# Patient Record
Sex: Male | Born: 1950
Health system: Southern US, Community
[De-identification: ages and names within clinical notes are randomized; demographics above are authoritative.]

## PROBLEM LIST (undated history)

## (undated) DIAGNOSIS — K219 Gastro-esophageal reflux disease without esophagitis: Secondary | ICD-10-CM

## (undated) DIAGNOSIS — Z923 Personal history of irradiation: Secondary | ICD-10-CM

## (undated) DIAGNOSIS — J189 Pneumonia, unspecified organism: Secondary | ICD-10-CM

## (undated) DIAGNOSIS — F419 Anxiety disorder, unspecified: Secondary | ICD-10-CM

## (undated) DIAGNOSIS — M1990 Unspecified osteoarthritis, unspecified site: Secondary | ICD-10-CM

## (undated) DIAGNOSIS — D531 Other megaloblastic anemias, not elsewhere classified: Secondary | ICD-10-CM

## (undated) DIAGNOSIS — C801 Malignant (primary) neoplasm, unspecified: Secondary | ICD-10-CM

## (undated) DIAGNOSIS — R0602 Shortness of breath: Secondary | ICD-10-CM

## (undated) DIAGNOSIS — J439 Emphysema, unspecified: Secondary | ICD-10-CM

## (undated) DIAGNOSIS — Z9289 Personal history of other medical treatment: Secondary | ICD-10-CM

## (undated) DIAGNOSIS — R569 Unspecified convulsions: Secondary | ICD-10-CM

## (undated) DIAGNOSIS — R070 Pain in throat: Secondary | ICD-10-CM

## (undated) HISTORY — DX: Other megaloblastic anemias, not elsewhere classified: D53.1

## (undated) HISTORY — DX: Emphysema, unspecified: J43.9

## (undated) HISTORY — PX: ABDOMINAL HERNIA REPAIR: SHX539

## (undated) HISTORY — DX: Personal history of irradiation: Z92.3

## (undated) HISTORY — DX: Unspecified convulsions: R56.9

## (undated) HISTORY — PX: COLONOSCOPY: SHX174

## (undated) HISTORY — DX: Pain in throat: R07.0

## (undated) HISTORY — DX: Unspecified osteoarthritis, unspecified site: M19.90

## (undated) HISTORY — DX: Gastro-esophageal reflux disease without esophagitis: K21.9

---

## 1997-09-13 ENCOUNTER — Emergency Department (HOSPITAL_COMMUNITY): Admission: EM | Admit: 1997-09-13 | Discharge: 1997-09-13 | Payer: Self-pay | Admitting: *Deleted

## 1997-11-28 ENCOUNTER — Emergency Department (HOSPITAL_COMMUNITY): Admission: EM | Admit: 1997-11-28 | Discharge: 1997-11-28 | Payer: Self-pay | Admitting: Emergency Medicine

## 1999-11-17 ENCOUNTER — Encounter: Payer: Self-pay | Admitting: Emergency Medicine

## 1999-11-17 ENCOUNTER — Emergency Department (HOSPITAL_COMMUNITY): Admission: EM | Admit: 1999-11-17 | Discharge: 1999-11-17 | Payer: Self-pay | Admitting: Emergency Medicine

## 2000-06-06 ENCOUNTER — Encounter: Payer: Self-pay | Admitting: Emergency Medicine

## 2000-06-06 ENCOUNTER — Emergency Department (HOSPITAL_COMMUNITY): Admission: EM | Admit: 2000-06-06 | Discharge: 2000-06-06 | Payer: Self-pay | Admitting: Emergency Medicine

## 2001-02-14 ENCOUNTER — Emergency Department (HOSPITAL_COMMUNITY): Admission: EM | Admit: 2001-02-14 | Discharge: 2001-02-14 | Payer: Self-pay | Admitting: Emergency Medicine

## 2001-10-29 ENCOUNTER — Emergency Department (HOSPITAL_COMMUNITY): Admission: EM | Admit: 2001-10-29 | Discharge: 2001-10-29 | Payer: Self-pay | Admitting: Emergency Medicine

## 2002-06-27 ENCOUNTER — Emergency Department (HOSPITAL_COMMUNITY): Admission: EM | Admit: 2002-06-27 | Discharge: 2002-06-27 | Payer: Self-pay | Admitting: Emergency Medicine

## 2002-10-10 ENCOUNTER — Emergency Department (HOSPITAL_COMMUNITY): Admission: EM | Admit: 2002-10-10 | Discharge: 2002-10-11 | Payer: Self-pay | Admitting: Emergency Medicine

## 2003-08-13 ENCOUNTER — Encounter: Admission: RE | Admit: 2003-08-13 | Discharge: 2003-08-13 | Payer: Self-pay | Admitting: Family Medicine

## 2004-01-31 ENCOUNTER — Emergency Department (HOSPITAL_COMMUNITY): Admission: EM | Admit: 2004-01-31 | Discharge: 2004-01-31 | Payer: Self-pay | Admitting: Emergency Medicine

## 2004-07-26 ENCOUNTER — Ambulatory Visit: Payer: Self-pay | Admitting: Gastroenterology

## 2004-08-06 ENCOUNTER — Ambulatory Visit: Payer: Self-pay | Admitting: Gastroenterology

## 2004-10-22 ENCOUNTER — Emergency Department (HOSPITAL_COMMUNITY): Admission: EM | Admit: 2004-10-22 | Discharge: 2004-10-22 | Payer: Self-pay | Admitting: Emergency Medicine

## 2005-05-09 ENCOUNTER — Emergency Department (HOSPITAL_COMMUNITY): Admission: EM | Admit: 2005-05-09 | Discharge: 2005-05-09 | Payer: Self-pay | Admitting: Emergency Medicine

## 2005-05-12 ENCOUNTER — Emergency Department (HOSPITAL_COMMUNITY): Admission: EM | Admit: 2005-05-12 | Discharge: 2005-05-13 | Payer: Self-pay | Admitting: Emergency Medicine

## 2005-05-13 ENCOUNTER — Ambulatory Visit: Payer: Self-pay | Admitting: Psychiatry

## 2005-05-13 ENCOUNTER — Inpatient Hospital Stay (HOSPITAL_COMMUNITY): Admission: EM | Admit: 2005-05-13 | Discharge: 2005-05-17 | Payer: Self-pay | Admitting: Psychiatry

## 2005-05-16 ENCOUNTER — Emergency Department (HOSPITAL_COMMUNITY): Admission: EM | Admit: 2005-05-16 | Discharge: 2005-05-16 | Payer: Self-pay | Admitting: Emergency Medicine

## 2006-12-28 ENCOUNTER — Ambulatory Visit: Payer: Self-pay | Admitting: Hematology and Oncology

## 2007-01-24 LAB — CBC & DIFF AND RETIC
Basophils Absolute: 0 10*3/uL (ref 0.0–0.1)
Eosinophils Absolute: 0 10*3/uL (ref 0.0–0.5)
HGB: 9.3 g/dL — ABNORMAL LOW (ref 13.0–17.1)
LYMPH%: 33.5 % (ref 14.0–48.0)
MCV: 113.3 fL — ABNORMAL HIGH (ref 81.6–98.0)
MONO#: 0.6 10*3/uL (ref 0.1–0.9)
MONO%: 16.1 % — ABNORMAL HIGH (ref 0.0–13.0)
NEUT#: 1.9 10*3/uL (ref 1.5–6.5)
Platelets: 369 10*3/uL (ref 145–400)
RBC: 2.28 10*6/uL — ABNORMAL LOW (ref 4.20–5.71)
RDW: 16 % — ABNORMAL HIGH (ref 11.2–14.6)
RETIC #: 14.8 10*3/uL — ABNORMAL LOW (ref 31.8–103.9)
Retic %: 0.7 % (ref 0.7–2.3)
WBC: 3.8 10*3/uL — ABNORMAL LOW (ref 4.0–10.0)

## 2007-01-24 LAB — URINALYSIS, MICROSCOPIC - CHCC
Bilirubin (Urine): NEGATIVE
Ketones: NEGATIVE mg/dL
Protein: NEGATIVE mg/dL
Specific Gravity, Urine: 1.015 (ref 1.003–1.035)
pH: 6.5 (ref 4.6–8.0)

## 2007-01-26 LAB — COMPREHENSIVE METABOLIC PANEL
AST: 13 U/L (ref 0–37)
Albumin: 3.6 g/dL (ref 3.5–5.2)
Alkaline Phosphatase: 167 U/L — ABNORMAL HIGH (ref 39–117)
BUN: 16 mg/dL (ref 6–23)
Calcium: 8.7 mg/dL (ref 8.4–10.5)
Chloride: 110 mEq/L (ref 96–112)
Potassium: 4.9 mEq/L (ref 3.5–5.3)
Sodium: 145 mEq/L (ref 135–145)
Total Protein: 7.5 g/dL (ref 6.0–8.3)

## 2007-01-26 LAB — PROTEIN ELECTROPHORESIS, SERUM
Beta 2: 9.3 % — ABNORMAL HIGH (ref 3.2–6.5)
Beta Globulin: 4.9 % (ref 4.7–7.2)
Gamma Globulin: 18.5 % (ref 11.1–18.8)

## 2007-01-26 LAB — VITAMIN B12: Vitamin B-12: 320 pg/mL (ref 211–911)

## 2007-01-26 LAB — DIRECT ANTIGLOBULIN TEST (NOT AT ARMC)
DAT (Complement): NEGATIVE
DAT IgG: NEGATIVE

## 2007-01-26 LAB — FERRITIN: Ferritin: 431 ng/mL — ABNORMAL HIGH (ref 22–322)

## 2007-01-26 LAB — ERYTHROPOIETIN: Erythropoietin: 50.9 m[IU]/mL — ABNORMAL HIGH (ref 2.6–34.0)

## 2007-01-26 LAB — HEMOGLOBINOPATHY EVALUATION: Hgb F Quant: 0.4 % (ref 0.0–2.0)

## 2007-01-26 LAB — FOLATE: Folate: >20 ng/mL

## 2007-02-01 ENCOUNTER — Other Ambulatory Visit: Admission: RE | Admit: 2007-02-01 | Discharge: 2007-02-01 | Payer: Self-pay | Admitting: Hematology and Oncology

## 2007-02-01 ENCOUNTER — Encounter: Payer: Self-pay | Admitting: Hematology and Oncology

## 2007-02-13 ENCOUNTER — Ambulatory Visit: Payer: Self-pay | Admitting: Hematology and Oncology

## 2007-04-11 ENCOUNTER — Ambulatory Visit: Payer: Self-pay | Admitting: Hematology and Oncology

## 2007-05-08 LAB — CBC WITH DIFFERENTIAL/PLATELET
Basophils Absolute: 0 10*3/uL (ref 0.0–0.1)
Eosinophils Absolute: 0 10*3/uL (ref 0.0–0.5)
HGB: 10.4 g/dL — ABNORMAL LOW (ref 13.0–17.1)
LYMPH%: 25.1 % (ref 14.0–48.0)
MONO#: 0.6 10*3/uL (ref 0.1–0.9)
NEUT#: 2.7 10*3/uL (ref 1.5–6.5)
Platelets: 150 10*3/uL (ref 145–400)
RBC: 2.54 10*6/uL — ABNORMAL LOW (ref 4.20–5.71)
WBC: 4.6 10*3/uL (ref 4.0–10.0)

## 2007-05-08 LAB — COMPREHENSIVE METABOLIC PANEL
Albumin: 4.3 g/dL (ref 3.5–5.2)
BUN: 17 mg/dL (ref 6–23)
CO2: 22 mEq/L (ref 19–32)
Glucose, Bld: 180 mg/dL — ABNORMAL HIGH (ref 70–99)
Potassium: 4.1 mEq/L (ref 3.5–5.3)
Sodium: 137 mEq/L (ref 135–145)
Total Bilirubin: 0.6 mg/dL (ref 0.3–1.2)
Total Protein: 8.1 g/dL (ref 6.0–8.3)

## 2007-05-08 LAB — VITAMIN B12: Vitamin B-12: 420 pg/mL (ref 211–911)

## 2007-05-31 ENCOUNTER — Ambulatory Visit: Payer: Self-pay | Admitting: Hematology and Oncology

## 2007-06-05 LAB — CBC WITH DIFFERENTIAL/PLATELET
BASO%: 0 % (ref 0.0–2.0)
EOS%: 1.6 % (ref 0.0–7.0)
MCH: 40.5 pg — ABNORMAL HIGH (ref 28.0–33.4)
MCV: 115.9 fL — ABNORMAL HIGH (ref 81.6–98.0)
MONO%: 12 % (ref 0.0–13.0)
RBC: 2.7 10*6/uL — ABNORMAL LOW (ref 4.20–5.71)
RDW: 16.6 % — ABNORMAL HIGH (ref 11.2–14.6)
lymph#: 1 10*3/uL (ref 0.9–3.3)

## 2007-07-03 LAB — CBC WITH DIFFERENTIAL/PLATELET
Basophils Absolute: 0 10*3/uL (ref 0.0–0.1)
Eosinophils Absolute: 0 10*3/uL (ref 0.0–0.5)
HGB: 11.8 g/dL — ABNORMAL LOW (ref 13.0–17.1)
LYMPH%: 40.5 % (ref 14.0–48.0)
MCV: 115.1 fL — ABNORMAL HIGH (ref 81.6–98.0)
MONO#: 1 10*3/uL — ABNORMAL HIGH (ref 0.1–0.9)
MONO%: 29.8 % — ABNORMAL HIGH (ref 0.0–13.0)
NEUT#: 0.9 10*3/uL — ABNORMAL LOW (ref 1.5–6.5)
Platelets: 163 10*3/uL (ref 145–400)
RBC: 2.94 10*6/uL — ABNORMAL LOW (ref 4.20–5.71)
RDW: 15.7 % — ABNORMAL HIGH (ref 11.2–14.6)
WBC: 3.3 10*3/uL — ABNORMAL LOW (ref 4.0–10.0)

## 2007-07-10 LAB — CBC WITH DIFFERENTIAL/PLATELET
BASO%: 1.5 % (ref 0.0–2.0)
Basophils Absolute: 0.1 10e3/uL (ref 0.0–0.1)
EOS%: 1.7 % (ref 0.0–7.0)
Eosinophils Absolute: 0.1 10e3/uL (ref 0.0–0.5)
HCT: 36.7 % — ABNORMAL LOW (ref 38.7–49.9)
HGB: 12.9 g/dL — ABNORMAL LOW (ref 13.0–17.1)
LYMPH%: 42.8 % (ref 14.0–48.0)
MCH: 40.4 pg — ABNORMAL HIGH (ref 28.0–33.4)
MCHC: 35.3 g/dL (ref 32.0–35.9)
MCV: 114.7 fL — ABNORMAL HIGH (ref 81.6–98.0)
MONO#: 0.5 10e3/uL (ref 0.1–0.9)
MONO%: 11.8 % (ref 0.0–13.0)
NEUT#: 1.7 10e3/uL (ref 1.5–6.5)
NEUT%: 42.2 % (ref 40.0–75.0)
Platelets: 265 10e3/uL (ref 145–400)
RBC: 3.2 10e6/uL — ABNORMAL LOW (ref 4.20–5.71)
RDW: 15.8 % — ABNORMAL HIGH (ref 11.2–14.6)
WBC: 4 10e3/uL (ref 4.0–10.0)
lymph#: 1.7 10e3/uL (ref 0.9–3.3)

## 2007-07-27 ENCOUNTER — Ambulatory Visit: Payer: Self-pay | Admitting: Hematology and Oncology

## 2007-08-28 LAB — CBC WITH DIFFERENTIAL/PLATELET
BASO%: 0.6 % (ref 0.0–2.0)
EOS%: 2.9 % (ref 0.0–7.0)
HGB: 11.9 g/dL — ABNORMAL LOW (ref 13.0–17.1)
MCH: 40.6 pg — ABNORMAL HIGH (ref 28.0–33.4)
MCHC: 35.4 g/dL (ref 32.0–35.9)
MCV: 114.6 fL — ABNORMAL HIGH (ref 81.6–98.0)
MONO%: 16.5 % — ABNORMAL HIGH (ref 0.0–13.0)
RBC: 2.93 10*6/uL — ABNORMAL LOW (ref 4.20–5.71)
RDW: 16.4 % — ABNORMAL HIGH (ref 11.2–14.6)
lymph#: 1.6 10*3/uL (ref 0.9–3.3)

## 2007-09-21 ENCOUNTER — Ambulatory Visit: Payer: Self-pay | Admitting: Hematology and Oncology

## 2007-10-23 LAB — CBC WITH DIFFERENTIAL/PLATELET
BASO%: 0.2 % (ref 0.0–2.0)
Basophils Absolute: 0 10*3/uL (ref 0.0–0.1)
HCT: 29.3 % — ABNORMAL LOW (ref 38.7–49.9)
HGB: 10.2 g/dL — ABNORMAL LOW (ref 13.0–17.1)
LYMPH%: 27.8 % (ref 14.0–48.0)
MCH: 39.7 pg — ABNORMAL HIGH (ref 28.0–33.4)
MCHC: 35 g/dL (ref 32.0–35.9)
MONO#: 0.7 10*3/uL (ref 0.1–0.9)
NEUT%: 52.1 % (ref 40.0–75.0)
Platelets: 149 10*3/uL (ref 145–400)
WBC: 3.8 10*3/uL — ABNORMAL LOW (ref 4.0–10.0)

## 2007-10-23 LAB — BASIC METABOLIC PANEL
BUN: 19 mg/dL (ref 6–23)
CO2: 22 mEq/L (ref 19–32)
Calcium: 8.8 mg/dL (ref 8.4–10.5)
Creatinine, Ser: 0.85 mg/dL (ref 0.40–1.50)
Glucose, Bld: 98 mg/dL (ref 70–99)

## 2007-11-15 ENCOUNTER — Ambulatory Visit: Payer: Self-pay | Admitting: Hematology and Oncology

## 2007-11-20 LAB — CBC WITH DIFFERENTIAL/PLATELET
Eosinophils Absolute: 0.1 10*3/uL (ref 0.0–0.5)
HCT: 29.6 % — ABNORMAL LOW (ref 38.7–49.9)
LYMPH%: 18.1 % (ref 14.0–48.0)
MONO#: 0.6 10*3/uL (ref 0.1–0.9)
NEUT#: 3 10*3/uL (ref 1.5–6.5)
NEUT%: 66.4 % (ref 40.0–75.0)
Platelets: 158 10*3/uL (ref 145–400)
WBC: 4.5 10*3/uL (ref 4.0–10.0)

## 2008-01-09 ENCOUNTER — Ambulatory Visit: Payer: Self-pay | Admitting: Hematology and Oncology

## 2008-01-15 LAB — CBC WITH DIFFERENTIAL/PLATELET
BASO%: 1.8 % (ref 0.0–2.0)
Eosinophils Absolute: 0 10*3/uL (ref 0.0–0.5)
LYMPH%: 18 % (ref 14.0–48.0)
MCHC: 35.2 g/dL (ref 32.0–35.9)
MONO#: 0.7 10*3/uL (ref 0.1–0.9)
NEUT#: 2.6 10*3/uL (ref 1.5–6.5)
Platelets: 188 10*3/uL (ref 145–400)
RBC: 2.81 10*6/uL — ABNORMAL LOW (ref 4.20–5.71)
RDW: 17 % — ABNORMAL HIGH (ref 11.2–14.6)
WBC: 4.1 10*3/uL (ref 4.0–10.0)

## 2008-01-15 LAB — BASIC METABOLIC PANEL
CO2: 24 mEq/L (ref 19–32)
Calcium: 9 mg/dL (ref 8.4–10.5)
Glucose, Bld: 102 mg/dL — ABNORMAL HIGH (ref 70–99)
Potassium: 4.7 mEq/L (ref 3.5–5.3)
Sodium: 136 mEq/L (ref 135–145)

## 2008-02-12 LAB — CBC WITH DIFFERENTIAL/PLATELET
BASO%: 0.7 % (ref 0.0–2.0)
EOS%: 1.1 % (ref 0.0–7.0)
LYMPH%: 27.5 % (ref 14.0–48.0)
MCH: 42.5 pg — ABNORMAL HIGH (ref 28.0–33.4)
MCHC: 35.1 g/dL (ref 32.0–35.9)
MCV: 121.2 fL — ABNORMAL HIGH (ref 81.6–98.0)
MONO%: 16.6 % — ABNORMAL HIGH (ref 0.0–13.0)
NEUT#: 2.2 10*3/uL (ref 1.5–6.5)
Platelets: 194 10*3/uL (ref 145–400)
RBC: 2.76 10*6/uL — ABNORMAL LOW (ref 4.20–5.71)
RDW: 16.1 % — ABNORMAL HIGH (ref 11.2–14.6)

## 2008-03-07 ENCOUNTER — Ambulatory Visit: Payer: Self-pay | Admitting: Hematology and Oncology

## 2008-03-11 LAB — CBC WITH DIFFERENTIAL/PLATELET
Basophils Absolute: 0 10*3/uL (ref 0.0–0.1)
EOS%: 1.5 % (ref 0.0–7.0)
Eosinophils Absolute: 0.1 10*3/uL (ref 0.0–0.5)
HGB: 11.8 g/dL — ABNORMAL LOW (ref 13.0–17.1)
LYMPH%: 33.9 % (ref 14.0–48.0)
MCH: 42 pg — ABNORMAL HIGH (ref 28.0–33.4)
MCV: 119 fL — ABNORMAL HIGH (ref 81.6–98.0)
MONO%: 15.3 % — ABNORMAL HIGH (ref 0.0–13.0)
NEUT%: 49 % (ref 40.0–75.0)
Platelets: 175 10*3/uL (ref 145–400)
RDW: 16.1 % — ABNORMAL HIGH (ref 11.2–14.6)

## 2008-04-08 LAB — CBC WITH DIFFERENTIAL/PLATELET
Basophils Absolute: 0 10*3/uL (ref 0.0–0.1)
Eosinophils Absolute: 0.1 10*3/uL (ref 0.0–0.5)
HGB: 11.6 g/dL — ABNORMAL LOW (ref 13.0–17.1)
LYMPH%: 29.5 % (ref 14.0–48.0)
MCV: 118.4 fL — ABNORMAL HIGH (ref 81.6–98.0)
MONO%: 20.7 % — ABNORMAL HIGH (ref 0.0–13.0)
NEUT#: 1.5 10*3/uL (ref 1.5–6.5)
Platelets: 142 10*3/uL — ABNORMAL LOW (ref 145–400)

## 2008-04-08 LAB — BASIC METABOLIC PANEL
BUN: 26 mg/dL — ABNORMAL HIGH (ref 6–23)
CO2: 23 mEq/L (ref 19–32)
Calcium: 9.1 mg/dL (ref 8.4–10.5)
Creatinine, Ser: 0.88 mg/dL (ref 0.40–1.50)
Glucose, Bld: 101 mg/dL — ABNORMAL HIGH (ref 70–99)

## 2008-04-30 ENCOUNTER — Ambulatory Visit: Payer: Self-pay | Admitting: Hematology and Oncology

## 2008-05-06 LAB — CBC WITH DIFFERENTIAL/PLATELET
Basophils Absolute: 0 10*3/uL (ref 0.0–0.1)
EOS%: 0.9 % (ref 0.0–7.0)
Eosinophils Absolute: 0 10*3/uL (ref 0.0–0.5)
HCT: 32.5 % — ABNORMAL LOW (ref 38.7–49.9)
HGB: 11.4 g/dL — ABNORMAL LOW (ref 13.0–17.1)
MCH: 41.3 pg — ABNORMAL HIGH (ref 28.0–33.4)
MCV: 118.4 fL — ABNORMAL HIGH (ref 81.6–98.0)
NEUT#: 2.6 10*3/uL (ref 1.5–6.5)
NEUT%: 57.2 % (ref 40.0–75.0)
lymph#: 1.3 10*3/uL (ref 0.9–3.3)

## 2008-06-26 ENCOUNTER — Encounter: Admission: RE | Admit: 2008-06-26 | Discharge: 2008-06-26 | Payer: Self-pay | Admitting: Family Medicine

## 2008-06-27 ENCOUNTER — Ambulatory Visit: Payer: Self-pay | Admitting: Hematology and Oncology

## 2008-07-01 LAB — CBC WITH DIFFERENTIAL/PLATELET
Basophils Absolute: 0 10*3/uL (ref 0.0–0.1)
EOS%: 0.8 % (ref 0.0–7.0)
HCT: 27.3 % — ABNORMAL LOW (ref 38.4–49.9)
HGB: 9.4 g/dL — ABNORMAL LOW (ref 13.0–17.1)
LYMPH%: 31.4 % (ref 14.0–49.0)
MCH: 39.3 pg — ABNORMAL HIGH (ref 27.2–33.4)
MONO#: 0.5 10*3/uL (ref 0.1–0.9)
NEUT%: 52.7 % (ref 39.0–75.0)
Platelets: 265 10*3/uL (ref 140–400)
lymph#: 1.2 10*3/uL (ref 0.9–3.3)

## 2008-08-26 ENCOUNTER — Ambulatory Visit: Payer: Self-pay | Admitting: Hematology and Oncology

## 2008-08-28 LAB — CBC WITH DIFFERENTIAL/PLATELET
BASO%: 0.4 % (ref 0.0–2.0)
EOS%: 1.3 % (ref 0.0–7.0)
MCH: 42 pg — ABNORMAL HIGH (ref 27.2–33.4)
MCHC: 35.2 g/dL (ref 32.0–36.0)
MONO%: 15.1 % — ABNORMAL HIGH (ref 0.0–14.0)
NEUT%: 55.1 % (ref 39.0–75.0)
RDW: 16.6 % — ABNORMAL HIGH (ref 11.0–14.6)
lymph#: 1.3 10*3/uL (ref 0.9–3.3)

## 2008-08-28 LAB — BASIC METABOLIC PANEL
Chloride: 104 mEq/L (ref 96–112)
Creatinine, Ser: 0.69 mg/dL (ref 0.40–1.50)
Potassium: 4.4 mEq/L (ref 3.5–5.3)

## 2008-08-28 LAB — VITAMIN B12: Vitamin B-12: 444 pg/mL (ref 211–911)

## 2008-09-24 LAB — CBC WITH DIFFERENTIAL/PLATELET
BASO%: 0.7 % (ref 0.0–2.0)
EOS%: 2.2 % (ref 0.0–7.0)
HCT: 27.7 % — ABNORMAL LOW (ref 38.4–49.9)
LYMPH%: 21.2 % (ref 14.0–49.0)
MCH: 41.4 pg — ABNORMAL HIGH (ref 27.2–33.4)
MCHC: 34.2 g/dL (ref 32.0–36.0)
MCV: 121.1 fL — ABNORMAL HIGH (ref 79.3–98.0)
NEUT%: 63.7 % (ref 39.0–75.0)
Platelets: 202 10*3/uL (ref 140–400)

## 2008-10-20 ENCOUNTER — Ambulatory Visit: Payer: Self-pay | Admitting: Hematology and Oncology

## 2008-10-22 LAB — CBC WITH DIFFERENTIAL/PLATELET
Basophils Absolute: 0 10*3/uL (ref 0.0–0.1)
Eosinophils Absolute: 0.1 10*3/uL (ref 0.0–0.5)
LYMPH%: 26.7 % (ref 14.0–49.0)
MCV: 116 fL — ABNORMAL HIGH (ref 79.3–98.0)
MONO%: 14.2 % — ABNORMAL HIGH (ref 0.0–14.0)
NEUT#: 2.8 10*3/uL (ref 1.5–6.5)
Platelets: 169 10*3/uL (ref 140–400)
RBC: 2.38 10*6/uL — ABNORMAL LOW (ref 4.20–5.82)

## 2008-11-17 ENCOUNTER — Ambulatory Visit: Payer: Self-pay | Admitting: Hematology and Oncology

## 2008-11-19 LAB — CBC WITH DIFFERENTIAL/PLATELET
Basophils Absolute: 0 10*3/uL (ref 0.0–0.1)
Eosinophils Absolute: 0 10*3/uL (ref 0.0–0.5)
HGB: 8.8 g/dL — ABNORMAL LOW (ref 13.0–17.1)
MCV: 115.1 fL — ABNORMAL HIGH (ref 79.3–98.0)
MONO%: 14.1 % — ABNORMAL HIGH (ref 0.0–14.0)
NEUT#: 2 10*3/uL (ref 1.5–6.5)
Platelets: 131 10*3/uL — ABNORMAL LOW (ref 140–400)
RDW: 15.8 % — ABNORMAL HIGH (ref 11.0–14.6)

## 2008-12-15 ENCOUNTER — Ambulatory Visit: Payer: Self-pay | Admitting: Hematology and Oncology

## 2008-12-17 LAB — CBC WITH DIFFERENTIAL/PLATELET
Basophils Absolute: 0 10*3/uL (ref 0.0–0.1)
Eosinophils Absolute: 0 10*3/uL (ref 0.0–0.5)
HCT: 27.2 % — ABNORMAL LOW (ref 38.4–49.9)
HGB: 9.5 g/dL — ABNORMAL LOW (ref 13.0–17.1)
LYMPH%: 20.3 % (ref 14.0–49.0)
MCV: 121.3 fL — ABNORMAL HIGH (ref 79.3–98.0)
MONO%: 16.4 % — ABNORMAL HIGH (ref 0.0–14.0)
NEUT#: 2.4 10*3/uL (ref 1.5–6.5)
Platelets: 220 10*3/uL (ref 140–400)

## 2008-12-17 LAB — VITAMIN B12: Vitamin B-12: 989 pg/mL — ABNORMAL HIGH (ref 211–911)

## 2008-12-17 LAB — BASIC METABOLIC PANEL
BUN: 18 mg/dL (ref 6–23)
Calcium: 8.8 mg/dL (ref 8.4–10.5)
Glucose, Bld: 134 mg/dL — ABNORMAL HIGH (ref 70–99)
Potassium: 3.8 mEq/L (ref 3.5–5.3)

## 2009-01-14 ENCOUNTER — Ambulatory Visit: Payer: Self-pay | Admitting: Hematology and Oncology

## 2009-01-14 LAB — CBC WITH DIFFERENTIAL/PLATELET
Basophils Absolute: 0 10*3/uL (ref 0.0–0.1)
Eosinophils Absolute: 0.1 10*3/uL (ref 0.0–0.5)
HGB: 9.8 g/dL — ABNORMAL LOW (ref 13.0–17.1)
NEUT#: 2.4 10*3/uL (ref 1.5–6.5)
RDW: 16.1 % — ABNORMAL HIGH (ref 11.0–14.6)
lymph#: 1.8 10*3/uL (ref 0.9–3.3)

## 2009-02-10 LAB — CBC WITH DIFFERENTIAL/PLATELET
Eosinophils Absolute: 0.1 10*3/uL (ref 0.0–0.5)
MCV: 120.5 fL — ABNORMAL HIGH (ref 79.3–98.0)
MONO#: 0.7 10*3/uL (ref 0.1–0.9)
MONO%: 22.7 % — ABNORMAL HIGH (ref 0.0–14.0)
NEUT#: 0.8 10*3/uL — ABNORMAL LOW (ref 1.5–6.5)
RBC: 2.24 10*6/uL — ABNORMAL LOW (ref 4.20–5.82)
RDW: 15.8 % — ABNORMAL HIGH (ref 11.0–14.6)
WBC: 2.9 10*3/uL — ABNORMAL LOW (ref 4.0–10.3)
lymph#: 1.4 10*3/uL (ref 0.9–3.3)
nRBC: 0 % (ref 0–0)

## 2009-02-13 ENCOUNTER — Ambulatory Visit: Payer: Self-pay | Admitting: Oncology

## 2009-02-17 LAB — CBC WITH DIFFERENTIAL/PLATELET
BASO%: 0.6 % (ref 0.0–2.0)
Basophils Absolute: 0 10*3/uL (ref 0.0–0.1)
EOS%: 0.9 % (ref 0.0–7.0)
Eosinophils Absolute: 0 10*3/uL (ref 0.0–0.5)
HCT: 27.7 % — ABNORMAL LOW (ref 38.4–49.9)
HGB: 9.6 g/dL — ABNORMAL LOW (ref 13.0–17.1)
LYMPH%: 38.5 % (ref 14.0–49.0)
MCH: 41.2 pg — ABNORMAL HIGH (ref 27.2–33.4)
MCHC: 34.7 g/dL (ref 32.0–36.0)
MCV: 118.9 fL — ABNORMAL HIGH (ref 79.3–98.0)
MONO#: 0.5 10*3/uL (ref 0.1–0.9)
MONO%: 15.5 % — ABNORMAL HIGH (ref 0.0–14.0)
NEUT#: 1.5 10*3/uL (ref 1.5–6.5)
NEUT%: 44.5 % (ref 39.0–75.0)
Platelets: 151 10*3/uL (ref 140–400)
RBC: 2.33 10*6/uL — ABNORMAL LOW (ref 4.20–5.82)
RDW: 15.4 % — ABNORMAL HIGH (ref 11.0–14.6)
WBC: 3.4 10*3/uL — ABNORMAL LOW (ref 4.0–10.3)
lymph#: 1.3 10*3/uL (ref 0.9–3.3)
nRBC: 0 % (ref 0–0)

## 2009-03-10 LAB — CBC WITH DIFFERENTIAL/PLATELET
BASO%: 0.9 % (ref 0.0–2.0)
EOS%: 1.5 % (ref 0.0–7.0)
MCH: 41.7 pg — ABNORMAL HIGH (ref 27.2–33.4)
MCHC: 34.5 g/dL (ref 32.0–36.0)
MCV: 121.1 fL — ABNORMAL HIGH (ref 79.3–98.0)
MONO%: 25 % — ABNORMAL HIGH (ref 0.0–14.0)
RBC: 2.42 10*6/uL — ABNORMAL LOW (ref 4.20–5.82)
RDW: 15.7 % — ABNORMAL HIGH (ref 11.0–14.6)
lymph#: 1.5 10*3/uL (ref 0.9–3.3)

## 2009-04-03 ENCOUNTER — Ambulatory Visit: Payer: Self-pay | Admitting: Oncology

## 2009-04-09 LAB — CBC WITH DIFFERENTIAL/PLATELET
BASO%: 0.3 % (ref 0.0–2.0)
Eosinophils Absolute: 0.1 10*3/uL (ref 0.0–0.5)
HCT: 29.1 % — ABNORMAL LOW (ref 38.4–49.9)
LYMPH%: 40.9 % (ref 14.0–49.0)
MCHC: 34.4 g/dL (ref 32.0–36.0)
MCV: 120.2 fL — ABNORMAL HIGH (ref 79.3–98.0)
MONO#: 0.9 10*3/uL (ref 0.1–0.9)
MONO%: 24.6 % — ABNORMAL HIGH (ref 0.0–14.0)
NEUT%: 32.8 % — ABNORMAL LOW (ref 39.0–75.0)
Platelets: 185 10*3/uL (ref 140–400)
RBC: 2.42 10*6/uL — ABNORMAL LOW (ref 4.20–5.82)
WBC: 3.6 10*3/uL — ABNORMAL LOW (ref 4.0–10.3)

## 2009-04-30 ENCOUNTER — Ambulatory Visit: Payer: Self-pay | Admitting: Oncology

## 2009-05-01 ENCOUNTER — Emergency Department (HOSPITAL_COMMUNITY): Admission: EM | Admit: 2009-05-01 | Discharge: 2009-05-01 | Payer: Self-pay | Admitting: Emergency Medicine

## 2009-05-05 LAB — CBC WITH DIFFERENTIAL/PLATELET
BASO%: 0.3 % (ref 0.0–2.0)
EOS%: 1.8 % (ref 0.0–7.0)
HCT: 28.3 % — ABNORMAL LOW (ref 38.4–49.9)
LYMPH%: 43.2 % (ref 14.0–49.0)
MCH: 41.3 pg — ABNORMAL HIGH (ref 27.2–33.4)
MCHC: 34.3 g/dL (ref 32.0–36.0)
NEUT%: 38.8 % — ABNORMAL LOW (ref 39.0–75.0)
Platelets: 123 10*3/uL — ABNORMAL LOW (ref 140–400)
RBC: 2.35 10*6/uL — ABNORMAL LOW (ref 4.20–5.82)
WBC: 3.3 10*3/uL — ABNORMAL LOW (ref 4.0–10.3)
nRBC: 0 % (ref 0–0)

## 2009-06-02 ENCOUNTER — Ambulatory Visit: Payer: Self-pay | Admitting: Oncology

## 2009-06-02 LAB — CBC WITH DIFFERENTIAL/PLATELET
Basophils Absolute: 0 10*3/uL (ref 0.0–0.1)
Eosinophils Absolute: 0.1 10*3/uL (ref 0.0–0.5)
HCT: 26.7 % — ABNORMAL LOW (ref 38.4–49.9)
HGB: 9 g/dL — ABNORMAL LOW (ref 13.0–17.1)
LYMPH%: 32.2 % (ref 14.0–49.0)
MCV: 121.9 fL — ABNORMAL HIGH (ref 79.3–98.0)
MONO#: 0.8 10*3/uL (ref 0.1–0.9)
MONO%: 17.9 % — ABNORMAL HIGH (ref 0.0–14.0)
NEUT#: 2 10*3/uL (ref 1.5–6.5)
NEUT%: 47.8 % (ref 39.0–75.0)
WBC: 4.3 10*3/uL (ref 4.0–10.3)
lymph#: 1.4 10*3/uL (ref 0.9–3.3)

## 2009-06-17 ENCOUNTER — Encounter (INDEPENDENT_AMBULATORY_CARE_PROVIDER_SITE_OTHER): Payer: Self-pay | Admitting: *Deleted

## 2009-06-30 ENCOUNTER — Encounter: Payer: Self-pay | Admitting: Gastroenterology

## 2009-06-30 LAB — CBC WITH DIFFERENTIAL/PLATELET
BASO%: 0.5 % (ref 0.0–2.0)
Basophils Absolute: 0 10*3/uL (ref 0.0–0.1)
Eosinophils Absolute: 0 10*3/uL (ref 0.0–0.5)
HGB: 8.5 g/dL — ABNORMAL LOW (ref 13.0–17.1)
LYMPH%: 31.4 % (ref 14.0–49.0)
MCV: 126.5 fL — ABNORMAL HIGH (ref 79.3–98.0)
MONO#: 0.6 10*3/uL (ref 0.1–0.9)
RDW: 17.4 % — ABNORMAL HIGH (ref 11.0–14.6)
WBC: 3.9 10*3/uL — ABNORMAL LOW (ref 4.0–10.3)
lymph#: 1.2 10*3/uL (ref 0.9–3.3)

## 2009-06-30 LAB — VITAMIN B12: Vitamin B-12: 687 pg/mL (ref 211–911)

## 2009-06-30 LAB — BASIC METABOLIC PANEL
Calcium: 8.8 mg/dL (ref 8.4–10.5)
Creatinine, Ser: 0.82 mg/dL (ref 0.40–1.50)
Glucose, Bld: 93 mg/dL (ref 70–99)

## 2009-07-07 ENCOUNTER — Encounter (INDEPENDENT_AMBULATORY_CARE_PROVIDER_SITE_OTHER): Payer: Self-pay | Admitting: *Deleted

## 2009-07-17 ENCOUNTER — Ambulatory Visit: Payer: Self-pay | Admitting: Oncology

## 2009-07-21 ENCOUNTER — Encounter (HOSPITAL_COMMUNITY): Admission: RE | Admit: 2009-07-21 | Discharge: 2009-10-19 | Payer: Self-pay | Admitting: Hematology and Oncology

## 2009-07-21 LAB — CBC WITH DIFFERENTIAL/PLATELET
BASO%: 0.6 % (ref 0.0–2.0)
Basophils Absolute: 0 10*3/uL (ref 0.0–0.1)
Eosinophils Absolute: 0 10*3/uL (ref 0.0–0.5)
HGB: 7.7 g/dL — ABNORMAL LOW (ref 13.0–17.1)
MCH: 41.6 pg — ABNORMAL HIGH (ref 27.2–33.4)
MCHC: 33.9 g/dL (ref 32.0–36.0)
MCV: 122.7 fL — ABNORMAL HIGH (ref 79.3–98.0)
MONO#: 0.7 10*3/uL (ref 0.1–0.9)
MONO%: 20.6 % — ABNORMAL HIGH (ref 0.0–14.0)
NEUT#: 1.4 10*3/uL — ABNORMAL LOW (ref 1.5–6.5)
NEUT%: 40.2 % (ref 39.0–75.0)
RBC: 1.85 10*6/uL — ABNORMAL LOW (ref 4.20–5.82)
lymph#: 1.3 10*3/uL (ref 0.9–3.3)
nRBC: 0 % (ref 0–0)

## 2009-07-21 LAB — IRON AND TIBC
%SAT: 40 % (ref 20–55)
TIBC: 221 ug/dL (ref 215–435)
UIBC: 133 ug/dL

## 2009-07-22 LAB — TYPE & CROSSMATCH - CHCC

## 2009-08-07 ENCOUNTER — Encounter (INDEPENDENT_AMBULATORY_CARE_PROVIDER_SITE_OTHER): Payer: Self-pay | Admitting: *Deleted

## 2009-08-11 ENCOUNTER — Ambulatory Visit: Payer: Self-pay | Admitting: Gastroenterology

## 2009-08-11 LAB — CBC WITH DIFFERENTIAL/PLATELET
BASO%: 0.3 % (ref 0.0–2.0)
Basophils Absolute: 0 10*3/uL (ref 0.0–0.1)
EOS%: 0.1 % (ref 0.0–7.0)
Eosinophils Absolute: 0 10*3/uL (ref 0.0–0.5)
HCT: 31.9 % — ABNORMAL LOW (ref 38.4–49.9)
HGB: 11.1 g/dL — ABNORMAL LOW (ref 13.0–17.1)
MCH: 40.7 pg — ABNORMAL HIGH (ref 27.2–33.4)
MCHC: 34.7 g/dL (ref 32.0–36.0)
MCV: 117.2 fL — ABNORMAL HIGH (ref 79.3–98.0)
MONO%: 10.8 % (ref 0.0–14.0)
Platelets: 186 10*3/uL (ref 140–400)
RBC: 2.72 10*6/uL — ABNORMAL LOW (ref 4.20–5.82)
RDW: 27.2 % — ABNORMAL HIGH (ref 11.0–14.6)
WBC: 8.1 10*3/uL (ref 4.0–10.3)

## 2009-08-21 ENCOUNTER — Ambulatory Visit: Payer: Self-pay | Admitting: Oncology

## 2009-08-25 ENCOUNTER — Ambulatory Visit: Payer: Self-pay | Admitting: Gastroenterology

## 2009-08-26 ENCOUNTER — Encounter: Payer: Self-pay | Admitting: Gastroenterology

## 2009-09-01 LAB — CBC WITH DIFFERENTIAL/PLATELET
Basophils Absolute: 0 10*3/uL (ref 0.0–0.1)
Eosinophils Absolute: 0.1 10*3/uL (ref 0.0–0.5)
HCT: 30 % — ABNORMAL LOW (ref 38.4–49.9)
HGB: 10.3 g/dL — ABNORMAL LOW (ref 13.0–17.1)
LYMPH%: 17.9 % (ref 14.0–49.0)
MONO#: 1.2 10*3/uL — ABNORMAL HIGH (ref 0.1–0.9)
NEUT#: 4.4 10*3/uL (ref 1.5–6.5)
RBC: 2.65 10*6/uL — ABNORMAL LOW (ref 4.20–5.82)

## 2009-09-21 ENCOUNTER — Ambulatory Visit: Payer: Self-pay | Admitting: Hematology and Oncology

## 2009-09-22 LAB — CBC WITH DIFFERENTIAL/PLATELET
EOS%: 0.5 % (ref 0.0–7.0)
Eosinophils Absolute: 0 10*3/uL (ref 0.0–0.5)
MCH: 42 pg — ABNORMAL HIGH (ref 27.2–33.4)
MCHC: 35.4 g/dL (ref 32.0–36.0)
MONO#: 1 10*3/uL — ABNORMAL HIGH (ref 0.1–0.9)
NEUT#: 3.4 10*3/uL (ref 1.5–6.5)
RBC: 2.33 10*6/uL — ABNORMAL LOW (ref 4.20–5.82)
RDW: 24.4 % — ABNORMAL HIGH (ref 11.0–14.6)

## 2009-10-13 ENCOUNTER — Encounter: Payer: Self-pay | Admitting: Gastroenterology

## 2009-10-13 LAB — CBC WITH DIFFERENTIAL/PLATELET
BASO%: 0.2 % (ref 0.0–2.0)
HCT: 26 % — ABNORMAL LOW (ref 38.4–49.9)
HGB: 9.2 g/dL — ABNORMAL LOW (ref 13.0–17.1)
MCH: 43.3 pg — ABNORMAL HIGH (ref 27.2–33.4)
MCHC: 35.2 g/dL (ref 32.0–36.0)
NEUT%: 60.1 % (ref 39.0–75.0)
Platelets: 220 10*3/uL (ref 140–400)
RDW: 20.8 % — ABNORMAL HIGH (ref 11.0–14.6)
WBC: 4.4 10*3/uL (ref 4.0–10.3)

## 2009-10-13 LAB — COMPREHENSIVE METABOLIC PANEL
AST: 12 U/L (ref 0–37)
Albumin: 2.9 g/dL — ABNORMAL LOW (ref 3.5–5.2)
Alkaline Phosphatase: 214 U/L — ABNORMAL HIGH (ref 39–117)
BUN: 16 mg/dL (ref 6–23)
Calcium: 8.3 mg/dL — ABNORMAL LOW (ref 8.4–10.5)
Glucose, Bld: 98 mg/dL (ref 70–99)
Potassium: 3.7 mEq/L (ref 3.5–5.3)
Sodium: 136 mEq/L (ref 135–145)
Total Protein: 7.9 g/dL (ref 6.0–8.3)

## 2009-11-03 ENCOUNTER — Ambulatory Visit: Payer: Self-pay | Admitting: Hematology and Oncology

## 2009-11-03 LAB — CBC WITH DIFFERENTIAL/PLATELET
Basophils Absolute: 0 10*3/uL (ref 0.0–0.1)
EOS%: 0.8 % (ref 0.0–7.0)
MCV: 121.9 fL — ABNORMAL HIGH (ref 79.3–98.0)
MONO#: 1.2 10*3/uL — ABNORMAL HIGH (ref 0.1–0.9)
NEUT#: 2.2 10*3/uL (ref 1.5–6.5)
NEUT%: 45 % (ref 39.0–75.0)
RBC: 2.15 10*6/uL — ABNORMAL LOW (ref 4.20–5.82)
RDW: 16.4 % — ABNORMAL HIGH (ref 11.0–14.6)
WBC: 5 10*3/uL (ref 4.0–10.3)
lymph#: 1.4 10*3/uL (ref 0.9–3.3)

## 2009-11-24 LAB — CBC WITH DIFFERENTIAL/PLATELET
Basophils Absolute: 0 10*3/uL (ref 0.0–0.1)
Eosinophils Absolute: 0 10*3/uL (ref 0.0–0.5)
NEUT#: 1.7 10*3/uL (ref 1.5–6.5)
NEUT%: 45.6 % (ref 39.0–75.0)
RBC: 2.06 10*6/uL — ABNORMAL LOW (ref 4.20–5.82)
RDW: 15.6 % — ABNORMAL HIGH (ref 11.0–14.6)
WBC: 3.8 10*3/uL — ABNORMAL LOW (ref 4.0–10.3)
lymph#: 1.1 10*3/uL (ref 0.9–3.3)

## 2009-12-11 ENCOUNTER — Ambulatory Visit: Payer: Self-pay | Admitting: Hematology and Oncology

## 2009-12-15 LAB — CBC WITH DIFFERENTIAL/PLATELET
BASO%: 0.3 % (ref 0.0–2.0)
Eosinophils Absolute: 0 10*3/uL (ref 0.0–0.5)
HCT: 27.3 % — ABNORMAL LOW (ref 38.4–49.9)
LYMPH%: 21.9 % (ref 14.0–49.0)
MCH: 45.1 pg — ABNORMAL HIGH (ref 27.2–33.4)
MCV: 125.9 fL — ABNORMAL HIGH (ref 79.3–98.0)
MONO#: 0.5 10*3/uL (ref 0.1–0.9)
NEUT#: 3.6 10*3/uL (ref 1.5–6.5)
Platelets: 284 10*3/uL (ref 140–400)
RDW: 16.2 % — ABNORMAL HIGH (ref 11.0–14.6)

## 2010-01-05 LAB — CBC WITH DIFFERENTIAL/PLATELET
BASO%: 0.6 % (ref 0.0–2.0)
EOS%: 0.6 % (ref 0.0–7.0)
Eosinophils Absolute: 0 10*3/uL (ref 0.0–0.5)
HCT: 27.8 % — ABNORMAL LOW (ref 38.4–49.9)
HGB: 9.5 g/dL — ABNORMAL LOW (ref 13.0–17.1)
MCV: 123.8 fL — ABNORMAL HIGH (ref 79.3–98.0)
MONO#: 0.7 10*3/uL (ref 0.1–0.9)
MONO%: 11.9 % (ref 0.0–14.0)
NEUT#: 3.9 10*3/uL (ref 1.5–6.5)
WBC: 5.7 10*3/uL (ref 4.0–10.3)
lymph#: 1.1 10*3/uL (ref 0.9–3.3)

## 2010-01-12 ENCOUNTER — Ambulatory Visit: Payer: Self-pay | Admitting: Hematology and Oncology

## 2010-01-12 LAB — CBC WITH DIFFERENTIAL/PLATELET
BASO%: 0.5 % (ref 0.0–2.0)
Basophils Absolute: 0 10*3/uL (ref 0.0–0.1)
EOS%: 0.8 % (ref 0.0–7.0)
HCT: 26.3 % — ABNORMAL LOW (ref 38.4–49.9)
HGB: 8.9 g/dL — ABNORMAL LOW (ref 13.0–17.1)
MCH: 39.7 pg — ABNORMAL HIGH (ref 27.2–33.4)
MCV: 117.4 fL — ABNORMAL HIGH (ref 79.3–98.0)
MONO%: 16.8 % — ABNORMAL HIGH (ref 0.0–14.0)
NEUT%: 57.7 % (ref 39.0–75.0)

## 2010-01-12 LAB — BASIC METABOLIC PANEL
Calcium: 8.4 mg/dL (ref 8.4–10.5)
Glucose, Bld: 116 mg/dL — ABNORMAL HIGH (ref 70–99)
Sodium: 129 mEq/L — ABNORMAL LOW (ref 135–145)

## 2010-01-21 ENCOUNTER — Encounter: Payer: Self-pay | Admitting: Gastroenterology

## 2010-01-21 LAB — CBC WITH DIFFERENTIAL/PLATELET
Basophils Absolute: 0 10*3/uL (ref 0.0–0.1)
Eosinophils Absolute: 0 10*3/uL (ref 0.0–0.5)
HCT: 23.6 % — ABNORMAL LOW (ref 38.4–49.9)
HGB: 7.9 g/dL — ABNORMAL LOW (ref 13.0–17.1)
LYMPH%: 26.6 % (ref 14.0–49.0)
MONO%: 23.4 % — ABNORMAL HIGH (ref 0.0–14.0)
RBC: 1.96 10*6/uL — ABNORMAL LOW (ref 4.20–5.82)
WBC: 4.1 10*3/uL (ref 4.0–10.3)

## 2010-01-21 LAB — IRON AND TIBC: TIBC: 184 ug/dL — ABNORMAL LOW (ref 215–435)

## 2010-01-21 LAB — VITAMIN B12: Vitamin B-12: 1344 pg/mL — ABNORMAL HIGH (ref 211–911)

## 2010-01-26 LAB — CBC WITH DIFFERENTIAL/PLATELET
Basophils Absolute: 0 10*3/uL (ref 0.0–0.1)
EOS%: 0.7 % (ref 0.0–7.0)
Eosinophils Absolute: 0 10*3/uL (ref 0.0–0.5)
HCT: 25.3 % — ABNORMAL LOW (ref 38.4–49.9)
MONO#: 0.6 10*3/uL (ref 0.1–0.9)
MONO%: 13.8 % (ref 0.0–14.0)
NEUT#: 2.3 10*3/uL (ref 1.5–6.5)
Platelets: 287 10*3/uL (ref 140–400)
RDW: 15.1 % — ABNORMAL HIGH (ref 11.0–14.6)

## 2010-02-12 ENCOUNTER — Ambulatory Visit: Payer: Self-pay | Admitting: Hematology and Oncology

## 2010-02-16 LAB — CBC WITH DIFFERENTIAL/PLATELET
BASO%: 0.5 % (ref 0.0–2.0)
EOS%: 1.4 % (ref 0.0–7.0)
HGB: 8.9 g/dL — ABNORMAL LOW (ref 13.0–17.1)
MCH: 39.2 pg — ABNORMAL HIGH (ref 27.2–33.4)
MCHC: 33.5 g/dL (ref 32.0–36.0)
NEUT#: 3.1 10*3/uL (ref 1.5–6.5)
NEUT%: 55.3 % (ref 39.0–75.0)
Platelets: 218 10*3/uL (ref 140–400)
RBC: 2.27 10*6/uL — ABNORMAL LOW (ref 4.20–5.82)
RDW: 16.1 % — ABNORMAL HIGH (ref 11.0–14.6)
lymph#: 1.6 10*3/uL (ref 0.9–3.3)
nRBC: 0 % (ref 0–0)

## 2010-02-16 LAB — VITAMIN B12: Vitamin B-12: 659 pg/mL (ref 211–911)

## 2010-03-10 ENCOUNTER — Encounter: Payer: Self-pay | Admitting: Gastroenterology

## 2010-03-10 LAB — CBC WITH DIFFERENTIAL/PLATELET
Basophils Absolute: 0 10*3/uL (ref 0.0–0.1)
EOS%: 1.7 % (ref 0.0–7.0)
Eosinophils Absolute: 0.1 10*3/uL (ref 0.0–0.5)
HGB: 8.4 g/dL — ABNORMAL LOW (ref 13.0–17.1)
LYMPH%: 34.2 % (ref 14.0–49.0)
MONO#: 0.6 10*3/uL (ref 0.1–0.9)
MONO%: 11.7 % (ref 0.0–14.0)
NEUT#: 2.5 10*3/uL (ref 1.5–6.5)
WBC: 4.8 10*3/uL (ref 4.0–10.3)
lymph#: 1.6 10*3/uL (ref 0.9–3.3)

## 2010-03-10 LAB — BASIC METABOLIC PANEL
BUN: 11 mg/dL (ref 6–23)
CO2: 20 mEq/L (ref 19–32)
Calcium: 8.8 mg/dL (ref 8.4–10.5)
Chloride: 107 mEq/L (ref 96–112)

## 2010-03-29 ENCOUNTER — Ambulatory Visit: Payer: Self-pay | Admitting: Hematology and Oncology

## 2010-03-30 LAB — CBC WITH DIFFERENTIAL/PLATELET
BASO%: 0.2 % (ref 0.0–2.0)
Basophils Absolute: 0 10*3/uL (ref 0.0–0.1)
Eosinophils Absolute: 0 10*3/uL (ref 0.0–0.5)
MCV: 117.1 fL — ABNORMAL HIGH (ref 79.3–98.0)
MONO#: 0.7 10*3/uL (ref 0.1–0.9)
NEUT#: 4.3 10*3/uL (ref 1.5–6.5)
RBC: 2.46 10*6/uL — ABNORMAL LOW (ref 4.20–5.82)
RDW: 17.7 % — ABNORMAL HIGH (ref 11.0–14.6)
WBC: 6.1 10*3/uL (ref 4.0–10.3)

## 2010-04-20 LAB — CBC WITH DIFFERENTIAL/PLATELET
EOS%: 1.4 % (ref 0.0–7.0)
HCT: 26.6 % — ABNORMAL LOW (ref 38.4–49.9)
HGB: 9.2 g/dL — ABNORMAL LOW (ref 13.0–17.1)
MCV: 118.9 fL — ABNORMAL HIGH (ref 79.3–98.0)
MONO%: 12.9 % (ref 0.0–14.0)
NEUT#: 1.8 10*3/uL (ref 1.5–6.5)
NEUT%: 45.1 % (ref 39.0–75.0)
Platelets: 233 10*3/uL (ref 140–400)
RBC: 2.24 10*6/uL — ABNORMAL LOW (ref 4.20–5.82)
WBC: 3.9 10*3/uL — ABNORMAL LOW (ref 4.0–10.3)

## 2010-05-05 ENCOUNTER — Ambulatory Visit: Payer: Self-pay | Admitting: Hematology and Oncology

## 2010-05-11 ENCOUNTER — Encounter: Payer: Self-pay | Admitting: Gastroenterology

## 2010-05-11 ENCOUNTER — Ambulatory Visit: Payer: Self-pay | Admitting: Hematology and Oncology

## 2010-05-11 LAB — CBC WITH DIFFERENTIAL/PLATELET
BASO%: 0.6 % (ref 0.0–2.0)
EOS%: 0.8 % (ref 0.0–7.0)
HCT: 25.9 % — ABNORMAL LOW (ref 38.4–49.9)
MCH: 41.4 pg — ABNORMAL HIGH (ref 27.2–33.4)
NEUT#: 2.7 10*3/uL (ref 1.5–6.5)
Platelets: 199 10*3/uL (ref 140–400)
RBC: 2.23 10*6/uL — ABNORMAL LOW (ref 4.20–5.82)
RDW: 17 % — ABNORMAL HIGH (ref 11.0–14.6)
WBC: 4.4 10*3/uL (ref 4.0–10.3)

## 2010-05-11 LAB — VITAMIN B12: Vitamin B-12: 708 pg/mL (ref 211–911)

## 2010-05-11 LAB — BASIC METABOLIC PANEL
BUN: 15 mg/dL (ref 6–23)
CO2: 20 mEq/L (ref 19–32)
Calcium: 9.1 mg/dL (ref 8.4–10.5)
Chloride: 105 mEq/L (ref 96–112)
Creatinine, Ser: 0.84 mg/dL (ref 0.40–1.50)
Glucose, Bld: 104 mg/dL — ABNORMAL HIGH (ref 70–99)
Potassium: 3.8 mEq/L (ref 3.5–5.3)
Sodium: 136 mEq/L (ref 135–145)

## 2010-06-01 LAB — CBC WITH DIFFERENTIAL/PLATELET
BASO%: 0.5 % (ref 0.0–2.0)
MCHC: 33.9 g/dL (ref 32.0–36.0)
MCV: 116.3 fL — ABNORMAL HIGH (ref 79.3–98.0)
MONO#: 0.5 10*3/uL (ref 0.1–0.9)
NEUT#: 2 10*3/uL (ref 1.5–6.5)
NEUT%: 52.1 % (ref 39.0–75.0)
Platelets: 243 10*3/uL (ref 140–400)
RBC: 2.51 10*6/uL — ABNORMAL LOW (ref 4.20–5.82)
RDW: 18 % — ABNORMAL HIGH (ref 11.0–14.6)
WBC: 3.8 10*3/uL — ABNORMAL LOW (ref 4.0–10.3)
lymph#: 1.2 10*3/uL (ref 0.9–3.3)

## 2010-06-08 NOTE — Letter (Signed)
Summary: Colonoscopy Letter  [facility]  [address] [place], [place] [address]   Phone: [phone]  Fax: [phone]      June 17, 2009 MRN: [id number]   [facility] [address] [place], [place]  [address]   Dear Mr. Higginson,   According to your medical record, it is time for you to schedule a Colonoscopy. The American Cancer Society recommends this procedure as a method to detect early colon cancer. Patients with a family history of colon cancer, or a personal history of colon polyps or inflammatory bowel disease are at increased risk.  This letter has beeen generated based on the recommendations made at the time of your procedure. If you feel that in your particular situation this may no longer apply, please contact our office.  Please call our office at [phone] to schedule this appointment or to update your records at your earliest convenience.  Thank you for cooperating with [place] to provide you with the very best care possible.   Sincerely,  Judie Petit T. Russella Dar, M.D.  [facility] Gastroenterology Division [phone]

## 2010-06-08 NOTE — Letter (Signed)
Summary: [facility] Instructions  [facility]  [address] [place], [place] [address]   Phone: [phone]  Fax: [phone]       Adrian [name]    1950-09-14    MRN: [id number]        Procedure Day /Date: Tuesday 08/25/09     Arrival Time: 10:30am     Procedure Time: 11:30am     Location of Procedure:                    _X _  [facility] (4th Floor)                        PREPARATION FOR COLONOSCOPY WITH MOVIPREP   Starting 5 days prior to your procedure  Thursday 04/14 do not eat nuts, seeds, popcorn, corn, beans, peas,  salads, or any raw vegetables.  Do not take any fiber supplements (e.g. Metamucil, Citrucel, and Benefiber).  THE DAY BEFORE YOUR PROCEDURE         DATE: 04/18   DAY: Monday  1.  Drink clear liquids the entire day-NO SOLID FOOD  2.  Do not drink anything colored red or purple.  Avoid juices with pulp.  No orange juice.  3.  Drink at least 64 oz. (8 glasses) of fluid/clear liquids during the day to prevent dehydration and help the prep work efficiently.  CLEAR LIQUIDS INCLUDE: Water Jello Ice Popsicles Tea (sugar ok, no milk/cream) Powdered fruit flavored drinks Coffee (sugar ok, no milk/cream) Gatorade Juice: apple, white grape, white cranberry  Lemonade Clear bullion, consomm, broth Carbonated beverages (any kind) Strained chicken noodle soup Hard Candy                             4.  In the morning, mix first dose of MoviPrep solution:    Empty 1 Pouch A and 1 Pouch B into the disposable container    Add lukewarm drinking water to the top line of the container. Mix to dissolve    Refrigerate (mixed solution should be used within 24 hrs)  5.  Begin drinking the prep at 5:00 p.m. The MoviPrep container is divided by 4 marks.   Every 15 minutes drink the solution down to the next mark (approximately 8 oz) until the full liter is complete.   6.  Follow completed prep with 16 oz of clear liquid of your choice  (Nothing red or purple).  Continue to drink clear liquids until bedtime.  7.  Before going to bed, mix second dose of MoviPrep solution:    Empty 1 Pouch A and 1 Pouch B into the disposable container    Add lukewarm drinking water to the top line of the container. Mix to dissolve    Refrigerate  THE DAY OF YOUR PROCEDURE      DATE:  04/19  DAY: Tuesday  Beginning at  6:30 a.m. (5 hours before procedure):         1. Every 15 minutes, drink the solution down to the next mark (approx 8 oz) until the full liter is complete.  2. Follow completed prep with 16 oz. of clear liquid of your choice.    3. You may drink clear liquids until 9:30am  (2 HOURS BEFORE PROCEDURE).   MEDICATION INSTRUCTIONS  Unless otherwise instructed, you should take regular prescription medications with a small sip of water   as early as  possible the morning of your procedure.           OTHER INSTRUCTIONS  You will need a responsible adult at least 62 years of age to accompany you and drive you home.   This person must remain in the waiting room during your procedure.  Wear loose fitting clothing that is easily removed.  Leave jewelry and other valuables at home.  However, you may wish to bring a book to read or  an iPod/MP3 player to listen to music as you wait for your procedure to start.  Remove all body piercing jewelry and leave at home.  Total time from sign-in until discharge is approximately 2-3 hours.  You should go home directly after your procedure and rest.  You can resume normal activities the  day after your procedure.  The day of your procedure you should not:   Drive   Make legal decisions   Operate machinery   Drink alcohol   Return to work  You will receive specific instructions about eating, activities and medications before you leave.    The above instructions have been reviewed and explained to me by   Ezra Sites RN  August 11, 2009 10:24 AM    I fully  understand and can verbalize these instructions _____________________________ Date _________

## 2010-06-08 NOTE — Letter (Signed)
[facility]   Imported By: Lennie Odor 11/05/2009 14:39:50  _____________________________________________________________________  External Attachment:    Type:   Image     Comment:   External Document

## 2010-06-08 NOTE — Letter (Signed)
Summary: Previsit letter  [facility]  [address] [place], [place] [address]   Phone: [phone]  Fax: [phone]       07/07/2009 MRN: [id number]  [facility] [address] [place], [place]  [address]  Dear Mr. Nyland,  Welcome to the Gastroenterology Division at [facility]    You are scheduled to see a nurse for your pre-procedure visit on 08/11/2009 at 9:30AM on the 3rd floor at [facility], [address]  We ask that you try to arrive at our office 15 minutes prior to your appointment time to allow for check-in.  Your nurse visit will consist of discussing your medical and surgical history, your immediate family medical history, and your medications.    Please bring a complete list of all your medications or, if you prefer, bring the medication bottles and we will list them.  We will need to be aware of both prescribed and over the counter drugs.  We will need to know exact dosage information as well.  If you are on blood thinners (Coumadin, Plavix, Aggrenox, Ticlid, etc.) please call our office today/prior to your appointment, as we need to consult with your physician about holding your medication.   Please be prepared to read and sign documents such as consent forms, a financial agreement, and acknowledgement forms.  If necessary, and with your consent, a friend or relative is welcome to sit-in on the nurse visit with you.  Please bring your insurance card so that we may make a copy of it.  If your insurance requires a referral to see a specialist, please bring your referral form from your primary care physician.  No co-pay is required for this nurse visit.     If you cannot keep your appointment, please call [phone] to cancel or reschedule prior to your appointment date.  This allows [place] the opportunity to schedule an appointment for another patient in need of care.    Thank you for choosing [facility] for your medical needs.   We appreciate the opportunity to care for you.  Please visit [place] at our website  to learn more about our practice.                     Sincerely.                                                                                                                   The Gastroenterology Division

## 2010-06-08 NOTE — Procedures (Signed)
Summary: Colonoscopy  Patient: Adrian [name] Note: All result statuses are Final unless otherwise noted.  Tests: (1) Colonoscopy (COL)   COL Colonoscopy           DONE     [facility]     [address]     [place], [place]  [address]           COLONOSCOPY PROCEDURE REPORT           PATIENT:  [name], [name]  MR#:  [id number]     BIRTHDATE:  1951-03-17, 58 yrs. old  GENDER:  male           ENDOSCOPIST:  Judie Petit T. Russella Dar, MD, [facility]           PROCEDURE DATE:  08/25/2009     PROCEDURE:  Colonoscopy with snare polypectomy     ASA CLASS:  Class II     INDICATIONS:  1) surveillance and high-risk screening,  follow-up     of polyp, adenomatous polyp, 01/1997.           MEDICATIONS:   Fentanyl 50 mg IV, Versed 8 mg IV           DESCRIPTION OF PROCEDURE:   After the risks benefits and     alternatives of the procedure were thoroughly explained, informed     consent was obtained.  Digital rectal exam was performed and     revealed no abnormalities.   The LB [id number]-[id number] endoscope     was introduced through the anus and advanced to the cecum, which     was identified by both the appendix and ileocecal valve, without     limitations.  The quality of the prep was excellent, using     MoviPrep.  The instrument was then slowly withdrawn as the colon     was fully examined.     <<PROCEDUREIMAGES>>           FINDINGS:  A sessile polyp was found at the hepatic flexure. It     was 6 mm in size. Polyp was snared without cautery. Retrieval was     successful. A sessile polyp was found in the proximal transverse     colon. It was 5 mm in size. Polyp was snared without cautery.     Retrieval was successful. snare polyp  A normal appearing cecum,     ileocecal valve, and appendiceal orifice were identified. The     ascending, splenic flexure, descending, sigmoid colon, and rectum     appeared unremarkable. Retroflexed views in the rectum revealed     internal hemorrhoids, moderate.  The time to cecum =  4.5  minutes.     The scope was then withdrawn (time =  8.25  min) from the patient     and the procedure completed.           COMPLICATIONS:  None           ENDOSCOPIC IMPRESSION:     1) 6 mm sessile polyp at the hepatic flexure     2) 5 mm sessile polyp in the proximal transverse colon     3) Internal hemorrhoids           RECOMMENDATIONS:     1) Await pathology results     2) Repeat Colonoscopy in 5 years.           Venita Lick. Russella Dar, MD, [facility]  CC: Blair Heys, MD    Arlan Organ, MD           n.     Rosalie DoctorVenita Lick. Stark at 08/25/2009 12:00 PM           [place], [place], [id number]  Note: An exclamation mark (!) indicates a result that was not dispersed into the flowsheet. Document Creation Date: 08/25/2009 12:04 PM _______________________________________________________________________  (1) Order result status: Final Collection or observation date-time: 08/25/2009 11:56 Requested date-time:  Receipt date-time:  Reported date-time:  Referring Physician:   Ordering Physician: Claudette Head [phone]) Specimen Source:  Source: Launa Grill Order Number: [id number] Lab site:   Appended Document: Colonoscopy     Procedures Next Due Date:    Colonoscopy: 08/2014

## 2010-06-08 NOTE — Letter (Signed)
[facility]   Imported By: Sherian Rein 07/30/2009 08:07:12  _____________________________________________________________________  External Attachment:    Type:   Image     Comment:   External Document

## 2010-06-08 NOTE — Letter (Signed)
Summary: [facility]  [facility]   Imported By: Sherian Rein 02/04/2010 13:21:36  _____________________________________________________________________  External Attachment:    Type:   Image     Comment:   External Document

## 2010-06-08 NOTE — Letter (Signed)
Summary: Patient Notice- Polyp Results  [facility]  [address] [place], [place] [address]   Phone: [phone]  Fax: [phone]        August 26, 2009 MRN: [id number]    [facility] [address] [place], [place]  [address]    Dear Adrian [name],  I am pleased to inform you that the colon polyp(s) removed during your recent colonoscopy was (were) found to be benign (no cancer detected) upon pathologic examination.  I recommend you have a repeat colonoscopy examination in 5 years to look for recurrent polyps, as having colon polyps increases your risk for having recurrent polyps or even colon cancer in the future.  Should you develop new or worsening symptoms of abdominal pain, bowel habit changes or bleeding from the rectum or bowels, please schedule an evaluation with either your primary care physician or with me.  Continue treatment plan as outlined the day of your exam.  Please call us if you are having persistent problems or have questions about your condition that have not been fully answered at this time.  Sincerely,  Meryl Dare MD [facility]  This letter has been electronically signed by your physician.  Appended Document: Patient Notice- Polyp Results letter mailed 4.25.11

## 2010-06-08 NOTE — Letter (Signed)
Summary: [facility]  [facility]   Imported By: Sherian Rein 03/19/2010 10:15:02  _____________________________________________________________________  External Attachment:    Type:   Image     Comment:   External Document

## 2010-06-08 NOTE — Miscellaneous (Signed)
Summary: [facility]  Clinical Lists Changes  Medications: Added new medication of MOVIPREP 100 GM  SOLR (PEG-KCL-NACL-NASULF-NA ASC-C) As per prep instructions. - Signed Rx of MOVIPREP 100 GM  SOLR (PEG-KCL-NACL-NASULF-NA ASC-C) As per prep instructions.;  #1 x 0;  Signed;  Entered by: Ezra Sites RN;  Authorized by: Meryl Dare MD [facility];  Method used: [facility]  [address] [phone]*, [address] [place], [place], [place]  [id number], Ph: [phone] or [phone], Fax: [phone] Observations: Added new observation of NKA: T (08/11/2009 9:53)    Prescriptions: MOVIPREP 100 GM  SOLR (PEG-KCL-NACL-NASULF-NA ASC-C) As per prep instructions.  #1 x 0   Entered by:   Ezra Sites RN   Authorized by:   Meryl Dare MD [facility]   Signed by:   Ezra Sites RN on 08/11/2009   Method used:   [facility]        [facility]  [address] [phone]* (retail)       [address]       [place], [place]  [id number]       Ph: [phone] or [phone]       Fax: [phone]   RxID:   [id number]

## 2010-06-10 NOTE — Letter (Signed)
Summary: [facility]  [facility]   Imported By: Sherian Rein 06/01/2010 10:57:56  _____________________________________________________________________  External Attachment:    Type:   Image     Comment:   External Document

## 2010-06-22 ENCOUNTER — Encounter (HOSPITAL_BASED_OUTPATIENT_CLINIC_OR_DEPARTMENT_OTHER): Payer: BC Managed Care – PPO | Admitting: Hematology and Oncology

## 2010-06-22 ENCOUNTER — Other Ambulatory Visit: Payer: Self-pay | Admitting: Hematology and Oncology

## 2010-06-22 DIAGNOSIS — D649 Anemia, unspecified: Secondary | ICD-10-CM

## 2010-06-22 DIAGNOSIS — D469 Myelodysplastic syndrome, unspecified: Secondary | ICD-10-CM

## 2010-06-22 LAB — CBC WITH DIFFERENTIAL/PLATELET
Eosinophils Absolute: 0.1 10*3/uL (ref 0.0–0.5)
MCH: 39.1 pg — ABNORMAL HIGH (ref 27.2–33.4)
MONO#: 0.4 10*3/uL (ref 0.1–0.9)
MONO%: 12.3 % (ref 0.0–14.0)
NEUT#: 1.5 10*3/uL (ref 1.5–6.5)
RDW: 18.3 % — ABNORMAL HIGH (ref 11.0–14.6)

## 2010-07-13 ENCOUNTER — Other Ambulatory Visit: Payer: Self-pay | Admitting: Hematology and Oncology

## 2010-07-13 ENCOUNTER — Encounter (HOSPITAL_BASED_OUTPATIENT_CLINIC_OR_DEPARTMENT_OTHER): Payer: BC Managed Care – PPO | Admitting: Hematology and Oncology

## 2010-07-13 DIAGNOSIS — D649 Anemia, unspecified: Secondary | ICD-10-CM

## 2010-07-13 DIAGNOSIS — D469 Myelodysplastic syndrome, unspecified: Secondary | ICD-10-CM

## 2010-07-13 LAB — CBC WITH DIFFERENTIAL/PLATELET
Basophils Absolute: 0 10*3/uL (ref 0.0–0.1)
EOS%: 1.5 % (ref 0.0–7.0)
Eosinophils Absolute: 0.1 10*3/uL (ref 0.0–0.5)
HCT: 31.8 % — ABNORMAL LOW (ref 38.4–49.9)
HGB: 10.8 g/dL — ABNORMAL LOW (ref 13.0–17.1)
MCH: 39.9 pg — ABNORMAL HIGH (ref 27.2–33.4)
MCHC: 34.1 g/dL (ref 32.0–36.0)
NEUT%: 45.4 % (ref 39.0–75.0)
RDW: 18.6 % — ABNORMAL HIGH (ref 11.0–14.6)
lymph#: 1.5 10*3/uL (ref 0.9–3.3)

## 2010-08-01 LAB — CROSSMATCH

## 2010-08-01 LAB — ABO/RH: ABO/RH(D): O POS

## 2010-08-03 ENCOUNTER — Other Ambulatory Visit: Payer: Self-pay | Admitting: Hematology and Oncology

## 2010-08-03 ENCOUNTER — Encounter (HOSPITAL_BASED_OUTPATIENT_CLINIC_OR_DEPARTMENT_OTHER): Payer: BC Managed Care – PPO | Admitting: Hematology and Oncology

## 2010-08-03 DIAGNOSIS — D649 Anemia, unspecified: Secondary | ICD-10-CM

## 2010-08-03 LAB — CBC WITH DIFFERENTIAL/PLATELET
BASO%: 0.6 % (ref 0.0–2.0)
Basophils Absolute: 0 10*3/uL (ref 0.0–0.1)
HCT: 33 % — ABNORMAL LOW (ref 38.4–49.9)
HGB: 11.4 g/dL — ABNORMAL LOW (ref 13.0–17.1)
MCH: 39 pg — ABNORMAL HIGH (ref 27.2–33.4)
MCHC: 34.5 g/dL (ref 32.0–36.0)
MCV: 113 fL — ABNORMAL HIGH (ref 79.3–98.0)
MONO%: 11.8 % (ref 0.0–14.0)
NEUT%: 46.5 % (ref 39.0–75.0)
Platelets: 248 10*3/uL (ref 140–400)
RBC: 2.92 10*6/uL — ABNORMAL LOW (ref 4.20–5.82)
lymph#: 1.4 10*3/uL (ref 0.9–3.3)

## 2010-08-09 LAB — URINE MICROSCOPIC-ADD ON

## 2010-08-09 LAB — COMPREHENSIVE METABOLIC PANEL
Alkaline Phosphatase: 266 U/L — ABNORMAL HIGH (ref 39–117)
BUN: 15 mg/dL (ref 6–23)
Creatinine, Ser: 0.65 mg/dL (ref 0.4–1.5)
Glucose, Bld: 85 mg/dL (ref 70–99)
Potassium: 3.9 mEq/L (ref 3.5–5.1)
Total Bilirubin: 0.8 mg/dL (ref 0.3–1.2)
Total Protein: 8.5 g/dL — ABNORMAL HIGH (ref 6.0–8.3)

## 2010-08-09 LAB — CBC
HCT: 30.7 % — ABNORMAL LOW (ref 39.0–52.0)
Hemoglobin: 10.6 g/dL — ABNORMAL LOW (ref 13.0–17.0)
MCV: 122.4 fL — ABNORMAL HIGH (ref 78.0–100.0)
RDW: 16.1 % — ABNORMAL HIGH (ref 11.5–15.5)

## 2010-08-09 LAB — URINALYSIS, ROUTINE W REFLEX MICROSCOPIC
Bilirubin Urine: NEGATIVE
Glucose, UA: NEGATIVE mg/dL
Specific Gravity, Urine: 1.022 (ref 1.005–1.030)
pH: 6 (ref 5.0–8.0)

## 2010-08-09 LAB — DIFFERENTIAL
Basophils Relative: 0 % (ref 0–1)
Blasts: 0 %
Lymphocytes Relative: 45 % (ref 12–46)
Lymphs Abs: 1.5 10*3/uL (ref 0.7–4.0)
Monocytes Relative: 4 % (ref 3–12)
Neutro Abs: 1.8 10*3/uL (ref 1.7–7.7)
Neutrophils Relative %: 47 % (ref 43–77)
Promyelocytes Absolute: 0 %

## 2010-08-09 LAB — BRAIN NATRIURETIC PEPTIDE: Pro B Natriuretic peptide (BNP): 38.7 pg/mL (ref 0.0–100.0)

## 2010-08-09 LAB — HEMOCCULT GUIAC POC 1CARD (OFFICE): Fecal Occult Bld: NEGATIVE

## 2010-08-24 ENCOUNTER — Other Ambulatory Visit: Payer: Self-pay | Admitting: Hematology and Oncology

## 2010-08-24 ENCOUNTER — Encounter (HOSPITAL_BASED_OUTPATIENT_CLINIC_OR_DEPARTMENT_OTHER): Payer: BC Managed Care – PPO | Admitting: Hematology and Oncology

## 2010-08-24 DIAGNOSIS — D649 Anemia, unspecified: Secondary | ICD-10-CM

## 2010-08-24 DIAGNOSIS — D469 Myelodysplastic syndrome, unspecified: Secondary | ICD-10-CM

## 2010-08-24 LAB — CBC WITH DIFFERENTIAL/PLATELET
Eosinophils Absolute: 0.1 10*3/uL (ref 0.0–0.5)
MCV: 115.5 fL — ABNORMAL HIGH (ref 79.3–98.0)
MONO#: 0.7 10*3/uL (ref 0.1–0.9)
MONO%: 16.6 % — ABNORMAL HIGH (ref 0.0–14.0)
NEUT#: 2.1 10*3/uL (ref 1.5–6.5)
RBC: 2.33 10*6/uL — ABNORMAL LOW (ref 4.20–5.82)
RDW: 16.9 % — ABNORMAL HIGH (ref 11.0–14.6)
WBC: 3.9 10*3/uL — ABNORMAL LOW (ref 4.0–10.3)

## 2010-09-14 ENCOUNTER — Other Ambulatory Visit: Payer: Self-pay | Admitting: Hematology and Oncology

## 2010-09-14 ENCOUNTER — Encounter (HOSPITAL_BASED_OUTPATIENT_CLINIC_OR_DEPARTMENT_OTHER): Payer: BC Managed Care – PPO | Admitting: Hematology and Oncology

## 2010-09-14 DIAGNOSIS — E538 Deficiency of other specified B group vitamins: Secondary | ICD-10-CM

## 2010-09-14 DIAGNOSIS — D469 Myelodysplastic syndrome, unspecified: Secondary | ICD-10-CM

## 2010-09-14 LAB — CBC WITH DIFFERENTIAL/PLATELET
Eosinophils Absolute: 0.1 10*3/uL (ref 0.0–0.5)
LYMPH%: 31.8 % (ref 14.0–49.0)
MONO#: 0.6 10*3/uL (ref 0.1–0.9)
NEUT#: 2.1 10*3/uL (ref 1.5–6.5)
Platelets: 232 10*3/uL (ref 140–400)
RBC: 2.51 10*6/uL — ABNORMAL LOW (ref 4.20–5.82)
WBC: 4 10*3/uL (ref 4.0–10.3)

## 2010-09-14 LAB — BASIC METABOLIC PANEL
CO2: 18 mEq/L — ABNORMAL LOW (ref 19–32)
Calcium: 8.8 mg/dL (ref 8.4–10.5)
Glucose, Bld: 90 mg/dL (ref 70–99)
Potassium: 4.3 mEq/L (ref 3.5–5.3)
Sodium: 137 mEq/L (ref 135–145)

## 2010-09-24 NOTE — Discharge Summary (Signed)
Adrian [name], Adrian [name]              ACCOUNT NO.:  [id number]   MEDICAL RECORD NO.:  [id number]          PATIENT TYPE:  IPS   LOCATION:  0506                          [facility]ACILITY:  [facility]   PHYSICIAN:  Jeanice Lim, M.D. DATE OF BIRTH:  10/07/1950   DATE OF ADMISSION:  05/13/2005  DATE OF DISCHARGE:  05/17/2005                                 DISCHARGE SUMMARY   IDENTIFYING DATA:  This is a 60 year old Caucasian male, married,  voluntarily admitted.  Presenting to the emergency room requesting detox  from alcohol, drinking for 16 years.  Comes home from work and drinks a pint  or more.  Reports he needs to get off of alcohol.  Wife is insisting that he  quit.  He had tried to quit on his own but had a seizure on Monday as per  patient.  Primary care physician had told him that his bone marrow is not  working due to alcohol use.   PAST PSYCHIATRIC HISTORY:  First [facility] admission.  Denied  any previous detox.   PRIMARY CARE PHYSICIAN:  Dr. __________ is the physician.   MEDICAL HISTORY:  History of seizures not otherwise specified, possible  alcohol withdrawal related.   MEDICATIONS:  On Dilantin 100 mg t.i.d., Valium 5 mg p.r.n.   ALLERGIES:  No known drug allergies.   PHYSICAL EXAMINATION:  Physical and neurologic exam essentially within  normal limits.   LABORATORY DATA:  Routine admission labs within normal limits.   MENTAL STATUS EXAM:  Drowsy, cooperative without psychomotor abnormalities.  Speech normal.  Mood euthymic.  Thought processes goal directed.  No  suicidal or homicidal ideation.  Cognitively intact.  Judgment and insight  partial.   ADMISSION DIAGNOSES:  AXIS I:  Alcohol dependence, in partial withdrawal  syndrome.  Rule out substance-induced mood disorder.  AXIS II:  Deferred.  AXIS III:  History of seizure disorder not otherwise specified, macrocytic  anemia, rule out thrombocytopenia secondary to alcohol abuse and  hypokalemia.  AXIS  IV:  Moderate (stressors for marital stress, conflict related to  alcohol use and other psychosocial issues).  AXIS V:  38/64.   HOSPITAL COURSE:  The patient was admitted and ordered routine p.r.n.  medications and underwent further monitoring.  Was encouraged to participate  in individual, group and milieu therapy.  Primary care physician was  contacted regarding coordinating care and for treatment recommendations.  The patient was placed on Librium detox protocol and nutrition supplements  and a family session with wife was requested.  The patient reported feeling  much better after a couple of days, was able to take in some p.o. fluids,  had a significant tremor, diaphoretic, anxious, weak but was less severe on  day #2 of detox.  The patient tolerated detox protocol and patient was  detoxed without complications, doing much better on the day of discharge,  reporting no withdrawal symptoms.  Mood was stable.  Affect full.  No  suicidal ideation, no psychotic symptoms, no acute withdrawal symptoms.  The  patient reported motivation to remain sober and to be compliant with his  aftercare plan and relapse prevention plan.  The patient was given  medication education and was to see Dr. __________ at [facility] on  Friday, May 25, 2005 at 2 p.m.  He was advised not to resume Valium due  to being fully detoxed and that this hits the same receptors that alcohol  does and may increase the risk of seizures if taking this off and on and  definitely will increase the risk of relapsing on alcohol.   DISCHARGE MEDICATIONS:  1.  Folic acid.  2.  Dilantin 100 mg t.i.d. as previously prescribed.  3.  __________ supplement 325 mg daily.  4.  Remeron 15 mg q.h.s.   FOLLOW UP:  The patient was to follow up at the [facility] for chemical  dependency intensive outpatient treatment in the evenings on Thursdays,  starting May 19, 2005 at 11 a.m. for assessment.   DISCHARGE DIAGNOSES:   AXIS I:  Alcohol dependence, in partial withdrawal  syndrome.  Rule out substance-induced mood disorder.  AXIS II:  Deferred.  AXIS III:  History of seizure disorder not otherwise specified, macrocytic  anemia, rule out thrombocytopenia secondary to alcohol abuse and  hypokalemia.  AXIS IV:  Moderate (stressors for marital stress, conflict related to  alcohol use and other psychosocial issues).  AXIS V:  GAF on discharge 55-60.      Jeanice Lim, M.D.  Electronically Signed     JEM/MEDQ  D:  06/11/2005  T:  06/12/2005  Job:  [id number]

## 2010-09-24 NOTE — Consult Note (Signed)
[place]  Patient:    Adrian [name], Adrian [name] Visit Number: [id number] MRN: [id number]          Service Type: EMS Location: [facility] Attending Physician:  Lorre Nick Dictated by:   Jeannett Senior. Pollyann Kennedy, M.D. Proc. Date: 10/29/01 Admit Date:  10/29/2001                            Consultation Report  REASON FOR CONSULTATION:  Complex pinna laceration.  HISTORY OF PRESENT ILLNESS:  This is a 60 year old who had a seizure a couple of hours ago and was found on the floor by his family members with a torn-up right ear.  Medical history is significant for a seizure disorder.  It has been about 25 years since his last seizure.  SOCIAL HISTORY:  He admits to fairly regular drinking and chronic cigarette-smoking.  PRIMARY CARE PHYSICIAN:  Caylor B. Sherene Sires, M.D. [facility]  PHYSICAL EXAMINATION:  GENERAL:  A healthy-appearing gentleman in no distress.  HEENT:  Nose, mouth, oral cavity, pharynx, neck, and left ear examination all completely normal.  Right external ear with complex laceration through the helix and antihelix through-and-through with some slightly torn-up cartilage.  DESCRIPTION OF PROCEDURE:  The ear was locally anesthetized with Xylocaine with epinephrine.  The wound was cleansed using Betadine solution.  No cartilage was trimmed as there was good soft tissue coverage and no evidence of any skin necrosis.  The skin edges were reapproximated with a running 5-0 Prolene suture.  There was good apposition.  There is no loss of cartilage support.  Bacitracin ointment was applied.  He tolerated this well.  He was instructed to keep the ear dry for two days and to follow up with me in one week. Dictated by:   Jeannett Senior Pollyann Kennedy, M.D. Attending Physician:  Lorre Nick DD:  10/29/01 TD:  10/29/01 Job: [id number]

## 2010-10-05 ENCOUNTER — Other Ambulatory Visit: Payer: Self-pay | Admitting: Hematology and Oncology

## 2010-10-05 ENCOUNTER — Encounter (HOSPITAL_BASED_OUTPATIENT_CLINIC_OR_DEPARTMENT_OTHER): Payer: BC Managed Care – PPO | Admitting: Hematology and Oncology

## 2010-10-05 DIAGNOSIS — D469 Myelodysplastic syndrome, unspecified: Secondary | ICD-10-CM

## 2010-10-05 DIAGNOSIS — D649 Anemia, unspecified: Secondary | ICD-10-CM

## 2010-10-05 LAB — CBC WITH DIFFERENTIAL/PLATELET
BASO%: 0.7 % (ref 0.0–2.0)
EOS%: 0.5 % (ref 0.0–7.0)
HCT: 31.1 % — ABNORMAL LOW (ref 38.4–49.9)
LYMPH%: 25.5 % (ref 14.0–49.0)
MCH: 40.4 pg — ABNORMAL HIGH (ref 27.2–33.4)
MCHC: 34.7 g/dL (ref 32.0–36.0)
MCV: 116.5 fL — ABNORMAL HIGH (ref 79.3–98.0)
MONO%: 16.2 % — ABNORMAL HIGH (ref 0.0–14.0)
NEUT%: 57.1 % (ref 39.0–75.0)
Platelets: 242 10*3/uL (ref 140–400)

## 2010-10-26 ENCOUNTER — Other Ambulatory Visit: Payer: Self-pay | Admitting: Hematology and Oncology

## 2010-10-26 ENCOUNTER — Encounter (HOSPITAL_BASED_OUTPATIENT_CLINIC_OR_DEPARTMENT_OTHER): Payer: BC Managed Care – PPO | Admitting: Hematology and Oncology

## 2010-10-26 DIAGNOSIS — E538 Deficiency of other specified B group vitamins: Secondary | ICD-10-CM

## 2010-10-26 DIAGNOSIS — D469 Myelodysplastic syndrome, unspecified: Secondary | ICD-10-CM

## 2010-10-26 DIAGNOSIS — D649 Anemia, unspecified: Secondary | ICD-10-CM

## 2010-10-26 LAB — CBC WITH DIFFERENTIAL/PLATELET
BASO%: 0.5 % (ref 0.0–2.0)
EOS%: 1.9 % (ref 0.0–7.0)
MCH: 41.2 pg — ABNORMAL HIGH (ref 27.2–33.4)
MCHC: 34.5 g/dL (ref 32.0–36.0)
MONO%: 13.3 % (ref 0.0–14.0)
NEUT%: 47.5 % (ref 39.0–75.0)
RDW: 18.3 % — ABNORMAL HIGH (ref 11.0–14.6)
lymph#: 1.6 10*3/uL (ref 0.9–3.3)

## 2010-11-16 ENCOUNTER — Other Ambulatory Visit: Payer: Self-pay | Admitting: Hematology and Oncology

## 2010-11-16 ENCOUNTER — Encounter (HOSPITAL_BASED_OUTPATIENT_CLINIC_OR_DEPARTMENT_OTHER): Payer: BC Managed Care – PPO | Admitting: Hematology and Oncology

## 2010-11-16 DIAGNOSIS — D638 Anemia in other chronic diseases classified elsewhere: Secondary | ICD-10-CM

## 2010-11-16 DIAGNOSIS — D469 Myelodysplastic syndrome, unspecified: Secondary | ICD-10-CM

## 2010-11-16 DIAGNOSIS — D649 Anemia, unspecified: Secondary | ICD-10-CM

## 2010-11-16 LAB — CBC WITH DIFFERENTIAL/PLATELET
Basophils Absolute: 0 10*3/uL (ref 0.0–0.1)
EOS%: 2.4 % (ref 0.0–7.0)
HGB: 10.5 g/dL — ABNORMAL LOW (ref 13.0–17.1)
MCH: 41 pg — ABNORMAL HIGH (ref 27.2–33.4)
NEUT#: 1.6 10*3/uL (ref 1.5–6.5)
RBC: 2.56 10*6/uL — ABNORMAL LOW (ref 4.20–5.82)
RDW: 17.7 % — ABNORMAL HIGH (ref 11.0–14.6)
lymph#: 1.3 10*3/uL (ref 0.9–3.3)

## 2010-12-07 ENCOUNTER — Encounter (HOSPITAL_BASED_OUTPATIENT_CLINIC_OR_DEPARTMENT_OTHER): Payer: BC Managed Care – PPO | Admitting: Hematology and Oncology

## 2010-12-07 ENCOUNTER — Other Ambulatory Visit: Payer: Self-pay | Admitting: Hematology and Oncology

## 2010-12-07 DIAGNOSIS — D649 Anemia, unspecified: Secondary | ICD-10-CM

## 2010-12-07 DIAGNOSIS — D469 Myelodysplastic syndrome, unspecified: Secondary | ICD-10-CM

## 2010-12-07 LAB — CBC WITH DIFFERENTIAL/PLATELET
Basophils Absolute: 0 10*3/uL (ref 0.0–0.1)
Eosinophils Absolute: 0 10*3/uL (ref 0.0–0.5)
HGB: 10.9 g/dL — ABNORMAL LOW (ref 13.0–17.1)
MCV: 111.3 fL — ABNORMAL HIGH (ref 79.3–98.0)
MONO#: 0.5 10*3/uL (ref 0.1–0.9)
MONO%: 11.8 % (ref 0.0–14.0)
NEUT#: 2.3 10*3/uL (ref 1.5–6.5)
RBC: 2.83 10*6/uL — ABNORMAL LOW (ref 4.20–5.82)
RDW: 15.1 % — ABNORMAL HIGH (ref 11.0–14.6)
WBC: 4.3 10*3/uL (ref 4.0–10.3)
lymph#: 1.5 10*3/uL (ref 0.9–3.3)

## 2010-12-28 ENCOUNTER — Encounter (HOSPITAL_BASED_OUTPATIENT_CLINIC_OR_DEPARTMENT_OTHER): Payer: BC Managed Care – PPO | Admitting: Hematology and Oncology

## 2010-12-28 ENCOUNTER — Other Ambulatory Visit: Payer: Self-pay | Admitting: Hematology and Oncology

## 2010-12-28 DIAGNOSIS — D649 Anemia, unspecified: Secondary | ICD-10-CM

## 2010-12-28 LAB — CBC WITH DIFFERENTIAL/PLATELET
Basophils Absolute: 0 10*3/uL (ref 0.0–0.1)
Eosinophils Absolute: 0 10*3/uL (ref 0.0–0.5)
HCT: 32.3 % — ABNORMAL LOW (ref 38.4–49.9)
HGB: 11.2 g/dL — ABNORMAL LOW (ref 13.0–17.1)
LYMPH%: 35.7 % (ref 14.0–49.0)
MCV: 117.8 fL — ABNORMAL HIGH (ref 79.3–98.0)
MONO#: 0.4 10*3/uL (ref 0.1–0.9)
MONO%: 12.8 % (ref 0.0–14.0)
NEUT#: 1.7 10*3/uL (ref 1.5–6.5)
NEUT%: 49.6 % (ref 39.0–75.0)
Platelets: 245 10*3/uL (ref 140–400)
WBC: 3.4 10*3/uL — ABNORMAL LOW (ref 4.0–10.3)

## 2011-01-18 ENCOUNTER — Encounter (HOSPITAL_BASED_OUTPATIENT_CLINIC_OR_DEPARTMENT_OTHER): Payer: BC Managed Care – PPO | Admitting: Hematology and Oncology

## 2011-01-18 ENCOUNTER — Other Ambulatory Visit: Payer: Self-pay | Admitting: Hematology and Oncology

## 2011-01-18 DIAGNOSIS — E538 Deficiency of other specified B group vitamins: Secondary | ICD-10-CM

## 2011-01-18 DIAGNOSIS — D638 Anemia in other chronic diseases classified elsewhere: Secondary | ICD-10-CM

## 2011-01-18 DIAGNOSIS — D469 Myelodysplastic syndrome, unspecified: Secondary | ICD-10-CM

## 2011-01-18 DIAGNOSIS — D649 Anemia, unspecified: Secondary | ICD-10-CM

## 2011-01-18 LAB — CBC WITH DIFFERENTIAL/PLATELET
Eosinophils Absolute: 0 10*3/uL (ref 0.0–0.5)
MONO#: 0.4 10*3/uL (ref 0.1–0.9)
MONO%: 9.3 % (ref 0.0–14.0)
NEUT#: 2.7 10*3/uL (ref 1.5–6.5)
RBC: 2.65 10*6/uL — ABNORMAL LOW (ref 4.20–5.82)
RDW: 16.4 % — ABNORMAL HIGH (ref 11.0–14.6)
WBC: 4.3 10*3/uL (ref 4.0–10.3)
lymph#: 1.1 10*3/uL (ref 0.9–3.3)

## 2011-01-18 LAB — VITAMIN B12: Vitamin B-12: 537 pg/mL (ref 211–911)

## 2011-01-18 LAB — BASIC METABOLIC PANEL
Chloride: 106 mEq/L (ref 96–112)
Glucose, Bld: 90 mg/dL (ref 70–99)
Potassium: 4.4 mEq/L (ref 3.5–5.3)
Sodium: 136 mEq/L (ref 135–145)

## 2011-02-09 ENCOUNTER — Encounter (HOSPITAL_BASED_OUTPATIENT_CLINIC_OR_DEPARTMENT_OTHER): Payer: BC Managed Care – PPO | Admitting: Hematology and Oncology

## 2011-02-09 ENCOUNTER — Other Ambulatory Visit: Payer: Self-pay | Admitting: Hematology and Oncology

## 2011-02-09 DIAGNOSIS — D469 Myelodysplastic syndrome, unspecified: Secondary | ICD-10-CM

## 2011-02-09 DIAGNOSIS — D649 Anemia, unspecified: Secondary | ICD-10-CM

## 2011-02-09 LAB — CBC WITH DIFFERENTIAL/PLATELET
Eosinophils Absolute: 0 10*3/uL (ref 0.0–0.5)
MONO#: 0.6 10*3/uL (ref 0.1–0.9)
NEUT#: 2.5 10*3/uL (ref 1.5–6.5)
Platelets: 217 10*3/uL (ref 140–400)
RBC: 2.49 10*6/uL — ABNORMAL LOW (ref 4.20–5.82)
RDW: 17.4 % — ABNORMAL HIGH (ref 11.0–14.6)
WBC: 4.5 10*3/uL (ref 4.0–10.3)

## 2011-02-17 LAB — CBC
HCT: 25.5 — ABNORMAL LOW
Hemoglobin: 9 — ABNORMAL LOW
RBC: 2.25 — ABNORMAL LOW
WBC: 4.3

## 2011-02-17 LAB — DIFFERENTIAL
Basophils Relative: 0
Lymphocytes Relative: 40
Monocytes Relative: 14 — ABNORMAL HIGH
Neutro Abs: 2

## 2011-02-17 LAB — BONE MARROW EXAM: Bone Marrow Exam: 274

## 2011-03-02 ENCOUNTER — Other Ambulatory Visit: Payer: Self-pay | Admitting: Hematology and Oncology

## 2011-03-02 ENCOUNTER — Encounter (HOSPITAL_BASED_OUTPATIENT_CLINIC_OR_DEPARTMENT_OTHER): Payer: BC Managed Care – PPO | Admitting: Hematology and Oncology

## 2011-03-02 DIAGNOSIS — D469 Myelodysplastic syndrome, unspecified: Secondary | ICD-10-CM

## 2011-03-02 DIAGNOSIS — D649 Anemia, unspecified: Secondary | ICD-10-CM

## 2011-03-02 LAB — CBC WITH DIFFERENTIAL/PLATELET
BASO%: 0.8 % (ref 0.0–2.0)
EOS%: 1.4 % (ref 0.0–7.0)
HCT: 29.9 % — ABNORMAL LOW (ref 38.4–49.9)
MCH: 40.8 pg — ABNORMAL HIGH (ref 27.2–33.4)
MCHC: 34.8 g/dL (ref 32.0–36.0)
NEUT%: 55.3 % (ref 39.0–75.0)
RBC: 2.55 10*6/uL — ABNORMAL LOW (ref 4.20–5.82)
lymph#: 1.2 10*3/uL (ref 0.9–3.3)

## 2011-03-13 ENCOUNTER — Other Ambulatory Visit: Payer: Self-pay | Admitting: Hematology and Oncology

## 2011-03-13 DIAGNOSIS — D649 Anemia, unspecified: Secondary | ICD-10-CM | POA: Insufficient documentation

## 2011-03-13 DIAGNOSIS — D46Z Other myelodysplastic syndromes: Secondary | ICD-10-CM | POA: Insufficient documentation

## 2011-03-13 DIAGNOSIS — D4620 Refractory anemia with excess of blasts, unspecified: Secondary | ICD-10-CM | POA: Insufficient documentation

## 2011-03-21 ENCOUNTER — Other Ambulatory Visit: Payer: Self-pay | Admitting: Hematology and Oncology

## 2011-03-23 ENCOUNTER — Ambulatory Visit (HOSPITAL_BASED_OUTPATIENT_CLINIC_OR_DEPARTMENT_OTHER): Payer: BC Managed Care – PPO

## 2011-03-23 ENCOUNTER — Other Ambulatory Visit: Payer: Self-pay | Admitting: Hematology and Oncology

## 2011-03-23 ENCOUNTER — Other Ambulatory Visit (HOSPITAL_BASED_OUTPATIENT_CLINIC_OR_DEPARTMENT_OTHER): Payer: BC Managed Care – PPO | Admitting: Lab

## 2011-03-23 DIAGNOSIS — D649 Anemia, unspecified: Secondary | ICD-10-CM

## 2011-03-23 DIAGNOSIS — D4620 Refractory anemia with excess of blasts, unspecified: Secondary | ICD-10-CM

## 2011-03-23 DIAGNOSIS — D469 Myelodysplastic syndrome, unspecified: Secondary | ICD-10-CM

## 2011-03-23 LAB — CBC WITH DIFFERENTIAL/PLATELET
BASO%: 0.8 % (ref 0.0–2.0)
Basophils Absolute: 0 10*3/uL (ref 0.0–0.1)
EOS%: 1.6 % (ref 0.0–7.0)
HGB: 10.7 g/dL — ABNORMAL LOW (ref 13.0–17.1)
MCH: 40.9 pg — ABNORMAL HIGH (ref 27.2–33.4)
MCHC: 34.6 g/dL (ref 32.0–36.0)
RDW: 18.4 % — ABNORMAL HIGH (ref 11.0–14.6)
WBC: 3.6 10*3/uL — ABNORMAL LOW (ref 4.0–10.3)
lymph#: 1.2 10*3/uL (ref 0.9–3.3)

## 2011-03-23 MED ORDER — DARBEPOETIN ALFA-POLYSORBATE 500 MCG/ML IJ SOLN
500.0000 ug | Freq: Once | INTRAMUSCULAR | Status: AC
  Administered 2011-03-23: 500 ug via SUBCUTANEOUS
  Filled 2011-03-23: qty 1

## 2011-04-13 ENCOUNTER — Other Ambulatory Visit: Payer: Self-pay | Admitting: Hematology and Oncology

## 2011-04-13 ENCOUNTER — Other Ambulatory Visit (HOSPITAL_BASED_OUTPATIENT_CLINIC_OR_DEPARTMENT_OTHER): Payer: BC Managed Care – PPO | Admitting: Lab

## 2011-04-13 ENCOUNTER — Ambulatory Visit (HOSPITAL_BASED_OUTPATIENT_CLINIC_OR_DEPARTMENT_OTHER): Payer: BC Managed Care – PPO

## 2011-04-13 DIAGNOSIS — D469 Myelodysplastic syndrome, unspecified: Secondary | ICD-10-CM

## 2011-04-13 DIAGNOSIS — D649 Anemia, unspecified: Secondary | ICD-10-CM

## 2011-04-13 DIAGNOSIS — D4620 Refractory anemia with excess of blasts, unspecified: Secondary | ICD-10-CM

## 2011-04-13 LAB — BASIC METABOLIC PANEL
BUN: 19 mg/dL (ref 6–23)
Calcium: 8.9 mg/dL (ref 8.4–10.5)
Creatinine, Ser: 1 mg/dL (ref 0.50–1.35)
Potassium: 4.5 mEq/L (ref 3.5–5.3)

## 2011-04-13 LAB — CBC WITH DIFFERENTIAL/PLATELET
Basophils Absolute: 0 10*3/uL (ref 0.0–0.1)
Eosinophils Absolute: 0 10*3/uL (ref 0.0–0.5)
HGB: 10.5 g/dL — ABNORMAL LOW (ref 13.0–17.1)
NEUT#: 4.3 10*3/uL (ref 1.5–6.5)
RDW: 17 % — ABNORMAL HIGH (ref 11.0–14.6)
lymph#: 1 10*3/uL (ref 0.9–3.3)

## 2011-04-13 LAB — VITAMIN B12: Vitamin B-12: 851 pg/mL (ref 211–911)

## 2011-04-13 MED ORDER — DARBEPOETIN ALFA-POLYSORBATE 500 MCG/ML IJ SOLN
500.0000 ug | Freq: Once | INTRAMUSCULAR | Status: AC
  Administered 2011-04-13: 500 ug via SUBCUTANEOUS
  Filled 2011-04-13: qty 1

## 2011-04-20 ENCOUNTER — Telehealth: Payer: Self-pay | Admitting: Hematology and Oncology

## 2011-04-20 NOTE — Telephone Encounter (Signed)
Lvm advising appt 05/27/11 at 2pm r/s'd from 12/5 appt cx'd due to Epic.

## 2011-05-04 ENCOUNTER — Ambulatory Visit: Payer: BC Managed Care – PPO

## 2011-05-04 ENCOUNTER — Other Ambulatory Visit (HOSPITAL_BASED_OUTPATIENT_CLINIC_OR_DEPARTMENT_OTHER): Payer: BC Managed Care – PPO | Admitting: Lab

## 2011-05-04 DIAGNOSIS — D4620 Refractory anemia with excess of blasts, unspecified: Secondary | ICD-10-CM

## 2011-05-04 DIAGNOSIS — D649 Anemia, unspecified: Secondary | ICD-10-CM

## 2011-05-04 DIAGNOSIS — D46Z Other myelodysplastic syndromes: Secondary | ICD-10-CM

## 2011-05-04 LAB — CBC WITH DIFFERENTIAL/PLATELET
BASO%: 0.4 % (ref 0.0–2.0)
EOS%: 2 % (ref 0.0–7.0)
HCT: 32.1 % — ABNORMAL LOW (ref 38.4–49.9)
MCHC: 34.9 g/dL (ref 32.0–36.0)
MONO#: 0.5 10*3/uL (ref 0.1–0.9)
RBC: 2.83 10*6/uL — ABNORMAL LOW (ref 4.20–5.82)
RDW: 16.2 % — ABNORMAL HIGH (ref 11.0–14.6)
WBC: 4.6 10*3/uL (ref 4.0–10.3)
lymph#: 1.8 10*3/uL (ref 0.9–3.3)
nRBC: 0 % (ref 0–0)

## 2011-05-04 MED ORDER — DARBEPOETIN ALFA-POLYSORBATE 500 MCG/ML IJ SOLN: 500.0000 ug | Freq: Once | INTRAMUSCULAR | Status: DC

## 2011-05-23 ENCOUNTER — Encounter: Payer: Self-pay | Admitting: *Deleted

## 2011-05-26 ENCOUNTER — Telehealth: Payer: Self-pay | Admitting: *Deleted

## 2011-05-26 NOTE — Telephone Encounter (Signed)
Spoke with pt and gave pt new time for appts on 05/27/11  -   11 am  Lab and   1130 am  F/u with Marlowe Kays, PA.   Pt voiced understanding.

## 2011-05-27 ENCOUNTER — Other Ambulatory Visit: Payer: BC Managed Care – PPO

## 2011-05-27 ENCOUNTER — Ambulatory Visit: Payer: BC Managed Care – PPO | Admitting: Hematology and Oncology

## 2011-05-27 ENCOUNTER — Ambulatory Visit (HOSPITAL_BASED_OUTPATIENT_CLINIC_OR_DEPARTMENT_OTHER): Payer: BC Managed Care – PPO | Admitting: Physician Assistant

## 2011-05-27 ENCOUNTER — Other Ambulatory Visit: Payer: BC Managed Care – PPO | Admitting: Lab

## 2011-05-27 VITALS — BP 118/75 | HR 95 | Temp 98.6°F | Ht 71.0 in | Wt 128.3 lb

## 2011-05-27 DIAGNOSIS — D4620 Refractory anemia with excess of blasts, unspecified: Secondary | ICD-10-CM

## 2011-05-27 DIAGNOSIS — F1010 Alcohol abuse, uncomplicated: Secondary | ICD-10-CM

## 2011-05-27 DIAGNOSIS — D649 Anemia, unspecified: Secondary | ICD-10-CM

## 2011-05-27 DIAGNOSIS — D469 Myelodysplastic syndrome, unspecified: Secondary | ICD-10-CM

## 2011-05-27 DIAGNOSIS — E538 Deficiency of other specified B group vitamins: Secondary | ICD-10-CM

## 2011-05-27 LAB — CBC WITH DIFFERENTIAL/PLATELET
Basophils Absolute: 0 10*3/uL (ref 0.0–0.1)
EOS%: 1.8 % (ref 0.0–7.0)
HCT: 30.6 % — ABNORMAL LOW (ref 38.4–49.9)
HGB: 10.4 g/dL — ABNORMAL LOW (ref 13.0–17.1)
LYMPH%: 34.1 % (ref 14.0–49.0)
MCH: 39.7 pg — ABNORMAL HIGH (ref 27.2–33.4)
MCV: 116.3 fL — ABNORMAL HIGH (ref 79.3–98.0)
MONO%: 11.3 % (ref 0.0–14.0)
NEUT%: 52.8 % (ref 39.0–75.0)
Platelets: 250 10*3/uL (ref 140–400)

## 2011-05-27 MED ORDER — DARBEPOETIN ALFA-POLYSORBATE 500 MCG/ML IJ SOLN
500.0000 ug | Freq: Once | INTRAMUSCULAR | Status: AC
  Administered 2011-05-27: 500 ug via SUBCUTANEOUS
  Filled 2011-05-27: qty 1

## 2011-05-27 NOTE — Progress Notes (Signed)
CC: Bryan Lemma. Manus Gunning, M.D.  Venita Lick. Russella Dar, MD, [facility]    IDENTIFYING STATEMENT:  The patient is a 61 year old man with Anemia and mild  MDS as well as B-12 deficiency who presents for followup.  INTERIM HISTORY:  Mr. Ruhe is doing well. He denies any fever, chills, night sweats or worsening of his shortness of breath.His emphysema is well controlled.  No pain. No nausea, vomiting, diarrhea or constipation. He remains fairly active. No recent diseases or hospitalizations. He has been encouraged to discontinue smoking, and at this time he is down to 1/2 pack a day, trying to cut down even more. He has good energy levels.  He has no evidence of bleeding. His last Aranesp was given on December 26,2012.  MEDICATIONS:  B12 1000 mcg daily by mouth, Aranesp 500 mcg every 3 weeks, folic acid, Dilantin, Voltaren.  ALLERGIES:  None.  PHYSICAL EXAMINATION:  General:  Well-appearing, thin man in no distress.    HEENT:  Head is atraumatic, normocephalic.  Sclerae anicteric.  Mouth moist, no thrush.  Abdomen:  Soft, nontender.  Extremities:  No edema.    IVital signs  118/75   P 95   R 20  T 98.6 weight 128.3  LABS:   CBC   Lab 05/27/11 1122  WBC 3.8*  HGB 10.4*  HCT 30.6*  PLT 250  MCV 116.3*  MCH 39.7*  MCHC 34.1  RDW 17.0*  LYMPHSABS 1.3  MONOABS 0.4  EOSABS 0.1  BASOSABS 0.0  BANDABS --    CMP   No results found for this basename: NA:5,K:5,CL:5,CO2:5,GLUCOSE:5,BUN:5,CREATININE:5,GFRCGP,:5,CALCIUM:5,MG:5,AST:5,ALT:5,ALKPHOS:5,BILITOT:5 in the last 168 hours      Component Value Date/Time   BILITOT 0.6 10/13/2009 1514        ASSESSMENT and PLAN:  Mr. Amrhein is a 61 year old man with mild MDS and B12 deficiency.  His hemoglobin remains stable.  He is to continue Aranesp at his current dose of 500 mcg every 3 weeks. He will receive his dose today. His white cell counts continue to be monitored.  He has abstained from alcohol.   He follows up in 4 months'  time.     Marcos Eke, MD 05/27/2011  I have seen and examined the patient and agree with above.   ODOGWU,LAURETTA I.

## 2011-06-17 ENCOUNTER — Ambulatory Visit (HOSPITAL_BASED_OUTPATIENT_CLINIC_OR_DEPARTMENT_OTHER): Payer: BC Managed Care – PPO

## 2011-06-17 ENCOUNTER — Other Ambulatory Visit (HOSPITAL_BASED_OUTPATIENT_CLINIC_OR_DEPARTMENT_OTHER): Payer: BC Managed Care – PPO | Admitting: Lab

## 2011-06-17 DIAGNOSIS — D4620 Refractory anemia with excess of blasts, unspecified: Secondary | ICD-10-CM

## 2011-06-17 DIAGNOSIS — D649 Anemia, unspecified: Secondary | ICD-10-CM

## 2011-06-17 LAB — CBC WITH DIFFERENTIAL/PLATELET
Basophils Absolute: 0 10*3/uL (ref 0.0–0.1)
EOS%: 2.2 % (ref 0.0–7.0)
HGB: 11.1 g/dL — ABNORMAL LOW (ref 13.0–17.1)
MCH: 38.5 pg — ABNORMAL HIGH (ref 27.2–33.4)
MCV: 112.5 fL — ABNORMAL HIGH (ref 79.3–98.0)
MONO%: 15.8 % — ABNORMAL HIGH (ref 0.0–14.0)
RDW: 17 % — ABNORMAL HIGH (ref 11.0–14.6)

## 2011-06-17 NOTE — Progress Notes (Signed)
Pt entered  Clinic today for Aransep injection. Labs where noted to be Hemoglobin 11.1 and hematocrit 32.4. Injection held per parameters order. Pt instructed to  Keep scheduled  Appointments and call if issues occur.

## 2011-07-07 ENCOUNTER — Other Ambulatory Visit: Payer: Self-pay | Admitting: Nurse Practitioner

## 2011-07-07 DIAGNOSIS — D649 Anemia, unspecified: Secondary | ICD-10-CM

## 2011-07-07 DIAGNOSIS — D4620 Refractory anemia with excess of blasts, unspecified: Secondary | ICD-10-CM

## 2011-07-08 ENCOUNTER — Ambulatory Visit (HOSPITAL_BASED_OUTPATIENT_CLINIC_OR_DEPARTMENT_OTHER): Payer: BC Managed Care – PPO

## 2011-07-08 ENCOUNTER — Other Ambulatory Visit: Payer: BC Managed Care – PPO | Admitting: Lab

## 2011-07-08 DIAGNOSIS — D4620 Refractory anemia with excess of blasts, unspecified: Secondary | ICD-10-CM

## 2011-07-08 DIAGNOSIS — D649 Anemia, unspecified: Secondary | ICD-10-CM

## 2011-07-08 LAB — CBC WITH DIFFERENTIAL/PLATELET
Eosinophils Absolute: 0 10*3/uL (ref 0.0–0.5)
HCT: 30.2 % — ABNORMAL LOW (ref 38.4–49.9)
HGB: 10.3 g/dL — ABNORMAL LOW (ref 13.0–17.1)
LYMPH%: 33.4 % (ref 14.0–49.0)
MCV: 115.9 fL — ABNORMAL HIGH (ref 79.3–98.0)
MONO%: 11.2 % (ref 0.0–14.0)
RBC: 2.6 10*6/uL — ABNORMAL LOW (ref 4.20–5.82)
WBC: 3.7 10*3/uL — ABNORMAL LOW (ref 4.0–10.3)

## 2011-07-08 MED ORDER — DARBEPOETIN ALFA-POLYSORBATE 500 MCG/ML IJ SOLN
500.0000 ug | Freq: Once | INTRAMUSCULAR | Status: AC
  Administered 2011-07-08: 500 ug via SUBCUTANEOUS
  Filled 2011-07-08: qty 1

## 2011-07-28 ENCOUNTER — Other Ambulatory Visit: Payer: Self-pay | Admitting: *Deleted

## 2011-07-28 DIAGNOSIS — D649 Anemia, unspecified: Secondary | ICD-10-CM

## 2011-07-29 ENCOUNTER — Other Ambulatory Visit: Payer: BC Managed Care – PPO | Admitting: Lab

## 2011-07-29 ENCOUNTER — Ambulatory Visit: Payer: BC Managed Care – PPO

## 2011-08-03 ENCOUNTER — Telehealth: Payer: Self-pay | Admitting: Nurse Practitioner

## 2011-08-03 NOTE — Telephone Encounter (Signed)
Instructed patient per Dr. Dalene Carrow- may wait until next scheduled appointment.  Does not need to make up missed appointment.

## 2011-08-03 NOTE — Telephone Encounter (Signed)
Patient called.  He missed his injection appointment on 3/22.  His next scheduled lab and injection is for 4/12.  Pt inquiring if he should make up missed appointment or wait until next appointment?

## 2011-08-10 ENCOUNTER — Other Ambulatory Visit: Payer: Self-pay | Admitting: *Deleted

## 2011-08-19 ENCOUNTER — Other Ambulatory Visit (HOSPITAL_BASED_OUTPATIENT_CLINIC_OR_DEPARTMENT_OTHER): Payer: BC Managed Care – PPO | Admitting: Lab

## 2011-08-19 ENCOUNTER — Ambulatory Visit (HOSPITAL_BASED_OUTPATIENT_CLINIC_OR_DEPARTMENT_OTHER): Payer: BC Managed Care – PPO

## 2011-08-19 VITALS — BP 117/67 | HR 97 | Temp 97.9°F

## 2011-08-19 DIAGNOSIS — D4620 Refractory anemia with excess of blasts, unspecified: Secondary | ICD-10-CM

## 2011-08-19 DIAGNOSIS — D649 Anemia, unspecified: Secondary | ICD-10-CM

## 2011-08-19 LAB — CBC WITH DIFFERENTIAL/PLATELET
BASO%: 0.8 % (ref 0.0–2.0)
EOS%: 1.1 % (ref 0.0–7.0)
LYMPH%: 27.6 % (ref 14.0–49.0)
MCH: 41.1 pg — ABNORMAL HIGH (ref 27.2–33.4)
MCHC: 34.5 g/dL (ref 32.0–36.0)
MCV: 119 fL — ABNORMAL HIGH (ref 79.3–98.0)
MONO#: 0.6 10*3/uL (ref 0.1–0.9)
MONO%: 12.6 % (ref 0.0–14.0)
Platelets: 263 10*3/uL (ref 140–400)
RBC: 2.22 10*6/uL — ABNORMAL LOW (ref 4.20–5.82)
WBC: 5.1 10*3/uL (ref 4.0–10.3)
nRBC: 0 % (ref 0–0)

## 2011-08-19 MED ORDER — DARBEPOETIN ALFA-POLYSORBATE 500 MCG/ML IJ SOLN
500.0000 ug | Freq: Once | INTRAMUSCULAR | Status: AC
  Administered 2011-08-19: 500 ug via SUBCUTANEOUS
  Filled 2011-08-19: qty 1

## 2011-09-09 ENCOUNTER — Ambulatory Visit: Payer: BC Managed Care – PPO

## 2011-09-09 ENCOUNTER — Other Ambulatory Visit (HOSPITAL_BASED_OUTPATIENT_CLINIC_OR_DEPARTMENT_OTHER): Payer: BC Managed Care – PPO | Admitting: Lab

## 2011-09-09 VITALS — BP 128/77 | HR 93 | Temp 98.2°F

## 2011-09-09 DIAGNOSIS — D649 Anemia, unspecified: Secondary | ICD-10-CM

## 2011-09-09 DIAGNOSIS — D4620 Refractory anemia with excess of blasts, unspecified: Secondary | ICD-10-CM

## 2011-09-09 LAB — CBC WITH DIFFERENTIAL/PLATELET
BASO%: 1 % (ref 0.0–2.0)
EOS%: 0.8 % (ref 0.0–7.0)
MCH: 41.7 pg — ABNORMAL HIGH (ref 27.2–33.4)
MCHC: 34.7 g/dL (ref 32.0–36.0)
RBC: 2.43 10*6/uL — ABNORMAL LOW (ref 4.20–5.82)
RDW: 17 % — ABNORMAL HIGH (ref 11.0–14.6)
lymph#: 1 10*3/uL (ref 0.9–3.3)

## 2011-09-09 MED ORDER — DARBEPOETIN ALFA-POLYSORBATE 500 MCG/ML IJ SOLN
500.0000 ug | Freq: Once | INTRAMUSCULAR | Status: DC
  Filled 2011-09-09: qty 1

## 2011-09-09 NOTE — Progress Notes (Signed)
hgb 10.0 no aranesp indicated, pt given copy of labs  Aware of future appts.  dmb

## 2011-09-09 NOTE — Progress Notes (Signed)
Addended by: Amaryllis Dyke on: 09/09/2011 02:54 PM   Modules accepted: Orders

## 2011-09-12 NOTE — Progress Notes (Signed)
Spoke with dr Arline Asp (dr Denton Ar out of office) gasve copu of mr Fedorko's labs and let him know that his aranesp was not given, but that we were still trying to get hold of him.  Dr Arline Asp stated that the labs were not that bad and if we were unable to get him "not to loose sleep over it".  Monday 09-12-11 at 0935    dmr

## 2011-09-12 NOTE — Progress Notes (Signed)
All current phone numbers listed are incorrect.  Pt not known at these numbers.  Scheduling had wife's alt number tried, with no answer.   dmr

## 2011-09-20 ENCOUNTER — Telehealth: Payer: Self-pay | Admitting: *Deleted

## 2011-09-20 NOTE — Telephone Encounter (Signed)
Spoke with pt today.  Gave pt new date and time for lab and f/u with NP on 09/21/11.   Pt voiced understanding.

## 2011-09-21 ENCOUNTER — Telehealth: Payer: Self-pay | Admitting: Hematology and Oncology

## 2011-09-21 ENCOUNTER — Other Ambulatory Visit (HOSPITAL_BASED_OUTPATIENT_CLINIC_OR_DEPARTMENT_OTHER): Payer: BC Managed Care – PPO

## 2011-09-21 ENCOUNTER — Encounter: Payer: Self-pay | Admitting: Nurse Practitioner

## 2011-09-21 ENCOUNTER — Ambulatory Visit (HOSPITAL_BASED_OUTPATIENT_CLINIC_OR_DEPARTMENT_OTHER): Payer: BC Managed Care – PPO | Admitting: Nurse Practitioner

## 2011-09-21 VITALS — BP 138/81 | HR 95 | Temp 98.6°F | Ht 71.0 in | Wt 124.4 lb

## 2011-09-21 DIAGNOSIS — E538 Deficiency of other specified B group vitamins: Secondary | ICD-10-CM

## 2011-09-21 DIAGNOSIS — D469 Myelodysplastic syndrome, unspecified: Secondary | ICD-10-CM

## 2011-09-21 DIAGNOSIS — D649 Anemia, unspecified: Secondary | ICD-10-CM

## 2011-09-21 LAB — CBC WITH DIFFERENTIAL/PLATELET
Basophils Absolute: 0 10*3/uL (ref 0.0–0.1)
Eosinophils Absolute: 0 10*3/uL (ref 0.0–0.5)
LYMPH%: 31.3 % (ref 14.0–49.0)
MCH: 42.1 pg — ABNORMAL HIGH (ref 27.2–33.4)
MCV: 119.1 fL — ABNORMAL HIGH (ref 79.3–98.0)
MONO%: 13.5 % (ref 0.0–14.0)
NEUT#: 2.5 10*3/uL (ref 1.5–6.5)
Platelets: 234 10*3/uL (ref 140–400)
RBC: 2.27 10*6/uL — ABNORMAL LOW (ref 4.20–5.82)
nRBC: 0 % (ref 0–0)

## 2011-09-21 NOTE — Progress Notes (Signed)
OUTPATIENT CLINIC NOTE  09/21/2011   CHIEF COMPLAINT: Patient presents for follow up of anemia and MDS. Pt seen every 4 months - he is doing quite well on Aranesp q 3weeks.    DIAGNOSIS: Anemia and MDS   CURRENT THERAPY: Aranesp  500 mcg every 3 weeks and B12 1000 by mouth.   HPI: Adrian [name] is seen today in routine follow up of his anemia and MDS. He also has a B12 deficiency. He states he is doing ok overall, but continues to smoke 1/4 to 1/2 pack per day though he states he is in process of quitting. Unfortunately his son was killed recently in a motor vehicle accident in [place] at the age of 79. He states he recently lost about 5 lbs due to lack of appetite as a result of grieving - he buried his son on August 08, 2011.  His review of systems was noncontributory. He has no complaints and wants to remain on his current regimen. WBC today 4.6, Hgb 9.6, HCT 27.1, platelets 234 K   REVIEW OF SYSTEMS:  Performance Status:Normal - ECOG 0   Constitutional: Normal - No Fever, chills, night sweats, or weight loss  Cardiovascular:Normal - No chest pain, DOE, or palpitations  Respiratory: Normal - No shortness of breath, cough, hemoptysis, or pleuritic chest pain  Gastrointestinal:Normal - No abdominal pain, nausea, vomiting, diarrhea, rectal pain or bleeding  Genitourinary: Normal - Denies Hematuria or dysuria  Musculoskeletal: Normal - No bone pain  Skin: Normal - No skin rash or lesions noted  Neurologic: Normal - No numbness, weakness, neuropathic pain or change in cognitive function  Current Outpatient Prescriptions on File Prior to Visit  Medication Sig Dispense Refill  . diazepam (VALIUM) 5 MG tablet Take 5 mg by mouth daily.      . Fe Fum-Vit C-Vit B12-FA (HEMATOGEN FORTE PO) Take 1 tablet by mouth daily.      . folic acid (FOLVITE) 1 MG tablet Take 1 mg by mouth daily.      . phenytoin (DILANTIN) 200 MG ER capsule Take 200 mg by mouth 2 (two) times daily.      . vitamin  B-12 (CYANOCOBALAMIN) 1000 MCG tablet Take 1,000 mcg by mouth daily.        PHYSICAL EXAMINATION: Filed Vitals:   09/21/11 1449  BP: 138/81  Pulse: 95  Temp: 98.6 F (37 C)   General Appearance: Abnormal - somewhat thin black male in no acute distress  Skin: Normal - No skin rash, petechiae, ecchymosis noted  Lungs/Thorax: Normal - Clear to auscultation and percussion  Heart: Normal - Regular rate and rhythm, normal S1, S2, no appreciable murmurs, rubs, gallops  Abdomen:Normal - Soft, nontender, bowel sounds present, no appreciable hepatosplenomegaly, no palpable masses  Musculoskeletal: Normal - No pain on palpation over bony prominence, no edema, no evidence of gout, no joint or bony deformity  Neurologic: Normal - Grossly intact   LABORATORY DATA:  Results for orders placed in visit on 09/21/11 (from the past 48 hour(s))  CBC WITH DIFFERENTIAL     Status: Abnormal   Collection Time   09/21/11  2:00 PM      Component Value Range Comment   WBC 4.6  4.0 - 10.3 (10e3/uL)    NEUT# 2.5  1.5 - 6.5 (10e3/uL)    HGB 9.6 (*) 13.0 - 17.1 (g/dL)    HCT 16.1 (*) 09.6 - 49.9 (%)    Platelets 234  140 - 400 (10e3/uL)  MCV 119.1 (*) 79.3 - 98.0 (fL)    MCH 42.1 (*) 27.2 - 33.4 (pg)    MCHC 35.4  32.0 - 36.0 (g/dL)    RBC 4.09 (*) 8.11 - 5.82 (10e6/uL)    RDW 17.3 (*) 11.0 - 14.6 (%)    lymph# 1.4  0.9 - 3.3 (10e3/uL)    MONO# 0.6  0.1 - 0.9 (10e3/uL)    Eosinophils Absolute 0.0  0.0 - 0.5 (10e3/uL)    Basophils Absolute 0.0  0.0 - 0.1 (10e3/uL)    NEUT% 53.8  39.0 - 75.0 (%)    LYMPH% 31.3  14.0 - 49.0 (%)    MONO% 13.5  0.0 - 14.0 (%)    EOS% 0.9  0.0 - 7.0 (%)    BASO% 0.5  0.0 - 2.0 (%)    nRBC 0  0 - 0 (%)      PENDING LABS:   RADIOGRAPHIC STUDIES:  No results found.    ASSESSMENT: Anemia, MDS and Vit B 12 deficiency   PLAN: Continue oral B12, Aranesp 500 mcg q 3 weeks and follow up in 4 months   All questions were answered. The patient knows to call the  clinic with any problems, questions or concerns. We can certainly see the patient much sooner if necessary.  The patient and plan discussed with Arlan Organ, MD and he is in agreement with the aforementioned.  I spent 20 minutes counseling the patient face to face. The total time spent in the appointment was 20 minutes.  Adrian Quaker, NP-C, MSN

## 2011-09-21 NOTE — Telephone Encounter (Signed)
Gave pt appt calendar on September 2013 lab and MD

## 2011-09-22 ENCOUNTER — Ambulatory Visit: Payer: BC Managed Care – PPO | Admitting: Hematology and Oncology

## 2011-09-22 ENCOUNTER — Other Ambulatory Visit: Payer: BC Managed Care – PPO | Admitting: Lab

## 2012-01-20 ENCOUNTER — Other Ambulatory Visit (HOSPITAL_BASED_OUTPATIENT_CLINIC_OR_DEPARTMENT_OTHER): Payer: BC Managed Care – PPO | Admitting: Lab

## 2012-01-20 DIAGNOSIS — D649 Anemia, unspecified: Secondary | ICD-10-CM

## 2012-01-20 LAB — COMPREHENSIVE METABOLIC PANEL (CC13)
CO2: 19 mEq/L — ABNORMAL LOW (ref 22–29)
Calcium: 8.7 mg/dL (ref 8.4–10.4)
Glucose: 102 mg/dl — ABNORMAL HIGH (ref 70–99)
Sodium: 136 mEq/L (ref 136–145)
Total Bilirubin: 0.3 mg/dL (ref 0.20–1.20)
Total Protein: 8.2 g/dL (ref 6.4–8.3)

## 2012-01-20 LAB — CBC WITH DIFFERENTIAL/PLATELET
BASO%: 0.7 % (ref 0.0–2.0)
EOS%: 0.8 % (ref 0.0–7.0)
HCT: 25.9 % — ABNORMAL LOW (ref 38.4–49.9)
LYMPH%: 25.9 % (ref 14.0–49.0)
MCH: 41.9 pg — ABNORMAL HIGH (ref 27.2–33.4)
MCHC: 34.7 g/dL (ref 32.0–36.0)
NEUT%: 60.6 % (ref 39.0–75.0)
Platelets: 248 10*3/uL (ref 140–400)

## 2012-01-25 ENCOUNTER — Telehealth: Payer: Self-pay | Admitting: Hematology and Oncology

## 2012-01-25 ENCOUNTER — Encounter: Payer: Self-pay | Admitting: Family

## 2012-01-25 ENCOUNTER — Ambulatory Visit (HOSPITAL_BASED_OUTPATIENT_CLINIC_OR_DEPARTMENT_OTHER): Payer: BC Managed Care – PPO | Admitting: Family

## 2012-01-25 ENCOUNTER — Ambulatory Visit (HOSPITAL_BASED_OUTPATIENT_CLINIC_OR_DEPARTMENT_OTHER): Payer: BC Managed Care – PPO

## 2012-01-25 VITALS — BP 130/72 | HR 104 | Temp 99.1°F | Resp 20 | Ht 71.0 in | Wt 127.2 lb

## 2012-01-25 DIAGNOSIS — D469 Myelodysplastic syndrome, unspecified: Secondary | ICD-10-CM

## 2012-01-25 DIAGNOSIS — D4620 Refractory anemia with excess of blasts, unspecified: Secondary | ICD-10-CM

## 2012-01-25 DIAGNOSIS — D649 Anemia, unspecified: Secondary | ICD-10-CM

## 2012-01-25 DIAGNOSIS — E538 Deficiency of other specified B group vitamins: Secondary | ICD-10-CM

## 2012-01-25 MED ORDER — DARBEPOETIN ALFA-POLYSORBATE 300 MCG/0.6ML IJ SOLN
300.0000 ug | Freq: Once | INTRAMUSCULAR | Status: AC
  Administered 2012-01-25: 300 ug via SUBCUTANEOUS
  Filled 2012-01-25: qty 0.6

## 2012-01-25 NOTE — Patient Instructions (Signed)
Think about quitting smoking Work at not losing any more weight

## 2012-01-25 NOTE — Progress Notes (Signed)
Patient ID: Adrian [name], male   DOB: [birth year], 61 y.o.   MRN: [id number] CSN: [id number]  Adrian [name]. Adrian [name], M.D.  Adrian [name]. Russella Dar, MD, [name]   Identifying Statement: Adrian [name] is a 61 y.o. African-American male with a history of MDS, anemia and vitamin B-12 deficiency who presents for follow-up.    Interval History: Adrian [name] was last seen in our office on 09/11/2011.  Since his last office visit, the patient experienced SOB and was diagnosed with emphysema.  The patient was started on Symbicort BID by Dr. Manus [name].  The patient states that overall he is feeling well and is without complaint today.  [name] denies any symptomatology including SOB, fatigue, fever, chills, PICA, N/V/D, constipation and any unusual bleeding or bruising.  The patient states that he has run out of [employer] and is wondering if he needs to continue this medication.  Medications: Current Outpatient Prescriptions  Medication Sig Dispense Refill  . budesonide-formoterol (SYMBICORT) 160-4.5 MCG/ACT inhaler Inhale 2 puffs into the lungs 2 (two) times daily.      . diazepam (VALIUM) 5 MG tablet Take 5 mg by mouth daily.      . Fe Fum-Vit C-Vit B12-FA (HEMATOGEN FORTE PO) Take 1 tablet by mouth daily.      . folic acid (FOLVITE) 1 MG tablet Take 1 mg by mouth daily.      . phenytoin (DILANTIN) 200 MG ER capsule Take 200 mg by mouth 2 (two) times daily.      . vitamin B-12 (CYANOCOBALAMIN) 1000 MCG tablet Take 1,000 mcg by mouth daily.       No current facility-administered medications for this visit.   Facility-Administered Medications Ordered in Other Visits  Medication Dose Route Frequency Provider Last Rate Last Dose  . darbepoetin (ARANESP) injection 300 mcg  300 mcg Subcutaneous Once Keitha Butte, NP   300 mcg at 01/25/12 1455    Allergies: No Known Allergies  Past Medical History: Past Medical History  Diagnosis Date  . Seizures   . Megaloblastic anemia   . Emphysema of lung       Family History: Family History  Problem Relation Age of Onset  . Hypertension Mother     Social History: History  Substance Use Topics  . Smoking status: Current Every Day Smoker -- 0.2 packs/day for 15 years    Types: Cigarettes  . Smokeless tobacco: Never Used   Comment: patient states he is slowly quitting on his own - does not want assistance  . Alcohol Use: Yes     once every three months    Review of Systems: 10 point review of systems was completed and is negative except as noted above.   Physical Exam: Blood pressure 130/72, pulse 104, temperature 99.1 F (37.3 C), resp. rate 20, height 5\' 11"  (1.803 m), weight 127 lb 3.2 oz (57.698 kg).  General appearance: Alert, cooperative, thin-frame, no apparent distress Head: Normocephalic, without obvious abnormality, atraumatic Eyes: Conjunctivae clear, scleral icterus, arcus senilis, PERRLA, EOMI Nose: Nares, septum and mucosa are normal, no drainage or sinus tenderness Neck: No adenopathy, supple, symmetrical, trachea midline, thyroid not enlarged, no tenderness Resp: Diminished bibasilar breath sounds (RLL > LLL), clear to auscultation bilaterally Cardio: Regular rate and rhythm, S1, S2 normal, no murmur, click, rub or gallop GI: Soft, non-tender, non-distended, hypoactive bowel sounds, no organomegaly Extremities: Extremities normal, atraumatic, no cyanosis or edema Lymph nodes: Cervical, supraclavicular, and axillary nodes normal Neurologic: Grossly normal   Laboratory Data:  Sodium 136, potassium 4.2, chloride 107, CO2 19, BUN 15.0, creatinine 1.1, calcium 8.7, glucose 102, alkaline phosphatase 190, albumin 3.3, AST 7, ALT less than 6, total protein 8.2, total bilirubin 0.30, iron 47, UIBC 145, TIBC 192, ferritin 171, RBC folate 859, WBCs 5.1, RBCs 2.15, hemoglobin 9.0, hematocrit 25.9, MCV 120.7, platelets 248, ANC 3.1   Impression/Plan: Adrian [name] is a 61 year-old African-American male with MDS, anemia and  vitamin B-12 deficiency.  The patient did receive an Aranesp injection today and will continue having laboratories/Aranesp injection every 3 weeks.  He will continue taking oral B12.  Dr. Dalene Carrow stated that the patient may suspend taking Hematogen for now and we will re-evaluate during his next office visit.  The patient is scheduled to return in 4 months' time with laboratories to be obtained prior to the office visit.     Larina Bras, NP-C 01/25/2012, 5:14 PM

## 2012-01-25 NOTE — Telephone Encounter (Signed)
Gave pt appt for lab and injection Q 3 weeks until January 2013 and then see ML with labs and injections

## 2012-02-15 ENCOUNTER — Ambulatory Visit (HOSPITAL_BASED_OUTPATIENT_CLINIC_OR_DEPARTMENT_OTHER): Payer: BC Managed Care – PPO

## 2012-02-15 ENCOUNTER — Other Ambulatory Visit (HOSPITAL_BASED_OUTPATIENT_CLINIC_OR_DEPARTMENT_OTHER): Payer: BC Managed Care – PPO | Admitting: Lab

## 2012-02-15 VITALS — BP 140/75 | HR 90 | Temp 97.1°F

## 2012-02-15 DIAGNOSIS — D649 Anemia, unspecified: Secondary | ICD-10-CM

## 2012-02-15 DIAGNOSIS — D4620 Refractory anemia with excess of blasts, unspecified: Secondary | ICD-10-CM

## 2012-02-15 LAB — CBC WITH DIFFERENTIAL/PLATELET
BASO%: 0.8 % (ref 0.0–2.0)
Eosinophils Absolute: 0.1 10*3/uL (ref 0.0–0.5)
HCT: 27.7 % — ABNORMAL LOW (ref 38.4–49.9)
HGB: 9.6 g/dL — ABNORMAL LOW (ref 13.0–17.1)
MCHC: 34.5 g/dL (ref 32.0–36.0)
MONO#: 0.5 10*3/uL (ref 0.1–0.9)
NEUT#: 3 10*3/uL (ref 1.5–6.5)
NEUT%: 58.8 % (ref 39.0–75.0)
WBC: 5.1 10*3/uL (ref 4.0–10.3)
lymph#: 1.5 10*3/uL (ref 0.9–3.3)

## 2012-02-15 MED ORDER — DARBEPOETIN ALFA-POLYSORBATE 300 MCG/0.6ML IJ SOLN
300.0000 ug | Freq: Once | INTRAMUSCULAR | Status: AC
  Administered 2012-02-15: 300 ug via SUBCUTANEOUS
  Filled 2012-02-15: qty 0.6

## 2012-03-07 ENCOUNTER — Other Ambulatory Visit (HOSPITAL_BASED_OUTPATIENT_CLINIC_OR_DEPARTMENT_OTHER): Payer: BC Managed Care – PPO

## 2012-03-07 ENCOUNTER — Ambulatory Visit (HOSPITAL_BASED_OUTPATIENT_CLINIC_OR_DEPARTMENT_OTHER): Payer: BC Managed Care – PPO

## 2012-03-07 VITALS — BP 129/80 | HR 86 | Temp 98.4°F

## 2012-03-07 DIAGNOSIS — D649 Anemia, unspecified: Secondary | ICD-10-CM

## 2012-03-07 DIAGNOSIS — D4620 Refractory anemia with excess of blasts, unspecified: Secondary | ICD-10-CM

## 2012-03-07 LAB — CBC WITH DIFFERENTIAL/PLATELET
Basophils Absolute: 0 10*3/uL (ref 0.0–0.1)
EOS%: 1.3 % (ref 0.0–7.0)
HCT: 29 % — ABNORMAL LOW (ref 38.4–49.9)
HGB: 10.3 g/dL — ABNORMAL LOW (ref 13.0–17.1)
MCH: 42.5 pg — ABNORMAL HIGH (ref 27.2–33.4)
MONO#: 0.4 10*3/uL (ref 0.1–0.9)
NEUT%: 51.8 % (ref 39.0–75.0)
Platelets: 243 10*3/uL (ref 140–400)
lymph#: 1.2 10*3/uL (ref 0.9–3.3)

## 2012-03-07 MED ORDER — DARBEPOETIN ALFA-POLYSORBATE 300 MCG/0.6ML IJ SOLN
300.0000 ug | Freq: Once | INTRAMUSCULAR | Status: AC
  Administered 2012-03-07: 300 ug via SUBCUTANEOUS
  Filled 2012-03-07: qty 0.6

## 2012-03-28 ENCOUNTER — Ambulatory Visit (HOSPITAL_BASED_OUTPATIENT_CLINIC_OR_DEPARTMENT_OTHER): Payer: BC Managed Care – PPO

## 2012-03-28 ENCOUNTER — Other Ambulatory Visit (HOSPITAL_BASED_OUTPATIENT_CLINIC_OR_DEPARTMENT_OTHER): Payer: BC Managed Care – PPO

## 2012-03-28 VITALS — BP 124/79 | HR 92 | Temp 98.4°F

## 2012-03-28 DIAGNOSIS — D4620 Refractory anemia with excess of blasts, unspecified: Secondary | ICD-10-CM

## 2012-03-28 DIAGNOSIS — D649 Anemia, unspecified: Secondary | ICD-10-CM

## 2012-03-28 DIAGNOSIS — D46Z Other myelodysplastic syndromes: Secondary | ICD-10-CM

## 2012-03-28 LAB — CBC WITH DIFFERENTIAL/PLATELET
Basophils Absolute: 0 10*3/uL (ref 0.0–0.1)
Eosinophils Absolute: 0.1 10*3/uL (ref 0.0–0.5)
HGB: 10.2 g/dL — ABNORMAL LOW (ref 13.0–17.1)
LYMPH%: 27.5 % (ref 14.0–49.0)
MCV: 117.4 fL — ABNORMAL HIGH (ref 79.3–98.0)
MONO#: 0.6 10*3/uL (ref 0.1–0.9)
MONO%: 12.4 % (ref 0.0–14.0)
NEUT#: 2.7 10*3/uL (ref 1.5–6.5)
Platelets: 249 10*3/uL (ref 140–400)
RDW: 17.3 % — ABNORMAL HIGH (ref 11.0–14.6)
WBC: 4.7 10*3/uL (ref 4.0–10.3)

## 2012-03-28 MED ORDER — DARBEPOETIN ALFA-POLYSORBATE 300 MCG/0.6ML IJ SOLN
300.0000 ug | Freq: Once | INTRAMUSCULAR | Status: AC
  Administered 2012-03-28: 300 ug via SUBCUTANEOUS
  Filled 2012-03-28: qty 0.6

## 2012-04-18 ENCOUNTER — Other Ambulatory Visit (HOSPITAL_BASED_OUTPATIENT_CLINIC_OR_DEPARTMENT_OTHER): Payer: BC Managed Care – PPO | Admitting: Lab

## 2012-04-18 ENCOUNTER — Ambulatory Visit (HOSPITAL_BASED_OUTPATIENT_CLINIC_OR_DEPARTMENT_OTHER): Payer: BC Managed Care – PPO

## 2012-04-18 VITALS — BP 130/80 | HR 96 | Temp 97.9°F

## 2012-04-18 DIAGNOSIS — D649 Anemia, unspecified: Secondary | ICD-10-CM

## 2012-04-18 DIAGNOSIS — D4620 Refractory anemia with excess of blasts, unspecified: Secondary | ICD-10-CM

## 2012-04-18 DIAGNOSIS — D46Z Other myelodysplastic syndromes: Secondary | ICD-10-CM

## 2012-04-18 LAB — CBC WITH DIFFERENTIAL/PLATELET
Eosinophils Absolute: 0 10*3/uL (ref 0.0–0.5)
HCT: 30.6 % — ABNORMAL LOW (ref 38.4–49.9)
LYMPH%: 32.2 % (ref 14.0–49.0)
MCV: 119.7 fL — ABNORMAL HIGH (ref 79.3–98.0)
MONO#: 0.6 10*3/uL (ref 0.1–0.9)
MONO%: 16.1 % — ABNORMAL HIGH (ref 0.0–14.0)
NEUT#: 2 10*3/uL (ref 1.5–6.5)
NEUT%: 50 % (ref 39.0–75.0)
Platelets: 204 10*3/uL (ref 140–400)
RBC: 2.56 10*6/uL — ABNORMAL LOW (ref 4.20–5.82)

## 2012-04-18 MED ORDER — DARBEPOETIN ALFA-POLYSORBATE 300 MCG/0.6ML IJ SOLN
300.0000 ug | Freq: Once | INTRAMUSCULAR | Status: AC
  Administered 2012-04-18: 300 ug via SUBCUTANEOUS
  Filled 2012-04-18: qty 0.6

## 2012-05-01 ENCOUNTER — Telehealth: Payer: Self-pay | Admitting: Oncology

## 2012-05-01 ENCOUNTER — Telehealth: Payer: Self-pay | Admitting: Hematology and Oncology

## 2012-05-01 ENCOUNTER — Other Ambulatory Visit: Payer: Self-pay

## 2012-05-01 NOTE — Telephone Encounter (Signed)
Former pt of LO being re-established w/[facility]. Rearranged appts due to no availability w/[facility] wk of 1/23. Pt will keep lb/inj 1/2 and appt for lb 1/21 and inj 1/22 after f/u was combined and moved to 1/23 (inj's q3w per 01/25/12 pof).  Pt will have lb/inj only on 1/23. F/u moved to 1/29 and pt scheduled w/HH. Pt aware of all of the above and new schedule was mailed today.

## 2012-05-01 NOTE — Telephone Encounter (Signed)
pof from 12/24 from [facility] regarding reassignemnt,this was printed and given to [employer].

## 2012-05-09 ENCOUNTER — Ambulatory Visit: Payer: BC Managed Care – PPO

## 2012-05-09 HISTORY — PX: OTHER SURGICAL HISTORY: SHX169

## 2012-05-09 HISTORY — PX: PEG PLACEMENT: SHX5437

## 2012-05-10 ENCOUNTER — Other Ambulatory Visit: Payer: Self-pay | Admitting: Oncology

## 2012-05-10 ENCOUNTER — Ambulatory Visit (HOSPITAL_BASED_OUTPATIENT_CLINIC_OR_DEPARTMENT_OTHER): Payer: BC Managed Care – PPO

## 2012-05-10 ENCOUNTER — Other Ambulatory Visit (HOSPITAL_BASED_OUTPATIENT_CLINIC_OR_DEPARTMENT_OTHER): Payer: BC Managed Care – PPO | Admitting: Lab

## 2012-05-10 VITALS — BP 120/75 | HR 89 | Temp 98.3°F

## 2012-05-10 DIAGNOSIS — D4620 Refractory anemia with excess of blasts, unspecified: Secondary | ICD-10-CM

## 2012-05-10 DIAGNOSIS — D649 Anemia, unspecified: Secondary | ICD-10-CM

## 2012-05-10 LAB — CBC WITH DIFFERENTIAL/PLATELET
BASO%: 1 % (ref 0.0–2.0)
EOS%: 1.4 % (ref 0.0–7.0)
HCT: 29.9 % — ABNORMAL LOW (ref 38.4–49.9)
LYMPH%: 29.5 % (ref 14.0–49.0)
MCH: 41.6 pg — ABNORMAL HIGH (ref 27.2–33.4)
MCHC: 34.9 g/dL (ref 32.0–36.0)
MCV: 119.2 fL — ABNORMAL HIGH (ref 79.3–98.0)
MONO#: 0.6 10*3/uL (ref 0.1–0.9)
MONO%: 12.6 % (ref 0.0–14.0)
NEUT%: 55.5 % (ref 39.0–75.0)
Platelets: 185 10*3/uL (ref 140–400)
RBC: 2.51 10*6/uL — ABNORMAL LOW (ref 4.20–5.82)
WBC: 4.8 10*3/uL (ref 4.0–10.3)

## 2012-05-10 MED ORDER — DARBEPOETIN ALFA-POLYSORBATE 300 MCG/0.6ML IJ SOLN
300.0000 ug | Freq: Once | INTRAMUSCULAR | Status: AC
  Administered 2012-05-10: 300 ug via SUBCUTANEOUS
  Filled 2012-05-10: qty 0.6

## 2012-05-29 ENCOUNTER — Other Ambulatory Visit: Payer: BC Managed Care – PPO | Admitting: Lab

## 2012-05-30 ENCOUNTER — Ambulatory Visit: Payer: BC Managed Care – PPO

## 2012-05-30 ENCOUNTER — Other Ambulatory Visit: Payer: BC Managed Care – PPO | Admitting: Lab

## 2012-05-30 ENCOUNTER — Ambulatory Visit: Payer: BC Managed Care – PPO | Admitting: Family

## 2012-05-31 ENCOUNTER — Other Ambulatory Visit (HOSPITAL_BASED_OUTPATIENT_CLINIC_OR_DEPARTMENT_OTHER): Payer: BC Managed Care – PPO | Admitting: Lab

## 2012-05-31 ENCOUNTER — Ambulatory Visit (HOSPITAL_BASED_OUTPATIENT_CLINIC_OR_DEPARTMENT_OTHER): Payer: BC Managed Care – PPO

## 2012-05-31 VITALS — BP 131/74 | HR 89 | Temp 97.4°F

## 2012-05-31 DIAGNOSIS — D649 Anemia, unspecified: Secondary | ICD-10-CM

## 2012-05-31 DIAGNOSIS — D4620 Refractory anemia with excess of blasts, unspecified: Secondary | ICD-10-CM

## 2012-05-31 DIAGNOSIS — D46Z Other myelodysplastic syndromes: Secondary | ICD-10-CM

## 2012-05-31 LAB — CBC WITH DIFFERENTIAL/PLATELET
BASO%: 0.5 % (ref 0.0–2.0)
EOS%: 1.4 % (ref 0.0–7.0)
LYMPH%: 36.3 % (ref 14.0–49.0)
MCH: 39.1 pg — ABNORMAL HIGH (ref 27.2–33.4)
MCHC: 34.1 g/dL (ref 32.0–36.0)
MCV: 114.6 fL — ABNORMAL HIGH (ref 79.3–98.0)
MONO#: 0.4 10*3/uL (ref 0.1–0.9)
MONO%: 11.2 % (ref 0.0–14.0)
Platelets: 236 10*3/uL (ref 140–400)
RBC: 2.61 10*6/uL — ABNORMAL LOW (ref 4.20–5.82)
WBC: 3.7 10*3/uL — ABNORMAL LOW (ref 4.0–10.3)
nRBC: 0 % (ref 0–0)

## 2012-05-31 LAB — COMPREHENSIVE METABOLIC PANEL (CC13)
ALT: 7 U/L (ref 0–55)
AST: 10 U/L (ref 5–34)
Alkaline Phosphatase: 149 U/L (ref 40–150)
Creatinine: 0.8 mg/dL (ref 0.7–1.3)
Total Bilirubin: <0.2 mg/dL (ref 0.20–1.20)

## 2012-05-31 MED ORDER — DARBEPOETIN ALFA-POLYSORBATE 300 MCG/0.6ML IJ SOLN
300.0000 ug | Freq: Once | INTRAMUSCULAR | Status: AC
  Administered 2012-05-31: 300 ug via SUBCUTANEOUS
  Filled 2012-05-31: qty 0.6

## 2012-06-01 LAB — VITAMIN B12: Vitamin B-12: 1790 pg/mL — ABNORMAL HIGH (ref 211–911)

## 2012-06-01 LAB — IRON AND TIBC
%SAT: 42 % (ref 20–55)
TIBC: 183 ug/dL — ABNORMAL LOW (ref 215–435)
UIBC: 107 ug/dL — ABNORMAL LOW (ref 125–400)

## 2012-06-05 NOTE — Patient Instructions (Addendum)
1.  Diagnosis:  Anemia of chronic disease vs. Early stage of Myelodysplastic syndrome vs. Vitamin B12 deficiency 2.  Treatment:  I recommend to continue with Aranesp injection and Vit B12 injection.  3.  Follow up:  In about 4 months.  In the future, if despite these injections and blood counts drop, then we may consider repeating bone marrow biopsy.

## 2012-06-06 ENCOUNTER — Telehealth: Payer: Self-pay | Admitting: Oncology

## 2012-06-06 ENCOUNTER — Ambulatory Visit (HOSPITAL_BASED_OUTPATIENT_CLINIC_OR_DEPARTMENT_OTHER): Payer: BC Managed Care – PPO | Admitting: Oncology

## 2012-06-06 VITALS — BP 148/81 | HR 94 | Temp 97.0°F | Resp 18 | Ht 71.0 in | Wt 126.3 lb

## 2012-06-06 DIAGNOSIS — J029 Acute pharyngitis, unspecified: Secondary | ICD-10-CM

## 2012-06-06 DIAGNOSIS — D638 Anemia in other chronic diseases classified elsewhere: Secondary | ICD-10-CM

## 2012-06-06 MED ORDER — MUGARD MT LIQD: 10.0000 mL | Freq: Four times a day (QID) | OROMUCOSAL | Status: DC | PRN

## 2012-06-06 NOTE — Telephone Encounter (Signed)
gv and printed appt schedule for pt for Feb thru Jan 2015....Marland Kitchenschedule pt appt with Dr. Pollyann Kennedy for Feb 7th @ 1:30pm.

## 2012-06-06 NOTE — Progress Notes (Signed)
[facility]  [phone] [phone]   OFFICE PROGRESS NOTE   Cc:  [name], MD  DIAGNOSIS:  Early MDS vs. Anemia of chronic disease and Vit B12 deficiency.   CURRENT THERAPY:  Aranesp injection and oral Vit B12 replacement.   INTERVAL HISTORY: Adrian [name] 62 y.o. male returns for regular follow up.  He used to see Dr. Dalene Carrow who had since left the practice.  I assumed patient's care as of today.  He reported having sore throat since October 2013.  He was given empiric treatment with Nystatin and then salt/baking soda mouth rinses with residual symptoms.  He used to smoke cigarettes.  Pain is worst with eating.  He sometimes feel foods get stuck in the back of his throat.  He denied palpable neck nodes.    Patient denies fever, anorexia, weight loss, fatigue, headache, visual changes, confusion, drenching night sweats,nausea vomiting, jaundice, chest pain, palpitation, shortness of breath, dyspnea on exertion, productive cough, gum bleeding, epistaxis, hematemesis, hemoptysis, abdominal pain, abdominal swelling, early satiety, melena, hematochezia, hematuria, skin rash, spontaneous bleeding, joint swelling, joint pain, heat or cold intolerance, bowel bladder incontinence, back pain, focal motor weakness, paresthesia.     Past Medical History  Diagnosis Date  . Seizures   . Megaloblastic anemia   . Emphysema of lung     Past Surgical History  Procedure Date  . Abdominal hernia repair     Current Outpatient Prescriptions  Medication Sig Dispense Refill  . budesonide-formoterol (SYMBICORT) 160-4.5 MCG/ACT inhaler Inhale 2 puffs into the lungs 2 (two) times daily.      . diazepam (VALIUM) 5 MG tablet Take 5 mg by mouth daily.      . Fe Fum-Vit C-Vit B12-FA (HEMATOGEN FORTE PO) Take 1 tablet by mouth daily.      . folic acid (FOLVITE) 1 MG tablet Take 1 mg by mouth daily.      . phenytoin (DILANTIN) 200 MG ER capsule Take 200 mg by  mouth 2 (two) times daily.      . vitamin B-12 (CYANOCOBALAMIN) 1000 MCG tablet Take 1,000 mcg by mouth daily.      . Oral Wound Care Products (MUGARD) LIQD Use as directed 10 mLs in the mouth or throat 4 (four) times daily as needed (Sore throat).  240 mL  0    ALLERGIES:   has no known allergies.  REVIEW OF SYSTEMS:  The rest of the 14-point review of system was negative.   Filed Vitals:   06/06/12 0947  BP: 148/81  Pulse: 94  Temp: 97 F (36.1 C)  Resp: 18   Wt Readings from Last 3 Encounters:  06/06/12 126 lb 4.8 oz (57.289 kg)  01/25/12 127 lb 3.2 oz (57.698 kg)  09/21/11 124 lb 6.4 oz (56.427 kg)   ECOG Performance status: 0  PHYSICAL EXAMINATION:   General:  Thin-appearing man, in no acute distress.  Eyes:  no scleral icterus.  ENT:  There were no oropharyngeal lesions on my unaided exam.  Neck was without thyromegaly.  Lymphatics:  Negative cervical, supraclavicular or axillary adenopathy.  Respiratory: lungs were clear bilaterally without wheezing or crackles.  Cardiovascular:  Regular rate and rhythm, S1/S2, without murmur, rub or gallop.  There was no pedal edema.  GI:  abdomen was soft, flat, nontender, nondistended, without organomegaly.  Muscoloskeletal:  no spinal tenderness of palpation of vertebral spine.  Skin exam was without echymosis, petichae.  Neuro exam was nonfocal.  Patient was  able to get on and off exam table without assistance.  Gait was normal.  Patient was alerted and oriented.  Attention was good.   Language was appropriate.  Mood was normal without depression.  Speech was not pressured.  Thought content was not tangential.         LABORATORY/RADIOLOGY DATA:  Lab Results  Component Value Date   WBC 3.7* 05/31/2012   HGB 10.2* 05/31/2012   HCT 29.9* 05/31/2012   PLT 236 05/31/2012   GLUCOSE 77 05/31/2012   ALKPHOS 149 05/31/2012   ALT 7 05/31/2012   AST 10 05/31/2012   NA 138 05/31/2012   K 4.1 05/31/2012   CL 107 05/31/2012   CREATININE 0.8  05/31/2012   BUN 20.0 05/31/2012   CO2 22 05/31/2012     ASSESSMENT AND PLAN:     1.  Diagnosis:  Anemia of chronic disease vs. Early stage of Myelodysplastic syndrome vs. Vitamin B12 deficiency -  I recommend to continue with Aranesp injection.  I changed this to every 4 weeks.  I advised him to continue with oral VitB12 supplementations.  In the future,  if despite Aranesp injections and blood counts drop, then we may consider repeating bone marrow biopsy.  2.  Persistent sore throat since 02/2012 in patient with history of smoking.  I referred him to ENT to ensure no head/neck pathology.  He requested mouth rinse sample for symptom relief.  I provided a sample of MuGuard.  3.  Follow up:  In about 4 months.

## 2012-06-27 ENCOUNTER — Other Ambulatory Visit: Payer: BC Managed Care – PPO | Admitting: Lab

## 2012-06-27 ENCOUNTER — Ambulatory Visit (HOSPITAL_BASED_OUTPATIENT_CLINIC_OR_DEPARTMENT_OTHER): Payer: BC Managed Care – PPO

## 2012-06-27 ENCOUNTER — Ambulatory Visit: Payer: BC Managed Care – PPO

## 2012-06-27 VITALS — BP 130/81 | HR 92 | Temp 97.9°F

## 2012-06-27 DIAGNOSIS — D4620 Refractory anemia with excess of blasts, unspecified: Secondary | ICD-10-CM

## 2012-06-27 DIAGNOSIS — D649 Anemia, unspecified: Secondary | ICD-10-CM

## 2012-06-27 LAB — CBC WITH DIFFERENTIAL/PLATELET
BASO%: 1 % (ref 0.0–2.0)
Eosinophils Absolute: 0 10*3/uL (ref 0.0–0.5)
MCHC: 34.5 g/dL (ref 32.0–36.0)
MONO#: 0.5 10*3/uL (ref 0.1–0.9)
NEUT#: 2.4 10*3/uL (ref 1.5–6.5)
RBC: 2.54 10*6/uL — ABNORMAL LOW (ref 4.20–5.82)
RDW: 16.2 % — ABNORMAL HIGH (ref 11.0–14.6)
WBC: 4.1 10*3/uL (ref 4.0–10.3)
lymph#: 1.1 10*3/uL (ref 0.9–3.3)

## 2012-06-27 MED ORDER — DARBEPOETIN ALFA-POLYSORBATE 300 MCG/0.6ML IJ SOLN
300.0000 ug | Freq: Once | INTRAMUSCULAR | Status: AC
  Administered 2012-06-27: 300 ug via SUBCUTANEOUS
  Filled 2012-06-27: qty 0.6

## 2012-07-18 ENCOUNTER — Other Ambulatory Visit: Payer: BC Managed Care – PPO | Admitting: Lab

## 2012-07-18 ENCOUNTER — Other Ambulatory Visit (HOSPITAL_BASED_OUTPATIENT_CLINIC_OR_DEPARTMENT_OTHER): Payer: BC Managed Care – PPO | Admitting: Lab

## 2012-07-18 ENCOUNTER — Ambulatory Visit: Payer: BC Managed Care – PPO

## 2012-07-18 ENCOUNTER — Ambulatory Visit (HOSPITAL_BASED_OUTPATIENT_CLINIC_OR_DEPARTMENT_OTHER): Payer: BC Managed Care – PPO

## 2012-07-18 VITALS — BP 129/74 | HR 92 | Temp 98.2°F

## 2012-07-18 DIAGNOSIS — D4620 Refractory anemia with excess of blasts, unspecified: Secondary | ICD-10-CM

## 2012-07-18 DIAGNOSIS — D649 Anemia, unspecified: Secondary | ICD-10-CM

## 2012-07-18 LAB — CBC WITH DIFFERENTIAL/PLATELET
EOS%: 1.5 % (ref 0.0–7.0)
HCT: 28.7 % — ABNORMAL LOW (ref 38.4–49.9)
HGB: 10.1 g/dL — ABNORMAL LOW (ref 13.0–17.1)
LYMPH%: 24.3 % (ref 14.0–49.0)
MCHC: 35.2 g/dL (ref 32.0–36.0)
MONO%: 15.1 % — ABNORMAL HIGH (ref 0.0–14.0)
NEUT#: 2.2 10*3/uL (ref 1.5–6.5)
Platelets: 193 10*3/uL (ref 140–400)
RDW: 17.2 % — ABNORMAL HIGH (ref 11.0–14.6)
WBC: 3.8 10*3/uL — ABNORMAL LOW (ref 4.0–10.3)

## 2012-07-18 MED ORDER — DARBEPOETIN ALFA-POLYSORBATE 300 MCG/0.6ML IJ SOLN
300.0000 ug | Freq: Once | INTRAMUSCULAR | Status: AC
  Administered 2012-07-18: 300 ug via SUBCUTANEOUS
  Filled 2012-07-18: qty 0.6

## 2012-07-18 NOTE — Patient Instructions (Addendum)
Darbepoetin Alfa injection What is this medicine? DARBEPOETIN ALFA (dar be POE e tin AL fa) helps your body make more red blood cells. It is used to treat anemia caused by chronic kidney failure and chemotherapy. This medicine may be used for other purposes; ask your health care Cederic Mozley or pharmacist if you have questions. What should I tell my health care Romario Tith before I take this medicine? They need to know if you have any of these conditions: -blood clotting disorders or history of blood clots -cancer patient not on chemotherapy -cystic fibrosis -heart disease, such as angina, heart failure, or a history of a heart attack -hemoglobin level of 12 g/dL or greater -high blood pressure -low levels of folate, iron, or vitamin B12 -seizures -an unusual or allergic reaction to darbepoetin, erythropoietin, albumin, hamster proteins, latex, other medicines, foods, dyes, or preservatives -pregnant or trying to get pregnant -breast-feeding How should I use this medicine? This medicine is for injection into a vein or under the skin. It is usually given by a health care professional in a hospital or clinic setting. If you get this medicine at home, you will be taught how to prepare and give this medicine. Do not shake the solution before you withdraw a dose. Use exactly as directed. Take your medicine at regular intervals. Do not take your medicine more often than directed. It is important that you put your used needles and syringes in a special sharps container. Do not put them in a trash can. If you do not have a sharps container, call your pharmacist or healthcare Guilherme Schwenke to get one. Talk to your pediatrician regarding the use of this medicine in children. While this medicine may be used in children as young as 1 year for selected conditions, precautions do apply. Overdosage: If you think you have taken too much of this medicine contact a poison control center or emergency room at once. NOTE:  This medicine is only for you. Do not share this medicine with others. What if I miss a dose? If you miss a dose, take it as soon as you can. If it is almost time for your next dose, take only that dose. Do not take double or extra doses. What may interact with this medicine? Do not take this medicine with any of the following medications: -epoetin alfa This list may not describe all possible interactions. Give your health care Demitrus Francisco a list of all the medicines, herbs, non-prescription drugs, or dietary supplements you use. Also tell them if you smoke, drink alcohol, or use illegal drugs. Some items may interact with your medicine. What should I watch for while using this medicine? Visit your prescriber or health care professional for regular checks on your progress and for the needed blood tests and blood pressure measurements. It is especially important for the doctor to make sure your hemoglobin level is in the desired range, to limit the risk of potential side effects and to give you the best benefit. Keep all appointments for any recommended tests. Check your blood pressure as directed. Ask your doctor what your blood pressure should be and when you should contact him or her. As your body makes more red blood cells, you may need to take iron, folic acid, or vitamin B supplements. Ask your doctor or health care Keontre Defino which products are right for you. If you have kidney disease continue dietary restrictions, even though this medication can make you feel better. Talk with your doctor or health care professional about the   foods you eat and the vitamins that you take. What side effects may I notice from receiving this medicine? Side effects that you should report to your doctor or health care professional as soon as possible: -allergic reactions like skin rash, itching or hives, swelling of the face, lips, or tongue -breathing problems -changes in vision -chest pain -confusion, trouble speaking  or understanding -feeling faint or lightheaded, falls -high blood pressure -muscle aches or pains -pain, swelling, warmth in the leg -rapid weight gain -severe headaches -sudden numbness or weakness of the face, arm or leg -trouble walking, dizziness, loss of balance or coordination -seizures (convulsions) -swelling of the ankles, feet, hands -unusually weak or tired Side effects that usually do not require medical attention (report to your doctor or health care professional if they continue or are bothersome): -diarrhea -fever, chills (flu-like symptoms) -headaches -nausea, vomiting -redness, stinging, or swelling at site where injected This list may not describe all possible side effects. Call your doctor for medical advice about side effects. You may report side effects to FDA at 1-800-FDA-1088. Where should I keep my medicine? Keep out of the reach of children. Store in a refrigerator between 2 and 8 degrees C (36 and 46 degrees F). Do not freeze. Do not shake. Throw away any unused portion if using a single-dose vial. Throw away any unused medicine after the expiration date. NOTE: This sheet is a summary. It may not cover all possible information. If you have questions about this medicine, talk to your doctor, pharmacist, or health care Iyah Laguna.  2013, [employer]. (04/08/2008 10:23:57 AM)  

## 2012-08-08 ENCOUNTER — Other Ambulatory Visit (HOSPITAL_BASED_OUTPATIENT_CLINIC_OR_DEPARTMENT_OTHER): Payer: BC Managed Care – PPO | Admitting: Lab

## 2012-08-08 ENCOUNTER — Ambulatory Visit (HOSPITAL_BASED_OUTPATIENT_CLINIC_OR_DEPARTMENT_OTHER): Payer: BC Managed Care – PPO

## 2012-08-08 ENCOUNTER — Other Ambulatory Visit: Payer: Self-pay | Admitting: Oncology

## 2012-08-08 ENCOUNTER — Other Ambulatory Visit: Payer: BC Managed Care – PPO | Admitting: Lab

## 2012-08-08 ENCOUNTER — Ambulatory Visit: Payer: BC Managed Care – PPO

## 2012-08-08 VITALS — BP 132/78 | HR 102 | Temp 98.7°F

## 2012-08-08 DIAGNOSIS — D4620 Refractory anemia with excess of blasts, unspecified: Secondary | ICD-10-CM

## 2012-08-08 DIAGNOSIS — D649 Anemia, unspecified: Secondary | ICD-10-CM

## 2012-08-08 LAB — CBC WITH DIFFERENTIAL/PLATELET
BASO%: 0.9 % (ref 0.0–2.0)
Eosinophils Absolute: 0.1 10*3/uL (ref 0.0–0.5)
MCHC: 35.3 g/dL (ref 32.0–36.0)
MCV: 115.9 fL — ABNORMAL HIGH (ref 79.3–98.0)
MONO#: 0.6 10*3/uL (ref 0.1–0.9)
MONO%: 10.6 % (ref 0.0–14.0)
NEUT#: 3.9 10*3/uL (ref 1.5–6.5)
RBC: 2.49 10*6/uL — ABNORMAL LOW (ref 4.20–5.82)
RDW: 17.5 % — ABNORMAL HIGH (ref 11.0–14.6)
WBC: 6.1 10*3/uL (ref 4.0–10.3)

## 2012-08-08 MED ORDER — DARBEPOETIN ALFA-POLYSORBATE 300 MCG/0.6ML IJ SOLN: 300.0000 ug | Freq: Once | INTRAMUSCULAR | Status: DC

## 2012-08-08 MED ORDER — DARBEPOETIN ALFA-POLYSORBATE 300 MCG/0.6ML IJ SOLN
300.0000 ug | Freq: Once | INTRAMUSCULAR | Status: AC
  Administered 2012-08-08: 300 ug via SUBCUTANEOUS
  Filled 2012-08-08: qty 0.6

## 2012-08-08 NOTE — Patient Instructions (Addendum)
Darbepoetin Alfa injection What is this medicine? DARBEPOETIN ALFA (dar be POE e tin AL fa) helps your body make more red blood cells. It is used to treat anemia caused by chronic kidney failure and chemotherapy. This medicine may be used for other purposes; ask your health care provider or pharmacist if you have questions. What should I tell my health care provider before I take this medicine? They need to know if you have any of these conditions: -blood clotting disorders or history of blood clots -cancer patient not on chemotherapy -cystic fibrosis -heart disease, such as angina, heart failure, or a history of a heart attack -hemoglobin level of 12 g/dL or greater -high blood pressure -low levels of folate, iron, or vitamin B12 -seizures -an unusual or allergic reaction to darbepoetin, erythropoietin, albumin, hamster proteins, latex, other medicines, foods, dyes, or preservatives -pregnant or trying to get pregnant -breast-feeding How should I use this medicine? This medicine is for injection into a vein or under the skin. It is usually given by a health care professional in a hospital or clinic setting. If you get this medicine at home, you will be taught how to prepare and give this medicine. Do not shake the solution before you withdraw a dose. Use exactly as directed. Take your medicine at regular intervals. Do not take your medicine more often than directed. It is important that you put your used needles and syringes in a special sharps container. Do not put them in a trash can. If you do not have a sharps container, call your pharmacist or healthcare provider to get one. Talk to your pediatrician regarding the use of this medicine in children. While this medicine may be used in children as young as 1 year for selected conditions, precautions do apply. Overdosage: If you think you have taken too much of this medicine contact a poison control center or emergency room at once. NOTE:  This medicine is only for you. Do not share this medicine with others. What if I miss a dose? If you miss a dose, take it as soon as you can. If it is almost time for your next dose, take only that dose. Do not take double or extra doses. What may interact with this medicine? Do not take this medicine with any of the following medications: -epoetin alfa This list may not describe all possible interactions. Give your health care provider a list of all the medicines, herbs, non-prescription drugs, or dietary supplements you use. Also tell them if you smoke, drink alcohol, or use illegal drugs. Some items may interact with your medicine. What should I watch for while using this medicine? Visit your prescriber or health care professional for regular checks on your progress and for the needed blood tests and blood pressure measurements. It is especially important for the doctor to make sure your hemoglobin level is in the desired range, to limit the risk of potential side effects and to give you the best benefit. Keep all appointments for any recommended tests. Check your blood pressure as directed. Ask your doctor what your blood pressure should be and when you should contact him or her. As your body makes more red blood cells, you may need to take iron, folic acid, or vitamin B supplements. Ask your doctor or health care provider which products are right for you. If you have kidney disease continue dietary restrictions, even though this medication can make you feel better. Talk with your doctor or health care professional about the   foods you eat and the vitamins that you take. What side effects may I notice from receiving this medicine? Side effects that you should report to your doctor or health care professional as soon as possible: -allergic reactions like skin rash, itching or hives, swelling of the face, lips, or tongue -breathing problems -changes in vision -chest pain -confusion, trouble speaking  or understanding -feeling faint or lightheaded, falls -high blood pressure -muscle aches or pains -pain, swelling, warmth in the leg -rapid weight gain -severe headaches -sudden numbness or weakness of the face, arm or leg -trouble walking, dizziness, loss of balance or coordination -seizures (convulsions) -swelling of the ankles, feet, hands -unusually weak or tired Side effects that usually do not require medical attention (report to your doctor or health care professional if they continue or are bothersome): -diarrhea -fever, chills (flu-like symptoms) -headaches -nausea, vomiting -redness, stinging, or swelling at site where injected This list may not describe all possible side effects. Call your doctor for medical advice about side effects. You may report side effects to FDA at 1-800-FDA-1088. Where should I keep my medicine? Keep out of the reach of children. Store in a refrigerator between 2 and 8 degrees C (36 and 46 degrees F). Do not freeze. Do not shake. Throw away any unused portion if using a single-dose vial. Throw away any unused medicine after the expiration date. NOTE: This sheet is a summary. It may not cover all possible information. If you have questions about this medicine, talk to your doctor, pharmacist, or health care provider.  2013, [employer]. (04/08/2008 10:23:57 AM)  

## 2012-08-27 ENCOUNTER — Encounter: Payer: Self-pay | Admitting: Oncology

## 2012-08-28 ENCOUNTER — Other Ambulatory Visit: Payer: Self-pay | Admitting: Oncology

## 2012-08-28 DIAGNOSIS — D4620 Refractory anemia with excess of blasts, unspecified: Secondary | ICD-10-CM

## 2012-08-29 ENCOUNTER — Other Ambulatory Visit (HOSPITAL_BASED_OUTPATIENT_CLINIC_OR_DEPARTMENT_OTHER): Payer: BC Managed Care – PPO | Admitting: Lab

## 2012-08-29 ENCOUNTER — Ambulatory Visit (HOSPITAL_BASED_OUTPATIENT_CLINIC_OR_DEPARTMENT_OTHER): Payer: BC Managed Care – PPO

## 2012-08-29 ENCOUNTER — Ambulatory Visit: Payer: BC Managed Care – PPO

## 2012-08-29 ENCOUNTER — Other Ambulatory Visit: Payer: BC Managed Care – PPO | Admitting: Lab

## 2012-08-29 VITALS — BP 129/73 | HR 99 | Temp 98.1°F

## 2012-08-29 DIAGNOSIS — D4620 Refractory anemia with excess of blasts, unspecified: Secondary | ICD-10-CM

## 2012-08-29 DIAGNOSIS — D46Z Other myelodysplastic syndromes: Secondary | ICD-10-CM

## 2012-08-29 LAB — CBC WITH DIFFERENTIAL/PLATELET
BASO%: 1.2 % (ref 0.0–2.0)
Eosinophils Absolute: 0 10*3/uL (ref 0.0–0.5)
LYMPH%: 18.7 % (ref 14.0–49.0)
MCHC: 34.7 g/dL (ref 32.0–36.0)
MCV: 117.7 fL — ABNORMAL HIGH (ref 79.3–98.0)
MONO#: 0.6 10*3/uL (ref 0.1–0.9)
MONO%: 12.8 % (ref 0.0–14.0)
NEUT#: 3.2 10*3/uL (ref 1.5–6.5)
Platelets: 305 10*3/uL (ref 140–400)
RBC: 2.42 10*6/uL — ABNORMAL LOW (ref 4.20–5.82)
RDW: 18.2 % — ABNORMAL HIGH (ref 11.0–14.6)
WBC: 4.9 10*3/uL (ref 4.0–10.3)

## 2012-08-29 MED ORDER — DARBEPOETIN ALFA-POLYSORBATE 300 MCG/0.6ML IJ SOLN
300.0000 ug | Freq: Once | INTRAMUSCULAR | Status: AC
Start: 2012-08-29 — End: 2012-08-29
  Administered 2012-08-29: 300 ug via SUBCUTANEOUS
  Filled 2012-08-29: qty 0.6

## 2012-09-18 ENCOUNTER — Other Ambulatory Visit: Payer: Self-pay | Admitting: Oncology

## 2012-09-18 DIAGNOSIS — D4620 Refractory anemia with excess of blasts, unspecified: Secondary | ICD-10-CM

## 2012-09-19 ENCOUNTER — Ambulatory Visit: Payer: BC Managed Care – PPO

## 2012-09-19 ENCOUNTER — Encounter: Payer: Self-pay | Admitting: Oncology

## 2012-09-19 ENCOUNTER — Other Ambulatory Visit: Payer: BC Managed Care – PPO | Admitting: Lab

## 2012-09-19 ENCOUNTER — Ambulatory Visit (HOSPITAL_BASED_OUTPATIENT_CLINIC_OR_DEPARTMENT_OTHER): Payer: BC Managed Care – PPO | Admitting: Oncology

## 2012-09-19 ENCOUNTER — Ambulatory Visit (HOSPITAL_BASED_OUTPATIENT_CLINIC_OR_DEPARTMENT_OTHER): Payer: BC Managed Care – PPO

## 2012-09-19 ENCOUNTER — Other Ambulatory Visit (HOSPITAL_BASED_OUTPATIENT_CLINIC_OR_DEPARTMENT_OTHER): Payer: BC Managed Care – PPO | Admitting: Lab

## 2012-09-19 VITALS — BP 145/88 | HR 103 | Temp 98.0°F | Resp 18 | Ht 71.0 in | Wt 122.5 lb

## 2012-09-19 DIAGNOSIS — F17200 Nicotine dependence, unspecified, uncomplicated: Secondary | ICD-10-CM

## 2012-09-19 DIAGNOSIS — D4620 Refractory anemia with excess of blasts, unspecified: Secondary | ICD-10-CM

## 2012-09-19 DIAGNOSIS — D649 Anemia, unspecified: Secondary | ICD-10-CM

## 2012-09-19 LAB — CBC WITH DIFFERENTIAL/PLATELET
Eosinophils Absolute: 0 10*3/uL (ref 0.0–0.5)
MONO#: 0.5 10*3/uL (ref 0.1–0.9)
NEUT#: 3.3 10*3/uL (ref 1.5–6.5)
RBC: 2.33 10*6/uL — ABNORMAL LOW (ref 4.20–5.82)
RDW: 17.5 % — ABNORMAL HIGH (ref 11.0–14.6)
WBC: 4.7 10*3/uL (ref 4.0–10.3)
lymph#: 0.9 10*3/uL (ref 0.9–3.3)

## 2012-09-19 LAB — COMPREHENSIVE METABOLIC PANEL (CC13)
Albumin: 2.8 g/dL — ABNORMAL LOW (ref 3.5–5.0)
Alkaline Phosphatase: 171 U/L — ABNORMAL HIGH (ref 40–150)
CO2: 18 mEq/L — ABNORMAL LOW (ref 22–29)
Glucose: 97 mg/dl (ref 70–99)
Potassium: 4.2 mEq/L (ref 3.5–5.1)
Sodium: 136 mEq/L (ref 136–145)
Total Protein: 8.2 g/dL (ref 6.4–8.3)

## 2012-09-19 MED ORDER — DARBEPOETIN ALFA-POLYSORBATE 300 MCG/0.6ML IJ SOLN
300.0000 ug | Freq: Once | INTRAMUSCULAR | Status: AC
  Administered 2012-09-19: 300 ug via SUBCUTANEOUS
  Filled 2012-09-19: qty 0.6

## 2012-09-19 NOTE — Progress Notes (Signed)
[facility]  [phone] [phone]) [phone]   OFFICE PROGRESS NOTE   Cc:  [name], MD  DIAGNOSIS:  Early MDS vs. Anemia of chronic disease and Vit B12 deficiency.   CURRENT THERAPY:  Aranesp injection and oral Vit B12 replacement.   INTERVAL HISTORY: Adrian [name] 62 y.o. male returns for regular follow up. He was seen by ENT for sore throat. States they performed a laryngoscopy and that there was no evidence of cancer. States he was instructed to decrease his coffee intake intake and to stop smoking. He states his pain is much better at this time.  States he still smokes 5 cigarettes per day. He wants to quit and has access to nicotine patches which he will begin in the near future. He denied palpable neck nodes.  He reports mild fatigue. He remains independent in ADLs. Denies any side effects from his Aranesp injections.  Patient denies fever, anorexia, weight loss, fatigue, headache, visual changes, confusion, drenching night sweats,nausea vomiting, jaundice, chest pain, palpitation, shortness of breath, dyspnea on exertion, productive cough, gum bleeding, epistaxis, hematemesis, hemoptysis, abdominal pain, abdominal swelling, early satiety, melena, hematochezia, hematuria, skin rash, spontaneous bleeding, joint swelling, joint pain, heat or cold intolerance, bowel bladder incontinence, back pain, focal motor weakness, paresthesia.     Past Medical History  Diagnosis Date  . Seizures   . Megaloblastic anemia   . Emphysema of lung     Past Surgical History  Procedure Laterality Date  . Abdominal hernia repair      Current Outpatient Prescriptions  Medication Sig Dispense Refill  . budesonide-formoterol (SYMBICORT) 160-4.5 MCG/ACT inhaler Inhale 2 puffs into the lungs 2 (two) times daily.      . diazepam (VALIUM) 5 MG tablet Take 5 mg by mouth daily.      . Fe Fum-Vit C-Vit B12-FA (HEMATOGEN FORTE PO) Take 1 tablet by mouth daily.      .  folic acid (FOLVITE) 1 MG tablet Take 1 mg by mouth daily.      . Oral Wound Care Products (MUGARD) LIQD Use as directed 10 mLs in the mouth or throat 4 (four) times daily as needed (Sore throat).  240 mL  0  . phenytoin (DILANTIN) 200 MG ER capsule Take 200 mg by mouth 2 (two) times daily.      . vitamin B-12 (CYANOCOBALAMIN) 1000 MCG tablet Take 1,000 mcg by mouth daily.       No current facility-administered medications for this visit.    ALLERGIES:  has No Known Allergies.  REVIEW OF SYSTEMS:  The rest of the 14-point review of system was negative.   Filed Vitals:   09/19/12 1335  BP: 145/88  Pulse: 103  Temp: 98 F (36.7 C)  Resp: 18   Wt Readings from Last 3 Encounters:  09/19/12 122 lb 8 oz (55.566 kg)  06/06/12 126 lb 4.8 oz (57.289 kg)  01/25/12 127 lb 3.2 oz (57.698 kg)   ECOG Performance status: 0  PHYSICAL EXAMINATION:   General:  Thin-appearing man, in no acute distress.  Eyes:  no scleral icterus.  ENT:  There were no oropharyngeal lesions on my unaided exam.  Neck was without thyromegaly.  Lymphatics:  Negative cervical, supraclavicular or axillary adenopathy.  Respiratory: lungs were clear bilaterally without wheezing or crackles.  Cardiovascular:  Regular rate and rhythm, S1/S2, without murmur, rub or gallop.  There was no pedal edema.  GI:  abdomen was soft, flat, nontender, nondistended, without organomegaly.  Muscoloskeletal:  no spinal tenderness of palpation of vertebral spine.  Skin exam was without echymosis, petichae.  Neuro exam was nonfocal.  Patient was able to get on and off exam table without assistance.  Gait was normal.  Patient was alerted and oriented.  Attention was good.   Language was appropriate.  Mood was normal without depression.  Speech was not pressured.  Thought content was not tangential.     LABORATORY/RADIOLOGY DATA:  Lab Results  Component Value Date   WBC 4.7 09/19/2012   HGB 9.5* 09/19/2012   HCT 27.5* 09/19/2012   PLT 303  09/19/2012   GLUCOSE 97 09/19/2012   ALKPHOS 171* 09/19/2012   ALT <6 Repeated and Verified 09/19/2012   AST 9 09/19/2012   NA 136 09/19/2012   K 4.2 09/19/2012   CL 107 09/19/2012   CREATININE 0.8 09/19/2012   BUN 13.2 09/19/2012   CO2 18* 09/19/2012     ASSESSMENT AND PLAN:     1.  Diagnosis:  Anemia of chronic disease vs. Early stage of Myelodysplastic syndrome vs. Vitamin B12 deficiency -  I recommend to continue with Aranesp injection. I advised him to continue with oral VitB12 supplementations.  In the future,  if despite Aranesp injections and blood counts drop, then we may consider repeating bone marrow biopsy.  2.  Persistent sore throat since 02/2012 in patient with history of smoking.  Workup by ENT was negative. His pain has now resolved.  3.  Follow up:  In about 4 months.

## 2012-10-10 ENCOUNTER — Other Ambulatory Visit: Payer: BC Managed Care – PPO | Admitting: Lab

## 2012-10-10 ENCOUNTER — Ambulatory Visit (HOSPITAL_BASED_OUTPATIENT_CLINIC_OR_DEPARTMENT_OTHER): Payer: BC Managed Care – PPO

## 2012-10-10 ENCOUNTER — Ambulatory Visit: Payer: BC Managed Care – PPO

## 2012-10-10 ENCOUNTER — Other Ambulatory Visit (HOSPITAL_BASED_OUTPATIENT_CLINIC_OR_DEPARTMENT_OTHER): Payer: BC Managed Care – PPO | Admitting: Lab

## 2012-10-10 ENCOUNTER — Other Ambulatory Visit: Payer: Self-pay | Admitting: Oncology

## 2012-10-10 VITALS — BP 139/74 | HR 101 | Temp 98.8°F

## 2012-10-10 DIAGNOSIS — D4620 Refractory anemia with excess of blasts, unspecified: Secondary | ICD-10-CM

## 2012-10-10 DIAGNOSIS — D469 Myelodysplastic syndrome, unspecified: Secondary | ICD-10-CM

## 2012-10-10 LAB — CBC WITH DIFFERENTIAL/PLATELET
BASO%: 0.7 % (ref 0.0–2.0)
EOS%: 0.7 % (ref 0.0–7.0)
Eosinophils Absolute: 0 10*3/uL (ref 0.0–0.5)
MCV: 117.7 fL — ABNORMAL HIGH (ref 79.3–98.0)
MONO%: 12.6 % (ref 0.0–14.0)
NEUT#: 2.8 10*3/uL (ref 1.5–6.5)
RBC: 2.25 10*6/uL — ABNORMAL LOW (ref 4.20–5.82)
RDW: 17.7 % — ABNORMAL HIGH (ref 11.0–14.6)
WBC: 4.6 10*3/uL (ref 4.0–10.3)

## 2012-10-10 MED ORDER — DARBEPOETIN ALFA-POLYSORBATE 300 MCG/0.6ML IJ SOLN
300.0000 ug | Freq: Once | INTRAMUSCULAR | Status: AC
  Administered 2012-10-10: 300 ug via SUBCUTANEOUS
  Filled 2012-10-10: qty 0.6

## 2012-10-31 ENCOUNTER — Ambulatory Visit (HOSPITAL_BASED_OUTPATIENT_CLINIC_OR_DEPARTMENT_OTHER): Payer: BC Managed Care – PPO

## 2012-10-31 ENCOUNTER — Other Ambulatory Visit: Payer: BC Managed Care – PPO | Admitting: Lab

## 2012-10-31 ENCOUNTER — Ambulatory Visit: Payer: BC Managed Care – PPO

## 2012-10-31 ENCOUNTER — Other Ambulatory Visit (HOSPITAL_BASED_OUTPATIENT_CLINIC_OR_DEPARTMENT_OTHER): Payer: BC Managed Care – PPO | Admitting: Lab

## 2012-10-31 VITALS — BP 128/76 | HR 109 | Temp 98.7°F

## 2012-10-31 DIAGNOSIS — D469 Myelodysplastic syndrome, unspecified: Secondary | ICD-10-CM

## 2012-10-31 DIAGNOSIS — D4620 Refractory anemia with excess of blasts, unspecified: Secondary | ICD-10-CM

## 2012-10-31 LAB — CBC WITH DIFFERENTIAL/PLATELET
Basophils Absolute: 0 10*3/uL (ref 0.0–0.1)
Eosinophils Absolute: 0.1 10*3/uL (ref 0.0–0.5)
HGB: 9.4 g/dL — ABNORMAL LOW (ref 13.0–17.1)
LYMPH%: 23.7 % (ref 14.0–49.0)
MCV: 117.5 fL — ABNORMAL HIGH (ref 79.3–98.0)
MONO%: 10.6 % (ref 0.0–14.0)
NEUT#: 3.2 10*3/uL (ref 1.5–6.5)
Platelets: 269 10*3/uL (ref 140–400)

## 2012-10-31 MED ORDER — DARBEPOETIN ALFA-POLYSORBATE 300 MCG/0.6ML IJ SOLN
300.0000 ug | Freq: Once | INTRAMUSCULAR | Status: AC
  Administered 2012-10-31: 300 ug via SUBCUTANEOUS
  Filled 2012-10-31: qty 0.6

## 2012-10-31 NOTE — Patient Instructions (Addendum)
Darbepoetin Alfa injection What is this medicine? DARBEPOETIN ALFA (dar be POE e tin AL fa) helps your body make more red blood cells. It is used to treat anemia caused by chronic kidney failure and chemotherapy. This medicine may be used for other purposes; ask your health care provider or pharmacist if you have questions. What should I tell my health care provider before I take this medicine? They need to know if you have any of these conditions: -blood clotting disorders or history of blood clots -cancer patient not on chemotherapy -cystic fibrosis -heart disease, such as angina, heart failure, or a history of a heart attack -hemoglobin level of 12 g/dL or greater -high blood pressure -low levels of folate, iron, or vitamin B12 -seizures -an unusual or allergic reaction to darbepoetin, erythropoietin, albumin, hamster proteins, latex, other medicines, foods, dyes, or preservatives -pregnant or trying to get pregnant -breast-feeding How should I use this medicine? This medicine is for injection into a vein or under the skin. It is usually given by a health care professional in a hospital or clinic setting. If you get this medicine at home, you will be taught how to prepare and give this medicine. Do not shake the solution before you withdraw a dose. Use exactly as directed. Take your medicine at regular intervals. Do not take your medicine more often than directed. It is important that you put your used needles and syringes in a special sharps container. Do not put them in a trash can. If you do not have a sharps container, call your pharmacist or healthcare provider to get one. Talk to your pediatrician regarding the use of this medicine in children. While this medicine may be used in children as young as 1 year for selected conditions, precautions do apply. Overdosage: If you think you have taken too much of this medicine contact a poison control center or emergency room at once. NOTE:  This medicine is only for you. Do not share this medicine with others. What if I miss a dose? If you miss a dose, take it as soon as you can. If it is almost time for your next dose, take only that dose. Do not take double or extra doses. What may interact with this medicine? Do not take this medicine with any of the following medications: -epoetin alfa This list may not describe all possible interactions. Give your health care provider a list of all the medicines, herbs, non-prescription drugs, or dietary supplements you use. Also tell them if you smoke, drink alcohol, or use illegal drugs. Some items may interact with your medicine. What should I watch for while using this medicine? Visit your prescriber or health care professional for regular checks on your progress and for the needed blood tests and blood pressure measurements. It is especially important for the doctor to make sure your hemoglobin level is in the desired range, to limit the risk of potential side effects and to give you the best benefit. Keep all appointments for any recommended tests. Check your blood pressure as directed. Ask your doctor what your blood pressure should be and when you should contact him or her. As your body makes more red blood cells, you may need to take iron, folic acid, or vitamin B supplements. Ask your doctor or health care provider which products are right for you. If you have kidney disease continue dietary restrictions, even though this medication can make you feel better. Talk with your doctor or health care professional about the   foods you eat and the vitamins that you take. What side effects may I notice from receiving this medicine? Side effects that you should report to your doctor or health care professional as soon as possible: -allergic reactions like skin rash, itching or hives, swelling of the face, lips, or tongue -breathing problems -changes in vision -chest pain -confusion, trouble speaking  or understanding -feeling faint or lightheaded, falls -high blood pressure -muscle aches or pains -pain, swelling, warmth in the leg -rapid weight gain -severe headaches -sudden numbness or weakness of the face, arm or leg -trouble walking, dizziness, loss of balance or coordination -seizures (convulsions) -swelling of the ankles, feet, hands -unusually weak or tired Side effects that usually do not require medical attention (report to your doctor or health care professional if they continue or are bothersome): -diarrhea -fever, chills (flu-like symptoms) -headaches -nausea, vomiting -redness, stinging, or swelling at site where injected This list may not describe all possible side effects. Call your doctor for medical advice about side effects. You may report side effects to FDA at 1-800-FDA-1088. Where should I keep my medicine? Keep out of the reach of children. Store in a refrigerator between 2 and 8 degrees C (36 and 46 degrees F). Do not freeze. Do not shake. Throw away any unused portion if using a single-dose vial. Throw away any unused medicine after the expiration date. NOTE: This sheet is a summary. It may not cover all possible information. If you have questions about this medicine, talk to your doctor, pharmacist, or health care provider.  2013, [employer]. (04/08/2008 10:23:57 AM)  

## 2012-11-21 ENCOUNTER — Ambulatory Visit (HOSPITAL_BASED_OUTPATIENT_CLINIC_OR_DEPARTMENT_OTHER): Payer: BC Managed Care – PPO

## 2012-11-21 ENCOUNTER — Other Ambulatory Visit: Payer: BC Managed Care – PPO | Admitting: Lab

## 2012-11-21 ENCOUNTER — Ambulatory Visit: Payer: BC Managed Care – PPO

## 2012-11-21 ENCOUNTER — Other Ambulatory Visit (HOSPITAL_BASED_OUTPATIENT_CLINIC_OR_DEPARTMENT_OTHER): Payer: BC Managed Care – PPO | Admitting: Lab

## 2012-11-21 VITALS — BP 126/65 | HR 106 | Temp 98.5°F

## 2012-11-21 DIAGNOSIS — D469 Myelodysplastic syndrome, unspecified: Secondary | ICD-10-CM

## 2012-11-21 DIAGNOSIS — D4620 Refractory anemia with excess of blasts, unspecified: Secondary | ICD-10-CM

## 2012-11-21 LAB — CBC WITH DIFFERENTIAL/PLATELET
Eosinophils Absolute: 0 10*3/uL (ref 0.0–0.5)
LYMPH%: 26.2 % (ref 14.0–49.0)
MCHC: 34.7 g/dL (ref 32.0–36.0)
MCV: 119.3 fL — ABNORMAL HIGH (ref 79.3–98.0)
MONO%: 11.7 % (ref 0.0–14.0)
NEUT#: 2.7 10*3/uL (ref 1.5–6.5)
Platelets: 281 10*3/uL (ref 140–400)
RBC: 2.37 10*6/uL — ABNORMAL LOW (ref 4.20–5.82)

## 2012-11-21 MED ORDER — DARBEPOETIN ALFA-POLYSORBATE 300 MCG/0.6ML IJ SOLN
300.0000 ug | Freq: Once | INTRAMUSCULAR | Status: AC
  Administered 2012-11-21: 300 ug via SUBCUTANEOUS
  Filled 2012-11-21: qty 0.6

## 2012-12-12 ENCOUNTER — Ambulatory Visit (HOSPITAL_BASED_OUTPATIENT_CLINIC_OR_DEPARTMENT_OTHER): Payer: BC Managed Care – PPO

## 2012-12-12 ENCOUNTER — Other Ambulatory Visit: Payer: BC Managed Care – PPO | Admitting: Lab

## 2012-12-12 ENCOUNTER — Other Ambulatory Visit (HOSPITAL_BASED_OUTPATIENT_CLINIC_OR_DEPARTMENT_OTHER): Payer: BC Managed Care – PPO | Admitting: Lab

## 2012-12-12 ENCOUNTER — Ambulatory Visit: Payer: BC Managed Care – PPO

## 2012-12-12 VITALS — BP 132/74 | HR 101 | Temp 98.6°F

## 2012-12-12 DIAGNOSIS — D4620 Refractory anemia with excess of blasts, unspecified: Secondary | ICD-10-CM

## 2012-12-12 DIAGNOSIS — D469 Myelodysplastic syndrome, unspecified: Secondary | ICD-10-CM

## 2012-12-12 LAB — CBC WITH DIFFERENTIAL/PLATELET
BASO%: 1 % (ref 0.0–2.0)
EOS%: 0.5 % (ref 0.0–7.0)
LYMPH%: 19.4 % (ref 14.0–49.0)
MCH: 41.7 pg — ABNORMAL HIGH (ref 27.2–33.4)
MCHC: 35.7 g/dL (ref 32.0–36.0)
MONO#: 0.5 10*3/uL (ref 0.1–0.9)
MONO%: 10.7 % (ref 0.0–14.0)
Platelets: 263 10*3/uL (ref 140–400)
RBC: 2.1 10*6/uL — ABNORMAL LOW (ref 4.20–5.82)
WBC: 4.6 10*3/uL (ref 4.0–10.3)

## 2012-12-12 MED ORDER — DARBEPOETIN ALFA-POLYSORBATE 300 MCG/0.6ML IJ SOLN
300.0000 ug | Freq: Once | INTRAMUSCULAR | Status: AC
  Administered 2012-12-12: 300 ug via SUBCUTANEOUS
  Filled 2012-12-12: qty 0.6

## 2013-01-02 ENCOUNTER — Ambulatory Visit: Payer: BC Managed Care – PPO

## 2013-01-02 ENCOUNTER — Other Ambulatory Visit (HOSPITAL_BASED_OUTPATIENT_CLINIC_OR_DEPARTMENT_OTHER): Payer: BC Managed Care – PPO | Admitting: Lab

## 2013-01-02 ENCOUNTER — Other Ambulatory Visit: Payer: BC Managed Care – PPO | Admitting: Lab

## 2013-01-02 ENCOUNTER — Ambulatory Visit (HOSPITAL_BASED_OUTPATIENT_CLINIC_OR_DEPARTMENT_OTHER): Payer: BC Managed Care – PPO

## 2013-01-02 VITALS — BP 130/78 | HR 97 | Temp 98.7°F

## 2013-01-02 DIAGNOSIS — D4620 Refractory anemia with excess of blasts, unspecified: Secondary | ICD-10-CM

## 2013-01-02 DIAGNOSIS — D469 Myelodysplastic syndrome, unspecified: Secondary | ICD-10-CM

## 2013-01-02 LAB — CBC WITH DIFFERENTIAL/PLATELET
BASO%: 0.7 % (ref 0.0–2.0)
EOS%: 1.2 % (ref 0.0–7.0)
HCT: 26.2 % — ABNORMAL LOW (ref 38.4–49.9)
MCHC: 33.6 g/dL (ref 32.0–36.0)
MONO#: 0.6 10*3/uL (ref 0.1–0.9)
RBC: 2.27 10*6/uL — ABNORMAL LOW (ref 4.20–5.82)
RDW: 16.9 % — ABNORMAL HIGH (ref 11.0–14.6)
WBC: 4.3 10*3/uL (ref 4.0–10.3)
lymph#: 1.2 10*3/uL (ref 0.9–3.3)
nRBC: 0 % (ref 0–0)

## 2013-01-02 MED ORDER — DARBEPOETIN ALFA-POLYSORBATE 300 MCG/0.6ML IJ SOLN
300.0000 ug | Freq: Once | INTRAMUSCULAR | Status: AC
  Administered 2013-01-02: 300 ug via SUBCUTANEOUS
  Filled 2013-01-02: qty 0.6

## 2013-01-07 DIAGNOSIS — J189 Pneumonia, unspecified organism: Secondary | ICD-10-CM

## 2013-01-07 HISTORY — DX: Pneumonia, unspecified organism: J18.9

## 2013-01-23 ENCOUNTER — Other Ambulatory Visit: Payer: BC Managed Care – PPO | Admitting: Lab

## 2013-01-23 ENCOUNTER — Inpatient Hospital Stay (HOSPITAL_COMMUNITY)
Admission: EM | Admit: 2013-01-23 | Discharge: 2013-02-12 | DRG: 878 | Disposition: A | Discharge status: Other Acute Inpatient Hospital | Payer: BC Managed Care – PPO | Attending: Pulmonary Disease | Admitting: Pulmonary Disease

## 2013-01-23 ENCOUNTER — Ambulatory Visit: Payer: BC Managed Care – PPO

## 2013-01-23 ENCOUNTER — Encounter (HOSPITAL_COMMUNITY): Payer: Self-pay | Admitting: Emergency Medicine

## 2013-01-23 ENCOUNTER — Ambulatory Visit: Payer: BC Managed Care – PPO | Admitting: Hematology and Oncology

## 2013-01-23 ENCOUNTER — Other Ambulatory Visit: Payer: Self-pay | Admitting: *Deleted

## 2013-01-23 ENCOUNTER — Inpatient Hospital Stay (HOSPITAL_COMMUNITY): Payer: BC Managed Care – PPO

## 2013-01-23 ENCOUNTER — Emergency Department (HOSPITAL_COMMUNITY): Payer: BC Managed Care – PPO

## 2013-01-23 DIAGNOSIS — I428 Other cardiomyopathies: Secondary | ICD-10-CM | POA: Diagnosis present

## 2013-01-23 DIAGNOSIS — Z79899 Other long term (current) drug therapy: Secondary | ICD-10-CM

## 2013-01-23 DIAGNOSIS — E871 Hypo-osmolality and hyponatremia: Secondary | ICD-10-CM | POA: Diagnosis present

## 2013-01-23 DIAGNOSIS — E876 Hypokalemia: Secondary | ICD-10-CM | POA: Diagnosis not present

## 2013-01-23 DIAGNOSIS — J449 Chronic obstructive pulmonary disease, unspecified: Secondary | ICD-10-CM

## 2013-01-23 DIAGNOSIS — D72829 Elevated white blood cell count, unspecified: Secondary | ICD-10-CM

## 2013-01-23 DIAGNOSIS — R7309 Other abnormal glucose: Secondary | ICD-10-CM | POA: Diagnosis not present

## 2013-01-23 DIAGNOSIS — J9600 Acute respiratory failure, unspecified whether with hypoxia or hypercapnia: Secondary | ICD-10-CM | POA: Diagnosis present

## 2013-01-23 DIAGNOSIS — Z930 Tracheostomy status: Secondary | ICD-10-CM

## 2013-01-23 DIAGNOSIS — R0682 Tachypnea, not elsewhere classified: Secondary | ICD-10-CM | POA: Diagnosis present

## 2013-01-23 DIAGNOSIS — J189 Pneumonia, unspecified organism: Secondary | ICD-10-CM

## 2013-01-23 DIAGNOSIS — J852 Abscess of lung without pneumonia: Secondary | ICD-10-CM | POA: Diagnosis present

## 2013-01-23 DIAGNOSIS — E860 Dehydration: Secondary | ICD-10-CM

## 2013-01-23 DIAGNOSIS — D46Z Other myelodysplastic syndromes: Secondary | ICD-10-CM

## 2013-01-23 DIAGNOSIS — R5381 Other malaise: Secondary | ICD-10-CM | POA: Diagnosis not present

## 2013-01-23 DIAGNOSIS — I214 Non-ST elevation (NSTEMI) myocardial infarction: Secondary | ICD-10-CM

## 2013-01-23 DIAGNOSIS — J4489 Other specified chronic obstructive pulmonary disease: Secondary | ICD-10-CM

## 2013-01-23 DIAGNOSIS — J9601 Acute respiratory failure with hypoxia: Secondary | ICD-10-CM | POA: Diagnosis present

## 2013-01-23 DIAGNOSIS — E43 Unspecified severe protein-calorie malnutrition: Secondary | ICD-10-CM

## 2013-01-23 DIAGNOSIS — D649 Anemia, unspecified: Secondary | ICD-10-CM

## 2013-01-23 DIAGNOSIS — R6520 Severe sepsis without septic shock: Secondary | ICD-10-CM

## 2013-01-23 DIAGNOSIS — E46 Unspecified protein-calorie malnutrition: Secondary | ICD-10-CM | POA: Diagnosis present

## 2013-01-23 DIAGNOSIS — I369 Nonrheumatic tricuspid valve disorder, unspecified: Secondary | ICD-10-CM

## 2013-01-23 DIAGNOSIS — G40909 Epilepsy, unspecified, not intractable, without status epilepticus: Secondary | ICD-10-CM

## 2013-01-23 DIAGNOSIS — J850 Gangrene and necrosis of lung: Secondary | ICD-10-CM

## 2013-01-23 DIAGNOSIS — R7989 Other specified abnormal findings of blood chemistry: Secondary | ICD-10-CM

## 2013-01-23 DIAGNOSIS — D4620 Refractory anemia with excess of blasts, unspecified: Secondary | ICD-10-CM

## 2013-01-23 DIAGNOSIS — D539 Nutritional anemia, unspecified: Secondary | ICD-10-CM | POA: Diagnosis present

## 2013-01-23 DIAGNOSIS — R0902 Hypoxemia: Secondary | ICD-10-CM | POA: Diagnosis present

## 2013-01-23 DIAGNOSIS — F1010 Alcohol abuse, uncomplicated: Secondary | ICD-10-CM | POA: Diagnosis present

## 2013-01-23 DIAGNOSIS — I10 Essential (primary) hypertension: Secondary | ICD-10-CM | POA: Diagnosis present

## 2013-01-23 DIAGNOSIS — R627 Adult failure to thrive: Secondary | ICD-10-CM | POA: Diagnosis not present

## 2013-01-23 DIAGNOSIS — R197 Diarrhea, unspecified: Secondary | ICD-10-CM | POA: Diagnosis not present

## 2013-01-23 DIAGNOSIS — N289 Disorder of kidney and ureter, unspecified: Secondary | ICD-10-CM

## 2013-01-23 DIAGNOSIS — J441 Chronic obstructive pulmonary disease with (acute) exacerbation: Secondary | ICD-10-CM | POA: Diagnosis present

## 2013-01-23 DIAGNOSIS — D696 Thrombocytopenia, unspecified: Secondary | ICD-10-CM | POA: Diagnosis not present

## 2013-01-23 DIAGNOSIS — I498 Other specified cardiac arrhythmias: Secondary | ICD-10-CM | POA: Diagnosis not present

## 2013-01-23 DIAGNOSIS — D469 Myelodysplastic syndrome, unspecified: Secondary | ICD-10-CM | POA: Diagnosis present

## 2013-01-23 DIAGNOSIS — N179 Acute kidney failure, unspecified: Secondary | ICD-10-CM

## 2013-01-23 DIAGNOSIS — IMO0002 Reserved for concepts with insufficient information to code with codable children: Secondary | ICD-10-CM

## 2013-01-23 DIAGNOSIS — I519 Heart disease, unspecified: Secondary | ICD-10-CM | POA: Diagnosis present

## 2013-01-23 DIAGNOSIS — Z515 Encounter for palliative care: Secondary | ICD-10-CM

## 2013-01-23 DIAGNOSIS — Z720 Tobacco use: Secondary | ICD-10-CM | POA: Diagnosis present

## 2013-01-23 DIAGNOSIS — F17200 Nicotine dependence, unspecified, uncomplicated: Secondary | ICD-10-CM | POA: Diagnosis present

## 2013-01-23 DIAGNOSIS — E875 Hyperkalemia: Secondary | ICD-10-CM | POA: Diagnosis not present

## 2013-01-23 DIAGNOSIS — Z23 Encounter for immunization: Secondary | ICD-10-CM

## 2013-01-23 DIAGNOSIS — R778 Other specified abnormalities of plasma proteins: Secondary | ICD-10-CM | POA: Diagnosis present

## 2013-01-23 DIAGNOSIS — R748 Abnormal levels of other serum enzymes: Secondary | ICD-10-CM | POA: Diagnosis present

## 2013-01-23 DIAGNOSIS — A419 Sepsis, unspecified organism: Principal | ICD-10-CM

## 2013-01-23 LAB — CBC
HCT: 24 % — ABNORMAL LOW (ref 39.0–52.0)
Hemoglobin: 8.8 g/dL — ABNORMAL LOW (ref 13.0–17.0)
MCH: 40.7 pg — ABNORMAL HIGH (ref 26.0–34.0)
MCHC: 36.7 g/dL — ABNORMAL HIGH (ref 30.0–36.0)
RBC: 2.16 MIL/uL — ABNORMAL LOW (ref 4.22–5.81)

## 2013-01-23 LAB — POCT I-STAT 3, ART BLOOD GAS (G3+)
Acid-base deficit: 6 mmol/L — ABNORMAL HIGH (ref 0.0–2.0)
Bicarbonate: 16.5 mEq/L — ABNORMAL LOW (ref 20.0–24.0)
O2 Saturation: 100 %
Patient temperature: 99
TCO2: 17 mmol/L (ref 0–100)
TCO2: 20 mmol/L (ref 0–100)
pCO2 arterial: 24.3 mmHg — ABNORMAL LOW (ref 35.0–45.0)
pH, Arterial: 7.438 (ref 7.350–7.450)

## 2013-01-23 LAB — URINE MICROSCOPIC-ADD ON

## 2013-01-23 LAB — COMPREHENSIVE METABOLIC PANEL
ALT: 6 U/L (ref 0–53)
Alkaline Phosphatase: 117 U/L (ref 39–117)
CO2: 17 mEq/L — ABNORMAL LOW (ref 19–32)
GFR calc Af Amer: 61 mL/min — ABNORMAL LOW (ref >90)
GFR calc non Af Amer: 53 mL/min — ABNORMAL LOW (ref >90)
Glucose, Bld: 145 mg/dL — ABNORMAL HIGH (ref 70–99)
Potassium: 5 mEq/L (ref 3.5–5.1)
Sodium: 130 mEq/L — ABNORMAL LOW (ref 135–145)

## 2013-01-23 LAB — CBC WITH DIFFERENTIAL/PLATELET
Basophils Absolute: 0 10*3/uL (ref 0.0–0.1)
Basophils Relative: 0 % (ref 0–1)
Basophils Relative: 0 % (ref 0–1)
Eosinophils Absolute: 0 10*3/uL (ref 0.0–0.7)
Eosinophils Absolute: 0 10*3/uL (ref 0.0–0.7)
HCT: 31.1 % — ABNORMAL LOW (ref 39.0–52.0)
Hemoglobin: 10.9 g/dL — ABNORMAL LOW (ref 13.0–17.0)
Hemoglobin: 8.2 g/dL — ABNORMAL LOW (ref 13.0–17.0)
Lymphocytes Relative: 1 % — ABNORMAL LOW (ref 12–46)
Lymphocytes Relative: 2 % — ABNORMAL LOW (ref 12–46)
MCH: 40 pg — ABNORMAL HIGH (ref 26.0–34.0)
MCHC: 35 g/dL (ref 30.0–36.0)
MCHC: 36.8 g/dL — ABNORMAL HIGH (ref 30.0–36.0)
MCV: 111.9 fL — ABNORMAL HIGH (ref 78.0–100.0)
Monocytes Absolute: 2.8 10*3/uL — ABNORMAL HIGH (ref 0.1–1.0)
Monocytes Relative: 8 % (ref 3–12)
Neutro Abs: 35.2 10*3/uL — ABNORMAL HIGH (ref 1.7–7.7)
Neutrophils Relative %: 91 % — ABNORMAL HIGH (ref 43–77)
Platelets: 250 10*3/uL (ref 150–400)
RDW: 15.8 % — ABNORMAL HIGH (ref 11.5–15.5)
Smear Review: ADEQUATE
WBC Morphology: INCREASED

## 2013-01-23 LAB — URINALYSIS, ROUTINE W REFLEX MICROSCOPIC
Glucose, UA: NEGATIVE mg/dL
Glucose, UA: NEGATIVE mg/dL
Hgb urine dipstick: NEGATIVE
Hgb urine dipstick: NEGATIVE
Ketones, ur: 15 mg/dL — AB
Ketones, ur: NEGATIVE mg/dL
Leukocytes, UA: NEGATIVE
Protein, ur: 100 mg/dL — AB
Protein, ur: 100 mg/dL — AB
pH: 5 (ref 5.0–8.0)
pH: 5 (ref 5.0–8.0)

## 2013-01-23 LAB — BLOOD GAS, ARTERIAL
Acid-base deficit: 6.8 mmol/L — ABNORMAL HIGH (ref 0.0–2.0)
Bicarbonate: 16.6 mEq/L — ABNORMAL LOW (ref 20.0–24.0)
Drawn by: 27733
FIO2: 0.5 %
O2 Saturation: 86.1 %
pCO2 arterial: 25.1 mmHg — ABNORMAL LOW (ref 35.0–45.0)
pO2, Arterial: 56.3 mmHg — ABNORMAL LOW (ref 80.0–100.0)

## 2013-01-23 LAB — TROPONIN I: Troponin I: 0.44 ng/mL (ref <0.30)

## 2013-01-23 LAB — BASIC METABOLIC PANEL
BUN: 45 mg/dL — ABNORMAL HIGH (ref 6–23)
CO2: 17 mEq/L — ABNORMAL LOW (ref 19–32)
Chloride: 94 mEq/L — ABNORMAL LOW (ref 96–112)
GFR calc non Af Amer: 65 mL/min — ABNORMAL LOW (ref >90)
Glucose, Bld: 134 mg/dL — ABNORMAL HIGH (ref 70–99)
Potassium: 4.3 mEq/L (ref 3.5–5.1)
Sodium: 128 mEq/L — ABNORMAL LOW (ref 135–145)

## 2013-01-23 LAB — MRSA PCR SCREENING: MRSA by PCR: NEGATIVE

## 2013-01-23 LAB — TYPE AND SCREEN: ABO/RH(D): O POS

## 2013-01-23 LAB — ABO/RH: ABO/RH(D): O POS

## 2013-01-23 LAB — CARBOXYHEMOGLOBIN: Methemoglobin: 1.6 % — ABNORMAL HIGH (ref 0.0–1.5)

## 2013-01-23 LAB — STREP PNEUMONIAE URINARY ANTIGEN: Strep Pneumo Urinary Antigen: NEGATIVE

## 2013-01-23 LAB — GLUCOSE, CAPILLARY: Glucose-Capillary: 132 mg/dL — ABNORMAL HIGH (ref 70–99)

## 2013-01-23 MED ORDER — FE FUM-VIT C-VIT B12-FA 460-60-0.01-1 MG PO CAPS: 1.0000 | ORAL_CAPSULE | Freq: Every day | ORAL | Status: DC

## 2013-01-23 MED ORDER — LORAZEPAM 2 MG/ML IJ SOLN: 1.0000 mg | Freq: Once | INTRAMUSCULAR | Status: DC

## 2013-01-23 MED ORDER — SODIUM CHLORIDE 0.9 % IV BOLUS (SEPSIS)
500.0000 mL | Freq: Once | INTRAVENOUS | Status: AC
  Administered 2013-01-23: 500 mL via INTRAVENOUS

## 2013-01-23 MED ORDER — LEVALBUTEROL HCL 0.63 MG/3ML IN NEBU
0.6300 mg | INHALATION_SOLUTION | Freq: Four times a day (QID) | RESPIRATORY_TRACT | Status: DC | PRN
  Filled 2013-01-23: qty 3

## 2013-01-23 MED ORDER — ONDANSETRON HCL 4 MG/2ML IJ SOLN: 4.0000 mg | Freq: Three times a day (TID) | INTRAMUSCULAR | Status: DC | PRN

## 2013-01-23 MED ORDER — ACETAMINOPHEN 325 MG PO TABS
650.0000 mg | ORAL_TABLET | Freq: Once | ORAL | Status: AC
  Administered 2013-01-23: 650 mg via ORAL
  Filled 2013-01-23: qty 2

## 2013-01-23 MED ORDER — DEXTROSE 5 % IV SOLN
1.0000 g | INTRAVENOUS | Status: DC
  Administered 2013-01-23: 1 g via INTRAVENOUS
  Filled 2013-01-23 (×3): qty 10

## 2013-01-23 MED ORDER — MIDAZOLAM HCL 2 MG/2ML IJ SOLN
INTRAMUSCULAR | Status: AC
  Filled 2013-01-23: qty 4

## 2013-01-23 MED ORDER — DEXTROSE 5 % IV SOLN
500.0000 mg | Freq: Once | INTRAVENOUS | Status: AC
  Administered 2013-01-23: 500 mg via INTRAVENOUS
  Filled 2013-01-23: qty 500

## 2013-01-23 MED ORDER — FLUTICASONE PROPIONATE 50 MCG/ACT NA SUSP
1.0000 | Freq: Every day | NASAL | Status: DC
  Administered 2013-01-24: 1 via NASAL
  Filled 2013-01-23: qty 16

## 2013-01-23 MED ORDER — ACETAMINOPHEN 650 MG RE SUPP: 650.0000 mg | Freq: Four times a day (QID) | RECTAL | Status: DC | PRN

## 2013-01-23 MED ORDER — ENSURE COMPLETE PO LIQD: 237.0000 mL | Freq: Three times a day (TID) | ORAL | Status: DC

## 2013-01-23 MED ORDER — DARBEPOETIN ALFA-POLYSORBATE 100 MCG/0.5ML IJ SOLN
100.0000 ug | INTRAMUSCULAR | Status: DC
  Administered 2013-01-23 – 2013-02-06 (×3): 100 ug via SUBCUTANEOUS
  Filled 2013-01-23 (×5): qty 0.5

## 2013-01-23 MED ORDER — ONDANSETRON HCL 4 MG PO TABS: 4.0000 mg | ORAL_TABLET | Freq: Four times a day (QID) | ORAL | Status: DC | PRN

## 2013-01-23 MED ORDER — HEPARIN BOLUS VIA INFUSION
3000.0000 [IU] | Freq: Once | INTRAVENOUS | Status: AC
  Administered 2013-01-23: 3000 [IU] via INTRAVENOUS
  Filled 2013-01-23: qty 3000

## 2013-01-23 MED ORDER — VITAL AF 1.2 CAL PO LIQD
1000.0000 mL | ORAL | Status: DC
  Administered 2013-01-23: 1000 mL
  Filled 2013-01-23 (×3): qty 1000

## 2013-01-23 MED ORDER — SODIUM CHLORIDE 0.9 % IV SOLN
200.0000 mg | Freq: Two times a day (BID) | INTRAVENOUS | Status: DC
  Administered 2013-01-23 – 2013-01-25 (×5): 200 mg via INTRAVENOUS
  Filled 2013-01-23 (×10): qty 4

## 2013-01-23 MED ORDER — FENTANYL CITRATE 0.05 MG/ML IJ SOLN
INTRAMUSCULAR | Status: AC
  Filled 2013-01-23: qty 2

## 2013-01-23 MED ORDER — FENTANYL CITRATE 0.05 MG/ML IJ SOLN
25.0000 ug/h | INTRAMUSCULAR | Status: DC
  Administered 2013-01-23 – 2013-01-24 (×2): 100 ug/h via INTRAVENOUS
  Administered 2013-01-24: 150 ug/h via INTRAVENOUS
  Administered 2013-01-25: 100 ug/h via INTRAVENOUS
  Administered 2013-01-27: 120 ug/h via INTRAVENOUS
  Filled 2013-01-23 (×5): qty 50

## 2013-01-23 MED ORDER — ACETAMINOPHEN 325 MG PO TABS
650.0000 mg | ORAL_TABLET | Freq: Four times a day (QID) | ORAL | Status: DC | PRN
  Administered 2013-01-24: 650 mg via ORAL
  Filled 2013-01-23: qty 2

## 2013-01-23 MED ORDER — FENTANYL BOLUS VIA INFUSION
25.0000 ug | Freq: Four times a day (QID) | INTRAVENOUS | Status: DC | PRN
  Administered 2013-01-24: 50 ug via INTRAVENOUS
  Filled 2013-01-23: qty 100

## 2013-01-23 MED ORDER — DIAZEPAM 5 MG PO TABS
5.0000 mg | ORAL_TABLET | Freq: Every day | ORAL | Status: DC
  Administered 2013-01-23: 5 mg via ORAL
  Filled 2013-01-23: qty 1

## 2013-01-23 MED ORDER — CYANOCOBALAMIN 500 MCG PO TABS
500.0000 ug | ORAL_TABLET | Freq: Every day | ORAL | Status: DC
  Administered 2013-01-23: 500 ug via ORAL
  Filled 2013-01-23: qty 1

## 2013-01-23 MED ORDER — MIDAZOLAM HCL 2 MG/2ML IJ SOLN
INTRAMUSCULAR | Status: AC
  Filled 2013-01-23: qty 2

## 2013-01-23 MED ORDER — ALBUTEROL SULFATE (5 MG/ML) 0.5% IN NEBU
2.5000 mg | INHALATION_SOLUTION | Freq: Once | RESPIRATORY_TRACT | Status: AC
  Administered 2013-01-23: 2.5 mg via RESPIRATORY_TRACT

## 2013-01-23 MED ORDER — SODIUM CHLORIDE 0.9 % IJ SOLN
3.0000 mL | Freq: Two times a day (BID) | INTRAMUSCULAR | Status: DC
  Administered 2013-01-24 – 2013-01-29 (×9): 3 mL via INTRAVENOUS

## 2013-01-23 MED ORDER — FE FUMARATE-B12-VIT C-FA-IFC PO CAPS
1.0000 | ORAL_CAPSULE | Freq: Every day | ORAL | Status: DC
  Administered 2013-01-23 – 2013-01-31 (×9): 1 via ORAL
  Filled 2013-01-23 (×10): qty 1

## 2013-01-23 MED ORDER — FENTANYL CITRATE 0.05 MG/ML IJ SOLN
100.0000 ug | Freq: Once | INTRAMUSCULAR | Status: AC
  Administered 2013-01-23: 100 ug via INTRAVENOUS

## 2013-01-23 MED ORDER — SODIUM CHLORIDE 0.9 % IV SOLN
1.0000 mg/h | INTRAVENOUS | Status: DC
  Administered 2013-01-23: 4 mg/h via INTRAVENOUS
  Administered 2013-01-23: 2 mg/h via INTRAVENOUS
  Administered 2013-01-24 (×2): 4 mg/h via INTRAVENOUS
  Administered 2013-01-25: 2 mg/h via INTRAVENOUS
  Filled 2013-01-23 (×5): qty 10

## 2013-01-23 MED ORDER — NOREPINEPHRINE BITARTRATE 1 MG/ML IJ SOLN
5.0000 ug/min | INTRAVENOUS | Status: DC
  Administered 2013-01-23: 10 ug/min via INTRAVENOUS
  Administered 2013-01-23: 5 ug/min via INTRAVENOUS
  Filled 2013-01-23 (×2): qty 4

## 2013-01-23 MED ORDER — MIDAZOLAM HCL 2 MG/2ML IJ SOLN: 2.0000 mg | Freq: Once | INTRAMUSCULAR | Status: DC

## 2013-01-23 MED ORDER — PHENYTOIN SODIUM EXTENDED 100 MG PO CAPS
200.0000 mg | ORAL_CAPSULE | Freq: Two times a day (BID) | ORAL | Status: DC
  Administered 2013-01-23: 200 mg via ORAL
  Filled 2013-01-23 (×2): qty 2

## 2013-01-23 MED ORDER — ASPIRIN 325 MG PO TABS
325.0000 mg | ORAL_TABLET | Freq: Every day | ORAL | Status: DC
  Administered 2013-01-23: 325 mg via ORAL
  Filled 2013-01-23: qty 1

## 2013-01-23 MED ORDER — LEVALBUTEROL HCL 0.63 MG/3ML IN NEBU
0.6300 mg | INHALATION_SOLUTION | Freq: Four times a day (QID) | RESPIRATORY_TRACT | Status: DC
  Administered 2013-01-23 – 2013-02-12 (×80): 0.63 mg via RESPIRATORY_TRACT
  Filled 2013-01-23 (×131): qty 3

## 2013-01-23 MED ORDER — DIAZEPAM 5 MG/ML IJ SOLN: 2.5000 mg | Freq: Three times a day (TID) | INTRAMUSCULAR | Status: DC | PRN

## 2013-01-23 MED ORDER — IPRATROPIUM BROMIDE 0.02 % IN SOLN
0.5000 mg | RESPIRATORY_TRACT | Status: DC
  Administered 2013-01-23 (×2): 0.5 mg via RESPIRATORY_TRACT
  Filled 2013-01-23 (×2): qty 2.5

## 2013-01-23 MED ORDER — MIDAZOLAM BOLUS VIA INFUSION
1.0000 mg | INTRAVENOUS | Status: DC | PRN
  Administered 2013-01-24: 2 mg via INTRAVENOUS
  Filled 2013-01-23: qty 2

## 2013-01-23 MED ORDER — FOLIC ACID 1 MG PO TABS
1.0000 mg | ORAL_TABLET | Freq: Every day | ORAL | Status: DC
  Administered 2013-01-23 – 2013-02-12 (×21): 1 mg
  Filled 2013-01-23 (×3): qty 1
  Filled 2013-01-23: qty 2
  Filled 2013-01-23 (×2): qty 1
  Filled 2013-01-23 (×2): qty 2
  Filled 2013-01-23 (×10): qty 1
  Filled 2013-01-23 (×2): qty 2
  Filled 2013-01-23 (×3): qty 1

## 2013-01-23 MED ORDER — SODIUM CHLORIDE 0.9 % IV BOLUS (SEPSIS): 1000.0000 mL | Freq: Once | INTRAVENOUS | Status: DC

## 2013-01-23 MED ORDER — IPRATROPIUM BROMIDE 0.02 % IN SOLN
RESPIRATORY_TRACT | Status: AC
  Filled 2013-01-23: qty 2.5

## 2013-01-23 MED ORDER — FENTANYL CITRATE 0.05 MG/ML IJ SOLN
INTRAMUSCULAR | Status: AC
  Filled 2013-01-23: qty 4

## 2013-01-23 MED ORDER — MIDAZOLAM HCL 2 MG/2ML IJ SOLN
2.0000 mg | Freq: Once | INTRAMUSCULAR | Status: AC
  Administered 2013-01-23: 2 mg via INTRAVENOUS

## 2013-01-23 MED ORDER — FOLIC ACID 0.5 MG HALF TAB
400.0000 ug | ORAL_TABLET | Freq: Every day | ORAL | Status: DC
Start: 2013-01-23 — End: 2013-01-23
  Administered 2013-01-23: 0.5 mg via ORAL
  Filled 2013-01-23: qty 1

## 2013-01-23 MED ORDER — ALBUTEROL SULFATE (5 MG/ML) 0.5% IN NEBU
INHALATION_SOLUTION | RESPIRATORY_TRACT | Status: AC
  Filled 2013-01-23: qty 0.5

## 2013-01-23 MED ORDER — PANTOPRAZOLE SODIUM 40 MG IV SOLR
40.0000 mg | INTRAVENOUS | Status: DC
  Administered 2013-01-23 – 2013-01-25 (×3): 40 mg via INTRAVENOUS
  Filled 2013-01-23 (×5): qty 40

## 2013-01-23 MED ORDER — SODIUM CHLORIDE 0.9 % IV BOLUS (SEPSIS): 1000.0000 mL | INTRAVENOUS | Status: DC | PRN

## 2013-01-23 MED ORDER — CHLORHEXIDINE GLUCONATE 0.12 % MT SOLN
OROMUCOSAL | Status: AC
  Administered 2013-01-23: 15 mL
  Filled 2013-01-23: qty 15

## 2013-01-23 MED ORDER — IPRATROPIUM BROMIDE 0.02 % IN SOLN
0.5000 mg | Freq: Four times a day (QID) | RESPIRATORY_TRACT | Status: DC
  Administered 2013-01-23 – 2013-02-12 (×79): 0.5 mg via RESPIRATORY_TRACT
  Filled 2013-01-23 (×80): qty 2.5

## 2013-01-23 MED ORDER — ASPIRIN 81 MG PO CHEW
324.0000 mg | CHEWABLE_TABLET | Freq: Once | ORAL | Status: AC
  Administered 2013-01-23: 324 mg via ORAL
  Filled 2013-01-23: qty 4

## 2013-01-23 MED ORDER — SODIUM CHLORIDE 0.9 % IV SOLN
INTRAVENOUS | Status: AC
  Administered 2013-01-23: 06:00:00 via INTRAVENOUS

## 2013-01-23 MED ORDER — IPRATROPIUM BROMIDE 0.02 % IN SOLN
0.5000 mg | Freq: Once | RESPIRATORY_TRACT | Status: AC
  Administered 2013-01-23: 0.5 mg via RESPIRATORY_TRACT

## 2013-01-23 MED ORDER — ALBUTEROL SULFATE (5 MG/ML) 0.5% IN NEBU
2.5000 mg | INHALATION_SOLUTION | Freq: Once | RESPIRATORY_TRACT | Status: AC
  Administered 2013-01-23: 2.5 mg via RESPIRATORY_TRACT
  Filled 2013-01-23 (×2): qty 0.5

## 2013-01-23 MED ORDER — DEXTROSE 5 % IV SOLN
500.0000 mg | INTRAVENOUS | Status: DC
  Administered 2013-01-24 – 2013-01-26 (×3): 500 mg via INTRAVENOUS
  Filled 2013-01-23 (×3): qty 500

## 2013-01-23 MED ORDER — METHYLPREDNISOLONE SODIUM SUCC 125 MG IJ SOLR
125.0000 mg | Freq: Once | INTRAMUSCULAR | Status: AC
  Administered 2013-01-23: 125 mg via INTRAVENOUS
  Filled 2013-01-23: qty 2

## 2013-01-23 MED ORDER — DEXTROSE 5 % IV SOLN
1.0000 g | Freq: Once | INTRAVENOUS | Status: AC
  Administered 2013-01-23: 1 g via INTRAVENOUS
  Filled 2013-01-23: qty 10

## 2013-01-23 MED ORDER — SODIUM CHLORIDE 0.9 % IV BOLUS (SEPSIS)
1000.0000 mL | Freq: Once | INTRAVENOUS | Status: AC
  Administered 2013-01-23: 1000 mL via INTRAVENOUS

## 2013-01-23 MED ORDER — BUDESONIDE 0.25 MG/2ML IN SUSP
0.2500 mg | Freq: Two times a day (BID) | RESPIRATORY_TRACT | Status: DC
  Administered 2013-01-23 – 2013-01-27 (×9): 0.25 mg via RESPIRATORY_TRACT
  Filled 2013-01-23 (×11): qty 2

## 2013-01-23 MED ORDER — FENTANYL CITRATE 0.05 MG/ML IJ SOLN: 100.0000 ug | Freq: Once | INTRAMUSCULAR | Status: DC

## 2013-01-23 MED ORDER — IPRATROPIUM BROMIDE 0.02 % IN SOLN
0.5000 mg | Freq: Once | RESPIRATORY_TRACT | Status: AC
  Administered 2013-01-23: 0.5 mg via RESPIRATORY_TRACT
  Filled 2013-01-23 (×2): qty 2.5

## 2013-01-23 MED ORDER — PANCRELIPASE (LIP-PROT-AMYL) 12000-38000 UNITS PO CPEP
2.0000 | ORAL_CAPSULE | Freq: Once | ORAL | Status: AC
  Administered 2013-01-23: 2 via ORAL
  Filled 2013-01-23: qty 2

## 2013-01-23 MED ORDER — ENOXAPARIN SODIUM 40 MG/0.4ML ~~LOC~~ SOLN
40.0000 mg | SUBCUTANEOUS | Status: DC
  Administered 2013-01-23 – 2013-01-29 (×7): 40 mg via SUBCUTANEOUS
  Filled 2013-01-23 (×8): qty 0.4

## 2013-01-23 MED ORDER — HEPARIN (PORCINE) IN NACL 100-0.45 UNIT/ML-% IJ SOLN
650.0000 [IU]/h | INTRAMUSCULAR | Status: DC
  Administered 2013-01-23: 650 [IU]/h via INTRAVENOUS
  Filled 2013-01-23: qty 250

## 2013-01-23 MED ORDER — SODIUM BICARBONATE 650 MG PO TABS
650.0000 mg | ORAL_TABLET | Freq: Once | ORAL | Status: AC
  Administered 2013-01-23: 650 mg via ORAL
  Filled 2013-01-23: qty 1

## 2013-01-23 MED ORDER — ONDANSETRON HCL 4 MG/2ML IJ SOLN: 4.0000 mg | Freq: Four times a day (QID) | INTRAMUSCULAR | Status: DC | PRN

## 2013-01-23 NOTE — Consult Note (Signed)
PULMONARY  / CRITICAL CARE MEDICINE  Name: Adrian [name] MRN: [id number] DOB: 04/18/1951    ADMISSION DATE:  01/23/2013 CONSULTATION DATE:  [id number]  REFERRING MD :  Serita Kyle, MD PRIMARY SERVICE: [facility]--> PCCM  CHIEF COMPLAINT:  SOB, cough  BRIEF PATIENT DESCRIPTION:  62yo male with hx ETOH, COPD, anemia, myelodysplastic syndrome admitted 9/17 by [facility] with large PNA.  Decompensated requiring intubation, tx ICU.   SIGNIFICANT EVENTS / STUDIES:  EET/Vent 9/17>>> OGT 9/17  >>>  LINES / TUBES: 9/17 L IJ CVL>>> 9/17 R rad aline>>>>  CULTURES: MRSA PCR 9/17>>> Sputum 9/17>>> Blood culture 9/17>>> Urine strep 9/17>>> NEG   ANTIBIOTICS: Azithromycin 9/17 >>> Ceftriaxone 9/17 >>>  HISTORY OF PRESENT ILLNESS:   62 y/o male with PMH COPD, seizures on dilantin, alcohol abuse, Myelodysplastic Syndrome, and anemia presents to [facility] on 01/23/13 with a c/o 1 weeks hx progressive nonproductive cough and SOB. In ER with RR 55 and O2 sat 86 on RA.  Tried taking Symbicort at home, without relief. He denied any fever,chills,sweats, chest pain, heaviness, tightness, pressure.  A RUL pneumonia was seen on 9/17chest xray. Labs revealed elevated Troponin and D-Dimer. Pt had min response to Albuterol and Ipratropium tx x 2. Admitted to [facility] and rx with abx, bipap.  Cont to decompensate, requiring intubation.  Hx obtained from medical records and discussions with [facility].   PAST MEDICAL HISTORY :  Past Medical History  Diagnosis Date  . Seizures   . Megaloblastic anemia   . Emphysema of lung    Past Surgical History  Procedure Laterality Date  . Abdominal hernia repair     Prior to Admission medications   Medication Sig Start Date End Date Taking? Authorizing Provider  budesonide-formoterol (SYMBICORT) 160-4.5 MCG/ACT inhaler Inhale 2 puffs into the lungs 2 (two) times daily.   Yes Historical Provider, MD  cyanocobalamin 500 MCG tablet Take 500 mcg by mouth daily.   Yes Historical  Provider, MD  diazepam (VALIUM) 5 MG tablet Take 5 mg by mouth daily.   Yes Historical Provider, MD  Fe Fum-Vit C-Vit B12-FA (HEMATOGEN FORTE PO) Take 1 tablet by mouth daily.   Yes Historical Provider, MD  folic acid (FOLVITE) 400 MCG tablet Take 400 mcg by mouth daily.   Yes Historical Provider, MD  mometasone (NASONEX) 50 MCG/ACT nasal spray Place 2 sprays into the nose daily.   Yes Historical Provider, MD  phenytoin (DILANTIN) 100 MG ER capsule Take 200 mg by mouth 2 (two) times daily.   Yes Historical Provider, MD  predniSONE (DELTASONE) 20 MG tablet Take 20-60 mg by mouth daily. Take 60 mg for 4 days, take 40 mg for 4 days take 20 mg for 4 days   Yes Historical Provider, MD   No Known Allergies  FAMILY HISTORY:  Family History  Problem Relation Age of Onset  . Hypertension Mother    SOCIAL HISTORY:  reports that he has been smoking Cigarettes.  He has a 3.75 pack-year smoking history. He has never used smokeless tobacco. He reports that  drinks alcohol. He reports that he does not use illicit drugs.  REVIEW OF SYSTEMS:  Unable to obtain due to pt's present status  VITAL SIGNS: Temp:  [97.7 F (36.5 C)-102 F (38.9 C)] 98.5 F (36.9 C) (09/17 1100) Pulse Rate:  [66-136] 126 (09/17 1400) Resp:  [26-55] 37 (09/17 1400) BP: (99-148)/(57-85) 113/72 mmHg (09/17 1400) SpO2:  [85 %-100 %] 96 % (09/17 1400) FiO2 (%):  [40 %-90 %] 50 % (  09/17 1242) Weight:  [53 kg (116 lb 13.5 oz)] 53 kg (116 lb 13.5 oz) (09/17 0430) HEMODYNAMICS:   VENTILATOR SETTINGS: Vent Mode:  [-]  FiO2 (%):  [40 %-90 %] 50 % INTAKE / OUTPUT: Intake/Output     09/16 0701 - 09/17 0700 09/17 0701 - 09/18 0700   I.V. (mL/kg) 250 (4.7)    Total Intake(mL/kg) 250 (4.7)    Urine (mL/kg/hr)  100 (0.2)   Total Output   100   Net +250 -100          PHYSICAL EXAMINATION: General:  Cachectic, chronically ill appearing male, on BIPAP with accessory muscle use Neuro:  A & O x 3, MAE  HEENT:  Atraumatic, w/o  icterus Cardiovascular:   Lungs:  Insp/exp rhonci RUL, wheezes heard throughout Abdomen:  +BS Musculoskeletal:  Warm and dry, no edema   LABS:  CBC Recent Labs     01/23/13  0058  01/23/13  0900  WBC  38.7*  38.8*  HGB  10.9*  8.8*  HCT  31.1*  24.0*  PLT  302  238   Coag's No results found for this basename: APTT, INR,  in the last 72 hours BMET Recent Labs     01/23/13  0058  01/23/13  0900  NA  130*  128*  K  5.0  4.3  CL  92*  94*  CO2  17*  17*  BUN  51*  45*  CREATININE  1.38*  1.15  GLUCOSE  145*  134*   Electrolytes Recent Labs     01/23/13  0058  01/23/13  0900  CALCIUM  9.6  8.6   Sepsis Markers No results found for this basename: LACTICACIDVEN, PROCALCITON, O2SATVEN,  in the last 72 hours ABG Recent Labs     01/23/13  0310  01/23/13  1248  PHART  7.438  7.437  PCO2ART  24.3*  25.1*  PO2ART  57.0*  56.3*   Liver Enzymes Recent Labs     01/23/13  0058  AST  17  ALT  6  ALKPHOS  117  BILITOT  0.5  ALBUMIN  2.9*   Cardiac Enzymes Recent Labs     01/23/13  0059  01/23/13  0800  TROPONINI  0.44*  0.54*  PROBNP  2575.0*   --    Glucose No results found for this basename: GLUCAP,  in the last 72 hours  Imaging Dg Chest 2 View  01/23/2013   CLINICAL DATA:  Shortness of breath  EXAM: CHEST  2 VIEW  COMPARISON:  11/07/2011  FINDINGS: Dense right upper lobe consolidation. No definite cavitation or effusion. More prominent interstitial markings diffusely. No cardiomegaly. Limited evaluation of the right upper mediastinum due to neighboring consolidation, limiting detection of adenopathy.  Remote bilateral humeral neck fractures. Remote thoracic compression fractures.  IMPRESSION: 1. Dense right upper lobe pneumonia. Recommend followup to radiographic clearing. 2. Emphysema with possible mild superimposed edema.   Electronically Signed   By: Tiburcio Pea   On: 01/23/2013 01:16     ASSESSMENT / PLAN:  PULMONARY Acute respiratory failure  - failed bipap  CAP  COPD P:   - Intubate  - Mech vent PRVC 18/500/10/100% - f/u ABG - f/u CXR in am  - abx as above  - BD - Hold on systemic steroids for now   CARDIOVASCULAR Elevated troponin  Mildly elevated BNP sepsis P:  - f/u cardiac panel  - f/u EKG  - caution with volume  -  f/u BNP  - f/u lactate  - asa  - hold heparin gtt   RENAL Hyponatremia  Renal insufficiency - mild P:   - f/u chem  - gentle volume   GASTROINTESTINAL ETOH abuse  Protein calorie malnutrition  P:   - Tube feeds  - PPI   HEMATOLOGIC Anemia - chronic - aranesp as outpt  Leukocytosis  Myelodysplastic syndrome  P:  - aranesp per pharmacy  - Vit B12   INFECTIOUS CAP  P:   - Abx as above  - pan culture   ENDOCRINE Hyperglycemia - mild  P:   - Monitor glucose on chem, add SSI if elevated   NEUROLOGIC Hx seizure - ?r/t ETOH withdrawal v other seizure disorder  P:   - Sedation while on vent  - F/u phenytoin level  - Cont dilantin  WHITEHEART,KATHRYN, NP 01/23/2013  4:10 PM Pager: [phone] or [phone]  Called daughter, informed her of patient's deterioration and needing of life support.  Will intubate, full vent support, place TLC and a-line.  Abx as ordered.  I have personally obtained a history, examined the patient, evaluated laboratory and imaging results, formulated the assessment and plan and placed orders.  CRITICAL CARE: The patient is critically ill with multiple organ systems failure and requires high complexity decision making for assessment and support, frequent evaluation and titration of therapies, application of advanced monitoring technologies and extensive interpretation of multiple databases. Critical Care Time devoted to patient care services described in this note is 90 minutes.   *Care during the described time interval was provided by me and/or other providers on the critical care team. I have reviewed this patient's available data,  including medical history, events of note, physical examination and test results as part of my evaluation.  Alyson Reedy, M.D. [facility] Pulmonary/Critical Care Medicine. Pager: [phone]. After hours pager: [phone].

## 2013-01-23 NOTE — ED Notes (Signed)
Pt. reports progressing SOB with dry cough for 1 week , pt. stated history of emphysema and chronic /heavy smoker.

## 2013-01-23 NOTE — Procedures (Signed)
Arterial Catheter Insertion Procedure Note Adrian [name] [id number] 1950-06-25  Procedure: Insertion of Arterial Catheter  Indications: Blood pressure monitoring  Procedure Details Consent: Unable to obtain consent because of altered level of consciousness. Time Out: Verified patient identification, verified procedure, site/side was marked, verified correct patient position, special equipment/implants available, medications/allergies/relevent history reviewed, required imaging and test results available.  Performed  Maximum sterile technique was used including antiseptics, cap, gloves, gown, hand hygiene, mask and sheet. Skin prep: Chlorhexidine; local anesthetic administered 20 gauge catheter was inserted into right radial artery using the Seldinger technique.  Evaluation Blood flow good; BP tracing good. Complications: No apparent complications.   Koren Bound 01/23/2013

## 2013-01-23 NOTE — Progress Notes (Signed)
CBG 132. 

## 2013-01-23 NOTE — H&P (Addendum)
[facility] History and Physical  Adrian [name] ZOX:[id number] DOB: November 29, 1950 DOA: 01/23/2013  Referring physician: ER physician. PCP: [name], MD  Chief Complaint: Shortness of breath.  HPI: Adrian [name] is a 62 y.o. male presents to the year because of progressive worsening of shortness of breath over the last one week. Patient has been having productive cough chills and fever. In the ER patient was found to have leukocytosis with fever and chest x-ray showing pneumonic process. In addition patient also has mildly elevated troponin. Patient is also hypoxic. Patient has been admitted for further management. ER physician as condition discussed with critical care. Patient denies any chest pain nausea vomiting abdominal pain diarrhea.  Review of Systems: As presented in the history of presenting illness, rest negative.  Past Medical History  Diagnosis Date  . Seizures   . Megaloblastic anemia   . Emphysema of lung    Past Surgical History  Procedure Laterality Date  . Abdominal hernia repair     Social History:  reports that he has been smoking Cigarettes.  He has a 3.75 pack-year smoking history. He has never used smokeless tobacco. He reports that  drinks alcohol. He reports that he does not use illicit drugs. Home. where does patient live-- Can do ADLs. Can patient participate in ADLs?  No Known Allergies  Family History  Problem Relation Age of Onset  . Hypertension Mother       Prior to Admission medications   Medication Sig Start Date End Date Taking? Authorizing Provider  budesonide-formoterol (SYMBICORT) 160-4.5 MCG/ACT inhaler Inhale 2 puffs into the lungs 2 (two) times daily.   Yes Historical Provider, MD  cyanocobalamin 500 MCG tablet Take 500 mcg by mouth daily.   Yes Historical Provider, MD  diazepam (VALIUM) 5 MG tablet Take 5 mg by mouth daily.   Yes Historical Provider, MD  Fe Fum-Vit C-Vit B12-FA (HEMATOGEN FORTE PO) Take 1 tablet by mouth  daily.   Yes Historical Provider, MD  folic acid (FOLVITE) 400 MCG tablet Take 400 mcg by mouth daily.   Yes Historical Provider, MD  mometasone (NASONEX) 50 MCG/ACT nasal spray Place 2 sprays into the nose daily.   Yes Historical Provider, MD  phenytoin (DILANTIN) 100 MG ER capsule Take 200 mg by mouth 2 (two) times daily.   Yes Historical Provider, MD  predniSONE (DELTASONE) 20 MG tablet Take 20-60 mg by mouth daily. Take 60 mg for 4 days, take 40 mg for 4 days take 20 mg for 4 days   Yes Historical Provider, MD   Physical Exam: Filed Vitals:   01/23/13 0315 01/23/13 0322 01/23/13 0330 01/23/13 0430  BP:  120/70 105/65   Pulse:  125 122   Temp: 102 F (38.9 C)   98.6 F (37 C)  TempSrc: Oral   Oral  Resp:  40 43   Height:    6' (1.829 m)  Weight:    53 kg (116 lb 13.5 oz)  SpO2:  91% 93%      General:  Well-developed and moderately nourished.  Eyes: Anicteric no pallor.  ENT: No discharge from ears eyes nose mouth.  Neck: No mass felt.  Cardiovascular: S1-S2 heard.  Respiratory: No rhonchi or crepitations.  Abdomen: Soft nontender bowel sounds present.  Skin: No rash.  Musculoskeletal: No edema.  Psychiatric: Appears normal.  Neurologic: Alert oriented to time place and person. Moves all extremities.  Labs on Admission:  Basic Metabolic Panel:  Recent Labs Lab 01/23/13 0058  NA 130*  K 5.0  CL 92*  CO2 17*  GLUCOSE 145*  BUN 51*  CREATININE 1.38*  CALCIUM 9.6   Liver Function Tests:  Recent Labs Lab 01/23/13 0058  AST 17  ALT 6  ALKPHOS 117  BILITOT 0.5  PROT 8.7*  ALBUMIN 2.9*   No results found for this basename: LIPASE, AMYLASE,  in the last 168 hours No results found for this basename: AMMONIA,  in the last 168 hours CBC:  Recent Labs Lab 01/23/13 0058  WBC 38.7*  NEUTROABS 35.2*  HGB 10.9*  HCT 31.1*  MCV 111.9*  PLT 302   Cardiac Enzymes:  Recent Labs Lab 01/23/13 0059  TROPONINI 0.44*    BNP (last 3  results)  Recent Labs  01/23/13 0059  PROBNP 2575.0*   CBG: No results found for this basename: GLUCAP,  in the last 168 hours  Radiological Exams on Admission: Dg Chest 2 View  01/23/2013   CLINICAL DATA:  Shortness of breath  EXAM: CHEST  2 VIEW  COMPARISON:  11/07/2011  FINDINGS: Dense right upper lobe consolidation. No definite cavitation or effusion. More prominent interstitial markings diffusely. No cardiomegaly. Limited evaluation of the right upper mediastinum due to neighboring consolidation, limiting detection of adenopathy.  Remote bilateral humeral neck fractures. Remote thoracic compression fractures.  IMPRESSION: 1. Dense right upper lobe pneumonia. Recommend followup to radiographic clearing. 2. Emphysema with possible mild superimposed edema.   Electronically Signed   By: Tiburcio Pea   On: 01/23/2013 01:16    EKG: Independently reviewed. Sinus tachycardia.  Assessment/Plan Principal Problem:   Sepsis Active Problems:   MDS (myelodysplastic syndrome), low grade   Non-ST elevation MI (NSTEMI)   Renal insufficiency   COPD (chronic obstructive pulmonary disease)   1. Sepsis secondary to pneumonia - continue with empiric antibiotics. Follow blood cultures urine Legionella and strep antigen. Continue with IV fluids. 2. Non-ST elevation MI - probably secondary to demand ischemia. Have placed patient on heparin and aspirin. Cycle cardiac markers check 2-D echo. 3. Acute renal failure - probably secondary to sepsis. Check FENa and urinalysis. Closely follow intake output. 4. Mild hyponatremia - check urine sodium. 5. Myelodysplastic syndrome - closely follow CBC with differential. 6. History of COPD - patient has been placed on Pulmicort and nebulizers. 7. History of Seizures - Continue Dilantin. Check Dilantin levels.  Ordering a repeat EKG. To be followed.  Code Status: Full code.  Family Communication: Patient's wife at the bedside.  Disposition Plan: Admit to  inpatient.    Trygg Mantz N. [facility] Pager [phone].   If 7PM-7AM, please contact night-coverage [email or web address] Password [email or web address] 01/23/2013, 5:37 AM

## 2013-01-23 NOTE — Progress Notes (Signed)
[facility] Progress Note [name] TEAM 1 - Stepdown ICU Team   Adrian [name] GEX:[id number] DOB: [birth year] DOA: 01/23/2013 PCP: [name], MD  Brief narrative: 62 year old male patient who presented to the emergency department because of increasing shortness of breath over one week. This was associated with productive cough, chills and fever. In the ER the patient's son had significant leukocytosis greater than 38,000 as well as fever. Chest x-ray demonstrated dense right upper lobe pneumonia. Patient also had mildly elevated troponin and d-dimer. Patient was hypoxic and was requiring oxygen he was also tachypneic and tachycardic.  Assessment/Plan:    Sepsis -due to pneumonia    Acute respiratory failure with hypoxia due to Right upper lobe pneumonia -community acquired  -requiring VM oxygen- desats quickly with associated tachycardia with attempts at [place] oxygen -cont empiric anbx's-consider changing to Levaquin to cover better atypical -high risk for intubation so follow closely  UPDATE: 12:48 pm- progressive respiratory failure as evidenced by tachypnea despite BiPAP x 30 minutes and tachycardia- PCCM evaluated pt - tx to ICU for intubation - ABG PO2 50's    Acute renal failure -due to infection and dehydration -follow lytes    COPD (chronic obstructive pulmonary disease) -not actively wheezing- nailbeds clubbed -was on Prednisone taper pre admit- unclear when started- consider cortisol level if develops hypotension not responsive to IVF's    Dehydration with hyponatremia -cont IVF    MDS (myelodysplastic syndrome), low grade -baseline Hgb 8.8- was hemoconcentrated at admit but back to baseline after hydration    Elevated troponin/Elevated d-dimer -EKG without ischemic changes -suspect both lab abnormalities due to sepsis and low perfusion    Seizure disorder -on Dilantin pre admit -dilantin level mildly sub therapeutic at admit    Tobacco  abuse -counseled against use   DVT prophylaxis: Lovenox Code Status: Full Family Communication: Patient Disposition Plan/Expected LOS: Remain in step down  Consultants: PCCM  Procedures: 2-D echocardiogram - pending  Antibiotics: Azithromycin 9/16 >>> Rocephin 9/16 >>>  HPI/Subjective: Patient alert and states feels much better than he felt prior to admission. Continues with cough and some shortness of breath. No chest pain.  Objective: Blood pressure 105/57, pulse 104, temperature 98 F (36.7 C), temperature source Oral, resp. rate 28, height 6' (1.829 m), weight 53 kg (116 lb 13.5 oz), SpO2 96.00%.  Intake/Output Summary (Last 24 hours) at 01/23/13 1112 Last data filed at 01/23/13 0347  Gross per 24 hour  Intake    250 ml  Output      0 ml  Net    250 ml    Exam: Followup exam completed noting patient admitted off 3:07 AM today  Scheduled Meds:  Scheduled Meds: . aspirin  325 mg Oral Daily  . [START ON 01/24/2013] azithromycin  500 mg Intravenous Q24H  . budesonide (PULMICORT) nebulizer solution  0.25 mg Nebulization BID  . cefTRIAXone (ROCEPHIN)  IV  1 g Intravenous Q24H  . cyanocobalamin  500 mcg Oral Daily  . diazepam  5 mg Oral Daily  . fluticasone  1 spray Each Nare Daily  . folic acid  400 mcg Oral Daily  . ipratropium  0.5 mg Nebulization Q4H  . levalbuterol  0.63 mg Nebulization Q6H  . phenytoin  200 mg Oral BID  . sodium chloride  3 mL Intravenous Q12H   Data Reviewed: Basic Metabolic Panel:  Recent Labs Lab 01/23/13 0058 01/23/13 0900  NA 130* 128*  K 5.0 4.3  CL 92* 94*  CO2 17* 17*  GLUCOSE 145* 134*  BUN 51* 45*  CREATININE 1.38* 1.15  CALCIUM 9.6 8.6   Liver Function Tests:  Recent Labs Lab 01/23/13 0058  AST 17  ALT 6  ALKPHOS 117  BILITOT 0.5  PROT 8.7*  ALBUMIN 2.9*   CBC:  Recent Labs Lab 01/23/13 0058 01/23/13 0900  WBC 38.7* 38.8*  NEUTROABS 35.2*  --   HGB 10.9* 8.8*  HCT 31.1* 24.0*  MCV 111.9* 111.1*   PLT 302 238   Cardiac Enzymes:  Recent Labs Lab 01/23/13 0059 01/23/13 0800  TROPONINI 0.44* 0.54*   BNP (last 3 results)  Recent Labs  01/23/13 0059  PROBNP 2575.0*    Recent Results (from the past 240 hour(s))  MRSA PCR SCREENING     Status: None   Collection Time    01/23/13  4:37 AM      Result Value Range Status   MRSA by PCR NEGATIVE  NEGATIVE Final   Comment:            The GeneXpert MRSA Assay (FDA     approved for NASAL specimens     only), is one component of a     comprehensive MRSA colonization     surveillance program. It is not     intended to diagnose MRSA     infection nor to guide or     monitor treatment for     MRSA infections.     Studies:  Recent x-ray studies have been reviewed in detail by the Attending Physician   Junious Silk, ANP [facility] Office  [phone] Pager [phone]  **If unable to reach the above provider after paging please contact the Flow Manager @ [phone]  On-Call/Text Page:      [email or web address]      password [email or web address]  If 7PM-7AM, please contact night-coverage [email or web address] Password [email or web address] 01/23/2013, 11:12 AM   LOS: 0 days   I have personally examined this patient and reviewed the entire database. I have reviewed the above note, made any necessary editorial changes, and agree with its content.  Lonia Blood, MD [facility]

## 2013-01-23 NOTE — Progress Notes (Signed)
eLink Physician-Brief Progress Note Patient Name: JUDSON TSAN DOB: Nov 12, 1950 MRN: [id number]  Date of Service  01/23/2013   HPI/Events of Note   No SUP ordered   eICU Interventions  Ordered IV protonix   Intervention Category Intermediate Interventions: Best-practice therapies (e.g. DVT, beta blocker, etc.)  Shan Levans 01/23/2013, 8:13 PM

## 2013-01-23 NOTE — Progress Notes (Signed)
eLink Physician-Brief Progress Note Patient Name: Adrian [name] DOB: [birth year] MRN: [id number]  Date of Service  01/23/2013   HPI/Events of Note   Pt in septic shock  eICU Interventions  See septic shock order set   Intervention Category Major Interventions: Sepsis - evaluation and management;Shock - evaluation and management  Shan Levans 01/23/2013, 5:04 PM

## 2013-01-23 NOTE — Progress Notes (Signed)
MEDICATION RELATED CONSULT NOTE - INITIAL   Pharmacy Consult for Aranesp Indication: anemia  - on prior to admission (300 mcq every 3 weeks)  No Known Allergies  Patient Measurements: Height: 6' (182.9 cm) Weight: 116 lb 13.5 oz (53 kg) IBW/kg (Calculated) : 77.6   Vital Signs: Temp: 99 F (37.2 C) (09/17 1457) Temp src: Oral (09/17 1457) BP: 127/71 mmHg (09/17 1619) Pulse Rate: 126 (09/17 1619) Intake/Output from previous day: 09/16 0701 - 09/17 0700 In: 250 [I.V.:250] Out: -  Intake/Output from this shift: Total I/O In: -  Out: 100 [Urine:100]  Labs:  Recent Labs  01/23/13 0058 01/23/13 0900 01/23/13 1258  WBC 38.7* 38.8*  --   HGB 10.9* 8.8*  --   HCT 31.1* 24.0*  --   PLT 302 238  --   CREATININE 1.38* 1.15  --   LABCREA  --   --  145.66  ALBUMIN 2.9*  --   --   PROT 8.7*  --   --   AST 17  --   --   ALT 6  --   --   ALKPHOS 117  --   --   BILITOT 0.5  --   --    Estimated Creatinine Clearance: 48.6 ml/min (by C-G formula based on Cr of 1.15).   Microbiology: Recent Results (from the past 720 hour(s))  MRSA PCR SCREENING     Status: None   Collection Time    01/23/13  4:37 AM      Result Value Range Status   MRSA by PCR NEGATIVE  NEGATIVE Final   Comment:            The GeneXpert MRSA Assay (FDA     approved for NASAL specimens     only), is one component of a     comprehensive MRSA colonization     surveillance program. It is not     intended to diagnose MRSA     infection nor to guide or     monitor treatment for     MRSA infections.    Medical History: Past Medical History  Diagnosis Date  . Seizures   . Megaloblastic anemia   . Emphysema of lung     Medications:  Prescriptions prior to admission  Medication Sig Dispense Refill  . budesonide-formoterol (SYMBICORT) 160-4.5 MCG/ACT inhaler Inhale 2 puffs into the lungs 2 (two) times daily.      . cyanocobalamin 500 MCG tablet Take 500 mcg by mouth daily.      . diazepam (VALIUM)  5 MG tablet Take 5 mg by mouth daily.      . Fe Fum-Vit C-Vit B12-FA (HEMATOGEN FORTE PO) Take 1 tablet by mouth daily.      . folic acid (FOLVITE) 400 MCG tablet Take 400 mcg by mouth daily.      . mometasone (NASONEX) 50 MCG/ACT nasal spray Place 2 sprays into the nose daily.      . phenytoin (DILANTIN) 100 MG ER capsule Take 200 mg by mouth 2 (two) times daily.      . predniSONE (DELTASONE) 20 MG tablet Take 20-60 mg by mouth daily. Take 60 mg for 4 days, take 40 mg for 4 days take 20 mg for 4 days        Assessment: 62 year old man with myelodysplastic syndrome to continue Aranesp for anemia.    Plan:  Aranesp 100 mcg SQ weekly x 4.   Will order anemia panel for the  morning to assess iron stores.  Mickeal Skinner 01/23/2013,4:50 PM

## 2013-01-23 NOTE — ED Provider Notes (Signed)
CSN: [id number]     Arrival date & time 01/23/13  0004 History   First MD Initiated Contact with Patient 01/23/13 0023     Chief Complaint  Patient presents with  . Shortness of Breath  . Cough   (Consider location/radiation/quality/duration/timing/severity/associated sxs/prior Treatment) Patient is a 62 y.o. male presenting with shortness of breath and cough. The history is provided by the patient.  Shortness of Breath Associated symptoms: cough   Cough Associated symptoms: shortness of breath   He has been having dyspnea for the last week which has been getting progressively worse. This is associated with a nonproductive cough. He has not had any fever, chills, sweats. He denies chest pain, heaviness, tightness, pressure. He states he says is uncomfortable just because he is working so hard to breathe. He did see his PCP who gave him a prescription for Symbicort inhaler which has not helped. There is no associated nausea, vomiting, diarrhea. He does have a history of COPD.  Past Medical History  Diagnosis Date  . Seizures   . Megaloblastic anemia   . Emphysema of lung    Past Surgical History  Procedure Laterality Date  . Abdominal hernia repair     Family History  Problem Relation Age of Onset  . Hypertension Mother    History  Substance Use Topics  . Smoking status: Current Every Day Smoker -- 0.25 packs/day for 15 years    Types: Cigarettes  . Smokeless tobacco: Never Used     Comment: patient states he is slowly quitting on his own - does not want assistance  . Alcohol Use: Yes     Comment: once every three months    Review of Systems  Respiratory: Positive for cough and shortness of breath.   All other systems reviewed and are negative.    Allergies  Review of patient's allergies indicates no known allergies.  Home Medications   Current Outpatient Rx  Name  Route  Sig  Dispense  Refill  . budesonide-formoterol (SYMBICORT) 160-4.5 MCG/ACT inhaler  Inhalation   Inhale 2 puffs into the lungs 2 (two) times daily.         . diazepam (VALIUM) 5 MG tablet   Oral   Take 5 mg by mouth daily.         . Fe Fum-Vit C-Vit B12-FA (HEMATOGEN FORTE PO)   Oral   Take 1 tablet by mouth daily.         . folic acid (FOLVITE) 1 MG tablet   Oral   Take 1 mg by mouth daily.         . Oral Wound Care Products (MUGARD) LIQD   Mouth/Throat   Use as directed 10 mLs in the mouth or throat 4 (four) times daily as needed (Sore throat).   240 mL   0     Sample bottle given to pt from [facility] pharmacy   . phenytoin (DILANTIN) 200 MG ER capsule   Oral   Take 200 mg by mouth 2 (two) times daily.         . vitamin B-12 (CYANOCOBALAMIN) 1000 MCG tablet   Oral   Take 1,000 mcg by mouth daily.          BP 99/70  Pulse 66  Temp(Src) 97.7 F (36.5 C) (Oral)  Resp 55  SpO2 86% Physical Exam  Nursing note and vitals reviewed.  62 year old male, who is markedly tachypneic but not using accessory muscles of respiration, and is in  no acute distress. Vital signs are significant for tachypnea with respiratory rate of 55. Oxygen saturation is 86%, which is hypoxic. Head is normocephalic and atraumatic. PERRLA, EOMI. Oropharynx is clear. Neck is nontender and supple without adenopathy or JVD. Back is nontender and there is no CVA tenderness. Lungs are have prolonged exhalation phase with faint, coarse wheezes. No rales are appreciated. Air movement is symmetric. Chest is nontender. Heart has regular rate and rhythm without murmur. Abdomen is soft, flat, nontender without masses or hepatosplenomegaly and peristalsis is normoactive. Extremities are cachectic and have no cyanosis or edema, full range of motion is present. Skin is warm and dry without rash. Neurologic: Mental status is normal, cranial nerves are intact, there are no motor or sensory deficits.  ED Course  Procedures (including critical care time) Labs Review Results for orders  placed during the hospital encounter of 01/23/13  MRSA PCR SCREENING      Result Value Range   MRSA by PCR NEGATIVE  NEGATIVE  CBC WITH DIFFERENTIAL      Result Value Range   WBC 38.7 (*) 4.0 - 10.5 K/uL   RBC 2.78 (*) 4.22 - 5.81 MIL/uL   Hemoglobin 10.9 (*) 13.0 - 17.0 g/dL   HCT 96.0 (*) 45.4 - 09.8 %   MCV 111.9 (*) 78.0 - 100.0 fL   MCH 39.2 (*) 26.0 - 34.0 pg   MCHC 35.0  30.0 - 36.0 g/dL   RDW 11.9 (*) 14.7 - 82.9 %   Platelets 302  150 - 400 K/uL   Neutrophils Relative % 91 (*) 43 - 77 %   Lymphocytes Relative 1 (*) 12 - 46 %   Monocytes Relative 8  3 - 12 %   Eosinophils Relative 0  0 - 5 %   Basophils Relative 0  0 - 1 %   Neutro Abs 35.2 (*) 1.7 - 7.7 K/uL   Lymphs Abs 0.4 (*) 0.7 - 4.0 K/uL   Monocytes Absolute 3.1 (*) 0.1 - 1.0 K/uL   Eosinophils Absolute 0.0  0.0 - 0.7 K/uL   Basophils Absolute 0.0  0.0 - 0.1 K/uL   RBC Morphology MILD LEFT SHIFT (1-5% METAS, OCC MYELO, OCC BANDS)     WBC Morphology TOXIC GRANULATION    COMPREHENSIVE METABOLIC PANEL      Result Value Range   Sodium 130 (*) 135 - 145 mEq/L   Potassium 5.0  3.5 - 5.1 mEq/L   Chloride 92 (*) 96 - 112 mEq/L   CO2 17 (*) 19 - 32 mEq/L   Glucose, Bld 145 (*) 70 - 99 mg/dL   BUN 51 (*) 6 - 23 mg/dL   Creatinine, Ser 5.62 (*) 0.50 - 1.35 mg/dL   Calcium 9.6  8.4 - 13.0 mg/dL   Total Protein 8.7 (*) 6.0 - 8.3 g/dL   Albumin 2.9 (*) 3.5 - 5.2 g/dL   AST 17  0 - 37 U/L   ALT 6  0 - 53 U/L   Alkaline Phosphatase 117  39 - 117 U/L   Total Bilirubin 0.5  0.3 - 1.2 mg/dL   GFR calc non Af Amer 53 (*) >90 mL/min   GFR calc Af Amer 61 (*) >90 mL/min  TROPONIN I      Result Value Range   Troponin I 0.44 (*) <0.30 ng/mL  PRO B NATRIURETIC PEPTIDE      Result Value Range   Pro B Natriuretic peptide (BNP) 2575.0 (*) 0 - 125 pg/mL  D-DIMER, QUANTITATIVE  Result Value Range   D-Dimer, Quant 3.32 (*) 0.00 - 0.48 ug/mL-FEU  CG4 I-STAT (LACTIC ACID)      Result Value Range   Lactic Acid, Venous 3.55  (*) 0.5 - 2.2 mmol/L  POCT I-STAT 3, BLOOD GAS (G3+)      Result Value Range   pH, Arterial 7.438  7.350 - 7.450   pCO2 arterial 24.3 (*) 35.0 - 45.0 mmHg   pO2, Arterial 57.0 (*) 80.0 - 100.0 mmHg   Bicarbonate 16.5 (*) 20.0 - 24.0 mEq/L   TCO2 17  0 - 100 mmol/L   O2 Saturation 91.0     Acid-base deficit 6.0 (*) 0.0 - 2.0 mmol/L   Patient temperature 98.6 F     Collection site RADIAL, ALLEN'S TEST ACCEPTABLE     Drawn by Operator     Sample type ARTERIAL     Imaging Review Dg Chest 2 View  01/23/2013   CLINICAL DATA:  Shortness of breath  EXAM: CHEST  2 VIEW  COMPARISON:  11/07/2011  FINDINGS: Dense right upper lobe consolidation. No definite cavitation or effusion. More prominent interstitial markings diffusely. No cardiomegaly. Limited evaluation of the right upper mediastinum due to neighboring consolidation, limiting detection of adenopathy.  Remote bilateral humeral neck fractures. Remote thoracic compression fractures.  IMPRESSION: 1. Dense right upper lobe pneumonia. Recommend followup to radiographic clearing. 2. Emphysema with possible mild superimposed edema.   Electronically Signed   By: Tiburcio Pea   On: 01/23/2013 01:16   Images viewed by me.   Date: 01/23/2013  Rate: 125  Rhythm: Multifocal atrial tachycardia  QRS Axis: normal  Intervals: QT prolonged  ST/T Wave abnormalities: nonspecific ST/T changes  Conduction Disutrbances:none  Narrative Interpretation: Multifocal atrial tachycardia, left atrial hypertrophy, right atrial hypertrophy, left ventricular hypertrophy, nonspecific ST and T changes, prolonged QT interval. No prior ECG available for comparison.  Old EKG Reviewed: none available  CRITICAL CARE Performed by: VOZDG,UYQIH Total critical care time: 75 minutes Critical care time was exclusive of separately billable procedures and treating other patients. Critical care was necessary to treat or prevent imminent or life-threatening deterioration. Critical  care was time spent personally by me on the following activities: development of treatment plan with patient and/or surrogate as well as nursing, discussions with consultants, evaluation of patient's response to treatment, examination of patient, obtaining history from patient or surrogate, ordering and performing treatments and interventions, ordering and review of laboratory studies, ordering and review of radiographic studies, pulse oximetry and re-evaluation of patient's condition.  MDM   1. Community acquired pneumonia   2. COPD (chronic obstructive pulmonary disease)   3. Elevated lactic acid level   4. Elevated troponin   5. Anemia   6. Renal insufficiency   7. MDS (myelodysplastic syndrome), low grade   8. Non-ST elevation MI (NSTEMI)   9. Sepsis    COPD exacerbation. He will be given albuterol with ipratropium. Chest x-ray will be obtained.  Chest x-ray shows large right upper lobe infiltrate. He is started on antibiotics for community-acquired pneumonia.  After initial albuterol with ipratropium, and he was still tachypneic but rate had dropped considerably and he seemed much more comfortable. However, he started becoming more dyspneic once again and was given a second albuterol with ipratropium treatment with little improvement. I am concerned because of the size of his infiltrate and known pre-existing COPD that he may deteriorate to the point where he would require intubation. ABG was obtained which showed no evidence of CO2 retention.  Case is discussed with Dr. Toniann Fail of [facility] who agrees to admit the patient. He'll be admitted to step down unit. I have also discussed the case briefly with the critical care medicine to make sure they were aware of the patient and his possibility for deterioration.  Dione Booze, MD 01/23/13 [phone]

## 2013-01-23 NOTE — Procedures (Signed)
Intubation Procedure Note BLONG BUSK [id number] July 17, 1950  Procedure: Intubation Indications: Respiratory insufficiency  Procedure Details Consent: Unable to obtain consent because of emergent medical necessity. Time Out: Verified patient identification, verified procedure, site/side was marked, verified correct patient position, special equipment/implants available, medications/allergies/relevent history reviewed, required imaging and test results available.  Performed  Maximum sterile technique was used including antiseptics, cap, gloves, gown, hand hygiene, mask and sheet.  Glidescope     Evaluation Hemodynamic Status: BP stable throughout; O2 sats: stable throughout Patient's Current Condition: stable Complications: No apparent complications Patient did tolerate procedure well. Chest X-ray ordered to verify placement.  CXR: pending.  Alyson Reedy, M.D. [facility] Pulmonary/Critical Care Medicine. Pager: [phone]. After hours pager: [phone].

## 2013-01-23 NOTE — Progress Notes (Signed)
Advanced ETT 1cm per MD order. No complications. RT will continue to monitor.

## 2013-01-23 NOTE — ED Notes (Signed)
Family at bedside. 

## 2013-01-23 NOTE — Progress Notes (Signed)
ANTICOAGULATION CONSULT NOTE - Initial Consult  Pharmacy Consult for heparin Indication: chest pain/ACS  No Known Allergies  Patient Measurements: Height: 6' (182.9 cm) Weight: 116 lb 13.5 oz (53 kg) IBW/kg (Calculated) : 77.6  Vital Signs: Temp: 98.6 F (37 C) (09/17 0430) Temp src: Oral (09/17 0430) BP: 115/61 mmHg (09/17 0500) Pulse Rate: 122 (09/17 0330)  Labs:  Recent Labs  01/23/13 0058 01/23/13 0059  HGB 10.9*  --   HCT 31.1*  --   PLT 302  --   CREATININE 1.38*  --   TROPONINI  --  0.44*    Estimated Creatinine Clearance: 40.5 ml/min (by C-G formula based on Cr of 1.38).   Medical History: Past Medical History  Diagnosis Date  . Seizures   . Megaloblastic anemia   . Emphysema of lung     Medications:  Prescriptions prior to admission  Medication Sig Dispense Refill  . budesonide-formoterol (SYMBICORT) 160-4.5 MCG/ACT inhaler Inhale 2 puffs into the lungs 2 (two) times daily.      . cyanocobalamin 500 MCG tablet Take 500 mcg by mouth daily.      . diazepam (VALIUM) 5 MG tablet Take 5 mg by mouth daily.      . Fe Fum-Vit C-Vit B12-FA (HEMATOGEN FORTE PO) Take 1 tablet by mouth daily.      . folic acid (FOLVITE) 400 MCG tablet Take 400 mcg by mouth daily.      . mometasone (NASONEX) 50 MCG/ACT nasal spray Place 2 sprays into the nose daily.      . phenytoin (DILANTIN) 100 MG ER capsule Take 200 mg by mouth 2 (two) times daily.      . predniSONE (DELTASONE) 20 MG tablet Take 20-60 mg by mouth daily. Take 60 mg for 4 days, take 40 mg for 4 days take 20 mg for 4 days       Scheduled:  . [START ON 01/24/2013] azithromycin  500 mg Intravenous Q24H  . budesonide (PULMICORT) nebulizer solution  0.25 mg Nebulization BID  . cefTRIAXone (ROCEPHIN)  IV  1 g Intravenous Q24H  . cyanocobalamin  500 mcg Oral Daily  . diazepam  5 mg Oral Daily  . fluticasone  1 spray Each Nare Daily  . folic acid  400 mcg Oral Daily  . levalbuterol  0.63 mg Nebulization Q6H  .  phenytoin  200 mg Oral BID  . sodium chloride  3 mL Intravenous Q12H   Infusions:  . sodium chloride      Assessment: 62yo male c/o SOB and dry cough x1wk, noted to have elevated troponin, to begin heparin.  Goal of Therapy:  Heparin level 0.3-0.7 units/ml Monitor platelets by anticoagulation protocol: Yes   Plan:  Will give heparin 3000 units IV bolus x1 followed by gtt at 650 units/hr and monitor heparin levels and CBC.  Vernard Gambles, PharmD, BCPS  01/23/2013,5:44 AM

## 2013-01-23 NOTE — Progress Notes (Addendum)
INITIAL NUTRITION ASSESSMENT  DOCUMENTATION CODES Per approved criteria  -Severe malnutrition in the context of chronic illness   INTERVENTION: 1.  Supplements; Ensure Complete po BID, each supplement provides 350 kcal and 13 grams of protein. 2.  Modify diet; recommend diet liberalization to Regular  NUTRITION DIAGNOSIS: Malnutrition related to poor PO intake, ShOB as evidenced by physical exam.   Monitor:  1.  Food/Beverage; pt meeting >/=90% estimated needs with tolerance. 2.  Wt/wt change; monitor trends  Reason for Assessment: MST  62 y.o. male  Admitting Dx: shortness of breath  ASSESSMENT: Pt admitted with shortness of breath.  Pt with PMHx of COPD, MDS, and renal insufficiency. Pt found to have sepsis r/t to pneumonia and NSTEMI.   Nutrition Focused Physical Exam:  Subcutaneous Fat:  Orbital Region: WNL Upper Arm Region: severe wasting Thoracic and Lumbar Region: WNL  Muscle:  Temple Region: severe wasting Clavicle Bone Region: severe wasting Clavicle and Acromion Bone Region: severe wasting Scapular Bone Region: severe wasting Dorsal Hand: moderate-severe wasting Patellar Region: severe wasting Anterior Thigh Region: severe wasting Posterior Calf Region: severe wasting  Edema: none present  RD met with pt who is getting breathing treatment and is extremely tachypneic at time of visit.  Pt is able to state he likes Ensure.  Reviewed meal at bedside with pt who states he might be able to eat a few bites.  Due to WOB at this time, pt not able to eat.  Will order Ensure Complete and continue to follow.  Pt is unable to states his usual wt at time of visit. RD notes chronic poor wt hx with recent loss over the past year.   Pt meets criteria for severe MALNUTRITION in the context of chronic as evidenced by physical exam.    Height: Ht Readings from Last 1 Encounters:  01/23/13 6' (1.829 m)    Weight: Wt Readings from Last 1 Encounters:  01/23/13 116 lb  13.5 oz (53 kg)    Ideal Body Weight: 80.9 kg  % Ideal Body Weight: 65%  Wt Readings from Last 10 Encounters:  01/23/13 116 lb 13.5 oz (53 kg)  09/19/12 122 lb 8 oz (55.566 kg)  06/06/12 126 lb 4.8 oz (57.289 kg)  01/25/12 127 lb 3.2 oz (57.698 kg)  09/21/11 124 lb 6.4 oz (56.427 kg)  05/27/11 128 lb 4.8 oz (58.196 kg)    Usual Body Weight: pt unable to state  BMI:  Body mass index is 15.84 kg/(m^2).  Estimated Nutritional Needs: Kcal: 1700-1900 Protein: 75-95g Fluid: >1.5 L/day  Skin: no issues noted  Diet Order: Cardiac  EDUCATION NEEDS: -Education needs addressed   Intake/Output Summary (Last 24 hours) at 01/23/13 1216 Last data filed at 01/23/13 0347  Gross per 24 hour  Intake    250 ml  Output      0 ml  Net    250 ml    Last BM: PTA   Labs:   Recent Labs Lab 01/23/13 0058 01/23/13 0900  NA 130* 128*  K 5.0 4.3  CL 92* 94*  CO2 17* 17*  BUN 51* 45*  CREATININE 1.38* 1.15  CALCIUM 9.6 8.6  GLUCOSE 145* 134*    CBG (last 3)  No results found for this basename: GLUCAP,  in the last 72 hours  Scheduled Meds: . aspirin  325 mg Oral Daily  . [START ON 01/24/2013] azithromycin  500 mg Intravenous Q24H  . budesonide (PULMICORT) nebulizer solution  0.25 mg Nebulization BID  .  cefTRIAXone (ROCEPHIN)  IV  1 g Intravenous Q24H  . cyanocobalamin  500 mcg Oral Daily  . diazepam  5 mg Oral Daily  . enoxaparin (LOVENOX) injection  40 mg Subcutaneous Q24H  . fluticasone  1 spray Each Nare Daily  . folic acid  400 mcg Oral Daily  . ipratropium  0.5 mg Nebulization Q4H  . levalbuterol  0.63 mg Nebulization Q6H  . phenytoin  200 mg Oral BID  . sodium chloride  3 mL Intravenous Q12H    Continuous Infusions: . sodium chloride 100 mL/hr at 01/23/13 0546    Past Medical History  Diagnosis Date  . Seizures   . Megaloblastic anemia   . Emphysema of lung     Past Surgical History  Procedure Laterality Date  . Abdominal hernia repair      Loyce Dys, MS RD LDN Clinical Inpatient Dietitian Pager: [phone] Weekend/After hours pager: [phone]

## 2013-01-23 NOTE — ED Notes (Signed)
Adrian Fleeting, MD is aware of the pt's elevated troponin.

## 2013-01-23 NOTE — Progress Notes (Signed)
CRITICAL VALUE ALERT  Critical value received: 0.54 troponin  Date of notification: 01/23/13 Time of notification:  0953  Critical value read back:yes  Nurse who received alert:  Gae Gallop RN  MD notified (1st page):  [phone]  Time of first page:  [phone]  MD notified (2nd page):  Time of second page:  Responding MD:  [facility]  Time MD responded:  1000

## 2013-01-23 NOTE — ED Notes (Signed)
EDP made aware of Temp. Orders received. Blanket removed from patient. Remains on monitor. Resting comfortably.

## 2013-01-23 NOTE — Progress Notes (Signed)
SLP Cancellation Note  Patient Details Name: ZAMIER EGGEBRECHT MRN: [id number] DOB: 23-Sep-1950   Cancelled treatment:       Reason Eval/Treat Not Completed: Medical issues which prohibited therapy.  Per RN, intubation pending.  Will sign-off.   Blenda Mounts Laurice 01/23/2013, 1:59 PM

## 2013-01-23 NOTE — ED Notes (Addendum)
[place], MD at bedside 

## 2013-01-23 NOTE — ED Notes (Signed)
Unable to get a good signal with SP02, but when able to get good wave form, monitor showed that the pt was satting 86% on room air. This RN put the pt on 2L. Unable to get a good signal, but monitor showing that the pt is satting 99% on 2L.

## 2013-01-23 NOTE — Progress Notes (Signed)
  Echocardiogram 2D Echocardiogram has been performed.  Adrian [name] 01/23/2013, 11:10 AM

## 2013-01-23 NOTE — Procedures (Signed)
Central Venous Catheter Insertion Procedure Note RUI WORDELL [id number] Jul 04, 1950  Procedure: Insertion of Central Venous Catheter Indications: Assessment of intravascular volume  Procedure Details Consent: Unable to obtain consent because of altered level of consciousness. Time Out: Verified patient identification, verified procedure, site/side was marked, verified correct patient position, special equipment/implants available, medications/allergies/relevent history reviewed, required imaging and test results available.  Performed  Maximum sterile technique was used including antiseptics, cap, gloves, gown, hand hygiene, mask and sheet. Skin prep: Chlorhexidine; local anesthetic administered A antimicrobial bonded/coated triple lumen catheter was placed in the left internal jugular vein using the Seldinger technique.  Evaluation Blood flow good Complications: No apparent complications Patient did tolerate procedure well. Chest X-ray ordered to verify placement.  CXR: pending.  YACOUB,WESAM 01/23/2013, 3:45 PM

## 2013-01-24 ENCOUNTER — Inpatient Hospital Stay (HOSPITAL_COMMUNITY): Payer: BC Managed Care – PPO

## 2013-01-24 DIAGNOSIS — J189 Pneumonia, unspecified organism: Secondary | ICD-10-CM

## 2013-01-24 DIAGNOSIS — E43 Unspecified severe protein-calorie malnutrition: Secondary | ICD-10-CM

## 2013-01-24 DIAGNOSIS — I214 Non-ST elevation (NSTEMI) myocardial infarction: Secondary | ICD-10-CM

## 2013-01-24 DIAGNOSIS — J9600 Acute respiratory failure, unspecified whether with hypoxia or hypercapnia: Secondary | ICD-10-CM

## 2013-01-24 DIAGNOSIS — D4620 Refractory anemia with excess of blasts, unspecified: Secondary | ICD-10-CM

## 2013-01-24 DIAGNOSIS — N179 Acute kidney failure, unspecified: Secondary | ICD-10-CM

## 2013-01-24 LAB — LEGIONELLA ANTIGEN, URINE: Legionella Antigen, Urine: NEGATIVE

## 2013-01-24 LAB — CBC
HCT: 22.5 % — ABNORMAL LOW (ref 39.0–52.0)
MCHC: 36.9 g/dL — ABNORMAL HIGH (ref 30.0–36.0)
Platelets: 300 10*3/uL (ref 150–400)
RDW: 16.1 % — ABNORMAL HIGH (ref 11.5–15.5)
WBC: 38.6 10*3/uL — ABNORMAL HIGH (ref 4.0–10.5)

## 2013-01-24 LAB — GLUCOSE, CAPILLARY
Glucose-Capillary: 167 mg/dL — ABNORMAL HIGH (ref 70–99)
Glucose-Capillary: 173 mg/dL — ABNORMAL HIGH (ref 70–99)

## 2013-01-24 LAB — RETICULOCYTES
RBC.: 2.02 MIL/uL — ABNORMAL LOW (ref 4.22–5.81)
Retic Count, Absolute: 12.1 10*3/uL — ABNORMAL LOW (ref 19.0–186.0)
Retic Ct Pct: 0.6 % (ref 0.4–3.1)

## 2013-01-24 LAB — IRON AND TIBC
Saturation Ratios: 23 % (ref 20–55)
UIBC: 86 ug/dL — ABNORMAL LOW (ref 125–400)

## 2013-01-24 LAB — POCT I-STAT 3, ART BLOOD GAS (G3+)
Acid-base deficit: 10 mmol/L — ABNORMAL HIGH (ref 0.0–2.0)
Bicarbonate: 15.8 mEq/L — ABNORMAL LOW (ref 20.0–24.0)
Patient temperature: 101.4

## 2013-01-24 LAB — BASIC METABOLIC PANEL
BUN: 40 mg/dL — ABNORMAL HIGH (ref 6–23)
Calcium: 7.8 mg/dL — ABNORMAL LOW (ref 8.4–10.5)
Creatinine, Ser: 0.96 mg/dL (ref 0.50–1.35)
GFR calc Af Amer: >90 mL/min (ref >90)
GFR calc non Af Amer: 86 mL/min — ABNORMAL LOW (ref >90)

## 2013-01-24 LAB — FOLATE: Folate: 7.9 ng/mL

## 2013-01-24 MED ORDER — INSULIN ASPART 100 UNIT/ML ~~LOC~~ SOLN
0.0000 [IU] | SUBCUTANEOUS | Status: DC
  Administered 2013-01-24: 3 [IU] via SUBCUTANEOUS
  Administered 2013-01-24: 2 [IU] via SUBCUTANEOUS
  Administered 2013-01-24: 3 [IU] via SUBCUTANEOUS
  Administered 2013-01-25: 2 [IU] via SUBCUTANEOUS
  Administered 2013-01-25: 5 [IU] via SUBCUTANEOUS
  Administered 2013-01-25 – 2013-01-26 (×4): 3 [IU] via SUBCUTANEOUS
  Administered 2013-01-26 (×2): 2 [IU] via SUBCUTANEOUS
  Administered 2013-01-27: 3 [IU] via SUBCUTANEOUS
  Administered 2013-01-27 (×3): 2 [IU] via SUBCUTANEOUS
  Administered 2013-01-27: 3 [IU] via SUBCUTANEOUS
  Administered 2013-01-28 (×5): 2 [IU] via SUBCUTANEOUS
  Administered 2013-01-29: 3 [IU] via SUBCUTANEOUS
  Administered 2013-01-29 – 2013-02-11 (×38): 2 [IU] via SUBCUTANEOUS

## 2013-01-24 MED ORDER — DEXTROSE 5 % IV SOLN
1.0000 g | Freq: Three times a day (TID) | INTRAVENOUS | Status: DC
  Administered 2013-01-24 – 2013-01-27 (×9): 1 g via INTRAVENOUS
  Filled 2013-01-24 (×11): qty 1

## 2013-01-24 MED ORDER — HYDROCORTISONE SOD SUCCINATE 100 MG IJ SOLR
50.0000 mg | Freq: Four times a day (QID) | INTRAMUSCULAR | Status: DC
  Administered 2013-01-24 – 2013-01-25 (×5): 50 mg via INTRAVENOUS
  Filled 2013-01-24 (×8): qty 1

## 2013-01-24 MED ORDER — VASOPRESSIN 20 UNIT/ML IJ SOLN
0.0300 [IU]/min | INTRAVENOUS | Status: DC
  Filled 2013-01-24: qty 2.5

## 2013-01-24 MED ORDER — CHLORHEXIDINE GLUCONATE 0.12 % MT SOLN
15.0000 mL | Freq: Two times a day (BID) | OROMUCOSAL | Status: DC
  Administered 2013-01-24 – 2013-02-12 (×39): 15 mL via OROMUCOSAL
  Filled 2013-01-24 (×41): qty 15

## 2013-01-24 MED ORDER — VANCOMYCIN HCL IN DEXTROSE 750-5 MG/150ML-% IV SOLN
750.0000 mg | Freq: Two times a day (BID) | INTRAVENOUS | Status: DC
  Administered 2013-01-24 – 2013-01-26 (×5): 750 mg via INTRAVENOUS
  Filled 2013-01-24 (×6): qty 150

## 2013-01-24 MED ORDER — NOREPINEPHRINE BITARTRATE 1 MG/ML IJ SOLN
5.0000 ug/min | INTRAVENOUS | Status: DC
  Administered 2013-01-24: 20 ug/min via INTRAVENOUS
  Administered 2013-01-24: 25 ug/min via INTRAVENOUS
  Administered 2013-01-25: 10 ug/min via INTRAVENOUS
  Filled 2013-01-24 (×3): qty 16

## 2013-01-24 MED ORDER — VITAL AF 1.2 CAL PO LIQD
1000.0000 mL | ORAL | Status: DC
  Administered 2013-01-24 – 2013-01-30 (×7): 1000 mL
  Filled 2013-01-24 (×11): qty 1000

## 2013-01-24 MED ORDER — BIOTENE DRY MOUTH MT LIQD
15.0000 mL | Freq: Four times a day (QID) | OROMUCOSAL | Status: DC
  Administered 2013-01-24 – 2013-02-12 (×78): 15 mL via OROMUCOSAL

## 2013-01-24 MED ORDER — SODIUM CHLORIDE 0.9 % IV SOLN
INTRAVENOUS | Status: DC
  Administered 2013-01-24: 21:00:00 via INTRAVENOUS

## 2013-01-24 NOTE — Progress Notes (Signed)
**Note De-Identified  Obfuscation** RT note: Patient tolerated transport to CT.

## 2013-01-24 NOTE — Progress Notes (Signed)
PULMONARY  / CRITICAL CARE MEDICINE  Name: Adrian [name] MRN: [id number] DOB: [birth year]    ADMISSION DATE: 01/23/2013  CONSULTATION DATE: 01/24/2013   REFERRING MD : Serita Kyle, MD  PRIMARY SERVICE:  PCCM   CHIEF COMPLAINT: SOB, cough   BRIEF PATIENT DESCRIPTION: 62 y/o male with PMH ETOH, COPD, anemia, myelodysplastic syndrome admitted 9/17 with large RUL PNA. Decompensated on BiPAP. PCCM was consulted 9/17 for intubation and continued care in ICU.   SIGNIFICANT EVENTS / STUDIES:  9/17 admitted by [facility] with dx of PNA. BiPAP initiated 9/17 Progressive resp failure. PCCM consulted. intubated   9/17 ECHO:  Mild LVH, EF 45-50%, grade 1 diastolic dysfunction, no effusion 9/18 CT w/o Contrast  LINES / TUBES:  ETT 9/17 >>  L IJ CVL9/17 >>  R rad a-line 9/17 >>    CULTURES:  MRSA PCR 9/17>>> NEG Urine strep 9/17>>> NEG  Resp 9/17 >>  Urine 9/17 >>  Blood 9/17 >>    ANTIBIOTICS:  Ceftriaxone 9/17 >>> 9/18 Azithromycin 9/17 >>  Vanc 9/18 >>  Cefepime 9/18  >>  SUBJECTIVE:  Sedated, intubated. RASS -3. Not F/C  VITAL SIGNS: Temp:  [97.4 F (36.3 C)-101.4 F (38.6 C)] 98 F (36.7 C) (09/18 0830) Pulse Rate:  [91-136] 93 (09/18 0825) Resp:  [18-46] 18 (09/18 0825) BP: (93-127)/(47-80) 117/57 mmHg (09/18 0825) SpO2:  [89 %-100 %] 100 % (09/18 0825) Arterial Line BP: (71-173)/(44-116) 96/48 mmHg (09/18 0700) FiO2 (%):  [40 %-100 %] 60 % (09/18 0826) Weight:  [56.9 kg (125 lb 7.1 oz)] 56.9 kg (125 lb 7.1 oz) (09/18 0439)  PHYSICAL EXAMINATION: General:  Sedated, intubated Neuro:  RASS -3, MAEs HEENT:  NCAT, WNL Neck:  Supple, CVL site clean Cardiovascular: RRR, no m/r/g Lungs: course rhonchi bilaterally, diminished breath sounds RUL Abdomen:  Soft, Nontender, + BS  Ext:  No edema, warm    Recent Labs Lab 01/23/13 0058 01/23/13 0900 01/24/13 0500  NA 130* 128* 128*  K 5.0 4.3 4.0  CL 92* 94* 100  CO2 17* 17* 16*  BUN 51* 45* 40*  CREATININE  1.38* 1.15 0.96  GLUCOSE 145* 134* 123*    Recent Labs Lab 01/23/13 0900 01/23/13 1734 01/24/13 0500  HGB 8.8* 8.2* 8.3*  HCT 24.0* 22.3* 22.5*  WBC 38.8* 39.8* 38.6*  PLT 238 250 300   CXR: Dense RUL consolidation with possible pleural cap, increase IS and AS dz in RLL  ASSESSMENT / PLAN:  PULMONARY  A: Acute respiratory failure due to PNA COPD  P:  Vent settings reviewed Vent bundle in place Daily SBT as indicated  CARDIOVASCULAR  A: Shock, septic -  poss cardiogenic component Elevated troponin  Mildly elevated BNP  P:  - Repeat EKG - ischemic changes on initial readings  - f/u BNP  -repeat Troponin  - ASA per ischemic changes on EKG - Follow off heparin gtt - Continue Levophed for MAP goal of 65 mmHg - start Vasopressin/HC 9/18  RENAL  A: Acute renal insufficiency, nonoliguric - improving Hyponatremia   P:  Monitor BMET intermittently Correct electrolytes as indicated Minimize free water  GASTROINTESTINAL  A: Protein calorie malnutrition  P:  Start TFs 9/18 Cont SUP   HEMATOLOGIC  A: Myelodysplastic syndrome  Anemia , chronic - aranesp as outpt   P:  DVT px: cont LMWH Cont aranesp per pharmacy  Cont Vit B12, Folic acid  INFECTIOUS  A: CAP  Severe sepsis P:  Micro and abx as above CT chest  to r/o para-pneumonic effusion vs necrotizing PNA  ENDOCRINE  A: Hyperglycemia without prior hx of DM Chronic steroid use P:  Begin stress dose steroids Begin SSI  NEUROLOGIC  A: Seizure disorder  EtOH abuse Hx P:  Cont sedation protocol Cont phenytoin F/u phenytoin level    30 mins CCM time  Billy Fischer, MD ; [facility] service Mobile [phone].  After 5:30 PM or weekends, call [phone]  Pulmonary and Critical Care Medicine [facility] Pager: [phone]  01/24/2013, 8:36 AM

## 2013-01-24 NOTE — Progress Notes (Signed)
CBG 153 

## 2013-01-24 NOTE — Progress Notes (Signed)
ANTIBIOTIC CONSULT NOTE - INITIAL  Pharmacy Consult for Vancomycin/Cefepime Indication: pneumonia  No Known Allergies  Patient Measurements: Height: 5\' 7"  (170.2 cm) Weight: 125 lb 7.1 oz (56.9 kg) IBW/kg (Calculated) : 66.1  Vital Signs: Temp: 98 F (36.7 C) (09/18 0830) Temp src: Oral (09/18 0830) BP: 117/57 mmHg (09/18 0825) Pulse Rate: 93 (09/18 0825) Intake/Output from previous day: 09/17 0701 - 09/18 0700 In: 4206.6 [I.V.:3003.9; NG/GT:202.7; IV Piggyback:1000] Out: 1215 [Urine:1215] Intake/Output from this shift:    Labs:  Recent Labs  01/23/13 0058 01/23/13 0900 01/23/13 1258 01/23/13 1734 01/24/13 0500  WBC 38.7* 38.8*  --  39.8* 38.6*  HGB 10.9* 8.8*  --  8.2* 8.3*  PLT 302 238  --  250 300  LABCREA  --   --  145.66  --   --   CREATININE 1.38* 1.15  --   --  0.96   Estimated Creatinine Clearance: 62.6 ml/min (by C-G formula based on Cr of 0.96). No results found for this basename: VANCOTROUGH, Leodis Binet, VANCORANDOM, GENTTROUGH, GENTPEAK, GENTRANDOM, TOBRATROUGH, TOBRAPEAK, TOBRARND, AMIKACINPEAK, AMIKACINTROU, AMIKACIN,  in the last 72 hours   Microbiology: Recent Results (from the past 720 hour(s))  MRSA PCR SCREENING     Status: None   Collection Time    01/23/13  4:37 AM      Result Value Range Status   MRSA by PCR NEGATIVE  NEGATIVE Final   Comment:            The GeneXpert MRSA Assay (FDA     approved for NASAL specimens     only), is one component of a     comprehensive MRSA colonization     surveillance program. It is not     intended to diagnose MRSA     infection nor to guide or     monitor treatment for     MRSA infections.  MRSA PCR SCREENING     Status: None   Collection Time    01/23/13  3:25 PM      Result Value Range Status   MRSA by PCR NEGATIVE  NEGATIVE Final   Comment:            The GeneXpert MRSA Assay (FDA     approved for NASAL specimens     only), is one component of a     comprehensive MRSA colonization   surveillance program. It is not     intended to diagnose MRSA     infection nor to guide or     monitor treatment for     MRSA infections.    Medical History: Past Medical History  Diagnosis Date  . Seizures   . Megaloblastic anemia   . Emphysema of lung    Assessment: 62 y/o M with worsening PNA despite treatment with Ceftriaxone and Azithromycin to broaden coverage with Vancomycin and Cefepime. WBC 38.6<39.8, Scr 0.96 with CrCl ~ 60, Tmax 101.4  Vanco 9/18>> Cefepime 9/18>> Azith 9/17 >> CTX 9/17 >>9/18  9/17 Resp>> 9/17 Urine>> 9/17 blood cx>> 9/17 MRSA PCR - negative  Goal of Therapy:  Vancomycin trough level 15-20 mcg/ml  Plan:  -Vancomycin 750 mg IV q12h -Cefepime 1g IV q8h -Azithromycin per MD -Trend WBC, temp, renal function -Trend cultures, radiologic data -De-escalate based on cultures if possible  Thank you for allowing me to take part in this patient's care,  Abran Duke, PharmD Clinical Pharmacist Phone: [phone] Pager: [phone] 01/24/2013 10:27 AM

## 2013-01-24 NOTE — Progress Notes (Signed)
NUTRITION FOLLOW UP / CONSULT  DOCUMENTATION CODES  Per approved criteria   -Severe malnutrition in the context of chronic illness    Intervention:    Continue TF via OGT with Vital AF 1.2 at 40 ml/h, increase by 10 ml every 12 hours to goal rate of 60 ml/h to provide 1728 kcals, 108 gm protein, 1168 ml free water daily.  Recommend monitor magnesium, potassium, and phosphorus daily for at least 3 days, MD to replete as needed, as pt is at risk for refeeding syndrome given severe PCM.  Nutrition Dx:   Malnutrition related to poor PO intake, SOB as evidenced by physical exam. Ongoing.  Goal:   Intake to meet >90% of estimated nutrition needs.  Monitor:   TF tolerance/adequacy, weight trend, labs, vent status.  Assessment:   Patient admitted with shortness of breath. Pt with PMHx of COPD, MDS, and renal insufficiency. Pt found to have sepsis r/t to pneumonia and NSTEMI. Transferred to ICU 9/17 afternoon with respiratory decompensation requiring intubation. Patient is severely malnourished. TF was initiated yesterday evening, currently at goal rate of 40 ml/h, tolerating well per RN. Discussed patient in ICU rounds today.  Patient is currently intubated on ventilator support.  MV: 10.6 L/min Temp:Temp (24hrs), Avg:98.9 F (37.2 C), Min:97.4 F (36.3 C), Max:101.4 F (38.6 C)  Propofol: none   Height: Ht Readings from Last 1 Encounters:  01/23/13 5\' 7"  (1.702 m)    Weight Status:  Trending up with fluids Wt Readings from Last 1 Encounters:  01/24/13 125 lb 7.1 oz (56.9 kg)  01/23/13  116 lb 13.5 oz (53 kg)   Re-estimated needs:  Kcal: 1800 Protein: 85-105 gm Fluid: 1.8-2 L  Skin: no problems  Diet Order: NPO  TF Order: Vital AF 1.2 at 40 ml/h providing 1152 kcals, 72 gm protein, 779 ml free water daily.   Intake/Output Summary (Last 24 hours) at 01/24/13 1013 Last data filed at 01/24/13 0600  Gross per 24 hour  Intake 4206.56 ml  Output   1215 ml  Net 2991.56  ml    Last BM: none documented   Labs:   Recent Labs Lab 01/23/13 0058 01/23/13 0900 01/24/13 0500  NA 130* 128* 128*  K 5.0 4.3 4.0  CL 92* 94* 100  CO2 17* 17* 16*  BUN 51* 45* 40*  CREATININE 1.38* 1.15 0.96  CALCIUM 9.6 8.6 7.8*  GLUCOSE 145* 134* 123*    CBG (last 3)   Recent Labs  01/23/13 2353 01/24/13 0405 01/24/13 0806  GLUCAP 153* 130* 123*    Scheduled Meds: . antiseptic oral rinse  15 mL Mouth Rinse QID  . azithromycin  500 mg Intravenous Q24H  . budesonide (PULMICORT) nebulizer solution  0.25 mg Nebulization BID  . chlorhexidine  15 mL Mouth Rinse BID  . darbepoetin (ARANESP) injection - NON-DIALYSIS  100 mcg Subcutaneous Q Wed-1800  . enoxaparin (LOVENOX) injection  40 mg Subcutaneous Q24H  . ferrous fumarate-b12-vitamic C-folic acid  1 capsule Oral Daily  . folic acid  1 mg Per Tube Daily  . hydrocortisone sod succinate (SOLU-CORTEF) inj  50 mg Intravenous Q6H  . insulin aspart  0-15 Units Subcutaneous Q4H  . ipratropium  0.5 mg Nebulization Q6H  . levalbuterol  0.63 mg Nebulization Q6H  . pantoprazole (PROTONIX) IV  40 mg Intravenous Q24H  . phenytoin (DILANTIN) IV  200 mg Intravenous BID  . sodium chloride  3 mL Intravenous Q12H    Continuous Infusions: . sodium chloride    .  feeding supplement (VITAL AF 1.2 CAL) 1,000 mL (01/24/13 0400)  . fentaNYL infusion INTRAVENOUS Stopped (01/24/13 0830)  . midazolam (VERSED) infusion 2 mg/hr (01/24/13 0830)  . norepinephrine (LEVOPHED) Adult infusion 30 mcg/min (01/24/13 0830)  . vasopressin (PITRESSIN) infusion - *FOR SHOCK*      Joaquin Courts, RD, LDN, CNSC Pager [phone] After Hours Pager [phone]

## 2013-01-25 ENCOUNTER — Inpatient Hospital Stay (HOSPITAL_COMMUNITY): Payer: BC Managed Care – PPO

## 2013-01-25 DIAGNOSIS — J852 Abscess of lung without pneumonia: Secondary | ICD-10-CM

## 2013-01-25 DIAGNOSIS — J850 Gangrene and necrosis of lung: Secondary | ICD-10-CM

## 2013-01-25 LAB — URINE CULTURE: Culture: NO GROWTH

## 2013-01-25 LAB — GLUCOSE, CAPILLARY
Glucose-Capillary: 124 mg/dL — ABNORMAL HIGH (ref 70–99)
Glucose-Capillary: 152 mg/dL — ABNORMAL HIGH (ref 70–99)
Glucose-Capillary: 156 mg/dL — ABNORMAL HIGH (ref 70–99)
Glucose-Capillary: 161 mg/dL — ABNORMAL HIGH (ref 70–99)

## 2013-01-25 LAB — BASIC METABOLIC PANEL
Chloride: 108 mEq/L (ref 96–112)
GFR calc Af Amer: >90 mL/min (ref >90)
Potassium: 4 mEq/L (ref 3.5–5.1)

## 2013-01-25 LAB — CBC
Platelets: 255 10*3/uL (ref 150–400)
RBC: 1.95 MIL/uL — ABNORMAL LOW (ref 4.22–5.81)
WBC: 34.1 10*3/uL — ABNORMAL HIGH (ref 4.0–10.5)

## 2013-01-25 LAB — TROPONIN I: Troponin I: <0.3 ng/mL (ref <0.30)

## 2013-01-25 LAB — PRO B NATRIURETIC PEPTIDE: Pro B Natriuretic peptide (BNP): 4987 pg/mL — ABNORMAL HIGH (ref 0–125)

## 2013-01-25 MED ORDER — METOPROLOL TARTRATE 1 MG/ML IV SOLN
2.5000 mg | INTRAVENOUS | Status: DC | PRN
  Administered 2013-01-25: 5 mg via INTRAVENOUS
  Administered 2013-01-25: 2.5 mg via INTRAVENOUS
  Administered 2013-01-26 (×4): 5 mg via INTRAVENOUS
  Administered 2013-01-28 (×2): 2.5 mg via INTRAVENOUS
  Filled 2013-01-25 (×7): qty 5

## 2013-01-25 MED ORDER — HYDROCORTISONE SOD SUCCINATE 100 MG IJ SOLR
50.0000 mg | Freq: Three times a day (TID) | INTRAMUSCULAR | Status: DC
  Administered 2013-01-25 – 2013-01-26 (×2): 50 mg via INTRAVENOUS
  Filled 2013-01-25 (×5): qty 1

## 2013-01-25 MED ORDER — HYDRALAZINE HCL 20 MG/ML IJ SOLN: 10.0000 mg | INTRAMUSCULAR | Status: DC | PRN

## 2013-01-25 NOTE — Progress Notes (Signed)
PULMONARY  / CRITICAL CARE MEDICINE  Name: Adrian [name] MRN: [id number] DOB: 1951-02-09    ADMISSION DATE: 01/23/2013  CONSULTATION DATE: 01/25/2013   REFERRING MD : Serita Kyle, MD  PRIMARY SERVICE:  PCCM   CHIEF COMPLAINT: No complaints  BRIEF PATIENT DESCRIPTION: 62 y/o male with PMH ETOH, COPD, anemia, myelodysplastic syndrome admitted 9/17 with large RUL PNA. Decompensated on BiPAP. PCCM was consulted 9/17 for intubation and continued care in ICU.    SIGNIFICANT EVENTS / STUDIES:  9/17 admitted by [facility] with dx of PNA. BiPAP initiated 9/17 Progressive resp failure. PCCM consulted. intubated   9/17 ECHO:  Mild LVH, EF 45-50%, grade 1 diastolic dysfunction, Septal dyssynergy. Akinesis base inferior wall. Severe hypokinesis inferolateral wall 9/18 CT w/o Contrast: Dense consolidation consistent  necrotizing PNA in RUL, interstitial PNA throughout R and L lower lobes, increase IS and AS dz in RLL 9/19 RASS 0. + F/C. Still requiring PEEP 8 and FiO2 50%  LINES / TUBES:  ETT 9/17 >>  L IJ CVL9/17 >>  R rad a-line 9/17 >>   CULTURES:  MRSA PCR 9/17>>> NEG Strep Ag 9/17>>> NEG  Legionella Ag 9/17 >> NEG Urine 9/17 >>  NEG Resp 9/17 >> few gram + cocci in pairs >>  Blood 9/17 >>    ANTIBIOTICS:  Ceftriaxone 9/17 >>> 9/18 Azithromycin 9/17 >> 9/19 Vanc 9/18 >>  Cefepime 9/18  >>  SUBJECTIVE:   Intubated. RASS 0. A & O, resting comfortably. Copious purulent ET secretions  VITAL SIGNS: Temp:  [97.4 F (36.3 C)-98.3 F (36.8 C)] 98.1 F (36.7 C) (09/19 0812) Pulse Rate:  [71-115] 107 (09/19 0845) Resp:  [16-34] 22 (09/19 0845) BP: (65-155)/(45-92) 130/76 mmHg (09/19 0845) SpO2:  [90 %-100 %] 95 % (09/19 0845) Arterial Line BP: (55-154)/(34-66) 133/56 mmHg (09/19 0845) FiO2 (%):  [60 %] 60 % (09/19 0800)  PHYSICAL EXAMINATION: General:  Intubated, Alert responds to commands, NAD Neuro:  RASS 0. MAEs HEENT:  NCAT, WNL Neck:  Supple, CVL site  clean Cardiovascular: RRR, no m/r/g Lungs: course rhonchi bilaterally, diminished breath sounds RUL Abdomen:  Soft, Nontender, + BS  Ext:  No edema, warm    Recent Labs Lab 01/23/13 0900 01/24/13 0500 01/25/13 0500  NA 128* 128* 137  K 4.3 4.0 4.0  CL 94* 100 108  CO2 17* 16* 19  BUN 45* 40* 36*  CREATININE 1.15 0.96 0.76  GLUCOSE 134* 123* 179*    Recent Labs Lab 01/23/13 1734 01/24/13 0500 01/25/13 0500  HGB 8.2* 8.3* 7.7*  HCT 22.3* 22.5* 21.8*  WBC 39.8* 38.6* 34.1*  PLT 250 300 255   CXR: NSC  EKG: no ischemic changes noted  Pro BNP: 4987  ASSESSMENT / PLAN:  PULMONARY  A: Acute respiratory failure due to PNA COPD  P:  Vent settings reviewed Vent bundle in place Daily SBT as indicated  CARDIOVASCULAR  A: Shock, septic -  poss cardiogenic component Minimally elevated troponin I Mildly elevated BNP Mild cardiomyopathy with WMAs - suspect ischemic  P:   Cont ASA Follow off heparin gtt Wean NE to off for MAP > 65 mmHg  RENAL  A: Acute renal insufficiency, nonoliguric - improving Hyponatremia, resolved  P:  Monitor BMET intermittently Correct electrolytes as indicated Minimize free water  GASTROINTESTINAL  A: Protein calorie malnutrition  P:  Continue TFs  Cont SUP   HEMATOLOGIC  A: Myelodysplastic syndrome  Anemia , chronic - on aranesp as outpt   P:  DVT px:  cont LMWH Cont aranesp per pharmacy  Cont Vit B12, Folic acid  INFECTIOUS  A: Necrotizing PNA, NOS Severe sepsis P:  Micro and abx as above   ENDOCRINE  A: Hyperglycemia without prior hx of DM Chronic steroid use P:  Taper  stress dose steroids as permitted by BP Continue SSI  NEUROLOGIC  A: Seizure disorder  EtOH abuse Hx P:  Cont sedation protocol and daily WUA Cont phenytoin F/u phenytoin level     CCM X 35 mins   Billy Fischer, MD ; [facility] service Mobile [phone].  After 5:30 PM or weekends, call [phone]  Pulmonary and Critical Care  Medicine [facility] Pager: [phone]  01/25/2013, 10:07 AM

## 2013-01-26 LAB — CULTURE, RESPIRATORY W GRAM STAIN

## 2013-01-26 LAB — GLUCOSE, CAPILLARY
Glucose-Capillary: 117 mg/dL — ABNORMAL HIGH (ref 70–99)
Glucose-Capillary: 100 mg/dL — ABNORMAL HIGH (ref 70–99)

## 2013-01-26 LAB — POCT I-STAT 3, ART BLOOD GAS (G3+)
Acid-base deficit: 8 mmol/L — ABNORMAL HIGH (ref 0.0–2.0)
Bicarbonate: 18.4 mEq/L — ABNORMAL LOW (ref 20.0–24.0)
Bicarbonate: 19 mEq/L — ABNORMAL LOW (ref 20.0–24.0)
Bicarbonate: 19.3 mEq/L — ABNORMAL LOW (ref 20.0–24.0)
Patient temperature: 98.9
Patient temperature: 99
TCO2: 20 mmol/L (ref 0–100)
pCO2 arterial: 40.2 mmHg (ref 35.0–45.0)
pH, Arterial: 7.284 — ABNORMAL LOW (ref 7.350–7.450)
pH, Arterial: 7.359 (ref 7.350–7.450)
pO2, Arterial: 70 mmHg — ABNORMAL LOW (ref 80.0–100.0)

## 2013-01-26 LAB — VANCOMYCIN, TROUGH: Vancomycin Tr: 13 ug/mL (ref 10.0–20.0)

## 2013-01-26 MED ORDER — METOPROLOL TARTRATE 25 MG/10 ML ORAL SUSPENSION
25.0000 mg | Freq: Two times a day (BID) | ORAL | Status: DC
  Administered 2013-01-26 – 2013-02-12 (×35): 25 mg
  Filled 2013-01-26 (×38): qty 10

## 2013-01-26 MED ORDER — ACETAMINOPHEN 325 MG PO TABS: 650.0000 mg | ORAL_TABLET | Freq: Four times a day (QID) | ORAL | Status: DC | PRN

## 2013-01-26 MED ORDER — PANTOPRAZOLE SODIUM 40 MG PO PACK
40.0000 mg | PACK | Freq: Every day | ORAL | Status: DC
  Administered 2013-01-26 – 2013-02-12 (×18): 40 mg
  Filled 2013-01-26 (×18): qty 20

## 2013-01-26 MED ORDER — SODIUM CHLORIDE 0.9 % IV SOLN
200.0000 mg | Freq: Two times a day (BID) | INTRAVENOUS | Status: DC
  Administered 2013-01-26: 200 mg via INTRAVENOUS
  Filled 2013-01-26 (×3): qty 4

## 2013-01-26 MED ORDER — HYDROCORTISONE SOD SUCCINATE 100 MG IJ SOLR
50.0000 mg | Freq: Two times a day (BID) | INTRAMUSCULAR | Status: DC
  Administered 2013-01-26 – 2013-01-27 (×2): 50 mg via INTRAVENOUS
  Filled 2013-01-26 (×4): qty 1

## 2013-01-26 MED ORDER — SODIUM CHLORIDE 0.9 % IV SOLN
INTRAVENOUS | Status: DC
  Administered 2013-01-26 – 2013-01-31 (×4): via INTRAVENOUS
  Administered 2013-02-06 – 2013-02-07 (×2): 10 mL/h via INTRAVENOUS

## 2013-01-26 MED ORDER — ACETAMINOPHEN 160 MG/5ML PO SOLN: 650.0000 mg | Freq: Four times a day (QID) | ORAL | Status: DC | PRN

## 2013-01-26 MED ORDER — PHENYTOIN 125 MG/5ML PO SUSP
200.0000 mg | Freq: Two times a day (BID) | ORAL | Status: DC
  Administered 2013-01-26: 200 mg
  Filled 2013-01-26 (×2): qty 8

## 2013-01-26 MED ORDER — VANCOMYCIN HCL IN DEXTROSE 1-5 GM/200ML-% IV SOLN
1000.0000 mg | Freq: Two times a day (BID) | INTRAVENOUS | Status: DC
  Administered 2013-01-27: 1000 mg via INTRAVENOUS
  Filled 2013-01-26 (×2): qty 200

## 2013-01-26 NOTE — Progress Notes (Signed)
PULMONARY  / CRITICAL CARE MEDICINE  Name: Adrian [name] MRN: [id number] DOB: 19-Oct-1950    ADMISSION DATE: 01/23/2013  CONSULTATION DATE: 01/25/2013   REFERRING MD : Serita Kyle, MD  PRIMARY SERVICE:  PCCM   CHIEF COMPLAINT: No complaints  BRIEF PATIENT DESCRIPTION: 62 y/o male with PMH ETOH, COPD, anemia, myelodysplastic syndrome admitted 9/17 with large RUL PNA. Decompensated on BiPAP. PCCM was consulted 9/17 for intubation and continued care in ICU.    SIGNIFICANT EVENTS / STUDIES:  9/17 admitted by [facility] with dx of PNA. BiPAP initiated 9/17 Progressive resp failure. PCCM consulted. intubated   9/17 ECHO:  Mild LVH, EF 45-50%, grade 1 diastolic dysfunction, Septal dyssynergy. Akinesis base inferior wall. Severe hypokinesis inferolateral wall 9/18 CT w/o Contrast: Dense consolidation consistent  necrotizing PNA in RUL, interstitial PNA throughout R and L lower lobes, increase IS and AS dz in RLL 9/19 RASS 0. + F/C. Still requiring PEEP 8 and FiO2 50% 9/20 Change to PCV mode on vent to enhance comfort and synchrony  LINES / TUBES:  R rad a-line 9/17 >> 9/20 ETT 9/17 >>  L IJ CVL9/17 >>   CULTURES:  MRSA PCR 9/17>>> NEG Strep Ag 9/17>>> NEG  Legionella Ag 9/17 >> NEG Urine 9/17 >>  NEG Resp 9/17 >> few gram + cocci in pairs >>  Blood 9/17 >>    ANTIBIOTICS:  Ceftriaxone 9/17 >>> 9/18 Azithromycin 9/17 >> 9/20 Vanc 9/18 >>  Cefepime 9/18  >>  SUBJECTIVE:   Intubated. RASS 0. A & O, resting comfortably. Copious purulent ET secretions persist  VITAL SIGNS: Temp:  [97.3 F (36.3 C)-99 F (37.2 C)] 98.3 F (36.8 C) (09/20 1100) Pulse Rate:  [97-126] 109 (09/20 1100) Resp:  [22-34] 25 (09/20 1100) BP: (125-173)/(58-90) 133/74 mmHg (09/20 1100) SpO2:  [82 %-99 %] 96 % (09/20 1100) Arterial Line BP: (112-167)/(52-109) 128/54 mmHg (09/20 0900) FiO2 (%):  [60 %-100 %] 70 % (09/20 1000)  PHYSICAL EXAMINATION: General:  Intubated, Alert responds to commands,  NAD Neuro:  RASS 0. MAEs HEENT:  NCAT, WNL Neck:  Supple, CVL site clean Cardiovascular: RRR, no m/r/g Lungs: R>L rhonchi, no wheezes Abdomen:  Soft, Nontender, + BS  Ext:  No edema, warm    Recent Labs Lab 01/23/13 0900 01/24/13 0500 01/25/13 0500  NA 128* 128* 137  K 4.3 4.0 4.0  CL 94* 100 108  CO2 17* 16* 19  BUN 45* 40* 36*  CREATININE 1.15 0.96 0.76  GLUCOSE 134* 123* 179*    Recent Labs Lab 01/23/13 1734 01/24/13 0500 01/25/13 0500  HGB 8.2* 8.3* 7.7*  HCT 22.3* 22.5* 21.8*  WBC 39.8* 38.6* 34.1*  PLT 250 300 255   CXR: NNF  EKG: no new  Pro BNP: no new  ASSESSMENT / PLAN:  PULMONARY  A: Acute respiratory failure due to PNA COPD  P:  Vent adjusted Vent bundle in place Daily SBT as indicated  CARDIOVASCULAR  A: Shock, septic, resolved Minimally elevated troponin I Elevated BNP Mild cardiomyopathy with WMAs - suspect ischemic Hypertension Sinus tachycardia P:   Cont ASA Begin scheduled metoprolol Cont PRN metoprolol to maintain HR < 115 Cont PRN hydralazine to maintain SBP < 170 mmHg Will need to consider further Cards eval after extubation  RENAL  A: Acute renal insufficiency, resolved Hyponatremia, resolved  P:  Monitor BMET intermittently Correct electrolytes as indicated  GASTROINTESTINAL  A: Protein calorie malnutrition  P:  Continue TFs  Cont SUP   HEMATOLOGIC  A:  Myelodysplastic syndrome  Anemia , chronic - on aranesp as outpt   P:  DVT px: cont LMWH Cont aranesp per pharmacy  Cont Vit B12, Folic acid  INFECTIOUS  A: Necrotizing PNA, NOS Severe sepsis, resolved P:  Micro and abx as above   ENDOCRINE  A: Hyperglycemia without prior hx of DM Chronic steroid use P:  Cont taper stress dose steroids Continue SSI  NEUROLOGIC  A: Seizure disorder  EtOH abuse Hx P:  Cont sedation protocol and daily WUA Cont phenytoin    CCM X 35 mins   Billy Fischer, MD ; [facility] service Mobile [phone].   After 5:30 PM or weekends, call [phone]  Pulmonary and Critical Care Medicine [facility] Pager: [phone]  01/26/2013, 11:40 AM

## 2013-01-26 NOTE — Progress Notes (Signed)
Notified elink of HR in the 120's. Metoprol 5mg  given.

## 2013-01-26 NOTE — Progress Notes (Signed)
ANTIBIOTIC CONSULT NOTE - follow Up  Pharmacy Consult for Vancomycin Indication: pneumonia  No Known Allergies  Patient Measurements: Height: 5\' 7"  (170.2 cm) Weight: 125 lb 7.1 oz (56.9 kg) IBW/kg (Calculated) : 66.1  Vital Signs: Temp: 100.5 F (38.1 C) (09/20 2003) Temp src: Oral (09/20 2003) BP: 131/70 mmHg (09/20 2200) Pulse Rate: 114 (09/20 2200) Intake/Output from previous day: 09/19 0701 - 09/20 0700 In: 2972.9 [I.V.:1252.9; NG/GT:1470; IV Piggyback:250] Out: 2910 [Urine:2910] Intake/Output from this shift: Total I/O In: 351.7 [I.V.:61.7; NG/GT:240; IV Piggyback:50] Out: 250 [Urine:250]  Labs:  Recent Labs  01/24/13 0500 01/25/13 0500  WBC 38.6* 34.1*  HGB 8.3* 7.7*  PLT 300 255  CREATININE 0.96 0.76   Estimated Creatinine Clearance: 75.1 ml/min (by C-G formula based on Cr of 0.76).  Recent Labs  01/26/13 2155  VANCOTROUGH 13.0     Microbiology: Recent Results (from the past 720 hour(s))  MRSA PCR SCREENING     Status: None   Collection Time    01/23/13  4:37 AM      Result Value Range Status   MRSA by PCR NEGATIVE  NEGATIVE Final   Comment:            The GeneXpert MRSA Assay (FDA     approved for NASAL specimens     only), is one component of a     comprehensive MRSA colonization     surveillance program. It is not     intended to diagnose MRSA     infection nor to guide or     monitor treatment for     MRSA infections.  CULTURE, BLOOD (ROUTINE X 2)     Status: None   Collection Time    01/23/13  8:00 AM      Result Value Range Status   Specimen Description BLOOD LEFT ARM   Final   Special Requests BOTTLES DRAWN AEROBIC ONLY 5CC   Final   Culture  Setup Time     Final   Value: 01/23/2013 13:06     Performed at [facility]   Culture     Final   Value:        BLOOD CULTURE RECEIVED NO GROWTH TO DATE CULTURE WILL BE HELD FOR 5 DAYS BEFORE ISSUING A FINAL NEGATIVE REPORT     Performed at [facility]   Report Status  PENDING   Incomplete  CULTURE, BLOOD (ROUTINE X 2)     Status: None   Collection Time    01/23/13  8:05 AM      Result Value Range Status   Specimen Description BLOOD LEFT HAND   Final   Special Requests BOTTLES DRAWN AEROBIC ONLY 4CC   Final   Culture  Setup Time     Final   Value: 01/23/2013 13:06     Performed at [facility]   Culture     Final   Value:        BLOOD CULTURE RECEIVED NO GROWTH TO DATE CULTURE WILL BE HELD FOR 5 DAYS BEFORE ISSUING A FINAL NEGATIVE REPORT     Performed at [facility]   Report Status PENDING   Incomplete  MRSA PCR SCREENING     Status: None   Collection Time    01/23/13  3:25 PM      Result Value Range Status   MRSA by PCR NEGATIVE  NEGATIVE Final   Comment:  The GeneXpert MRSA Assay (FDA     approved for NASAL specimens     only), is one component of a     comprehensive MRSA colonization     surveillance program. It is not     intended to diagnose MRSA     infection nor to guide or     monitor treatment for     MRSA infections.  URINE CULTURE     Status: None   Collection Time    01/23/13  5:59 PM      Result Value Range Status   Specimen Description URINE, CATHETERIZED   Final   Special Requests NONE   Final   Culture  Setup Time     Final   Value: 01/23/2013 18:55     Performed at [facility] Count     Final   Value: NO GROWTH     Performed at [facility]   Culture     Final   Value: NO GROWTH     Performed at [facility]   Report Status 01/25/2013 FINAL   Final  CULTURE, RESPIRATORY (NON-EXPECTORATED)     Status: None   Collection Time    01/24/13  1:51 AM      Result Value Range Status   Specimen Description TRACHEAL ASPIRATE   Final   Special Requests NONE   Final   Gram Stain     Final   Value: MODERATE WBC PRESENT, PREDOMINANTLY PMN     RARE SQUAMOUS EPITHELIAL CELLS PRESENT     FEW GRAM POSITIVE COCCI     IN PAIRS     Performed at [facility]    Culture     Final   Value: Non-Pathogenic Oropharyngeal-type Flora Isolated.     Performed at [facility]   Report Status 01/26/2013 FINAL   Final    Medical History: Past Medical History  Diagnosis Date  . Seizures   . Megaloblastic anemia   . Emphysema of lung    Assessment: 62 y/o M with worsening PNA despite treatment with Ceftriaxone and Azithromycin to broaden coverage with Vancomycin and Cefepime. Vanc trough is 13.0   Goal of Therapy:  Vancomycin trough level 15-20 mcg/ml  Plan:  -Increase vancomycin to 1000 mg IV q12h -Trend WBC, temp, renal function -Trend cultures, radiologic data -De-escalate based on cultures if possible  Thank you for allowing me to take part in this patient's care,  Talbert Cage, PharmD Clinical Pharmacist Phone: [phone] 01/26/2013 11:13 PM

## 2013-01-27 ENCOUNTER — Inpatient Hospital Stay (HOSPITAL_COMMUNITY): Payer: BC Managed Care – PPO

## 2013-01-27 LAB — CBC
HCT: 21 % — ABNORMAL LOW (ref 39.0–52.0)
Hemoglobin: 7.6 g/dL — ABNORMAL LOW (ref 13.0–17.0)
MCH: 39.8 pg — ABNORMAL HIGH (ref 26.0–34.0)
MCV: 109.9 fL — ABNORMAL HIGH (ref 78.0–100.0)
RDW: 16.8 % — ABNORMAL HIGH (ref 11.5–15.5)
WBC: 34.5 10*3/uL — ABNORMAL HIGH (ref 4.0–10.5)

## 2013-01-27 LAB — BASIC METABOLIC PANEL
Chloride: 102 mEq/L (ref 96–112)
GFR calc Af Amer: >90 mL/min (ref >90)
Potassium: 3.1 mEq/L — ABNORMAL LOW (ref 3.5–5.1)

## 2013-01-27 LAB — GLUCOSE, CAPILLARY
Glucose-Capillary: 180 mg/dL — ABNORMAL HIGH (ref 70–99)
Glucose-Capillary: 155 mg/dL — ABNORMAL HIGH (ref 70–99)
Glucose-Capillary: 127 mg/dL — ABNORMAL HIGH (ref 70–99)
Glucose-Capillary: 110 mg/dL — ABNORMAL HIGH (ref 70–99)

## 2013-01-27 MED ORDER — POTASSIUM CHLORIDE 20 MEQ/15ML (10%) PO LIQD
ORAL | Status: AC
  Filled 2013-01-27: qty 15

## 2013-01-27 MED ORDER — LEVALBUTEROL HCL 0.63 MG/3ML IN NEBU
0.6300 mg | INHALATION_SOLUTION | RESPIRATORY_TRACT | Status: DC | PRN
  Administered 2013-02-04: 0.63 mg via RESPIRATORY_TRACT

## 2013-01-27 MED ORDER — BUDESONIDE 0.25 MG/2ML IN SUSP
0.2500 mg | Freq: Four times a day (QID) | RESPIRATORY_TRACT | Status: DC
  Administered 2013-01-27 – 2013-02-12 (×63): 0.25 mg via RESPIRATORY_TRACT
  Filled 2013-01-27 (×69): qty 2

## 2013-01-27 MED ORDER — PHENYTOIN 125 MG/5ML PO SUSP
200.0000 mg | Freq: Two times a day (BID) | ORAL | Status: DC
  Administered 2013-01-27 (×2): 200 mg
  Filled 2013-01-27 (×4): qty 8

## 2013-01-27 MED ORDER — FENTANYL CITRATE 0.05 MG/ML IJ SOLN
50.0000 ug | INTRAMUSCULAR | Status: DC | PRN
  Administered 2013-01-28 (×2): 100 ug via INTRAVENOUS
  Filled 2013-01-27 (×2): qty 2

## 2013-01-27 MED ORDER — MIDAZOLAM HCL 2 MG/2ML IJ SOLN
2.0000 mg | INTRAMUSCULAR | Status: DC | PRN
  Administered 2013-01-27 (×2): 2 mg via INTRAVENOUS
  Administered 2013-01-28 (×3): 4 mg via INTRAVENOUS
  Administered 2013-01-30: 2 mg via INTRAVENOUS
  Filled 2013-01-27: qty 4
  Filled 2013-01-27: qty 2
  Filled 2013-01-27 (×2): qty 4
  Filled 2013-01-27 (×2): qty 2

## 2013-01-27 MED ORDER — POTASSIUM CHLORIDE 20 MEQ/15ML (10%) PO LIQD
40.0000 meq | ORAL | Status: AC
  Administered 2013-01-27 (×2): 40 meq
  Filled 2013-01-27 (×2): qty 30

## 2013-01-27 MED ORDER — DEXMEDETOMIDINE HCL IN NACL 200 MCG/50ML IV SOLN
0.2000 ug/kg/h | INTRAVENOUS | Status: DC
  Administered 2013-01-28: 1 ug/kg/h via INTRAVENOUS
  Administered 2013-01-28: 0.4 ug/kg/h via INTRAVENOUS
  Filled 2013-01-27 (×2): qty 50

## 2013-01-27 MED ORDER — AMPICILLIN-SULBACTAM SODIUM 1.5 (1-0.5) G IJ SOLR
1.5000 g | Freq: Four times a day (QID) | INTRAMUSCULAR | Status: DC
  Administered 2013-01-27 – 2013-02-02 (×24): 1.5 g via INTRAVENOUS
  Filled 2013-01-27 (×27): qty 1.5

## 2013-01-27 NOTE — Progress Notes (Signed)
PULMONARY  / CRITICAL CARE MEDICINE  Name: Adrian [name] MRN: [id number] DOB: Sep 01, 1950    ADMISSION DATE: 01/23/2013  CONSULTATION DATE: 01/25/2013   REFERRING MD : Serita Kyle, MD  PRIMARY SERVICE:  PCCM   CHIEF COMPLAINT: No complaints  BRIEF PATIENT DESCRIPTION: 62 y/o male with PMH ETOH, COPD, anemia, myelodysplastic syndrome admitted 9/17 with large RUL PNA. Decompensated on BiPAP. PCCM was consulted 9/17 for intubation and continued care in ICU.    SIGNIFICANT EVENTS / STUDIES:  9/17 admitted by [facility] with dx of PNA. BiPAP initiated 9/17 Progressive resp failure. PCCM consulted. intubated   9/17 ECHO:  Mild LVH, EF 45-50%, grade 1 diastolic dysfunction, Septal dyssynergy. Akinesis base inferior wall. Severe hypokinesis inferolateral wall 9/18 CT w/o Contrast: Dense consolidation consistent  necrotizing PNA in RUL, interstitial PNA throughout R and L lower lobes, increase IS and AS dz in RLL 9/19 RASS 0. + F/C. Still requiring PEEP 8 and FiO2 50% 9/20 Change to PCV mode on vent to enhance comfort and synchrony  LINES / TUBES:  R rad a-line 9/17 >> 9/20 ETT 9/17 >>  L IJ CVL9/17 >>   CULTURES:  MRSA PCR 9/17>>> NEG Strep Ag 9/17>>> NEG  Legionella Ag 9/17 >> NEG Urine 9/17 >>  NEG Resp 9/17 >> NOF Blood 9/17 >>    ANTIBIOTICS:  Ceftriaxone 9/17 >>> 9/18 Azithromycin 9/17 >> 9/20 Vanc 9/18 >> 9/21 Cefepime 9/18  >> 9/21 Unsayn 9/21 >>   SUBJECTIVE:   Intubated. RASS 0. + F/C. Purulent ET secretions persist  VITAL SIGNS: Temp:  [98 F (36.7 C)-100.5 F (38.1 C)] 98.8 F (37.1 C) (09/21 0735) Pulse Rate:  [94-124] 115 (09/21 1119) Resp:  [24-34] 24 (09/21 1119) BP: (107-171)/(56-88) 120/66 mmHg (09/21 0915) SpO2:  [91 %-97 %] 91 % (09/21 1417) FiO2 (%):  [70 %-80 %] 70 % (09/21 1417)  PHYSICAL EXAMINATION: General:  Intubated, Alert responds to commands, NAD Neuro:  RASS 0. MAEs HEENT:  NCAT, WNL Neck:  Supple, CVL site clean Cardiovascular:  RRR, no m/r/g Lungs: R>L rhonchi, no wheezes Abdomen:  Soft, Nontender, + BS  Ext:  No edema, warm    Recent Labs Lab 01/24/13 0500 01/25/13 0500 01/27/13 0400  NA 128* 137 134*  K 4.0 4.0 3.1*  CL 100 108 102  CO2 16* 19 21  BUN 40* 36* 29*  CREATININE 0.96 0.76 0.64  GLUCOSE 123* 179* 157*    Recent Labs Lab 01/24/13 0500 01/25/13 0500 01/27/13 0400  HGB 8.3* 7.7* 7.6*  HCT 22.5* 21.8* 21.0*  WBC 38.6* 34.1* 34.5*  PLT 300 255 219   CXR: NSC  EKG: no new  Pro BNP: no new  ASSESSMENT / PLAN:  PULMONARY  A: Acute respiratory failure due to PNA COPD  P:  Vent adjusted Vent bundle in place Daily SBT as indicated  CARDIOVASCULAR  A: Shock, septic, resolved Minimally elevated troponin I Elevated BNP Mild cardiomyopathy with WMAs - suspect ischemic Hypertension Sinus tachycardia P:   Cont ASA Cont scheduled metoprolol Cont PRN metoprolol to maintain HR < 115 Cont PRN hydralazine to maintain SBP < 170 mmHg Will need to consider further Cards eval after extubation  RENAL  A: Acute renal insufficiency, resolved Hyponatremia, resolved Mild hyperkalemia P:  Monitor BMET intermittently Correct electrolytes as indicated  GASTROINTESTINAL  A: Protein calorie malnutrition  P:  Continue TFs  Cont SUP   HEMATOLOGIC  A: Myelodysplastic syndrome  Anemia , chronic - on aranesp as outpt  P:  DVT px: cont LMWH Cont aranesp per pharmacy  Cont Vit B12, Folic acid  INFECTIOUS  A: Necrotizing PNA, NOS Severe sepsis, resolved P:  Micro and abx as above Will likely need prolonged course of abx (treat as lung abscess)  ENDOCRINE  A: Hyperglycemia without prior hx of DM Chronic steroid use P:  Cont taper stress dose steroids Continue SSI  NEUROLOGIC  A: Seizure disorder  EtOH abuse Hx Deconditioning P:  Change to intermittent sedation Cont phenytoin - change to enteral formulation Mobilize OOB as tolerated PT consult ordered  9/21    CCM X 35 mins   Billy Fischer, MD ; [facility] service Mobile [phone].  After 5:30 PM or weekends, call [phone]  Pulmonary and Critical Care Medicine [facility] Pager: [phone]  01/27/2013, 3:00 PM

## 2013-01-27 NOTE — Progress Notes (Signed)
ANTIBIOTIC CONSULT NOTE - INITIAL  Pharmacy Consult for Unasyn Indication: pneumonia  No Known Allergies  Patient Measurements: Height: 5\' 7"  (170.2 cm) Weight: 125 lb 7.1 oz (56.9 kg) IBW/kg (Calculated) : 66.1 Adjusted Body Weight:    Vital Signs: Temp: 98.8 F (37.1 C) (09/21 0735) Temp src: Oral (09/21 0735) BP: 120/66 mmHg (09/21 0915) Pulse Rate: 94 (09/21 0915) Intake/Output from previous day: 09/20 0701 - 09/21 0700 In: 2497.2 [I.V.:673.2; NG/GT:1220; IV Piggyback:604] Out: 2050 [Urine:2050] Intake/Output from this shift:    Labs:  Recent Labs  01/25/13 0500 01/27/13 0400  WBC 34.1* 34.5*  HGB 7.7* 7.6*  PLT 255 219  CREATININE 0.76 0.64   Estimated Creatinine Clearance: 75.1 ml/min (by C-G formula based on Cr of 0.64).  Recent Labs  01/26/13 2155  VANCOTROUGH 13.0     Microbiology: Recent Results (from the past 720 hour(s))  MRSA PCR SCREENING     Status: None   Collection Time    01/23/13  4:37 AM      Result Value Range Status   MRSA by PCR NEGATIVE  NEGATIVE Final   Comment:            The GeneXpert MRSA Assay (FDA     approved for NASAL specimens     only), is one component of a     comprehensive MRSA colonization     surveillance program. It is not     intended to diagnose MRSA     infection nor to guide or     monitor treatment for     MRSA infections.  CULTURE, BLOOD (ROUTINE X 2)     Status: None   Collection Time    01/23/13  8:00 AM      Result Value Range Status   Specimen Description BLOOD LEFT ARM   Final   Special Requests BOTTLES DRAWN AEROBIC ONLY 5CC   Final   Culture  Setup Time     Final   Value: 01/23/2013 13:06     Performed at [facility]   Culture     Final   Value:        BLOOD CULTURE RECEIVED NO GROWTH TO DATE CULTURE WILL BE HELD FOR 5 DAYS BEFORE ISSUING A FINAL NEGATIVE REPORT     Performed at [facility]   Report Status PENDING   Incomplete  CULTURE, BLOOD (ROUTINE X 2)     Status: None    Collection Time    01/23/13  8:05 AM      Result Value Range Status   Specimen Description BLOOD LEFT HAND   Final   Special Requests BOTTLES DRAWN AEROBIC ONLY 4CC   Final   Culture  Setup Time     Final   Value: 01/23/2013 13:06     Performed at [facility]   Culture     Final   Value:        BLOOD CULTURE RECEIVED NO GROWTH TO DATE CULTURE WILL BE HELD FOR 5 DAYS BEFORE ISSUING A FINAL NEGATIVE REPORT     Performed at [facility]   Report Status PENDING   Incomplete  MRSA PCR SCREENING     Status: None   Collection Time    01/23/13  3:25 PM      Result Value Range Status   MRSA by PCR NEGATIVE  NEGATIVE Final   Comment:            The GeneXpert MRSA Assay (FDA  approved for NASAL specimens     only), is one component of a     comprehensive MRSA colonization     surveillance program. It is not     intended to diagnose MRSA     infection nor to guide or     monitor treatment for     MRSA infections.  URINE CULTURE     Status: None   Collection Time    01/23/13  5:59 PM      Result Value Range Status   Specimen Description URINE, CATHETERIZED   Final   Special Requests NONE   Final   Culture  Setup Time     Final   Value: 01/23/2013 18:55     Performed at [facility] Count     Final   Value: NO GROWTH     Performed at [facility]   Culture     Final   Value: NO GROWTH     Performed at [facility]   Report Status 01/25/2013 FINAL   Final  CULTURE, RESPIRATORY (NON-EXPECTORATED)     Status: None   Collection Time    01/24/13  1:51 AM      Result Value Range Status   Specimen Description TRACHEAL ASPIRATE   Final   Special Requests NONE   Final   Gram Stain     Final   Value: MODERATE WBC PRESENT, PREDOMINANTLY PMN     RARE SQUAMOUS EPITHELIAL CELLS PRESENT     FEW GRAM POSITIVE COCCI     IN PAIRS     Performed at [facility]   Culture     Final   Value: Non-Pathogenic Oropharyngeal-type Flora  Isolated.     Performed at [facility]   Report Status 01/26/2013 FINAL   Final    Medical History: Past Medical History  Diagnosis Date  . Seizures   . Megaloblastic anemia   . Emphysema of lung     Assessment: Admit Complaint: 62 yo M admitted 01/23/2013 with worsening SOB over a week, productive cough, chills, fever noted to have large RUL PNA.   PMH: ETOH, COPD, anemia, myelodysplastic syndrome  Infectious Disease: PNA, PCT 6.10, Tmax 100.5, WBC 34.5 (steroids now off).  Unasyn 9/21>> Vanco 9/18>>9/21 --9/20: VT 13. Dose increased Cefepime 9/18>>9/21 Azith 9/17 >>9/20 CTX 9/17 >>9/18  9/17 Resp>> Oral flora 9/17 Urine>> NEG 9/17 blood cx - pending 9/17 MRSA PCR - negative  Plan:  Change abx to Unasyn 1.5g IV q6hrs.  Merilynn Finland, [employer] 01/27/2013,9:16 AM

## 2013-01-27 NOTE — Progress Notes (Signed)
eLink Physician-Brief Progress Note Patient Name: RONAV FURNEY DOB: 1950/06/06 MRN: [id number]  Date of Service  01/27/2013   HPI/Events of Note  Hypokalemia   eICU Interventions  Potassium replaced   Intervention Category Intermediate Interventions: Electrolyte abnormality - evaluation and management  DETERDING,ELIZABETH 01/27/2013, 5:34 AM

## 2013-01-28 ENCOUNTER — Inpatient Hospital Stay (HOSPITAL_COMMUNITY): Payer: BC Managed Care – PPO

## 2013-01-28 DIAGNOSIS — D649 Anemia, unspecified: Secondary | ICD-10-CM

## 2013-01-28 LAB — COMPREHENSIVE METABOLIC PANEL
ALT: 7 U/L (ref 0–53)
AST: 16 U/L (ref 0–37)
Albumin: 1.6 g/dL — ABNORMAL LOW (ref 3.5–5.2)
Alkaline Phosphatase: 101 U/L (ref 39–117)
BUN: 35 mg/dL — ABNORMAL HIGH (ref 6–23)
CO2: 24 mEq/L (ref 19–32)
Calcium: 8.6 mg/dL (ref 8.4–10.5)
Chloride: 108 mEq/L (ref 96–112)
Creatinine, Ser: 0.66 mg/dL (ref 0.50–1.35)
GFR calc Af Amer: >90 mL/min (ref >90)
GFR calc non Af Amer: >90 mL/min (ref >90)
Glucose, Bld: 134 mg/dL — ABNORMAL HIGH (ref 70–99)
Potassium: 4 mEq/L (ref 3.5–5.1)
Sodium: 140 mEq/L (ref 135–145)
Total Bilirubin: 0.3 mg/dL (ref 0.3–1.2)
Total Protein: 6 g/dL (ref 6.0–8.3)

## 2013-01-28 LAB — CBC
HCT: 19.5 % — ABNORMAL LOW (ref 39.0–52.0)
Hemoglobin: 7.1 g/dL — ABNORMAL LOW (ref 13.0–17.0)
MCH: 39.7 pg — ABNORMAL HIGH (ref 26.0–34.0)
MCHC: 36.4 g/dL — ABNORMAL HIGH (ref 30.0–36.0)
Platelets: 187 10*3/uL (ref 150–400)
RBC: 1.79 MIL/uL — ABNORMAL LOW (ref 4.22–5.81)

## 2013-01-28 LAB — POCT I-STAT 3, ART BLOOD GAS (G3+)
Acid-base deficit: 2 mmol/L (ref 0.0–2.0)
O2 Saturation: 93 %
pCO2 arterial: 51.8 mmHg — ABNORMAL HIGH (ref 35.0–45.0)

## 2013-01-28 LAB — GLUCOSE, CAPILLARY
Glucose-Capillary: 142 mg/dL — ABNORMAL HIGH (ref 70–99)
Glucose-Capillary: 119 mg/dL — ABNORMAL HIGH (ref 70–99)

## 2013-01-28 MED ORDER — PROPOFOL 10 MG/ML IV EMUL
5.0000 ug/kg/min | INTRAVENOUS | Status: DC
  Administered 2013-01-28: 5 ug/kg/min via INTRAVENOUS
  Administered 2013-01-29: 15 ug/kg/min via INTRAVENOUS
  Filled 2013-01-28 (×2): qty 100

## 2013-01-28 MED ORDER — FENTANYL BOLUS VIA INFUSION
25.0000 ug | Freq: Four times a day (QID) | INTRAVENOUS | Status: DC | PRN
  Administered 2013-01-28 (×2): 25 ug via INTRAVENOUS
  Administered 2013-01-30: 100 ug via INTRAVENOUS
  Administered 2013-01-31 (×2): 50 ug via INTRAVENOUS
  Administered 2013-01-31: 100 ug via INTRAVENOUS
  Administered 2013-02-03 (×2): 50 ug via INTRAVENOUS
  Administered 2013-02-03: 100 ug via INTRAVENOUS
  Filled 2013-01-28 (×2): qty 100

## 2013-01-28 MED ORDER — SODIUM CHLORIDE 0.9 % IV SOLN
200.0000 mg | Freq: Two times a day (BID) | INTRAVENOUS | Status: DC
  Administered 2013-01-28 – 2013-02-12 (×31): 200 mg via INTRAVENOUS
  Filled 2013-01-28 (×60): qty 4

## 2013-01-28 MED ORDER — SODIUM CHLORIDE 0.9 % IV SOLN
25.0000 ug/h | INTRAVENOUS | Status: DC
  Administered 2013-01-28: 50 ug/h via INTRAVENOUS
  Administered 2013-01-28: 100 ug/h via INTRAVENOUS
  Administered 2013-01-29: 150 ug/h via INTRAVENOUS
  Administered 2013-01-30: 50 ug/h via INTRAVENOUS
  Administered 2013-01-30: 75 ug/h via INTRAVENOUS
  Administered 2013-01-31 (×2): 250 ug/h via INTRAVENOUS
  Administered 2013-02-01: 150 ug/h via INTRAVENOUS
  Administered 2013-02-03: 100 ug/h via INTRAVENOUS
  Administered 2013-02-04: 200 ug/h via INTRAVENOUS
  Filled 2013-01-28 (×13): qty 50

## 2013-01-28 NOTE — Progress Notes (Signed)
Vent changes made per order Dr. Vassie Loll d/t pt tachypnic, pt appears air hungry, and pt w/ auto peep.  Also, MD ordering Propofol to be given.

## 2013-01-28 NOTE — Progress Notes (Signed)
eLink Physician-Brief Progress Note Patient Name: Adrian [name] DOB: 1950-06-05 MRN: [id number]  Date of Service  01/28/2013   HPI/Events of Note   Asynchrony with high PEEP alarms  eICU Interventions  Propofol gtt for deeper sedation PRVC   Intervention Category Major Interventions: Respiratory failure - evaluation and management  Adrian [name],Adrian V. 01/28/2013, 3:48 PM

## 2013-01-28 NOTE — Progress Notes (Signed)
PT Cancellation Note  Patient Details Name: FRITZ CAUTHON MRN: [id number] DOB: Aug 30, 1950   Cancelled Treatment:    Reason Eval/Treat Not Completed: Medical issues which prohibited therapy (pt on vent with FiO2 70% and peep 8 not yet appropriate to mobilize on vent without specific orders from MD indicating safety beyond department parameters of FiO2 needing to be 40% or below)   Toney Sang Beth 01/28/2013, 7:09 AM Delaney Meigs, PT [phone]

## 2013-01-28 NOTE — Progress Notes (Signed)
Dr. Marchelle Gearing notified of 1233 ABG results.  No new RT orders rec'd.

## 2013-01-28 NOTE — Progress Notes (Signed)
PULMONARY  / CRITICAL CARE MEDICINE  Name: Adrian [name] MRN: [id number] DOB: Jul 16, 1950  PCP [name], MD    ADMISSION DATE: 01/23/2013  CONSULTATION DATE: 01/25/2013  LOS 5 days   REFERRING MD : Serita Kyle, MD  PRIMARY SERVICE:  PCCM   CHIEF COMPLAINT: No complaints  BRIEF PATIENT DESCRIPTION: 62 y/o male with PMH ETOH, COPD, anemia, myelodysplastic syndrome admitted 9/17 with large RUL PNA. Decompensated on BiPAP. PCCM was consulted 9/17 for intubation and continued care in ICU.   LINES / TUBES:  R rad a-line 9/17 >> 9/20 ETT 9/17 >>  L IJ CVL9/17 >>   CULTURES:  MRSA PCR 9/17>>> NEG Strep Ag 9/17>>> NEG  Legionella Ag 9/17 >> NEG Urine 9/17 >>  NEG Resp 9/17 >> NOF Blood 9/17 >>       ANTIBIOTICS:  Ceftriaxone 9/17 >>> 9/18 Azithromycin 9/17 >> 9/20 Vanc 9/18 >> 9/21 Cefepime 9/18  >> 9/21 Unsayn 9/21 >>   SIGNIFICANT EVENTS / STUDIES:  9/17 admitted by [facility] with dx of PNA. BiPAP initiated 9/17 Progressive resp failure. PCCM consulted. intubated   9/17 ECHO:  Mild LVH, EF 45-50%, grade 1 diastolic dysfunction, Septal dyssynergy. Akinesis base inferior wall. Severe hypokinesis inferolateral wall 9/18 CT w/o Contrast: Dense consolidation consistent  necrotizing PNA in RUL, interstitial PNA throughout R and L lower lobes, increase IS and AS dz in RLL 9/19 RASS 0. + F/C. Still requiring PEEP 8 and FiO2 50% 9/20 Change to PCV mode on vent to enhance comfort and synchrony  01/27/13:  Intubated. RASS 0. + F/C. Purulent ET secretions persist    SUBJECTIVE/OVERNIGHT/INTERVAL HX 01/28/13: On 70% fio2, peep 8, RR 18, PCV  and has significnat air trapping and vent dysonchrony despite sedation. RN reporting foul smelling secretions  VITAL SIGNS: Temp:  [98.6 F (37 C)-99.9 F (37.7 C)] 99.9 F (37.7 C) (09/22 0752) Pulse Rate:  [104-124] 124 (09/22 0740) Resp:  [21-42] 42 (09/22 0740) BP: (95-160)/(54-82) 126/61 mmHg (09/22 0740) SpO2:  [87 %-98  %] 93 % (09/22 0740) FiO2 (%):  [70 %] 70 % (09/22 0740) Weight:  [63.3 kg (139 lb 8.8 oz)] 63.3 kg (139 lb 8.8 oz) (09/22 0430)  PHYSICAL EXAMINATION: General:  Intubated, Alert responds to commands, NAD Neuro:  RASS 0. MAEs HEENT:  NCAT, WNL Neck:  Supple, CVL site clean Cardiovascular: RRR, no m/r/g Lungs: R>L rhonchi, no wheezes Abdomen:  Soft, Nontender, + BS  Ext:  No edema, warm   PULMONARY  Recent Labs Lab 01/23/13 1647 01/23/13 2200 01/24/13 0459 01/26/13 0111 01/26/13 0632 01/26/13 0942  PHART 7.334*  --  7.266* 7.247* 7.284* 7.359  PCO2ART 35.5  --  35.5 42.3 40.2 34.4*  PO2ART 262.0*  --  66.0* 105.0* 58.0* 70.0*  HCO3 18.8*  --  15.8* 18.4* 19.0* 19.3*  TCO2 20  --  17 20 20 20   O2SAT 100.0 61.6 87.0 97.0 86.0 93.0    CBC  Recent Labs Lab 01/25/13 0500 01/27/13 0400 01/28/13 0405  HGB 7.7* 7.6* 7.1*  HCT 21.8* 21.0* 19.5*  WBC 34.1* 34.5* 38.7*  PLT 255 219 187    COAGULATION  Recent Labs Lab 01/23/13 1730  INR 1.67*    CARDIAC   Recent Labs Lab 01/23/13 0059 01/23/13 0800 01/23/13 1428 01/23/13 1734 01/25/13 0500  TROPONINI 0.44* 0.54* 0.41* 0.54* <0.30    Recent Labs Lab 01/23/13 0059 01/25/13 0500  PROBNP 2575.0* 4987.0*     CHEMISTRY  Recent Labs Lab 01/23/13 0900 01/24/13  0500 01/25/13 0500 01/27/13 0400 01/28/13 0405  NA 128* 128* 137 134* 140  K 4.3 4.0 4.0 3.1* 4.0  CL 94* 100 108 102 108  CO2 17* 16* 19 21 24   GLUCOSE 134* 123* 179* 157* 134*  BUN 45* 40* 36* 29* 35*  CREATININE 1.15 0.96 0.76 0.64 0.66  CALCIUM 8.6 7.8* 8.1* 8.3* 8.6   Estimated Creatinine Clearance: 83.5 ml/min (by C-G formula based on Cr of 0.66).   LIVER  Recent Labs Lab 01/23/13 0058 01/23/13 1730 01/28/13 0405  AST 17  --  16  ALT 6  --  7  ALKPHOS 117  --  101  BILITOT 0.5  --  0.3  PROT 8.7*  --  6.0  ALBUMIN 2.9*  --  1.6*  INR  --  1.67*  --      INFECTIOUS  Recent Labs Lab 01/23/13 0113 01/23/13 1730  01/24/13 0500 01/25/13 0500  LATICACIDVEN 3.55* 1.0  --   --   PROCALCITON  --  7.28 6.10 4.31     ENDOCRINE CBG (last 3)   Recent Labs  01/27/13 2333 01/28/13 0337 01/28/13 0748  GLUCAP 116* 147* 142*         IMAGING x48h  Dg Chest Port 1 View  01/28/2013   CLINICAL DATA:  Respiratory failure. Shortness of Breath.  EXAM: PORTABLE CHEST - 1 VIEW  COMPARISON:  Plain film of the chest 01/25/2013 and 01/27/2013. CT chest 01/24/2013.  FINDINGS: Support tubes and lines are unchanged. Extensive bilateral airspace disease, worst in the right upper lobe persists. There is increased airspace disease in the mid and lower lung zones bilaterally since yesterday's exam. No pneumothorax is identified.  IMPRESSION: Extensive bilateral airspace disease most confluent in the right upper lobe has worsened in the mid and lower lung zones bilaterally since yesterday's study.   Electronically Signed   By: Drusilla Kanner M.D.   On: 01/28/2013 03:52   Dg Chest Port 1 View  01/27/2013   CLINICAL DATA:  Pneumonia  EXAM: PORTABLE CHEST - 1 VIEW  COMPARISON:  01/25/2013  FINDINGS: Endotracheal tube, nasogastric tube, and left subclavian central line are stable in position. Dense right upper lobe airspace consolidation. Coarse interstitial and alveolar opacities throughout the remainder of the lung fields, right worse than left. Heart size upper limits normal. Regional bones unremarkable. No definite effusion.  IMPRESSION: Stable appearance since previous portable exam   Electronically Signed   By: Oley Balm M.D.   On: 01/27/2013 05:19       PULMONARY  A: Acute respiratory failure due to RUL necrotizing PNA COPD  01/28/13: Significant air trapping  And autopeep  P:  Vent adjusted to drecease i time and decrease RR ; improved parameters Vent bundle in place Daily SBT as indicated Heading towards trach  CARDIOVASCULAR  A: Shock, septic, resolved Minimally elevated troponin I Elevated  BNP Mild cardiomyopathy with WMAs - suspect ischemic Hypertension Sinus tachycardia  01/28/13: Not on pressors P:   Cont ASA Cont scheduled metoprolol Cont PRN metoprolol to maintain HR < 115 Cont PRN hydralazine to maintain SBP < 170 mmHg Will need to consider further Cards eval after extubation  RENAL  A: Acute renal insufficiency, resolved   P:  Monitor BMET intermittently Correct electrolytes as indicated  GASTROINTESTINAL  A: Protein calorie malnutrition  P:  Continue TFs  Cont SUP   HEMATOLOGIC  A: Myelodysplastic syndrome  Anemia , chronic and critical illness- on aranesp as outpt  01/28/13: hgb 7.1gm% and profile c/w anemia of chrnic and critical illness. No visible bleed  P:  DVT px: cont LMWH Cont aranesp per pharmacy  Cont Vit B12, Folic acid PRBC forhgb < 7gm%  INFECTIOUS  A: Septic shock - resolved 01/24/13 Necrotizing PNA, NOS  01/28/13: On Rx with unasyn  P:  Micro and abx as above Will likely need prolonged course of abx (treat as lung abscess)  ENDOCRINE  A: Hyperglycemia without prior hx of DM Chronic steroid use P:  Cont taper stress dose steroids Continue SSI  NEUROLOGIC  A: Seizure disorder  EtOH abuse Hx Deconditioning  01/28/13: Follows commands on WUA.  Failed precedex due to agitation. On fentanyl gtt  P:  Fent gtt +  intermittent sedation Cont phenytoin - changeback to IV forumation due to convenience and binding interaction on po route    GLOBAL 01/28/13: Daughter and wife are family. Not at bedside. [employer] social work and IT consultant with them   The patient is critically ill with multiple organ systems failure and requires high complexity decision making for assessment and support, frequent evaluation and titration of therapies, application of advanced monitoring technologies and extensive interpretation of multiple databases.   Critical Care Time devoted to patient care services described in this note is   45  Minutes.  Dr. Kalman Shan, M.D., [facility].C.P Pulmonary and Critical Care Medicine Staff Physician  System [place] [facility] Pager: [phone], If no answer or between  15:00h - 7:00h: call [phone]  01/28/2013 10:01 AM

## 2013-01-28 NOTE — Progress Notes (Signed)
Pt very tachypnic, I suctioned pt for mod-lg amt sectretions.  RN aware.

## 2013-01-28 NOTE — Progress Notes (Signed)
eLink Physician-Brief Progress Note Patient Name: Adrian [name] DOB: [birth year] MRN: [id number]  Date of Service  01/28/2013   HPI/Events of Note   hgb noted, about unchanged, no change in clincial status  eICU Interventions  Cbc nooon      Nelda Bucks. 01/28/2013, 4:56 AM

## 2013-01-28 NOTE — Progress Notes (Signed)
Vent changes made per Dr. Marchelle Gearing at bedside d/t pt tachypnea and autopeep.

## 2013-01-28 NOTE — Progress Notes (Signed)
Chaplain responded to consult request for general spiritual support for patient with pnemonia. Chaplain visited with patient, who was intubated but awake and alert, and patient's wife, Steward Drone. Chaplain was present during wife's consult with nurse and doctor. Steward Drone expressed that "it's very hard" to see her husband "struggling to breathe." Chaplain provided compassionate presence, active listening, affirming of couple's support system of daughter and church, and prayer.  Maurene Capes, Chaplain Resident   01/28/13 1300  Clinical Encounter Type  Visited With Patient and family together;Health care provider (RN and Doctor)  Visit Type Initial;Spiritual support;Critical Care  Referral From Physician  Spiritual Encounters  Spiritual Needs Prayer;Emotional  Stress Factors  Patient Stress Factors Health changes;Loss of control  Family Stress Factors Health changes

## 2013-01-29 ENCOUNTER — Inpatient Hospital Stay (HOSPITAL_COMMUNITY): Payer: BC Managed Care – PPO

## 2013-01-29 LAB — BASIC METABOLIC PANEL
CO2: 25 mEq/L (ref 19–32)
Calcium: 8.1 mg/dL — ABNORMAL LOW (ref 8.4–10.5)
Creatinine, Ser: 0.87 mg/dL (ref 0.50–1.35)
GFR calc Af Amer: >90 mL/min (ref >90)
GFR calc non Af Amer: 89 mL/min — ABNORMAL LOW (ref >90)
Glucose, Bld: 123 mg/dL — ABNORMAL HIGH (ref 70–99)
Potassium: 4.6 mEq/L (ref 3.5–5.1)

## 2013-01-29 LAB — CULTURE, BLOOD (ROUTINE X 2): Culture: NO GROWTH

## 2013-01-29 LAB — CBC WITH DIFFERENTIAL/PLATELET
Basophils Absolute: 0.3 10*3/uL — ABNORMAL HIGH (ref 0.0–0.1)
Eosinophils Absolute: 0 10*3/uL (ref 0.0–0.7)
Lymphocytes Relative: 4 % — ABNORMAL LOW (ref 12–46)
MCH: 38.6 pg — ABNORMAL HIGH (ref 26.0–34.0)
MCHC: 34.3 g/dL (ref 30.0–36.0)
Monocytes Absolute: 2.1 10*3/uL — ABNORMAL HIGH (ref 0.1–1.0)
Monocytes Relative: 6 % (ref 3–12)
Neutro Abs: 30.7 10*3/uL — ABNORMAL HIGH (ref 1.7–7.7)
Neutrophils Relative %: 89 % — ABNORMAL HIGH (ref 43–77)
Platelets: 190 10*3/uL (ref 150–400)
RBC: 1.58 MIL/uL — ABNORMAL LOW (ref 4.22–5.81)
RDW: 17.7 % — ABNORMAL HIGH (ref 11.5–15.5)
WBC Morphology: INCREASED
WBC: 34.5 10*3/uL — ABNORMAL HIGH (ref 4.0–10.5)

## 2013-01-29 LAB — POCT I-STAT 3, ART BLOOD GAS (G3+)
Acid-base deficit: 2 mmol/L (ref 0.0–2.0)
pCO2 arterial: 51.3 mmHg — ABNORMAL HIGH (ref 35.0–45.0)
pH, Arterial: 7.303 — ABNORMAL LOW (ref 7.350–7.450)
pO2, Arterial: 103 mmHg — ABNORMAL HIGH (ref 80.0–100.0)

## 2013-01-29 LAB — GLUCOSE, CAPILLARY
Glucose-Capillary: 110 mg/dL — ABNORMAL HIGH (ref 70–99)
Glucose-Capillary: 117 mg/dL — ABNORMAL HIGH (ref 70–99)

## 2013-01-29 LAB — PHOSPHORUS: Phosphorus: 3.5 mg/dL (ref 2.3–4.6)

## 2013-01-29 LAB — MAGNESIUM: Magnesium: 2 mg/dL (ref 1.5–2.5)

## 2013-01-29 MED ORDER — FREE WATER
300.0000 mL | Freq: Four times a day (QID) | Status: DC
  Administered 2013-01-29 – 2013-01-30 (×4): 300 mL

## 2013-01-29 NOTE — Progress Notes (Signed)
Palliative Medicine Team consult for goals of care received; patient seen at bedside x2- both times no family at bedside; patient awake, aware, attempts to communicate with staff; per staff RN Candance wife has not yet returned to hospital -per Staff RN Dr Marchelle Gearing has not spoken to wife about PMT consult and she was planning to inform wife upon her return to unit- will await primary team informing family of consult request before PMT engages Writer will follow up in am    Valente David, RN 01/29/2013, 6:04 PM Palliative Medicine Team RN Liaison [phone]

## 2013-01-29 NOTE — Progress Notes (Signed)
CRITICAL VALUE ALERT  Critical value received:  Hemoglobin 6.1  Date of notification:  01-29-13  Time of notification:  0430  Critical value read back:yes  Nurse who received alert:  Corliss Skains RN  MD notified (1st page):  Dr. Herma Carson  Time of first page:  0430  MD notified (2nd page):  Time of second page:  Responding MD:  Dr. Herma Carson  Time MD responded:  [phone]

## 2013-01-29 NOTE — Progress Notes (Signed)
eMD notified of Hgb 6.1

## 2013-01-29 NOTE — Progress Notes (Signed)
PT Cancellation Note  Patient Details Name: Adrian [name] MRN: [id number] DOB: 12/25/1950   Cancelled Treatment:    Reason Eval/Treat Not Completed: Medical issues which prohibited therapy (pt remains with FiO2 70% and peep 10 with Hgb 6.1)   Toney Sang [facility] 01/29/2013, 7:17 AM Delaney Meigs, PT [phone]

## 2013-01-29 NOTE — Progress Notes (Addendum)
PULMONARY  / CRITICAL CARE MEDICINE  Name: Adrian [name] MRN: [id number] DOB: [birth year]  PCP [name], MD    ADMISSION DATE: 01/23/2013  CONSULTATION DATE: 01/25/2013  LOS 6 days   REFERRING MD : Serita Kyle, MD  PRIMARY SERVICE:  PCCM   CHIEF COMPLAINT: No complaints  BRIEF PATIENT DESCRIPTION: 62 y/o male with PMH ETOH, COPD, anemia, myelodysplastic syndrome ([facility]) admitted 9/17 with large RUL PNA. Decompensated on BiPAP. PCCM was consulted 9/17 for intubation and continued care in ICU.   LINES / TUBES:  R rad a-line 9/17 >> 9/20 ETT 9/17 >>  L IJ CVL9/17 >>   CULTURES:  MRSA PCR 9/17>>> NEG Strep Ag 9/17>>> NEG  Legionella Ag 9/17 >> NEG Urine 9/17 >>  NEG Resp 9/17 >> NOF Blood 9/17 >>       ANTIBIOTICS:  Ceftriaxone 9/17 >>> 9/18 Azithromycin 9/17 >> 9/20 Vanc 9/18 >> 9/21 Cefepime 9/18  >> 9/21 Unsayn 9/21 >>   SIGNIFICANT EVENTS / STUDIES:  9/17 admitted by [facility] with dx of PNA. BiPAP initiated 9/17 Progressive resp failure. PCCM consulted. intubated   9/17 ECHO:  Mild LVH, EF 45-50%, grade 1 diastolic dysfunction, Septal dyssynergy. Akinesis base inferior wall. Severe hypokinesis inferolateral wall 9/18 CT w/o Contrast: Dense consolidation consistent  necrotizing PNA in RUL, interstitial PNA throughout R and L lower lobes, increase IS and AS dz in RLL 9/19 RASS 0. + F/C. Still requiring PEEP 8 and FiO2 50% 9/20 Change to PCV mode on vent to enhance comfort and synchrony  01/27/13:  Intubated. RASS 0. + F/C. Purulent ET secretions persist  01/28/13: On 70% fio2, peep 8, RR 18, PCV  and has significnat air trapping and vent dysonchrony despite sedation. RN reporting foul smelling secretions   SUBJECTIVE/OVERNIGHT/INTERVAL HX  01/29/13:  No change. 70% fio2/ 10 peep. Foul smelling et tube secretions +. Agitated and needed diprivan last night. Now doing ok on fent gtt  VITAL SIGNS: Temp:  [97.7 F (36.5  C)-100.6 F (38.1 C)] 98 F (36.7 C) (09/23 1232) Pulse Rate:  [86-125] 111 (09/23 1237) Resp:  [7-36] 36 (09/23 1237) BP: (83-138)/(49-79) 124/65 mmHg (09/23 1237) SpO2:  [91 %-100 %] 99 % (09/23 1237) FiO2 (%):  [40 %-70 %] 70 % (09/23 1243)  PHYSICAL EXAMINATION: General:  Intubated, Alert responds to commands, NAD Neuro:  RASS 0 to -2. CAM-ICU positive for delirium HEENT:  NCAT, WNL Neck:  Supple, CVL site clean Cardiovascular: RRR, no m/r/g Lungs: R>L rhonchi, no wheezes Abdomen:  Soft, Nontender, + BS  Ext:  No edema, warm   PULMONARY  Recent Labs Lab 01/26/13 0111 01/26/13 0632 01/26/13 0942 01/28/13 1233 01/29/13 0325  PHART 7.247* 7.284* 7.359 7.292* 7.303*  PCO2ART 42.3 40.2 34.4* 51.8* 51.3*  PO2ART 105.0* 58.0* 70.0* 73.0* 103.0*  HCO3 18.4* 19.0* 19.3* 25.1* 25.5*  TCO2 20 20 20 27 27   O2SAT 97.0 86.0 93.0 93.0 97.0    CBC  Recent Labs Lab 01/27/13 0400 01/28/13 0405 01/29/13 0400  HGB 7.6* 7.1* 6.1*  HCT 21.0* 19.5* 17.8*  WBC 34.5* 38.7* 34.5*  PLT 219 187 190    COAGULATION  Recent Labs Lab 01/23/13 1730  INR 1.67*    CARDIAC    Recent Labs Lab 01/23/13 0059 01/23/13 0800 01/23/13 1428 01/23/13 1734 01/25/13 0500  TROPONINI 0.44* 0.54* 0.41* 0.54* <0.30    Recent Labs Lab 01/23/13 0059 01/25/13 0500  PROBNP 2575.0* 4987.0*     CHEMISTRY  Recent Labs Lab 01/24/13 0500 01/25/13 0500 01/27/13 0400 01/28/13 0405 01/29/13 0400  NA 128* 137 134* 140 150*  K 4.0 4.0 3.1* 4.0 4.6  CL 100 108 102 108 117*  CO2 16* 19 21 24 25   GLUCOSE 123* 179* 157* 134* 123*  BUN 40* 36* 29* 35* 47*  CREATININE 0.96 0.76 0.64 0.66 0.87  CALCIUM 7.8* 8.1* 8.3* 8.6 8.1*  MG  --   --   --   --  2.0  PHOS  --   --   --   --  3.5   Estimated Creatinine Clearance: 76.8 ml/min (by C-G formula based on Cr of 0.87).   LIVER  Recent Labs Lab 01/23/13 0058 01/23/13 1730 01/28/13 0405  AST 17  --  16  ALT 6  --  7  ALKPHOS 117   --  101  BILITOT 0.5  --  0.3  PROT 8.7*  --  6.0  ALBUMIN 2.9*  --  1.6*  INR  --  1.67*  --      INFECTIOUS  Recent Labs Lab 01/23/13 0113 01/23/13 1730 01/24/13 0500 01/25/13 0500  LATICACIDVEN 3.55* 1.0  --   --   PROCALCITON  --  7.28 6.10 4.31     ENDOCRINE CBG (last 3)   Recent Labs  01/29/13 0330 01/29/13 0821 01/29/13 1214  GLUCAP 110* 131* 127*         IMAGING x48h  Dg Chest Port 1 View  01/29/2013   CLINICAL DATA:  Intubated patient.  EXAM: PORTABLE CHEST - 1 VIEW  COMPARISON:  Plain film of the chest 01/27/2013 and 01/28/2013.  FINDINGS: Left IJ catheter has advanced slightly with the tip now projecting over the superior vena cava. Dense airspace opacity in the right mid and upper lung zones is unchanged. Patchy bilateral airspace disease also persists although aeration in the right lung base appear somewhat improved. Heart size is normal. No pneumothorax is identified.  IMPRESSION: Some improvement in aeration of right lung base. The patient's examination is otherwise unchanged.   Electronically Signed   By: Drusilla Kanner M.D.   On: 01/29/2013 04:36   Dg Chest Port 1 View  01/28/2013   CLINICAL DATA:  Respiratory failure. Shortness of Breath.  EXAM: PORTABLE CHEST - 1 VIEW  COMPARISON:  Plain film of the chest 01/25/2013 and 01/27/2013. CT chest 01/24/2013.  FINDINGS: Support tubes and lines are unchanged. Extensive bilateral airspace disease, worst in the right upper lobe persists. There is increased airspace disease in the mid and lower lung zones bilaterally since yesterday's exam. No pneumothorax is identified.  IMPRESSION: Extensive bilateral airspace disease most confluent in the right upper lobe has worsened in the mid and lower lung zones bilaterally since yesterday's study.   Electronically Signed   By: Drusilla Kanner M.D.   On: 01/28/2013 03:52       PULMONARY  A: Acute respiratory failure due to RUL necrotizing PNA COPD   01/29/13:  70% fuio2 and 10 peep. Autopeep some better. PRVC waveform looks as good as it can get  P:  OPRVC Vent bundle in place Daily SBT as indicated Heading towards trach  CARDIOVASCULAR  A: Shock, septic, resolved Minimally elevated troponin I Elevated BNP Mild cardiomyopathy with WMAs - suspect ischemic Hypertension Sinus tachycardia  01/29/13: Not on pressors P:   Cont ASA Cont scheduled metoprolol Cont PRN metoprolol to maintain HR < 115 Cont PRN hydralazine to maintain SBP < 170 mmHg Will need to consider  further Cards eval after extubation  RENAL  A: Acute renal insufficiency, resolved  01/29/13: Na 150. Free water deficit 2.7L   P: Free water 300cc q6h via tube  Monitor BMET intermittently Correct electrolytes as indicated  GASTROINTESTINAL  A: Protein calorie malnutrition  P:  Continue TFs  Cont SUP   HEMATOLOGIC  A: Myelodysplastic syndrome  Anemia , chronic and critical illness- on aranesp as outpt    01/29/13: hgb 6.1 and s/p 1U PRBC  P:  DVT px: cont LMWH Cont aranesp per pharmacy  Cont Vit B12, Folic acid PRBC forhgb < 7gm%  INFECTIOUS  A: Septic shock - resolved 01/24/13 Necrotizing PNA, NOS  01/28/13: On Rx with unasyn  P:  Micro and abx as above Will likely need prolonged course of abx (treat as lung abscess)  ENDOCRINE  A: Hyperglycemia without prior hx of DM Chronic steroid use P:  Cont taper stress dose steroids Continue SSI  NEUROLOGIC  A: Seizure disorder  EtOH abuse Hx Deconditioning  01/28/13: Follows commands on WUA.  Failed precedex due to agitation. On fentanyl gtt with dipriovan prn  P:  Fent gtt +  intermittent sedation Cont phenytoin - changeback to IV forumation due to convenience and binding interaction on po route    GLOBAL 01/28/13: Daughter updated by PCCM MD . Spiritual care gave support 01/29/13: Wife nto bedside. Does not look he wil survive this illness. Need to do goals of care; will consult  palliative care   The patient is critically ill with multiple organ systems failure and requires high complexity decision making for assessment and support, frequent evaluation and titration of therapies, application of advanced monitoring technologies and extensive interpretation of multiple databases.   Critical Care Time devoted to patient care services described in this note is  35  Minutes.  Dr. Kalman Shan, M.D., [facility].C.P Pulmonary and Critical Care Medicine Staff Physician [place] [facility] [place] [facility] Pager: [phone], If no answer or between  15:00h - 7:00h: call [phone]  01/29/2013 1:01 PM

## 2013-01-30 ENCOUNTER — Telehealth: Payer: Self-pay | Admitting: Dietician

## 2013-01-30 LAB — TYPE AND SCREEN: ABO/RH(D): O POS

## 2013-01-30 LAB — APTT
aPTT: 61 seconds — ABNORMAL HIGH (ref 24–37)
aPTT: 69 seconds — ABNORMAL HIGH (ref 24–37)

## 2013-01-30 LAB — CBC
Hemoglobin: 7.3 g/dL — ABNORMAL LOW (ref 13.0–17.0)
MCH: 36.9 pg — ABNORMAL HIGH (ref 26.0–34.0)
MCHC: 34.3 g/dL (ref 30.0–36.0)
MCV: 107.6 fL — ABNORMAL HIGH (ref 78.0–100.0)
RDW: 25.9 % — ABNORMAL HIGH (ref 11.5–15.5)
WBC: 37.7 10*3/uL — ABNORMAL HIGH (ref 4.0–10.5)

## 2013-01-30 LAB — RETICULOCYTES
RBC.: 1.98 MIL/uL — ABNORMAL LOW (ref 4.22–5.81)
Retic Ct Pct: 0.5 % (ref 0.4–3.1)

## 2013-01-30 LAB — CBC WITH DIFFERENTIAL/PLATELET
Basophils Relative: 1 % (ref 0–1)
Eosinophils Relative: 0 % (ref 0–5)
HCT: 34.6 % — ABNORMAL LOW (ref 39.0–52.0)
Hemoglobin: 11.7 g/dL — ABNORMAL LOW (ref 13.0–17.0)
Lymphocytes Relative: 2 % — ABNORMAL LOW (ref 12–46)
Lymphs Abs: 0.5 10*3/uL — ABNORMAL LOW (ref 0.7–4.0)
MCH: 36.4 pg — ABNORMAL HIGH (ref 26.0–34.0)
MCV: 107.8 fL — ABNORMAL HIGH (ref 78.0–100.0)
Monocytes Absolute: 1.2 10*3/uL — ABNORMAL HIGH (ref 0.1–1.0)
Platelets: 128 10*3/uL — ABNORMAL LOW (ref 150–400)
RBC: 3.21 MIL/uL — ABNORMAL LOW (ref 4.22–5.81)

## 2013-01-30 LAB — BASIC METABOLIC PANEL
BUN: 51 mg/dL — ABNORMAL HIGH (ref 6–23)
CO2: 26 mEq/L (ref 19–32)
Chloride: 118 mEq/L — ABNORMAL HIGH (ref 96–112)
Creatinine, Ser: 0.85 mg/dL (ref 0.50–1.35)
GFR calc non Af Amer: >90 mL/min (ref >90)
Potassium: 4.6 mEq/L (ref 3.5–5.1)
Sodium: 153 mEq/L — ABNORMAL HIGH (ref 135–145)

## 2013-01-30 LAB — GLUCOSE, CAPILLARY
Glucose-Capillary: 138 mg/dL — ABNORMAL HIGH (ref 70–99)
Glucose-Capillary: 123 mg/dL — ABNORMAL HIGH (ref 70–99)

## 2013-01-30 LAB — PHOSPHORUS: Phosphorus: 3.9 mg/dL (ref 2.3–4.6)

## 2013-01-30 MED ORDER — FREE WATER
300.0000 mL | Status: DC
  Administered 2013-01-30 – 2013-02-06 (×40): 300 mL

## 2013-01-30 MED ORDER — VITAL AF 1.2 CAL PO LIQD
1000.0000 mL | ORAL | Status: DC
Start: 2013-01-30 — End: 2013-02-12
  Administered 2013-01-30 – 2013-02-12 (×9): 1000 mL
  Filled 2013-01-30 (×27): qty 1000

## 2013-01-30 MED ORDER — SODIUM CHLORIDE 0.9 % IJ SOLN
3.0000 mL | Freq: Two times a day (BID) | INTRAMUSCULAR | Status: DC
  Administered 2013-01-30: 3 mL via INTRAVENOUS

## 2013-01-30 MED ORDER — SODIUM CHLORIDE 0.9 % IJ SOLN: 3.0000 mL | INTRAMUSCULAR | Status: DC | PRN

## 2013-01-30 MED ORDER — SODIUM CHLORIDE 0.9 % IV SOLN: 250.0000 mL | INTRAVENOUS | Status: DC | PRN

## 2013-01-30 MED ORDER — ARGATROBAN 50 MG/50ML IV SOLN
0.5000 ug/kg/min | INTRAVENOUS | Status: DC
  Administered 2013-01-30 – 2013-02-01 (×4): 0.5 ug/kg/min via INTRAVENOUS
  Filled 2013-01-30 (×4): qty 50

## 2013-01-30 NOTE — Progress Notes (Signed)
ANTIBIOTIC CONSULT NOTE - FOLLOW UP  Pharmacy Consult:  Unasyn + Aranesp Indication:  PNA + anemia  No Known Allergies  Patient Measurements: Height: 5\' 7"  (170.2 cm) Weight: 139 lb 8.8 oz (63.3 kg) IBW/kg (Calculated) : 66.1  Vital Signs: Temp: 99.2 F (37.3 C) (09/24 0417) Temp src: Oral (09/24 0417) BP: 162/76 mmHg (09/24 0741) Pulse Rate: 114 (09/24 0741) Intake/Output from previous day: 09/23 0701 - 09/24 0700 In: 3579.4 [I.V.:745.4; NG/GT:2430; IV Piggyback:404] Out: 1980 [Urine:1980] Intake/Output from this shift: Total I/O In: 92.5 [I.V.:32.5; NG/GT:60] Out: -   Labs:  Recent Labs  01/28/13 0405 01/29/13 0400 01/30/13 01/30/13 0430  WBC 38.7* 34.5* 37.7* 23.0*  HGB 7.1* 6.1* 7.3* 11.7*  PLT 187 190 180 128*  CREATININE 0.66 0.87  --  0.85   Estimated Creatinine Clearance: 78.6 ml/min (by C-G formula based on Cr of 0.85). No results found for this basename: VANCOTROUGH, Leodis Binet, VANCORANDOM, GENTTROUGH, GENTPEAK, GENTRANDOM, TOBRATROUGH, TOBRAPEAK, TOBRARND, AMIKACINPEAK, AMIKACINTROU, AMIKACIN,  in the last 72 hours   Microbiology: Recent Results (from the past 720 hour(s))  MRSA PCR SCREENING     Status: None   Collection Time    01/23/13  4:37 AM      Result Value Range Status   MRSA by PCR NEGATIVE  NEGATIVE Final   Comment:            The GeneXpert MRSA Assay (FDA     approved for NASAL specimens     only), is one component of a     comprehensive MRSA colonization     surveillance program. It is not     intended to diagnose MRSA     infection nor to guide or     monitor treatment for     MRSA infections.  CULTURE, BLOOD (ROUTINE X 2)     Status: None   Collection Time    01/23/13  8:00 AM      Result Value Range Status   Specimen Description BLOOD LEFT ARM   Final   Special Requests BOTTLES DRAWN AEROBIC ONLY 5CC   Final   Culture  Setup Time     Final   Value: 01/23/2013 13:06     Performed at [facility]   Culture     Final    Value: NO GROWTH 5 DAYS     Performed at [facility]   Report Status 01/29/2013 FINAL   Final  CULTURE, BLOOD (ROUTINE X 2)     Status: None   Collection Time    01/23/13  8:05 AM      Result Value Range Status   Specimen Description BLOOD LEFT HAND   Final   Special Requests BOTTLES DRAWN AEROBIC ONLY 4CC   Final   Culture  Setup Time     Final   Value: 01/23/2013 13:06     Performed at [facility]   Culture     Final   Value: NO GROWTH 5 DAYS     Performed at [facility]   Report Status 01/29/2013 FINAL   Final  MRSA PCR SCREENING     Status: None   Collection Time    01/23/13  3:25 PM      Result Value Range Status   MRSA by PCR NEGATIVE  NEGATIVE Final   Comment:            The GeneXpert MRSA Assay (FDA     approved for NASAL specimens  only), is one component of a     comprehensive MRSA colonization     surveillance program. It is not     intended to diagnose MRSA     infection nor to guide or     monitor treatment for     MRSA infections.  URINE CULTURE     Status: None   Collection Time    01/23/13  5:59 PM      Result Value Range Status   Specimen Description URINE, CATHETERIZED   Final   Special Requests NONE   Final   Culture  Setup Time     Final   Value: 01/23/2013 18:55     Performed at [facility] Count     Final   Value: NO GROWTH     Performed at [facility]   Culture     Final   Value: NO GROWTH     Performed at [facility]   Report Status 01/25/2013 FINAL   Final  CULTURE, RESPIRATORY (NON-EXPECTORATED)     Status: None   Collection Time    01/24/13  1:51 AM      Result Value Range Status   Specimen Description TRACHEAL ASPIRATE   Final   Special Requests NONE   Final   Gram Stain     Final   Value: MODERATE WBC PRESENT, PREDOMINANTLY PMN     RARE SQUAMOUS EPITHELIAL CELLS PRESENT     FEW GRAM POSITIVE COCCI     IN PAIRS     Performed at [facility]   Culture      Final   Value: Non-Pathogenic Oropharyngeal-type Flora Isolated.     Performed at [facility]   Report Status 01/26/2013 FINAL   Final      Assessment: 72 YOM admitted 01/23/2013 with worsening SOB for over a week, productive cough, chills, fever, and noted to have large RUL PNA.  Patient continues on Unasyn.  His renal function remains stable.  Unasyn 9/21>> Vanco 9/18>>9/21 --9/20: VT 13. Dose increased Cefepime 9/18>>9/21 Azith 9/17 >>9/20 CTX 9/17 >>9/18  9/17 Resp>> normal flora 9/17 Urine>> NEG 9/17 blood cx - ngtd 9/17 MRSA PCR-NEG   Pharmacy also monitoring Aranesp for megaloblastic anemia.  His hemoglobin is trending up and he has received one dose of Aranesp.  Patient's iron level was low and is currently on PO iron supplementation.  Retic count low and trending down.   Goal of Therapy:  Clearance of infection Hgb > 11 mg/dL   Plan:  - Continue Unasyn 1.5gm IV Q6H - Continue Aranesp SQ q-Wed up to 4 doses - Monitor renal fxn, clinical course - F/U hgb and d/c Aranesp on hgb sustainable (hgb >/= 11 g/dL) - ?F/U hold Lovenox    Mercy Malena D. Laney Potash, PharmD, BCPS Pager:  [phone] 01/30/2013, 11:02 AM

## 2013-01-30 NOTE — Progress Notes (Signed)
Attempted to wean fio2 to 50%, pt desat to 88%.  Increased fio2 to 60% again, sats climbing=91%

## 2013-01-30 NOTE — Progress Notes (Addendum)
PULMONARY  / CRITICAL CARE MEDICINE  Name: Adrian [name] MRN: [id number] DOB: [birth year]  PCP [name], MD    ADMISSION DATE: 01/23/2013  CONSULTATION DATE: 01/25/2013  LOS 7 days   REFERRING MD : Serita Kyle, MD  PRIMARY SERVICE:  PCCM   CHIEF COMPLAINT: No complaints  BRIEF PATIENT DESCRIPTION: 62 y/o male with PMH ETOH, COPD, anemia, myelodysplastic syndrome (gets iron infusion at cancer center) admitted 9/17 with large RUL PNA. Decompensated on BiPAP. PCCM was consulted 9/17 for intubation and continued care in ICU.   LINES / TUBES:  R rad a-line 9/17 >> 9/20 ETT 9/17 >>  L IJ CVL9/17 >>   CULTURES:  MRSA PCR 9/17>>> NEG Strep Ag 9/17>>> NEG  Legionella Ag 9/17 >> NEG Urine 9/17 >>  NEG Resp 9/17 >> NOF Blood 9/17 >>       ANTIBIOTICS:  Ceftriaxone 9/17 >>> 9/18 Azithromycin 9/17 >> 9/20 Vanc 9/18 >> 9/21 Cefepime 9/18  >> 9/21 Unsayn 9/21 >>   SIGNIFICANT EVENTS / STUDIES:  9/17 admitted by [facility] with dx of PNA. BiPAP initiated 9/17 Progressive resp failure. PCCM consulted. intubated   9/17 ECHO:  Mild LVH, EF 45-50%, grade 1 diastolic dysfunction, Septal dyssynergy. Akinesis base inferior wall. Severe hypokinesis inferolateral wall 9/18 CT w/o Contrast: Dense consolidation consistent  necrotizing PNA in RUL, interstitial PNA throughout R and L lower lobes, increase IS and AS dz in RLL 9/19 RASS 0. + F/C. Still requiring PEEP 8 and FiO2 50% 9/20 Change to PCV mode on vent to enhance comfort and synchrony  01/27/13:  Intubated. RASS 0. + F/C. Purulent ET secretions persist  01/28/13: On 70% fio2, peep 8, RR 18, PCV  and has significnat air trapping and vent dysonchrony despite sedation. RN reporting foul smelling secretions   01/29/13:  No change. 70% fio2/ 10 peep. Foul smelling et tube secretions +. Agitated and needed diprivan last night. Now doing ok on fent gtt   SUBJECTIVE/OVERNIGHT/INTERVAL HX   01/30/13: Wife at bedside thnks he is a  bit better. On 60% fio2/10 peep. WUA results in agitation and autioppe. On sedation he is calm and oriented  VITAL SIGNS: Temp:  [98 F (36.7 C)-99.2 F (37.3 C)] 98.6 F (37 C) (09/24 0838) Pulse Rate:  [101-126] 114 (09/24 0900) Resp:  [0-36] 33 (09/24 0900) BP: (110-162)/(60-80) 145/64 mmHg (09/24 0900) SpO2:  [94 %-100 %] 96 % (09/24 0900) FiO2 (%):  [60 %-70 %] 60 % (09/24 0741)  PHYSICAL EXAMINATION: General:  Intubated, Alert responds to commands, NAD. Sitting in chair Neuro:  RASS 0 to -2. CAM-ICU neg for delirium on sedation gtt HEENT:  NCAT, WNL Neck:  Supple, CVL site clean Cardiovascular: RRR, no m/r/g Lungs: R>L rhonchi, no wheezes Abdomen:  Soft, Nontender, + BS  Ext:  No edema, warm   PULMONARY  Recent Labs Lab 01/26/13 0111 01/26/13 0632 01/26/13 0942 01/28/13 1233 01/29/13 0325  PHART 7.247* 7.284* 7.359 7.292* 7.303*  PCO2ART 42.3 40.2 34.4* 51.8* 51.3*  PO2ART 105.0* 58.0* 70.0* 73.0* 103.0*  HCO3 18.4* 19.0* 19.3* 25.1* 25.5*  TCO2 20 20 20 27 27   O2SAT 97.0 86.0 93.0 93.0 97.0    CBC  Recent Labs Lab 01/29/13 0400 01/30/13 01/30/13 0430  HGB 6.1* 7.3* 11.7*  HCT 17.8* 21.3* 34.6*  WBC 34.5* 37.7* 23.0*  PLT 190 180 128*    COAGULATION  Recent Labs Lab 01/23/13 1730  INR 1.67*    CARDIAC    Recent Labs Lab 01/23/13 1428  01/23/13 1734 01/25/13 0500  TROPONINI 0.41* 0.54* <0.30    Recent Labs Lab 01/25/13 0500  PROBNP 4987.0*     CHEMISTRY  Recent Labs Lab 01/25/13 0500 01/27/13 0400 01/28/13 0405 01/29/13 0400 01/30/13 0430  NA 137 134* 140 150* 153*  K 4.0 3.1* 4.0 4.6 4.6  CL 108 102 108 117* 118*  CO2 19 21 24 25 26   GLUCOSE 179* 157* 134* 123* 135*  BUN 36* 29* 35* 47* 51*  CREATININE 0.76 0.64 0.66 0.87 0.85  CALCIUM 8.1* 8.3* 8.6 8.1* 8.3*  MG  --   --   --  2.0 2.1  PHOS  --   --   --  3.5 3.9   Estimated Creatinine Clearance: 78.6 ml/min (by C-G formula based on Cr of  0.85).   LIVER  Recent Labs Lab 01/23/13 1730 01/28/13 0405  AST  --  16  ALT  --  7  ALKPHOS  --  101  BILITOT  --  0.3  PROT  --  6.0  ALBUMIN  --  1.6*  INR 1.67*  --      INFECTIOUS  Recent Labs Lab 01/23/13 1730 01/24/13 0500 01/25/13 0500  LATICACIDVEN 1.0  --   --   PROCALCITON 7.28 6.10 4.31     ENDOCRINE CBG (last 3)   Recent Labs  01/29/13 2345 01/30/13 0433 01/30/13 0832  GLUCAP 145* 130* 138*         IMAGING x48h  Dg Chest Port 1 View  01/29/2013   CLINICAL DATA:  Intubated patient.  EXAM: PORTABLE CHEST - 1 VIEW  COMPARISON:  Plain film of the chest 01/27/2013 and 01/28/2013.  FINDINGS: Left IJ catheter has advanced slightly with the tip now projecting over the superior vena cava. Dense airspace opacity in the right mid and upper lung zones is unchanged. Patchy bilateral airspace disease also persists although aeration in the right lung base appear somewhat improved. Heart size is normal. No pneumothorax is identified.  IMPRESSION: Some improvement in aeration of right lung base. The patient's examination is otherwise unchanged.   Electronically Signed   By: Drusilla Kanner M.D.   On: 01/29/2013 04:36       PULMONARY  A: Acute respiratory failure due to RUL necrotizing PNA COPD   9/243/14: 60% fuio2 and 10 peep. Autopeep some better. PRVC waveform looks as good as it can get but this is on sedatin gtt  P:  PRVC Vent bundle in place Daily SBT as indicated Heading towards trach  CARDIOVASCULAR  A: Shock, septic, resolved Minimally elevated troponin I Elevated BNP Mild cardiomyopathy with WMAs - suspect ischemic Hypertension Sinus tachycardia  01/30/13: Not on pressors P:   Cont ASA Cont scheduled metoprolol Cont PRN metoprolol to maintain HR < 115 Cont PRN hydralazine to maintain SBP < 170 mmHg Will need to consider further Cards eval after extubation  RENAL  A: Acute renal insufficiency, resolved  01/30/13: Na 153.  Free water deficit 3L despite free water yesterday  P: Free water 300cc q4 via tube  Monitor BMET intermittently Correct electrolytes as indicated  GASTROINTESTINAL  A: Protein calorie malnutrition  P:  Continue TFs  Cont SUP   HEMATOLOGIC  A: Myelodysplastic syndrome  Anemia , chronic and critical illness- on aranesp as outpt Thrombocytopenia 01/30/13 - 50% drop on day 6     01/30/13: hgb 11.7 and s/p 1U PRBC  P:  DVT px: Change  LMWH to SCD. Start SCD. Check Duplex. Check HIT. START  DTI Cont aranesp per pharmacy  Cont Vit B12, Folic acid PRBC forhgb < 7gm%  INFECTIOUS  A: Septic shock - resolved 01/24/13 Necrotizing PNA, NOS  01/30/13: On Rx with unasyn  P:  Micro and abx as above Will likely need prolonged course of abx (treat as lung abscess)  ENDOCRINE  A: Hyperglycemia without prior hx of DM Chronic steroid use P:  Cont taper stress dose steroids Continue SSI  NEUROLOGIC  A: Seizure disorder  EtOH abuse Hx Deconditioning  01/30/13: Follows commands on WUA. On fentanyl gtt with versed prn  P:  Fent gtt +  intermittent sedation Cont phenytoin - changeback to IV forumation due to convenience and binding interaction on po route    GLOBAL 01/28/13: Daughter updated by PCCM MD . Spiritual care gave support 01/29/13: Wife nto bedside. Does not look he wil survive this illness. Need to do goals of care; will consult palliative care 01/30/13: Wife updated/ She wil meet with palliative care. She had expresssed emotional distress to chaplain. To me I amnot sure she grasps how ill he is.    The patient is critically ill with multiple organ systems failure and requires high complexity decision making for assessment and support, frequent evaluation and titration of therapies, application of advanced monitoring technologies and extensive interpretation of multiple databases.   Critical Care Time devoted to patient care services described in this note is  35   Minutes.  Dr. Kalman Shan, M.D., [facility].C.P Pulmonary and Critical Care Medicine Staff Physician [facility] [place] [facility] Pager: [phone], If no answer or between  15:00h - 7:00h: call [phone]  01/30/2013 11:00 AM

## 2013-01-30 NOTE — Progress Notes (Signed)
ANTICOAGULATION CONSULT NOTE - Initial Consult  Pharmacy Consult:  Argatroban Indication:  Suspected HIT  No Known Allergies  Patient Measurements: Height: 5\' 7"  (170.2 cm) Weight: 139 lb 8.8 oz (63.3 kg) IBW/kg (Calculated) : 66.1  Vital Signs: Temp: 98.9 F (37.2 C) (09/24 1155) Temp src: Oral (09/24 1155) BP: 145/64 mmHg (09/24 0900) Pulse Rate: 114 (09/24 0900)  Labs:  Recent Labs  01/28/13 0405 01/29/13 0400 01/30/13 01/30/13 0430  HGB 7.1* 6.1* 7.3* 11.7*  HCT 19.5* 17.8* 21.3* 34.6*  PLT 187 190 180 128*  CREATININE 0.66 0.87  --  0.85    Estimated Creatinine Clearance: 78.6 ml/min (by C-G formula based on Cr of 0.85).   Medical History: Past Medical History  Diagnosis Date  . Seizures   . Megaloblastic anemia   . Emphysema of lung       Assessment: 62 YOM to start argatroban for suspected HIT.  4T score is 5, putting him in the moderate risk category and MD opted to start DTI until HIT panel results return.  No bleeding reported.   Goal of Therapy:  aPTT 50-90 seconds Monitor platelets by anticoagulation protocol: Yes Hgb > 11 g/dL Clearance of infection    Plan:  - D/C Lovenox - Argatroban 0.5 mcg/kg/min - Check aPTT 2 hrs post med initiation - Continue Unasyn 1.5gm IV Q6H - Continue Aranesp SQ q-Wed up to 4 doses - Monitor renal fxn, clinical course - F/U hgb and d/c Aranesp on hgb sustainable (hgb >/= 11 g/dL)   Autie Vasudevan D. Laney Potash, PharmD, BCPS Pager:  [phone] 01/30/2013, 12:01 PM

## 2013-01-30 NOTE — Progress Notes (Signed)
ANTICOAGULATION CONSULT NOTE - Follow Up Consult  Pharmacy Consult for Argatroban Indication: Suspected HIT  Allergies  Allergen Reactions  . Heparin Other (See Comments)    HIT     Patient Measurements: Height: 5\' 7"  (170.2 cm) Weight: 139 lb 8.8 oz (63.3 kg) IBW/kg (Calculated) : 66.1  Vital Signs: Temp: 98.9 F (37.2 C) (09/24 2000) Temp src: Oral (09/24 2000) BP: 142/64 mmHg (09/24 2000) Pulse Rate: 99 (09/24 2000)  Labs:  Recent Labs  01/28/13 0405 01/29/13 0400 01/30/13 01/30/13 0430 01/30/13 1609 01/30/13 1900  HGB 7.1* 6.1* 7.3* 11.7*  --   --   HCT 19.5* 17.8* 21.3* 34.6*  --   --   PLT 187 190 180 128*  --   --   APTT  --   --   --   --  61* 69*  CREATININE 0.66 0.87  --  0.85  --   --     Estimated Creatinine Clearance: 78.6 ml/min (by C-G formula based on Cr of 0.85).   Medications:  Argatroban 0.5 mcg/kg/min  Assessment: 62yom started on Argatroban for suspected HIT. Initial and repeat aPTT are both therapeutic (0.61, 0.69 respectively) - will continue current rate and follow-up AM aPTT. - No significant bleeding reported  Goal of Therapy:  aPTT 50-90 seconds Monitor platelets by anticoagulation protocol: Yes   Plan:  1. Continue Argatroban 0.83mcg/kg/min 2. Follow-up AM aPTT and CBC  Cleon Dew [phone] 01/30/2013,9:01 PM

## 2013-01-30 NOTE — Progress Notes (Signed)
Pt was weaned from sedation this morning, pt became increasingly SOB, tachypnic, had BM.  RN resedated pt, pt appears more comfortable.

## 2013-01-30 NOTE — Progress Notes (Signed)
PT Cancellation Note  Patient Details Name: Adrian [name] MRN: [id number] DOB: Feb 15, 1951   Cancelled Treatment:    Reason Eval/Treat Not Completed: Medical issues which prohibited therapy (pt remains with FiO2 60% and peep 8) Pt not medically ready, will sign off, request new consult when appropriate for therapies.   Fabio Asa 01/30/2013, 11:28 AM

## 2013-01-30 NOTE — Progress Notes (Signed)
NUTRITION FOLLOW UP / CONSULT  DOCUMENTATION CODES  Per approved criteria   -Severe malnutrition in the context of chronic illness    Intervention:    Increase Vital AF 1.2 to 70 ml/h to provide 2016 kcals, 126 gm protein, 1362 ml free water daily.  Nutrition Dx:   Malnutrition related to poor PO intake, SOB as evidenced by physical exam. Ongoing.  Goal:   Intake to meet >90% of estimated nutrition needs, met.   Monitor:   TF tolerance/adequacy, weight trend, labs, vent status.  Assessment:   Patient admitted with shortness of breath. Pt with PMHx of COPD, MDS, and renal insufficiency. Pt found to have sepsis r/t to pneumonia and NSTEMI. Transferred to ICU 9/17 afternoon with respiratory decompensation requiring intubation. Patient is severely malnourished.  TF was initiated 9/17. Electrolytes are now WNL.   Patient is currently intubated on ventilator support.  MV: 10.6 L/min Temp:Temp (24hrs), Avg:99 F (37.2 C), Min:98 F (36.7 C), Max:100.4 F (38 C)  Propofol: none  Pt sitting up in chair. Palliative care consult pending.  Patient has OG tube in place. Vital AF 1.2 is infusing @ 60 ml/hr.   Free water flushes: 300 ml every 4 hrs providing 1800 of free water.  Total free water: 2968 ml per day, due to free water deficit  Residuals: 0   Height: Ht Readings from Last 1 Encounters:  01/23/13 5\' 7"  (1.702 m)    Weight Status:  Trending up with fluids Wt Readings from Last 1 Encounters:  01/28/13 139 lb 8.8 oz (63.3 kg)  01/23/13  116 lb 13.5 oz (53 kg)   Re-estimated needs:  Kcal: 2000 Protein: 85-105 gm Fluid: 1.8-2 L  Skin: no problems  Diet Order:    TF Order: Vital AF 1.2 at 40 ml/h providing 1152 kcals, 72 gm protein, 779 ml free water daily.   Intake/Output Summary (Last 24 hours) at 01/30/13 0904 Last data filed at 01/30/13 0800  Gross per 24 hour  Intake 3461.7 ml  Output   1790 ml  Net 1671.7 ml    Last BM: 9/20  Labs:   Recent  Labs Lab 01/28/13 0405 01/29/13 0400 01/30/13 0430  NA 140 150* 153*  K 4.0 4.6 4.6  CL 108 117* 118*  CO2 24 25 26   BUN 35* 47* 51*  CREATININE 0.66 0.87 0.85  CALCIUM 8.6 8.1* 8.3*  MG  --  2.0 2.1  PHOS  --  3.5 3.9  GLUCOSE 134* 123* 135*    CBG (last 3)   Recent Labs  01/29/13 2345 01/30/13 0433 01/30/13 0832  GLUCAP 145* 130* 138*    Scheduled Meds: . ampicillin-sulbactam (UNASYN) IV  1.5 g Intravenous Q6H  . antiseptic oral rinse  15 mL Mouth Rinse QID  . budesonide (PULMICORT) nebulizer solution  0.25 mg Nebulization Q6H  . chlorhexidine  15 mL Mouth Rinse BID  . darbepoetin (ARANESP) injection - NON-DIALYSIS  100 mcg Subcutaneous Q Wed-1800  . enoxaparin (LOVENOX) injection  40 mg Subcutaneous Q24H  . ferrous fumarate-b12-vitamic C-folic acid  1 capsule Oral Daily  . folic acid  1 mg Per Tube Daily  . free water  300 mL Per Tube Q6H  . insulin aspart  0-15 Units Subcutaneous Q4H  . ipratropium  0.5 mg Nebulization Q6H  . levalbuterol  0.63 mg Nebulization Q6H  . metoprolol tartrate  25 mg Per Tube BID  . pantoprazole sodium  40 mg Per Tube Q1200  . phenytoin (DILANTIN) IV  200 mg Intravenous Q12H  . sodium chloride  3 mL Intravenous Q12H    Continuous Infusions: . sodium chloride 20 mL/hr at 01/30/13 0745  . feeding supplement (VITAL AF 1.2 CAL) 1,000 mL (01/30/13 0500)  . fentaNYL infusion INTRAVENOUS 50 mcg/hr (01/30/13 0815)  . propofol Stopped (01/29/13 1200)    Kendell Bane RD, LDN, CNSC [phone] Pager [phone] After Hours Pager

## 2013-01-30 NOTE — Progress Notes (Signed)
PMT follow-up - spoke with Ms Turrell outside of patient's room to follow-up on request for goals of care- Ms Cure wants her daughter to be able to be present for discussion- several meetign times offered through-out the day Thursday and Friday however, do to work schedules of wife and daughter and first available time for PMT provider meeting scheduled for Friday 02/01/13 @ 6:00 pm - wife will call PMT phone should she and her daughter be able to meet any earlier   Valente David, RN 01/30/2013, 12:31 PM Palliative Medicine Team RN Liaison [phone]

## 2013-01-30 NOTE — Plan of Care (Signed)
Problem: Phase I Progression Outcomes Goal: Voiding-avoid urinary catheter unless indicated Outcome: Not Met (add Reason) Pt has foley     

## 2013-01-30 NOTE — Progress Notes (Signed)
Chaplain received referral from physician during unit rounds. Physician has requested consult with patient's wife and palliative care and will page chaplain to be present during this meeting. Chaplain visited with wife and patient. Patient was smiling and greeted this chaplain with familiarity. Patient's wife, Steward Drone, feels that her husband is "much better." Patient's minister has visited. Chaplain offered active listening, compassionate presence, and spiritual and emotional support. Chaplain will remain in contact with physician for palliative care discussion, and triage with palliative care chaplain, and continued spiritual support for patient and Steward Drone as needed.  Maurene Capes, Chaplain

## 2013-01-31 ENCOUNTER — Inpatient Hospital Stay (HOSPITAL_COMMUNITY): Payer: BC Managed Care – PPO

## 2013-01-31 DIAGNOSIS — R609 Edema, unspecified: Secondary | ICD-10-CM

## 2013-01-31 LAB — GLUCOSE, CAPILLARY
Glucose-Capillary: 123 mg/dL — ABNORMAL HIGH (ref 70–99)
Glucose-Capillary: 149 mg/dL — ABNORMAL HIGH (ref 70–99)

## 2013-01-31 LAB — CBC
HCT: 21.8 % — ABNORMAL LOW (ref 39.0–52.0)
Hemoglobin: 7.3 g/dL — ABNORMAL LOW (ref 13.0–17.0)
MCH: 36.1 pg — ABNORMAL HIGH (ref 26.0–34.0)
MCHC: 33.5 g/dL (ref 30.0–36.0)
Platelets: 152 10*3/uL (ref 150–400)
RDW: 24.1 % — ABNORMAL HIGH (ref 11.5–15.5)
WBC: 38 10*3/uL — ABNORMAL HIGH (ref 4.0–10.5)

## 2013-01-31 LAB — BASIC METABOLIC PANEL
BUN: 46 mg/dL — ABNORMAL HIGH (ref 6–23)
Calcium: 8.3 mg/dL — ABNORMAL LOW (ref 8.4–10.5)
Chloride: 112 mEq/L (ref 96–112)
GFR calc non Af Amer: >90 mL/min (ref >90)
Glucose, Bld: 139 mg/dL — ABNORMAL HIGH (ref 70–99)
Potassium: 4.2 mEq/L (ref 3.5–5.1)

## 2013-01-31 LAB — CBC WITH DIFFERENTIAL/PLATELET
Basophils Relative: 0 % (ref 0–1)
Eosinophils Absolute: 0 10*3/uL (ref 0.0–0.7)
HCT: 20.9 % — ABNORMAL LOW (ref 39.0–52.0)
Hemoglobin: 7.1 g/dL — ABNORMAL LOW (ref 13.0–17.0)
Lymphocytes Relative: 2 % — ABNORMAL LOW (ref 12–46)
MCH: 36.4 pg — ABNORMAL HIGH (ref 26.0–34.0)
MCHC: 34 g/dL (ref 30.0–36.0)
MCV: 107.2 fL — ABNORMAL HIGH (ref 78.0–100.0)
Monocytes Relative: 4 % (ref 3–12)
Neutro Abs: 33.7 10*3/uL — ABNORMAL HIGH (ref 1.7–7.7)
RBC: 1.95 MIL/uL — ABNORMAL LOW (ref 4.22–5.81)
WBC: 35.8 10*3/uL — ABNORMAL HIGH (ref 4.0–10.5)

## 2013-01-31 LAB — CLOSTRIDIUM DIFFICILE BY PCR: Toxigenic C. Difficile by PCR: NEGATIVE

## 2013-01-31 MED ORDER — FUROSEMIDE 10 MG/ML IJ SOLN
40.0000 mg | Freq: Once | INTRAMUSCULAR | Status: AC
  Administered 2013-01-31: 40 mg via INTRAVENOUS
  Filled 2013-01-31: qty 4

## 2013-01-31 MED ORDER — POTASSIUM CHLORIDE 20 MEQ/15ML (10%) PO LIQD
40.0000 meq | Freq: Once | ORAL | Status: AC
  Administered 2013-01-31: 40 meq
  Filled 2013-01-31: qty 30

## 2013-01-31 MED ORDER — MIDAZOLAM HCL 2 MG/2ML IJ SOLN
1.0000 mg | INTRAMUSCULAR | Status: DC | PRN
  Administered 2013-01-31 – 2013-02-01 (×3): 1 mg via INTRAVENOUS
  Administered 2013-02-02: 2 mg via INTRAVENOUS
  Administered 2013-02-04 – 2013-02-10 (×6): 1 mg via INTRAVENOUS
  Filled 2013-01-31 (×10): qty 2

## 2013-01-31 MED ORDER — POTASSIUM CHLORIDE 20 MEQ/15ML (10%) PO LIQD
ORAL | Status: AC
  Filled 2013-01-31: qty 30

## 2013-01-31 NOTE — Evaluation (Signed)
Physical Therapy Evaluation Patient Details Name: ESTER MABE MRN: [id number] DOB: Jul 04, 1950 Today's Date: 01/31/2013 Time: 0865-7846 PT Time Calculation (min): 26 min  PT Assessment / Plan / Recommendation History of Present Illness   62 y/o male with PMH ETOH, COPD, anemia, myelodysplastic syndrome (gets iron infusion at cancer center) admitted 9/17 with large RUL PNA. Decompensated on BiPAP. PCCM was consulted 9/17 for intubation and continued care in ICU.   Clinical Impression  Patient demonstrates deficits in functional mobility as indicated below. Pt will benefit from continued skilled PT to address deficits and maximize independence. Will continue to see as indicated. Patient may need continued services with ST SNF stay upon discharge. Will continue to reassess.    PT Assessment  Patient needs continued PT services    Follow Up Recommendations   (TBD)       Barriers to Discharge  (tbd)      Equipment Recommendations   (TBD)    Recommendations for Other Services  (TBD)   Frequency Min 3X/week    Precautions / Restrictions Restrictions Weight Bearing Restrictions: No   Pertinent Vitals/Pain VSS on Ventilator 50% continuous 15 L/min      Mobility  Bed Mobility Bed Mobility: Supine to Sit;Sitting - Scoot to Edge of Bed Supine to Sit: 1: +2 Total assist Supine to Sit: Patient Percentage: 30% Sitting - Scoot to Edge of Bed: 1: +2 Total assist Sitting - Scoot to Edge of Bed: Patient Percentage: 20% Details for Bed Mobility Assistance: patient able to initiate BLE movement to EOB, assist provided for trunk support and elvation/rotation Transfers Transfers: Sit to Stand;Stand to Sit;Stand Pivot Transfers Sit to Stand: 1: +2 Total assist;From elevated surface Sit to Stand: Patient Percentage: 10% Stand to Sit: 1: +2 Total assist;To chair/3-in-1 Stand to Sit: Patient Percentage: 10% Stand Pivot Transfers: 1: +2 Total assist;From elevated surface Stand Pivot  Transfers: Patient Percentage: 10% Details for Transfer Assistance: Patient max assist transfer to chair, able to bear some weight through LEs but not enough to assist with transfer. +3 for management of ventilator Ambulation/Gait Ambulation/Gait Assistance: Not tested (comment)        PT Diagnosis: Difficulty walking;Generalized weakness  PT Problem List: Decreased strength;Decreased range of motion;Decreased activity tolerance;Decreased balance;Decreased mobility;Decreased coordination;Decreased safety awareness;Cardiopulmonary status limiting activity PT Treatment Interventions: DME instruction;Gait training;Functional mobility training;Therapeutic activities;Therapeutic exercise;Balance training;Patient/family education     PT Goals(Current goals can be found in the care plan section) Acute Rehab PT Goals Patient Stated Goal: none stated PT Goal Formulation: Patient unable to participate in goal setting Time For Goal Achievement: 02/14/13 Potential to Achieve Goals: Fair  Visit Information  Last PT Received On: 01/31/13 Assistance Needed: +2 History of Present Illness:  62 y/o male with PMH ETOH, COPD, anemia, myelodysplastic syndrome (gets iron infusion at cancer center) admitted 9/17 with large RUL PNA. Decompensated on BiPAP. PCCM was consulted 9/17 for intubation and continued care in ICU.        Prior Functioning  Home Living Family/patient expects to be discharged to:: Skilled nursing facility    Cognition  Cognition Arousal/Alertness: Awake/alert Overall Cognitive Status: Difficult to assess Difficult to assess due to: Intubated    Extremity/Trunk Assessment Upper Extremity Assessment Upper Extremity Assessment: Defer to OT evaluation;Generalized weakness Lower Extremity Assessment Lower Extremity Assessment: Generalized weakness (grossly <3/5)   Balance Balance Balance Assessed: Yes Static Sitting Balance Static Sitting - Balance Support: Feet supported Static  Sitting - Level of Assistance: 3: Mod assist;2: Max assist Static Sitting -  Comment/# of Minutes: Sitting EOB with therapist providing support  End of Session PT - End of Session Equipment Utilized During Treatment: Gait belt;Oxygen (ventilator) Activity Tolerance: Patient tolerated treatment well (Nsg and RT at bedside during transfer session) Patient left: in chair;with call bell/phone within reach;with nursing/sitter in room (nsg and RT in room during session) Nurse Communication: Mobility status;Need for lift equipment  GP     Fabio Asa 01/31/2013, 4:53 PM Charlotte Crumb, PT DPT  [phone]

## 2013-01-31 NOTE — Progress Notes (Addendum)
PULMONARY  / CRITICAL CARE MEDICINE  Name: Adrian [name] MRN: [id number] DOB: 1951/01/23  PCP [name], MD    ADMISSION DATE: 01/23/2013  CONSULTATION DATE: 01/25/2013  LOS 8 days   REFERRING MD : Serita Kyle, MD  PRIMARY SERVICE:  PCCM   CHIEF COMPLAINT: No complaints  BRIEF PATIENT DESCRIPTION: 62 y/o male with PMH ETOH, COPD, anemia, myelodysplastic syndrome (gets iron infusion at cancer center) admitted 9/17 with large RUL PNA. Decompensated on BiPAP. PCCM was consulted 9/17 for intubation and continued care in ICU.   LINES / TUBES:  R rad a-line 9/17 >> 9/20 ETT 9/17 >>  L IJ CVL9/17 >>   CULTURES:  MRSA PCR 9/17>>> NEG Strep Ag 9/17>>> NEG  Legionella Ag 9/17 >> NEG Urine 9/17 >>  NEG Resp 9/17 >> NOF Blood 9/17 >> neg   ANTIBIOTICS:  Ceftriaxone 9/17 >>> 9/18 Azithromycin 9/17 >> 9/20 Vanc 9/18 >> 9/21 Cefepime 9/18  >> 9/21 Unsayn 9/21 >>   SIGNIFICANT EVENTS / STUDIES:  9/17 admitted by [facility] with dx of PNA. BiPAP initiated 9/17 Progressive resp failure. PCCM consulted. intubated   9/17 ECHO:  Mild LVH, EF 45-50%, grade 1 diastolic dysfunction, Septal dyssynergy. Akinesis base inferior wall. Severe hypokinesis inferolateral wall 9/18 CT w/o Contrast: Dense consolidation consistent  necrotizing PNA in RUL, interstitial PNA throughout R and L lower lobes, increase IS and AS dz in RLL 9/19 RASS 0. + F/C. Still requiring PEEP 8 and FiO2 50% 9/20 Change to PCV mode on vent to enhance comfort and synchrony 01/27/13:  Intubated. RASS 0. + F/C. Purulent ET secretions persist 01/28/13: On 70% fio2, peep 8, RR 18, PCV  and has significnat air trapping and vent dysonchrony despite sedation. RN reporting foul smelling secretions 01/29/13:  No change. 70% fio2/ 10 peep. Foul smelling et tube secretions +. Agitated and needed diprivan last night. Now doing ok on fent gtt   SUBJECTIVE/OVERNIGHT/INTERVAL HX 01/31/13: Improved to 50% fio2/peep 8. Agitated on WUA.  Palliative care for goals of care at 6pm 02/01/13  VITAL SIGNS: Temp:  [98.5 F (36.9 C)-99.1 F (37.3 C)] 99.1 F (37.3 C) (09/25 0833) Pulse Rate:  [42-115] 97 (09/25 1054) Resp:  [19-37] 37 (09/25 1020) BP: (126-193)/(61-87) 132/82 mmHg (09/25 1000) SpO2:  [91 %-98 %] 97 % (09/25 1020) FiO2 (%):  [50 %-60 %] 60 % (09/25 0724)  PHYSICAL EXAMINATION: General:  Intubated, Alert responds to commands, NAD. Sitting in chair Neuro:  RASS 0 to -2. CAM-ICU neg for delirium on sedation gtt but agitated on WUA HEENT:  NCAT, WNL Neck:  Supple, CVL site clean Cardiovascular: RRR, no m/r/g Lungs: R>L rhonchi, no wheezes Abdomen:  Soft, Nontender, + BS  Ext:  No edema, warm   PULMONARY  Recent Labs Lab 01/26/13 0111 01/26/13 0632 01/26/13 0942 01/28/13 1233 01/29/13 0325  PHART 7.247* 7.284* 7.359 7.292* 7.303*  PCO2ART 42.3 40.2 34.4* 51.8* 51.3*  PO2ART 105.0* 58.0* 70.0* 73.0* 103.0*  HCO3 18.4* 19.0* 19.3* 25.1* 25.5*  TCO2 20 20 20 27 27   O2SAT 97.0 86.0 93.0 93.0 97.0    CBC  Recent Labs Lab 01/30/13 01/30/13 0430 01/31/13 0445  HGB 7.3* 11.7* 7.1*  HCT 21.3* 34.6* 20.9*  WBC 37.7* 23.0* 35.8*  PLT 180 128* 159    COAGULATION No results found for this basename: INR,  in the last 168 hours  CARDIAC    Recent Labs Lab 01/25/13 0500  TROPONINI <0.30    Recent Labs Lab 01/25/13 0500  PROBNP 4987.0*     CHEMISTRY  Recent Labs Lab 01/27/13 0400 01/28/13 0405 01/29/13 0400 01/30/13 0430 01/31/13 0445  NA 134* 140 150* 153* 147*  K 3.1* 4.0 4.6 4.6 4.2  CL 102 108 117* 118* 112  CO2 21 24 25 26 28   GLUCOSE 157* 134* 123* 135* 139*  BUN 29* 35* 47* 51* 46*  CREATININE 0.64 0.66 0.87 0.85 0.74  CALCIUM 8.3* 8.6 8.1* 8.3* 8.3*  MG  --   --  2.0 2.1 1.9  PHOS  --   --  3.5 3.9 3.1   Estimated Creatinine Clearance: 83.5 ml/min (by C-G formula based on Cr of 0.74).   LIVER  Recent Labs Lab 01/28/13 0405  AST 16  ALT 7  ALKPHOS 101   BILITOT 0.3  PROT 6.0  ALBUMIN 1.6*     INFECTIOUS  Recent Labs Lab 01/25/13 0500  PROCALCITON 4.31     ENDOCRINE CBG (last 3)   Recent Labs  01/30/13 2356 01/31/13 0437 01/31/13 0821  GLUCAP 149* 139* 122*     IMAGING x48h  Dg Chest Port 1 View  01/31/2013   CLINICAL DATA:  Shortness of breath. Intubated patient.  EXAM: PORTABLE CHEST - 1 VIEW  COMPARISON:  Portable chest 01/27/2013, 01/28/2013 and 01/29/2013.  FINDINGS: Dense airspace disease in the right upper lung zone is again seen. Aeration in the left chest and in the lower 1/2 of the right chest has worsened since yesterday's examination. Heart size is normal. No pneumothorax is identified.  IMPRESSION: No change in dense consolidation in the right upper lung zone. Aeration in the remainder of the chest has worsened consistent with increased edema and or pneumonia.   Electronically Signed   By: Drusilla Kanner M.D.   On: 01/31/2013 05:11  agree, worsening airspace disease. ? Edema ? ALI     PULMONARY  A: Acute respiratory failure due to RUL necrotizing PNA COPD   01/31/13: Mild improved hypoxemia but CXR worse. Poor prognosis  P:  PRVC Vent bundle in place Daily SBT as indicated Diurese as tolerated Try to decrease PEEP (goal peep 5/fio2 50%) Heading towards trach  CARDIOVASCULAR  A: Shock, septic, resolved Minimally elevated troponin I Elevated BNP Mild cardiomyopathy with WMAs - suspect ischemic Hypertension Sinus tachycardia  01/30/13: Not on pressors P:   Cont ASA Cont scheduled metoprolol Cont PRN metoprolol to maintain HR < 115 Cont PRN hydralazine to maintain SBP < 170 mmHg Will need to consider further Cards eval after extubation  RENAL  A: Acute renal insufficiency, resolved Hypernatremia  Na improving  P: Free water 300cc q4 via tube  Monitor BMET intermittently Correct electrolytes as indicated  GASTROINTESTINAL  A: Protein calorie malnutrition   01/31/13 - diarrhea  + P:  Continue TFs  Cont SUP  Check C Diff  HEMATOLOGIC  A: Myelodysplastic syndrome  Anemia , chronic and critical illness- on aranesp as outpt. S/p PRBC 01/29/13 Thrombocytopenia 01/30/13 - 50% drop on day 6  . Suspected HIT. STarted DTI 01/30/13  01/31/13: On DTI. Hgb 7.1gm% and suggests 4gm% drop in 24h but we think hgb 11gm% on 01/30/13 was spurious. No obvious bleeding   P:  Recheck CBC stat f/u Duplex.  F/u HIT antibody Cont DTI  Cont aranesp per pharmacy  Cont Vit B12, Folic acid PRBC forhgb < 7gm%  INFECTIOUS  A: Septic shock - resolved 01/24/13 Necrotizing PNA, NOS  01/30/13: On Rx with unasyn  P:  Micro and abx as above Will likely  need prolonged course of abx (treat as lung abscess)  ENDOCRINE  A: Hyperglycemia without prior hx of DM Chronic steroid use P:  Cont taper stress dose steroids Continue SSI  NEUROLOGIC  A: Seizure disorder  EtOH abuse Hx Deconditioning  01/30/13: Follows commands on WUA. On fentanyl gtt with versed prn  P:  Fent gtt +  intermittent sedation Cont phenytoin - cont IV forumation due to convenience and binding interaction on po route    GLOBAL 01/28/13: Daughter updated by PCCM MD . Spiritual care gave support 01/29/13: Wife not at bedside. Does not look he wil survive this illness. Need to do goals of care; will consult palliative care 01/30/13: Wife updated/ She wil meet with palliative care. She had expresssed emotional distress to chaplain.  9/25: family updated . Palliative care meeting 02/01/13 at 6pm (dauighter has logistic issues to come early for meet)   Anders Simmonds ACNP-BC [facility] Pulmonary/Critical Care Pager # [phone] OR # [phone] if no answer    01/31/2013 11:17 AM   STAFF NOTE: I, Dr Lavinia Sharps have personally reviewed patient's available data, including medical history, events of note, physical examination and test results as part of my evaluation. I have discussed with resident/NP and other care  providers such as pharmacist, RN and RRT.  In addition,  I personally evaluated patient and elicited key findings of acute resp failure, necrotizing penumonia, failure to wean, poor prognosis, suspected HIT. Having new diarrhea; check C Diff. .  Rest per NP/medical resident whose note is outlined above and that I agree with  The patient is critically ill with multiple organ systems failure and requires high complexity decision making for assessment and support, frequent evaluation and titration of therapies, application of advanced monitoring technologies and extensive interpretation of multiple databases.   Critical Care Time devoted to patient care services described in this note is  35  Minutes.  Dr. Kalman Shan, M.D., [facility].C.P Pulmonary and Critical Care Medicine Staff Physician [facility] Pulmonary and Critical Care Pager: [phone], If no answer or between  15:00h - 7:00h: call [phone]  01/31/2013 2:27 PM

## 2013-01-31 NOTE — Progress Notes (Signed)
VASCULAR LAB PRELIMINARY  PRELIMINARY  PRELIMINARY  PRELIMINARY  Bilateral lower extremity venous Dopplers completed.    Preliminary report:  There is no DVT or SVT noted in the bilateral lower extremities.  Bess Saltzman, RVT 01/31/2013, 9:57 AM

## 2013-01-31 NOTE — Progress Notes (Signed)
ANTICOAGULATION CONSULT NOTE - Follow Up Consult  Pharmacy Consult:  Argatroban Indication:  Suspected HIT  Allergies  Allergen Reactions  . Heparin Other (See Comments)    HIT     Patient Measurements: Height: 5\' 7"  (170.2 cm) Weight: 139 lb 8.8 oz (63.3 kg) IBW/kg (Calculated) : 66.1  Vital Signs: Temp: 99.1 F (37.3 C) (09/25 0435) Temp src: Oral (09/25 0435) BP: 151/68 mmHg (09/25 0700) Pulse Rate: 96 (09/25 0700)  Labs:  Recent Labs  01/29/13 0400 01/30/13 01/30/13 0430 01/30/13 1609 01/30/13 1900 01/31/13 0445  HGB 6.1* 7.3* 11.7*  --   --  7.1*  HCT 17.8* 21.3* 34.6*  --   --  20.9*  PLT 190 180 128*  --   --  159  APTT  --   --   --  61* 69* 74*  CREATININE 0.87  --  0.85  --   --  0.74    Estimated Creatinine Clearance: 83.5 ml/min (by C-G formula based on Cr of 0.74).     Assessment: 67 YOM to continue argatroban for suspected HIT.  APTT therapeutic and his platelets is improving.  Noted significant drop in hemoglobin; no bleeding reported.   Goal of Therapy:  aPTT 50-90 seconds Monitor platelets by anticoagulation protocol: Yes Hgb > 11 g/dL Clearance of infection    Plan:  - Continue argatroban at 0.5 mcg/kg/min - Daily aPTT / CBC - F/U HIT panel result - Continue Unasyn 1.5gm IV Q6H - Continue Aranesp 100 mcg SQ q-Wed for up to 4 doses - Monitor renal fxn, clinical course - F/U hgb and d/c Aranesp if hgb sustainable (hgb >/= 11 g/dL)    Kellin Fifer D. Laney Potash, PharmD, BCPS Pager:  [phone] 01/31/2013, 7:15 AM

## 2013-01-31 NOTE — Progress Notes (Signed)
UR Completed.  Chrissi Crow Jane [phone] 01/31/2013  

## 2013-02-01 DIAGNOSIS — Z515 Encounter for palliative care: Secondary | ICD-10-CM

## 2013-02-01 LAB — CBC WITH DIFFERENTIAL/PLATELET
Basophils Absolute: 0 10*3/uL (ref 0.0–0.1)
Eosinophils Absolute: 0 10*3/uL (ref 0.0–0.7)
HCT: 20.1 % — ABNORMAL LOW (ref 39.0–52.0)
Lymphs Abs: 1.1 10*3/uL (ref 0.7–4.0)
MCH: 36.4 pg — ABNORMAL HIGH (ref 26.0–34.0)
MCHC: 33.3 g/dL (ref 30.0–36.0)
MCV: 109.2 fL — ABNORMAL HIGH (ref 78.0–100.0)
Monocytes Absolute: 1.5 10*3/uL — ABNORMAL HIGH (ref 0.1–1.0)
Neutro Abs: 34.6 10*3/uL — ABNORMAL HIGH (ref 1.7–7.7)
Platelets: 149 10*3/uL — ABNORMAL LOW (ref 150–400)
RBC: 1.84 MIL/uL — ABNORMAL LOW (ref 4.22–5.81)
RDW: 23.8 % — ABNORMAL HIGH (ref 11.5–15.5)

## 2013-02-01 LAB — BASIC METABOLIC PANEL
BUN: 45 mg/dL — ABNORMAL HIGH (ref 6–23)
CO2: 28 mEq/L (ref 19–32)
Calcium: 8 mg/dL — ABNORMAL LOW (ref 8.4–10.5)
Chloride: 114 mEq/L — ABNORMAL HIGH (ref 96–112)
Creatinine, Ser: 0.73 mg/dL (ref 0.50–1.35)
Glucose, Bld: 120 mg/dL — ABNORMAL HIGH (ref 70–99)
Sodium: 151 mEq/L — ABNORMAL HIGH (ref 135–145)

## 2013-02-01 LAB — BLOOD GAS, ARTERIAL
Bicarbonate: 28.5 mEq/L — ABNORMAL HIGH (ref 20.0–24.0)
MECHVT: 530 mL
PEEP: 8 cmH2O
Patient temperature: 98.6
TCO2: 30.1 mmol/L (ref 0–100)
pH, Arterial: 7.377 (ref 7.350–7.450)
pO2, Arterial: 78.5 mmHg — ABNORMAL LOW (ref 80.0–100.0)

## 2013-02-01 LAB — APTT: aPTT: 70 seconds — ABNORMAL HIGH (ref 24–37)

## 2013-02-01 LAB — GLUCOSE, CAPILLARY: Glucose-Capillary: 120 mg/dL — ABNORMAL HIGH (ref 70–99)

## 2013-02-01 LAB — PHOSPHORUS: Phosphorus: 4 mg/dL (ref 2.3–4.6)

## 2013-02-01 LAB — HEPARIN INDUCED THROMBOCYTOPENIA PNL
Heparin Induced Plt Ab: NEGATIVE
Patient O.D.: 0.107
UFH Low Dose 0.1 IU/mL: 0 % Release
UFH SRA Result: NEGATIVE

## 2013-02-01 MED ORDER — DEXTROSE 5 % IV SOLN
INTRAVENOUS | Status: DC
  Administered 2013-02-01 – 2013-02-03 (×2): via INTRAVENOUS
  Administered 2013-02-04: 10 mL via INTRAVENOUS

## 2013-02-01 MED ORDER — FUROSEMIDE 10 MG/ML IJ SOLN
40.0000 mg | Freq: Three times a day (TID) | INTRAMUSCULAR | Status: DC
  Administered 2013-02-01 – 2013-02-02 (×4): 40 mg via INTRAVENOUS
  Filled 2013-02-01 (×7): qty 4

## 2013-02-01 MED ORDER — LOPERAMIDE HCL 1 MG/5ML PO LIQD
4.0000 mg | Freq: Three times a day (TID) | ORAL | Status: DC
  Administered 2013-02-01 – 2013-02-05 (×13): 4 mg via ORAL
  Administered 2013-02-05: 3 mg via ORAL
  Administered 2013-02-06 – 2013-02-12 (×17): 4 mg via ORAL
  Filled 2013-02-01 (×38): qty 20

## 2013-02-01 NOTE — Progress Notes (Signed)
Elink called with 6.7 Hemoglobin result. No intervention at this time per Md.

## 2013-02-01 NOTE — Progress Notes (Signed)
PULMONARY  / CRITICAL CARE MEDICINE  Name: Adrian [name] MRN: [id number] DOB: 1950-08-27  PCP [name], MD    ADMISSION DATE: 01/23/2013  CONSULTATION DATE: 01/25/2013  LOS 9 days   REFERRING MD : Serita Kyle, MD  PRIMARY SERVICE:  PCCM   CHIEF COMPLAINT: No complaints  BRIEF PATIENT DESCRIPTION: 62 y/o male with PMH ETOH, COPD, anemia, myelodysplastic syndrome (gets iron infusion at cancer center) admitted 9/17 with large RUL PNA. Decompensated on BiPAP. PCCM was consulted 9/17 for intubation and continued care in ICU.   LINES / TUBES:  R rad a-line 9/17 >> 9/20 ETT 9/17 >>  L IJ CVL9/17 >>   CULTURES:  MRSA PCR 9/17>>> NEG Strep Ag 9/17>>> NEG  Legionella Ag 9/17 >> NEG Urine 9/17 >>  NEG Resp 9/17 >> NOF Blood 9/17 >> neg  ANTIBIOTICS:  Ceftriaxone 9/17 >>> 9/18 Azithromycin 9/17 >> 9/20 Vanc 9/18 >> 9/21 Cefepime 9/18  >> 9/21 Unsayn 9/21 >>   SIGNIFICANT EVENTS / STUDIES:  9/17 admitted by [facility] with dx of PNA. BiPAP initiated 9/17 Progressive resp failure. PCCM consulted. intubated   9/17 ECHO:  Mild LVH, EF 45-50%, grade 1 diastolic dysfunction, Septal dyssynergy. Akinesis base inferior wall. Severe hypokinesis inferolateral wall 9/18 CT w/o Contrast: Dense consolidation consistent  necrotizing PNA in RUL, interstitial PNA throughout R and L lower lobes, increase IS and AS dz in RLL 9/19 RASS 0. + F/C. Still requiring PEEP 8 and FiO2 50% 9/20 Change to PCV mode on vent to enhance comfort and synchrony 01/27/13:  Intubated. RASS 0. + F/C. Purulent ET secretions persist 01/28/13: On 70% fio2, peep 8, RR 18, PCV  and has significnat air trapping and vent dysonchrony despite sedation. RN reporting foul smelling secretions 01/29/13:  No change. 70% fio2/ 10 peep. Foul smelling et tube secretions +. Agitated and needed diprivan last night. Now doing ok on fent gtt   SUBJECTIVE/OVERNIGHT/INTERVAL HX No pressors, awake, remains peep 12  VITAL  SIGNS: Temp:  [98.6 F (37 C)-101 F (38.3 C)] 98.6 F (37 C) (09/26 0821) Pulse Rate:  [84-116] 100 (09/26 0500) Resp:  [17-38] 25 (09/26 0300) BP: (107-178)/(58-92) 116/69 mmHg (09/26 0500) SpO2:  [88 %-98 %] 96 % (09/26 0720) FiO2 (%):  [50 %-70 %] 70 % (09/26 0720)  PHYSICAL EXAMINATION: General:  Intubated, Alert responds to commands, increase aggitation Neuro:  RASS 1, follows commands HEENT:  NCAT, WNL Neck:  Supple, CVL site clean Cardiovascular: RRR, no m/r/g Lungs: diffuse rhonchi Abdomen:  Soft, Nontender, + BS  Ext:  No edema, warm   PULMONARY  Recent Labs Lab 01/26/13 0111 01/26/13 0632 01/26/13 0942 01/28/13 1233 01/29/13 0325  PHART 7.247* 7.284* 7.359 7.292* 7.303*  PCO2ART 42.3 40.2 34.4* 51.8* 51.3*  PO2ART 105.0* 58.0* 70.0* 73.0* 103.0*  HCO3 18.4* 19.0* 19.3* 25.1* 25.5*  TCO2 20 20 20 27 27   O2SAT 97.0 86.0 93.0 93.0 97.0    CBC  Recent Labs Lab 01/31/13 0445 01/31/13 1427 02/01/13 0300  HGB 7.1* 7.3* 6.7*  HCT 20.9* 21.8* 20.1*  WBC 35.8* 38.0* 37.2*  PLT 159 152 149*    COAGULATION No results found for this basename: INR,  in the last 168 hours  CARDIAC   No results found for this basename: TROPONINI,  in the last 168 hours No results found for this basename: PROBNP,  in the last 168 hours   CHEMISTRY  Recent Labs Lab 01/28/13 0405 01/29/13 0400 01/30/13 0430 01/31/13 0445 02/01/13 0300  NA  140 150* 153* 147* 151*  K 4.0 4.6 4.6 4.2 4.6  CL 108 117* 118* 112 114*  CO2 24 25 26 28 28   GLUCOSE 134* 123* 135* 139* 120*  BUN 35* 47* 51* 46* 45*  CREATININE 0.66 0.87 0.85 0.74 0.73  CALCIUM 8.6 8.1* 8.3* 8.3* 8.0*  MG  --  2.0 2.1 1.9 2.0  PHOS  --  3.5 3.9 3.1 4.0   Estimated Creatinine Clearance: 83.5 ml/min (by C-G formula based on Cr of 0.73).   LIVER  Recent Labs Lab 01/28/13 0405  AST 16  ALT 7  ALKPHOS 101  BILITOT 0.3  PROT 6.0  ALBUMIN 1.6*     INFECTIOUS No results found for this basename:  LATICACIDVEN, PROCALCITON,  in the last 168 hours   ENDOCRINE CBG (last 3)   Recent Labs  01/31/13 2342 02/01/13 0435 02/01/13 0746  GLUCAP 120* 120* 132*     IMAGING x48h  Dg Chest Port 1 View  01/31/2013   CLINICAL DATA:  Shortness of breath. Intubated patient.  EXAM: PORTABLE CHEST - 1 VIEW  COMPARISON:  Portable chest 01/27/2013, 01/28/2013 and 01/29/2013.  FINDINGS: Dense airspace disease in the right upper lung zone is again seen. Aeration in the left chest and in the lower 1/2 of the right chest has worsened since yesterday's examination. Heart size is normal. No pneumothorax is identified.  IMPRESSION: No change in dense consolidation in the right upper lung zone. Aeration in the remainder of the chest has worsened consistent with increased edema and or pneumonia.   Electronically Signed   By: Drusilla Kanner M.D.   On: 01/31/2013 05:11   Dg Abd Portable 1v  01/31/2013   CLINICAL DATA:  NG tube placement  EXAM: PORTABLE ABDOMEN - 1 VIEW  COMPARISON:  None.  FINDINGS: There is nonspecific nonobstructive bowel gas pattern. NG tube in place with tip in distal gastric region. Patchy interstitial lung prominence bilateral lower lobe.  IMPRESSION: NG tube in place with tip in distal gastric/pyloric region.   Electronically Signed   By: Natasha Mead   On: 01/31/2013 12:55  agree, worsening airspace disease. ? Edema ? ALI     PULMONARY  A: Acute respiratory failure due to RUL necrotizing PNA COPD Bilateral infiltrates, ARDS  01/31/13: Mild improved hypoxemia but CXR worse. Poor prognosis  P:  Maintain plat less 30, maintain current TV Will repeat abg to ensure giving adequate MV Diurese as tolerated, avoid pos balance as noted last 24 hrs Heading towards trach, would favor Monday pending family and patient decisions ( who appears to be able to Make his OWN decisions) Re assess cvp  CARDIOVASCULAR  A: Shock, septic, resolved Minimally elevated troponin I Mild cardiomyopathy  with WMAs - suspect ischemic Hypertension  01/30/13: Not on pressors P:   Cont ASA Cont scheduled metoprolol Cont PRN metoprolol to maintain HR < 115 Cont PRN hydralazine to maintain SBP < 170 mmHg lasix  RENAL  A: Acute renal insufficiency, resolved Hypernatremia  Na improving [place], peep 12 needed  P: Free water 300cc q4 via tube  cmet in am  Add d5w Lasix to neg 1 liter balance  GASTROINTESTINAL  A: Protein calorie malnutrition  01/31/13 - diarrhea + P:  Continue TFs  Cont SUP  vital Check C Diff - neg, add immodium  HEMATOLOGIC  A: Myelodysplastic syndrome  Anemia , chronic and critical illness- on aranesp as outpt. S/p PRBC 01/29/13 Thrombocytopenia 01/30/13 - 50% drop on day 6  .  Suspected HI -low STarted DTI 01/30/13  01/31/13: On DTI. Hgb 7.1gm% and suggests 4gm% drop in 24h but we think hgb 11gm% on 01/30/13 was spurious. No obvious bleeding  P:  Low clinical suspicion HIT, doppler neg Low threshold to NOT use DTI for now, some concern argatroban with etoh use HIT pending Cbc in am  lft in am Repeat coags now, last done 17th, now may be accurate on argatroban Low threshold not use argatroban with low clinical suspcion as well and likely need trach  INFECTIOUS  A: Septic shock - resolved 01/24/13 Necrotizing PNA, NOS  01/30/13: On Rx with unasyn  P:  Continue unasyn, consider 21 days total abx  ENDOCRINE  A: Hyperglycemia without prior hx of DM Chronic steroid use P:  Cont taper stress dose steroids as remain soff pressors and now HTN,ensure off Continue SSI  NEUROLOGIC  A: Seizure disorder  EtOH abuse Hx Deconditioning  01/30/13: Follows commands on WUA. On fentanyl gtt with versed prn  P:  Fent gtt +  intermittent sedation Cont phenytoin - cont IV forumation due to convenience and binding interaction on po route Appears he can make his OWN decisions, will  update pall care of this  Ccm time 35 min   Mcarthur Rossetti. Tyson Alias, MD, FACP Pgr:  [phone] [place] [facility]

## 2013-02-01 NOTE — Consult Note (Signed)
Palliative Medicine Team at [facility]  Date: 02/01/2013   Patient Name: Adrian [name]  DOB: Feb 17, 1951  MRN: [id number]  Age / Sex: 62 y.o., male   PCP: [name], MD Referring Physician: Alyson Reedy, MD  HPI/Reason for Consultation: [name] is a 61 yo man with myelodysplastic syndrome, long term alcohol abuse, and COPD who was admitted on 01/23/2013 with acute respiratory failure. He is on ICU day #9. PMT consulted for goals of care discussion in setting of necrotizing PNA, ARDS and overall poor prognosis for recovery. He is critically ill but alert and awake on the ventilator, he is however extremely deconditioned and incapacitated, requiring mitts to prevent un/intentional extubation. Major decisions that are in the process of being made are tracheostomy and his code status.   Participants in Discussion: The patient (HIMSELF),Wife Steward Drone, daughter Sheran Lawless and his Pastor/Reverend Mayford Knife.  Goals/Summary of Discussion:  1. Code Status:  LIMITED, everything but chest compressions and non-elective defibrillation except for witnessed V. Fib Arrest. Maintain ventilation for now. Code status will be a step by step process.  Patient himself desires tracheostomy-this would probably be much more comfortable for him and family and patient are going to need more time to process his poor prognosis-especially given how alert and communicative he is on the ventilator.  2. Scope of Treatment:  Full Scope ( Abx, blood draws,transfusions, FEN) for now. Goals will be ongoing. Not at a place where they are ready to embrace full comfort care. They do however understand and recognize the severity of his illness and that he may not survive this hospitalization.  3. Assessment/Plan:  Primary Diagnoses  1. ARDS/Necrotizing PNA 2. Myelodysplastic Syndrome 3. COPD 4. ETOH abuse 5. Seizure Disorder 6. Frequent prednisone for COPD prior to admission, not on home O2  Prognosis: poor will  probably need LTAC if he survives  PPS 10%   Active Symptoms 1. Critical Illness 2. Immobility 3. Restraints 4. Pain 5. Anxiety/agitation 6. Dyspnea/Failure to wean 7. Increased secretions  4. Palliative Prophylaxis:   Critically Ill  5. Psychosocial Spiritual Asssessment/Interventions:  Patient and Family Adjustment to Illness/Prognosis: Family have support of pastor who offered prayer at bedside. Appropriately grieving prognosis and news of severity of his illness.  Spiritual Concerns or Needs: Chaplains following.  6. Disposition: TBD  Social History:   reports that he has been smoking Cigarettes.  He has a 3.75 pack-year smoking history. He has never used smokeless tobacco. He reports that  drinks alcohol. He reports that he does not use illicit drugs.  Family History: Family History  Problem Relation Age of Onset  . Hypertension Mother     Active Medications:  Outpatient medications: Prescriptions prior to admission  Medication Sig Dispense Refill  . budesonide-formoterol (SYMBICORT) 160-4.5 MCG/ACT inhaler Inhale 2 puffs into the lungs 2 (two) times daily.      . cyanocobalamin 500 MCG tablet Take 500 mcg by mouth daily.      . diazepam (VALIUM) 5 MG tablet Take 5 mg by mouth daily.      . Fe Fum-Vit C-Vit B12-FA (HEMATOGEN FORTE PO) Take 1 tablet by mouth daily.      . folic acid (FOLVITE) 400 MCG tablet Take 400 mcg by mouth daily.      . mometasone (NASONEX) 50 MCG/ACT nasal spray Place 2 sprays into the nose daily.      . phenytoin (DILANTIN) 100 MG ER capsule Take 200 mg by mouth 2 (two) times daily.      Marland Kitchen  predniSONE (DELTASONE) 20 MG tablet Take 20-60 mg by mouth daily. Take 60 mg for 4 days, take 40 mg for 4 days take 20 mg for 4 days        Current medications: Infusions: . sodium chloride 20 mL/hr at 02/01/13 0745  . argatroban 0.5 mcg/kg/min (02/01/13 0745)  . dextrose 75 mL/hr at 02/01/13 0942  . feeding supplement (VITAL AF 1.2 CAL) 1,000 mL  (02/01/13 0954)  . fentaNYL infusion INTRAVENOUS 100 mcg/hr (02/01/13 1700)    Scheduled Medications: . ampicillin-sulbactam (UNASYN) IV  1.5 g Intravenous Q6H  . antiseptic oral rinse  15 mL Mouth Rinse QID  . budesonide (PULMICORT) nebulizer solution  0.25 mg Nebulization Q6H  . chlorhexidine  15 mL Mouth Rinse BID  . darbepoetin (ARANESP) injection - NON-DIALYSIS  100 mcg Subcutaneous Q Wed-1800  . folic acid  1 mg Per Tube Daily  . free water  300 mL Per Tube Q4H  . furosemide  40 mg Intravenous Q8H  . insulin aspart  0-15 Units Subcutaneous Q4H  . ipratropium  0.5 mg Nebulization Q6H  . levalbuterol  0.63 mg Nebulization Q6H  . loperamide  4 mg Oral Q8H  . metoprolol tartrate  25 mg Per Tube BID  . pantoprazole sodium  40 mg Per Tube Q1200  . phenytoin (DILANTIN) IV  200 mg Intravenous Q12H    PRN Medications: acetaminophen (TYLENOL) oral liquid 160 mg/5 mL, fentaNYL, hydrALAZINE, levalbuterol, metoprolol, midazolam   Vital Signs: BP 144/94  Pulse 126  Temp(Src) 98.5 F (36.9 C) (Oral)  Resp 39  Ht 5\' 7"  (1.702 m)  Wt 63.3 kg (139 lb 8.8 oz)  BMI 21.85 kg/m2  SpO2 98%   Physical Exam:  On vent, critically ill appearing. Alert, attempting to write out his wishes. Labs:  Basic or Comprehensive Metabolic Panel:    Component Value Date/Time   NA 151* 02/01/2013 0300   NA 136 09/19/2012 1325   K 4.6 02/01/2013 0300   K 4.2 09/19/2012 1325   CL 114* 02/01/2013 0300   CL 107 09/19/2012 1325   CO2 28 02/01/2013 0300   CO2 18* 09/19/2012 1325   BUN 45* 02/01/2013 0300   BUN 13.2 09/19/2012 1325   CREATININE 0.73 02/01/2013 0300   CREATININE 0.8 09/19/2012 1325   GLUCOSE 120* 02/01/2013 0300   GLUCOSE 97 09/19/2012 1325   CALCIUM 8.0* 02/01/2013 0300   CALCIUM 8.8 09/19/2012 1325   AST 16 01/28/2013 0405   AST 9 09/19/2012 1325   ALT 7 01/28/2013 0405   ALT <6 Repeated and Verified 09/19/2012 1325   ALKPHOS 101 01/28/2013 0405   ALKPHOS 171* 09/19/2012 1325   BILITOT 0.3  01/28/2013 0405   BILITOT 0.21 09/19/2012 1325   PROT 6.0 01/28/2013 0405   PROT 8.2 09/19/2012 1325   ALBUMIN 1.6* 01/28/2013 0405   ALBUMIN 2.8* 09/19/2012 1325     CBC:    Component Value Date/Time   WBC 37.2* 02/01/2013 0300   WBC 4.3 01/02/2013 1330   HGB 6.7* 02/01/2013 0300   HGB 8.8* 01/02/2013 1330   HCT 20.1* 02/01/2013 0300   HCT 26.2* 01/02/2013 1330   PLT 149* 02/01/2013 0300   PLT 262 01/02/2013 1330   MCV 109.2* 02/01/2013 0300   MCV 115.4* 01/02/2013 1330   NEUTROABS 34.6* 02/01/2013 0300   NEUTROABS 2.4 01/02/2013 1330   LYMPHSABS 1.1 02/01/2013 0300   LYMPHSABS 1.2 01/02/2013 1330   MONOABS 1.5* 02/01/2013 0300   MONOABS 0.6 01/02/2013 1330  EOSABS 0.0 02/01/2013 0300   EOSABS 0.1 01/02/2013 1330   BASOSABS 0.0 02/01/2013 0300   BASOSABS 0.0 01/02/2013 1330     BNP (last 3 results)  Recent Labs  01/23/13 0059 01/25/13 0500  PROBNP 2575.0* 4987.0*    CBG (last 3)   Recent Labs  02/01/13 0746 02/01/13 1132 02/01/13 1608  GLUCAP 132* 111* 120*    Imaging:  Dg Chest Port 1 View  01/31/2013   CLINICAL DATA:  Shortness of breath. Intubated patient.  EXAM: PORTABLE CHEST - 1 VIEW  COMPARISON:  Portable chest 01/27/2013, 01/28/2013 and 01/29/2013.  FINDINGS: Dense airspace disease in the right upper lung zone is again seen. Aeration in the left chest and in the lower 1/2 of the right chest has worsened since yesterday's examination. Heart size is normal. No pneumothorax is identified.  IMPRESSION: No change in dense consolidation in the right upper lung zone. Aeration in the remainder of the chest has worsened consistent with increased edema and or pneumonia.   Electronically Signed   By: Drusilla Kanner M.D.   On: 01/31/2013 05:11   Dg Abd Portable 1v  01/31/2013   CLINICAL DATA:  NG tube placement  EXAM: PORTABLE ABDOMEN - 1 VIEW  COMPARISON:  None.  FINDINGS: There is nonspecific nonobstructive bowel gas pattern. NG tube in place with tip in distal gastric region. Patchy  interstitial lung prominence bilateral lower lobe.  IMPRESSION: NG tube in place with tip in distal gastric/pyloric region.   Electronically Signed   By: Natasha Mead   On: 01/31/2013 12:55    Other Data:  (2D echo, EKG...)   Educational Materials Given:  DNR: Yes  MOST: Yes  Healthcare Power-of-Attorney: Yes    Time: 90 minutes Greater than 50%  of this time was spent counseling and coordinating care related to the above assessment and plan.  Signed by: Edsel Petrin, DO  02/01/2013, 7:04 PM  Please contact Palliative Medicine Team phone at [phone] for questions and concerns.

## 2013-02-01 NOTE — Progress Notes (Signed)
ANTICOAGULATION CONSULT NOTE - Follow Up Consult  Pharmacy Consult:  Argatroban Indication:  Suspected HIT  Allergies  Allergen Reactions  . Heparin Other (See Comments)    HIT     Patient Measurements: Height: 5\' 7"  (170.2 cm) Weight: 139 lb 8.8 oz (63.3 kg) IBW/kg (Calculated) : 66.1  Vital Signs: Temp: 99.6 F (37.6 C) (09/26 0438) Temp src: Oral (09/26 0438) BP: 116/69 mmHg (09/26 0500) Pulse Rate: 100 (09/26 0500)  Labs:  Recent Labs  01/30/13 0430  01/30/13 1900 01/31/13 0445 01/31/13 1427 02/01/13 0300  HGB 11.7*  --   --  7.1* 7.3* 6.7*  HCT 34.6*  --   --  20.9* 21.8* 20.1*  PLT 128*  --   --  159 152 149*  APTT  --   < > 69* 74*  --  70*  CREATININE 0.85  --   --  0.74  --  0.73  < > = values in this interval not displayed.  Estimated Creatinine Clearance: 83.5 ml/min (by C-G formula based on Cr of 0.73).     Assessment: 62 YOM to continue argatroban for suspected HIT.  APTT=7-and therapeutic, platelets =149 and HIT panel pending.  Hg= 6.7 (7.1 and 7.3 on 9/25). Noted doppler negative for DVT.  Goal of Therapy:  aPTT 50-90 seconds Monitor platelets by anticoagulation protocol: Yes Hgb > 11 g/dL Clearance of infection    Plan:  - Continue argatroban at 0.5 mcg/kg/min - Daily aPTT / CBC - F/U HIT panel result  Harland German, Pharm D 02/01/2013 7:49 AM

## 2013-02-01 NOTE — Progress Notes (Signed)
Chaplain followed up with patient and patient's wife, Adrian [name]. Learned from case manager that palliative care meeting is schedule for this evening at 6pm. Patient was intubated and resting. Adrian [name] was quiet, deep in thought. She said the doctor told her yesterday that "it seems he may never come off a breathing machine." She is "keeping faith" that this will not be the case. Her minister will be joining her for the palliative meeting this evening. Chaplain provided empathetic listening, emotional support, and compassionate presence. Patient and Adrian [name] were very appreciative of visit.  Maurene Capes, Chaplain

## 2013-02-01 NOTE — Evaluation (Signed)
Occupational Therapy Evaluation Patient Details Name: Adrian [name] MRN: [id number] DOB: 1951-02-04 Today's Date: 02/01/2013 Time: 1004-1020 OT Time Calculation (min): 16 min  OT Assessment / Plan / Recommendation History of present illness  62 y/o male with PMH ETOH, COPD, anemia, myelodysplastic syndrome (gets iron infusion at cancer center) admitted 9/17 with large RUL PNA. Decompensated on BiPAP. PCCM was consulted 9/17 for intubation and continued care in ICU.    Clinical Impression   Pt admitted with above. Pt presenting with below problem list and will benefit from acute OT services to maximize independence with ADLs and functional mobility.     OT Assessment  Patient needs continued OT Services    Follow Up Recommendations  SNF    Barriers to Discharge      Equipment Recommendations   (TBD)    Recommendations for Other Services    Frequency  Min 2X/week    Precautions / Restrictions     Pertinent Vitals/Pain Sp O2 96% on vent settings. FiO2 70%.    ADL  Grooming: Performed;Wash/dry face;+1 Total assistance Where Assessed - Grooming: Supine, head of bed up ADL Comments: Total assist with BADLs currently.  Pt responding initialy to yes/no questions with head nods but inconsistent with responding due to lethargy.    OT Diagnosis: Generalized weakness;Cognitive deficits  OT Problem List: Decreased strength;Decreased range of motion;Decreased activity tolerance;Decreased cognition;Cardiopulmonary status limiting activity;Impaired UE functional use;Impaired balance (sitting and/or standing) OT Treatment Interventions: Therapeutic exercise;Self-care/ADL training;DME and/or AE instruction;Therapeutic activities;Cognitive remediation/compensation;Patient/family education;Balance training   OT Goals(Current goals can be found in the care plan section) Acute Rehab OT Goals Patient Stated Goal: none stated OT Goal Formulation: Patient unable to participate in goal  setting Time For Goal Achievement: 02/15/13 Potential to Achieve Goals: Good  Visit Information  Last OT Received On: 02/01/13 Assistance Needed: +2 History of Present Illness:  62 y/o male with PMH ETOH, COPD, anemia, myelodysplastic syndrome (gets iron infusion at cancer center) admitted 9/17 with large RUL PNA. Decompensated on BiPAP. PCCM was consulted 9/17 for intubation and continued care in ICU.        Prior Functioning     Home Living Family/patient expects to be discharged to:: Skilled nursing facility Prior Function Comments: Pt unable to provide information.         Vision/Perception     Cognition  Cognition Arousal/Alertness: Lethargic Overall Cognitive Status: Difficult to assess Difficult to assess due to: Intubated    Extremity/Trunk Assessment Upper Extremity Assessment Upper Extremity Assessment: Generalized weakness;RUE deficits/detail;LUE deficits/detail;Difficult to assess due to impaired cognition RUE Deficits / Details: 2-/5 throughout LUE Deficits / Details: 2-/5 throughout     Mobility       Exercise General Exercises - Upper Extremity Shoulder Flexion: PROM;Both;10 reps;Supine Shoulder ABduction: PROM;Both;10 reps;Supine Shoulder ADduction: PROM;Both;10 reps;Supine Elbow Flexion: PROM;Both;10 reps;Supine Elbow Extension: PROM;Both;10 reps;Supine Wrist Flexion: PROM;Both;10 reps;Supine Wrist Extension: PROM;Both;10 reps;Supine Digit Composite Flexion: PROM;Both;10 reps;Supine Composite Extension: PROM;Both;10 reps;Supine   Balance     End of Session OT - End of Session Activity Tolerance: Patient limited by lethargy Patient left: in bed;with call bell/phone within reach  GO    02/01/2013 Cipriano Mile OTR/L Pager [phone] Office [phone]  Cipriano Mile 02/01/2013, 11:09 AM

## 2013-02-01 NOTE — Progress Notes (Signed)
Covering Clinical Child psychotherapist (CSW) informed by [facility] that pt will have a Palliative Care meeting. CSW will request weekend CSW to follow up based on GOC meeting.  Theresia Bough, MSW, LCSW [phone]

## 2013-02-01 NOTE — Progress Notes (Signed)
Wasted 1mg  of midazolam in sink. Witnessed by Marigene Ehlers, RN.

## 2013-02-02 ENCOUNTER — Inpatient Hospital Stay (HOSPITAL_COMMUNITY): Payer: BC Managed Care – PPO

## 2013-02-02 DIAGNOSIS — D72829 Elevated white blood cell count, unspecified: Secondary | ICD-10-CM

## 2013-02-02 LAB — CBC WITH DIFFERENTIAL/PLATELET
Basophils Relative: 0 % (ref 0–1)
Eosinophils Absolute: 0 10*3/uL (ref 0.0–0.7)
Eosinophils Relative: 0 % (ref 0–5)
HCT: 19.3 % — ABNORMAL LOW (ref 39.0–52.0)
Hemoglobin: 6.3 g/dL — CL (ref 13.0–17.0)
Lymphocytes Relative: 3 % — ABNORMAL LOW (ref 12–46)
Lymphs Abs: 1.1 10*3/uL (ref 0.7–4.0)
MCHC: 32.6 g/dL (ref 30.0–36.0)
Neutro Abs: 34.1 10*3/uL — ABNORMAL HIGH (ref 1.7–7.7)
Neutrophils Relative %: 93 % — ABNORMAL HIGH (ref 43–77)
RBC: 1.73 MIL/uL — ABNORMAL LOW (ref 4.22–5.81)
WBC Morphology: INCREASED

## 2013-02-02 LAB — URINALYSIS, ROUTINE W REFLEX MICROSCOPIC
Bilirubin Urine: NEGATIVE
Glucose, UA: NEGATIVE mg/dL
Ketones, ur: NEGATIVE mg/dL
Protein, ur: NEGATIVE mg/dL
pH: 5 (ref 5.0–8.0)

## 2013-02-02 LAB — HEMOGLOBIN AND HEMATOCRIT, BLOOD
HCT: 23.6 % — ABNORMAL LOW (ref 39.0–52.0)
Hemoglobin: 8 g/dL — ABNORMAL LOW (ref 13.0–17.0)

## 2013-02-02 LAB — COMPREHENSIVE METABOLIC PANEL
ALT: 9 U/L (ref 0–53)
AST: 20 U/L (ref 0–37)
Alkaline Phosphatase: 90 U/L (ref 39–117)
BUN: 42 mg/dL — ABNORMAL HIGH (ref 6–23)
CO2: 31 mEq/L (ref 19–32)
Calcium: 8 mg/dL — ABNORMAL LOW (ref 8.4–10.5)
Chloride: 108 mEq/L (ref 96–112)
GFR calc Af Amer: >90 mL/min (ref >90)
GFR calc non Af Amer: >90 mL/min (ref >90)
Glucose, Bld: 114 mg/dL — ABNORMAL HIGH (ref 70–99)
Potassium: 3.9 mEq/L (ref 3.5–5.1)
Sodium: 147 mEq/L — ABNORMAL HIGH (ref 135–145)
Total Bilirubin: 0.3 mg/dL (ref 0.3–1.2)

## 2013-02-02 LAB — GLUCOSE, CAPILLARY
Glucose-Capillary: 112 mg/dL — ABNORMAL HIGH (ref 70–99)
Glucose-Capillary: 125 mg/dL — ABNORMAL HIGH (ref 70–99)
Glucose-Capillary: 137 mg/dL — ABNORMAL HIGH (ref 70–99)

## 2013-02-02 LAB — CLOSTRIDIUM DIFFICILE BY PCR: Toxigenic C. Difficile by PCR: NEGATIVE

## 2013-02-02 LAB — APTT: aPTT: 70 seconds — ABNORMAL HIGH (ref 24–37)

## 2013-02-02 MED ORDER — FUROSEMIDE 10 MG/ML IJ SOLN
40.0000 mg | Freq: Four times a day (QID) | INTRAMUSCULAR | Status: DC
  Administered 2013-02-02 – 2013-02-03 (×4): 40 mg via INTRAVENOUS
  Filled 2013-02-02 (×5): qty 4

## 2013-02-02 MED ORDER — SODIUM CHLORIDE 0.9 % IJ SOLN
10.0000 mL | Freq: Two times a day (BID) | INTRAMUSCULAR | Status: DC
  Administered 2013-02-02 – 2013-02-07 (×6): 10 mL

## 2013-02-02 MED ORDER — POTASSIUM CHLORIDE 20 MEQ/15ML (10%) PO LIQD
40.0000 meq | Freq: Two times a day (BID) | ORAL | Status: DC
  Administered 2013-02-02 – 2013-02-12 (×21): 40 meq via ORAL
  Filled 2013-02-02 (×23): qty 30

## 2013-02-02 MED ORDER — SODIUM CHLORIDE 0.9 % IV SOLN
3.0000 g | Freq: Four times a day (QID) | INTRAVENOUS | Status: AC
  Administered 2013-02-02 – 2013-02-06 (×18): 3 g via INTRAVENOUS
  Filled 2013-02-02 (×21): qty 3

## 2013-02-02 MED ORDER — SODIUM CHLORIDE 0.9 % IJ SOLN: 10.0000 mL | INTRAMUSCULAR | Status: DC | PRN

## 2013-02-02 NOTE — Progress Notes (Signed)
ANTIBIOTIC CONSULT NOTE - FOLLOW UP  Pharmacy Consult for unasyn Indication: Necrotizing PNA  Allergies  Allergen Reactions  . Heparin Other (See Comments)    HIT     Patient Measurements: Height: 5\' 7"  (170.2 cm) Weight: 139 lb 8.8 oz (63.3 kg) IBW/kg (Calculated) : 66.1  Vital Signs: Temp: 99.5 F (37.5 C) (09/27 0807) Temp src: Oral (09/27 0807) BP: 128/75 mmHg (09/27 0600) Pulse Rate: 108 (09/27 0600) Intake/Output from previous day: 09/26 0701 - 09/27 0700 In: 3604.6 [I.V.:1356.6; NG/GT:1840; IV Piggyback:408] Out: 3870 [Urine:3870] Intake/Output from this shift:    Labs:  Recent Labs  01/31/13 0445 01/31/13 1427 02/01/13 0300 02/02/13 0500  WBC 35.8* 38.0* 37.2* 36.7*  HGB 7.1* 7.3* 6.7* 6.3*  PLT 159 152 149* 116*  CREATININE 0.74  --  0.73 0.71   Estimated Creatinine Clearance: 83.5 ml/min (by C-G formula based on Cr of 0.71). No results found for this basename: VANCOTROUGH, VANCOPEAK, VANCORANDOM, GENTTROUGH, GENTPEAK, GENTRANDOM, TOBRATROUGH, TOBRAPEAK, TOBRARND, AMIKACINPEAK, AMIKACINTROU, AMIKACIN,  in the last 72 hours     Assessment: 62 yo male with VDRF due to necrotizing PNA on unasyn. WBC= 36.7, tmax= 99.7, SCr= 0.71, CrCl ~80 and UOP 2.57ml/kg/min. Patient was on argatroban for concern of HIT and the heparin antibody and SRA is negative.   Plan:  -Change Unasyn to 3gm IV q6h to optimize coverage  -Will remove heparin allergy as patient is HIT negative  Harland German, Pharm D 02/02/2013 10:35 AM

## 2013-02-02 NOTE — Progress Notes (Signed)
eLink Physician-Brief Progress Note Patient Name: Adrian [name] DOB: 1950/10/20 MRN: [id number]  Date of Service  02/02/2013   HPI/Events of Note  Hgb continues to drift down now 6.3 from 6.9 on anticoagulation.    eICU Interventions  Plan: Transfuse 1 unit pRBC Post-txf CBC May need to consider d/c of anticoagulation if anemia worsens   Intervention Category Intermediate Interventions: Bleeding - evaluation and treatment with blood products  DETERDING,ELIZABETH 02/02/2013, 5:21 AM

## 2013-02-02 NOTE — Progress Notes (Signed)
PULMONARY  / CRITICAL CARE MEDICINE  Name: Adrian [name] MRN: [id number] DOB: 04/04/1951  PCP [name], MD    ADMISSION DATE: 01/23/2013  CONSULTATION DATE: 01/25/2013  LOS 10 days   REFERRING MD : Serita Kyle, MD  PRIMARY SERVICE:  PCCM   CHIEF COMPLAINT: No complaints  BRIEF PATIENT DESCRIPTION: 62 y/o male with PMH ETOH, COPD, anemia, myelodysplastic syndrome (gets iron infusion at cancer center) admitted 9/17 with large RUL PNA. Decompensated on BiPAP. PCCM was consulted 9/17 for intubation and continued care in ICU.   LINES / TUBES:  R rad a-line 9/17 >> 9/20 ETT 9/17 >>  L IJ CVL9/17 >>   CULTURES:  MRSA PCR 9/17>>> NEG Strep Ag 9/17>>> NEG  Legionella Ag 9/17 >> NEG Urine 9/17 >>  NEG Resp 9/17 >> NOF Blood 9/17 >> neg cdiff 9/27>>>  ANTIBIOTICS:  Ceftriaxone 9/17 >>> 9/18 Azithromycin 9/17 >> 9/20 Vanc 9/18 >> 9/21 Cefepime 9/18  >> 9/21 Unsayn 9/21 >>   SIGNIFICANT EVENTS / STUDIES:  9/17 admitted by [facility] with dx of PNA. BiPAP initiated 9/17 Progressive resp failure. PCCM consulted. intubated   9/17 ECHO:  Mild LVH, EF 45-50%, grade 1 diastolic dysfunction, Septal dyssynergy. Akinesis base inferior wall. Severe hypokinesis inferolateral wall 9/18 CT w/o Contrast: Dense consolidation consistent  necrotizing PNA in RUL, interstitial PNA throughout R and L lower lobes, increase IS and AS dz in RLL 9/19 RASS 0. + F/C. Still requiring PEEP 8 and FiO2 50% 9/20 Change to PCV mode on vent to enhance comfort and synchrony 01/27/13:  Intubated. RASS 0. + F/C. Purulent ET secretions persist 01/28/13: On 70% fio2, peep 8, RR 18, PCV  and has significnat air trapping and vent dysonchrony despite sedation. RN reporting foul smelling secretions 01/29/13:  No change. 70% fio2/ 10 peep. Foul smelling et tube secretions +. Agitated and needed diprivan last night. Now doing ok on fent gtt   SUBJECTIVE/OVERNIGHT/INTERVAL HX No pressors, awake, remains peep  12  VITAL SIGNS: Temp:  [98.4 F (36.9 C)-99.7 F (37.6 C)] 99.5 F (37.5 C) (09/27 0807) Pulse Rate:  [55-129] 108 (09/27 0600) Resp:  [10-39] 20 (09/27 0600) BP: (115-151)/(61-94) 128/75 mmHg (09/27 0600) SpO2:  [93 %-100 %] 94 % (09/27 0807) FiO2 (%):  [50 %-70 %] 50 % (09/27 0811)  PHYSICAL EXAMINATION: General:  Intubated, Alert responds to commands, seems approp  Neuro:  RASS 1, follows commands HEENT:  NCAT, WNL Neck:  Supple, CVL site clean Cardiovascular: RRR, no m/r/g Lungs: diffuse rhonchi Abdomen:  Soft, Nontender, + BS  Ext:  No edema, warm   PULMONARY  Recent Labs Lab 01/26/13 0942 01/28/13 1233 01/29/13 0325 02/01/13 0900  PHART 7.359 7.292* 7.303* 7.377  PCO2ART 34.4* 51.8* 51.3* 49.7*  PO2ART 70.0* 73.0* 103.0* 78.5*  HCO3 19.3* 25.1* 25.5* 28.5*  TCO2 20 27 27  30.1  O2SAT 93.0 93.0 97.0 96.3    CBC  Recent Labs Lab 01/31/13 1427 02/01/13 0300 02/02/13 0500  HGB 7.3* 6.7* 6.3*  HCT 21.8* 20.1* 19.3*  WBC 38.0* 37.2* 36.7*  PLT 152 149* 116*    COAGULATION No results found for this basename: INR,  in the last 168 hours  CARDIAC   No results found for this basename: TROPONINI,  in the last 168 hours No results found for this basename: PROBNP,  in the last 168 hours   CHEMISTRY  Recent Labs Lab 01/28/13 0405 01/29/13 0400 01/30/13 0430 01/31/13 0445 02/01/13 0300 02/02/13 0500  NA 140 150* 153*  147* 151* 147*  K 4.0 4.6 4.6 4.2 4.6 3.9  CL 108 117* 118* 112 114* 108  CO2 24 25 26 28 28 31   GLUCOSE 134* 123* 135* 139* 120* 114*  BUN 35* 47* 51* 46* 45* 42*  CREATININE 0.66 0.87 0.85 0.74 0.73 0.71  CALCIUM 8.6 8.1* 8.3* 8.3* 8.0* 8.0*  MG  --  2.0 2.1 1.9 2.0  --   PHOS  --  3.5 3.9 3.1 4.0  --    Estimated Creatinine Clearance: 83.5 ml/min (by C-G formula based on Cr of 0.71).   LIVER  Recent Labs Lab 01/28/13 0405 02/02/13 0500  AST 16 20  ALT 7 9  ALKPHOS 101 90  BILITOT 0.3 0.3  PROT 6.0 6.8  ALBUMIN  1.6* 1.5*     INFECTIOUS No results found for this basename: LATICACIDVEN, PROCALCITON,  in the last 168 hours   ENDOCRINE CBG (last 3)   Recent Labs  02/01/13 2355 02/02/13 0352 02/02/13 0739  GLUCAP 125* 112* 137*     IMAGING x48h  Dg Chest Port 1 View  02/02/2013   CLINICAL DATA:  Check ETT  EXAM: PORTABLE CHEST - 1 VIEW  COMPARISON:  01/31/2013  FINDINGS: Endotracheal tube terminates 4 cm above the carina.  Dense right upper lobe consolidation, unchanged.  Moderate bilateral interstitial pulmonary opacities, suggesting interstitial edema. Small layering left pleural effusion. No pneumothorax.  The heart is top-normal in size.  Enteric tube courses below the diaphragm. Stable left IJ venous catheter.  IMPRESSION: Dense right upper lobe consolidation, unchanged.  Suspected superimposed moderate interstitial edema with small left pleural effusion.   Electronically Signed   By: Charline Bills M.D.   On: 02/02/2013 07:34   Dg Abd Portable 1v  01/31/2013   CLINICAL DATA:  NG tube placement  EXAM: PORTABLE ABDOMEN - 1 VIEW  COMPARISON:  None.  FINDINGS: There is nonspecific nonobstructive bowel gas pattern. NG tube in place with tip in distal gastric region. Patchy interstitial lung prominence bilateral lower lobe.  IMPRESSION: NG tube in place with tip in distal gastric/pyloric region.   Electronically Signed   By: Natasha Mead   On: 01/31/2013 12:55  agree, worsening airspace disease. ? Edema ? ALI     PULMONARY  A: Acute respiratory failure due to RUL necrotizing PNA COPD Bilateral infiltrates, ARDS No improvement in aeration   P:  Maintain plat less 30, maintain current TV Diurese as tolerated, avoid pos balance as noted last 24 hrs Heading towards trach  CARDIOVASCULAR  A: Shock, septic, resolved Minimally elevated troponin I Mild cardiomyopathy with WMAs - suspect ischemic Hypertension  01/30/13: Not on pressors P:   Cont ASA Cont scheduled metoprolol Cont  PRN metoprolol to maintain HR < 115 Cont PRN hydralazine to maintain SBP < 170 mmHg lasix  RENAL  A: Acute renal insufficiency, resolved Hypernatremia  Na improving [place], peep 12 needed  P: Free water 300cc q4 via tube, cont  cmet in am  Cont D5 W  Lasix to neg 1 liter balance  GASTROINTESTINAL  A: Protein calorie malnutrition  01/31/13 - diarrhea + abd distended/ likely adding to weaning concern P:  Continue TFs  Cont SUP  Vital Repeat C diff   HEMATOLOGIC  A: Myelodysplastic syndrome  Anemia , chronic and critical illness- on aranesp as outpt. S/p PRBC 01/29/13 Thrombocytopenia 01/30/13 - 50% drop on day 6  . Suspected HI -low STarted DTI 01/30/13  01/31/13: On DTI. Hgb 7.1gm% and suggests 4gm% drop  in 24h but we think hgb 11gm% on 01/30/13 was spurious. No obvious bleeding Low clinical suspicion HIT, doppler neg HIT negative  Getting blood 9/27 for critical care induced anemia superimposed on myelo plastic syndrome  P:  D/c argatroban PAS for now w/ pending trach, will consider resuming [place] heparin after trach Cbc in am    INFECTIOUS  A: Septic shock - resolved 01/24/13 Necrotizing PNA, NOS  01/30/13: On Rx with unasyn  P:  Continue unasyn, length of treatment guided by CXR evolution   ENDOCRINE  A: Hyperglycemia without prior hx of DM Chronic steroid use-->stress dose steroids off.  P:  Continue SSI  NEUROLOGIC  A: Seizure disorder  EtOH abuse Hx Deconditioning  01/30/13: Follows commands on WUA. On fentanyl gtt with versed prn  P:  Fent gtt +  intermittent sedation Cont phenytoin - cont IV forumation due to convenience and binding interaction on po route Appears he can make his OWN decisions, will  update pall care of this  CC time 35 min.  Alyson Reedy, M.D. [facility] Pulmonary/Critical Care Medicine. Pager: [phone]. After hours pager: [phone].

## 2013-02-03 ENCOUNTER — Inpatient Hospital Stay (HOSPITAL_COMMUNITY): Payer: BC Managed Care – PPO

## 2013-02-03 LAB — GLUCOSE, CAPILLARY
Glucose-Capillary: 101 mg/dL — ABNORMAL HIGH (ref 70–99)
Glucose-Capillary: 104 mg/dL — ABNORMAL HIGH (ref 70–99)
Glucose-Capillary: 111 mg/dL — ABNORMAL HIGH (ref 70–99)
Glucose-Capillary: 111 mg/dL — ABNORMAL HIGH (ref 70–99)
Glucose-Capillary: 115 mg/dL — ABNORMAL HIGH (ref 70–99)
Glucose-Capillary: 133 mg/dL — ABNORMAL HIGH (ref 70–99)
Glucose-Capillary: 84 mg/dL (ref 70–99)
Glucose-Capillary: 90 mg/dL (ref 70–99)

## 2013-02-03 LAB — CBC
HCT: 21.5 % — ABNORMAL LOW (ref 39.0–52.0)
MCH: 34.7 pg — ABNORMAL HIGH (ref 26.0–34.0)
MCHC: 32.6 g/dL (ref 30.0–36.0)
MCV: 106.4 fL — ABNORMAL HIGH (ref 78.0–100.0)
Platelets: 85 10*3/uL — ABNORMAL LOW (ref 150–400)
RBC: 2.02 MIL/uL — ABNORMAL LOW (ref 4.22–5.81)

## 2013-02-03 LAB — POCT I-STAT 3, ART BLOOD GAS (G3+)
Acid-Base Excess: 9 mmol/L — ABNORMAL HIGH (ref 0.0–2.0)
Bicarbonate: 34.1 mEq/L — ABNORMAL HIGH (ref 20.0–24.0)
O2 Saturation: 95 %
Patient temperature: 98.2
pH, Arterial: 7.424 (ref 7.350–7.450)
pO2, Arterial: 73 mmHg — ABNORMAL LOW (ref 80.0–100.0)

## 2013-02-03 LAB — COMPREHENSIVE METABOLIC PANEL
AST: 18 U/L (ref 0–37)
CO2: 32 mEq/L (ref 19–32)
Calcium: 7.7 mg/dL — ABNORMAL LOW (ref 8.4–10.5)
Creatinine, Ser: 0.67 mg/dL (ref 0.50–1.35)
GFR calc Af Amer: >90 mL/min (ref >90)
GFR calc non Af Amer: >90 mL/min (ref >90)
Glucose, Bld: 245 mg/dL — ABNORMAL HIGH (ref 70–99)
Total Protein: 6.5 g/dL (ref 6.0–8.3)

## 2013-02-03 LAB — URINE CULTURE
Colony Count: NO GROWTH
Culture: NO GROWTH

## 2013-02-03 LAB — PROTIME-INR: INR: 1.37 (ref 0.00–1.49)

## 2013-02-03 MED ORDER — VECURONIUM BROMIDE 10 MG IV SOLR
INTRAVENOUS | Status: AC
  Administered 2013-02-03: 7 mg via INTRAVENOUS
  Filled 2013-02-03: qty 10

## 2013-02-03 MED ORDER — SODIUM CHLORIDE 0.9 % IV BOLUS (SEPSIS)
1000.0000 mL | Freq: Once | INTRAVENOUS | Status: AC
  Administered 2013-02-03: 1000 mL via INTRAVENOUS

## 2013-02-03 MED ORDER — VECURONIUM BROMIDE 10 MG IV SOLR
10.0000 mg | Freq: Once | INTRAVENOUS | Status: AC
  Administered 2013-02-03: 7 mg via INTRAVENOUS

## 2013-02-03 MED ORDER — ETOMIDATE 2 MG/ML IV SOLN
INTRAVENOUS | Status: AC
  Administered 2013-02-03: 20 mg via INTRAVENOUS
  Filled 2013-02-03: qty 10

## 2013-02-03 MED ORDER — ETOMIDATE 2 MG/ML IV SOLN
0.3000 mg/kg | Freq: Once | INTRAVENOUS | Status: AC
  Administered 2013-02-03: 20 mg via INTRAVENOUS

## 2013-02-03 MED ORDER — FUROSEMIDE 10 MG/ML IJ SOLN
60.0000 mg | Freq: Four times a day (QID) | INTRAMUSCULAR | Status: DC
  Administered 2013-02-03 – 2013-02-11 (×31): 60 mg via INTRAVENOUS
  Filled 2013-02-03 (×36): qty 6

## 2013-02-03 MED ORDER — MIDAZOLAM HCL 2 MG/2ML IJ SOLN
INTRAMUSCULAR | Status: AC
  Administered 2013-02-03: 2 mg via INTRAVENOUS
  Filled 2013-02-03: qty 4

## 2013-02-03 MED ORDER — MIDAZOLAM HCL 2 MG/2ML IJ SOLN
2.0000 mg | Freq: Once | INTRAMUSCULAR | Status: AC
  Administered 2013-02-03: 2 mg via INTRAVENOUS

## 2013-02-03 NOTE — Procedures (Signed)
Bedside Percutaneous Tracheostomy  Consent from wife, patient sedated and paralyzed, area cleaned, lidocaine/epi injected, skin incision done followed by blunt dissection, airway entered and visualized bronchoscopically, wire passed and visualized, airway crushed and dilated, tracheostomy placed, good color change and volume return.  Trach size six shiley cuffed sutured.  Patient tolerated the procedure well without complications.  Post trach order set placed.  Alyson Reedy, M.D. [facility] Pulmonary/Critical Care Medicine. Pager: [phone]. After hours pager: [phone].

## 2013-02-03 NOTE — Progress Notes (Signed)
RT found patient RR on vent set at 20. There are no notes or orders to back up this order. No charting shows changes from ordered RR of 12. PT vent settings returned to RR 12.

## 2013-02-03 NOTE — Procedures (Signed)
Procedure done by P Babcock ANCP-BC. At first bronch was introduce through ET tube and structures of tracheal rings, carina identified for operator of tracheostomy who was Dr Molli Knock. Light of bronch passed through trachea and skin for indentification of tracheal rings for tracheostomy puncture. After this, under bronchoscopy guidance,  ET tube was pulled back sufficiently and very carefully. The ET tube was  pulled back enough to give room for tracheostomy operator and yet at same time to to ensure a secured airway. After this was accomplished, bronchoscope was withdrawn into the ET tube. After this,  Dr Molli Knock then performed tracheostomy under video visual provided by flexible video bronchoscopy. Followng introduction of tracheostomy,  the bronchoscope was removed from ET tube and introduced through tracheostomy. Correct position of tracheostomy was ensured, with enough room between carina and distal tracheostomy and no evidence of bleeding. The bronchoscope was then withdrawn. Respiratory therapist was then instructed to remove the ET tube.  Dr Molli Knock  then proceeded to complete the tracheostomy with stay sutures   No complications   Anders Simmonds ACNP-BC [facility] Pager # [phone] OR # [phone] if no answer   Patient seen and examined, agree with above note.  I dictated the care and orders written for this patient under my direction.  Alyson Reedy, MD [phone]

## 2013-02-03 NOTE — Procedures (Signed)
Bedside Tracheostomy Insertion Procedure Note   Patient Details:   Name: Adrian [name] DOB: 1951/03/16 MRN: [id number]  Procedure: Tracheostomy  Pre Procedure Assessment: ET Tube Size: ET Tube secured at lip (cm): Bite block in place: Yes Breath Sounds: Rhonch  Post Procedure Assessment: BP 103/59  Pulse 93  Temp(Src) 98.8 F (37.1 C) (Oral)  Resp 17  Ht 5\' 7"  (1.702 m)  Wt 139 lb 8.8 oz (63.3 kg)  BMI 21.85 kg/m2  SpO2 97% O2 sats: stable throughout Complications: No apparent complications Patient did tolerate procedure well Tracheostomy Brand:Shiley Tracheostomy Style:Cuffed Tracheostomy Size: 6 Tracheostomy Secured ZHY:[id number] Tracheostomy Placement Confirmation:Trach cuff visualized and in place    Closson, Terie Purser 02/03/2013, 2:52 PM

## 2013-02-03 NOTE — Progress Notes (Signed)
PULMONARY  / CRITICAL CARE MEDICINE  Name: Adrian [name] MRN: [id number] DOB: Jun 08, 1950  PCP [name], MD    ADMISSION DATE: 01/23/2013  CONSULTATION DATE: 01/25/2013  LOS 11 days   REFERRING MD : Serita Kyle, MD  PRIMARY SERVICE:  PCCM   CHIEF COMPLAINT: No complaints  BRIEF PATIENT DESCRIPTION: 62 y/o male with PMH ETOH, COPD, anemia, myelodysplastic syndrome (gets iron infusion at cancer [facility]) admitted 9/17 with large RUL PNA. Decompensated on BiPAP. PCCM was consulted 9/17 for intubation and continued care in ICU.   LINES / TUBES:  R rad a-line 9/17 >> 9/20 ETT 9/17 >>  L IJ CVL9/17 >>   CULTURES:  MRSA PCR 9/17>>> NEG Strep Ag 9/17>>> NEG  Legionella Ag 9/17 >> NEG Urine 9/17 >>  NEG Resp 9/17 >> NOF Blood 9/17 >> neg cdiff 9/27>>>neg  ANTIBIOTICS:  Ceftriaxone 9/17 >>> 9/18 Azithromycin 9/17 >> 9/20 Vanc 9/18 >> 9/21 Cefepime 9/18  >> 9/21 Unsayn 9/21 >>   SIGNIFICANT EVENTS / STUDIES:  9/17 admitted by [facility] with dx of PNA. BiPAP initiated 9/17 Progressive resp failure. PCCM consulted. intubated   9/17 ECHO:  Mild LVH, EF 45-50%, grade 1 diastolic dysfunction, Septal dyssynergy. Akinesis base inferior wall. Severe hypokinesis inferolateral wall 9/18 CT w/o Contrast: Dense consolidation consistent  necrotizing PNA in RUL, interstitial PNA throughout R and L lower lobes, increase IS and AS dz in RLL 9/19 RASS 0. + F/C. Still requiring PEEP 8 and FiO2 50% 9/20 Change to PCV mode on vent to enhance comfort and synchrony 01/27/13:  Intubated. RASS 0. + F/C. Purulent ET secretions persist 01/28/13: On 70% fio2, peep 8, RR 18, PCV  and has significnat air trapping and vent dysonchrony despite sedation. RN reporting foul smelling secretions 01/29/13:  No change. 70% fio2/ 10 peep. Foul smelling et tube secretions +. Agitated and needed diprivan last night. Now doing ok on fent gtt   SUBJECTIVE/OVERNIGHT/INTERVAL HX No pressors, awake, remains peep  12  VITAL SIGNS: Temp:  [98.2 F (36.8 C)-100.1 F (37.8 C)] 98.2 F (36.8 C) (09/28 0424) Pulse Rate:  [12-120] 109 (09/28 0600) Resp:  [17-38] 24 (09/28 0600) BP: (101-184)/(54-100) 106/60 mmHg (09/28 0600) SpO2:  [89 %-100 %] 96 % (09/28 0744) FiO2 (%):  [40 %-60 %] 50 % (09/28 0744)  PHYSICAL EXAMINATION: General:  Intubated, Alert responds to commands, seems approp  Neuro:  RASS 1, follows commands HEENT:  NCAT, WNL Neck:  Supple, CVL site clean Cardiovascular: RRR, no m/r/g Lungs: diffuse rhonchi Abdomen:  Soft, Nontender, + BS  Ext:  No edema, warm   PULMONARY  Recent Labs Lab 01/28/13 1233 01/29/13 0325 02/01/13 0900 02/03/13 0408  PHART 7.292* 7.303* 7.377 7.424  PCO2ART 51.8* 51.3* 49.7* 52.1*  PO2ART 73.0* 103.0* 78.5* 73.0*  HCO3 25.1* 25.5* 28.5* 34.1*  TCO2 27 27 30.1 36  O2SAT 93.0 97.0 96.3 95.0    CBC  Recent Labs Lab 02/01/13 0300 02/02/13 0500 02/02/13 1442 02/03/13 0500  HGB 6.7* 6.3* 8.0* 7.0*  HCT 20.1* 19.3* 23.6* 21.5*  WBC 37.2* 36.7*  --  27.7*  PLT 149* 116*  --  85*    COAGULATION  Recent Labs Lab 02/03/13 0500  INR 1.37    CARDIAC   No results found for this basename: TROPONINI,  in the last 168 hours No results found for this basename: PROBNP,  in the last 168 hours   CHEMISTRY  Recent Labs Lab 01/28/13 0405 01/29/13 0400 01/30/13 0430 01/31/13 0445 02/01/13  0300 02/02/13 0500 02/03/13 0500  NA 140 150* 153* 147* 151* 147* 140  K 4.0 4.6 4.6 4.2 4.6 3.9 3.7  CL 108 117* 118* 112 114* 108 100  CO2 24 25 26 28 28 31  32  GLUCOSE 134* 123* 135* 139* 120* 114* 245*  BUN 35* 47* 51* 46* 45* 42* 40*  CREATININE 0.66 0.87 0.85 0.74 0.73 0.71 0.67  CALCIUM 8.6 8.1* 8.3* 8.3* 8.0* 8.0* 7.7*  MG  --  2.0 2.1 1.9 2.0  --   --   PHOS  --  3.5 3.9 3.1 4.0  --   --    Estimated Creatinine Clearance: 83.5 ml/min (by C-G formula based on Cr of 0.67).   LIVER  Recent Labs Lab 01/28/13 0405 02/02/13 0500  02/03/13 0500  AST 16 20 18   ALT 7 9 10   ALKPHOS 101 90 82  BILITOT 0.3 0.3 0.3  PROT 6.0 6.8 6.5  ALBUMIN 1.6* 1.5* 1.4*  INR  --   --  1.37     INFECTIOUS No results found for this basename: LATICACIDVEN, PROCALCITON,  in the last 168 hours   ENDOCRINE CBG (last 3)   Recent Labs  02/02/13 1934 02/02/13 2342 02/03/13 0353  GLUCAP 90 133* 115*     IMAGING x48h  Dg Chest Port 1 View  02/02/2013   CLINICAL DATA:  Check ETT  EXAM: PORTABLE CHEST - 1 VIEW  COMPARISON:  01/31/2013  FINDINGS: Endotracheal tube terminates 4 cm above the carina.  Dense right upper lobe consolidation, unchanged.  Moderate bilateral interstitial pulmonary opacities, suggesting interstitial edema. Small layering left pleural effusion. No pneumothorax.  The heart is top-normal in size.  Enteric tube courses below the diaphragm. Stable left IJ venous catheter.  IMPRESSION: Dense right upper lobe consolidation, unchanged.  Suspected superimposed moderate interstitial edema with small left pleural effusion.   Electronically Signed   By: Charline Bills M.D.   On: 02/02/2013 07:34  agree, worsening airspace disease. ? Edema ? ALI     PULMONARY  A: Acute respiratory failure due to RUL necrotizing PNA COPD Bilateral infiltrates, ARDS No improvement in aeration  P:  Maintain plat less 30, maintain current TV Diurese as tolerated, avoid pos balance as noted last 24 hrs (will escalate diuretics)  Trach planned   CARDIOVASCULAR  A: Shock, septic, resolved Minimally elevated troponin I Mild cardiomyopathy with WMAs - suspect ischemic Hypertension  01/30/13: Not on pressors P:   Cont ASA Cont scheduled metoprolol Cont PRN metoprolol to maintain HR < 115 Cont PRN hydralazine to maintain SBP < 170 mmHg lasix  RENAL  A: Acute renal insufficiency, resolved Hypernatremia  Na improving  P: Free water 300cc q4 via tube, cont  cmet in am  Cont D5 W  Lasix to neg 1 liter  balance  GASTROINTESTINAL  A: Protein calorie malnutrition  01/31/13 - diarrhea + abd distended/ likely adding to weaning concern P:  Continue TFs  Cont SUP  Vital Repeat C diff   HEMATOLOGIC  A: Myelodysplastic syndrome  Anemia , chronic and critical illness- on aranesp as outpt. S/p PRBC 01/29/13 Thrombocytopenia 01/30/13 - 50% drop on day 6  . Suspected HI -low STarted DTI 01/30/13 HIT negative  Getting blood 9/27 for critical care induced anemia superimposed on myelo plastic syndrome. May have element of hemodilution  P:  D/c argatroban PAS for now w/ pending trach, will consider resuming  heparin after trach Cbc in am    INFECTIOUS  A: Septic shock -  resolved 01/24/13 Necrotizing PNA, NOS  01/30/13: On Rx with unasyn  P:  Continue unasyn, length of treatment guided by CXR evolution   ENDOCRINE  A: Hyperglycemia without prior hx of DM Chronic steroid use-->stress dose steroids off.  P:  Continue SSI  NEUROLOGIC  A: Seizure disorder  EtOH abuse Hx Deconditioning  01/30/13: Follows commands on WUA. On fentanyl gtt with versed prn  P:  Fent gtt +  intermittent sedation Cont phenytoin - cont IV forumation due to convenience and binding interaction on po route Appears he can make his OWN decisions, will  update pall care of this  Will trach today per family's request.  Spoke with wife yesterday and ready.  Will continue weaning and plan as above.  Patient seen and examined, agree with above note.  I dictated the care and orders written for this patient under my direction.  Alyson Reedy, MD [phone]

## 2013-02-04 ENCOUNTER — Inpatient Hospital Stay (HOSPITAL_COMMUNITY): Payer: BC Managed Care – PPO

## 2013-02-04 LAB — CBC
HCT: 19.9 % — ABNORMAL LOW (ref 39.0–52.0)
Hemoglobin: 6.5 g/dL — CL (ref 13.0–17.0)
MCH: 35.1 pg — ABNORMAL HIGH (ref 26.0–34.0)
MCHC: 32.7 g/dL (ref 30.0–36.0)
MCV: 107.6 fL — ABNORMAL HIGH (ref 78.0–100.0)
RDW: 24.3 % — ABNORMAL HIGH (ref 11.5–15.5)
WBC: 25.4 10*3/uL — ABNORMAL HIGH (ref 4.0–10.5)

## 2013-02-04 LAB — BASIC METABOLIC PANEL
CO2: 32 mEq/L (ref 19–32)
Calcium: 7.9 mg/dL — ABNORMAL LOW (ref 8.4–10.5)
Creatinine, Ser: 0.75 mg/dL (ref 0.50–1.35)
GFR calc Af Amer: >90 mL/min (ref >90)
GFR calc non Af Amer: >90 mL/min (ref >90)
Sodium: 140 mEq/L (ref 135–145)

## 2013-02-04 LAB — HEMOGLOBIN AND HEMATOCRIT, BLOOD
HCT: 24 % — ABNORMAL LOW (ref 39.0–52.0)
Hemoglobin: 8.1 g/dL — ABNORMAL LOW (ref 13.0–17.0)

## 2013-02-04 LAB — GLUCOSE, CAPILLARY
Glucose-Capillary: 124 mg/dL — ABNORMAL HIGH (ref 70–99)
Glucose-Capillary: 81 mg/dL (ref 70–99)
Glucose-Capillary: 112 mg/dL — ABNORMAL HIGH (ref 70–99)

## 2013-02-04 MED ORDER — FENTANYL CITRATE 0.05 MG/ML IJ SOLN
50.0000 ug | INTRAMUSCULAR | Status: DC | PRN
  Administered 2013-02-04: 50 ug via INTRAVENOUS
  Administered 2013-02-06 – 2013-02-07 (×5): 100 ug via INTRAVENOUS
  Administered 2013-02-08: 50 ug via INTRAVENOUS
  Filled 2013-02-04 (×7): qty 2

## 2013-02-04 NOTE — Progress Notes (Signed)
PULMONARY  / CRITICAL CARE MEDICINE  Name: DOMNIQUE VANTINE MRN: [id number] DOB: [birth year]  PCP [name], MD    ADMISSION DATE: 01/23/2013  CONSULTATION DATE: 02/04/2013  LOS 12 days   REFERRING MD : Serita Kyle, MD  PRIMARY SERVICE:  PCCM   CHIEF COMPLAINT: No complaints  BRIEF PATIENT DESCRIPTION: 62 y/o male with PMH ETOH, COPD, anemia, myelodysplastic syndrome (gets iron infusion at cancer center) admitted 9/17 with large RUL PNA. Decompensated on BiPAP. PCCM was consulted 9/17 for intubation and continued care in ICU.   LINES / TUBES:  R rad a-line 9/17 >> 9/20 ETT 9/17 >>9/28 Trach (jy 9/28>>> L IJ CVL9/17 >>   CULTURES:  MRSA PCR 9/17>>> NEG Strep Ag 9/17>>> NEG  Legionella Ag 9/17 >> NEG Urine 9/17 >>  NEG Resp 9/17 >> NOF Blood 9/17 >> neg cdiff 9/27>>>neg  ANTIBIOTICS:  Ceftriaxone 9/17 >>> 9/18 Azithromycin 9/17 >> 9/20 Vanc 9/18 >> 9/21 Cefepime 9/18  >> 9/21 Unsayn 9/21 >>>  SIGNIFICANT EVENTS / STUDIES:  9/17 admitted by [facility] with dx of PNA. BiPAP initiated 9/17 Progressive resp failure. PCCM consulted. intubated   9/17 ECHO:  Mild LVH, EF 45-50%, grade 1 diastolic dysfunction, Septal dyssynergy. Akinesis base inferior wall. Severe hypokinesis inferolateral wall 9/18 CT w/o Contrast: Dense consolidation consistent  necrotizing PNA in RUL, interstitial PNA throughout R and L lower lobes, increase IS and AS dz in RLL 9/19 RASS 0. + F/C. Still requiring PEEP 8 and FiO2 50% 9/20 Change to PCV mode on vent to enhance comfort and synchrony 01/27/13:  Intubated. RASS 0. + F/C. Purulent ET secretions persist 01/28/13: On 70% fio2, peep 8, RR 18, PCV  and has significnat air trapping and vent dysonchrony despite sedation. RN reporting foul smelling secretions 01/29/13:  No change. 70% fio2/ 10 peep. Foul smelling et tube secretions +. Agitated and needed diprivan last night. Now doing ok on fent gtt 9/28 Trach placed. Fi02 50%, Peep 8.     SUBJECTIVE/OVERNIGHT/INTERVAL HX Alert and oriented, responds appropriately. Trach in place and pt tolerating well.  VITAL SIGNS: Temp:  [98.3 F (36.8 C)-100 F (37.8 C)] 98.9 F (37.2 C) (09/29 0355) Pulse Rate:  [81-124] 95 (09/29 0750) Resp:  [15-31] 19 (09/29 0750) BP: (66-157)/(45-90) 94/54 mmHg (09/29 0750) SpO2:  [92 %-100 %] 97 % (09/29 0750) FiO2 (%):  [50 %-100 %] 50 % (09/29 0415)  PHYSICAL EXAMINATION: General:  Trach in place, Alert responds to commands appropriately Neuro:  RASS 0, follows commands HEENT:  NCAT, WNL Neck:  Supple, CVL site clean Cardiovascular: RRR, no m/r/g Lungs: diffuse rhonchi, diminished sounds in RUL Abdomen:  Soft, Nontender, + BS  Ext:  SCD in place bilaterally, +1 edema at ankles, warm without rash   PULMONARY  Recent Labs Lab 01/28/13 1233 01/29/13 0325 02/01/13 0900 02/03/13 0408  PHART 7.292* 7.303* 7.377 7.424  PCO2ART 51.8* 51.3* 49.7* 52.1*  PO2ART 73.0* 103.0* 78.5* 73.0*  HCO3 25.1* 25.5* 28.5* 34.1*  TCO2 27 27 30.1 36  O2SAT 93.0 97.0 96.3 95.0    CBC  Recent Labs Lab 02/02/13 0500 02/02/13 1442 02/03/13 0500 02/04/13 0530  HGB 6.3* 8.0* 7.0* 6.5*  HCT 19.3* 23.6* 21.5* 19.9*  WBC 36.7*  --  27.7* 25.4*  PLT 116*  --  85* 83*    COAGULATION  Recent Labs Lab 02/03/13 0500  INR 1.37    CARDIAC   No results found for this basename: TROPONINI,  in the last 168 hours No results  found for this basename: PROBNP,  in the last 168 hours   CHEMISTRY  Recent Labs Lab 01/29/13 0400 01/30/13 0430 01/31/13 0445 02/01/13 0300 02/02/13 0500 02/03/13 0500 02/04/13 0500  NA 150* 153* 147* 151* 147* 140 140  K 4.6 4.6 4.2 4.6 3.9 3.7 4.2  CL 117* 118* 112 114* 108 100 98  CO2 25 26 28 28 31  32 32  GLUCOSE 123* 135* 139* 120* 114* 245* 139*  BUN 47* 51* 46* 45* 42* 40* 40*  CREATININE 0.87 0.85 0.74 0.73 0.71 0.67 0.75  CALCIUM 8.1* 8.3* 8.3* 8.0* 8.0* 7.7* 7.9*  MG 2.0 2.1 1.9 2.0  --   --    --   PHOS 3.5 3.9 3.1 4.0  --   --   --    Estimated Creatinine Clearance: 83.5 ml/min (by C-G formula based on Cr of 0.75).   LIVER  Recent Labs Lab 02/02/13 0500 02/03/13 0500  AST 20 18  ALT 9 10  ALKPHOS 90 82  BILITOT 0.3 0.3  PROT 6.8 6.5  ALBUMIN 1.5* 1.4*  INR  --  1.37   ENDOCRINE CBG (last 3)   Recent Labs  02/03/13 1936 02/03/13 2343 02/04/13 0349  GLUCAP 111* 102* 102*    IMAGING x48h  CXR 9/28 showing correct placement of NG tube and Tracheostomy tube in good position. RUL consolidation without change from previous films. New  Possible small pleural effusion in left lower lobe.    PULMONARY  A: Acute respiratory failure due to RUL necrotizing PNA COPD Bilateral infiltrates, ARDS  P:  Maintain plat less 30, maintain current TV Diurese as tolerated Goal weaning to peep 5 If unable would consider cpap 8 , ps 12-12, goal 20 min to 2 hrs Upright position  CARDIOVASCULAR  A: Shock, septic, resolved Mild cardiomyopathy with WMAs - suspect ischemic Hypertension  01/30/13: Not on pressors P:   ASA on hold, low hgb Cont scheduled metoprolol Cont PRN metoprolol to maintain HR < 115 Cont PRN hydralazine to maintain SBP < 170 mmHg Lasix to neg balance goals remain, see renal  RENAL  A: Acute renal insufficiency, resolved Na improving  P: Free water 300cc q4 via tube, cont  bmet in am Cont D5W to kvo Lasix remain, as we reduce intake  GASTROINTESTINAL  A: Protein calorie malnutrition  01/31/13 - diarrhea + (C.diff negative)  P:  Continue TFs  Cont SUP Imodium Consider peg, if no improvements weaning next 24 hrs  HEMATOLOGIC  A: Myelodysplastic syndrome  Anemia , chronic and critical illness- on aranesp as outpt. S/p PRBC 01/29/13 Thrombocytopenia 01/30/13 - 50% drop on day 6  . Suspected HI -low STarted DTI 01/30/13 HIT negative P:  Monitor CBC Received transfusion 1 unit 9/27 for critical care induced anemia superimposed on myelo  plastic syndrome. May have element of hemodilution Lasix Tx 1 unit today  INFECTIOUS  A: Septic shock - resolved 01/24/13 Necrotizing PNA, NOS  P:  Continue unasyn Establish total abx duration 14 days Has made clinical progress  ENDOCRINE  A: Hyperglycemia without prior hx of DM Chronic steroid use-->stress dose steroids off.  P:  Continue SSI  NEUROLOGIC  A: Seizure disorder  EtOH abuse Hx Deconditioning P:  Fent gtt dc, to intermittent Cont phenytoin - cont IV due to convenience and binding interaction on po route Pt / ot   Patient seen and examined, agree with above note.  I dictated the care and orders written for this patient under my direction.  Corrina Baglia, Student-PA  Ccm time 30 min  I have fully examined this patient and agree with above findings.    And edite din full '  Mcarthur Rossetti. Tyson Alias, MD, FACP Pgr: [phone] [facility]

## 2013-02-04 NOTE — Progress Notes (Signed)
Patient [id number] Adrian [name]      DOB: [birth year]      YNW:[id number]  Chart reviewed. Goals set as per note on 9/26.  Available to reengage if status changes.  Please do not hesitate to call our phone [phone].  We will shadow along with you.  Kabir Brannock L. Ladona Ridgel, MD MBA The Palliative Medicine Team at [facility] Phone: [phone] Pager: [phone]

## 2013-02-04 NOTE — Progress Notes (Signed)
CRITICAL VALUE ALERT  Critical value received: Hemoglobin 6.5  Date of notification:  02-04-13  Time of notification:  0705  Critical value read back:yes  Nurse who received alert:  Corliss Skains RN   MD notified (1st page):  Dr. Delton Coombes  Time of first page:  0720  MD notified (2nd page):  Time of second page:  Responding MD:  Dr. Delton Coombes  Time MD responded:  [phone]

## 2013-02-04 NOTE — Clinical Social Work Note (Signed)
CSW continuing to follow for possible d/c needs.  Pt may be LTACH eligible (please see RNCM for details).  CSW will continue to follow until disposition is finalized.  Vickii Penna, LCSWA [phone]  Clinical Social Work

## 2013-02-04 NOTE — Progress Notes (Signed)
IR aware of percutaneous gastric tube request. Will monitor hgb, wbc and once closer to normal and afebrile x 48hrs will proceed. Temp today 100F. Patient on unasyn for RUL PNA.  Pattricia Boss PA-C Interventional Radiology  02/04/13  10:44 AM

## 2013-02-04 NOTE — Progress Notes (Signed)
Pt pulled NG tube out. Tube feedings on hold, Pt assessed appears to be WNL. NG tube replaced, stat portable KUB ordered per protocol to confirm placement.

## 2013-02-04 NOTE — Care Management Note (Signed)
    Page 1 of 1   02/04/2013     11:40:00 AM   CARE MANAGEMENT NOTE 02/04/2013  Patient:  Adrian [name], Adrian [name]   Account Number:  [id number]  Date Initiated:  01/28/2013  Documentation initiated by:  Junius Creamer  Subjective/Objective Assessment:   adm w septic shock     Action/Plan:   lives w wife, pcp dr Molly Maduro ehinger   Anticipated DC Date:  02/07/2013   Anticipated DC Plan:  LONG TERM ACUTE CARE (LTAC)      DC Planning Services  CM consult      Choice offered to / List presented to:             Status of service:  In process, will continue to follow Medicare Important Message given?   (If response is "NO", the following Medicare IM given date fields will be blank) Date Medicare IM given:   Date Additional Medicare IM given:    Discharge Disposition:    Per UR Regulation:  Reviewed for med. necessity/level of care/duration of stay  If discussed at Long Length of Stay Meetings, dates discussed:   01/29/2013  01/31/2013    Comments:  02-04-13 10:35am Avie Arenas, RNBSN - [phone] Trached 9-28.  Ltach candidate if on trach for 7 days and failed wean x3. This needs to be documented for insurance to consider ltach. Updated respiratory and physician of need for documentation in progress notes of why placed back on vent.  Updated wife at bedside.  01-31-13 - 11:am - Avie Arenas, RNBSN [phone] Remains on vent - palliative meeting on Friday.

## 2013-02-04 NOTE — Progress Notes (Signed)
PT Cancellation Note  Patient Details Name: Adrian [name] MRN: [id number] DOB: October 27, 1950   Cancelled Treatment:    Reason Eval/Treat Not Completed: Medical issues which prohibited therapy (HgB 6.5 today, will hold PT)   Fabio Asa 02/04/2013, 8:14 AM

## 2013-02-05 LAB — TYPE AND SCREEN
ABO/RH(D): O POS
Antibody Screen: NEGATIVE
Unit division: 0

## 2013-02-05 LAB — GLUCOSE, CAPILLARY
Glucose-Capillary: 106 mg/dL — ABNORMAL HIGH (ref 70–99)
Glucose-Capillary: 110 mg/dL — ABNORMAL HIGH (ref 70–99)
Glucose-Capillary: 111 mg/dL — ABNORMAL HIGH (ref 70–99)
Glucose-Capillary: 127 mg/dL — ABNORMAL HIGH (ref 70–99)
Glucose-Capillary: 129 mg/dL — ABNORMAL HIGH (ref 70–99)
Glucose-Capillary: 134 mg/dL — ABNORMAL HIGH (ref 70–99)

## 2013-02-05 NOTE — Progress Notes (Signed)
Patient ID: Adrian [name], male   DOB: [birth year], 62 y.o.   MRN: [id number]   IR aware of G tube request Actively treated for PNA  T max 100.9 last night; 99.1 now Hgb 8.1 WBC 25 (9/29)  Will watch labs and vitals  Will move forward when wbc wnl; afeb x 48 hrs.

## 2013-02-05 NOTE — Progress Notes (Signed)
[facility] TEAM  SLP reviewed chart for readiness for SLP intervention.  Remains on vent.  Will continue to follow as appropriate. Lauralyn Shadowens L. Samson Frederic, [place] CCC/SLP Pager [phone]

## 2013-02-05 NOTE — Progress Notes (Signed)
ANTIBIOTIC CONSULT NOTE - FOLLOW UP  Pharmacy Consult for Unasyn + Aranesp Indication: Necrotizing PNA + anemia  No Known Allergies  Patient Measurements: Height: 6' (182.9 cm) Weight: 140 lb 6.9 oz (63.7 kg) IBW/kg (Calculated) : 77.6 Adjusted Body Weight:   Vital Signs: Temp: 99.1 F (37.3 C) (09/30 0857) Temp src: Oral (09/30 0857) BP: 104/61 mmHg (09/30 1039) Pulse Rate: 110 (09/30 1039) Intake/Output from previous day: 09/29 0701 - 09/30 0700 In: 3386.9 [I.V.:1038.9; Blood:300; NG/GT:1540; IV Piggyback:508] Out: 1975 [Urine:1975] Intake/Output from this shift: Total I/O In: 700 [I.V.:50; NG/GT:650] Out: -   Labs:  Recent Labs  02/03/13 0500 02/04/13 0500 02/04/13 0530 02/04/13 1630  WBC 27.7*  --  25.4*  --   HGB 7.0*  --  6.5* 8.1*  PLT 85*  --  83*  --   CREATININE 0.67 0.75  --   --    Estimated Creatinine Clearance: 84 ml/min (by C-G formula based on Cr of 0.75). No results found for this basename: VANCOTROUGH, Leodis Binet, VANCORANDOM, GENTTROUGH, GENTPEAK, GENTRANDOM, TOBRATROUGH, TOBRAPEAK, TOBRARND, AMIKACINPEAK, AMIKACINTROU, AMIKACIN,  in the last 72 hours   Microbiology: Recent Results (from the past 720 hour(s))  MRSA PCR SCREENING     Status: None   Collection Time    01/23/13  4:37 AM      Result Value Range Status   MRSA by PCR NEGATIVE  NEGATIVE Final   Comment:            The GeneXpert MRSA Assay (FDA     approved for NASAL specimens     only), is one component of a     comprehensive MRSA colonization     surveillance program. It is not     intended to diagnose MRSA     infection nor to guide or     monitor treatment for     MRSA infections.  CULTURE, BLOOD (ROUTINE X 2)     Status: None   Collection Time    01/23/13  8:00 AM      Result Value Range Status   Specimen Description BLOOD LEFT ARM   Final   Special Requests BOTTLES DRAWN AEROBIC ONLY 5CC   Final   Culture  Setup Time     Final   Value: 01/23/2013 13:06     Performed  at [facility]   Culture     Final   Value: NO GROWTH 5 DAYS     Performed at [facility]   Report Status 01/29/2013 FINAL   Final  CULTURE, BLOOD (ROUTINE X 2)     Status: None   Collection Time    01/23/13  8:05 AM      Result Value Range Status   Specimen Description BLOOD LEFT HAND   Final   Special Requests BOTTLES DRAWN AEROBIC ONLY 4CC   Final   Culture  Setup Time     Final   Value: 01/23/2013 13:06     Performed at [facility]   Culture     Final   Value: NO GROWTH 5 DAYS     Performed at [facility]   Report Status 01/29/2013 FINAL   Final  MRSA PCR SCREENING     Status: None   Collection Time    01/23/13  3:25 PM      Result Value Range Status   MRSA by PCR NEGATIVE  NEGATIVE Final   Comment:  The GeneXpert MRSA Assay (FDA     approved for NASAL specimens     only), is one component of a     comprehensive MRSA colonization     surveillance program. It is not     intended to diagnose MRSA     infection nor to guide or     monitor treatment for     MRSA infections.  URINE CULTURE     Status: None   Collection Time    01/23/13  5:59 PM      Result Value Range Status   Specimen Description URINE, CATHETERIZED   Final   Special Requests NONE   Final   Culture  Setup Time     Final   Value: 01/23/2013 18:55     Performed at [facility] Count     Final   Value: NO GROWTH     Performed at [facility]   Culture     Final   Value: NO GROWTH     Performed at [facility]   Report Status 01/25/2013 FINAL   Final  CULTURE, RESPIRATORY (NON-EXPECTORATED)     Status: None   Collection Time    01/24/13  1:51 AM      Result Value Range Status   Specimen Description TRACHEAL ASPIRATE   Final   Special Requests NONE   Final   Gram Stain     Final   Value: MODERATE WBC PRESENT, PREDOMINANTLY PMN     RARE SQUAMOUS EPITHELIAL CELLS PRESENT     FEW GRAM POSITIVE COCCI     IN PAIRS      Performed at [facility]   Culture     Final   Value: Non-Pathogenic Oropharyngeal-type Flora Isolated.     Performed at [facility]   Report Status 01/26/2013 FINAL   Final  CLOSTRIDIUM DIFFICILE BY PCR     Status: None   Collection Time    01/31/13  1:28 PM      Result Value Range Status   C difficile by pcr NEGATIVE  NEGATIVE Final  CLOSTRIDIUM DIFFICILE BY PCR     Status: None   Collection Time    02/02/13  8:49 AM      Result Value Range Status   C difficile by pcr NEGATIVE  NEGATIVE Final  URINE CULTURE     Status: None   Collection Time    02/02/13  8:49 AM      Result Value Range Status   Specimen Description URINE, CATHETERIZED   Final   Special Requests NONE   Final   Culture  Setup Time     Final   Value: 02/02/2013 12:19     Performed at [facility] Count     Final   Value: NO GROWTH     Performed at [facility]   Culture     Final   Value: NO GROWTH     Performed at [facility]   Report Status 02/03/2013 FINAL   Final  CLOSTRIDIUM DIFFICILE BY PCR     Status: None   Collection Time    02/03/13 10:43 AM      Result Value Range Status   C difficile by pcr NEGATIVE  NEGATIVE Final    Anti-infectives   Start     Dose/Rate Route Frequency Ordered Stop   02/02/13 1200  Ampicillin-Sulbactam (UNASYN) 3 g in sodium chloride 0.9 %  100 mL IVPB     3 g 100 mL/hr over 60 Minutes Intravenous Every 6 hours 02/02/13 1038 02/06/13 2359   01/27/13 1000  ampicillin-sulbactam (UNASYN) 1.5 g in sodium chloride 0.9 % 50 mL IVPB  Status:  Discontinued     1.5 g 100 mL/hr over 30 Minutes Intravenous Every 6 hours 01/27/13 0925 02/02/13 1038   01/26/13 2330  vancomycin (VANCOCIN) IVPB 1000 mg/200 mL premix  Status:  Discontinued     1,000 mg 200 mL/hr over 60 Minutes Intravenous Every 12 hours 01/26/13 2321 01/27/13 0905   01/24/13 1400  ceFEPIme (MAXIPIME) 1 g in dextrose 5 % 50 mL IVPB  Status:  Discontinued     1 g 100  mL/hr over 30 Minutes Intravenous 3 times per day 01/24/13 1022 01/27/13 0905   01/24/13 1100  vancomycin (VANCOCIN) IVPB 750 mg/150 ml premix  Status:  Discontinued     750 mg 150 mL/hr over 60 Minutes Intravenous Every 12 hours 01/24/13 1022 01/26/13 2320   01/24/13 0000  azithromycin (ZITHROMAX) 500 mg in dextrose 5 % 250 mL IVPB  Status:  Discontinued     500 mg 250 mL/hr over 60 Minutes Intravenous Every 24 hours 01/23/13 0537 01/26/13 0833   01/23/13 2200  cefTRIAXone (ROCEPHIN) 1 g in dextrose 5 % 50 mL IVPB  Status:  Discontinued     1 g 100 mL/hr over 30 Minutes Intravenous Every 24 hours 01/23/13 0537 01/24/13 1011   01/23/13 0100  cefTRIAXone (ROCEPHIN) 1 g in dextrose 5 % 50 mL IVPB     1 g 100 mL/hr over 30 Minutes Intravenous  Once 01/23/13 0058 01/23/13 0210   01/23/13 0100  azithromycin (ZITHROMAX) 500 mg in dextrose 5 % 250 mL IVPB     500 mg 250 mL/hr over 60 Minutes Intravenous  Once 01/23/13 0058 01/23/13 0347      Assessment: 62 YOM on 9/17 with progressive SOB, productive cough, and fever.   * Infectious Disease: Unasyn D#10 (total abx days 13/14) for PNA, tmax= 100.9, WBC 25.4. Necrotizing PNA per Dr. Tyson Alias.  Unasyn 9/21>> (stop date 10/1) Vanc 9/18>>9/21 (9/20: VT 13) Cefepime 9/18>>9/21 Azith 9/17 >>9/20 CTX 9/17 >>9/18  9/17 Resp>> normal flora 9/17 Urine>> NEG 9/17 blood cx - neg 9/17 MRSA PCR-NEG 9/25, 27, 28 c. Diff: neg   * Hematology / Oncology: myelodysplastic syndrome (gets iron infusion at cancer center) now on Aranesp per RX - hgb 6.5 tp 8.1 today s/p 1 units PRBC 9/29, plts 116>>83. On 300 mcg about every 3 weeks PTA - on home iron (retic pct normal, retic count low, iron level low at 25, ferritin 1099).  Plan:  - Unasyn to 3gm IV Q6H. Dose ok - On Aranesp 100 mcg SQ q-Wed x 4 doses   Fantasha Daniele S. Merilynn Finland, PharmD, [facility] Clinical Staff Pharmacist Pager [phone]  Misty Stanley Stillinger 02/05/2013,10:43 AM

## 2013-02-05 NOTE — Progress Notes (Signed)
Physical Therapy Treatment Patient Details Name: Adrian [name] MRN: [id number] DOB: February 06, 1951 Today's Date: 02/05/2013 Time: 4782-9562 PT Time Calculation (min): 21 min  PT Assessment / Plan / Recommendation  History of Present Illness  62 y/o male with PMH ETOH, COPD, anemia, myelodysplastic syndrome (gets iron infusion at cancer center) admitted 9/17 with large RUL PNA. Decompensated on BiPAP. PCCM was consulted 9/17 for intubation and continued care in ICU.    PT Comments   Pt with improved cognition. OOB mobility remains limited by being on vent and secretions requiring freq suctioning. Pt may need LTACH instead of SNF if patient can't come off vent.   Follow Up Recommendations  LTACH     Does the patient have the potential to tolerate intense rehabilitation     Barriers to Discharge        Equipment Recommendations       Recommendations for Other Services    Frequency Min 3X/week   Progress towards PT Goals Progress towards PT goals: Progressing toward goals  Plan Current plan remains appropriate    Precautions / Restrictions Precautions Precautions: Fall Precaution Comments: trach/vent Restrictions Weight Bearing Restrictions: No   Pertinent Vitals/Pain Denies pain    Mobility  Bed Mobility Bed Mobility: Supine to Sit;Sitting - Scoot to Edge of Bed Supine to Sit: 2: Max assist;HOB elevated Sitting - Scoot to [place] of Bed: 2: Max assist Details for Bed Mobility Assistance: pt initiated bilat LE to EOB, maxA for trunk elevation and to bring hips to EOB Transfers Transfers: Sit to Stand;Stand to Sit;Stand Pivot Transfers Sit to Stand: 1: +2 Total assist;From elevated surface Sit to Stand: Patient Percentage: 40% Stand to Sit: 1: +2 Total assist;To chair/3-in-1 Stand to Sit: Patient Percentage: 40% Stand Pivot Transfers: 1: +2 Total assist;From elevated surface Stand Pivot Transfers: Patient Percentage: 40% Details for Transfer Assistance: pt able to take 4  steps to chair. Pt required suctioned. Pt SpO2 .89%. Ambulation/Gait Ambulation/Gait Assistance: Not tested (comment)    Exercises     PT Diagnosis:    PT Problem List:   PT Treatment Interventions:     PT Goals (current goals can now be found in the care plan section)    Visit Information  Last PT Received On: 02/05/13 Assistance Needed: +2 History of Present Illness:  62 y/o male with PMH ETOH, COPD, anemia, myelodysplastic syndrome (gets iron infusion at cancer center) admitted 9/17 with large RUL PNA. Decompensated on BiPAP. PCCM was consulted 9/17 for intubation and continued care in ICU.     Subjective Data  Subjective: pt received supine in bed alert without mittens   Cognition  Cognition Arousal/Alertness: Awake/alert Behavior During Therapy: WFL for tasks assessed/performed Overall Cognitive Status: Impaired/Different from baseline Area of Impairment: Safety/judgement;Problem solving;Orientation Orientation Level:  (v/cs for hospital) Problem Solving: Slow processing;Requires verbal cues;Requires tactile cues    Balance  Balance Balance Assessed: Yes Static Sitting Balance Static Sitting - Balance Support: Feet supported Static Sitting - Level of Assistance: 4: Min assist Static Sitting - Comment/# of Minutes: sat x 5 min  End of Session PT - End of Session Equipment Utilized During Treatment: Gait belt;Oxygen Activity Tolerance: Patient limited by fatigue Patient left: in chair;with call bell/phone within reach;with nursing/sitter in room Nurse Communication: Mobility status   GP     Marcene Brawn 02/05/2013, 4:32 PM  Lewis Shock, PT, DPT Pager #: [phone] Office #: [phone]

## 2013-02-05 NOTE — Progress Notes (Signed)
NUTRITION FOLLOW UP / CONSULT  DOCUMENTATION CODES  Per approved criteria   -Severe malnutrition in the context of chronic illness    Intervention:    Conitnue Vital AF 1.2 @ 70 ml/h to provide 2016 kcals, 126 gm protein, 1362 ml free water daily.  Nutrition Dx:   Malnutrition related to poor PO intake, SOB as evidenced by physical exam. Ongoing.  Goal:   Intake to meet >90% of estimated nutrition needs, met.   Monitor:   TF tolerance/adequacy, weight trend, labs, vent status.  Assessment:   Patient admitted with shortness of breath. Pt with PMHx of COPD, MDS, and renal insufficiency. Pt found to have sepsis r/t to pneumonia and NSTEMI. Transferred to ICU 9/17 afternoon with respiratory decompensation requiring intubation. Patient is severely malnourished.   TF was initiated 9/17. Electrolytes are now WNL.   Patient is currently intubated on ventilator support. S/p trach. MV: 13.7 L/min Temp:Temp (24hrs), Avg:99.6 F (37.6 C), Min:99.1 F (37.3 C), Max:100.9 F (38.3 C)  Propofol: none  Pt working with PT.  Full scope of care per Palliative care consult.  Patient has NG tube in place. Vital AF 1.2 is infusing @ 60 ml/hr.  Awaiting PEG placement.  Free water flushes: 300 ml every 4 hrs providing 1800 of free water.  Residuals: 5-10 mL  Wt + 1 lbs over the past week.  Height: Ht Readings from Last 1 Encounters:  02/04/13 6' (1.829 m)    Weight Status:  Trending up with fluids Wt Readings from Last 1 Encounters:  02/05/13 140 lb 6.9 oz (63.7 kg)  01/23/13  116 lb 13.5 oz (53 kg)   Re-estimated needs:  Kcal: 2000 Protein: 85-105 gm Fluid: 1.8-2 L  Skin: no issues noted  Diet Order:  NPO  TF Order: Vital AF 1.2 at 70 ml/h providing 2016 kcals, 126 gm protein, 1362 ml free water daily.   Intake/Output Summary (Last 24 hours) at 02/05/13 1455 Last data filed at 02/05/13 1300  Gross per 24 hour  Intake 3267.92 ml  Output   1425 ml  Net 1842.92 ml     Last BM: 9/30  Labs:   Recent Labs Lab 01/30/13 0430 01/31/13 0445 02/01/13 0300 02/02/13 0500 02/03/13 0500 02/04/13 0500  NA 153* 147* 151* 147* 140 140  K 4.6 4.2 4.6 3.9 3.7 4.2  CL 118* 112 114* 108 100 98  CO2 26 28 28 31  32 32  BUN 51* 46* 45* 42* 40* 40*  CREATININE 0.85 0.74 0.73 0.71 0.67 0.75  CALCIUM 8.3* 8.3* 8.0* 8.0* 7.7* 7.9*  MG 2.1 1.9 2.0  --   --   --   PHOS 3.9 3.1 4.0  --   --   --   GLUCOSE 135* 139* 120* 114* 245* 139*    CBG (last 3)   Recent Labs  02/05/13 0434 02/05/13 0848 02/05/13 1209  GLUCAP 129* 134* 111*    Scheduled Meds: . ampicillin-sulbactam (UNASYN) IV  3 g Intravenous Q6H  . antiseptic oral rinse  15 mL Mouth Rinse QID  . budesonide (PULMICORT) nebulizer solution  0.25 mg Nebulization Q6H  . chlorhexidine  15 mL Mouth Rinse BID  . darbepoetin (ARANESP) injection - NON-DIALYSIS  100 mcg Subcutaneous Q Wed-1800  . folic acid  1 mg Per Tube Daily  . free water  300 mL Per Tube Q4H  . furosemide  60 mg Intravenous Q6H  . insulin aspart  0-15 Units Subcutaneous Q4H  . ipratropium  0.5 mg  Nebulization Q6H  . levalbuterol  0.63 mg Nebulization Q6H  . loperamide  4 mg Oral Q8H  . metoprolol tartrate  25 mg Per Tube BID  . pantoprazole sodium  40 mg Per Tube Q1200  . phenytoin (DILANTIN) IV  200 mg Intravenous Q12H  . potassium chloride  40 mEq Oral Q12H  . sodium chloride  10-40 mL Intracatheter Q12H    Continuous Infusions: . sodium chloride 20 mL/hr at 02/01/13 0745  . dextrose 10 mL (02/04/13 1859)  . feeding supplement (VITAL AF 1.2 CAL) 1,000 mL (02/05/13 1218)    Loyce Dys, MS RD LDN Clinical Inpatient Dietitian Pager: [phone] Weekend/After hours pager: [phone]

## 2013-02-05 NOTE — Progress Notes (Signed)
RT attempted PS wean. Pt placed on CPAP/PS 5/5 40%. Pt failed wean due to increased RR. Pt placed back on full support at this time.RT will continue to monitor.

## 2013-02-05 NOTE — Progress Notes (Signed)
PULMONARY  / CRITICAL CARE MEDICINE  Name: HERMENEGILDO CLAUSEN MRN: [id number] DOB: 1950/05/24  PCP [name], MD    ADMISSION DATE: 01/23/2013  CONSULTATION DATE: 02/04/2013  LOS 13 days   REFERRING MD : Serita Kyle, MD  PRIMARY SERVICE:  PCCM   CHIEF COMPLAINT: No complaints  BRIEF PATIENT DESCRIPTION: 62 y/o male with PMH ETOH, COPD, anemia, myelodysplastic syndrome (gets iron infusion at cancer center) admitted 9/17 with large RUL PNA. Decompensated on BiPAP. PCCM was consulted 9/17 for intubation and continued care in ICU.   LINES / TUBES:  R rad a-line 9/17 >> 9/20 ETT 9/17 >>9/28 Trach (jy 9/28>>> L IJ CVL9/17 >>   CULTURES:  MRSA PCR 9/17>>> NEG Strep Ag 9/17>>> NEG  Legionella Ag 9/17 >> NEG Urine 9/17 >>  NEG Resp 9/17 >> NOF Blood 9/17 >> neg cdiff 9/27>>>neg  ANTIBIOTICS:  Ceftriaxone 9/17 >>> 9/18 Azithromycin 9/17 >> 9/20 Vanc 9/18 >> 9/21 Cefepime 9/18  >> 9/21 Unasyn 9/21 >>>  SIGNIFICANT EVENTS / STUDIES:  9/17 admitted by [facility] with dx of PNA. BiPAP initiated 9/17 Progressive resp failure. PCCM consulted. intubated   9/17 ECHO:  Mild LVH, EF 45-50%, grade 1 diastolic dysfunction, Septal dyssynergy. Akinesis base inferior wall. Severe hypokinesis inferolateral wall 9/18 CT w/o Contrast: Dense consolidation consistent  necrotizing PNA in RUL, interstitial PNA throughout R and L lower lobes, increase IS and AS dz in RLL 9/19 RASS 0. + F/C. Still requiring PEEP 8 and FiO2 50% 9/20 Change to PCV mode on vent to enhance comfort and synchrony 01/27/13:  Intubated. RASS 0. + F/C. Purulent ET secretions persist 01/28/13: On 70% fio2, peep 8, RR 18, PCV  and has significnat air trapping and vent dysonchrony despite sedation. RN reporting foul smelling secretions 01/29/13:  No change. 70% fio2/ 10 peep. Foul smelling et tube secretions +. Agitated and needed diprivan last night. Now doing ok on fent gtt 9/28 Trach placed. Fi02 50%, Peep 8.     SUBJECTIVE/OVERNIGHT/INTERVAL HX Alert and oriented, responds appropriately.  Dis not tolerate PSV 5/PEEP this am  VITAL SIGNS: Temp:  [99.1 F (37.3 C)-100.9 F (38.3 C)] 99.1 F (37.3 C) (09/30 0857) Pulse Rate:  [104-123] 119 (09/30 1524) Resp:  [13-35] 35 (09/30 1524) BP: (104-165)/(59-94) 113/66 mmHg (09/30 1524) SpO2:  [90 %-100 %] 95 % (09/30 1524) FiO2 (%):  [40 %] 40 % (09/30 1524) Weight:  [63.7 kg (140 lb 6.9 oz)] 63.7 kg (140 lb 6.9 oz) (09/30 0430)  PHYSICAL EXAMINATION: General:  Trach in place, Alert responds to commands appropriately Neuro:  RASS 0, follows commands HEENT:  NCAT, WNL Neck:  Supple, CVL site clean Cardiovascular: RRR, no m/r/g Lungs: diffuse rhonchi, diminished sounds in RUL Abdomen:  Soft, Nontender, + BS  Ext:  SCD in place bilaterally, +1 edema at ankles, warm without rash   PULMONARY  Recent Labs Lab 02/01/13 0900 02/03/13 0408  PHART 7.377 7.424  PCO2ART 49.7* 52.1*  PO2ART 78.5* 73.0*  HCO3 28.5* 34.1*  TCO2 30.1 36  O2SAT 96.3 95.0    CBC  Recent Labs Lab 02/02/13 0500  02/03/13 0500 02/04/13 0530 02/04/13 1630  HGB 6.3*  < > 7.0* 6.5* 8.1*  HCT 19.3*  < > 21.5* 19.9* 24.0*  WBC 36.7*  --  27.7* 25.4*  --   PLT 116*  --  85* 83*  --   < > = values in this interval not displayed.  COAGULATION  Recent Labs Lab 02/03/13 0500  INR 1.37  CARDIAC   No results found for this basename: TROPONINI,  in the last 168 hours No results found for this basename: PROBNP,  in the last 168 hours   CHEMISTRY  Recent Labs Lab 01/30/13 0430 01/31/13 0445 02/01/13 0300 02/02/13 0500 02/03/13 0500 02/04/13 0500  NA 153* 147* 151* 147* 140 140  K 4.6 4.2 4.6 3.9 3.7 4.2  CL 118* 112 114* 108 100 98  CO2 26 28 28 31  32 32  GLUCOSE 135* 139* 120* 114* 245* 139*  BUN 51* 46* 45* 42* 40* 40*  CREATININE 0.85 0.74 0.73 0.71 0.67 0.75  CALCIUM 8.3* 8.3* 8.0* 8.0* 7.7* 7.9*  MG 2.1 1.9 2.0  --   --   --   PHOS 3.9  3.1 4.0  --   --   --    Estimated Creatinine Clearance: 84 ml/min (by C-G formula based on Cr of 0.75).   LIVER  Recent Labs Lab 02/02/13 0500 02/03/13 0500  AST 20 18  ALT 9 10  ALKPHOS 90 82  BILITOT 0.3 0.3  PROT 6.8 6.5  ALBUMIN 1.5* 1.4*  INR  --  1.37   ENDOCRINE CBG (last 3)   Recent Labs  02/05/13 0434 02/05/13 0848 02/05/13 1209  GLUCAP 129* 134* 111*    IMAGING x48h  CXR 9/28 showing correct placement of NG tube and Tracheostomy tube in good position. RUL consolidation without change from previous films. New  Possible small pleural effusion in left lower lobe.    PULMONARY  A: Acute respiratory failure due to RUL necrotizing PNA COPD Bilateral infiltrates, ARDS P:  Maintain plat less 30, maintain current TV Diurese as tolerated Begin high PS weaning on 10/1 Upright position  CARDIOVASCULAR  A: Shock, septic, resolved Mild cardiomyopathy with WMAs - suspect ischemic Hypertension P:   ASA on hold, low hgb Cont scheduled metoprolol Cont PRN metoprolol to maintain HR < 115 Cont PRN hydralazine to maintain SBP < 170 mmHg Lasix ordered, I remains >> O, may need to adjust  RENAL  A: Acute renal insufficiency, resolved P: Free water 300cc q4 via tube, cont  Follow bmp  Cont D5W to kvo Continue lasix 60mg  q6h  GASTROINTESTINAL  A: Protein calorie malnutrition  01/31/13 - diarrhea + (C.diff negative) P:  Continue TFs  Cont SUP Imodium PEG requested, IR will perform when he completes rx for PNA  HEMATOLOGIC  A: Myelodysplastic syndrome  Anemia , chronic and critical illness- on aranesp as outpt. S/p PRBC 01/29/13 Thrombocytopenia 01/30/13 - 50% drop on day 6  . Suspected HI -low STarted DTI 01/30/13 HIT negative P:  Monitor CBC Received transfusion 1 unit 9/27 and 9/29 for critical care induced anemia superimposed on myelodysplastic syndrome. May have element of hemodilution  INFECTIOUS  A: Septic shock - resolved  01/24/13 Necrotizing PNA, NOS P:  Continue unasyn Establish total abx duration 14 days  ENDOCRINE  A: Hyperglycemia without prior hx of DM Chronic steroid use-->stress dose steroids off.  P:  Continue SSI  NEUROLOGIC  A: Seizure disorder  EtOH abuse Hx Deconditioning P:  Fent gtt dc, to intermittent Cont phenytoin - cont IV due to convenience and binding interaction on po route Pt / ot  Levy Pupa, MD, PhD 02/05/2013, 4:19 PM [facility] [phone] or if no answer [phone]

## 2013-02-06 ENCOUNTER — Inpatient Hospital Stay (HOSPITAL_COMMUNITY): Payer: BC Managed Care – PPO

## 2013-02-06 LAB — CBC
Hemoglobin: 8.9 g/dL — ABNORMAL LOW (ref 13.0–17.0)
MCH: 35.2 pg — ABNORMAL HIGH (ref 26.0–34.0)
MCHC: 34.4 g/dL (ref 30.0–36.0)
MCV: 102.4 fL — ABNORMAL HIGH (ref 78.0–100.0)
Platelets: 88 10*3/uL — ABNORMAL LOW (ref 150–400)
WBC: 22.3 10*3/uL — ABNORMAL HIGH (ref 4.0–10.5)

## 2013-02-06 LAB — RETICULOCYTES
RBC.: 2.53 MIL/uL — ABNORMAL LOW (ref 4.22–5.81)
Retic Count, Absolute: 10.1 10*3/uL — ABNORMAL LOW (ref 19.0–186.0)
Retic Ct Pct: 0.4 % (ref 0.4–3.1)

## 2013-02-06 LAB — GLUCOSE, CAPILLARY
Glucose-Capillary: 116 mg/dL — ABNORMAL HIGH (ref 70–99)
Glucose-Capillary: 117 mg/dL — ABNORMAL HIGH (ref 70–99)
Glucose-Capillary: 135 mg/dL — ABNORMAL HIGH (ref 70–99)

## 2013-02-06 MED ORDER — FREE WATER
200.0000 mL | Freq: Four times a day (QID) | Status: DC
  Administered 2013-02-06 – 2013-02-11 (×19): 200 mL

## 2013-02-06 NOTE — Progress Notes (Signed)
Patient pulled PICC line out catheter appears intact IV therapy Adrian [name] notified and states that marking of 40 on catheter is correct and ok to place dressing on site. Minimal bleeding noted. MD to be notified

## 2013-02-06 NOTE — Progress Notes (Signed)
Occupational Therapy Treatment Patient Details Name: Adrian [name] MRN: [id number] DOB: 03-Feb-1951 Today's Date: 02/06/2013 Time: 8469-6295 OT Time Calculation (min): 26 min  OT Assessment / Plan / Recommendation  History of present illness  62 y/o male with PMH ETOH, COPD, anemia, myelodysplastic syndrome (gets iron infusion at cancer center) admitted 9/17 with large RUL PNA. Decompensated on BiPAP. PCCM was consulted 9/17 for intubation and continued care in ICU.    OT comments  Pt with significant improvement since OT eval.  Will continue to follow acutely.  May need to update d/c plan to [facility] depending if pt remains on vent.  Follow Up Recommendations  SNF    Barriers to Discharge       Equipment Recommendations  3 in 1 bedside comode    Recommendations for Other Services    Frequency Min 2X/week   Progress towards OT Goals Progress towards OT goals: Progressing toward goals;Goals met and updated - see care plan  Plan Discharge plan remains appropriate    Precautions / Restrictions Precautions Precautions: Fall Precaution Comments: trach/vent   Pertinent Vitals/Pain RR 20s-50s. HR in 120s.    ADL  Grooming: Performed;Wash/dry face;Minimal assistance Where Assessed - Grooming: Supine, head of bed up Lower Body Bathing: Performed;Moderate assistance Where Assessed - Lower Body Bathing: Supine, head of bed up ADL Comments: Pt consistently following one step commands. Pt with urinary incontinence upon OT arrival.  Pt able to wash LEs and front peri area with mod assist while laying in bed. Pt then rolled to L/R sides in order for OT to remove soiled pad and place clean one in bed.  Pt then sat EOB and perform bil UE AROM exercises.  3+/5 in bil hand and elbow, 2+/5 in bil shoulders.    OT Diagnosis:    OT Problem List:   OT Treatment Interventions:     OT Goals(current goals can now be found in the care plan section) Acute Rehab OT Goals Patient Stated Goal: none  stated OT Goal Formulation: Patient unable to participate in goal setting Time For Goal Achievement: 02/15/13 Potential to Achieve Goals: Good  Visit Information  Last OT Received On: 02/06/13 Assistance Needed: +2 History of Present Illness:  62 y/o male with PMH ETOH, COPD, anemia, myelodysplastic syndrome (gets iron infusion at cancer center) admitted 9/17 with large RUL PNA. Decompensated on BiPAP. PCCM was consulted 9/17 for intubation and continued care in ICU.     Subjective Data      Prior Functioning       Cognition  Cognition Arousal/Alertness: Awake/alert Behavior During Therapy: WFL for tasks assessed/performed Overall Cognitive Status: Impaired/Different from baseline Area of Impairment: Safety/judgement General Comments: Pt at times attempting to pull at lines but stops with verbal cues. Difficult to assess due to: Intubated    Mobility  Bed Mobility Bed Mobility: Supine to Sit;Sit to Supine Supine to Sit: 3: Mod assist;With rails;HOB elevated Sit to Supine: 3: Mod assist;HOB flat Details for Bed Mobility Assistance: Assist to bil LEs and trunk for support OOB.    Exercises  General Exercises - Upper Extremity Shoulder Flexion: AAROM;Both;10 reps Shoulder Extension: AAROM;Both;10 reps;Seated Elbow Flexion: AROM;Both;10 reps Elbow Extension: AROM;Both;10 reps Wrist Flexion: AROM;Both;10 reps Wrist Extension: AROM;Both;10 reps Digit Composite Flexion: AROM;Both;10 reps Composite Extension: AROM;Both;10 reps   Balance Balance Balance Assessed: Yes Static Sitting Balance Static Sitting - Balance Support: Feet supported;No upper extremity supported Static Sitting - Level of Assistance: 4: Min assist Static Sitting - Comment/# of Minutes: Pt  sat EOB ~7 minutes while performing bil UE exercises.   End of Session OT - End of Session Activity Tolerance: Patient tolerated treatment well Patient left: in bed;with restraints reapplied;with nursing/sitter in  room Nurse Communication: Mobility status  GO   02/06/2013 Cipriano Mile OTR/L Pager [phone] Office [phone]   Cipriano Mile 02/06/2013, 5:28 PM

## 2013-02-06 NOTE — Progress Notes (Signed)
Patient ID: Adrian [name], male   DOB: 10/03/1950, 62 y.o.   MRN: [id number]   Known request for percutaneous gastric tube in IR  Wbc 22.3 (25.4) trending down On Unasyn T max 100 last pm  Will continue to keep on IR radar When afeb x 48 hrs and wbc almost wnl we can move foward

## 2013-02-06 NOTE — Progress Notes (Signed)
Pt has removed several NGTs, IVs, his PICC and the ventilator from his trach several times. Staff unable to keep mitts on his hands, pt will bite and remove mitts. Pt restless and moving about it bed. Team paged to address.

## 2013-02-06 NOTE — Progress Notes (Signed)
PULMONARY  / CRITICAL CARE MEDICINE  Name: ZACCHAEUS HALM MRN: [id number] DOB: 1951/03/23  PCP [name], MD    ADMISSION DATE: 01/23/2013  CONSULTATION DATE: 02/04/2013  LOS 14 days   REFERRING MD : Serita Kyle, MD  PRIMARY SERVICE:  PCCM   CHIEF COMPLAINT: No complaints  BRIEF PATIENT DESCRIPTION: 62 y/o male with PMH ETOH, COPD, anemia, myelodysplastic syndrome (gets iron infusion at cancer center) admitted 9/17 with large RUL PNA. Decompensated on BiPAP. PCCM was consulted 9/17 for intubation and continued care in ICU.   LINES / TUBES:  R rad a-line 9/17 >> 9/20 ETT 9/17 >>9/28 Trach (jy 9/28>>> L IJ CVL9/17 >> out PICC > 10/1  CULTURES:  MRSA PCR 9/17>>> NEG Strep Ag 9/17>>> NEG  Legionella Ag 9/17 >> NEG Urine 9/17 >>  NEG Resp 9/17 >> NOF Blood 9/17 >> neg cdiff 9/27>>>neg  ANTIBIOTICS:  Ceftriaxone 9/17 >>> 9/18 Azithromycin 9/17 >> 9/20 Vanc 9/18 >> 9/21 Cefepime 9/18  >> 9/21 Unasyn 9/21 >>>   SIGNIFICANT EVENTS / STUDIES:  9/17 admitted by [facility] with dx of PNA. BiPAP initiated 9/17 Progressive resp failure. PCCM consulted. intubated   9/17 ECHO:  Mild LVH, EF 45-50%, grade 1 diastolic dysfunction, Septal dyssynergy. Akinesis base inferior wall. Severe hypokinesis inferolateral wall 9/18 CT w/o Contrast: Dense consolidation consistent  necrotizing PNA in RUL, interstitial PNA throughout R and L lower lobes, increase IS and AS dz in RLL 9/19 RASS 0. + F/C. Still requiring PEEP 8 and FiO2 50% 9/20 Change to PCV mode on vent to enhance comfort and synchrony 01/27/13:  Intubated. RASS 0. + F/C. Purulent ET secretions persist 01/28/13: On 70% fio2, peep 8, RR 18, PCV  and has significnat air trapping and vent dysonchrony despite sedation. RN reporting foul smelling secretions 01/29/13:  No change. 70% fio2/ 10 peep. Foul smelling et tube secretions +. Agitated and needed diprivan last night. Now doing ok on fent gtt 9/28 Trach placed. Fi02 50%, Peep 8.     SUBJECTIVE/OVERNIGHT/INTERVAL HX Difficulty with PSV Pulled out his PICC today, was confused  VITAL SIGNS: Temp:  [98.4 F (36.9 C)-100 F (37.8 C)] 98.4 F (36.9 C) (10/01 1244) Pulse Rate:  [109-126] 114 (10/01 1244) Resp:  [23-41] 28 (10/01 1244) BP: (113-150)/(64-82) 134/77 mmHg (10/01 1244) SpO2:  [95 %-100 %] 97 % (10/01 1244) FiO2 (%):  [30 %-40 %] 30 % (10/01 1244) Weight:  [63.7 kg (140 lb 6.9 oz)] 63.7 kg (140 lb 6.9 oz) (10/01 0410)  PHYSICAL EXAMINATION: General:  Trach in place, Alert responds to commands appropriately Neuro:  RASS 0, follows commands HEENT:  NCAT, WNL Neck:  Supple, CVL site clean Cardiovascular: RRR, no m/r/g Lungs: diffuse rhonchi, diminished sounds in RUL Abdomen:  Soft, Nontender, + BS  Ext:  SCD in place bilaterally, +1 edema at ankles, warm without rash   PULMONARY  Recent Labs Lab 02/01/13 0900 02/03/13 0408  PHART 7.377 7.424  PCO2ART 49.7* 52.1*  PO2ART 78.5* 73.0*  HCO3 28.5* 34.1*  TCO2 30.1 36  O2SAT 96.3 95.0    CBC  Recent Labs Lab 02/03/13 0500 02/04/13 0530 02/04/13 1630 02/06/13  HGB 7.0* 6.5* 8.1* 8.9*  HCT 21.5* 19.9* 24.0* 25.9*  WBC 27.7* 25.4*  --  22.3*  PLT 85* 83*  --  88*    COAGULATION  Recent Labs Lab 02/03/13 0500  INR 1.37    CARDIAC   No results found for this basename: TROPONINI,  in the last 168 hours No  results found for this basename: PROBNP,  in the last 168 hours   CHEMISTRY  Recent Labs Lab 01/31/13 0445 02/01/13 0300 02/02/13 0500 02/03/13 0500 02/04/13 0500  NA 147* 151* 147* 140 140  K 4.2 4.6 3.9 3.7 4.2  CL 112 114* 108 100 98  CO2 28 28 31  32 32  GLUCOSE 139* 120* 114* 245* 139*  BUN 46* 45* 42* 40* 40*  CREATININE 0.74 0.73 0.71 0.67 0.75  CALCIUM 8.3* 8.0* 8.0* 7.7* 7.9*  MG 1.9 2.0  --   --   --   PHOS 3.1 4.0  --   --   --    Estimated Creatinine Clearance: 84 ml/min (by C-G formula based on Cr of 0.75).   LIVER  Recent Labs Lab  02/02/13 0500 02/03/13 0500  AST 20 18  ALT 9 10  ALKPHOS 90 82  BILITOT 0.3 0.3  PROT 6.8 6.5  ALBUMIN 1.5* 1.4*  INR  --  1.37   ENDOCRINE CBG (last 3)   Recent Labs  02/06/13 0442 02/06/13 0756 02/06/13 1159  GLUCAP 129* 129* 117*    IMAGING x48h  CXR 9/28 showing correct placement of NG tube and Tracheostomy tube in good position. RUL consolidation without change from previous films. New  Possible small pleural effusion on L.    PULMONARY  A: Acute respiratory failure due to RUL necrotizing PNA COPD Bilateral infiltrates, ARDS P:  Maintain plat less 30, maintain current TV Diurese as tolerated Began high PS weaning on 10/1 Upright position  CARDIOVASCULAR  A: Shock, septic, resolved Mild cardiomyopathy with WMAs - suspect ischemic Hypertension P:   ASA on hold, low hgb Cont scheduled metoprolol Cont PRN metoprolol to maintain HR < 115 Cont PRN hydralazine to maintain SBP < 170 mmHg Lasix ordered, I remains >> O, may need to adjust  RENAL  A: Acute renal insufficiency, resolved P: Free water 300cc q4 via tube, decrease 10/1 Follow bmp  Cont D5W to kvo Continue lasix 60mg  q6h  GASTROINTESTINAL  A: Protein calorie malnutrition  01/31/13 - diarrhea + (C.diff negative) P:  Continue TFs  Cont SUP Imodium PEG requested, IR will perform when he completes rx for PNA  HEMATOLOGIC  A: Myelodysplastic syndrome  Anemia , chronic and critical illness- on aranesp as outpt. S/p PRBC 01/29/13 Thrombocytopenia 01/30/13 - 50% drop on day 6  . Suspected HI -low STarted DTI 01/30/13 HIT negative P:  Monitor CBC Received transfusion 1 unit 9/27 and 9/29 for critical care induced anemia superimposed on myelodysplastic syndrome. May have element of hemodilution  INFECTIOUS  A: Septic shock - resolved 01/24/13 Necrotizing PNA, NOS P:  Continue unasyn Establish total abx duration 14 days (started 9/21)  ENDOCRINE  A: Hyperglycemia without prior hx of  DM Chronic steroid use-->stress dose steroids off.  P:  Continue SSI  NEUROLOGIC  A: Seizure disorder  EtOH abuse Hx Deconditioning P:  Fent gtt dc, to intermittent Cont phenytoin - cont IV due to convenience and binding interaction on po route Pt / ot  Levy Pupa, MD, PhD 02/06/2013, 1:44 PM [facility] [phone] or if no answer [phone]

## 2013-02-06 NOTE — Progress Notes (Signed)
Dr. Delton Coombes on unit and made aware of patient pulling PICC line out

## 2013-02-06 NOTE — Progress Notes (Signed)
Pt has a patent #6 shiley (still sutured in place) witht the cuff inflated to MOV, secure and in place. Spare trach, obturator and ambu bag at [facility]. Pt failed SBT due to high RR (upper 40's low 50's) and high Ve. Upon entering the room RT found pt had maneuvered out of his mitts and had pulled out his PICC line and condom cath. The pt had minimal bleeding from the PICC site. RN was immediately notified. No other complications noted. RT will monitor.

## 2013-02-07 LAB — BASIC METABOLIC PANEL
BUN: 49 mg/dL — ABNORMAL HIGH (ref 6–23)
CO2: 33 mEq/L — ABNORMAL HIGH (ref 19–32)
Chloride: 101 mEq/L (ref 96–112)
Creatinine, Ser: 0.87 mg/dL (ref 0.50–1.35)
Glucose, Bld: 128 mg/dL — ABNORMAL HIGH (ref 70–99)

## 2013-02-07 LAB — GLUCOSE, CAPILLARY
Glucose-Capillary: 100 mg/dL — ABNORMAL HIGH (ref 70–99)
Glucose-Capillary: 122 mg/dL — ABNORMAL HIGH (ref 70–99)
Glucose-Capillary: 127 mg/dL — ABNORMAL HIGH (ref 70–99)
Glucose-Capillary: 113 mg/dL — ABNORMAL HIGH (ref 70–99)
Glucose-Capillary: 129 mg/dL — ABNORMAL HIGH (ref 70–99)

## 2013-02-07 LAB — CBC
HCT: 24.1 % — ABNORMAL LOW (ref 39.0–52.0)
Hemoglobin: 7.7 g/dL — ABNORMAL LOW (ref 13.0–17.0)
MCHC: 32 g/dL (ref 30.0–36.0)
Platelets: 110 10*3/uL — ABNORMAL LOW (ref 150–400)
WBC: 15.8 10*3/uL — ABNORMAL HIGH (ref 4.0–10.5)

## 2013-02-07 NOTE — Progress Notes (Signed)
Patient ID: Adrian [name], male   DOB: Apr 16, 1951, 62 y.o.   MRN: [id number]   IR still monitoring pt for perc G tube placement.  Wbc 15.8 today (22.3) T 99.1  Need afeb x 48 hrs Tentative plan for probable G tube Mon

## 2013-02-07 NOTE — Progress Notes (Signed)
NUTRITION FOLLOW UP / CONSULT  DOCUMENTATION CODES  Per approved criteria   -Severe malnutrition in the context of chronic illness    Intervention:    Conitnue Vital AF 1.2 @ 70 ml/h to provide 2016 kcals, 126 gm protein, 1362 ml free water daily.  Nutrition Dx:   Malnutrition related to poor PO intake, SOB as evidenced by physical exam. Ongoing.  Goal:   Intake to meet >90% of estimated nutrition needs, met.   Monitor:   TF tolerance/adequacy, weight trend, labs, vent status.  Assessment:   Patient admitted with shortness of breath. Pt with PMHx of COPD, MDS, and renal insufficiency. Pt found to have sepsis r/t to pneumonia and NSTEMI. Transferred to ICU 9/17 afternoon with respiratory decompensation requiring intubation. Patient is severely malnourished.   TF initiated 9/17. Electrolytes are WNL.   Patient is currently intubated on ventilator support. S/p trach. MV: 13.1 L/min Temp:Temp (24hrs), Avg:98.7 F (37.1 C), Min:98.3 F (36.8 C), Max:99.1 F (37.3 C)  Propofol: none  Full scope of care per Palliative care consult.  Patient has NG tube in place. Vital AF 1.2 is infusing @ 70 ml/hr.  Awaiting PEG placement.  Per IR note, possible Monday.   Free water flushes: 200 ml every 6 hrs providing 800 mL of free water.  Residuals: 5-10 mL  Wt stable at 140 lbs for several days.  Note admission wt was 116 lbs.   Pt denies questions or complaint r/t to TF or nutrition status.    Height: Ht Readings from Last 1 Encounters:  02/04/13 6' (1.829 m)    Weight Status:  Trending up with fluids Wt Readings from Last 1 Encounters:  02/06/13 140 lb 6.9 oz (63.7 kg)  01/23/13  116 lb 13.5 oz (53 kg)   Re-estimated needs:  Kcal: 2000 Protein: 85-105 gm Fluid: 1.8-2 L  Skin: no issues noted  Diet Order:  NPO  TF Order: Vital AF 1.2 at 70 ml/h providing 2016 kcals, 126 gm protein, 1362 ml free water daily.   Intake/Output Summary (Last 24 hours) at 02/07/13  1115 Last data filed at 02/07/13 0700  Gross per 24 hour  Intake 1273.33 ml  Output    700 ml  Net 573.33 ml    Last BM: 10/1  Labs:   Recent Labs Lab 02/01/13 0300  02/03/13 0500 02/04/13 0500 02/07/13 0525  NA 151*  < > 140 140 145  K 4.6  < > 3.7 4.2 3.9  CL 114*  < > 100 98 101  CO2 28  < > 32 32 33*  BUN 45*  < > 40* 40* 49*  CREATININE 0.73  < > 0.67 0.75 0.87  CALCIUM 8.0*  < > 7.7* 7.9* 8.9  MG 2.0  --   --   --   --   PHOS 4.0  --   --   --   --   GLUCOSE 120*  < > 245* 139* 128*  < > = values in this interval not displayed.  CBG (last 3)   Recent Labs  02/07/13 0105 02/07/13 0518 02/07/13 0734  GLUCAP 100* 122* 127*    Scheduled Meds: . antiseptic oral rinse  15 mL Mouth Rinse QID  . budesonide (PULMICORT) nebulizer solution  0.25 mg Nebulization Q6H  . chlorhexidine  15 mL Mouth Rinse BID  . darbepoetin (ARANESP) injection - NON-DIALYSIS  100 mcg Subcutaneous Q Wed-1800  . folic acid  1 mg Per Tube Daily  . free water  200 mL Per Tube Q6H  . furosemide  60 mg Intravenous Q6H  . insulin aspart  0-15 Units Subcutaneous Q4H  . ipratropium  0.5 mg Nebulization Q6H  . levalbuterol  0.63 mg Nebulization Q6H  . loperamide  4 mg Oral Q8H  . metoprolol tartrate  25 mg Per Tube BID  . pantoprazole sodium  40 mg Per Tube Q1200  . phenytoin (DILANTIN) IV  200 mg Intravenous Q12H  . potassium chloride  40 mEq Oral Q12H  . sodium chloride  10-40 mL Intracatheter Q12H    Continuous Infusions: . sodium chloride 10 mL/hr (02/07/13 0534)  . dextrose 10 mL (02/04/13 1859)  . feeding supplement (VITAL AF 1.2 CAL) 1,000 mL (02/07/13 0100)    Loyce Dys, MS RD LDN Clinical Inpatient Dietitian Pager: [phone] Weekend/After hours pager: [phone]

## 2013-02-07 NOTE — Progress Notes (Signed)
PT Cancellation Note  Patient Details Name: Adrian [name] MRN: [id number] DOB: 08-23-1950   Cancelled Treatment:    Reason Eval/Treat Not Completed: Medical issues which prohibited therapy (Pt currently weaning.  Respiratory request to hold. )   [name] 02/07/2013, 2:15 PM  Jake Shark, PT DPT [phone]

## 2013-02-07 NOTE — Progress Notes (Signed)
Pt currently weaning on CP/PSV 5/18 and is tolerating well at this time. No complications noted. RT will monitor.

## 2013-02-07 NOTE — Progress Notes (Signed)
Pt has a patent #6 shiley (still sutured in place) with the cuff inflated to MOV, secure and in place. Spare trach, obturator and ambu bag at [facility]. Pt failed SBT due to high RR (upper 50's). Pt remains on full support at this time. No complications noted. RT will monitor.

## 2013-02-07 NOTE — Progress Notes (Signed)
PULMONARY  / CRITICAL CARE MEDICINE  Name: ATREYU MAK MRN: [id number] DOB: March 11, 1951  PCP [name], MD    ADMISSION DATE: 01/23/2013  CONSULTATION DATE: 02/04/2013  LOS 15 days   REFERRING MD : Serita Kyle, MD  PRIMARY SERVICE:  PCCM   CHIEF COMPLAINT: No complaints  BRIEF PATIENT DESCRIPTION: 62 y/o male with PMH ETOH, COPD, anemia, myelodysplastic syndrome (gets iron infusion at cancer center) admitted 9/17 with large RUL PNA. Decompensated on BiPAP. PCCM was consulted 9/17 for intubation and continued care in ICU.   LINES / TUBES:  R rad a-line 9/17 >> 9/20 ETT 9/17 >>9/28 Trach (jy 9/28>>> L IJ CVL9/17 >> out PICC > 10/1  CULTURES:  MRSA PCR 9/17>>> NEG Strep Ag 9/17>>> NEG  Legionella Ag 9/17 >> NEG Urine 9/17 >>  NEG Resp 9/17 >> NOF Blood 9/17 >> neg cdiff 9/27>>>neg  ANTIBIOTICS:  Ceftriaxone 9/17 >>> 9/18 Azithromycin 9/17 >> 9/20 Vanc 9/18 >> 9/21 Cefepime 9/18  >> 9/21 Unasyn 9/21 >>>   SIGNIFICANT EVENTS / STUDIES:  9/17 admitted by [facility] with dx of PNA. BiPAP initiated 9/17 Progressive resp failure. PCCM consulted. intubated   9/17 ECHO:  Mild LVH, EF 45-50%, grade 1 diastolic dysfunction, Septal dyssynergy. Akinesis base inferior wall. Severe hypokinesis inferolateral wall 9/18 CT w/o Contrast: Dense consolidation consistent  necrotizing PNA in RUL, interstitial PNA throughout R and L lower lobes, increase IS and AS dz in RLL 9/19 RASS 0. + F/C. Still requiring PEEP 8 and FiO2 50% 9/20 Change to PCV mode on vent to enhance comfort and synchrony 01/27/13:  Intubated. RASS 0. + F/C. Purulent ET secretions persist 01/28/13: On 70% fio2, peep 8, RR 18, PCV  and has significnat air trapping and vent dysonchrony despite sedation. RN reporting foul smelling secretions 01/29/13:  No change. 70% fio2/ 10 peep. Foul smelling et tube secretions +. Agitated and needed diprivan last night. Now doing ok on fent gtt 9/28 Trach placed. Fi02 50%, Peep 8.     SUBJECTIVE/OVERNIGHT/INTERVAL HX Had to be restrained last pm Tolerating some high PSV this am  VITAL SIGNS: Temp:  [98.3 F (36.8 C)-99.1 F (37.3 C)] 98.6 F (37 C) (10/02 1140) Pulse Rate:  [99-124] 107 (10/02 1220) Resp:  [21-28] 22 (10/02 1140) BP: (118-154)/(60-106) 122/66 mmHg (10/02 1220) SpO2:  [92 %-100 %] 92 % (10/02 1220) FiO2 (%):  [30 %] 30 % (10/02 1220)  PHYSICAL EXAMINATION: General:  Trach in place, Alert responds to commands appropriately Neuro:  RASS 0, follows commands, some agitation HEENT:  NCAT, WNL Neck:  Supple, CVL site clean Cardiovascular: RRR, no m/r/g Lungs: diffuse rhonchi, diminished sounds in RUL Abdomen:  Soft, Nontender, + BS  Ext:  SCD in place bilaterally, +1 edema at ankles, warm without rash   PULMONARY  Recent Labs Lab 02/01/13 0900 02/03/13 0408  PHART 7.377 7.424  PCO2ART 49.7* 52.1*  PO2ART 78.5* 73.0*  HCO3 28.5* 34.1*  TCO2 30.1 36  O2SAT 96.3 95.0    CBC  Recent Labs Lab 02/04/13 0530  02/06/13 02/06/13 1345 02/07/13 0915  HGB 6.5*  < > 8.9* 7.6* 7.7*  HCT 19.9*  < > 25.9* 23.2* 24.1*  WBC 25.4*  --  22.3*  --  15.8*  PLT 83*  --  88*  --  110*  < > = values in this interval not displayed.  COAGULATION  Recent Labs Lab 02/03/13 0500  INR 1.37    CARDIAC   No results found for this basename:  TROPONINI,  in the last 168 hours No results found for this basename: PROBNP,  in the last 168 hours   CHEMISTRY  Recent Labs Lab 02/01/13 0300 02/02/13 0500 02/03/13 0500 02/04/13 0500 02/07/13 0525  NA 151* 147* 140 140 145  K 4.6 3.9 3.7 4.2 3.9  CL 114* 108 100 98 101  CO2 28 31 32 32 33*  GLUCOSE 120* 114* 245* 139* 128*  BUN 45* 42* 40* 40* 49*  CREATININE 0.73 0.71 0.67 0.75 0.87  CALCIUM 8.0* 8.0* 7.7* 7.9* 8.9  MG 2.0  --   --   --   --   PHOS 4.0  --   --   --   --    Estimated Creatinine Clearance: 77.3 ml/min (by C-G formula based on Cr of 0.87).   LIVER  Recent Labs Lab  02/02/13 0500 02/03/13 0500  AST 20 18  ALT 9 10  ALKPHOS 90 82  BILITOT 0.3 0.3  PROT 6.8 6.5  ALBUMIN 1.5* 1.4*  INR  --  1.37   ENDOCRINE CBG (last 3)   Recent Labs  02/07/13 0518 02/07/13 0734 02/07/13 1242  GLUCAP 122* 127* 113*    IMAGING x48h  CXR 9/28 showing correct placement of NG tube and Tracheostomy tube in good position. RUL consolidation without change from previous films. New  Possible small pleural effusion on L.    PULMONARY  A: Acute respiratory failure due to RUL necrotizing PNA COPD Bilateral infiltrates, ARDS P:  Maintain plat less 30, maintain current TV Diurese as tolerated Began high PS weaning on 10/1 Upright position  CARDIOVASCULAR  A: Shock, septic, resolved Mild cardiomyopathy with WMAs - suspect ischemic Hypertension P:   ASA on hold, low hgb Cont scheduled metoprolol Cont PRN metoprolol to maintain HR < 115 Cont PRN hydralazine to maintain SBP < 170 mmHg Lasix ordered, I remains >> O, may need to adjust  RENAL  A: Acute renal insufficiency, resolved P: Free water 300cc q4 via tube, decreased 10/1 Follow bmp  Cont D5W to kvo Continue lasix 60mg  q6h  GASTROINTESTINAL  A: Protein calorie malnutrition  01/31/13 - diarrhea + (C.diff negative) P:  Continue TFs  Cont SUP Imodium PEG requested, IR will perform when he completes rx for PNA / no longer febrile  HEMATOLOGIC  A: Myelodysplastic syndrome  Anemia , chronic and critical illness- on aranesp as outpt. S/p PRBC 01/29/13 Thrombocytopenia 01/30/13 - 50% drop on day 6  . Suspected HI -low STarted DTI 01/30/13 HIT negative P:  Monitor CBC Received transfusion 1 unit 9/27 and 9/29 for critical care induced anemia superimposed on myelodysplastic syndrome. May have element of hemodilution  INFECTIOUS  A: Septic shock - resolved 01/24/13 Necrotizing PNA, NOS P:  Continue unasyn Plan total abx duration 14 days; (day 12 on 02/07/13)  ENDOCRINE  A: Hyperglycemia  without prior hx of DM Chronic steroid use-->stress dose steroids off.  P:  Continue SSI  NEUROLOGIC  A: Seizure disorder  EtOH abuse Hx Deconditioning P:  Fent gtt dc, to intermittent Cont phenytoin - cont IV due to convenience and binding interaction on po route Pt / ot  Levy Pupa, MD, PhD 02/07/2013, 1:29 PM [facility] [phone] or if no answer [phone]

## 2013-02-07 NOTE — Progress Notes (Signed)
Pt placed back on full support due to RT found pt with a RR in the high 40's upon entering the room to give neb TX. Pt tolerating well on full support. RT will monitor.

## 2013-02-08 DIAGNOSIS — Z930 Tracheostomy status: Secondary | ICD-10-CM

## 2013-02-08 LAB — GLUCOSE, CAPILLARY
Glucose-Capillary: 110 mg/dL — ABNORMAL HIGH (ref 70–99)
Glucose-Capillary: 122 mg/dL — ABNORMAL HIGH (ref 70–99)
Glucose-Capillary: 128 mg/dL — ABNORMAL HIGH (ref 70–99)
Glucose-Capillary: 129 mg/dL — ABNORMAL HIGH (ref 70–99)

## 2013-02-08 LAB — CBC
Hemoglobin: 7 g/dL — ABNORMAL LOW (ref 13.0–17.0)
MCHC: 33.3 g/dL (ref 30.0–36.0)
RBC: 2 MIL/uL — ABNORMAL LOW (ref 4.22–5.81)
RDW: 22.4 % — ABNORMAL HIGH (ref 11.5–15.5)
WBC: 12.1 10*3/uL — ABNORMAL HIGH (ref 4.0–10.5)

## 2013-02-08 MED ORDER — SODIUM CHLORIDE 0.9 % IJ SOLN
3.0000 mL | Freq: Two times a day (BID) | INTRAMUSCULAR | Status: DC
  Administered 2013-02-09 – 2013-02-12 (×7): 3 mL via INTRAVENOUS

## 2013-02-08 NOTE — Progress Notes (Signed)
Patient ID: Adrian [name], male   DOB: 05-06-1951, 62 y.o.   MRN: [id number]   On IR radar for percutaneous gastric tube  T max 100.9; now 98.9 Wbc pending  prob tentative for Mon  IR PA will see over weekend

## 2013-02-08 NOTE — Progress Notes (Signed)
Physical Therapy Treatment Patient Details Name: Adrian [name] MRN: [id number] DOB: Oct 28, 1950 Today's Date: 02/08/2013 Time: 4098-1191 PT Time Calculation (min): 24 min  PT Assessment / Plan / Recommendation  History of Present Illness  62 y/o male with PMH ETOH, COPD, anemia, myelodysplastic syndrome (gets iron infusion at cancer center) admitted 9/17 with large RUL PNA. Decompensated on BiPAP. PCCM was consulted 9/17 for intubation and continued care in ICU.    PT Comments   Pt continues to be limited due to fatigue and increase RR on trach/vent with mobility.  Pt on CPAP settings prior to mobility however RN placed on 100% settings during mobility.  Pt very agreeable to sit EOB however limited due to BM incontinence.  Pt was returned to supine and needed total (A) to perform pericare.   Follow Up Recommendations  LTACH     Equipment Recommendations  Other (comment) (TBD at next venue of care)    Frequency Min 3X/week   Progress towards PT Goals Progress towards PT goals: Progressing toward goals (slowly)  Plan Current plan remains appropriate    Precautions / Restrictions Precautions Precautions: Fall Precaution Comments: trach/vent   Pertinent Vitals/Pain No c/o pain    Mobility  Bed Mobility Bed Mobility: Supine to Sit;Sit to Supine Rolling Right: 2: Max assist;With rail Rolling Left: 2: Max assist;With rail Supine to Sit: 1: +2 Total assist Supine to Sit: Patient Percentage: 50% Sitting - Scoot to Edge of Bed: 2: Max assist Sit to Supine: 1: +2 Total assist Sit to Supine: Patient Percentage: 40% Details for Bed Mobility Assistance: (A) to elevate trunk and LE OOB with cues for technique.  Transfers Transfers: Not assessed Ambulation/Gait Ambulation/Gait Assistance: Not tested (comment)    Exercises     PT Diagnosis:    PT Problem List:   PT Treatment Interventions:     PT Goals (current goals can now be found in the care plan section) Acute Rehab PT  Goals Patient Stated Goal: Unable to state PT Goal Formulation: Patient unable to participate in goal setting Time For Goal Achievement: 02/14/13 Potential to Achieve Goals: Fair  Visit Information  Last PT Received On: 02/08/13 Assistance Needed: +2 History of Present Illness:  62 y/o male with PMH ETOH, COPD, anemia, myelodysplastic syndrome (gets iron infusion at cancer center) admitted 9/17 with large RUL PNA. Decompensated on BiPAP. PCCM was consulted 9/17 for intubation and continued care in ICU.     Subjective Data  Subjective: Pt in supine alert and intubated Patient Stated Goal: Unable to state   Cognition  Cognition Arousal/Alertness: Awake/alert Behavior During Therapy: WFL for tasks assessed/performed Overall Cognitive Status: Difficult to assess    Balance  Balance Balance Assessed: Yes Static Sitting Balance Static Sitting - Balance Support: Bilateral upper extremity supported;Feet supported Static Sitting - Level of Assistance: 4: Min assist Static Sitting - Comment/# of Minutes: (A) to maintain balance and prevent posterior lean.  Pt ablet to progress to intermittent minguard  End of Session PT - End of Session Equipment Utilized During Treatment:  (trach/vent) Activity Tolerance: Patient limited by fatigue (increased RR ) Patient left: in bed;with call bell/phone within reach Nurse Communication: Mobility status   GP     Shahidah Nesbitt 02/08/2013, 11:31 AM  Jake Shark, PT DPT [phone]

## 2013-02-08 NOTE — Progress Notes (Signed)
PULMONARY  / CRITICAL CARE MEDICINE  Name: Adrian [name] MRN: [id number] DOB: 11-04-1950  PCP [name], MD    ADMISSION DATE: 01/23/2013  CONSULTATION DATE: 02/04/2013  LOS 16 days   REFERRING MD : Serita Kyle, MD  PRIMARY SERVICE:  PCCM   CHIEF COMPLAINT: No complaints  BRIEF PATIENT DESCRIPTION: 62 y/o male with PMH ETOH, COPD, anemia, myelodysplastic syndrome (gets iron infusion at cancer center) admitted 9/17 with large RUL PNA. Decompensated on BiPAP. PCCM was consulted 9/17 for intubation and continued care in ICU.   LINES / TUBES:  R rad a-line 9/17 >> 9/20 ETT 9/17 >>9/28 Trach (jy 9/28>>> L IJ CVL9/17 >> out PICC > 10/1  CULTURES:  MRSA PCR 9/17>>> NEG Strep Ag 9/17>>> NEG  Legionella Ag 9/17 >> NEG Urine 9/17 >>  NEG Resp 9/17 >> NOF Blood 9/17 >> neg cdiff 9/27>>>neg  ANTIBIOTICS:  Ceftriaxone 9/17 >>> 9/18 Azithromycin 9/17 >> 9/20 Vanc 9/18 >> 9/21 Cefepime 9/18  >> 9/21 Unasyn 9/21 >>>   SIGNIFICANT EVENTS / STUDIES:  9/17 admitted by [facility] with dx of PNA. BiPAP initiated 9/17 Progressive resp failure. PCCM consulted. intubated   9/17 ECHO:  Mild LVH, EF 45-50%, grade 1 diastolic dysfunction, Septal dyssynergy. Akinesis base inferior wall. Severe hypokinesis inferolateral wall 9/18 CT w/o Contrast: Dense consolidation consistent  necrotizing PNA in RUL, interstitial PNA throughout R and L lower lobes, increase IS and AS dz in RLL 9/19 RASS 0. + F/C. Still requiring PEEP 8 and FiO2 50% 9/20 Change to PCV mode on vent to enhance comfort and synchrony 01/27/13:  Intubated. RASS 0. + F/C. Purulent ET secretions persist 01/28/13: On 70% fio2, peep 8, RR 18, PCV  and has significnat air trapping and vent dysonchrony despite sedation. RN reporting foul smelling secretions 01/29/13:  No change. 70% fio2/ 10 peep. Foul smelling et tube secretions +. Agitated and needed diprivan last night. Now doing ok on fent gtt 9/28 Trach placed. Fi02 50%, Peep 8.     SUBJECTIVE/OVERNIGHT/INTERVAL HX Had to be restrained last pm Tolerating some high PSV this am  VITAL SIGNS: Temp:  [98.4 F (36.9 C)-100.9 F (38.3 C)] 99 F (37.2 C) (10/03 1149) Pulse Rate:  [102-120] 102 (10/03 1149) Resp:  [12-28] 13 (10/03 1149) BP: (105-146)/(58-71) 118/65 mmHg (10/03 1149) SpO2:  [93 %-100 %] 96 % (10/03 1149) FiO2 (%):  [30 %] 30 % (10/03 1204) Weight:  [59.5 kg (131 lb 2.8 oz)] 59.5 kg (131 lb 2.8 oz) (10/03 0500)  PHYSICAL EXAMINATION: General:  Trach in place, Alert responds to commands appropriately Neuro:  RASS 0, follows commands, some agitation HEENT:  NCAT, WNL Neck:  Supple, CVL site clean Cardiovascular: RRR, no m/r/g Lungs: diffuse rhonchi, diminished sounds in RUL Abdomen:  Soft, Nontender, + BS  Ext:  SCD in place bilaterally, +1 edema at ankles, warm without rash   PULMONARY  Recent Labs Lab 02/03/13 0408  PHART 7.424  PCO2ART 52.1*  PO2ART 73.0*  HCO3 34.1*  TCO2 36  O2SAT 95.0    CBC  Recent Labs Lab 02/06/13 02/06/13 1345 02/07/13 0915 02/08/13 0915  HGB 8.9* 7.6* 7.7* 7.0*  HCT 25.9* 23.2* 24.1* 21.0*  WBC 22.3*  --  15.8* 12.1*  PLT 88*  --  110* 132*    COAGULATION  Recent Labs Lab 02/03/13 0500  INR 1.37    CARDIAC   No results found for this basename: TROPONINI,  in the last 168 hours No results found for this basename:  PROBNP,  in the last 168 hours  CHEMISTRY  Recent Labs Lab 02/02/13 0500 02/03/13 0500 02/04/13 0500 02/07/13 0525  NA 147* 140 140 145  K 3.9 3.7 4.2 3.9  CL 108 100 98 101  CO2 31 32 32 33*  GLUCOSE 114* 245* 139* 128*  BUN 42* 40* 40* 49*  CREATININE 0.71 0.67 0.75 0.87  CALCIUM 8.0* 7.7* 7.9* 8.9   Estimated Creatinine Clearance: 72.2 ml/min (by C-G formula based on Cr of 0.87).  LIVER  Recent Labs Lab 02/02/13 0500 02/03/13 0500  AST 20 18  ALT 9 10  ALKPHOS 90 82  BILITOT 0.3 0.3  PROT 6.8 6.5  ALBUMIN 1.5* 1.4*  INR  --  1.37   ENDOCRINE CBG  (last 3)   Recent Labs  02/08/13 0437 02/08/13 0815 02/08/13 1249  GLUCAP 110* 122* 119*    IMAGING x48h  CXR 9/28 showing correct placement of NG tube and Tracheostomy tube in good position. RUL consolidation without change from previous films. New  Possible small pleural effusion on L.   PULMONARY  A: Acute respiratory failure due to RUL necrotizing PNA COPD Bilateral infiltrates, ARDS P:  - Maintain plat less 30, maintain current TV. - Diurese as tolerated - Continue high PS weaning on 10/1 - Upright position - Needs LTAC placement.  CARDIOVASCULAR  A: Shock, septic, resolved Mild cardiomyopathy with WMAs - suspect ischemic Hypertension P:   - ASA on hold, low hgb. - Cont scheduled metoprolol. - Cont PRN metoprolol to maintain HR < 115. - Cont PRN hydralazine to maintain SBP < 170 mmHg. - Lasix ordered, -1 liter yesterday, will continue current dosage.  RENAL  A: Acute renal insufficiency, resolved P: - Free water 300cc q4 via tube, will recheck BMET, if Na is dropping may have to decrease. - Follow bmp. - KVO IVF. - Continue lasix 60mg  q6h, will likely need to decrease in AM pending BMET results.  GASTROINTESTINAL  A: Protein calorie malnutrition  01/31/13 - diarrhea + (C.diff negative) P:  - Continue TFs. - Cont SUP. - Imodium. - PEG requested, IR will perform when he completes rx for PNA / no longer febrile.  HEMATOLOGIC  A: Myelodysplastic syndrome  Anemia , chronic and critical illness- on aranesp as outpt. S/p PRBC 01/29/13 Thrombocytopenia 01/30/13 - 50% drop on day 6  . Suspected HI -low STarted DTI 01/30/13 HIT negative P:  - Monitor CBC - Received transfusion 1 unit 9/27 and 9/29 for critical care induced anemia superimposed on myelodysplastic syndrome.  INFECTIOUS  A: Septic shock - resolved 01/24/13 Necrotizing PNA, NOS P:  - Continue unasyn - Plan total abx duration 14 days; (day 13 on 02/08/13)  ENDOCRINE  A: Hyperglycemia  without prior hx of DM Chronic steroid use-->stress dose steroids off.  P:  - Continue SSI.  NEUROLOGIC  A: Seizure disorder  EtOH abuse Hx Deconditioning P:  - Intermittent fentanyl for pain, decrease sedation as able. - Cont phenytoin - cont IV due to convenience and binding interaction on po route. - PT/OT.  Alyson Reedy, M.D. [facility] Pulmonary/Critical Care Medicine. Pager: [phone]. After hours pager: [phone].

## 2013-02-09 LAB — COMPREHENSIVE METABOLIC PANEL
ALT: 22 U/L (ref 0–53)
Alkaline Phosphatase: 78 U/L (ref 39–117)
CO2: 31 mEq/L (ref 19–32)
Calcium: 8.6 mg/dL (ref 8.4–10.5)
GFR calc Af Amer: 74 mL/min — ABNORMAL LOW (ref >90)
GFR calc non Af Amer: 63 mL/min — ABNORMAL LOW (ref >90)
Glucose, Bld: 123 mg/dL — ABNORMAL HIGH (ref 70–99)
Potassium: 4.1 mEq/L (ref 3.5–5.1)
Sodium: 146 mEq/L — ABNORMAL HIGH (ref 135–145)
Total Bilirubin: 0.2 mg/dL — ABNORMAL LOW (ref 0.3–1.2)

## 2013-02-09 LAB — GLUCOSE, CAPILLARY
Glucose-Capillary: 114 mg/dL — ABNORMAL HIGH (ref 70–99)
Glucose-Capillary: 122 mg/dL — ABNORMAL HIGH (ref 70–99)
Glucose-Capillary: 120 mg/dL — ABNORMAL HIGH (ref 70–99)

## 2013-02-09 LAB — CBC
Hemoglobin: 6.9 g/dL — CL (ref 13.0–17.0)
MCH: 34.8 pg — ABNORMAL HIGH (ref 26.0–34.0)
MCV: 106.1 fL — ABNORMAL HIGH (ref 78.0–100.0)
RBC: 1.98 MIL/uL — ABNORMAL LOW (ref 4.22–5.81)

## 2013-02-09 LAB — HEMOGLOBIN AND HEMATOCRIT, BLOOD: HCT: 23.3 % — ABNORMAL LOW (ref 39.0–52.0)

## 2013-02-09 NOTE — Progress Notes (Signed)
PULMONARY  / CRITICAL CARE MEDICINE  Name: Adrian [name] MRN: [id number] DOB: March 27, 1951  PCP [name], MD    ADMISSION DATE: 01/23/2013  CONSULTATION DATE: 02/04/2013  LOS 17 days   REFERRING MD : Serita Kyle, MD  PRIMARY SERVICE:  PCCM   CHIEF COMPLAINT: No complaints  BRIEF PATIENT DESCRIPTION: 62 y/o male with PMH ETOH, COPD, anemia, myelodysplastic syndrome (gets iron infusion at cancer center) admitted 9/17 with large RUL PNA. Decompensated on BiPAP. PCCM was consulted 9/17 for intubation and continued care in ICU.   LINES / TUBES:  R rad a-line 9/17 >> 9/20 ETT 9/17 >>9/28 Trach (jy 9/28>>> L IJ CVL9/17 >> out PICC > 10/1  CULTURES:  MRSA PCR 9/17>>> NEG Strep Ag 9/17>>> NEG  Legionella Ag 9/17 >> NEG Urine 9/17 >>  NEG Resp 9/17 >> NOF Blood 9/17 >> neg cdiff 9/27>>>neg  ANTIBIOTICS:  Ceftriaxone 9/17 >>> 9/18 Azithromycin 9/17 >> 9/20 Vanc 9/18 >> 9/21 Cefepime 9/18  >> 9/21 Unasyn 9/21 >>>   SIGNIFICANT EVENTS / STUDIES:  9/17 admitted by [facility] with dx of PNA. BiPAP initiated 9/17 Progressive resp failure. PCCM consulted. intubated   9/17 ECHO:  Mild LVH, EF 45-50%, grade 1 diastolic dysfunction, Septal dyssynergy. Akinesis base inferior wall. Severe hypokinesis inferolateral wall 9/18 CT w/o Contrast: Dense consolidation consistent  necrotizing PNA in RUL, interstitial PNA throughout R and L lower lobes, increase IS and AS dz in RLL 9/19 RASS 0. + F/C. Still requiring PEEP 8 and FiO2 50% 9/20 Change to PCV mode on vent to enhance comfort and synchrony 01/27/13:  Intubated. RASS 0. + F/C. Purulent ET secretions persist 01/28/13: On 70% fio2, peep 8, RR 18, PCV  and has significnat air trapping and vent dysonchrony despite sedation. RN reporting foul smelling secretions 01/29/13:  No change. 70% fio2/ 10 peep. Foul smelling et tube secretions +. Agitated and needed diprivan last night. Now doing ok on fent gtt 9/28 Trach placed. Fi02 50%, Peep 8.     SUBJECTIVE/OVERNIGHT/INTERVAL HX Had to be restrained last pm Tolerating some high PSV this am  VITAL SIGNS: Temp:  [98.2 F (36.8 C)-99.5 F (37.5 C)] 99.5 F (37.5 C) (10/04 0811) Pulse Rate:  [89-117] 110 (10/04 0811) Resp:  [13-37] 28 (10/04 0811) BP: (95-122)/(52-74) 105/59 mmHg (10/04 0811) SpO2:  [91 %-100 %] 100 % (10/04 0811) FiO2 (%):  [30 %-40 %] 40 % (10/04 0804)  Scheduled MEDS>  . antiseptic oral rinse  15 mL Mouth Rinse QID  . budesonide (PULMICORT) nebulizer solution  0.25 mg Nebulization Q6H  . chlorhexidine  15 mL Mouth Rinse BID  . darbepoetin (ARANESP) injection - NON-DIALYSIS  100 mcg Subcutaneous Q Wed-1800  . folic acid  1 mg Per Tube Daily  . free water  200 mL Per Tube Q6H  . furosemide  60 mg Intravenous Q6H  . insulin aspart  0-15 Units Subcutaneous Q4H  . ipratropium  0.5 mg Nebulization Q6H  . levalbuterol  0.63 mg Nebulization Q6H  . loperamide  4 mg Oral Q8H  . metoprolol tartrate  25 mg Per Tube BID  . pantoprazole sodium  40 mg Per Tube Q1200  . phenytoin (DILANTIN) IV  200 mg Intravenous Q12H  . potassium chloride  40 mEq Oral Q12H  . sodium chloride  3 mL Intravenous Q12H   PHYSICAL EXAMINATION: General:  Trach in place, Alert responds to commands appropriately Neuro:  RASS 0, follows commands, some agitation HEENT:  NCAT, WNL Neck:  Supple,  CVL site clean Cardiovascular: RRR, no m/r/g Lungs: diffuse rhonchi, diminished sounds in RUL Abdomen:  Soft, Nontender, + BS  Ext:  SCD in place bilaterally, +1 edema at ankles, warm without rash   Recent Labs Lab 02/03/13 0408  PHART 7.424  PCO2ART 52.1*  PO2ART 73.0*  HCO3 34.1*  TCO2 36  O2SAT 95.0    CBC  Recent Labs Lab 02/07/13 0915 02/08/13 0915 02/09/13 0822  HGB 7.7* 7.0* 6.9*  HCT 24.1* 21.0* 21.0*  WBC 15.8* 12.1* 13.1*  PLT 110* 132* 159    COAGULATION  Recent Labs Lab 02/03/13 0500  INR 1.37    CARDIAC   No results found for this basename:  TROPONINI,  in the last 168 hours No results found for this basename: PROBNP,  in the last 168 hours  CHEMISTRY  Recent Labs Lab 02/03/13 0500 02/04/13 0500 02/07/13 0525 02/09/13 0822  NA 140 140 145 146*  K 3.7 4.2 3.9 4.1  CL 100 98 101 104  CO2 32 32 33* 31  GLUCOSE 245* 139* 128* 123*  BUN 40* 40* 49* 65*  CREATININE 0.67 0.75 0.87 1.18  CALCIUM 7.7* 7.9* 8.9 8.6   Estimated Creatinine Clearance: 53.2 ml/min (by C-G formula based on Cr of 1.18).  LIVER  Recent Labs Lab 02/03/13 0500 02/09/13 0822  AST 18 29  ALT 10 22  ALKPHOS 82 78  BILITOT 0.3 0.2*  PROT 6.5 8.0  ALBUMIN 1.4* 1.8*  INR 1.37  --    ENDOCRINE CBG (last 3)   Recent Labs  02/09/13 0043 02/09/13 0419 02/09/13 0809  GLUCAP 114* 122* 123*    IMAGING:  CXR 9/28 showing correct placement of NG tube and Tracheostomy tube in good position. RUL consolidation without change from previous films. New  Possible small pleural effusion on L.  CXR 10/1> continued dense consolidation RUL   PULMONARY  A: Acute respiratory failure due to RUL necrotizing PNA COPD Bilateral infiltrates, ARDS P:  - Maintain plat less 30, maintain current TV. - Diurese as tolerated - Continue high PS weaning on 10/1 - Upright position - Needs LTAC placement.  CARDIOVASCULAR  A: Shock, septic, resolved Mild cardiomyopathy with WMAs - suspect ischemic Hypertension P:   - ASA on hold, low hgb. - Cont scheduled metoprolol. - Cont PRN metoprolol to maintain HR < 115. - Cont PRN hydralazine to maintain SBP < 170 mmHg. - Lasix ordered, -1 liter yesterday, will continue current dosage.  RENAL  A: Acute renal insufficiency, resolved P: - Free water 300cc q4 via tube, will recheck BMET, if Na is dropping may have to decrease. - Follow bmp. - KVO IVF. - Continue lasix 60mg  q6h, will likely need to decrease in AM pending BMET results.  GASTROINTESTINAL  A: Protein calorie malnutrition  01/31/13 - diarrhea +  (C.diff negative) P:  - Continue TFs. - Cont SUP. - Imodium. - PEG requested, IR will perform when he completes rx for PNA / no longer febrile.  HEMATOLOGIC  A: Myelodysplastic syndrome  Anemia , chronic and critical illness- on aranesp as outpt. S/p PRBC 01/29/13 Thrombocytopenia 01/30/13 - 50% drop on day 6  . Suspected HI -low STarted DTI 01/30/13 HIT negative P:  - Monitor CBC => Hg 10/4= 6.9 will Tx 1u today... - Prev received transfusion 1u 9/27 and 9/29 for critical care induced anemia superimposed on myelodysplastic syndrome.  INFECTIOUS  A: Septic shock - resolved 01/24/13 Necrotizing PNA, NOS P:  - Continue unasyn - Plan total abx duration  14 days; (day 13 on 02/08/13)  ENDOCRINE  A: Hyperglycemia without prior hx of DM Chronic steroid use-->stress dose steroids off.  P:  - Continue SSI.  NEUROLOGIC  A: Seizure disorder  EtOH abuse Hx Deconditioning P:  - Intermittent fentanyl for pain, decrease sedation as able. - Cont phenytoin - cont IV due to convenience and binding interaction on po route. - PT/OT.  Lonzo Cloud. Kriste Basque, M.D. [facility] Pulmonary/Critical Care Medicine. After hours pager: [phone].

## 2013-02-09 NOTE — Progress Notes (Signed)
crCRITICAL VALUE ALERT  Critical value received:  Hgb=6.9  Date of notification:  02/09/13  Time of notification:  09:00  Critical value read back:yes  Nurse who received alert:  Levander Campion  MD notified (1st page):  Dr. Lorin Picket  Time of first page:  10:11  MD notified (2nd page):  Time of second page:  Responding MD:  Dr. Lorin Picket  Time MD responded:  10:11

## 2013-02-10 ENCOUNTER — Encounter (HOSPITAL_COMMUNITY): Payer: Self-pay | Admitting: Radiology

## 2013-02-10 DIAGNOSIS — N289 Disorder of kidney and ureter, unspecified: Secondary | ICD-10-CM

## 2013-02-10 LAB — GLUCOSE, CAPILLARY
Glucose-Capillary: 111 mg/dL — ABNORMAL HIGH (ref 70–99)
Glucose-Capillary: 119 mg/dL — ABNORMAL HIGH (ref 70–99)
Glucose-Capillary: 127 mg/dL — ABNORMAL HIGH (ref 70–99)
Glucose-Capillary: 92 mg/dL (ref 70–99)

## 2013-02-10 LAB — TYPE AND SCREEN: Unit division: 0

## 2013-02-10 MED ORDER — CEFAZOLIN SODIUM-DEXTROSE 2-3 GM-% IV SOLR
2.0000 g | Freq: Once | INTRAVENOUS | Status: AC
  Administered 2013-02-11: 2 g via INTRAVENOUS
  Filled 2013-02-10: qty 50

## 2013-02-10 NOTE — Consult Note (Signed)
HPI: Adrian [name] is an 62 y.o. male with resp failure from sepsis/PNA. Chart and hospital course reviewed. Now has tracheostomy and has been recovering. Currently tolerating TF via NGT. IR is requested to place G-tube for more long term nutritional needs. PMHx, chart, meds, labs reviewed. Pt has completed abx course and has now also been afebrile >48hrs. WBC remains elevated but no new or untreated infectious issues.  Past Medical History:  Past Medical History  Diagnosis Date  . Seizures   . Megaloblastic anemia   . Emphysema of lung     Past Surgical History:  Past Surgical History  Procedure Laterality Date  . Abdominal hernia repair      Family History:  Family History  Problem Relation Age of Onset  . Hypertension Mother     Social History:  reports that he has been smoking Cigarettes.  He has a 3.75 pack-year smoking history. He has never used smokeless tobacco. He reports that  drinks alcohol. He reports that he does not use illicit drugs.  Allergies: No Known Allergies  Medications:   Medication List    ASK your doctor about these medications       budesonide-formoterol 160-4.5 MCG/ACT inhaler  Commonly known as:  SYMBICORT  Inhale 2 puffs into the lungs 2 (two) times daily.     cyanocobalamin 500 MCG tablet  Take 500 mcg by mouth daily.     diazepam 5 MG tablet  Commonly known as:  VALIUM  Take 5 mg by mouth daily.     folic acid 400 MCG tablet  Commonly known as:  FOLVITE  Take 400 mcg by mouth daily.     HEMATOGEN FORTE PO  Take 1 tablet by mouth daily.     mometasone 50 MCG/ACT nasal spray  Commonly known as:  NASONEX  Place 2 sprays into the nose daily.     phenytoin 100 MG ER capsule  Commonly known as:  DILANTIN  Take 200 mg by mouth 2 (two) times daily.     predniSONE 20 MG tablet  Commonly known as:  DELTASONE  Take 20-60 mg by mouth daily. Take 60 mg for 4 days, take 40 mg for 4 days take 20 mg for 4 days        Please HPI  for pertinent positives, otherwise complete 10 system ROS negative.  Physical Exam: BP 116/68  Pulse 106  Temp(Src) 98.5 F (36.9 C) (Oral)  Resp 23  Ht 6' (1.829 m)  Wt 131 lb 2.8 oz (59.5 kg)  BMI 17.79 kg/m2  SpO2 100% Body mass index is 17.79 kg/(m^2).   General Appearance:  Alert, cooperative, responds to questions appropriately  Head:  Normocephalic, without obvious abnormality, atraumatic  ENT: Unremarkable  Neck: Supple, symmetrical, trachea midline  Lungs:   Bilat rhonchi, R>L  Chest Wall:  No tenderness or deformity  Heart:  Regular rate and rhythm, S1, S2 normal, no murmur, rub or gallop.  Abdomen:   Soft, non-tender, non distended.  Neurologic: Normal affect, no gross deficits.   Results for orders placed during the hospital encounter of 01/23/13 (from the past 48 hour(s))  CBC     Status: Abnormal   Collection Time    02/08/13  9:15 AM      Result Value Range   WBC 12.1 (*) 4.0 - 10.5 K/uL   RBC 2.00 (*) 4.22 - 5.81 MIL/uL   Hemoglobin 7.0 (*) 13.0 - 17.0 g/dL   HCT 14.7 (*) 82.9 - 56.2 %  MCV 105.0 (*) 78.0 - 100.0 fL   MCH 35.0 (*) 26.0 - 34.0 pg   MCHC 33.3  30.0 - 36.0 g/dL   RDW 45.4 (*) 09.8 - 11.9 %   Platelets 132 (*) 150 - 400 K/uL  GLUCOSE, CAPILLARY     Status: Abnormal   Collection Time    02/08/13 12:49 PM      Result Value Range   Glucose-Capillary 119 (*) 70 - 99 mg/dL   Comment 1 Notify RN    GLUCOSE, CAPILLARY     Status: Abnormal   Collection Time    02/08/13  5:05 PM      Result Value Range   Glucose-Capillary 129 (*) 70 - 99 mg/dL   Comment 1 Notify RN    GLUCOSE, CAPILLARY     Status: Abnormal   Collection Time    02/08/13  8:14 PM      Result Value Range   Glucose-Capillary 115 (*) 70 - 99 mg/dL  GLUCOSE, CAPILLARY     Status: Abnormal   Collection Time    02/09/13 12:43 AM      Result Value Range   Glucose-Capillary 114 (*) 70 - 99 mg/dL  GLUCOSE, CAPILLARY     Status: Abnormal   Collection Time    02/09/13  4:19 AM       Result Value Range   Glucose-Capillary 122 (*) 70 - 99 mg/dL  GLUCOSE, CAPILLARY     Status: Abnormal   Collection Time    02/09/13  8:09 AM      Result Value Range   Glucose-Capillary 123 (*) 70 - 99 mg/dL   Comment 1 Documented in Chart     Comment 2 Notify RN    CBC     Status: Abnormal   Collection Time    02/09/13  8:22 AM      Result Value Range   WBC 13.1 (*) 4.0 - 10.5 K/uL   RBC 1.98 (*) 4.22 - 5.81 MIL/uL   Hemoglobin 6.9 (*) 13.0 - 17.0 g/dL   Comment: REPEATED TO VERIFY     CRITICAL RESULT CALLED TO, READ BACK BY AND VERIFIED WITH:     HOWDELSCHELL,M RN 02/09/13 0906 WOOTEN,K   HCT 21.0 (*) 39.0 - 52.0 %   MCV 106.1 (*) 78.0 - 100.0 fL   MCH 34.8 (*) 26.0 - 34.0 pg   MCHC 32.9  30.0 - 36.0 g/dL   RDW 14.7 (*) 82.9 - 56.2 %   Platelets 159  150 - 400 K/uL  COMPREHENSIVE METABOLIC PANEL     Status: Abnormal   Collection Time    02/09/13  8:22 AM      Result Value Range   Sodium 146 (*) 135 - 145 mEq/L   Potassium 4.1  3.5 - 5.1 mEq/L   Chloride 104  96 - 112 mEq/L   CO2 31  19 - 32 mEq/L   Glucose, Bld 123 (*) 70 - 99 mg/dL   BUN 65 (*) 6 - 23 mg/dL   Creatinine, Ser 1.30  0.50 - 1.35 mg/dL   Calcium 8.6  8.4 - 86.5 mg/dL   Total Protein 8.0  6.0 - 8.3 g/dL   Albumin 1.8 (*) 3.5 - 5.2 g/dL   AST 29  0 - 37 U/L   ALT 22  0 - 53 U/L   Alkaline Phosphatase 78  39 - 117 U/L   Total Bilirubin 0.2 (*) 0.3 - 1.2 mg/dL   GFR calc non Af  Amer 63 (*) >90 mL/min   GFR calc Af Amer 74 (*) >90 mL/min   Comment: (NOTE)     The eGFR has been calculated using the CKD EPI equation.     This calculation has not been validated in all clinical situations.     eGFR's persistently <90 mL/min signify possible Chronic Kidney     Disease.  PREPARE RBC (CROSSMATCH)     Status: None   Collection Time    02/09/13 10:30 AM      Result Value Range   Order Confirmation ORDER PROCESSED BY BLOOD BANK    GLUCOSE, CAPILLARY     Status: Abnormal   Collection Time    02/09/13 11:32 AM       Result Value Range   Glucose-Capillary 108 (*) 70 - 99 mg/dL   Comment 1 Documented in Chart     Comment 2 Notify RN    TYPE AND SCREEN     Status: None   Collection Time    02/09/13 12:15 PM      Result Value Range   ABO/RH(D) O POS     Antibody Screen NEG     Sample Expiration 02/12/2013     Unit Number [id number]     Blood Component Type RED CELLS,LR     Unit division 00     Status of Unit ISSUED     Transfusion Status OK TO TRANSFUSE     Crossmatch Result Compatible    GLUCOSE, CAPILLARY     Status: Abnormal   Collection Time    02/09/13  3:55 PM      Result Value Range   Glucose-Capillary 120 (*) 70 - 99 mg/dL   Comment 1 Documented in Chart     Comment 2 Notify RN    HEMOGLOBIN AND HEMATOCRIT, BLOOD     Status: Abnormal   Collection Time    02/09/13  6:30 PM      Result Value Range   Hemoglobin 7.6 (*) 13.0 - 17.0 g/dL   HCT 11.9 (*) 14.7 - 82.9 %  GLUCOSE, CAPILLARY     Status: Abnormal   Collection Time    02/09/13  8:28 PM      Result Value Range   Glucose-Capillary 116 (*) 70 - 99 mg/dL  GLUCOSE, CAPILLARY     Status: Abnormal   Collection Time    02/10/13 12:23 AM      Result Value Range   Glucose-Capillary 123 (*) 70 - 99 mg/dL  GLUCOSE, CAPILLARY     Status: Abnormal   Collection Time    02/10/13  4:32 AM      Result Value Range   Glucose-Capillary 111 (*) 70 - 99 mg/dL  GLUCOSE, CAPILLARY     Status: Abnormal   Collection Time    02/10/13  8:00 AM      Result Value Range   Glucose-Capillary 119 (*) 70 - 99 mg/dL   Comment 1 Notify RN     No results found.  Assessment/Plan PCM and FTT from recent sepsis/resp failure. Discussed G-tube placement with pt. He seems to understand and is agreeable to procedure. Also tried to call wife, but no answer, RN reports she is visiting frequently. Labs reviewed. Procedure, risks, complications, use of sedation discussed. Will continue to try and reach wife for consent.  Brayton El  PA-C 02/10/2013, 8:48 AM

## 2013-02-10 NOTE — Progress Notes (Signed)
PULMONARY  / CRITICAL CARE MEDICINE  Name: Adrian [name] MRN: [id number] DOB: [birth year]  PCP [name], MD    ADMISSION DATE: 01/23/2013  CONSULTATION DATE: 02/04/2013  LOS 18 days   REFERRING MD : Serita Kyle, MD  PRIMARY SERVICE:  PCCM   CHIEF COMPLAINT: No complaints  BRIEF PATIENT DESCRIPTION: 62 y/o male with PMH ETOH, COPD, anemia, myelodysplastic syndrome (gets iron infusion at cancer center) admitted 9/17 with large RUL PNA. Decompensated on BiPAP. PCCM was consulted 9/17 for intubation and continued care in ICU.   LINES / TUBES:  R rad a-line 9/17 >> 9/20 ETT 9/17 >>9/28 Trach (jy 9/28>>> L IJ CVL9/17 >> out PICC > 10/1  CULTURES:  MRSA PCR 9/17>>> NEG Strep Ag 9/17>>> NEG  Legionella Ag 9/17 >> NEG Urine 9/17 >>  NEG Resp 9/17 >> NOF Blood 9/17 >> neg cdiff 9/27>>>neg  ANTIBIOTICS:  Ceftriaxone 9/17 >>> 9/18 Azithromycin 9/17 >> 9/20 Vanc 9/18 >> 9/21 Cefepime 9/18  >> 9/21 Unasyn 9/21 >>>   SIGNIFICANT EVENTS / STUDIES:  9/17 admitted by [facility] with dx of PNA. BiPAP initiated 9/17 Progressive resp failure. PCCM consulted. intubated   9/17 ECHO:  Mild LVH, EF 45-50%, grade 1 diastolic dysfunction, Septal dyssynergy. Akinesis base inferior wall. Severe hypokinesis inferolateral wall 9/18 CT w/o Contrast: Dense consolidation consistent  necrotizing PNA in RUL, interstitial PNA throughout R and L lower lobes, increase IS and AS dz in RLL 9/19 RASS 0. + F/C. Still requiring PEEP 8 and FiO2 50% 9/20 Change to PCV mode on vent to enhance comfort and synchrony 01/27/13:  Intubated. RASS 0. + F/C. Purulent ET secretions persist 01/28/13: On 70% fio2, peep 8, RR 18, PCV  and has significnat air trapping and vent dysonchrony despite sedation. RN reporting foul smelling secretions 01/29/13:  No change. 70% fio2/ 10 peep. Foul smelling et tube secretions +. Agitated and needed diprivan last night. Now doing ok on fent gtt 9/28 Trach placed. Fi02 50%, Peep 8.     SUBJECTIVE/OVERNIGHT/INTERVAL HX Had to be restrained last pm Tolerating some high PSV this am  VITAL SIGNS: Temp:  [98.5 F (36.9 C)-99.5 F (37.5 C)] 98.5 F (36.9 C) (10/05 0800) Pulse Rate:  [95-113] 105 (10/05 0912) Resp:  [12-34] 24 (10/05 0912) BP: (93-137)/(57-72) 107/63 mmHg (10/05 0912) SpO2:  [95 %-100 %] 100 % (10/05 0800) FiO2 (%):  [30 %-40 %] 40 % (10/05 0919)  Scheduled MEDS>  . antiseptic oral rinse  15 mL Mouth Rinse QID  . budesonide (PULMICORT) nebulizer solution  0.25 mg Nebulization Q6H  . [START ON 02/11/2013]  ceFAZolin (ANCEF) IV  2 g Intravenous Once  . chlorhexidine  15 mL Mouth Rinse BID  . darbepoetin (ARANESP) injection - NON-DIALYSIS  100 mcg Subcutaneous Q Wed-1800  . folic acid  1 mg Per Tube Daily  . free water  200 mL Per Tube Q6H  . furosemide  60 mg Intravenous Q6H  . insulin aspart  0-15 Units Subcutaneous Q4H  . ipratropium  0.5 mg Nebulization Q6H  . levalbuterol  0.63 mg Nebulization Q6H  . loperamide  4 mg Oral Q8H  . metoprolol tartrate  25 mg Per Tube BID  . pantoprazole sodium  40 mg Per Tube Q1200  . phenytoin (DILANTIN) IV  200 mg Intravenous Q12H  . potassium chloride  40 mEq Oral Q12H  . sodium chloride  3 mL Intravenous Q12H    PHYSICAL EXAMINATION: General:  Trach in place, Alert responds to commands appropriately  Neuro:  RASS 0, follows commands, some agitation HEENT:  NCAT, WNL Neck:  Supple, CVL site clean Cardiovascular: RRR, no m/r/g Lungs: diffuse rhonchi, diminished sounds in RUL Abdomen:  Soft, Nontender, + BS  Ext:  SCD in place bilaterally, +1 edema at ankles, warm without rash  No results found for this basename: PHART, PCO2, PCO2ART, PO2, PO2ART, HCO3, TCO2, O2SAT,  in the last 168 hours  CBC  Recent Labs Lab 02/07/13 0915 02/08/13 0915 02/09/13 0822 02/09/13 1830  HGB 7.7* 7.0* 6.9* 7.6*  HCT 24.1* 21.0* 21.0* 23.3*  WBC 15.8* 12.1* 13.1*  --   PLT 110* 132* 159  --     COAGULATION No  results found for this basename: INR,  in the last 168 hours  CARDIAC   No results found for this basename: TROPONINI,  in the last 168 hours No results found for this basename: PROBNP,  in the last 168 hours  CHEMISTRY  Recent Labs Lab 02/04/13 0500 02/07/13 0525 02/09/13 0822  NA 140 145 146*  K 4.2 3.9 4.1  CL 98 101 104  CO2 32 33* 31  GLUCOSE 139* 128* 123*  BUN 40* 49* 65*  CREATININE 0.75 0.87 1.18  CALCIUM 7.9* 8.9 8.6   Estimated Creatinine Clearance: 53.2 ml/min (by C-G formula based on Cr of 1.18).  LIVER  Recent Labs Lab 02/09/13 0822  AST 29  ALT 22  ALKPHOS 78  BILITOT 0.2*  PROT 8.0  ALBUMIN 1.8*   ENDOCRINE CBG (last 3)   Recent Labs  02/10/13 0023 02/10/13 0432 02/10/13 0800  GLUCAP 123* 111* 119*    IMAGING:  CXR 9/28 showing correct placement of NG tube and Tracheostomy tube in good position. RUL consolidation without change from previous films. New  Possible small pleural effusion on L.  CXR 10/1> continued dense consolidation RUL   PULMONARY  A: Acute respiratory failure due to RUL necrotizing PNA COPD Bilateral infiltrates, ARDS P:  - Maintain plat less 30, maintain current TV. - Diurese as tolerated - Continue high PS weaning on 10/1 - Upright position - Needs LTAC placement.  CARDIOVASCULAR  A: Shock, septic, resolved Mild cardiomyopathy with WMAs - suspect ischemic Hypertension P:   - ASA on hold, low hgb. - Cont scheduled metoprolol. - Cont PRN metoprolol to maintain HR < 115. - Cont PRN hydralazine to maintain SBP < 170 mmHg. - Lasix ordered, -1 liter yesterday, will continue current dosage.  RENAL  A: Acute renal insufficiency, resolved P: - Free water 300cc q4 via tube, will recheck BMET, if Na is dropping may have to decrease. - Follow bmp. - KVO IVF. - Continue lasix 60mg  q6h, will likely need to decrease in AM pending BMET results.  GASTROINTESTINAL  A: Protein calorie malnutrition  01/31/13 -  diarrhea + (C.diff negative) P:  - Continue TFs. - Cont SUP. - Imodium. - PEG requested, IR will perform when he completes rx for PNA / no longer febrile.  HEMATOLOGIC  A: Myelodysplastic syndrome  Anemia , chronic and critical illness- on aranesp as outpt. S/p PRBC 01/29/13 Thrombocytopenia 01/30/13 - 50% drop on day 6  . Suspected HI -low STarted DTI 01/30/13 HIT negative P:  - Monitor CBC => Hg 10/4= 6.9 will Tx 1u today => 7.6 - Prev received transfusion 1u 9/27 and 9/29 for critical care induced anemia superimposed on myelodysplastic syndrome.  INFECTIOUS  A: Septic shock - resolved 01/24/13 Necrotizing PNA, NOS P:  - Continue unasyn - Plan total abx duration 14  days; (day 13 on 02/08/13)  ENDOCRINE  A: Hyperglycemia without prior hx of DM Chronic steroid use-->stress dose steroids off.  P:  - Continue SSI.  NEUROLOGIC  A: Seizure disorder  EtOH abuse Hx Deconditioning P:  - Intermittent fentanyl for pain, decrease sedation as able. - Cont phenytoin - cont IV due to convenience and binding interaction on po route. - PT/OT.  Lonzo Cloud. Kriste Basque, M.D. [facility] Pulmonary/Critical Care Medicine. After hours pager: [phone]. 02/10/13 @ 9:55AM

## 2013-02-11 ENCOUNTER — Inpatient Hospital Stay (HOSPITAL_COMMUNITY): Payer: BC Managed Care – PPO

## 2013-02-11 LAB — COMPREHENSIVE METABOLIC PANEL
ALT: 20 U/L (ref 0–53)
AST: 21 U/L (ref 0–37)
Albumin: 1.8 g/dL — ABNORMAL LOW (ref 3.5–5.2)
Alkaline Phosphatase: 76 U/L (ref 39–117)
BUN: 78 mg/dL — ABNORMAL HIGH (ref 6–23)
Calcium: 8.6 mg/dL (ref 8.4–10.5)
Chloride: 106 mEq/L (ref 96–112)
GFR calc Af Amer: 70 mL/min — ABNORMAL LOW (ref >90)
Glucose, Bld: 91 mg/dL (ref 70–99)
Potassium: 4.3 mEq/L (ref 3.5–5.1)
Sodium: 149 mEq/L — ABNORMAL HIGH (ref 135–145)
Total Bilirubin: 0.3 mg/dL (ref 0.3–1.2)
Total Protein: 8.2 g/dL (ref 6.0–8.3)

## 2013-02-11 LAB — CBC WITH DIFFERENTIAL/PLATELET
Basophils Absolute: 0.1 10*3/uL (ref 0.0–0.1)
HCT: 24.6 % — ABNORMAL LOW (ref 39.0–52.0)
Lymphocytes Relative: 10 % — ABNORMAL LOW (ref 12–46)
Monocytes Relative: 9 % (ref 3–12)
Neutro Abs: 7.4 10*3/uL (ref 1.7–7.7)
Platelets: 244 10*3/uL (ref 150–400)
RDW: 22.5 % — ABNORMAL HIGH (ref 11.5–15.5)
WBC: 9.4 10*3/uL (ref 4.0–10.5)

## 2013-02-11 LAB — GLUCOSE, CAPILLARY
Glucose-Capillary: 99 mg/dL (ref 70–99)
Glucose-Capillary: 129 mg/dL — ABNORMAL HIGH (ref 70–99)
Glucose-Capillary: 89 mg/dL (ref 70–99)
Glucose-Capillary: 83 mg/dL (ref 70–99)

## 2013-02-11 MED ORDER — HYDROMORPHONE HCL PF 1 MG/ML IJ SOLN
1.0000 mg | INTRAMUSCULAR | Status: DC | PRN
  Administered 2013-02-11: 1 mg via INTRAVENOUS
  Filled 2013-02-11: qty 1

## 2013-02-11 MED ORDER — HYDROCODONE-ACETAMINOPHEN 5-325 MG PO TABS: 1.0000 | ORAL_TABLET | ORAL | Status: DC | PRN

## 2013-02-11 MED ORDER — FUROSEMIDE 10 MG/ML IJ SOLN
60.0000 mg | Freq: Two times a day (BID) | INTRAMUSCULAR | Status: DC
  Administered 2013-02-11 – 2013-02-12 (×3): 60 mg via INTRAVENOUS
  Filled 2013-02-11 (×2): qty 6

## 2013-02-11 MED ORDER — FENTANYL CITRATE 0.05 MG/ML IJ SOLN
INTRAMUSCULAR | Status: AC | PRN
  Administered 2013-02-11: 50 ug via INTRAVENOUS

## 2013-02-11 MED ORDER — FREE WATER
300.0000 mL | Status: DC
  Administered 2013-02-11 – 2013-02-12 (×6): 300 mL

## 2013-02-11 MED ORDER — IOHEXOL 300 MG/ML  SOLN
50.0000 mL | Freq: Once | INTRAMUSCULAR | Status: AC | PRN
  Administered 2013-02-11: 20 mL

## 2013-02-11 MED ORDER — MIDAZOLAM HCL 2 MG/2ML IJ SOLN
INTRAMUSCULAR | Status: AC | PRN
  Administered 2013-02-11: 1 mg via INTRAVENOUS

## 2013-02-11 MED ORDER — MIDAZOLAM HCL 2 MG/2ML IJ SOLN
INTRAMUSCULAR | Status: AC
  Filled 2013-02-11: qty 2

## 2013-02-11 MED ORDER — ONDANSETRON HCL 4 MG/2ML IJ SOLN: 4.0000 mg | INTRAMUSCULAR | Status: DC | PRN

## 2013-02-11 MED ORDER — FENTANYL CITRATE 0.05 MG/ML IJ SOLN
INTRAMUSCULAR | Status: AC
  Filled 2013-02-11: qty 2

## 2013-02-11 MED ORDER — CEFAZOLIN SODIUM-DEXTROSE 2-3 GM-% IV SOLR
INTRAVENOUS | Status: AC
  Administered 2013-02-11: 2 g
  Filled 2013-02-11: qty 50

## 2013-02-11 NOTE — Progress Notes (Addendum)
Occupational Therapy Treatment Patient Details Name: Adrian [name] MRN: [id number] DOB: 1950-07-07 Today's Date: 02/11/2013 Time: 9528-4132 OT Time Calculation (min): 33 min  OT Assessment / Plan / Recommendation  History of present illness  62 y/o male with PMH ETOH, COPD, anemia, myelodysplastic syndrome (gets iron infusion at cancer center) admitted 9/17 with large RUL PNA. Decompensated on BiPAP. PCCM was consulted 9/17 for intubation. Trach. For Peg tube today (02/11/13)   OT comments  Pt making great progress, will continue to benefit from acute OT with follow up at SNF.  Follow Up Recommendations  SNF       Equipment Recommendations  3 in 1 bedside comode       Frequency Min 2X/week   Progress towards OT Goals Progress towards OT goals: Progressing toward goals  Plan Discharge plan remains appropriate    Precautions / Restrictions Precautions Precautions: Fall Precaution Comments: trach/vent, NGT--will be a PEG later today (02/11/13) Restrictions Weight Bearing Restrictions: No   Pertinent Vitals/Pain Vital signs stable throughout session; pt on trach collar   ADL  Grooming: Wash/dry face;Set up Where Assessed - Grooming: Supine, head of bed up Toilet Transfer: Minimal assistance Toilet Transfer Method: Sit to stand (from bed) Toilet Transfer Equipment:  (to use urinal) Toileting - Clothing Manipulation and Hygiene: +1 Total assistance Where Assessed - Toileting Clothing Manipulation and Hygiene: Standing Transfers/Ambulation Related to ADLs: Min A sit<>stand x2 from EOB      OT Goals(current goals can now be found in the care plan section)    Visit Information  Last OT Received On: 02/11/13 Assistance Needed: +1 History of Present Illness:  62 y/o male with PMH ETOH, COPD, anemia, myelodysplastic syndrome (gets iron infusion at cancer center) admitted 9/17 with large RUL PNA. Decompensated on BiPAP. PCCM was consulted 9/17 for intubation. Trach. For Peg tube  today (02/11/13)          Cognition  Cognition Arousal/Alertness: Awake/alert Behavior During Therapy: WFL for tasks assessed/performed Overall Cognitive Status: Within Functional Limits for tasks assessed Problem Solving: Slow processing Difficult to assess due to: Tracheostomy    Mobility  Bed Mobility Bed Mobility: Supine to Sit;Sitting - Scoot to [place] of Bed;Sit to Supine;Scooting to [facility] Supine to Sit: 4: Min assist (trunk) Sitting - Scoot to Edge of Bed: 4: Min guard Sit to Supine: 4: Min assist (legs) Scooting to HOB: 3: Mod assist (bed in trendelenberg and pt pushing with LEs) Details for Bed Mobility Assistance: cueing for sequence and safety with assist to elevate trunk Transfers Transfers: Sit to Stand;Stand to Sit Sit to Stand: 4: Min assist;With upper extremity assist;From bed Stand to Sit: 4: Min assist;With upper extremity assist;To bed Details for Transfer Assistance: cueing for hand placement, safety and sequence       Balance Static Sitting Balance Static Sitting - Balance Support: No upper extremity supported;Feet supported Static Sitting - Level of Assistance: 6: Modified independent (Device/Increase time) Static Sitting - Comment/# of Minutes: 5 working on anterior and posterior pelvic tilts   End of Session OT - End of Session Activity Tolerance: Patient tolerated treatment well Patient left: in bed;with call bell/phone within reach;with family/visitor present Nurse Communication:  (needs suctioning, urinated, and PAS hose not working)  GO     Evette Georges [phone] 02/11/2013, 12:53 PM

## 2013-02-11 NOTE — Progress Notes (Signed)
PULMONARY  / CRITICAL CARE MEDICINE  Name: Adrian [name] MRN: [id number] DOB: 1950-06-17  PCP [name], MD    ADMISSION DATE: 01/23/2013  CONSULTATION DATE: 02/04/2013  LOS 19 days   REFERRING MD : Serita Kyle, MD  PRIMARY SERVICE:  PCCM   CHIEF COMPLAINT: No complaints  BRIEF PATIENT DESCRIPTION: 62 y/o male with PMH ETOH, COPD, anemia, myelodysplastic syndrome (gets iron infusion at cancer center) admitted 9/17 with large RUL PNA. Decompensated on BiPAP. PCCM was consulted 9/17 for intubation and continued care in ICU.   LINES / TUBES:  R rad a-line 9/17 >> 9/20 ETT 9/17 >>9/28 Trach (jy 9/28>>> L IJ CVL9/17 >> out PICC > 10/1  CULTURES:  MRSA PCR 9/17>>> NEG Strep Ag 9/17>>> NEG  Legionella Ag 9/17 >> NEG Urine 9/17 >>  NEG Resp 9/17 >> NOF Blood 9/17 >> neg cdiff 9/27>>>neg  ANTIBIOTICS:  Ceftriaxone 9/17 >>> 9/18 Azithromycin 9/17 >> 9/20 Vanc 9/18 >> 9/21 Cefepime 9/18  >> 9/21 Unasyn 9/21 >>>   SIGNIFICANT EVENTS / STUDIES:  9/17 admitted by [facility] with dx of PNA. BiPAP initiated 9/17 Progressive resp failure. PCCM consulted. intubated   9/17 ECHO:  Mild LVH, EF 45-50%, grade 1 diastolic dysfunction, Septal dyssynergy. Akinesis base inferior wall. Severe hypokinesis inferolateral wall 9/18 CT w/o Contrast: Dense consolidation consistent  necrotizing PNA in RUL, interstitial PNA throughout R and L lower lobes, increase IS and AS dz in RLL 9/19 RASS 0. + F/C. Still requiring PEEP 8 and FiO2 50% 9/20 Change to PCV mode on vent to enhance comfort and synchrony 01/27/13:  Intubated. RASS 0. + F/C. Purulent ET secretions persist 01/28/13: On 70% fio2, peep 8, RR 18, PCV  and has significnat air trapping and vent dysonchrony despite sedation. RN reporting foul smelling secretions 01/29/13:  No change. 70% fio2/ 10 peep. Foul smelling et tube secretions +. Agitated and needed diprivan last night. Now doing ok on fent gtt 9/28 Trach placed. Fi02 50%, Peep 8.     SUBJECTIVE/OVERNIGHT/INTERVAL HX Up in chair, interactive, no distress on ATC, actually looks fantastic   VITAL SIGNS: Temp:  [97 F (36.1 C)-98.9 F (37.2 C)] 98.6 F (37 C) (10/06 0400) Pulse Rate:  [92-105] 95 (10/06 0615) Resp:  [16-33] 16 (10/06 0615) BP: (103-123)/(62-79) 113/72 mmHg (10/06 0615) SpO2:  [99 %-100 %] 100 % (10/06 0615) FiO2 (%):  [40 %] 40 % (10/06 0615)   Intake/Output Summary (Last 24 hours) at 02/11/13 1003 Last data filed at 02/11/13 0600  Gross per 24 hour  Intake   3005 ml  Output   2450 ml  Net    555 ml    PHYSICAL EXAMINATION: General:  Trach in place, Alert responds to commands appropriately, very interactive and appreciative  Neuro:  RASS 0, follows commands, appropriate  HEENT:  NCAT, WNL, trach #6 midline  Cardiovascular: RRR, no m/r/g Lungs: diffuse rhonchi, diminished sounds in RUL Abdomen:  Soft, Nontender, + BS  Ext:  SCD in place bilaterally, +1 edema at ankles, warm without rash  No results found for this basename: PHART, PCO2, PCO2ART, PO2, PO2ART, HCO3, TCO2, O2SAT,  in the last 168 hours  CBC  Recent Labs Lab 02/08/13 0915 02/09/13 0822 02/09/13 1830 02/11/13 0415  HGB 7.0* 6.9* 7.6* 7.8*  HCT 21.0* 21.0* 23.3* 24.6*  WBC 12.1* 13.1*  --  9.4  PLT 132* 159  --  244    COAGULATION No results found for this basename: INR,  in the last 168  hours  CARDIAC   No results found for this basename: TROPONINI,  in the last 168 hours No results found for this basename: PROBNP,  in the last 168 hours  CHEMISTRY  Recent Labs Lab 02/07/13 0525 02/09/13 0822 02/11/13 0415  NA 145 146* 149*  K 3.9 4.1 4.3  CL 101 104 106  CO2 33* 31 29  GLUCOSE 128* 123* 91  BUN 49* 65* 78*  CREATININE 0.87 1.18 1.23  CALCIUM 8.9 8.6 8.6   Estimated Creatinine Clearance: 51.1 ml/min (by C-G formula based on Cr of 1.23).  LIVER  Recent Labs Lab 02/09/13 0822 02/11/13 0415  AST 29 21  ALT 22 20  ALKPHOS 78 76  BILITOT  0.2* 0.3  PROT 8.0 8.2  ALBUMIN 1.8* 1.8*   ENDOCRINE CBG (last 3)   Recent Labs  02/10/13 1206 02/10/13 1634 02/10/13 2009  GLUCAP 127* 92 123*    IMAGING:  10/6 PCXR: persistent RUL dense consolidation, but aeration improved otherwise    PULMONARY  A: Acute respiratory failure due to RUL necrotizing PNA and ARDS w/ failure to wean and trach placement 9/28 COPD Aeration from ARDS standpoint improved, still has dense consolidation on RUL.  P:  Cont ATC trials as tolerated/ cycle w/ PSV and Nocturnal rest on vent as needed Cont BDs and ICS Keep even to neg fluid status as BUN/creatinine will allow  Will benefit from LTAC  CARDIOVASCULAR  A: Shock, septic, resolved Mild cardiomyopathy with WMAs - suspect ischemic Hypertension P:   - ASA on hold, low hgb. - Cont scheduled metoprolol. - Cont PRN metoprolol to maintain HR < 115. - Cont PRN hydralazine to maintain SBP < 170 mmHg.   RENAL  A: Acute renal insufficiency, resolved Hypernatremia  P: - Free water 300cc q4 via tube - KVO IVF. - decrease lasix dosing   GASTROINTESTINAL  A: Protein calorie malnutrition  01/31/13 - diarrhea + (C.diff negative) P:  - Continue TFs. - Cont SUP. - Imodium. - PEG requested, IR will perform when he completes rx for PNA / no longer febrile.  HEMATOLOGIC  A: Myelodysplastic syndrome  Anemia , chronic and critical illness- on aranesp as outpt. S/p PRBC 01/29/13 Thrombocytopenia 01/30/13 - 50% drop on day 6  . Suspected HI -low STarted DTI 01/30/13 HIT negative  - Prev received transfusion 1u 9/27 and 9/29 for critical care induced anemia superimposed on myelodysplastic syndrome. P: Trend cbc Transfuse for hgb < 7  INFECTIOUS  A: Septic shock - resolved 01/24/13 Necrotizing PNA, NOS-->completed ABX,  P:  -monitor off abx    ENDOCRINE  A: Hyperglycemia without prior hx of DM Chronic steroid use-->stress dose steroids off.  P:  - Continue SSI.  NEUROLOGIC   A: Seizure disorder  EtOH abuse Hx Deconditioning P:  - Intermittent fentanyl for pain, decrease sedation as able. - Cont phenytoin - cont IV due to convenience and binding interaction on po route. - PT/OT.  DISPO: [facility] in discussion w/ [employer]. THink he would make an excellent candidate.   Patient seen and examined, agree with above note.  I dictated the care and orders written for this patient under my direction.  Alyson Reedy, MD [phone]

## 2013-02-11 NOTE — Progress Notes (Signed)
Trach secured. No breakdown noted at trach site.

## 2013-02-11 NOTE — Clinical Social Work Note (Signed)
Per RNCM, PT is recommending LTACH and OT is recommending SNF; at this time, CSW to defer to [facility] order. CSW continues to follow pt and will initiate SNF search if consulted.    Maryclare Labrador, MSW, [facility] Clinical Social Worker [phone]

## 2013-02-11 NOTE — Progress Notes (Signed)
Trach secured. No breakdown noted at trach site. Placed on 35% ATC with no complications. 

## 2013-02-11 NOTE — Progress Notes (Signed)
Medication CONSULT NOTE - FOLLOW UP  Pharmacy Consult for Aranesp Indication: Anemia  No Known Allergies  Patient Measurements: Height: 6' (182.9 cm) Weight: 131 lb 2.8 oz (59.5 kg) IBW/kg (Calculated) : 77.6  Vital Signs: Temp: 97.4 F (36.3 C) (10/06 0812) Temp src: Oral (10/06 0812) BP: 119/69 mmHg (10/06 1030) Pulse Rate: 95 (10/06 1030) Intake/Output from previous day: 10/05 0701 - 10/06 0700 In: 3005 [I.V.:243; NG/GT:2400; IV Piggyback:362] Out: 2950 [Urine:2950]  Labs:  Recent Labs  02/09/13 0822 02/09/13 1830 02/11/13 0415  WBC 13.1*  --  9.4  HGB 6.9* 7.6* 7.8*  PLT 159  --  244  CREATININE 1.18  --  1.23   Estimated Creatinine Clearance: 51.1 ml/min (by C-G formula based on Cr of 1.23).  Assessment: 62 YOM on 9/17 with progressive SOB, productive cough, and fever.   * Hematology / Oncology: myelodysplastic syndrome (gets iron infusion at cancer center) now on Aranesp per RX - hgb 7.8 today s/p 1 units PRBC 9/29, plts 244. On 300 mcg about every 3 weeks PTA - on home iron (retic pct normal, retic count low, iron level low at 25, ferritin 1099). Summary as follows:   Ref. Range 02/09/2013 08:22 02/09/2013 18:30 02/11/2013 04:15  Hemoglobin Latest Range: 13.0-17.0 g/dL 6.9 (LL) 7.6 (L) 7.8 (L)   Plan:  -  Continue Aranesp 100 mcg SQ q-Wed x 4 doses  Nadara Mustard, PharmD., MS Clinical Pharmacist Pager:  [phone] Thank you for allowing pharmacy to be part of this patients care team. 02/11/2013,10:32 AM

## 2013-02-11 NOTE — Progress Notes (Signed)
Lowered ATC to 28% due to 100% O2 sats

## 2013-02-11 NOTE — Procedures (Signed)
38F Gastrostomy placed under fluoroscopy No complication No blood loss. See complete dictation in [facility].

## 2013-02-11 NOTE — Progress Notes (Signed)
Physical Therapy Treatment Patient Details Name: Adrian [name] MRN: [id number] DOB: 11-19-1950 Today's Date: 02/11/2013 Time: 0801-0825 PT Time Calculation (min): 24 min  PT Assessment / Plan / Recommendation  History of Present Illness  63 y/o male with PMH ETOH, COPD, anemia, myelodysplastic syndrome (gets iron infusion at cancer center) admitted 9/17 with large RUL PNA. Decompensated on BiPAP. PCCM was consulted 9/17 for intubation. Trach   PT Comments   Pt with excellent progress today able to ambulate in room on vent and will attempt further distance with RT next session. Pt with assist for lower body bathing/dressing  Due to condom cath falling off. Pt encouraged to perform HEP and increased mobility with nursing. Will continue to follow.  Follow Up Recommendations  LTACH     Does the patient have the potential to tolerate intense rehabilitation     Barriers to Discharge        Equipment Recommendations  Rolling walker with 5" wheels    Recommendations for Other Services    Frequency     Progress towards PT Goals Progress towards PT goals: Goals met and updated - see care plan  Plan Current plan remains appropriate    Precautions / Restrictions Precautions Precautions: Fall Precaution Comments: trach/vent, NGT Restrictions Weight Bearing Restrictions: No   Pertinent Vitals/Pain 100% on vent 40%, peep5 HR 103 No pain    Mobility  Bed Mobility Bed Mobility: Rolling Left;Left Sidelying to Sit Rolling Left: 4: Min assist;With rail Left Sidelying to Sit: 4: Min assist;HOB flat;With rails Sitting - Scoot to Edge of Bed: 5: Supervision Details for Bed Mobility Assistance: cueing for sequence and safety with assist to elevate trunk Transfers Sit to Stand: From bed;4: Min assist;From chair/3-in-1 Stand to Sit: 4: Min assist;To chair/3-in-1;With armrests Details for Transfer Assistance: cueing for hand placement, safety and sequence Ambulation/Gait Ambulation/Gait  Assistance: 4: Min assist (+1 to manage vent limited distance) Ambulation Distance (Feet): 15 Feet Assistive device: Rolling walker Ambulation/Gait Assistance Details: cueing for posture and position in RW Gait Pattern: Step-through pattern;Decreased stride length;Trunk flexed Gait velocity: decreased Stairs: No    Exercises     PT Diagnosis:    PT Problem List:   PT Treatment Interventions:     PT Goals (current goals can now be found in the care plan section)    Visit Information  Last PT Received On: 02/11/13 Assistance Needed: +1 History of Present Illness:  62 y/o male with PMH ETOH, COPD, anemia, myelodysplastic syndrome (gets iron infusion at cancer center) admitted 9/17 with large RUL PNA. Decompensated on BiPAP. PCCM was consulted 9/17 for intubation. Trach    Subjective Data      Cognition  Cognition Arousal/Alertness: Awake/alert Behavior During Therapy: WFL for tasks assessed/performed Overall Cognitive Status: Within Functional Limits for tasks assessed Problem Solving: Slow processing Difficult to assess due to: Tracheostomy    Balance  Static Sitting Balance Static Sitting - Balance Support: No upper extremity supported;Feet supported Static Sitting - Level of Assistance: 6: Modified independent (Device/Increase time) Static Sitting - Comment/# of Minutes: 4  End of Session PT - End of Session Equipment Utilized During Treatment: Gait belt Activity Tolerance: Patient tolerated treatment well Patient left: in chair;with call bell/phone within reach;with nursing/sitter in room Nurse Communication: Mobility status   GP     Delorse Lek 02/11/2013, 12:40 PM Delaney Meigs, PT [phone]

## 2013-02-12 LAB — GLUCOSE, CAPILLARY
Glucose-Capillary: 110 mg/dL — ABNORMAL HIGH (ref 70–99)
Glucose-Capillary: 112 mg/dL — ABNORMAL HIGH (ref 70–99)

## 2013-02-12 LAB — BASIC METABOLIC PANEL
CO2: 25 mEq/L (ref 19–32)
Calcium: 8.7 mg/dL (ref 8.4–10.5)
Chloride: 108 mEq/L (ref 96–112)
Creatinine, Ser: 1.45 mg/dL — ABNORMAL HIGH (ref 0.50–1.35)
Glucose, Bld: 106 mg/dL — ABNORMAL HIGH (ref 70–99)
Potassium: 4.9 mEq/L (ref 3.5–5.1)
Sodium: 148 mEq/L — ABNORMAL HIGH (ref 135–145)

## 2013-02-12 MED ORDER — FREE WATER: 300.0000 mL | Status: DC

## 2013-02-12 MED ORDER — INFLUENZA VAC SPLIT QUAD 0.5 ML IM SUSP
0.5000 mL | Freq: Once | INTRAMUSCULAR | Status: AC
  Administered 2013-02-12: 0.5 mL via INTRAMUSCULAR
  Filled 2013-02-12: qty 0.5

## 2013-02-12 MED ORDER — SODIUM CHLORIDE 0.9 % IJ SOLN: 3.0000 mL | Freq: Two times a day (BID) | INTRAMUSCULAR | Status: DC

## 2013-02-12 MED ORDER — FENTANYL CITRATE 0.05 MG/ML IJ SOLN: 50.0000 ug | INTRAMUSCULAR | Status: DC | PRN

## 2013-02-12 MED ORDER — VITAL AF 1.2 CAL PO LIQD: 1000.0000 mL | ORAL | Status: DC

## 2013-02-12 MED ORDER — LOPERAMIDE HCL 1 MG/5ML PO LIQD: 4.0000 mg | Freq: Three times a day (TID) | ORAL | Status: DC

## 2013-02-12 MED ORDER — LEVALBUTEROL HCL 0.63 MG/3ML IN NEBU: 0.6300 mg | INHALATION_SOLUTION | RESPIRATORY_TRACT | Status: DC | PRN

## 2013-02-12 MED ORDER — INFLUENZA VAC SPLIT QUAD 0.5 ML IM SUSP: 0.5000 mL | INTRAMUSCULAR | Status: DC

## 2013-02-12 MED ORDER — BIOTENE DRY MOUTH MT LIQD: 15.0000 mL | Freq: Four times a day (QID) | OROMUCOSAL | Status: DC

## 2013-02-12 MED ORDER — ONDANSETRON HCL 4 MG/2ML IJ SOLN: 4.0000 mg | INTRAMUSCULAR | Status: DC | PRN

## 2013-02-12 MED ORDER — METOPROLOL TARTRATE 25 MG/10 ML ORAL SUSPENSION: 25.0000 mg | Freq: Two times a day (BID) | ORAL | Status: DC

## 2013-02-12 MED ORDER — FOLIC ACID 1 MG PO TABS: 1.0000 mg | ORAL_TABLET | Freq: Every day | ORAL | Status: DC

## 2013-02-12 MED ORDER — HYDROMORPHONE HCL PF 1 MG/ML IJ SOLN: 1.0000 mg | INTRAMUSCULAR | Status: DC | PRN

## 2013-02-12 MED ORDER — DARBEPOETIN ALFA-POLYSORBATE 100 MCG/0.5ML IJ SOLN: 100.0000 ug | INTRAMUSCULAR | Status: DC

## 2013-02-12 MED ORDER — BUDESONIDE 0.25 MG/2ML IN SUSP: 0.2500 mg | Freq: Four times a day (QID) | RESPIRATORY_TRACT | Status: DC

## 2013-02-12 MED ORDER — HYDRALAZINE HCL 20 MG/ML IJ SOLN: 10.0000 mg | INTRAMUSCULAR | Status: DC | PRN

## 2013-02-12 MED ORDER — METOPROLOL TARTRATE 1 MG/ML IV SOLN: 2.5000 mg | INTRAVENOUS | Status: DC | PRN

## 2013-02-12 MED ORDER — POTASSIUM CHLORIDE 20 MEQ/15ML (10%) PO LIQD: 40.0000 meq | Freq: Two times a day (BID) | ORAL | Status: DC

## 2013-02-12 MED ORDER — DEXTROSE 5 % IV SOLN: INTRAVENOUS | Status: DC

## 2013-02-12 MED ORDER — HYDROCODONE-ACETAMINOPHEN 5-325 MG PO TABS: 1.0000 | ORAL_TABLET | ORAL | Status: DC | PRN

## 2013-02-12 MED ORDER — SODIUM CHLORIDE 0.9 % IV SOLN: 200.0000 mg | Freq: Two times a day (BID) | INTRAVENOUS | Status: DC

## 2013-02-12 MED ORDER — LEVALBUTEROL HCL 0.63 MG/3ML IN NEBU: 0.6300 mg | INHALATION_SOLUTION | Freq: Four times a day (QID) | RESPIRATORY_TRACT | Status: DC

## 2013-02-12 MED ORDER — CHLORHEXIDINE GLUCONATE 0.12 % MT SOLN: 15.0000 mL | Freq: Two times a day (BID) | OROMUCOSAL | Status: DC

## 2013-02-12 MED ORDER — FUROSEMIDE 10 MG/ML IJ SOLN: 60.0000 mg | Freq: Two times a day (BID) | INTRAMUSCULAR | Status: DC

## 2013-02-12 MED ORDER — INSULIN ASPART 100 UNIT/ML ~~LOC~~ SOLN: 0.0000 [IU] | SUBCUTANEOUS | Status: DC

## 2013-02-12 MED ORDER — PANTOPRAZOLE SODIUM 40 MG PO PACK: 40.0000 mg | PACK | Freq: Every day | ORAL | Status: DC

## 2013-02-12 MED ORDER — IPRATROPIUM BROMIDE 0.02 % IN SOLN: 0.5000 mg | Freq: Four times a day (QID) | RESPIRATORY_TRACT | Status: DC

## 2013-02-12 NOTE — Discharge Summary (Signed)
Physician Discharge Summary     Patient ID: Adrian [name] MRN: [id number] DOB/AGE: 1950-12-27 62 y.o.  Admit date: 01/23/2013 Discharge date: 02/12/2013  Discharge Diagnoses:   Trach dependent status RUL necrotizing pneumonia  Resolving Acute lung injury  Mild cardiomyopathy  HTN Acute renal insufficiency (resolved) Hypernatremia (resolved) Protein calorie malnutrition  Myelodysplastic syndrome  Anemia , chronic and critical illness Thrombocytopenia Hyperglycemia without prior hx of DM  Chronic steroid use Seizure disorder  EtOH abuse Hx Deconditioning  Detailed Hospital Course:   62 y/o male with PMH COPD, seizures on dilantin, alcohol abuse, Myelodysplastic Syndrome, and anemia presents to [facility] on 01/23/13 with a c/o 1 weeks hx progressive nonproductive cough and SOB. In ER with RR 55 and O2 sat 86 on RA.  A RUL pneumonia was seen on 9/17 chest xray. Labs revealed elevated Troponin and D-Dimer. Pt had minimal response to Albuterol and Ipratropium tx x 2. Admitted to hospitalist service  and rx with abx, bipap. Cont to decompensate, requiring intubation.   He was admitted to the intensive care. Therapeutic interventions included: mechanical ventilation, empiric antibiotics, sedation protocol, and low tidal volume ventilation. His CXR continued to show dense RUL consolidation with early necrotizing changes. He then developed progressive ARDS, and in setting of underlying COPD this made love Vt ventilation efforts difficult and required brief escalation in sedation as well as addition of neuro-muscular blockade. He required prolonged High PEEP support and High FIO2. His course was complicated by: shock which we felt was sepsis. CM, and acute renal insufficiency. As he improved clinically we began weaning efforts. We also began aggressive diuresis. He was unable to wean and therefore we placed trach on 9/28. Following this we were able to push weaning efforts. He completed his antibiotic  course. He had PEG placed on 10/6. We were able to get him off the ventilator he is now weaning FIO2 and continuing ATC trials. At time of discharge he is now on ATC. He is cleared for d/c to [facility] where he can continue his rehab efforts. Specifically he will need SLP evaluation for swallowing and PMV. Also needs PT and OT eval. He may eventually be a candidate for decannulation.   Discharge planning by active issues.   Acute respiratory failure due to RUL necrotizing PNA and ARDS w/ failure to wean and trach placement 9/28  COPD  Aeration from ARDS standpoint improved, still has dense consolidation on RUL.  P:  Cont ATC trials as tolerated Cont BDs and ICS  Keep even to neg fluid status as BUN/creatinine will allow  Will need eval down the road for decannulation  Mild cardiomyopathy with WMAs - suspect ischemic  Hypertension  P:  - ASA on hold, low hgb.  - Cont scheduled metoprolol.  - Cont PRN metoprolol to maintain HR < 115.  - Cont PRN hydralazine to maintain SBP < 170 mmHg.   Acute renal insufficiency, resolved  Hypernatremia  P:  - Free water 300cc q4 via tube  - KVO IVF.  - decrease lasix dosing   Protein calorie malnutrition  01/31/13 - diarrhea + (C.diff negative)  P:  - Continue TFs.  - Cont SUP.  - Imodium.  -advance diet as tolerated via feeding tube -could eventually has SLP eval for PO intake.  -if tolerates oral intake PEG could potentially be removed in 5 weeks.   Myelodysplastic syndrome  Anemia , chronic and critical illness- on aranesp as outpt. S/p PRBC 01/29/13  P: Trend cbc  Transfuse for  hgb < 7   Septic shock - resolved 01/24/13  Necrotizing PNA, NOS-->completed ABX,  P:  -monitor off abx  -repeat CXR intermittently   Hyperglycemia without prior hx of DM  Chronic steroid use-->stress dose steroids off.  P:  - Continue SSI.   Seizure disorder  EtOH abuse Hx-->no evidence of w/d during hospital stay Deconditioning  P:  Continue  PRN Analgesia  Cont phenytoin - cont IV due to convenience and binding interaction on po route.  Will need PT and OT referral on arrival to [facility] tests/ studies/ interventions and procedures  LINES / TUBES:  R rad a-line 9/17 >> 9/20  ETT 9/17 >>9/28  Trach (jy) 9/28>>>  L IJ CVL9/17 >> out  PICC > 10/1   CULTURES:  MRSA PCR 9/17>>> NEG  Strep Ag 9/17>>> NEG  Legionella Ag 9/17 >> NEG  Urine 9/17 >> NEG  Resp 9/17 >> NOF  Blood 9/17 >> neg  cdiff 9/27>>>neg   ANTIBIOTICS:  Ceftriaxone 9/17 >>> 9/18  Azithromycin 9/17 >> 9/20  Vanc 9/18 >> 9/21  Cefepime 9/18 >> 9/21  Unasyn 9/21 >>> completed   SIGNIFICANT EVENTS / STUDIES:  9/17 admitted by [facility] with dx of PNA. BiPAP initiated  9/17 Progressive resp failure. PCCM consulted. intubated  9/17 ECHO: Mild LVH, EF 45-50%, grade 1 diastolic dysfunction, Septal dyssynergy. Akinesis base inferior wall. Severe hypokinesis inferolateral wall  9/18 CT w/o Contrast: Dense consolidation consistent necrotizing PNA in RUL, interstitial PNA throughout R and L lower lobes, increase IS and AS dz in RLL  9/19 RASS 0. + F/C. Still requiring PEEP 8 and FiO2 50%  9/20 Change to PCV mode on vent to enhance comfort and synchrony  01/27/13: Intubated. RASS 0. + F/C. Purulent ET secretions persist  01/28/13: On 70% fio2, peep 8, RR 18, PCV and has significnat air trapping and vent dysonchrony despite sedation. RN reporting foul smelling secretions  01/29/13: No change. 70% fio2/ 10 peep. Foul smelling et tube secretions +. Agitated and needed diprivan last night. Now doing ok on fent gtt  9/28 Trach placed. Fi02 50%, Peep 8.    Discharge Exam: BP 128/74  Pulse 94  Temp(Src) 97.3 F (36.3 C) (Oral)  Resp 17  Ht 6' (1.829 m)  Wt 53.3 kg (117 lb 8.1 oz)  BMI 15.93 kg/m2  SpO2 98%  PHYSICAL EXAMINATION:  General: Trach in place, Alert responds to commands appropriately, very interactive and appreciative  Neuro: RASS  0, follows commands, appropriate  HEENT: NCAT, WNL, trach #6 midline  Cardiovascular: RRR, no m/r/g  Lungs: diffuse rhonchi, diminished sounds in RUL  Abdomen: Soft, Nontender, + BS  Ext: SCD in place bilaterally, +1 edema at ankles, warm without rash   Labs at discharge Lab Results  Component Value Date   CREATININE 1.45* 02/12/2013   BUN 85* 02/12/2013   NA 148* 02/12/2013   K 4.9 02/12/2013   CL 108 02/12/2013   CO2 25 02/12/2013   Lab Results  Component Value Date   WBC 9.4 02/11/2013   HGB 7.8* 02/11/2013   HCT 24.6* 02/11/2013   MCV 104.7* 02/11/2013   PLT 244 02/11/2013   Lab Results  Component Value Date   ALT 20 02/11/2013   AST 21 02/11/2013   ALKPHOS 76 02/11/2013   BILITOT 0.3 02/11/2013   Lab Results  Component Value Date   INR 1.37 02/03/2013   INR 1.67* 01/23/2013    Current radiology studies Ir  Gastrostomy Tube Mod Sed  02/11/2013   CLINICAL DATA:  Severe malnutrition, needs enteral feeding support.  EXAM: PERC PLACEMENT GASTROSTOMY  TECHNIQUE: The procedure, risks, benefits, and alternatives were explained to the patient. Questions regarding the procedure were encouraged and answered. The patient understands and consents to the procedure. As antibiotic prophylaxis, cefazolin 2 g IV was ordered pre-procedure and administered intravenously within one hour of incision.Progression of previously administered oral barium into the colon was confirmed fluoroscopically. A 5 French angiographic catheter was placed as orogastric tube. The upper abdomen was prepped with Betadine, draped in usual sterile fashion, and infiltrated locally with 1% lidocaine.  Intravenous Fentanyl and Versed were administered as conscious sedation during continuous cardiorespiratory monitoring by the radiology RN, with a total moderate sedation time of less than 30 minutes.  Stomach was insufflated using air through the orogastric tube. An 42 French sheath needle was advanced percutaneously into the gastric  lumen under fluoroscopy. Gas could be aspirated and a small contrast injection confirmed intraluminal spread. The sheath was exchanged over a guidewire for a 9 [place] vascular sheath, through which the snare device was advanced and used to snare a guidewire passed through the orogastric tube. This was withdrawn, and the snare attached to the 20 French pull-through gastrostomy tube, which was advanced antegrade, positioned with the internal bumper securing the anterior gastric wall to the anterior abdominal wall. Small contrast injection confirms appropriate positioning. The external bumper was applied and the catheter was flushed. No immediate complication.  FLUOROSCOPY TIME:  36 seconds  IMPRESSION: 1. Technically successful 20 French pull-through gastrostomy placement under fluoroscopy.   Electronically Signed   By: Oley Balm M.D.   On: 02/11/2013 15:15   Dg Chest Port 1 View  02/11/2013   CLINICAL DATA:  Right upper lobe pneumonia. Shortness of breath.  EXAM: PORTABLE CHEST - 1 VIEW  COMPARISON:  One-view chest 02/06/2013  FINDINGS: The heart size is normal. Mild pulmonary vascular congestion is stable. A diffuse interstitial pattern is unchanged. Dense right upper lobe pneumonia persists. Tracheostomy tube is stable in position. The NG tube courses off the inferior border of the film. The NG tube has been advanced since the prior exam.  IMPRESSION: 1. Persistent dense right upper lobe pneumonia. 2. Stable diffuse interstitial pattern, likely representing mild edema.   Electronically Signed   By: Gennette Pac M.D.   On: 02/11/2013 06:43   Dg Abd Portable 1v  02/11/2013   CLINICAL DATA:  Status post administration of barium for gastrostomy tube replacement.  EXAM: PORTABLE ABDOMEN - 1 VIEW  COMPARISON:  One-view abdomen 02/06/2013  FINDINGS: Contrast is seen throughout the ascending, transverse, and proximal descending colon. An NG tube is in place. The bowel gas pattern is unremarkable. The lung  bases are clear. The tip of the NG tube is within the body the stomach.  IMPRESSION: 1. The transverse colon and splenic flexure are well opacified. 2. The NG tube has been advanced.   Electronically Signed   By: Gennette Pac M.D.   On: 02/11/2013 06:45    Disposition:        Discharge Orders   Future Appointments Provider Department Dept Phone   03/06/2013 1:00 PM Delcie Roch [facility] [phone]   03/06/2013 1:30 PM [facility] In[facility] Nurse [place] [facility] [phone]   03/27/2013 1:00 PM Dava Najjar [facility] [phone]   03/27/2013 1:30 PM [facility] Inj Nurse   [facility] [phone]   04/17/2013 1:00 PM Sherrie Mustache [facility] [phone]   04/17/2013 1:30 PM [facility] Inj Nurse [place] [facility] [phone]   05/08/2013 1:00 PM Beverely Pace [facility] [place] [facility] [phone]   05/08/2013 1:30 PM [facility] Inj Nurse [place] [facility] [phone]   05/29/2013 1:00 PM Delcie Roch [place] [facility] [phone]   05/29/2013 1:30 PM [facility] Inj Nurse [place] [facility] [phone]   Future Orders Complete By Expires   Discharge instructions  As directed    Comments:     Diet: NPO Continue tube feeds and advance per Registered Dietitians recs -Would have SLP eval when clinically ready for PMV trials AND swallowing -Wean FIO2 as able.   Increase activity slowly  As directed        Medication List    STOP taking these medications       budesonide-formoterol 160-4.5 MCG/ACT inhaler  Commonly known as:  SYMBICORT     diazepam 5 MG tablet  Commonly known as:  VALIUM     HEMATOGEN FORTE PO     mometasone 50 MCG/ACT nasal spray  Commonly known as:  NASONEX      phenytoin 100 MG ER capsule  Commonly known as:  DILANTIN     predniSONE 20 MG tablet  Commonly known as:  DELTASONE      TAKE these medications       antiseptic oral rinse Liqd  15 mLs by Mouth Rinse route QID.     budesonide 0.25 MG/2ML nebulizer solution  Commonly known as:  PULMICORT  Take 2 mLs (0.25 mg total) by nebulization every 6 (six) hours.     chlorhexidine 0.12 % solution  Commonly known as:  PERIDEX  Use as directed 15 mLs in the mouth or throat 2 (two) times daily.     cyanocobalamin 500 MCG tablet  Take 500 mcg by mouth daily.     darbepoetin 100 MCG/0.5ML Soln injection  Commonly known as:  ARANESP  Inject 0.5 mLs (100 mcg total) into the skin every Wednesday at 6 PM.     dextrose 5 % solution  kvo     feeding supplement (VITAL AF 1.2 CAL) Liqd  Place 1,000 mLs into feeding tube continuous.     fentaNYL 0.05 MG/ML injection  Commonly known as:  SUBLIMAZE  Inject 1-2 mLs (50-100 mcg total) into the vein every 2 (two) hours as needed for severe pain.     folic acid 1 MG tablet  Commonly known as:  FOLVITE  Place 1 tablet (1 mg total) into feeding tube daily.     free water Soln  Place 300 mLs into feeding tube every 4 (four) hours.     furosemide 10 MG/ML injection  Commonly known as:  LASIX  Inject 6 mLs (60 mg total) into the vein 2 (two) times daily.     hydrALAZINE 20 MG/ML injection  Commonly known as:  APRESOLINE  Inject 0.5-2 mLs (10-40 mg total) into the vein every 4 (four) hours as needed (To maintain SBP < 170 mmHg, MAP < 100 mmHg).     HYDROcodone-acetaminophen 5-325 MG per tablet  Commonly known as:  NORCO/VICODIN  Take 1-2 tablets by mouth every 4 (four) hours as needed.     HYDROmorphone 1 MG/ML Soln injection  Commonly known as:  DILAUDID  Inject 1 mL (1 mg  total) into the vein every 2 (two) hours as needed for severe pain.     insulin aspart 100 UNIT/ML injection  Commonly known as:  novoLOG  Inject 0-15 Units into the skin  every 4 (four) hours.     ipratropium 0.02 % nebulizer solution  Commonly known as:  ATROVENT  Take 2.5 mLs (0.5 mg total) by nebulization every 6 (six) hours.     levalbuterol 0.63 MG/3ML nebulizer solution  Commonly known as:  XOPENEX  Take 3 mLs (0.63 mg total) by nebulization every 6 (six) hours.     levalbuterol 0.63 MG/3ML nebulizer solution  Commonly known as:  XOPENEX  Take 3 mLs (0.63 mg total) by nebulization every 3 (three) hours as needed for wheezing or shortness of breath.     loperamide 1 MG/5ML solution  Commonly known as:  IMODIUM  Take 20 mLs (4 mg total) by mouth every 8 (eight) hours.     metoprolol 1 MG/ML injection  Commonly known as:  LOPRESSOR  Inject 2.5-5 mLs (2.5-5 mg total) into the vein every 3 (three) hours as needed (To maintain HR < 115/min).     metoprolol tartrate 25 mg/10 mL Susp  Commonly known as:  LOPRESSOR  Place 10 mLs (25 mg total) into feeding tube 2 (two) times daily.     ondansetron 4 MG/2ML Soln injection  Commonly known as:  ZOFRAN  Inject 2 mLs (4 mg total) into the vein every 4 (four) hours as needed for nausea.     pantoprazole sodium 40 mg/20 mL Pack  Commonly known as:  PROTONIX  Place 20 mLs (40 mg total) into feeding tube daily at 12 noon.     potassium chloride 20 MEQ/15ML (10%) solution  Take 30 mLs (40 mEq total) by mouth every 12 (twelve) hours.     sodium chloride 0.9 % injection  Inject 3 mLs into the vein every 12 (twelve) hours.     sodium chloride 0.9 % SOLN 100 mL with phenytoin 50 MG/ML SOLN 200 mg  Inject 200 mg into the vein every 12 (twelve) hours.         Discharged Condition: fair  Physician Statement:   The Patient was personally examined, the discharge assessment and plan has been personally reviewed and I agree with ACNP Babcock's assessment and plan. > 30 minutes of time have been dedicated to discharge assessment, planning and discharge instructions.   Signed: BABCOCK,PETE 02/12/2013, 2:00  PM

## 2013-02-12 NOTE — Progress Notes (Signed)
Subjective: 62 year old male admitted with respiratory failure secondary to sepsis/PNA. FTT and requested for Gastric tube placement. Patient underwent g-tube placement on 10/6 with no immediate complications. He denies any pain today. He denies any fever or chills.  Objective: Physical Exam: BP 128/70  Pulse 91  Temp(Src) 97.3 F (36.3 C) (Oral)  Resp 17  Ht 6' (1.829 m)  Wt 117 lb 8.1 oz (53.3 kg)  BMI 15.93 kg/m2  SpO2 97%  Abdomen: Soft, NT,ND gastric tube dressing intact, site C/D, no signs of bleeding or drainage. (+) BS  Labs: CBC  Recent Labs  02/09/13 1830 02/11/13 0415  WBC  --  9.4  HGB 7.6* 7.8*  HCT 23.3* 24.6*  PLT  --  244   BMET  Recent Labs  02/11/13 0415 02/12/13 0415  NA 149* 148*  K 4.3 4.9  CL 106 108  CO2 29 25  GLUCOSE 91 106*  BUN 78* 85*  CREATININE 1.23 1.45*  CALCIUM 8.6 8.7   LFT  Recent Labs  02/11/13 0415  PROT 8.2  ALBUMIN 1.8*  AST 21  ALT 20  ALKPHOS 76  BILITOT 0.3   PT/INR No results found for this basename: LABPROT, INR,  in the last 72 hours   Studies/Results: Ir Gastrostomy Tube Mod Sed  02/11/2013   CLINICAL DATA:  Severe malnutrition, needs enteral feeding support.  EXAM: PERC PLACEMENT GASTROSTOMY  TECHNIQUE: The procedure, risks, benefits, and alternatives were explained to the patient. Questions regarding the procedure were encouraged and answered. The patient understands and consents to the procedure. As antibiotic prophylaxis, cefazolin 2 g IV was ordered pre-procedure and administered intravenously within one hour of incision.Progression of previously administered oral barium into the colon was confirmed fluoroscopically. A 5 French angiographic catheter was placed as orogastric tube. The upper abdomen was prepped with Betadine, draped in usual sterile fashion, and infiltrated locally with 1% lidocaine.  Intravenous Fentanyl and Versed were administered as conscious sedation during continuous cardiorespiratory  monitoring by the radiology RN, with a total moderate sedation time of less than 30 minutes.  Stomach was insufflated using air through the orogastric tube. An 39 French sheath needle was advanced percutaneously into the gastric lumen under fluoroscopy. Gas could be aspirated and a small contrast injection confirmed intraluminal spread. The sheath was exchanged over a guidewire for a 9 [place] vascular sheath, through which the snare device was advanced and used to snare a guidewire passed through the orogastric tube. This was withdrawn, and the snare attached to the 20 French pull-through gastrostomy tube, which was advanced antegrade, positioned with the internal bumper securing the anterior gastric wall to the anterior abdominal wall. Small contrast injection confirms appropriate positioning. The external bumper was applied and the catheter was flushed. No immediate complication.  FLUOROSCOPY TIME:  36 seconds  IMPRESSION: 1. Technically successful 20 French pull-through gastrostomy placement under fluoroscopy.   Electronically Signed   By: Oley Balm M.D.   On: 02/11/2013 15:15   Dg Chest Port 1 View  02/11/2013   CLINICAL DATA:  Right upper lobe pneumonia. Shortness of breath.  EXAM: PORTABLE CHEST - 1 VIEW  COMPARISON:  One-view chest 02/06/2013  FINDINGS: The heart size is normal. Mild pulmonary vascular congestion is stable. A diffuse interstitial pattern is unchanged. Dense right upper lobe pneumonia persists. Tracheostomy tube is stable in position. The NG tube courses off the inferior border of the film. The NG tube has been advanced since the prior exam.  IMPRESSION: 1. Persistent  dense right upper lobe pneumonia. 2. Stable diffuse interstitial pattern, likely representing mild edema.   Electronically Signed   By: Gennette Pac M.D.   On: 02/11/2013 06:43   Dg Abd Portable 1v  02/11/2013   CLINICAL DATA:  Status post administration of barium for gastrostomy tube replacement.  EXAM: PORTABLE  ABDOMEN - 1 VIEW  COMPARISON:  One-view abdomen 02/06/2013  FINDINGS: Contrast is seen throughout the ascending, transverse, and proximal descending colon. An NG tube is in place. The bowel gas pattern is unremarkable. The lung bases are clear. The tip of the NG tube is within the body the stomach.  IMPRESSION: 1. The transverse colon and splenic flexure are well opacified. 2. The NG tube has been advanced.   Electronically Signed   By: Gennette Pac M.D.   On: 02/11/2013 06:45    Assessment/Plan: FTT from recent sepsis/respiratory failure. S/p 76F percutaneous gastrostomy tube placement 10/6. Afebrile (+) BS May use Gastric tube now.    LOS: 20 days    Cloretta Ned 02/12/2013 9:41 AM

## 2013-02-13 ENCOUNTER — Ambulatory Visit: Payer: BC Managed Care – PPO

## 2013-02-13 ENCOUNTER — Other Ambulatory Visit: Payer: BC Managed Care – PPO | Admitting: Lab

## 2013-02-13 LAB — GLUCOSE, CAPILLARY: Glucose-Capillary: 96 mg/dL (ref 70–99)

## 2013-02-14 NOTE — Discharge Summary (Signed)
Patient seen and examined, agree with above note.  I dictated the care and orders written for this patient under my direction.  Kayler Rise G Toria Monte, MD [phone] 

## 2013-02-20 ENCOUNTER — Telehealth: Payer: Self-pay | Admitting: Hematology and Oncology

## 2013-02-20 NOTE — Telephone Encounter (Signed)
Returned pt's call to r/s 10/8 lb/inj. Per wife pt is in hosp @ [facility] and is progressing but she is unsure when he will come home. Pt has lb/inj appts but now f/u visit on schedule. Pt last seen 09/19/12 and not pof was sent or attached to office note but pt was to f/u in 4 months. Wife informed and will call us for f/u when pt is d/c'd.

## 2013-02-25 ENCOUNTER — Telehealth: Payer: Self-pay | Admitting: Hematology and Oncology

## 2013-02-25 NOTE — Telephone Encounter (Signed)
Per response from NG re pt cancelling f/u due to being in [facility] hosp she has cx'd all appts and pt will have no further rx until he sees her for f/u when he is ready. S/w wife this morning she has been made aware.

## 2013-03-06 ENCOUNTER — Other Ambulatory Visit: Payer: Self-pay | Admitting: Otolaryngology

## 2013-03-06 ENCOUNTER — Other Ambulatory Visit: Payer: BC Managed Care – PPO | Admitting: Lab

## 2013-03-06 ENCOUNTER — Ambulatory Visit: Payer: BC Managed Care – PPO

## 2013-03-11 DIAGNOSIS — C801 Malignant (primary) neoplasm, unspecified: Secondary | ICD-10-CM

## 2013-03-11 HISTORY — DX: Malignant (primary) neoplasm, unspecified: C80.1

## 2013-03-27 ENCOUNTER — Ambulatory Visit: Payer: BC Managed Care – PPO

## 2013-03-27 ENCOUNTER — Other Ambulatory Visit: Payer: BC Managed Care – PPO | Admitting: Lab

## 2013-04-02 ENCOUNTER — Telehealth: Payer: Self-pay | Admitting: Gastroenterology

## 2013-04-02 NOTE — Telephone Encounter (Signed)
I have explained to the patient that he will need to be referred back to IR by his primary care MD for G-tube removal.  Patient verbalized understanding.

## 2013-04-02 NOTE — Telephone Encounter (Signed)
Patient needs to see his primary care.  Tube was not placed by GI and will need to be removed by who placed it. Tube was placed by IR on 02/11/13 they will need an order from his primary care  To IR to remove.   I have left a message for the wife to call back

## 2013-04-03 ENCOUNTER — Encounter (HOSPITAL_COMMUNITY): Payer: Self-pay | Admitting: Emergency Medicine

## 2013-04-03 ENCOUNTER — Emergency Department (HOSPITAL_COMMUNITY)
Admission: EM | Admit: 2013-04-03 | Discharge: 2013-04-03 | Disposition: A | Discharge status: Home / Self Care | Payer: BC Managed Care – PPO | Attending: Emergency Medicine | Admitting: Emergency Medicine

## 2013-04-03 DIAGNOSIS — G40909 Epilepsy, unspecified, not intractable, without status epilepticus: Secondary | ICD-10-CM | POA: Insufficient documentation

## 2013-04-03 DIAGNOSIS — F17200 Nicotine dependence, unspecified, uncomplicated: Secondary | ICD-10-CM | POA: Insufficient documentation

## 2013-04-03 DIAGNOSIS — J438 Other emphysema: Secondary | ICD-10-CM | POA: Insufficient documentation

## 2013-04-03 DIAGNOSIS — D539 Nutritional anemia, unspecified: Secondary | ICD-10-CM | POA: Insufficient documentation

## 2013-04-03 DIAGNOSIS — Z79899 Other long term (current) drug therapy: Secondary | ICD-10-CM | POA: Insufficient documentation

## 2013-04-03 DIAGNOSIS — Z431 Encounter for attention to gastrostomy: Secondary | ICD-10-CM

## 2013-04-03 NOTE — ED Notes (Signed)
Pt given education and teaching on care of g-tube

## 2013-04-03 NOTE — ED Provider Notes (Signed)
Patient feels well. Denies abdominal pain has been eating normally.Marland Kitchen He requested to have G-tube out today however G-tube cannot come out until several days. Appointment has been arranged with interventional radiology to remove G-tube. Patient and his family member were educated as to how to care for G-tube in the interim.  Doug Sou, MD 04/03/13 2047

## 2013-04-03 NOTE — ED Notes (Signed)
Pt and family member reports pt has a gtube that they would like to get it removed but pcp states he isnt ready to get it removed. Pt no longer does feedings or meds through it. Family was not educated on flushing the tube. They have not flushed it in over a week and tube is getting backed up and clogged, they are unsure what to do about it. No acute distress noted at triage.

## 2013-04-03 NOTE — ED Provider Notes (Signed)
Medical screening examination/treatment/procedure(s) were conducted as a shared visit with non-physician practitioner(s) and myself.  I personally evaluated the patient during the encounter.  EKG Interpretation   None        Doug Sou, MD 04/03/13 2047

## 2013-04-03 NOTE — ED Provider Notes (Signed)
CSN: [id number]     Arrival date & time 04/03/13  1121 History   First MD Initiated Contact with Patient 04/03/13 1225     Chief Complaint  Patient presents with  . GI Problem   (Consider location/radiation/quality/duration/timing/severity/associated sxs/prior Treatment) HPI Comments: 62 yo male presents for evaluation of his G-tube that was place in Oct 2014 when patient was recovering from sepsis/ARDS. He states that he has recovered well and is tolerating po food and fluids without N/V and has not needed to use the tube for any feedings. He has been in rehab and then SNF and recently d/c'd home from SNF with no instruction on how to care for the tube--thought he is not needing it for meds/feedings. He is scheduled to have it removed 04/15/13 and would like to know how to care for the tube in the meantime. Denies fever, chills, abd pain, N/V/D. Endorses small amount of clear d/c around the tube site, denies redness, warmth, swelling, pain to the site.  The history is provided by the patient.    Past Medical History  Diagnosis Date  . Seizures   . Megaloblastic anemia   . Emphysema of lung    Past Surgical History  Procedure Laterality Date  . Abdominal hernia repair     Family History  Problem Relation Age of Onset  . Hypertension Mother    History  Substance Use Topics  . Smoking status: Current Every Day Smoker -- 0.25 packs/day for 15 years    Types: Cigarettes  . Smokeless tobacco: Never Used     Comment: patient states he is slowly quitting on his own - does not want assistance  . Alcohol Use: Yes     Comment: once every three months    Review of Systems  Constitutional: Negative for fever.  HENT: Negative for rhinorrhea and sore throat.   Eyes: Negative for visual disturbance.  Respiratory: Negative for cough and shortness of breath.   Cardiovascular: Negative for chest pain.  Gastrointestinal: Negative for nausea, vomiting and abdominal pain.  Genitourinary:  Negative for difficulty urinating.  Musculoskeletal: Negative for back pain.  Skin: Negative for rash.  Neurological: Negative for headaches.  Hematological: Negative for adenopathy.  Psychiatric/Behavioral: Negative for agitation.    Allergies  Review of patient's allergies indicates no known allergies.  Home Medications   Current Outpatient Rx  Name  Route  Sig  Dispense  Refill  . cyanocobalamin 500 MCG tablet   Oral   Take 500 mcg by mouth daily.         . diazepam (VALIUM) 5 MG tablet   Oral   Take 5 mg by mouth every 6 (six) hours as needed for anxiety.         . folic acid (FOLVITE) 1 MG tablet   Per Tube   Place 1 tablet (1 mg total) into feeding tube daily.         . phenytoin (DILANTIN) 100 MG ER capsule   Oral   Take 200 mg by mouth 2 (two) times daily.          BP 121/65  Pulse 92  Temp(Src) 97.6 F (36.4 C) (Oral)  Resp 18  Ht 6' (1.829 m)  Wt 115 lb 6.4 oz (52.345 kg)  BMI 15.65 kg/m2  SpO2 99% Physical Exam  Nursing note and vitals reviewed. Constitutional: He is oriented to person, place, and time. He appears well-developed and well-nourished.  HENT:  Head: Normocephalic and atraumatic.  Right Ear: External  ear normal.  Left Ear: External ear normal.  Mouth/Throat: Oropharynx is clear and moist.  Eyes: Conjunctivae are normal.  Neck: Normal range of motion. Neck supple.  Cardiovascular: Normal rate and regular rhythm.   Pulmonary/Chest: Effort normal and breath sounds normal.  Abdominal: Soft. Bowel sounds are normal.    Musculoskeletal: Normal range of motion.  Neurological: He is alert and oriented to person, place, and time.  Skin: Skin is warm and dry.  Psychiatric: He has a normal mood and affect.    ED Course  Procedures (including critical care time) Labs Review Labs Reviewed - No data to display Imaging Review No results found.  EKG Interpretation   None       MDM   1. Attention to G-tube    Patient  presents for evaluation of G-tube. Abd soft and nontender. Site appears with some crusty d/c, but no evidence of local cellulitis. Wound cleansed with NS. Will recommend gauze dressing to the protect the skin around the insertion site. Tube flushes easily. Family instructed on how to flush tube PRN. Discussed with IR and patient cannot have the tube removed for a full 8 weeks from time of insertion, which is why the patient is waiting until Dec 8th for it to be removed. Informed patient and family of this and they will confirm their appointment with both the PCP and the IR for the removal on Dec 8th. Stable for d/c. Infection warnings discussed and provided to the patient in writing at time of d/c.      Simmie Davies, NP 04/03/13 1353

## 2013-04-10 ENCOUNTER — Telehealth: Payer: Self-pay | Admitting: Hematology and Oncology

## 2013-04-10 ENCOUNTER — Encounter: Payer: Self-pay | Admitting: Hematology and Oncology

## 2013-04-10 ENCOUNTER — Telehealth: Payer: Self-pay | Admitting: *Deleted

## 2013-04-10 ENCOUNTER — Ambulatory Visit (HOSPITAL_BASED_OUTPATIENT_CLINIC_OR_DEPARTMENT_OTHER): Payer: BC Managed Care – PPO | Admitting: Hematology and Oncology

## 2013-04-10 VITALS — BP 132/72 | HR 108 | Temp 97.7°F | Resp 18 | Ht 72.0 in | Wt 115.4 lb

## 2013-04-10 DIAGNOSIS — D4620 Refractory anemia with excess of blasts, unspecified: Secondary | ICD-10-CM

## 2013-04-10 DIAGNOSIS — F17200 Nicotine dependence, unspecified, uncomplicated: Secondary | ICD-10-CM

## 2013-04-10 DIAGNOSIS — D649 Anemia, unspecified: Secondary | ICD-10-CM

## 2013-04-10 DIAGNOSIS — D531 Other megaloblastic anemias, not elsewhere classified: Secondary | ICD-10-CM

## 2013-04-10 NOTE — Progress Notes (Signed)
[facility] OFFICE PROGRESS NOTE  Adrian Lance, MD DIAGNOSIS:  Chronic anemia  SUMMARY OF HEMATOLOGIC HISTORY: This is a patient with chronic megaloblastic anemia, had multiple bone marrow aspirate and biopsy which rule out myelodysplastic syndrome. He had been getting erythropoietin stimulating agents and B12 replacement therapy from the anemia further followup. In September 2014, the patient had severe pneumonia and was hospitalized. His anemia got worse and he received blood transfusion INTERVAL HISTORY: Adrian [name] 62 y.o. male returns for  further followup. He denies any fatigue. No chest pain or shortness of breath on exertion. The patient was discharged from the skilled nursing facility back to home. He has resumed smoking, is smoking about half a packs of cigarettes per day. The patient denies any recent signs or symptoms of bleeding such as spontaneous epistaxis, hematuria or hematochezia.  I have reviewed the past medical history, past surgical history, social history and family history with the patient and they are unchanged from previous note.  ALLERGIES:  has No Known Allergies.  MEDICATIONS:  Current Outpatient Prescriptions  Medication Sig Dispense Refill  . cyanocobalamin 500 MCG tablet Take 500 mcg by mouth daily.      . diazepam (VALIUM) 5 MG tablet Take 5 mg by mouth every 6 (six) hours as needed for anxiety.      . folic acid (FOLVITE) 1 MG tablet Place 1 tablet (1 mg total) into feeding tube daily.      . phenytoin (DILANTIN) 100 MG ER capsule Take 200 mg by mouth 2 (two) times daily.       No current facility-administered medications for this visit.     REVIEW OF SYSTEMS:   Constitutional: Denies fevers, chills or night sweats Eyes: Denies blurriness of vision Ears, nose, mouth, throat, and face: Denies mucositis or sore throat Respiratory: Denies cough, dyspnea or wheezes Cardiovascular: Denies palpitation, chest discomfort or lower  extremity swelling Gastrointestinal:  Denies nausea, heartburn or change in bowel habits Skin: Denies abnormal skin rashes Lymphatics: Denies new lymphadenopathy or easy bruising Neurological:Denies numbness, tingling or new weaknesses Behavioral/Psych: Mood is stable, no new changes  All other systems were reviewed with the patient and are negative.  PHYSICAL EXAMINATION: ECOG PERFORMANCE STATUS: 1 - Symptomatic but completely ambulatory  Filed Vitals:   04/10/13 1132  BP: 132/72  Pulse: 108  Temp: 97.7 F (36.5 C)  Resp: 18   Filed Weights   04/10/13 1132  Weight: 115 lb 6.4 oz (52.345 kg)    GENERAL:alert, no distress and comfortable. He looks thin and cachectic SKIN: skin color, texture, turgor are normal, no rashes or significant lesions EYES: normal, Conjunctiva are pale and non-injected, sclera clear OROPHARYNX:no exudate, no erythema and lips, buccal mucosa, and tongue normal  NECK: supple, thyroid normal size, non-tender, without nodularity. Previous trach site has resolved LYMPH:  no palpable lymphadenopathy in the cervical, axillary or inguinal LUNGS: clear to auscultation and percussion with normal breathing effort HEART: regular rate & rhythm and no murmurs and no lower extremity edema ABDOMEN:abdomen soft, non-tender and normal bowel sounds. Feeding tube site looks okay Musculoskeletal:no cyanosis of digits and no clubbing  NEURO: alert & oriented x 3 with fluent speech, no focal motor/sensory deficits  LABORATORY DATA:  I have reviewed the data as listed No results found for this or any previous visit (from the past 48 hour(s)).  Lab Results  Component Value Date   WBC 9.4 02/11/2013   HGB 7.8* 02/11/2013   HCT 24.6* 02/11/2013  MCV 104.7* 02/11/2013   PLT 244 02/11/2013   I reviewed CBC from his primary care physician. His hemoglobin is 8.2  ASSESSMENT & PLAN:  #1 severe anemia The patient had no symptoms of anemia. He denies any recent bleeding. I will  watch him carefully with repeat blood work in 2 weeks. At present time he does not require transfusion. With his next visit, I will order an additional workup to rule out hemolysis. His most recent B12 level showed that he has adequate replacement. #2 recent pneumonia He is improving. I recommend the patient to stop smoking. #3 smoking The patient has severe pneumonia requiring ventilatory support recently. He is not interested to quit right now. Smoking cessation advice has been provided.  All questions were answered. The patient knows to call the clinic with any problems, questions or concerns. No barriers to learning was detected.  I spent 25 minutes counseling the patient face to face. The total time spent in the appointment was 40 minutes and more than 50% was on counseling.     [facility], Jatziri Goffredo, MD 04/10/2013 12:35 PM

## 2013-04-10 NOTE — Telephone Encounter (Signed)
Gave pt appt for lab and Md for December 2015 °

## 2013-04-10 NOTE — Telephone Encounter (Signed)
added appt per pof...per Cameo pt aware

## 2013-04-10 NOTE — Telephone Encounter (Signed)
Dr. Bertis Ruddy reviewed CBC from outside Physician and requests office visit asap.  Called wife and she is able to bring pt in today,  Will try to get here as soon as they can.

## 2013-04-11 ENCOUNTER — Other Ambulatory Visit: Payer: Self-pay | Admitting: Otolaryngology

## 2013-04-11 DIAGNOSIS — C329 Malignant neoplasm of larynx, unspecified: Secondary | ICD-10-CM

## 2013-04-12 ENCOUNTER — Encounter (HOSPITAL_COMMUNITY): Payer: Self-pay | Admitting: Dentistry

## 2013-04-12 ENCOUNTER — Ambulatory Visit (HOSPITAL_COMMUNITY): Payer: Self-pay | Admitting: Dentistry

## 2013-04-12 VITALS — BP 108/70 | HR 97 | Temp 97.8°F

## 2013-04-12 DIAGNOSIS — C321 Malignant neoplasm of supraglottis: Secondary | ICD-10-CM

## 2013-04-12 DIAGNOSIS — M2607 Excessive tuberosity of jaw: Secondary | ICD-10-CM

## 2013-04-12 DIAGNOSIS — K0889 Other specified disorders of teeth and supporting structures: Secondary | ICD-10-CM

## 2013-04-12 DIAGNOSIS — M264 Malocclusion, unspecified: Secondary | ICD-10-CM

## 2013-04-12 DIAGNOSIS — K045 Chronic apical periodontitis: Secondary | ICD-10-CM

## 2013-04-12 DIAGNOSIS — K0530 Chronic periodontitis, unspecified: Secondary | ICD-10-CM

## 2013-04-12 DIAGNOSIS — IMO0002 Reserved for concepts with insufficient information to code with codable children: Secondary | ICD-10-CM

## 2013-04-12 DIAGNOSIS — M278 Other specified diseases of jaws: Secondary | ICD-10-CM

## 2013-04-12 DIAGNOSIS — Z8589 Personal history of malignant neoplasm of other organs and systems: Secondary | ICD-10-CM | POA: Insufficient documentation

## 2013-04-12 DIAGNOSIS — Z0189 Encounter for other specified special examinations: Secondary | ICD-10-CM

## 2013-04-12 DIAGNOSIS — K036 Deposits [accretions] on teeth: Secondary | ICD-10-CM

## 2013-04-12 DIAGNOSIS — M270 Developmental disorders of jaws: Secondary | ICD-10-CM

## 2013-04-12 DIAGNOSIS — K08409 Partial loss of teeth, unspecified cause, unspecified class: Secondary | ICD-10-CM

## 2013-04-12 NOTE — Progress Notes (Signed)
DENTAL CONSULTATION  Date of Consultation:  04/12/2013 Patient Name:   Adrian [name] Date of Birth:   25-Dec-1950 Medical Record Number: [id number]  VITALS: BP 108/70  Pulse 97  Temp(Src) 97.8 F (36.6 C) (Oral)   CHIEF COMPLAINT: Patient was referred for a medically necessary pre-chemoradiation therapy dental protocol evaluation.  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 Patient with anticipated chemoradiation therapy. Patient is now seen as part of a medically necessary pre-chemoradiation therapy dental protocol examination.  The patient currently denies having acute toothache, swellings, or abscesses. The patient was last seen by Dr. Assunta Found for an exam and cleaning approximately one to 2 years ago. Patient does not seek regular dental care.  The patient has a lower partial denture but no upper partial denture. The patient is interested in having all remaining teeth extracted at this time.    PROBLEM LIST: Patient Active Problem List   Diagnosis Date Noted  . Squamous cell carcinoma of the epiglottis 04/12/2013    Priority: High  . Tracheostomy status 02/08/2013  . Necrotizing pneumonia 01/25/2013  . Sepsis 01/23/2013  . Elevated troponin 01/23/2013  . Acute renal failure 01/23/2013  . COPD (chronic obstructive pulmonary disease) 01/23/2013  . Acute respiratory failure with hypoxia 01/23/2013  . Right upper lobe pneumonia 01/23/2013  . Dehydration with hyponatremia 01/23/2013  . Elevated d-dimer 01/23/2013  . Leukocytosis 01/23/2013  . Seizure disorder 01/23/2013  . Tobacco abuse 01/23/2013  . Protein-calorie malnutrition, severe 01/23/2013  . Pneumonia, organism unspecified 01/23/2013  . Septic shock(785.52) 01/23/2013  . Severe sepsis(995.92) 01/23/2013  . Anemia, unspecified 03/13/2011  . MDS (myelodysplastic syndrome), low grade 03/13/2011    PMH: Past Medical History  Diagnosis Date   . Seizures   . Megaloblastic anemia   . Emphysema of lung   . Arthritis   . Esophageal reflux     PSH: Past Surgical History  Procedure Laterality Date  . Abdominal hernia repair    . Tracheostomy    . Peg placement      ALLERGIES: No Known Allergies  MEDICATIONS: Current Outpatient Prescriptions  Medication Sig Dispense Refill  . cyanocobalamin 500 MCG tablet Take 500 mcg by mouth daily.      . Darbepoetin Alfa-Polysorbate (ARANESP, ALBUMIN FREE, IJ) Inject as directed.      . diazepam (VALIUM) 5 MG tablet Take 5 mg by mouth every 6 (six) hours as needed for anxiety.      . Fe Fum-Vit C-Vit B12-FA (HEMATOGEN FORTE PO) Take 1 capsule by mouth daily.      . folic acid (FOLVITE) 1 MG tablet Place 1 tablet (1 mg total) into feeding tube daily.      Marland Kitchen nystatin (MYCOSTATIN) 100000 UNIT/ML suspension Use as directed 5 mLs in the mouth or throat 4 (four) times daily.      . phenytoin (DILANTIN) 100 MG ER capsule Take 200 mg by mouth 2 (two) times daily.       No current facility-administered medications for this visit.    LABS: Lab Results  Component Value Date   WBC 9.4 02/11/2013   HGB 7.8* 02/11/2013   HCT 24.6* 02/11/2013   MCV 104.7* 02/11/2013   PLT 244 02/11/2013      Component Value Date/Time   NA 148* 02/12/2013 0415   NA 136 09/19/2012 1325   K 4.9 02/12/2013 0415   K 4.2 09/19/2012 1325   CL 108 02/12/2013 0415  CL 107 09/19/2012 1325   CO2 25 02/12/2013 0415   CO2 18* 09/19/2012 1325   GLUCOSE 106* 02/12/2013 0415   GLUCOSE 97 09/19/2012 1325   BUN 85* 02/12/2013 0415   BUN 13.2 09/19/2012 1325   CREATININE 1.45* 02/12/2013 0415   CREATININE 0.8 09/19/2012 1325   CALCIUM 8.7 02/12/2013 0415   CALCIUM 8.8 09/19/2012 1325   GFRNONAA 49* 02/12/2013 0415   GFRAA 57* 02/12/2013 0415   Lab Results  Component Value Date   INR 1.37 02/03/2013   INR 1.67* 01/23/2013   No results found for this basename: PTT    SOCIAL HISTORY: History   Social History  . Marital Status:  Married    Spouse Name: N/A    Number of Children: 3  . Years of Education: N/A   Occupational History  . Not on file.   Social History Main Topics  . Smoking status: Current Every Day Smoker -- 0.25 packs/day for 15 years    Types: Cigarettes  . Smokeless tobacco: Never Used     Comment: patient states he is slowly quitting on his own - does not want assistance  . Alcohol Use: Yes     Comment: once every three months  . Drug Use: No  . Sexual Activity: Not on file   Other Topics Concern  . Not on file   Social History Narrative   04/12/2013   The patient is married with 3 children. (2 girls and one boy)    The patient has a history of smoking a quarter pack per day for 15 years.   The patient is trying to quit on his own.   Patient has not had any alcohol for the past 6 months by report. Patient usually drinks vodka.          FAMILY HISTORY: Family History  Problem Relation Age of Onset  . Hypertension Mother   . Arthritis/Rheumatoid Mother   . Stroke Mother      REVIEW OF SYSTEMS: Reviewed with the patient included in the dental record.  DENTAL HISTORY: CHIEF COMPLAINT: Patient was referred for a medically necessary pre-chemoradiation therapy dental protocol evaluation.  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 Patient with anticipated chemoradiation therapy. Patient is now seen as part of a medically necessary pre-chemoradiation therapy dental protocol examination.  The patient currently denies having acute toothache, swellings, or abscesses. The patient was last seen by Dr. Assunta Found for an exam and cleaning approximately one to 2 years ago. Patient does not seek regular dental care.  The patient has a lower partial denture but no upper partial denture. The patient is interested in having all remaining teeth extracted at this time.  DENTAL EXAMINATION:  GENERAL: The patient is a  well-developed, slightly built male in no acute distress. HEAD AND NECK: There is no palpable right or left neck lymphadenopathy. INTRAORAL EXAM: The patient has normal saliva. The patient has a mid palatal torus. The patient has a mandibular lingual lateral exostoses. The patient has excessive bilateral maxillary tuberosities. DENTITION: The patient is missing tooth numbers 1, 2, 48, 9, 12, 13, 14, 16, 18, 19, 20, 23, 24, 25, 26, 28, 30, 31, and 32. PERIODONTAL: The patient has chronic advanced periodontal disease with plaque and calculus accumulations, generalized gingival recession, and generalized tooth mobility. DENTAL CARIES/SUBOPTIMAL RESTORATIONS: Patient has several suboptimal dental restorations. ENDODONTIC: The patient has no acute toothache symptoms. The patient does have periapical  pathology associated with tooth numbers 15 and 17. The patient has had a previous root canal therapy associated with tooth number 7. CROWN AND BRIDGE: There are crowns on tooth numbers 5, 7, and 10.  PROSTHODONTIC: The patient has a mandibular partial denture that has less than ideal retention and stability. The patient indicates that he does not have a maxillary partial denture. OCCLUSION: The patient has a poor occlusal scheme secondary to multiple missing teeth, supra-eruption and drifting of the unopposed teeth into the edentulous areas and lack of replacement of the missing teeth with clinically acceptable dental prostheses.  RADIOGRAPHIC INTERPRETATION: An orthopantogram was taken and supplemented with 9 periapical radiographs. There are multiple missing teeth. There is supra-eruption and drifting of the unopposed teeth into the edentulous areas. There multiple areas of periapical pathology and radiolucency associated with tooth numbers 15 and 17. There is moderate to severe bone loss noted. There is radiographic calculus noted. There is previous root canal therapy associated with tooth  #7.  ASSESSMENTS: 1. Squamous cell carcinoma of the epiglottis 2. Pre-chemoradiation therapy dental protocol 3. Chronic apical periodontitis 4. Chronic periodontitis with bone loss 5. Plaque and calculus accumulations 6. Gingival recession 7. Tooth mobility 8. Suboptimal dental restorations 9. Ill fitting mandibular partial denture with no current upper partial denture 10. Bilateral maxillary excessive tuberosities 11. Mid palatal torus 12. Mandibular lingual lateral exostoses 13. Anemia with potential need for pre-operative or perioperative blood transfusion.  PLAN/RECOMMENDATIONS: 1. I discussed the risks, benefits, and complications of various treatment options with the patient in relationship to his medical and dental conditions, anticipated chemoradiation therapy, and chemoradiation  therapy side effects to include xerostomia, radiation caries, trismus, mucositis, taste changes, gum and jawbone changes, and risk for infection, and osteoradionecrosis. We discussed various treatment options to include no treatment, multiple extractions with alveoloplasty, pre-prosthetic surgery as indicated, periodontal therapy, dental restorations, root canal therapy, crown and bridge therapy, implant therapy, and replacement of missing teeth as indicated. The patient currently wishes to proceed with extraction of remaining teeth with alveoloplasty and pre-prosthetic surgery as indicated in the operating room with general anesthesia. This has been scheduled for Tuesday, 04/16/2013 at 9 AM at [facility].  The patient will then be able to proceed with chemoradiation therapy approximately 2 weeks from that date barring any complications. The patient also will followup with a primary dentist of his choice for fabrication of upper lower complete dentures approximately 3 months after the last radiation therapy has been completed.  2. Discussion of findings with medical team and coordination of future  medical and dental care as needed.  I spent 60 minutes face to face with patient and more than 50% of time was spent in counseling and /or coordination of care.   Charlynne Pander, DDS

## 2013-04-12 NOTE — Patient Instructions (Signed)

## 2013-04-15 ENCOUNTER — Encounter (HOSPITAL_COMMUNITY): Payer: Self-pay

## 2013-04-15 ENCOUNTER — Ambulatory Visit (HOSPITAL_COMMUNITY)
Admission: RE | Admit: 2013-04-15 | Discharge: 2013-04-15 | Disposition: A | Discharge status: Home / Self Care | Payer: BC Managed Care – PPO | Source: Ambulatory Visit | Attending: Anesthesiology | Admitting: Anesthesiology

## 2013-04-15 ENCOUNTER — Encounter (HOSPITAL_COMMUNITY)
Admission: RE | Admit: 2013-04-15 | Discharge: 2013-04-15 | Disposition: A | Discharge status: Home / Self Care | Payer: BC Managed Care – PPO | Source: Ambulatory Visit | Attending: Dentistry | Admitting: Dentistry

## 2013-04-15 HISTORY — DX: Personal history of other medical treatment: Z92.89

## 2013-04-15 HISTORY — DX: Shortness of breath: R06.02

## 2013-04-15 HISTORY — DX: Anxiety disorder, unspecified: F41.9

## 2013-04-15 HISTORY — DX: Pneumonia, unspecified organism: J18.9

## 2013-04-15 HISTORY — DX: Malignant (primary) neoplasm, unspecified: C80.1

## 2013-04-15 LAB — BASIC METABOLIC PANEL
CO2: 21 mEq/L (ref 19–32)
Chloride: 106 mEq/L (ref 96–112)
GFR calc non Af Amer: >90 mL/min (ref >90)
Sodium: 137 mEq/L (ref 135–145)

## 2013-04-15 LAB — CBC
MCV: 109.6 fL — ABNORMAL HIGH (ref 78.0–100.0)
Platelets: 338 10*3/uL (ref 150–400)
RBC: 2.4 MIL/uL — ABNORMAL LOW (ref 4.22–5.81)
WBC: 5.6 10*3/uL (ref 4.0–10.5)

## 2013-04-15 LAB — TYPE AND SCREEN
ABO/RH(D): O POS
Antibody Screen: NEGATIVE

## 2013-04-15 LAB — PROTIME-INR: INR: 1.19 (ref 0.00–1.49)

## 2013-04-15 MED ORDER — CEFAZOLIN SODIUM-DEXTROSE 2-3 GM-% IV SOLR
2.0000 g | Freq: Once | INTRAVENOUS | Status: AC
  Administered 2013-04-16: 2 g via INTRAVENOUS
  Filled 2013-04-15: qty 50

## 2013-04-15 NOTE — Progress Notes (Signed)
Dr singer notified of Hgb of 8.6.  Pt with known anemia.

## 2013-04-15 NOTE — Pre-Procedure Instructions (Signed)
Regie Bunner [facility]  04/15/2013   Your procedure is scheduled on:  Tuesday, December 9th.  Report to [facility], Main Entrance/Entrance "A" at 7:00 AM.  Call this number if you have problems the morning of surgery: [phone]   Remember:   Do not eat food or drink liquids after midnight tonight.   Take these medicines the morning of surgery with A SIP OF WATER: Dilantin.  May use Nasonex.             Take Valium if needed. Stop taking Aspirin, Coumadin, Plavix, Effient and Herbal medications.  Do not take any NSAIDs ie: Ibuprofen,  Advil,Naproxen or any medication containing Aspirin.   Do not wear jewelry, make-up or nail polish.  Do not wear lotions, powders, or perfumes. You may wear deodorant.  Do not shave 48 hours prior to surgery. Men may shave face and neck.  Do not bring valuables to the hospital.  [facility] is not responsible  for any belongings or valuables.               Contacts, dentures or bridgework may not be worn into surgery.  Leave suitcase in the car. After surgery it may be brought to your room.  For patients admitted to the hospital, discharge time is determined by your treatment team.               Patients discharged the day of surgery will not be allowed to drive home.  Name and phone number of your driver:-   Special Instructions: Shower with CHG wash (Bactoshield) tonight and again in the am prior to arriving to hospital.   Please read over the following fact sheets that you were given: Pain Booklet, Coughing and Deep Breathing and Surgical Site Infection Prevention

## 2013-04-16 ENCOUNTER — Ambulatory Visit (HOSPITAL_COMMUNITY): Payer: BC Managed Care – PPO | Admitting: Anesthesiology

## 2013-04-16 ENCOUNTER — Encounter (HOSPITAL_COMMUNITY): Payer: BC Managed Care – PPO | Admitting: Anesthesiology

## 2013-04-16 ENCOUNTER — Encounter (HOSPITAL_COMMUNITY): Payer: Self-pay | Admitting: Anesthesiology

## 2013-04-16 ENCOUNTER — Ambulatory Visit (HOSPITAL_COMMUNITY)
Admission: RE | Admit: 2013-04-16 | Discharge: 2013-04-16 | Disposition: A | Discharge status: Home / Self Care | Payer: BC Managed Care – PPO | Source: Ambulatory Visit | Attending: Dentistry | Admitting: Dentistry

## 2013-04-16 ENCOUNTER — Encounter: Payer: Self-pay | Admitting: Radiation Oncology

## 2013-04-16 ENCOUNTER — Encounter (HOSPITAL_COMMUNITY)
Admission: RE | Disposition: A | Discharge status: Home / Self Care | Payer: Self-pay | Source: Ambulatory Visit | Attending: Dentistry

## 2013-04-16 DIAGNOSIS — K0530 Chronic periodontitis, unspecified: Secondary | ICD-10-CM

## 2013-04-16 DIAGNOSIS — K045 Chronic apical periodontitis: Secondary | ICD-10-CM

## 2013-04-16 DIAGNOSIS — M278 Other specified diseases of jaws: Secondary | ICD-10-CM

## 2013-04-16 DIAGNOSIS — M898X9 Other specified disorders of bone, unspecified site: Secondary | ICD-10-CM | POA: Insufficient documentation

## 2013-04-16 DIAGNOSIS — C321 Malignant neoplasm of supraglottis: Secondary | ICD-10-CM | POA: Insufficient documentation

## 2013-04-16 DIAGNOSIS — Z87891 Personal history of nicotine dependence: Secondary | ICD-10-CM | POA: Insufficient documentation

## 2013-04-16 DIAGNOSIS — Z79899 Other long term (current) drug therapy: Secondary | ICD-10-CM | POA: Insufficient documentation

## 2013-04-16 DIAGNOSIS — M2607 Excessive tuberosity of jaw: Secondary | ICD-10-CM

## 2013-04-16 HISTORY — PX: MULTIPLE EXTRACTIONS WITH ALVEOLOPLASTY: SHX5342

## 2013-04-16 SURGERY — MULTIPLE EXTRACTION WITH ALVEOLOPLASTY
Anesthesia: General | Site: Mouth

## 2013-04-16 MED ORDER — ONDANSETRON HCL 4 MG/2ML IJ SOLN
INTRAMUSCULAR | Status: DC | PRN
  Administered 2013-04-16: 4 mg via INTRAVENOUS

## 2013-04-16 MED ORDER — SUCCINYLCHOLINE CHLORIDE 20 MG/ML IJ SOLN
INTRAMUSCULAR | Status: DC | PRN
  Administered 2013-04-16: 100 mg via INTRAVENOUS

## 2013-04-16 MED ORDER — LIDOCAINE-EPINEPHRINE 2 %-1:100000 IJ SOLN
INTRAMUSCULAR | Status: AC
  Filled 2013-04-16: qty 10.2

## 2013-04-16 MED ORDER — ONDANSETRON HCL 4 MG/2ML IJ SOLN: 4.0000 mg | Freq: Once | INTRAMUSCULAR | Status: DC | PRN

## 2013-04-16 MED ORDER — BUPIVACAINE-EPINEPHRINE 0.5% -1:200000 IJ SOLN
INTRAMUSCULAR | Status: DC | PRN
  Administered 2013-04-16 (×2): 1.8 mL

## 2013-04-16 MED ORDER — ARTIFICIAL TEARS OP OINT
TOPICAL_OINTMENT | OPHTHALMIC | Status: DC | PRN
  Administered 2013-04-16: 1 via OPHTHALMIC

## 2013-04-16 MED ORDER — LACTATED RINGERS IV SOLN
INTRAVENOUS | Status: DC
  Administered 2013-04-16: 07:00:00 via INTRAVENOUS

## 2013-04-16 MED ORDER — MIDAZOLAM HCL 5 MG/5ML IJ SOLN
INTRAMUSCULAR | Status: DC | PRN
  Administered 2013-04-16 (×2): 1 mg via INTRAVENOUS

## 2013-04-16 MED ORDER — FENTANYL CITRATE 0.05 MG/ML IJ SOLN
INTRAMUSCULAR | Status: DC | PRN
  Administered 2013-04-16 (×2): 25 ug via INTRAVENOUS
  Administered 2013-04-16 (×2): 50 ug via INTRAVENOUS

## 2013-04-16 MED ORDER — LACTATED RINGERS IV SOLN
INTRAVENOUS | Status: DC | PRN
  Administered 2013-04-16 (×2): via INTRAVENOUS

## 2013-04-16 MED ORDER — LIDOCAINE-EPINEPHRINE 2 %-1:100000 IJ SOLN
INTRAMUSCULAR | Status: DC | PRN
  Administered 2013-04-16 (×6): 1.7 mL via INTRADERMAL

## 2013-04-16 MED ORDER — HYDROMORPHONE HCL PF 1 MG/ML IJ SOLN
INTRAMUSCULAR | Status: AC
  Filled 2013-04-16: qty 1

## 2013-04-16 MED ORDER — PROPOFOL 10 MG/ML IV BOLUS
INTRAVENOUS | Status: DC | PRN
  Administered 2013-04-16: 130 mg via INTRAVENOUS

## 2013-04-16 MED ORDER — LIDOCAINE HCL (CARDIAC) 20 MG/ML IV SOLN
INTRAVENOUS | Status: DC | PRN
  Administered 2013-04-16: 40 mg via INTRAVENOUS

## 2013-04-16 MED ORDER — HYDROMORPHONE HCL PF 1 MG/ML IJ SOLN
0.2500 mg | INTRAMUSCULAR | Status: DC | PRN
  Administered 2013-04-16 (×2): 0.5 mg via INTRAVENOUS

## 2013-04-16 MED ORDER — OXYMETAZOLINE HCL 0.05 % NA SOLN
NASAL | Status: AC
  Filled 2013-04-16: qty 15

## 2013-04-16 MED ORDER — BUPIVACAINE-EPINEPHRINE PF 0.5-1:200000 % IJ SOLN
INTRAMUSCULAR | Status: AC
  Filled 2013-04-16: qty 7.2

## 2013-04-16 MED ORDER — PHENYLEPHRINE HCL 10 MG/ML IJ SOLN
INTRAMUSCULAR | Status: DC | PRN
  Administered 2013-04-16 (×2): 120 ug via INTRAVENOUS
  Administered 2013-04-16: 160 ug via INTRAVENOUS

## 2013-04-16 MED ORDER — 0.9 % SODIUM CHLORIDE (POUR BTL) OPTIME
TOPICAL | Status: DC | PRN
  Administered 2013-04-16: 1000 mL

## 2013-04-16 MED ORDER — PHENYLEPHRINE HCL 10 MG/ML IJ SOLN
10.0000 mg | INTRAVENOUS | Status: DC | PRN
  Administered 2013-04-16: 40 ug/min via INTRAVENOUS

## 2013-04-16 MED ORDER — OXYCODONE-ACETAMINOPHEN 5-325 MG PO TABS: ORAL_TABLET | ORAL | Status: DC

## 2013-04-16 SURGICAL SUPPLY — 34 items
ALCOHOL 70% 16 OZ (MISCELLANEOUS) ×2 IMPLANT
ATTRACTOMAT 16X20 MAGNETIC DRP (DRAPES) ×2 IMPLANT
BLADE SURG 15 STRL LF DISP TIS (BLADE) ×4 IMPLANT
BLADE SURG 15 STRL SS (BLADE) ×4
COVER SURGICAL LIGHT HANDLE (MISCELLANEOUS) ×2 IMPLANT
CRADLE DONUT ADULT HEAD (MISCELLANEOUS) ×2 IMPLANT
GAUZE PACKING FOLDED 2  STR (GAUZE/BANDAGES/DRESSINGS) ×1
GAUZE PACKING FOLDED 2 STR (GAUZE/BANDAGES/DRESSINGS) ×1 IMPLANT
GAUZE SPONGE 4X4 16PLY XRAY LF (GAUZE/BANDAGES/DRESSINGS) ×4 IMPLANT
GLOVE BIOGEL PI IND STRL 6 (GLOVE) IMPLANT
GLOVE BIOGEL PI INDICATOR 6 (GLOVE)
GLOVE SURG ORTHO 8.0 STRL STRW (GLOVE) ×2 IMPLANT
GLOVE SURG SS PI 6.0 STRL IVOR (GLOVE) ×2 IMPLANT
GOWN STRL REIN 3XL LVL4 (GOWN DISPOSABLE) ×2 IMPLANT
HEMOSTAT SURGICEL .5X2 ABSORB (HEMOSTASIS) IMPLANT
KIT BASIN OR (CUSTOM PROCEDURE TRAY) ×2 IMPLANT
KIT ROOM TURNOVER OR (KITS) ×2 IMPLANT
MANIFOLD NEPTUNE WASTE (CANNULA) ×2 IMPLANT
NEEDLE BLUNT 16X1.5 OR ONLY (NEEDLE) ×2 IMPLANT
NEEDLE DENTAL 27 LONG (NEEDLE) IMPLANT
NS IRRIG 1000ML POUR BTL (IV SOLUTION) ×2 IMPLANT
PACK EENT II TURBAN DRAPE (CUSTOM PROCEDURE TRAY) ×2 IMPLANT
PAD ARMBOARD 7.5X6 YLW CONV (MISCELLANEOUS) ×4 IMPLANT
SPONGE SURGIFOAM ABS GEL 100 (HEMOSTASIS) IMPLANT
SPONGE SURGIFOAM ABS GEL 12-7 (HEMOSTASIS) IMPLANT
SPONGE SURGIFOAM ABS GEL SZ50 (HEMOSTASIS) IMPLANT
SUCTION FRAZIER TIP 10 FR DISP (SUCTIONS) ×2 IMPLANT
SUT CHROMIC 3 0 PS 2 (SUTURE) ×4 IMPLANT
SUT CHROMIC 4 0 P 3 18 (SUTURE) ×2 IMPLANT
SYR 50ML SLIP (SYRINGE) ×2 IMPLANT
TOWEL OR 17X26 10 PK STRL BLUE (TOWEL DISPOSABLE) ×2 IMPLANT
TUBE CONNECTING 12X1/4 (SUCTIONS) ×2 IMPLANT
WATER STERILE IRR 1000ML POUR (IV SOLUTION) ×2 IMPLANT
YANKAUER SUCT BULB TIP NO VENT (SUCTIONS) ×2 IMPLANT

## 2013-04-16 NOTE — Op Note (Signed)
Patient:            Adrian [name] Date of Birth:  [birth year] MRN:                [id number]   DATE OF PROCEDURE:  04/16/2013               OPERATIVE REPORT   PREOPERATIVE DIAGNOSES: 1.   Squamous cell carcinoma of the epiglottis 2.   Pre-chemoradiation therapy dental protocol 3.   Chronic periodontitis 4.   Chronic apical periodontitis 5.   Bilateral maxillary excessive tuberosities 6.   Hyperplastic tissues of the mandibular arch 7.   Mandibular left lingual exostoses  POSTOPERATIVE DIAGNOSES: 1.   Squamous cell carcinoma of the epiglottis 2.   Pre-chemoradiation therapy dental protocol 3.   Chronic periodontitis 4.   Chronic apical periodontitis 5.   Bilateral maxillary excessive tuberosities 6.   Hyperplastic tissues of the mandibular arch 7.   Mandibular left lingual exostoses 8.   Maxillary left and maxillary right palatal exostoses  OPERATIONS: 1. Multiple extraction of tooth numbers 2, 5, 6, 7, 10, 11, 15, 17, 21, 22, 27, and 29. 2. 4 Quadrants of alveoloplasty 3. Bilateral maxillary fibrous tuberosity reductions 4. Bilateral maxillary palatal exostoses reductions 5. Mandibular left lingual exostoses reductions 6. Excision of hyperplastic tissues of the mandibular arch   SURGEON: Charlynne Pander, DDS  ASSISTANT: Rory Percy, (dental assistant)  ANESTHESIA: General anesthesia via oral endotracheal tube.  MEDICATIONS: 1. Ancef 2 g IV prior to invasive dental procedures. 2. Local anesthesia with a total utilization of 6 carpules each containing 34 mg of lidocaine with 0.017 mg of epinephrine as well as two carpules each containing 9 mg of bupivacaine with 0.009 mg of epinephrine.  SPECIMENS: There are 12 teeth that were discarded.  DRAINS: None  CULTURES: None  COMPLICATIONS: None   ESTIMATED BLOOD LOSS: 100 mLs.  INTRAVENOUS FLUIDS: 1000 mLs of Lactated ringers solution.  INDICATIONS: The patient was recently diagnosed with squamous cell  carcinoma of the epiglottis.  A dental consultation was then requested prior to anticipated chemoradiation therapy.  The patient was examined and treatment planned for extraction of remaining teeth with alveoloplasty and pre-prosthetic surgery as indicated.  This treatment plan was formulated to decrease the risks and complications associated with dental infection from affecting the patient's systemic health and to prevent future complications such as osteoradionecrosis.  OPERATIVE FINDINGS: Patient was examined operating room number 9.  The teeth were identified for extraction. The patient was noted be affected by chronic periodontitis, chronic apical periodontitis, multiple mobile teeth, bilateral excessive maxillary tuberosities, bilateral maxillary palatal exostoses, hyperplastic tissues of the mandibular arch, and mandibular left lingual exostoses.   DESCRIPTION OF PROCEDURE: Patient was brought to the main operating room number 9. Patient was then placed in the supine position on the operating table. general anesthesia was then induced per the anesthesia team. The patient was then prepped and draped in the usual manner for dental medicine procedure. A timeout was performed. The patient was identified and procedures were verified. A throat pack was placed at this time. The oral cavity was then thoroughly examined with the findings noted above. The patient was then ready for dental medicine procedure as follows:  Local anesthesia was then administered sequentially with a total utilization of 6 carpules each containing 34 mg of lidocaine with 0.017 mg of epinephrine as well as 2 carpules  each containing 9 mg bupivacaine with 0.009 mg of epinephrine.  The Maxillary  left and right quadrants first approached. Anesthesia was then delivered utilizing infiltration with lidocaine with epinephrine. A #15 blade incision was then made from the maxillary right tuberosity and extended to the maxillary left  tuberosity.  A  surgical flap was then carefully reflected. Appropriate amounts of buccal and interseptal bone were then removed utilizing a surgical handpiece and bur and copious amounts of sterile water.  The teeth were then subluxated with a series of straight elevators. Tooth numbers 2, 5, 6, 7, 10, 11, 15 were then removed with a 150 forceps without complications. Alveoloplasty was then performed utilizing a ronguers and bone file. At this point time a series of 15 blade incisions were made to remove the redundant tissues associated with the fibrous tuberosity reductions of the maxillary right and maxillary left quadrants. The tissues were then further approximated and trimmed appropriately. At this point time, the maxillary left and maxillary right palatal exostoses were noted after further flap reduction on the palatal aspects.  These were then reduced utilizing a Rongeurs and bone file appropriately. The tissues were then further trimmed as indicated. The surgical sites were then irrigated with copious amounts of sterile saline times 4.  The maxillary left surgical site was then closed from the maxillary left tuberosity and extended the mesial #9 utilizing 3-0 chromic gut suture in a continuous interrupted technique x1. The surgical site was then further closed utilizing 4-0 chromic gut material and 3 interrupted sutures in the area of #15. The maxillary right surgical site was then closed from the maxillary right tuberosity and extended to the mesial #8 utilizing 3-0 chromic gut suture in a continuous interrupted suture technique x1. One individual interrupted suture was then placed in the molar area to further closed surgical site utilizing 3-0 chromic gut material.  At this point time, the mandibular quadrants were approached. The patient was given bilateral inferior alveolar nerve blocks and long buccal nerve blocks utilizing the bupivacaine with epinephrine. Further infiltration was then achieved  utilizing the lidocaine with epinephrine. A 15 blade incision was then made from the distal of number #17 and extended to the distal of #31. Surgical flaps were then carefully reflected. Appropriate amounts of buccal and interseptal bone were then removed utilizing a surgical handpiece and copious amount of sterile water. Tooth numbers  17, 21, 22, 27, and 29 were then subluxated and removed with a 151 forceps without complications. Alveoloplasty was then performed utilizing a rongeurs and bone file. At this point time the mandibular left lingual exostoses were noted and removed with a rongeur and bone file appropriately.  The hyperplastic alveolar ridge tissues were then removed utilizing a series of 15 blade excisons involving the mandibular left and mandibular right quadrants. The surgical site was then irrigated with copious of sterile saline x4. The tissues were then approximated and trimmed further as needed. The surgical sites were then again irrigated with copious amounts of sterile saline. The mandibular left surgical site was then closed from the distal of  17 to the mesial of 24 utilizing 3-0 chromic gut material in a continuous interrupted suture technique x1.  The mandibular right surgical site was then closed from the distal of 31 and extended to the mesial of #25 utilizing 3-0 chromic gut suture in a continuous interrupted suture technique x1. 4 interrupted sutures were then placed to further closed surgical site utilizing 3-0 chromic gut material.  At this point time, the entire mouth was irrigated with copious amounts of sterile saline. The patient was  examined for complications, seeing none, the dental medicine procedure was deemed to be complete. The throat pack was removed at this time. A series of 4 x 4 gauze were placed in the mouth to aid hemostasis. An oral airway was then placed at the request of the anesthesia teamThe patient was then handed over to the anesthesia team for final  disposition. After an appropriate amount of time, the patient was extubated and taken to the postanesthsia care unit with stable vital signs and a good condition. All counts were correct for the dental medicine procedure. Patient was given a prescription for Percocet pain medication and return to clinic in approximately 7-10 days for evaluation for suture removal.  Patient should be able to start radiation therapy in approximately 2 weeks from this date barring any complications.   Charlynne Pander, DDS.

## 2013-04-16 NOTE — Anesthesia Postprocedure Evaluation (Signed)
  Anesthesia Post-op Note  Patient: Adrian [name] [facility]  Procedure(s) Performed: Procedure(s): Extraction of tooth #'s 2,5,6,7,10,11,15,17,21,22,27,29 with alveoloplasty, bilateral maxillary fibrous tuberosity reductions, removal of excess tissues of mandibular arch, and mandibular left lingual exostoses reductions. (N/A)  Patient Location: PACU  Anesthesia Type:General  Level of Consciousness: awake, alert  and oriented  Airway and Oxygen Therapy: Patient Spontanous Breathing  Post-op Pain: mild  Post-op Assessment: Post-op Vital signs reviewed, Patient's Cardiovascular Status Stable, Respiratory Function Stable, Patent Airway and Pain level controlled  Post-op Vital Signs: stable  Complications: No apparent anesthesia complications

## 2013-04-16 NOTE — Preoperative (Signed)
Beta Blockers   Reason not to administer Beta Blockers:Not Applicable 

## 2013-04-16 NOTE — Transfer of Care (Signed)
Immediate Anesthesia Transfer of Care Note  Patient: Adrian [name] [facility]  Procedure(s) Performed: Procedure(s): Extraction of tooth #'s 2,5,6,7,10,11,15,17,21,22,27,29 with alveoloplasty, bilateral maxillary fibrous tuberosity reductions, removal of excess tissues of mandibular arch, and mandibular left lingual exostoses reductions. (N/A)  Patient Location: PACU  Anesthesia Type:General  Level of Consciousness: awake, alert  and oriented  Airway & Oxygen Therapy: Patient Spontanous Breathing and Patient connected to nasal cannula oxygen  Post-op Assessment: Report given to PACU RN and Post -op Vital signs reviewed and stable  Post vital signs: Reviewed and stable  Complications: No apparent anesthesia complications

## 2013-04-16 NOTE — Anesthesia Procedure Notes (Signed)
Procedure Name: Intubation Date/Time: 04/16/2013 9:15 AM Performed by: Gayla Medicus Pre-anesthesia Checklist: Patient identified, Patient being monitored, Emergency Drugs available, Timeout performed and Suction available Patient Re-evaluated:Patient Re-evaluated prior to inductionOxygen Delivery Method: Circle system utilized Preoxygenation: Pre-oxygenation with 100% oxygen Intubation Type: IV induction Laryngoscope Size: Mac and 3 Grade View: Grade I Tube type: Oral Tube size: 7.0 mm Number of attempts: 1 Airway Equipment and Method: Stylet and Video-laryngoscopy Placement Confirmation: ETT inserted through vocal cords under direct vision,  positive ETCO2 and breath sounds checked- equal and bilateral Secured at: 22 cm Tube secured with: Tape Dental Injury: Teeth and Oropharynx as per pre-operative assessment

## 2013-04-16 NOTE — Progress Notes (Signed)
PRE-OPERATIVE NOTE:  04/16/2013 [place] [id number]  VITALS: BP 120/65  Pulse 77  Temp(Src) 97.7 F (36.5 C) (Oral)  Resp 18  SpO2 97%  Lab Results  Component Value Date   WBC 5.6 04/15/2013   HGB 8.6* 04/15/2013   HCT 26.3* 04/15/2013   MCV 109.6* 04/15/2013   PLT 338 04/15/2013   BMET    Component Value Date/Time   NA 137 04/15/2013 1346   NA 136 09/19/2012 1325   K 4.4 04/15/2013 1346   K 4.2 09/19/2012 1325   CL 106 04/15/2013 1346   CL 107 09/19/2012 1325   CO2 21 04/15/2013 1346   CO2 18* 09/19/2012 1325   GLUCOSE 89 04/15/2013 1346   GLUCOSE 97 09/19/2012 1325   BUN 19 04/15/2013 1346   BUN 13.2 09/19/2012 1325   CREATININE 0.80 04/15/2013 1346   CREATININE 0.8 09/19/2012 1325   CALCIUM 8.8 04/15/2013 1346   CALCIUM 8.8 09/19/2012 1325   GFRNONAA >90 04/15/2013 1346   GFRAA >90 04/15/2013 1346    Lab Results  Component Value Date   INR 1.19 04/15/2013   INR 1.37 02/03/2013   INR 1.67* 01/23/2013   No results found for this basename: PTT     Milda Smart presents for extraction remaining teeth with alveoloplasty and pre-prosthetic surgery as indicated in the operating room with general anesthesia.  SUBJECTIVE: The patient denies any acute medical or dental changes and agrees to proceed with treatment as planned.  EXAM: No sign of acute dental changes.  ASSESSMENT: Patient is affected by squamous cell carcinoma the epiglottis, pre-chemoradiation therapy, chronic apical periodontitis, chronic periodontitis, tooth mobility, and malocclusion  PLAN: Patient agrees to proceed with treatment as planned in the operating room as previously discussed and accepts the risks, benefits, complications of the proposed treatment to include airway compromise with anesthesia, infection, sinus involvement, bleeding, bruising, swelling, nerve damage, stretching the corners of the mouth, needle separation, and mandible fracture. This does not exclude other potential  complications.   Charlynne Pander, DDS

## 2013-04-16 NOTE — H&P (Signed)
04/16/2013  Patient:            Adrian [name] Date of Birth:  05/15/1950 MRN:                [id number]  Patient denies having any acute medical or dental changes. Recent hemoglobin levels are less than ideal but acceptable for surgery. Please see Dr. Maxine Glenn note of 04/10/2013 to use as history and physical for the dental operating room procedures. Charlynne Pander, DDS        [facility]  OFFICE PROGRESS NOTE  [name], MD  DIAGNOSIS:  Chronic anemia  SUMMARY OF HEMATOLOGIC HISTORY:  This is a patient with chronic megaloblastic anemia, had multiple bone marrow aspirate and biopsy which rule out myelodysplastic syndrome. He had been getting erythropoietin stimulating agents and B12 replacement therapy from the anemia further followup. In September 2014, the patient had severe pneumonia and was hospitalized. His anemia got worse and he received blood transfusion  INTERVAL HISTORY:  Adrian [name] 62 y.o. male returns for further followup. He denies any fatigue. No chest pain or shortness of breath on exertion.  The patient was discharged from the skilled nursing facility back to home.  He has resumed smoking, is smoking about half a packs of cigarettes per day.  The patient denies any recent signs or symptoms of bleeding such as spontaneous epistaxis, hematuria or hematochezia.  I have reviewed the past medical history, past surgical history, social history and family history with the patient and they are unchanged from previous note.  ALLERGIES: has No Known Allergies.  MEDICATIONS:    Current Outpatient Prescriptions    Medication  Sig  Dispense  Refill    .  cyanocobalamin 500 MCG tablet  Take 500 mcg by mouth daily.      .  diazepam (VALIUM) 5 MG tablet  Take 5 mg by mouth every 6 (six) hours as needed for anxiety.      .  folic acid (FOLVITE) 1 MG tablet  Place 1 tablet (1 mg total) into feeding tube daily.      .  phenytoin (DILANTIN) 100 MG ER  capsule  Take 200 mg by mouth 2 (two) times daily.      No current facility-administered medications for this visit.     REVIEW OF SYSTEMS:  Constitutional: Denies fevers, chills or night sweats  Eyes: Denies blurriness of vision  Ears, nose, mouth, throat, and face: Denies mucositis or sore throat  Respiratory: Denies cough, dyspnea or wheezes  Cardiovascular: Denies palpitation, chest discomfort or lower extremity swelling  Gastrointestinal: Denies nausea, heartburn or change in bowel habits  Skin: Denies abnormal skin rashes  Lymphatics: Denies new lymphadenopathy or easy bruising  Neurological:Denies numbness, tingling or new weaknesses  Behavioral/Psych: Mood is stable, no new changes  All other systems were reviewed with the patient and are negative.  PHYSICAL EXAMINATION:  ECOG PERFORMANCE STATUS: 1 - Symptomatic but completely ambulatory     Filed Vitals:      04/10/13 1132     BP:  132/72     Pulse:  108     Temp:  97.7 F (36.5 C)     Resp:  18     Filed Weights      04/10/13 1132     Weight:  115 lb 6.4 oz (52.345 kg)     GENERAL:alert, no distress and comfortable. He looks thin and cachectic  SKIN: skin color, texture, turgor are normal, no  rashes or significant lesions  EYES: normal, Conjunctiva are pale and non-injected, sclera clear  OROPHARYNX:no exudate, no erythema and lips, buccal mucosa, and tongue normal  NECK: supple, thyroid normal size, non-tender, without nodularity. Previous trach site has resolved  LYMPH: no palpable lymphadenopathy in the cervical, axillary or inguinal  LUNGS: clear to auscultation and percussion with normal breathing effort  HEART: regular rate & rhythm and no murmurs and no lower extremity edema  ABDOMEN:abdomen soft, non-tender and normal bowel sounds. Feeding tube site looks okay  Musculoskeletal:no cyanosis of digits and no clubbing  NEURO: alert & oriented x 3 with fluent speech, no focal motor/sensory deficits  LABORATORY DATA:   I have reviewed the data as listed  No results found for this or any previous visit (from the past 48 hour(s)).     Lab Results     Component  Value  Date      WBC  9.4  02/11/2013      HGB  7.8*  02/11/2013      HCT  24.6*  02/11/2013      MCV  104.7*  02/11/2013      PLT  244  02/11/2013     I reviewed CBC from his primary care physician. His hemoglobin is 8.2  ASSESSMENT & PLAN:  #1 severe anemia  The patient had no symptoms of anemia. He denies any recent bleeding.  I will watch him carefully with repeat blood work in 2 weeks. At present time he does not require transfusion.  With his next visit, I will order an additional workup to rule out hemolysis.  His most recent B12 level showed that he has adequate replacement.  #2 recent pneumonia  He is improving. I recommend the patient to stop smoking.  #3 smoking  The patient has severe pneumonia requiring ventilatory support recently. He is not interested to quit right now. Smoking cessation advice has been provided.  All questions were answered. The patient knows to call the clinic with any problems, questions or concerns. No barriers to learning was detected.  I spent 25 minutes counseling the patient face to face. The total time spent in the appointment was 40 minutes and more than 50% was on counseling.  [facility], NI, MD  04/10/2013 12:35 PM

## 2013-04-16 NOTE — Anesthesia Preprocedure Evaluation (Signed)
Anesthesia Evaluation  Patient identified by MRN, date of birth, ID band Patient awake    Reviewed: Allergy & Precautions, H&P , NPO status , Patient's Chart, lab work & pertinent test results  Airway Mallampati: II TM Distance: >3 FB Neck ROM: Full    Dental  (+) Poor Dentition   Pulmonary Current Smoker,  breath sounds clear to auscultation        Cardiovascular Rhythm:Regular     Neuro/Psych    GI/Hepatic   Endo/Other    Renal/GU      Musculoskeletal   Abdominal   Peds  Hematology   Anesthesia Other Findings   Reproductive/Obstetrics                           Anesthesia Physical Anesthesia Plan  ASA: III  Anesthesia Plan: General   Post-op Pain Management:    Induction: Intravenous  Airway Management Planned: Oral ETT  Additional Equipment:   Intra-op Plan:   Post-operative Plan: Extubation in OR  Informed Consent: I have reviewed the patients History and Physical, chart, labs and discussed the procedure including the risks, benefits and alternatives for the proposed anesthesia with the patient or authorized representative who has indicated his/her understanding and acceptance.     Plan Discussed with: CRNA and Anesthesiologist  Anesthesia Plan Comments: (Squamous cell ca of supraglottic region without airway obstructive symptoms S/P prolonged hospitalization due to pneumonia required tracheostomy Megaloblastic anemia with chronic anemia Smoker/COPD  Plan GA wit oral ETT and glide scope.  Kipp Brood)        Anesthesia Quick Evaluation

## 2013-04-17 ENCOUNTER — Other Ambulatory Visit: Payer: BC Managed Care – PPO | Admitting: Lab

## 2013-04-17 ENCOUNTER — Ambulatory Visit
Admission: RE | Admit: 2013-04-17 | Discharge: 2013-04-17 | Disposition: A | Discharge status: Home / Self Care | Payer: BC Managed Care – PPO | Source: Ambulatory Visit | Attending: Radiation Oncology | Admitting: Radiation Oncology

## 2013-04-17 ENCOUNTER — Ambulatory Visit: Payer: BC Managed Care – PPO

## 2013-04-18 ENCOUNTER — Encounter (HOSPITAL_COMMUNITY): Payer: Self-pay | Admitting: Dentistry

## 2013-04-18 ENCOUNTER — Ambulatory Visit: Payer: BC Managed Care – PPO

## 2013-04-18 ENCOUNTER — Ambulatory Visit: Payer: BC Managed Care – PPO | Admitting: Radiation Oncology

## 2013-04-19 NOTE — Progress Notes (Signed)
Head and Neck Cancer Location of Tumor / Histology:Squmaous cell carcinoma of epiglottis.   Patient presented 2 months ago he had pneumonia and sepsis requiring tracheostomy   Biopsies of epiglottis on 03/11/2013   Invasive squamous cell carcinoma  Nutrition Status:  Weight changes:not significant  Swallowing status:Po status is fine.  Able to eat soft foods due to tooth extractions. Plans, if any, for PEG tube:Gastrostomy tube in place.  Tobacco/Marijuana/Snuff/ETOH XBJ:YNWGNFA history of smoking over 40 years.Patient quit last week.   Past/Anticipated interventions by otolaryngology, if OZH:YQMVHQ of epiglottis.Scheduled CT of neck and chest.   Past/Anticipated interventions by medical oncology, if any:New consultation 04/23/13.   Referrals yet, to any of the following?   Social Work? no Dentistry? Yes - Dr. Ileene Hutchinson tomorrow  Swallowing therapy? no Nutrition? no  Med/Onc? Seen by Dr.Gorsuch for mega blastic anemia on 04/10/13 , now scheduled for 04/23/13 and ct of neck and chest on 04/25/13.  PEG placement? Gastrostomy tube place October 2014 to remain in place until treatment.No swallowing difficulties at present.  SAFETY ISSUES:  Prior radiation? no Pacemaker/ICD?  Possible current pregnancy? No  Is the patient on methotrexate? no  Current Complaints / other details:Patient is married has 3 children.Patient was hospitalized in October of 2014 with sepsis and ARDs leading to placement of tracheostomy and gastrostomy tube placement.Recently discharged from SNF/Rehab.Flexible laryngoscopy on June 15, 2012 negative for masses or lesions.  Here with his wife.

## 2013-04-22 ENCOUNTER — Ambulatory Visit
Admission: RE | Admit: 2013-04-22 | Discharge: 2013-04-22 | Disposition: A | Discharge status: Home / Self Care | Payer: BC Managed Care – PPO | Source: Ambulatory Visit | Attending: Radiation Oncology | Admitting: Radiation Oncology

## 2013-04-22 ENCOUNTER — Encounter: Payer: Self-pay | Admitting: Radiation Oncology

## 2013-04-22 VITALS — BP 119/71 | HR 93 | Temp 98.4°F | Ht 72.0 in | Wt 118.4 lb

## 2013-04-22 DIAGNOSIS — C321 Malignant neoplasm of supraglottis: Secondary | ICD-10-CM | POA: Insufficient documentation

## 2013-04-22 DIAGNOSIS — R569 Unspecified convulsions: Secondary | ICD-10-CM | POA: Insufficient documentation

## 2013-04-22 DIAGNOSIS — F411 Generalized anxiety disorder: Secondary | ICD-10-CM | POA: Insufficient documentation

## 2013-04-22 DIAGNOSIS — Z79899 Other long term (current) drug therapy: Secondary | ICD-10-CM | POA: Insufficient documentation

## 2013-04-22 DIAGNOSIS — Z931 Gastrostomy status: Secondary | ICD-10-CM | POA: Insufficient documentation

## 2013-04-22 DIAGNOSIS — J438 Other emphysema: Secondary | ICD-10-CM | POA: Insufficient documentation

## 2013-04-22 DIAGNOSIS — Z87891 Personal history of nicotine dependence: Secondary | ICD-10-CM | POA: Insufficient documentation

## 2013-04-22 MED ORDER — LARYNGOSCOPY SOLUTION RAD-ONC
15.0000 mL | Freq: Once | TOPICAL | Status: AC
  Administered 2013-04-22: 15 mL via TOPICAL
  Filled 2013-04-22: qty 15

## 2013-04-22 NOTE — Progress Notes (Signed)
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 male who is seen out courtesy of Dr. Pollyann Kennedy for an opinion concerning radiation therapy as part of the management of patient's recently diagnosed squamous cell carcinoma of the epiglottis. Earlier this fall the patient was admitted to the hospital with severe pneumonia and sepsis. He required tracheostomy as well as vent support. During the patient's evaluation he was noted to have a lesion along the epiglottis and a biopsy was performed which revealed invasive squamous cell carcinoma. Patient's situation  eventually improved and he was subsequently discharged.  his tracheostomy in the lower neck was removed. The patient was seen by Dr. Pollyann Kennedy on 04/10/2013. Patient was noted to have a defect in the center of the epiglottis with no significant exophytic mass. Patient was seen by dental medicine and has proceeded with full mouth extraction of his remaining teeth. He has been scheduled for a CT scan of the neck. With these findings the patient is now seen in radiation oncology for further evaluation and consideration for treatment.Marland Kitchen  PREVIOUS RADIATION THERAPY: No  PAST MEDICAL HISTORY:  has a past medical history of Megaloblastic anemia; Emphysema of lung; Arthritis; Esophageal reflux; Anxiety; Shortness of breath; Pneumonia (01/2013); Seizures; History of blood transfusion; and Cancer (03/11/2013).    PAST SURGICAL HISTORY: Past Surgical History  Procedure Laterality Date  . Abdominal hernia repair    . Tracheostomy  2014  . Peg placement  2014  . Colon surgery    . Multiple extractions with alveoloplasty N/A  04/16/2013    Procedure: Extraction of tooth #'s 2,5,6,7,10,11,15,17,21,22,27,29 with alveoloplasty, bilateral maxillary fibrous tuberosity reductions, removal of excess tissues of mandibular arch, and mandibular left lingual exostoses reductions.;  Surgeon: Charlynne Pander, DDS;  Location: [facility] OR;  Service: Oral Surgery;  Laterality: N/A;    FAMILY HISTORY: family history includes Arthritis/Rheumatoid in his mother; Hypertension in his mother; Stroke in his mother.  SOCIAL HISTORY:  reports that he quit smoking 7 days ago. His smoking use included Cigarettes. He has a 40 pack-year smoking history. He has never used smokeless tobacco. He reports that he does not drink alcohol or use illicit drugs.  ALLERGIES: Review of patient's allergies indicates no known allergies.  MEDICATIONS:  Current Outpatient Prescriptions  Medication Sig Dispense Refill  . cyanocobalamin 500 MCG tablet Take 500 mcg by mouth daily.      . Darbepoetin Alfa-Polysorbate (ARANESP, ALB FREE, SURECLICK IJ) Inject 0.2 mLs as directed every 21 ( twenty-one) days. Aranesp 500 MCG/ML SOLN   Active      . diazepam (VALIUM) 5 MG tablet Take 5 mg by mouth every 6 (six) hours as needed for anxiety.      . Fe Fum-Vit C-Vit B12-FA (HEMATOGEN FORTE PO) Take 1 capsule by mouth daily.      . folic acid (FOLVITE) 1 MG tablet Place 1 tablet (1 mg total) into feeding tube daily.      . mometasone (NASONEX) 50 MCG/ACT nasal spray Place 2 sprays into both nostrils daily.      Marland Kitchen nystatin (MYCOSTATIN) 100000 UNIT/ML suspension Use as directed 5 mLs in the mouth or throat 4 (four) times daily.      Marland Kitchen  phenytoin (DILANTIN) 100 MG ER capsule Take 200 mg by mouth 2 (two) times daily.      . vitamin E 1000 UNIT capsule Take 500 Units by mouth daily.      Marland Kitchen oxyCODONE-acetaminophen (PERCOCET) 5-325 MG per tablet Take one or two tablets by mouth every 4-6 hours as needed for pain.  40 tablet  0   No current facility-administered medications for this  encounter.    REVIEW OF SYSTEMS:  A 15 point review of systems is documented in the electronic medical record. This was obtained by the nursing staff. However, I reviewed this with the patient to discuss relevant findings and make appropriate changes.  He denies any otalgia,  pain with swallowing or difficulty with swallowing. Denies any blood-tinged sputum. He does have some fatigue. He denies any breathing problems at this time.  he denies any problems with aspiration. The patient denies  bony pain headaches dizziness or blurred vision.   PHYSICAL EXAM:  height is 6' (1.829 m) and weight is 118 lb 6.4 oz (53.706 kg). His temperature is 98.4 F (36.9 C). His blood pressure is 119/71 and his pulse is 93. His oxygen saturation is 100%.   BP 119/71  Pulse 93  Temp(Src) 98.4 F (36.9 C)  Ht 6' (1.829 m)  Wt 118 lb 6.4 oz (53.706 kg)  BMI 16.05 kg/m2  SpO2 100%  General Appearance:    Alert, cooperative, no distress, appears stated age, somewhat of an emaciated appearance, temporal wasting, accompanied by his wife   Head:    Normocephalic, without obvious abnormality, atraumatic  Eyes:    PERRL, conjunctiva/corneas clear, EOM's intact       Ears:    Normal TM's and external ear canals, both ears  Nose:   Nares normal, septum midline, mucosa normal, no drainage    or sinus tenderness  Throat:   Lips, mucosa, and tongue normal; status post recent full mouth extraction. Some sutures remain in place. No obvious signs of infection along the gumline  Neck:   Supple, symmetrical, trachea midline, no adenopathy;       thyroid:  No enlargement/tenderness/nodules; no carotid   bruit or JVD, faint scar in the lower anterior neck from recent tracheostomy placement   Back:     Symmetric, no curvature, ROM normal, no CVA tenderness  Lungs:     Clear to auscultation bilaterally, respirations unlabored  Chest wall:    No tenderness or deformity  Heart:    Regular rate and rhythm, S1 and S2 normal, no  murmur, rub   or gallop  Abdomen:     Soft, non-tender, bowel sounds active all four quadrants,    no masses, no organomegaly, PEG in place        Extremities:   Extremities normal, atraumatic, no cyanosis or edema  Pulses:   2+ and symmetric all extremities  Skin:   Skin color, texture, turgor normal, no rashes or lesions  Lymph nodes:   Cervical, supraclavicular, and axillary nodes normal  Neurologic:  . Normal strength     The patient proceeded to undergo a indirect mirror examination which was suboptimal in light of his gag reflux.  the patient then had a topical anesthetic and decongestant placed in the nasal cavities. He did undergo fiberoptic exam through the right nasal cavity. There was asymmetry noted to the epiglottis without any exophytic lesion.. The epiglottis appeared to be somewhat swollen. The vocal cords moved well on examination. There were no other mucosal lesions  noted in the larynx or throat region.      ECOG = 1  1 - Symptomatic but completely ambulatory (Restricted in physically strenuous activity but ambulatory and able to carry out work of a light or sedentary nature. For example, light housework, office work)   LABORATORY DATA:  Lab Results  Component Value Date   WBC 5.6 04/15/2013   HGB 8.6* 04/15/2013   HCT 26.3* 04/15/2013   MCV 109.6* 04/15/2013   PLT 338 04/15/2013   Lab Results  Component Value Date   NA 137 04/15/2013   K 4.4 04/15/2013   CL 106 04/15/2013   CO2 21 04/15/2013   Lab Results  Component Value Date   ALT 20 02/11/2013   AST 21 02/11/2013   ALKPHOS 76 02/11/2013   BILITOT 0.3 02/11/2013     RADIOGRAPHY: Dg Chest 2 View  04/15/2013   CLINICAL DATA:  Preoperative evaluation for dental surgery, history hypertension, smoking, emphysema, epiglottic cancer  EXAM: CHEST  2 VIEW  COMPARISON:  02/11/2013  FINDINGS: Normal heart size, mediastinal contours, and pulmonary vascularity.  Emphysematous changes consistent with COPD.  Scattered chronic  interstitial prominence stable.  Interval decrease in right upper lobe opacity and infiltrate.  Decreased pleural thickening at right apex.  Linear subsegmental atelectasis medial left lower lobe.  No new infiltrate, pleural effusion or pneumothorax otherwise identified.  IMPRESSION: Improving post inflammatory changes at the right upper lobe.  Underlying COPD and chronic interstitial lung disease with linear subsegmental atelectasis left lower lobe.   Electronically Signed   By: Ulyses Southward M.D.   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  the patient has no obvious adenopathy in the neck on clinical exam. He'll proceed with CT scan later this week. Patient would be a good candidate for a definitive course of radiation therapy directed at the neck region. Discussed the treatment course side effects and potential toxicities of radiation therapy in this situation with the patient and his wife. He appears to understand and wishes to proceed with planned course of treatment. The patient will keep his feeding tube in throughout course of treatment in case this is needed for nutrition and hydration purposes. I anticipate 6-1/2-7 weeks of radiation therapy as part of his management.  PLAN: Simulation and planning early next week after the patient has completed his neck CT scan.  I spent 60 minutes minutes face to face with the patient and more than 50% of that time was spent in counseling and/or coordination of care.   ------------------------------------------------  -----------------------------------  Billie Lade, PhD, MD

## 2013-04-22 NOTE — Progress Notes (Signed)
Please see the Nurse Progress Note in the MD Initial Consult Encounter for this patient. 

## 2013-04-23 ENCOUNTER — Encounter (HOSPITAL_COMMUNITY): Payer: Self-pay | Admitting: Dentistry

## 2013-04-23 ENCOUNTER — Ambulatory Visit (HOSPITAL_COMMUNITY): Payer: BC Managed Care – PPO | Admitting: Dentistry

## 2013-04-23 ENCOUNTER — Other Ambulatory Visit (HOSPITAL_BASED_OUTPATIENT_CLINIC_OR_DEPARTMENT_OTHER): Payer: BC Managed Care – PPO

## 2013-04-23 ENCOUNTER — Ambulatory Visit (HOSPITAL_BASED_OUTPATIENT_CLINIC_OR_DEPARTMENT_OTHER): Payer: BC Managed Care – PPO | Admitting: Hematology and Oncology

## 2013-04-23 ENCOUNTER — Telehealth: Payer: Self-pay | Admitting: Hematology and Oncology

## 2013-04-23 VITALS — BP 115/76 | HR 91 | Temp 98.3°F

## 2013-04-23 VITALS — BP 113/66 | HR 100 | Temp 98.6°F | Resp 18 | Ht 72.0 in | Wt 117.5 lb

## 2013-04-23 DIAGNOSIS — D649 Anemia, unspecified: Secondary | ICD-10-CM

## 2013-04-23 DIAGNOSIS — F17200 Nicotine dependence, unspecified, uncomplicated: Secondary | ICD-10-CM

## 2013-04-23 DIAGNOSIS — C321 Malignant neoplasm of supraglottis: Secondary | ICD-10-CM

## 2013-04-23 DIAGNOSIS — M270 Developmental disorders of jaws: Secondary | ICD-10-CM

## 2013-04-23 DIAGNOSIS — K08109 Complete loss of teeth, unspecified cause, unspecified class: Secondary | ICD-10-CM

## 2013-04-23 DIAGNOSIS — K08404 Partial loss of teeth, unspecified cause, class IV: Secondary | ICD-10-CM

## 2013-04-23 DIAGNOSIS — D4620 Refractory anemia with excess of blasts, unspecified: Secondary | ICD-10-CM

## 2013-04-23 DIAGNOSIS — Z0189 Encounter for other specified special examinations: Secondary | ICD-10-CM

## 2013-04-23 DIAGNOSIS — K08409 Partial loss of teeth, unspecified cause, unspecified class: Secondary | ICD-10-CM

## 2013-04-23 LAB — CBC & DIFF AND RETIC
Basophils Absolute: 0.1 10*3/uL (ref 0.0–0.1)
EOS%: 0.6 % (ref 0.0–7.0)
Eosinophils Absolute: 0 10*3/uL (ref 0.0–0.5)
Immature Retic Fract: 9.7 % (ref 3.00–10.60)
LYMPH%: 22.3 % (ref 14.0–49.0)
MCH: 36.8 pg — ABNORMAL HIGH (ref 27.2–33.4)
MCV: 111.4 fL — ABNORMAL HIGH (ref 79.3–98.0)
MONO%: 9.5 % (ref 0.0–14.0)
NEUT#: 4.6 10*3/uL (ref 1.5–6.5)
Platelets: 301 10*3/uL (ref 140–400)
RBC: 2.2 10*6/uL — ABNORMAL LOW (ref 4.20–5.82)
RDW: 23.6 % — ABNORMAL HIGH (ref 11.0–14.6)
Retic %: 1.17 % (ref 0.80–1.80)
WBC: 6.9 10*3/uL (ref 4.0–10.3)

## 2013-04-23 LAB — COMPREHENSIVE METABOLIC PANEL (CC13)
AST: 12 U/L (ref 5–34)
Albumin: 3.1 g/dL — ABNORMAL LOW (ref 3.5–5.0)
Anion Gap: 8 mEq/L (ref 3–11)
BUN: 17.6 mg/dL (ref 7.0–26.0)
Calcium: 9.1 mg/dL (ref 8.4–10.4)
Chloride: 107 mEq/L (ref 98–109)
Creatinine: 0.9 mg/dL (ref 0.7–1.3)
Glucose: 92 mg/dl (ref 70–140)
Potassium: 4.3 mEq/L (ref 3.5–5.1)
Total Protein: 8.6 g/dL — ABNORMAL HIGH (ref 6.4–8.3)

## 2013-04-23 LAB — IRON AND TIBC CHCC
TIBC: 139 ug/dL — ABNORMAL LOW (ref 202–409)
UIBC: 83 ug/dL — ABNORMAL LOW (ref 117–376)

## 2013-04-23 LAB — CHCC SMEAR

## 2013-04-23 LAB — FERRITIN CHCC: Ferritin: 516 ng/ml — ABNORMAL HIGH (ref 22–316)

## 2013-04-23 LAB — LACTATE DEHYDROGENASE (CC13): LDH: 158 U/L (ref 125–245)

## 2013-04-23 NOTE — Patient Instructions (Signed)
PLAN: 1. Continue oral salt water rinses every 2 hours while awake. 2. Brush tongue daily. 3. Maintain nutrition as best able with a liquid and soft mechanical diet. Followup with nutritionist for other suggestions for this type of diet. 4. Return to clinic for evaluation of healing and periodic oral examination during radiation therapy in approximately 2-3 weeks in early January of 2015. The patient is to call for this appointment once time of radiation therapy is known.  Charlynne Pander, DDS

## 2013-04-23 NOTE — Progress Notes (Signed)
POST OPERATIVE NOTE:  04/23/2013 Adrian [name] [id number]  VITALS: BP 115/76  Pulse 91  Temp(Src) 98.3 F (36.8 C) (Oral)  LABS:  Lab Results  Component Value Date   WBC 6.9 04/23/2013   HGB 8.1* 04/23/2013   HCT 24.5* 04/23/2013   MCV 111.4* 04/23/2013   PLT 301 04/23/2013   BMET    Component Value Date/Time   NA 138 04/23/2013 1221   NA 137 04/15/2013 1346   K 4.3 04/23/2013 1221   K 4.4 04/15/2013 1346   CL 106 04/15/2013 1346   CL 107 09/19/2012 1325   CO2 23 04/23/2013 1221   CO2 21 04/15/2013 1346   GLUCOSE 92 04/23/2013 1221   GLUCOSE 89 04/15/2013 1346   GLUCOSE 97 09/19/2012 1325   BUN 17.6 04/23/2013 1221   BUN 19 04/15/2013 1346   CREATININE 0.9 04/23/2013 1221   CREATININE 0.80 04/15/2013 1346   CALCIUM 9.1 04/23/2013 1221   CALCIUM 8.8 04/15/2013 1346   GFRNONAA >90 04/15/2013 1346   GFRAA >90 04/15/2013 1346    Lab Results  Component Value Date   INR 1.19 04/15/2013   INR 1.37 02/03/2013   INR 1.67* 01/23/2013   No results found for this basename: PTT     NIKOLAJ GERAGHTY is status post extraction of remaining teeth with alveoloplasty and pre-prosthetic surgery as indicated in the operating room with general anesthesia on 04/16/2013.  I explained the extensive pre-prosthetic surgery that we performed in the operating room due to to the hyperplastic tissues and lateral exostoses noted during the operating room procedure.   SUBJECTIVE: Patient denies having any significant discomfort. Patient indicates that he has multiple stitches that remain. Patient is using salt water rinses as instructed.  EXAM: There is no sign of infection, heme, or ooze. Sutures are loosely intact. Generalized primary closure is noted. The patient has excellent surgical results after significant pre-prosthetic surgery.  PROCEDURE: The patient was given a chlorhexidine gluconate rinse for 30 seconds. Sutures were then removed without complication. Patient tolerated the  procedure well.  ASSESSMENT: Post operative course is consistent with dental procedures performed in the operating room. The patient is now edentulous.  PLAN: 1. Continue oral salt water rinses every 2 hours while awake. 2. Brush tongue daily. 3. Maintain nutrition as best able with a liquid and soft mechanical diet. Followup with nutritionist for other suggestions for this type of diet. 4. Return to clinic for evaluation of healing and periodic oral examination during radiation therapy in approximately 2-3 weeks in early January of 2015. The patient is to call for this appointment once time of radiation therapy is known.  Charlynne Pander, DDS

## 2013-04-23 NOTE — Telephone Encounter (Signed)
appts made per 12/16 POF AVS and CAL printed shh °

## 2013-04-23 NOTE — Progress Notes (Signed)
[facility] OFFICE PROGRESS NOTE  Patient Care Team: Blair Heys, MD as PCP - General (Family Medicine) Artis Delay, MD as Consulting Physician (Hematology and Oncology)  DIAGNOSIS: Chronic megaloblastic anemia  SUMMARY OF ONCOLOGIC HISTORY: This is a patient with chronic megaloblastic anemia, had multiple bone marrow aspirate and biopsy which rule out myelodysplastic syndrome. He had been getting erythropoietin stimulating agents and B12 replacement therapy from the anemia further followup. In September 2014, the patient had severe pneumonia and was hospitalized. His anemia got worse and he received blood transfusion  INTERVAL HISTORY: Adrian [name] 62 y.o. male returns for further followup. He is also diagnosed with epiglottic cancer recently and currently undergoing evaluation. The patient continued to smoke half a pack of cigarettes per day The patient denies any recent signs or symptoms of bleeding such as spontaneous epistaxis, hematuria or hematochezia. He denies any symptoms of anemia such as headache, chest pain or shortness of breath on exertion  I have reviewed the past medical history, past surgical history, social history and family history with the patient and they are unchanged from previous note.  ALLERGIES:  has No Known Allergies.  MEDICATIONS:  Current Outpatient Prescriptions  Medication Sig Dispense Refill  . cyanocobalamin 500 MCG tablet Take 500 mcg by mouth daily.      . Darbepoetin Alfa-Polysorbate (ARANESP, ALB FREE, SURECLICK IJ) Inject 0.2 mLs as directed every 21 ( twenty-one) days. Aranesp 500 MCG/ML SOLN   Active      . diazepam (VALIUM) 5 MG tablet Take 5 mg by mouth every 6 (six) hours as needed for anxiety.      . Fe Fum-Vit C-Vit B12-FA (HEMATOGEN FORTE PO) Take 1 capsule by mouth daily.      . folic acid (FOLVITE) 1 MG tablet Place 1 tablet (1 mg total) into feeding tube daily.      . mometasone (NASONEX) 50 MCG/ACT nasal spray  Place 2 sprays into both nostrils daily.      . phenytoin (DILANTIN) 100 MG ER capsule Take 200 mg by mouth 2 (two) times daily.      . vitamin E 1000 UNIT capsule Take 500 Units by mouth daily.      Marland Kitchen nystatin (MYCOSTATIN) 100000 UNIT/ML suspension Use as directed 5 mLs in the mouth or throat 4 (four) times daily.      Marland Kitchen oxyCODONE-acetaminophen (PERCOCET) 5-325 MG per tablet Take one or two tablets by mouth every 4-6 hours as needed for pain.  40 tablet  0   No current facility-administered medications for this visit.    REVIEW OF SYSTEMS:   Constitutional: Denies fevers, chills or abnormal weight loss Eyes: Denies blurriness of vision Ears, nose, mouth, throat, and face: Denies mucositis or sore throat Respiratory: Denies cough, dyspnea or wheezes Cardiovascular: Denies palpitation, chest discomfort or lower extremity swelling Gastrointestinal:  Denies nausea, heartburn or change in bowel habits Skin: Denies abnormal skin rashes Lymphatics: Denies new lymphadenopathy or easy bruising Neurological:Denies numbness, tingling or new weaknesses Behavioral/Psych: Mood is stable, no new changes  All other systems were reviewed with the patient and are negative.  PHYSICAL EXAMINATION: ECOG PERFORMANCE STATUS: 1 - Symptomatic but completely ambulatory  Filed Vitals:   04/23/13 1239  BP: 113/66  Pulse: 100  Temp: 98.6 F (37 C)  Resp: 18   Filed Weights   04/23/13 1239  Weight: 117 lb 8 oz (53.298 kg)    GENERAL:alert, no distress and comfortable. He looks extremely thin and cachectic Musculoskeletal:no cyanosis  of digits and no clubbing  NEURO: alert & oriented x 3 with fluent speech, no focal motor/sensory deficits  LABORATORY DATA:  I have reviewed the data as listed    Component Value Date/Time   NA 138 04/23/2013 1221   NA 137 04/15/2013 1346   K 4.3 04/23/2013 1221   K 4.4 04/15/2013 1346   CL 106 04/15/2013 1346   CL 107 09/19/2012 1325   CO2 23 04/23/2013 1221   CO2  21 04/15/2013 1346   GLUCOSE 92 04/23/2013 1221   GLUCOSE 89 04/15/2013 1346   GLUCOSE 97 09/19/2012 1325   BUN 17.6 04/23/2013 1221   BUN 19 04/15/2013 1346   CREATININE 0.9 04/23/2013 1221   CREATININE 0.80 04/15/2013 1346   CALCIUM 9.1 04/23/2013 1221   CALCIUM 8.8 04/15/2013 1346   PROT 8.6* 04/23/2013 1221   PROT 8.2 02/11/2013 0415   ALBUMIN 3.1* 04/23/2013 1221   ALBUMIN 1.8* 02/11/2013 0415   AST 12 04/23/2013 1221   AST 21 02/11/2013 0415   ALT <6 04/23/2013 1221   ALT 20 02/11/2013 0415   ALKPHOS 138 04/23/2013 1221   ALKPHOS 76 02/11/2013 0415   BILITOT 0.29 04/23/2013 1221   BILITOT 0.3 02/11/2013 0415   GFRNONAA >90 04/15/2013 1346   GFRAA >90 04/15/2013 1346    No results found for this basename: SPEP,  UPEP,   kappa and lambda light chains    Lab Results  Component Value Date   WBC 6.9 04/23/2013   NEUTROABS 4.6 04/23/2013   HGB 8.1* 04/23/2013   HCT 24.5* 04/23/2013   MCV 111.4* 04/23/2013   PLT 301 04/23/2013      Chemistry      Component Value Date/Time   NA 138 04/23/2013 1221   NA 137 04/15/2013 1346   K 4.3 04/23/2013 1221   K 4.4 04/15/2013 1346   CL 106 04/15/2013 1346   CL 107 09/19/2012 1325   CO2 23 04/23/2013 1221   CO2 21 04/15/2013 1346   BUN 17.6 04/23/2013 1221   BUN 19 04/15/2013 1346   CREATININE 0.9 04/23/2013 1221   CREATININE 0.80 04/15/2013 1346      Component Value Date/Time   CALCIUM 9.1 04/23/2013 1221   CALCIUM 8.8 04/15/2013 1346   ALKPHOS 138 04/23/2013 1221   ALKPHOS 76 02/11/2013 0415   AST 12 04/23/2013 1221   AST 21 02/11/2013 0415   ALT <6 04/23/2013 1221   ALT 20 02/11/2013 0415   BILITOT 0.29 04/23/2013 1221   BILITOT 0.3 02/11/2013 0415       ASSESSMENT & PLAN:  #1 severe anemia The patient had no symptoms of anemia. He denies any recent bleeding. His most recent B12 level showed that he has adequate replacement. His prior bone marrow aspirate and biopsy in 2011 did not show any evidence of myelodysplastic  syndrome. Due to recent diagnosis of cancer, he could certainly cause anemia chronic disease picture. I recommend discontinue erythropoietin stimulating agents due to adverse reaction of promoting cancer growth. I recommend to see him back next week to followup on his blood count carefully and transfuse as needed. He is asymptomatic from the anemia. We will observe for now.  He does not require transfusion now.  #2 recent pneumonia He is improving. I recommend the patient to stop smoking. #3 smoking The patient has severe pneumonia requiring ventilatory support recently. He is not interested to quit right now. Smoking cessation advice has been provided. #4 epiglottic cancer The patient's case will  be presented at ENT tumor board tomorrow for further treatment. CT scan has been ordered for Thursday.  Orders Placed This Encounter  Procedures  . CBC & Diff and Retic    Standing Status: Future     Number of Occurrences:      Standing Expiration Date: 04/23/2014  . Hold Tube, Blood Bank    Standing Status: Future     Number of Occurrences:      Standing Expiration Date: 04/23/2014   All questions were answered. The patient knows to call the clinic with any problems, questions or concerns. No barriers to learning was detected.    [facility], Karlina Suares, MD 04/23/2013 3:06 PM

## 2013-04-24 LAB — DIRECT ANTIGLOBULIN TEST (NOT AT ARMC)
DAT (Complement): NEGATIVE
DAT IgG: NEGATIVE

## 2013-04-25 ENCOUNTER — Inpatient Hospital Stay: Admission: RE | Admit: 2013-04-25 | Payer: BC Managed Care – PPO | Source: Ambulatory Visit

## 2013-04-25 ENCOUNTER — Other Ambulatory Visit: Payer: Self-pay | Admitting: Otolaryngology

## 2013-04-25 ENCOUNTER — Ambulatory Visit
Admission: RE | Admit: 2013-04-25 | Discharge: 2013-04-25 | Disposition: A | Discharge status: Home / Self Care | Payer: BC Managed Care – PPO | Source: Ambulatory Visit | Attending: Otolaryngology | Admitting: Otolaryngology

## 2013-04-25 DIAGNOSIS — C329 Malignant neoplasm of larynx, unspecified: Secondary | ICD-10-CM

## 2013-04-25 MED ORDER — IOHEXOL 300 MG/ML  SOLN
75.0000 mL | Freq: Once | INTRAMUSCULAR | Status: AC | PRN
  Administered 2013-04-25: 75 mL via INTRAVENOUS

## 2013-04-29 ENCOUNTER — Telehealth: Payer: Self-pay | Admitting: *Deleted

## 2013-04-29 NOTE — Progress Notes (Signed)
[employer] application completed and faxed to [employer].Approved by Melida Quitter.Will be able to utilize services asap.Must notify reservations 1 to 7 days in advance by calling [phone].Annice Pih will call patient to complete interview via phone.

## 2013-04-29 NOTE — Telephone Encounter (Signed)
Called pt at home to introduce myself as the nurse navigator that works with Drs. Bertis Ruddy and Kindard.   I indicated and that I would be joining him during his appt with Dr. Bertis Ruddy tomorrow and would tell him  more about my role as a member of the Care Team when we meet.   Beginning to navigate as L1 (new) patient.  Young Berry, RN, BSN, [facility] Navigator [phone]

## 2013-04-30 ENCOUNTER — Ambulatory Visit (HOSPITAL_COMMUNITY)
Admission: RE | Admit: 2013-04-30 | Discharge: 2013-04-30 | Disposition: A | Discharge status: Home / Self Care | Payer: BC Managed Care – PPO | Source: Ambulatory Visit | Attending: Hematology and Oncology | Admitting: Hematology and Oncology

## 2013-04-30 ENCOUNTER — Ambulatory Visit
Admission: RE | Admit: 2013-04-30 | Discharge: 2013-04-30 | Disposition: A | Discharge status: Home / Self Care | Payer: BC Managed Care – PPO | Source: Ambulatory Visit | Attending: Radiation Oncology | Admitting: Radiation Oncology

## 2013-04-30 ENCOUNTER — Telehealth: Payer: Self-pay | Admitting: *Deleted

## 2013-04-30 ENCOUNTER — Other Ambulatory Visit (HOSPITAL_BASED_OUTPATIENT_CLINIC_OR_DEPARTMENT_OTHER): Payer: BC Managed Care – PPO

## 2013-04-30 ENCOUNTER — Ambulatory Visit: Payer: BC Managed Care – PPO

## 2013-04-30 ENCOUNTER — Telehealth: Payer: Self-pay | Admitting: Hematology and Oncology

## 2013-04-30 ENCOUNTER — Other Ambulatory Visit: Payer: Self-pay | Admitting: Hematology and Oncology

## 2013-04-30 ENCOUNTER — Encounter: Payer: Self-pay | Admitting: *Deleted

## 2013-04-30 ENCOUNTER — Ambulatory Visit (HOSPITAL_BASED_OUTPATIENT_CLINIC_OR_DEPARTMENT_OTHER): Payer: BC Managed Care – PPO | Admitting: Hematology and Oncology

## 2013-04-30 VITALS — BP 118/67 | HR 106 | Temp 98.2°F | Resp 18 | Ht 72.0 in | Wt 117.1 lb

## 2013-04-30 VITALS — BP 131/67 | HR 75 | Temp 97.6°F | Resp 18

## 2013-04-30 DIAGNOSIS — C321 Malignant neoplasm of supraglottis: Secondary | ICD-10-CM

## 2013-04-30 DIAGNOSIS — J029 Acute pharyngitis, unspecified: Secondary | ICD-10-CM | POA: Insufficient documentation

## 2013-04-30 DIAGNOSIS — D4620 Refractory anemia with excess of blasts, unspecified: Secondary | ICD-10-CM | POA: Insufficient documentation

## 2013-04-30 DIAGNOSIS — L819 Disorder of pigmentation, unspecified: Secondary | ICD-10-CM | POA: Insufficient documentation

## 2013-04-30 DIAGNOSIS — Z510 Encounter for antineoplastic radiation therapy: Secondary | ICD-10-CM | POA: Insufficient documentation

## 2013-04-30 DIAGNOSIS — D649 Anemia, unspecified: Secondary | ICD-10-CM

## 2013-04-30 DIAGNOSIS — B370 Candidal stomatitis: Secondary | ICD-10-CM | POA: Insufficient documentation

## 2013-04-30 DIAGNOSIS — Z79899 Other long term (current) drug therapy: Secondary | ICD-10-CM | POA: Insufficient documentation

## 2013-04-30 DIAGNOSIS — F17200 Nicotine dependence, unspecified, uncomplicated: Secondary | ICD-10-CM

## 2013-04-30 DIAGNOSIS — I951 Orthostatic hypotension: Secondary | ICD-10-CM | POA: Insufficient documentation

## 2013-04-30 DIAGNOSIS — L539 Erythematous condition, unspecified: Secondary | ICD-10-CM | POA: Insufficient documentation

## 2013-04-30 LAB — CBC & DIFF AND RETIC
BASO%: 0.6 % (ref 0.0–2.0)
EOS%: 1.1 % (ref 0.0–7.0)
Eosinophils Absolute: 0.1 10*3/uL (ref 0.0–0.5)
HCT: 24.1 % — ABNORMAL LOW (ref 38.4–49.9)
LYMPH%: 17.8 % (ref 14.0–49.0)
MCH: 37.4 pg — ABNORMAL HIGH (ref 27.2–33.4)
MCHC: 32.8 g/dL (ref 32.0–36.0)
MCV: 114.2 fL — ABNORMAL HIGH (ref 79.3–98.0)
MONO%: 9.4 % (ref 0.0–14.0)
NEUT#: 3.3 10*3/uL (ref 1.5–6.5)
Platelets: 312 10*3/uL (ref 140–400)
RBC: 2.11 10*6/uL — ABNORMAL LOW (ref 4.20–5.82)
RDW: 22.4 % — ABNORMAL HIGH (ref 11.0–14.6)
Retic %: 1.28 % (ref 0.80–1.80)

## 2013-04-30 LAB — PREPARE RBC (CROSSMATCH)

## 2013-04-30 MED ORDER — SODIUM CHLORIDE 0.9 % IV SOLN
250.0000 mL | Freq: Once | INTRAVENOUS | Status: AC
  Administered 2013-04-30: 250 mL via INTRAVENOUS

## 2013-04-30 MED ORDER — SODIUM CHLORIDE 0.9 % IJ SOLN
10.0000 mL | Freq: Once | INTRAMUSCULAR | Status: AC
  Administered 2013-04-30: 10 mL via INTRAVENOUS

## 2013-04-30 NOTE — Progress Notes (Signed)
Patient here for IV start.  Attempted IV start x 2 by Renaldo Reel, RN.  Attempted IV start by Sonda Rumble, RN x 1.  Insusion nurse called to see if they can come to start the IV.

## 2013-04-30 NOTE — Telephone Encounter (Signed)
Per staff message and POF I have scheduled appts.  JMW  

## 2013-04-30 NOTE — Progress Notes (Signed)
Met with patient and his wife prior to and during scheduled appt with Dr. Bertis Ruddy, during [facility], and during blood transfusion.  Explained my role as Statistician, encouraged them to call me with questions or concerns throughout patient's treatments.  Provided education re: RT.  They verbalized understanding.   Young Berry, RN, BSN, [facility] Navigator [phone]

## 2013-04-30 NOTE — Telephone Encounter (Signed)
appts made per 12/23 POF Email to MW to add Tx AVS and CAL given shh

## 2013-04-30 NOTE — Patient Instructions (Addendum)
Blood Transfusion Information WHAT IS A BLOOD TRANSFUSION? A transfusion is the replacement of blood or some of its parts. Blood is made up of multiple cells which provide different functions.  Red blood cells carry oxygen and are used for blood loss replacement.  White blood cells fight against infection.  Platelets control bleeding.  Plasma helps clot blood.  Other blood products are available for specialized needs, such as hemophilia or other clotting disorders. BEFORE THE TRANSFUSION  Who gives blood for transfusions?   You may be able to donate blood to be used at a later date on yourself (autologous donation).  Relatives can be asked to donate blood. This is generally not any safer than if you have received blood from a stranger. The same precautions are taken to ensure safety when a relative's blood is donated.  Healthy volunteers who are fully evaluated to make sure their blood is safe. This is blood bank blood. Transfusion therapy is the safest it has ever been in the practice of medicine. Before blood is taken from a donor, a complete history is taken to make sure that person has no history of diseases nor engages in risky social behavior (examples are intravenous drug use or sexual activity with multiple partners). The donor's travel history is screened to minimize risk of transmitting infections, such as malaria. The donated blood is tested for signs of infectious diseases, such as HIV and hepatitis. The blood is then tested to be sure it is compatible with you in order to minimize the chance of a transfusion reaction. If you or a relative donates blood, this is often done in anticipation of surgery and is not appropriate for emergency situations. It takes many days to process the donated blood. RISKS AND COMPLICATIONS Although transfusion therapy is very safe and saves many lives, the main dangers of transfusion include:   Getting an infectious disease.  Developing a  transfusion reaction. This is an allergic reaction to something in the blood you were given. Every precaution is taken to prevent this. The decision to have a blood transfusion has been considered carefully by your caregiver before blood is given. Blood is not given unless the benefits outweigh the risks. AFTER THE TRANSFUSION  Right after receiving a blood transfusion, you will usually feel much better and more energetic. This is especially true if your red blood cells have gotten low (anemic). The transfusion raises the level of the red blood cells which carry oxygen, and this usually causes an energy increase.  The nurse administering the transfusion will monitor you carefully for complications. HOME CARE INSTRUCTIONS  No special instructions are needed after a transfusion. You may find your energy is better. Speak with your caregiver about any limitations on activity for underlying diseases you may have. SEEK MEDICAL CARE IF:   Your condition is not improving after your transfusion.  You develop redness or irritation at the intravenous (IV) site. SEEK IMMEDIATE MEDICAL CARE IF:  Any of the following symptoms occur over the next 12 hours:  Shaking chills.  You have a temperature by mouth above 102 F (38.9 C), not controlled by medicine.  Chest, back, or muscle pain.  People around you feel you are not acting correctly or are confused.  Shortness of breath or difficulty breathing.  Dizziness and fainting.  You get a rash or develop hives.  You have a decrease in urine output.  Your urine turns a dark color or changes to pink, red, or brown. Any of the following   symptoms occur over the next 10 days:  You have a temperature by mouth above 102 F (38.9 C), not controlled by medicine.  Shortness of breath.  Weakness after normal activity.  The white part of the eye turns yellow (jaundice).  You have a decrease in the amount of urine or are urinating less often.  Your  urine turns a dark color or changes to pink, red, or brown. Document Released: 04/22/2000 Document Revised: 07/18/2011 Document Reviewed: 12/10/2007 Exit[employer] Patient Information 2014 [employer].  

## 2013-04-30 NOTE — Progress Notes (Signed)
[facility] OFFICE PROGRESS NOTE  Patient Care Team: Blair Heys, MD as PCP - General (Family Medicine) Artis Delay, MD as Consulting Physician (Hematology and Oncology) Dennis Bast, RN as Registered Nurse (Oncology)  DIAGNOSIS: History of low-grade myelodysplastic syndrome with chronic megaloblastic anemia and recent diagnosis of cancer of the epiglottis  SUMMARY OF ONCOLOGIC HISTORY: Oncology History   MDS, R-IPSS score of 3, low risk (Hg 8.9, +16 chromosome on BM, 2% blast count)   Squamous cell carcinoma of the epiglottis   Primary site: Larynx - Supraglottis (Left)   Staging method: AJCC 7th Edition   Clinical: Stage I (T1, N0, M0) signed by Artis Delay, MD on 04/30/2013 12:49 PM   Pathologic: Stage I (T1, N0, cM0) signed by Artis Delay, MD on 04/30/2013 12:49 PM   Summary: Stage I (T1, N0, cM0)       MDS (myelodysplastic syndrome), low grade   02/01/2007 Bone Marrow Biopsy BM biopsy was abnormal, overall probable low grade MDS   03/23/2011 - 02/06/2013 Chemotherapy [name], discontinued due to diagnosis of laryngeal ca    Squamous cell carcinoma of the epiglottis   03/06/2013 Procedure Biopsy from epiglottic region showed invasive [facility]   04/25/2013 Imaging Ct scan showed no other involvement in the LN    INTERVAL HISTORY: Adrian [name] 62 y.o. male returns for further followup. His currently undergoing evaluation and treatment for squamous cell carcinoma of the epiglottis. The patient continued to smoke cigarettes and unable to quit. The patient denies any recent signs or symptoms of bleeding such as spontaneous epistaxis, hematuria or hematochezia.  I have reviewed the past medical history, past surgical history, social history and family history with the patient and they are unchanged from previous note.  ALLERGIES:  has No Known Allergies.  MEDICATIONS:  Current Outpatient Prescriptions  Medication Sig Dispense Refill  .  cyanocobalamin 500 MCG tablet Take 500 mcg by mouth daily.      . Darbepoetin Alfa-Polysorbate (ARANESP, ALB FREE, SURECLICK IJ) Inject 0.2 mLs as directed every 21 ( twenty-one) days. Aranesp 500 MCG/ML SOLN   Active      . diazepam (VALIUM) 5 MG tablet Take 5 mg by mouth every 6 (six) hours as needed for anxiety.      . Fe Fum-Vit C-Vit B12-FA (HEMATOGEN FORTE PO) Take 1 capsule by mouth daily.      . folic acid (FOLVITE) 1 MG tablet Place 1 tablet (1 mg total) into feeding tube daily.      . mometasone (NASONEX) 50 MCG/ACT nasal spray Place 2 sprays into both nostrils daily.      Marland Kitchen nystatin (MYCOSTATIN) 100000 UNIT/ML suspension Use as directed 5 mLs in the mouth or throat 4 (four) times daily.      Marland Kitchen oxyCODONE-acetaminophen (PERCOCET) 5-325 MG per tablet Take one or two tablets by mouth every 4-6 hours as needed for pain.  40 tablet  0  . phenytoin (DILANTIN) 100 MG ER capsule Take 200 mg by mouth 2 (two) times daily.      . vitamin E 1000 UNIT capsule Take 500 Units by mouth daily.       No current facility-administered medications for this visit.    REVIEW OF SYSTEMS:   Constitutional: Denies fevers, chills or abnormal weight loss Behavioral/Psych: Mood is stable, no new changes  All other systems were reviewed with the patient and are negative.  PHYSICAL EXAMINATION: ECOG PERFORMANCE STATUS: 1 - Symptomatic but completely ambulatory  Filed Vitals:  04/30/13 1137  BP: 118/67  Pulse: 106  Temp: 98.2 F (36.8 C)  Resp: 18   Filed Weights   04/30/13 1137  Weight: 117 lb 1.6 oz (53.116 kg)    GENERAL:alert, no distress and comfortable SKIN: skin color, texture, turgor are normal, no rashes or significant lesions Musculoskeletal:no cyanosis of digits and no clubbing  NEURO: alert & oriented x 3 with fluent speech, no focal motor/sensory deficits  LABORATORY DATA:  I have reviewed the data as listed    Component Value Date/Time   NA 138 04/23/2013 1221   NA 137 04/15/2013  1346   K 4.3 04/23/2013 1221   K 4.4 04/15/2013 1346   CL 106 04/15/2013 1346   CL 107 09/19/2012 1325   CO2 23 04/23/2013 1221   CO2 21 04/15/2013 1346   GLUCOSE 92 04/23/2013 1221   GLUCOSE 89 04/15/2013 1346   GLUCOSE 97 09/19/2012 1325   BUN 17.6 04/23/2013 1221   BUN 19 04/15/2013 1346   CREATININE 0.9 04/23/2013 1221   CREATININE 0.80 04/15/2013 1346   CALCIUM 9.1 04/23/2013 1221   CALCIUM 8.8 04/15/2013 1346   PROT 8.6* 04/23/2013 1221   PROT 8.2 02/11/2013 0415   ALBUMIN 3.1* 04/23/2013 1221   ALBUMIN 1.8* 02/11/2013 0415   AST 12 04/23/2013 1221   AST 21 02/11/2013 0415   ALT <6 04/23/2013 1221   ALT 20 02/11/2013 0415   ALKPHOS 138 04/23/2013 1221   ALKPHOS 76 02/11/2013 0415   BILITOT 0.29 04/23/2013 1221   BILITOT 0.3 02/11/2013 0415   GFRNONAA >90 04/15/2013 1346   GFRAA >90 04/15/2013 1346    No results found for this basename: SPEP, UPEP,  kappa and lambda light chains    Lab Results  Component Value Date   WBC 4.7 04/30/2013   NEUTROABS 3.3 04/30/2013   HGB 7.9* 04/30/2013   HCT 24.1* 04/30/2013   MCV 114.2* 04/30/2013   PLT 312 04/30/2013      Chemistry      Component Value Date/Time   NA 138 04/23/2013 1221   NA 137 04/15/2013 1346   K 4.3 04/23/2013 1221   K 4.4 04/15/2013 1346   CL 106 04/15/2013 1346   CL 107 09/19/2012 1325   CO2 23 04/23/2013 1221   CO2 21 04/15/2013 1346   BUN 17.6 04/23/2013 1221   BUN 19 04/15/2013 1346   CREATININE 0.9 04/23/2013 1221   CREATININE 0.80 04/15/2013 1346      Component Value Date/Time   CALCIUM 9.1 04/23/2013 1221   CALCIUM 8.8 04/15/2013 1346   ALKPHOS 138 04/23/2013 1221   ALKPHOS 76 02/11/2013 0415   AST 12 04/23/2013 1221   AST 21 02/11/2013 0415   ALT <6 04/23/2013 1221   ALT 20 02/11/2013 0415   BILITOT 0.29 04/23/2013 1221   BILITOT 0.3 02/11/2013 0415       RADIOGRAPHIC STUDIES: A review imaging study of the CT scan we show no evidence of distant metastatic disease I have personally reviewed the  radiological images as listed and agreed with the findings in the report.  ASSESSMENT & PLAN:  #1 low-grade myelodysplastic syndrome The patient was receiving erythropoietin stimulating agents up until recently. Due to recent diagnosis of head and neck cancer, I would discontinue erythropoietin stimulating agents due to risk of promoting cancer growth. I have discussed this with the radiation oncologist. I will attempt to give the patient intermittent blood transfusion to keep his hemoglobin close to the 9 range to improve  outcome with radiation therapy for epiglottic cancer. We discussed some of the risks, benefits, and alternatives of blood transfusions. The patient is symptomatic from anemia and the hemoglobin level is critically low.  Some of the side-effects to be expected including risks of transfusion reactions, chills, infection, syndrome of volume overload and risk of hospitalization from various reasons and the patient is willing to proceed and went ahead to sign consent today. I will give him 1 unit of blood today. #2 smoking The patient is encouraged to quit smoking. He is not interested to quit #3 epiglottic cancer T1, N0, M0 The patient will begin radiation therapy in the near future. We'll continue to provide supportive care.  Orders Placed This Encounter  Procedures  . CBC & Diff and Retic    Standing Status: Future     Number of Occurrences:      Standing Expiration Date: 04/30/2014  . CBC with Differential    Standing Status: Future     Number of Occurrences:      Standing Expiration Date: 01/20/2014  . Comprehensive metabolic panel    Standing Status: Future     Number of Occurrences:      Standing Expiration Date: 04/30/2014  . Hold Tube, Blood Bank    Standing Status: Future     Number of Occurrences:      Standing Expiration Date: 04/30/2014   All questions were answered. The patient knows to call the clinic with any problems, questions or concerns. No barriers  to learning was detected. [facility], Caylan Chenard, MD 04/30/2013 12:51 PM

## 2013-05-01 LAB — TYPE AND SCREEN

## 2013-05-03 NOTE — Progress Notes (Signed)
  Radiation Oncology         [phone]) [phone] ________________________________  Name: Adrian [name] MRN: [id number]  Date: 04/30/2013  DOB: 1950/08/10  SIMULATION AND TREATMENT PLANNING NOTE  DIAGNOSIS: Squamous cell carcinoma of the epiglottis  Primary site: Larynx - Supraglottis (Left), Clinical: Stage I (T1, N0, M0)   NARRATIVE:  The patient was brought to the CT Simulation planning suite.  Identity was confirmed.  All relevant records and images related to the planned course of therapy were reviewed.  The patient freely provided informed written consent to proceed with treatment after reviewing the details related to the planned course of therapy. The consent form was witnessed and verified by the simulation staff.  Then, the patient was set-up in a stable reproducible  supine position for radiation therapy.  CT images were obtained.  Surface markings were placed.  The CT images were loaded into the planning software.  Then the target and avoidance structures were contoured.  Treatment planning then occurred.  The radiation prescription was entered and confirmed.  Then, I designed and supervised the construction of a total of 1 medically necessary complex treatment devices.  I have requested : Intensity Modulated Radiotherapy (IMRT) is medically necessary for this case for the following reason:  Parotid sparing..  I have ordered:dose calc.  PLAN:  The patient will receive 70 Gy in 35 fractions.  ________________________________  -----------------------------------  Billie Lade, PhD, MD

## 2013-05-08 ENCOUNTER — Other Ambulatory Visit: Payer: BC Managed Care – PPO | Admitting: Lab

## 2013-05-08 ENCOUNTER — Ambulatory Visit: Payer: BC Managed Care – PPO

## 2013-05-10 ENCOUNTER — Ambulatory Visit (HOSPITAL_COMMUNITY)
Admission: RE | Admit: 2013-05-10 | Discharge: 2013-05-10 | Disposition: A | Discharge status: Home / Self Care | Payer: BC Managed Care – PPO | Source: Ambulatory Visit | Attending: Hematology and Oncology | Admitting: Hematology and Oncology

## 2013-05-13 ENCOUNTER — Encounter: Payer: Self-pay | Admitting: *Deleted

## 2013-05-13 ENCOUNTER — Ambulatory Visit
Admission: RE | Admit: 2013-05-13 | Discharge: 2013-05-13 | Disposition: A | Discharge status: Home / Self Care | Payer: BC Managed Care – PPO | Source: Ambulatory Visit | Attending: Radiation Oncology | Admitting: Radiation Oncology

## 2013-05-13 DIAGNOSIS — C321 Malignant neoplasm of supraglottis: Secondary | ICD-10-CM

## 2013-05-13 NOTE — Progress Notes (Signed)
   Department of Radiation Oncology  Phone:  [phone] Fax:        [phone]   IMRT Device Note:  Today the patient had development of his IM RT device. Patient will be treated with 13.7 sinogram segments. This constitutes 1 IM RT device.  -----------------------------------  Blair Promise, PhD, MD

## 2013-05-14 ENCOUNTER — Ambulatory Visit
Admission: RE | Admit: 2013-05-14 | Discharge: 2013-05-14 | Disposition: A | Discharge status: Home / Self Care | Payer: BC Managed Care – PPO | Source: Ambulatory Visit | Attending: Radiation Oncology | Admitting: Radiation Oncology

## 2013-05-14 ENCOUNTER — Encounter: Payer: Self-pay | Admitting: Radiation Oncology

## 2013-05-14 VITALS — BP 136/84 | HR 96 | Temp 97.7°F | Resp 20 | Wt 118.8 lb

## 2013-05-14 DIAGNOSIS — C321 Malignant neoplasm of supraglottis: Secondary | ICD-10-CM

## 2013-05-14 NOTE — Progress Notes (Signed)
Met with patient and his wife during scheduled IMRT [place] to provide support and care continuity.  Reviewed procedure for arriving/preparing for tmts; patient verbalized understanding.  Showed patient how to curl and secure PEG with tape.  Encouraged patient to call me with any questions/concerns.  He indicated understanding.  Continuing to navigate as L1 (new) patient.  Gayleen Orem, RN, BSN, [facility] Head [facility] Neck Oncology Navigator [phone]

## 2013-05-14 NOTE — Progress Notes (Signed)
Weekly rad txs epiglottis 2end treatment, gave biafine cream, rad book, along with soft toothbrush, patient to read folded pages when he goes home,will discuss tomorrow after tx , no c/o pain, needs Dietician consult with Pamala Hurry neff, will make referral, no pain, has peg tube,changed dressing, greenish exudate on gause,  gave drain split dressings , wash area soap and water daily 5:53 PM

## 2013-05-14 NOTE — Progress Notes (Signed)
[facility]     Rexene Edison, M.D. [place], [place] [address]               Blair Promise, M.D., Ph.D. Phone: [phone]      [place] A. Tammi Klippel, M.D. Fax: [phone]      Jodelle Gross, M.D., Ph.D.         Thea Silversmith, M.D.         Wyvonnia Lora, M.D Weekly Treatment Management Note  Name: Adrian [name]     MRN: [id number]        CSN: [id number] Date: 05/14/2013      DOB: 03/20/1951  CC: [name], MD         [name]    Status: Outpatient  Diagnosis: The encounter diagnosis was Squamous cell carcinoma of the epiglottis.  Current Dose: 4 Gy  Current Fraction: 2  Planned Dose: 70 Gy  Narrative: Adrian [name] was seen today for weekly treatment management. The chart was checked and MVCT  were reviewed. He is tolerating treatments well at this time without any side effects  Review of patient's allergies indicates no known allergies. Current Outpatient Prescriptions  Medication Sig Dispense Refill  . cyanocobalamin 500 MCG tablet Take 500 mcg by mouth daily.      . Darbepoetin Alfa-Polysorbate (ARANESP, ALB FREE, SURECLICK IJ) Inject 0.2 mLs as directed every 21 ( twenty-one) days. Aranesp 500 MCG/ML SOLN   Active      . diazepam (VALIUM) 5 MG tablet Take 5 mg by mouth every 6 (six) hours as needed for anxiety.      Derrill Memo ON 05/15/2013] emollient (BIAFINE) cream Apply 1 application topically daily. Apply to affected skin area on neck after rad txs when skin becomes irritated ot itching,dryness daily      . Fe Fum-Vit C-Vit B12-FA (HEMATOGEN FORTE PO) Take 1 capsule by mouth daily.      . folic acid (FOLVITE) 1 MG tablet Place 1 tablet (1 mg total) into feeding tube daily.      . mometasone (NASONEX) 50 MCG/ACT nasal spray Place 2 sprays into both nostrils daily.      Marland Kitchen nystatin (MYCOSTATIN) 100000 UNIT/ML suspension Use as directed 5 mLs in the mouth or throat 4 (four) times daily.      Marland Kitchen  oxyCODONE-acetaminophen (PERCOCET) 5-325 MG per tablet Take one or two tablets by mouth every 4-6 hours as needed for pain.  40 tablet  0  . phenytoin (DILANTIN) 100 MG ER capsule Take 200 mg by mouth 2 (two) times daily.      . vitamin E 1000 UNIT capsule Take 500 Units by mouth daily.       No current facility-administered medications for this encounter.   Labs:  Lab Results  Component Value Date   WBC 4.7 04/30/2013   HGB 7.9* 04/30/2013   HCT 24.1* 04/30/2013   MCV 114.2* 04/30/2013   PLT 312 04/30/2013   Lab Results  Component Value Date   CREATININE 0.9 04/23/2013   BUN 17.6 04/23/2013   NA 138 04/23/2013   K 4.3 04/23/2013   CL 106 04/15/2013   CO2 23 04/23/2013   Lab Results  Component Value Date   ALT <6 04/23/2013   AST 12 04/23/2013   PHOS 4.0 02/01/2013   BILITOT 0.29 04/23/2013    Physical Examination:  Filed Vitals:   05/14/13 1747  BP: 136/84  Pulse: 96  Temp: 97.7 F (36.5 C)  Resp: 20    Wt Readings from Last 3 Encounters:  05/14/13 118 lb 12.8 oz (53.887 kg)  04/30/13 117 lb 1.6 oz (53.116 kg)  04/23/13 117 lb 8 oz (53.298 kg)    Neck without adenopathy or radiation reaction Lungs - Normal respiratory effort, chest expands symmetrically. Lungs are clear to auscultation, no crackles or wheezes.  Heart has regular rhythm and rate  Abdomen is soft and non tender with normal bowel sounds, PEG in place  Assessment:  Patient tolerating treatments well  Plan: Continue treatment per original radiation prescription

## 2013-05-15 ENCOUNTER — Ambulatory Visit
Admission: RE | Admit: 2013-05-15 | Discharge: 2013-05-15 | Disposition: A | Discharge status: Home / Self Care | Payer: BC Managed Care – PPO | Source: Ambulatory Visit | Attending: Radiation Oncology | Admitting: Radiation Oncology

## 2013-05-15 ENCOUNTER — Telehealth: Payer: Self-pay | Admitting: *Deleted

## 2013-05-15 NOTE — Telephone Encounter (Signed)
CALLED PATIENT TO INFORM OF NUTRITION APPT., LVM FOR A RETURN CALL

## 2013-05-16 ENCOUNTER — Ambulatory Visit
Admission: RE | Admit: 2013-05-16 | Discharge: 2013-05-16 | Disposition: A | Discharge status: Home / Self Care | Payer: BC Managed Care – PPO | Source: Ambulatory Visit | Attending: Radiation Oncology | Admitting: Radiation Oncology

## 2013-05-17 ENCOUNTER — Telehealth: Payer: Self-pay | Admitting: Hematology and Oncology

## 2013-05-17 ENCOUNTER — Ambulatory Visit (HOSPITAL_BASED_OUTPATIENT_CLINIC_OR_DEPARTMENT_OTHER): Payer: BC Managed Care – PPO | Admitting: Hematology and Oncology

## 2013-05-17 ENCOUNTER — Ambulatory Visit
Admission: RE | Admit: 2013-05-17 | Discharge: 2013-05-17 | Disposition: A | Discharge status: Home / Self Care | Payer: BC Managed Care – PPO | Source: Ambulatory Visit | Attending: Radiation Oncology | Admitting: Radiation Oncology

## 2013-05-17 ENCOUNTER — Encounter: Payer: Self-pay | Admitting: Hematology and Oncology

## 2013-05-17 ENCOUNTER — Other Ambulatory Visit (HOSPITAL_BASED_OUTPATIENT_CLINIC_OR_DEPARTMENT_OTHER): Payer: BC Managed Care – PPO

## 2013-05-17 VITALS — BP 134/70 | HR 102 | Temp 98.3°F | Resp 18 | Ht 72.0 in | Wt 117.7 lb

## 2013-05-17 DIAGNOSIS — D4620 Refractory anemia with excess of blasts, unspecified: Secondary | ICD-10-CM

## 2013-05-17 DIAGNOSIS — C321 Malignant neoplasm of supraglottis: Secondary | ICD-10-CM

## 2013-05-17 DIAGNOSIS — D649 Anemia, unspecified: Secondary | ICD-10-CM

## 2013-05-17 LAB — CBC & DIFF AND RETIC
BASO%: 0.7 % (ref 0.0–2.0)
Basophils Absolute: 0 10*3/uL (ref 0.0–0.1)
EOS%: 2.3 % (ref 0.0–7.0)
Eosinophils Absolute: 0.1 10*3/uL (ref 0.0–0.5)
HEMATOCRIT: 27.1 % — AB (ref 38.4–49.9)
HEMOGLOBIN: 8.8 g/dL — AB (ref 13.0–17.1)
Immature Retic Fract: 10.7 % — ABNORMAL HIGH (ref 3.00–10.60)
LYMPH%: 27.5 % (ref 14.0–49.0)
MCH: 35.9 pg — ABNORMAL HIGH (ref 27.2–33.4)
MCHC: 32.5 g/dL (ref 32.0–36.0)
MCV: 110.6 fL — AB (ref 79.3–98.0)
MONO#: 0.6 10*3/uL (ref 0.1–0.9)
MONO%: 13.5 % (ref 0.0–14.0)
NEUT%: 56 % (ref 39.0–75.0)
NEUTROS ABS: 2.4 10*3/uL (ref 1.5–6.5)
PLATELETS: 273 10*3/uL (ref 140–400)
RBC: 2.45 10*6/uL — ABNORMAL LOW (ref 4.20–5.82)
RDW: 21 % — ABNORMAL HIGH (ref 11.0–14.6)
Retic %: 0.94 % (ref 0.80–1.80)
Retic Ct Abs: 23.03 10*3/uL — ABNORMAL LOW (ref 34.80–93.90)
WBC: 4.3 10*3/uL (ref 4.0–10.3)
lymph#: 1.2 10*3/uL (ref 0.9–3.3)

## 2013-05-17 LAB — COMPREHENSIVE METABOLIC PANEL (CC13)
ALBUMIN: 3.2 g/dL — AB (ref 3.5–5.0)
ALT: 7 U/L (ref 0–55)
AST: 11 U/L (ref 5–34)
Alkaline Phosphatase: 175 U/L — ABNORMAL HIGH (ref 40–150)
Anion Gap: 10 mEq/L (ref 3–11)
BUN: 18.8 mg/dL (ref 7.0–26.0)
CHLORIDE: 108 meq/L (ref 98–109)
CO2: 21 mEq/L — ABNORMAL LOW (ref 22–29)
Calcium: 8.8 mg/dL (ref 8.4–10.4)
Creatinine: 0.8 mg/dL (ref 0.7–1.3)
GLUCOSE: 95 mg/dL (ref 70–140)
Potassium: 4.1 mEq/L (ref 3.5–5.1)
Sodium: 140 mEq/L (ref 136–145)
Total Bilirubin: 0.23 mg/dL (ref 0.20–1.20)
Total Protein: 8.4 g/dL — ABNORMAL HIGH (ref 6.4–8.3)

## 2013-05-17 LAB — HOLD TUBE, BLOOD BANK

## 2013-05-17 NOTE — Telephone Encounter (Signed)
gv adn printed appt sched and avs for pt for Jan

## 2013-05-17 NOTE — Progress Notes (Signed)
[facility] OFFICE PROGRESS NOTE  Patient Care Team: [name], MD as PCP - General (Family Medicine) [name], MD as Consulting Physician (Hematology and Oncology) Brooks Sailors, RN as Registered Nurse (Oncology)  DIAGNOSIS: Myelodysplastic syndrome with chronic megaloblastic anemia and recent diagnosis of cancer of the epiglottis, ongoing radiation therapy  SUMMARY OF ONCOLOGIC HISTORY: Oncology History   MDS, R-IPSS score of 3, low risk (Hg 8.9, +16 chromosome on BM, 2% blast count)   Squamous cell carcinoma of the epiglottis   Primary site: Larynx - Supraglottis (Left)   Staging method: AJCC 7th Edition   Clinical: Stage I (T1, N0, M0) signed by [name], MD on 04/30/2013 12:49 PM   Pathologic: Stage I (T1, N0, cM0) signed by [name], MD on 04/30/2013 12:49 PM   Summary: Stage I (T1, N0, cM0)       MDS (myelodysplastic syndrome), low grade   02/01/2007 Bone Marrow Biopsy BM biopsy was abnormal, overall probable low grade MDS   03/23/2011 - 02/06/2013 Chemotherapy [name], discontinued due to diagnosis of laryngeal ca    Squamous cell carcinoma of the epiglottis   03/06/2013 Procedure Biopsy from epiglottic region showed invasive [facility]   04/25/2013 Imaging Ct scan showed no other involvement in the LN    INTERVAL HISTORY: Adrian [name] 63 y.o. male returns for further followup. The patient has quit smoking recently. He is doing very well. Has very mild sore throat. He is able to eat 100% by mouth. He has not used his feeding tube recently. Denies any recent hemoptysis. The patient denies any recent signs or symptoms of bleeding such as spontaneous epistaxis, hematuria or hematochezia.  I have reviewed the past medical history, past surgical history, social history and family history with the patient and they are unchanged from previous note.  ALLERGIES:  has No Known Allergies.  MEDICATIONS:  Current Outpatient  Prescriptions  Medication Sig Dispense Refill  . cyanocobalamin 500 MCG tablet Take 500 mcg by mouth daily.      . Darbepoetin Alfa-Polysorbate (ARANESP, ALB FREE, SURECLICK IJ) Inject 0.2 mLs as directed every 21 ( twenty-one) days. Aranesp 500 MCG/ML SOLN   Active      . diazepam (VALIUM) 5 MG tablet Take 5 mg by mouth every 6 (six) hours as needed for anxiety.      Marland Kitchen emollient (BIAFINE) cream Apply 1 application topically daily. Apply to affected skin area on neck after rad txs when skin becomes irritated ot itching,dryness daily      . Fe Fum-Vit C-Vit B12-FA (HEMATOGEN FORTE PO) Take 1 capsule by mouth daily.      . folic acid (FOLVITE) 1 MG tablet Place 1 tablet (1 mg total) into feeding tube daily.      . mometasone (NASONEX) 50 MCG/ACT nasal spray Place 2 sprays into both nostrils daily.      Marland Kitchen nystatin (MYCOSTATIN) 100000 UNIT/ML suspension Use as directed 5 mLs in the mouth or throat 4 (four) times daily.      Marland Kitchen oxyCODONE-acetaminophen (PERCOCET) 5-325 MG per tablet Take one or two tablets by mouth every 4-6 hours as needed for pain.  40 tablet  0  . phenytoin (DILANTIN) 100 MG ER capsule Take 200 mg by mouth 2 (two) times daily.      . vitamin E 1000 UNIT capsule Take 500 Units by mouth daily.       No current facility-administered medications for this visit.    REVIEW OF SYSTEMS:  Constitutional: Denies fevers, chills or abnormal weight loss Neurological:Denies numbness, tingling or new weaknesses Behavioral/Psych: Mood is stable, no new changes  All other systems were reviewed with the patient and are negative.  PHYSICAL EXAMINATION: ECOG PERFORMANCE STATUS: 0 - Asymptomatic  Filed Vitals:   05/17/13 1152  BP: 134/70  Pulse: 102  Temp: 98.3 F (36.8 C)  Resp: 18   Filed Weights   05/17/13 1152  Weight: 117 lb 11.2 oz (53.388 kg)    GENERAL:alert, no distress and comfortable. He looks very thin and cachectic ABDOMEN:abdomen soft, non-tender and normal bowel  sounds. Feeding tube site looks okay Musculoskeletal:no cyanosis of digits and no clubbing  NEURO: alert & oriented x 3 with fluent speech, no focal motor/sensory deficits  LABORATORY DATA:  I have reviewed the data as listed    Component Value Date/Time   NA 140 05/17/2013 1138   NA 137 04/15/2013 1346   K 4.1 05/17/2013 1138   K 4.4 04/15/2013 1346   CL 106 04/15/2013 1346   CL 107 09/19/2012 1325   CO2 21* 05/17/2013 1138   CO2 21 04/15/2013 1346   GLUCOSE 95 05/17/2013 1138   GLUCOSE 89 04/15/2013 1346   GLUCOSE 97 09/19/2012 1325   BUN 18.8 05/17/2013 1138   BUN 19 04/15/2013 1346   CREATININE 0.8 05/17/2013 1138   CREATININE 0.80 04/15/2013 1346   CALCIUM 8.8 05/17/2013 1138   CALCIUM 8.8 04/15/2013 1346   PROT 8.4* 05/17/2013 1138   PROT 8.2 02/11/2013 0415   ALBUMIN 3.2* 05/17/2013 1138   ALBUMIN 1.8* 02/11/2013 0415   AST 11 05/17/2013 1138   AST 21 02/11/2013 0415   ALT 7 05/17/2013 1138   ALT 20 02/11/2013 0415   ALKPHOS 175* 05/17/2013 1138   ALKPHOS 76 02/11/2013 0415   BILITOT 0.23 05/17/2013 1138   BILITOT 0.3 02/11/2013 0415   GFRNONAA >90 04/15/2013 1346   GFRAA >90 04/15/2013 1346    No results found for this basename: SPEP,  UPEP,   kappa and lambda light chains    Lab Results  Component Value Date   WBC 4.3 05/17/2013   NEUTROABS 2.4 05/17/2013   HGB 8.8* 05/17/2013   HCT 27.1* 05/17/2013   MCV 110.6* 05/17/2013   PLT 273 05/17/2013      Chemistry      Component Value Date/Time   NA 140 05/17/2013 1138   NA 137 04/15/2013 1346   K 4.1 05/17/2013 1138   K 4.4 04/15/2013 1346   CL 106 04/15/2013 1346   CL 107 09/19/2012 1325   CO2 21* 05/17/2013 1138   CO2 21 04/15/2013 1346   BUN 18.8 05/17/2013 1138   BUN 19 04/15/2013 1346   CREATININE 0.8 05/17/2013 1138   CREATININE 0.80 04/15/2013 1346      Component Value Date/Time   CALCIUM 8.8 05/17/2013 1138   CALCIUM 8.8 04/15/2013 1346   ALKPHOS 175* 05/17/2013 1138   ALKPHOS 76 02/11/2013 0415   AST 11 05/17/2013 1138   AST 21 02/11/2013 0415   ALT 7  05/17/2013 1138   ALT 20 02/11/2013 0415   BILITOT 0.23 05/17/2013 1138   BILITOT 0.3 02/11/2013 0415     ASSESSMENT & PLAN:  #1 low-grade myelodysplastic syndrome The patient was receiving erythropoietin stimulating agents up until recently. Due to recent diagnosis of head and neck cancer, I would discontinue erythropoietin stimulating agents due to risk of promoting cancer growth.  I plan to keep watching his hemoglobin to keep a closer  to the 9 g range to improve outcome with radiation therapy for epiglottic cancer. He does not require blood transfusion today #2 epiglottic cancer T1, N0, M0 His doing very well. We'll continue to provide supportive care.  Orders Placed This Encounter  Procedures  . Basic metabolic panel    Standing Status: Future     Number of Occurrences:      Standing Expiration Date: 05/17/2014  . Hold Tube, Blood Bank    Standing Status: Future     Number of Occurrences:      Standing Expiration Date: 05/17/2014   All questions were answered. The patient knows to call the clinic with any problems, questions or concerns. No barriers to learning was detected. I spent 15 minutes counseling the patient face to face. The total time spent in the appointment was 20 minutes and more than 50% was on counseling and review of test results     [facility], [place], MD 05/17/2013 1:45 PM

## 2013-05-20 ENCOUNTER — Ambulatory Visit
Admission: RE | Admit: 2013-05-20 | Discharge: 2013-05-20 | Disposition: A | Discharge status: Home / Self Care | Payer: BC Managed Care – PPO | Source: Ambulatory Visit | Attending: Radiation Oncology | Admitting: Radiation Oncology

## 2013-05-21 ENCOUNTER — Ambulatory Visit
Admission: RE | Admit: 2013-05-21 | Discharge: 2013-05-21 | Disposition: A | Discharge status: Home / Self Care | Payer: BC Managed Care – PPO | Source: Ambulatory Visit | Attending: Radiation Oncology | Admitting: Radiation Oncology

## 2013-05-21 VITALS — BP 106/71 | HR 104 | Temp 98.5°F | Ht 72.0 in | Wt 117.9 lb

## 2013-05-21 DIAGNOSIS — C321 Malignant neoplasm of supraglottis: Secondary | ICD-10-CM

## 2013-05-21 NOTE — Progress Notes (Signed)
Adrian [name] has had 7 fractions to his epiglottis.  He denies pain, sore throat, voice changes or trouble eating.  He denies a dry mouth.  His oral mucosa is intact with a yellowish coating on his tongue.  His skin is intact and he has not started using biafine yet.  Advised him to start using it twice a day.

## 2013-05-21 NOTE — Progress Notes (Signed)
[facility]     Rexene Edison, M.D. [place], [place] [address]               Blair Promise, M.D., Ph.D. Phone: [phone]      [place] A. Tammi Klippel, M.D. Fax: [phone]      Jodelle Gross, M.D., Ph.D.         Thea Silversmith, M.D.         Wyvonnia Lora, M.D Weekly Treatment Management Note  Name: Adrian [name]     MRN: [id number]        CSN: [id number] Date: 05/21/2013      DOB: [birth year]  CC: [name], MD         [name]    Status: Outpatient  Diagnosis: The encounter diagnosis was Squamous cell carcinoma of the epiglottis.  Current Dose: 14 Gy  Current Fraction: 7  Planned Dose: 70 GY  Narrative: Adrian [name] was seen today for weekly treatment management. The chart was checked and MVCT  were reviewed. He continues to tolerate his treatments well at this time without any side effects. He is eating soft foods at this time. He denies any breathing problems.  Review of patient's allergies indicates no known allergies. Current Outpatient Prescriptions  Medication Sig Dispense Refill  . cyanocobalamin 500 MCG tablet Take 500 mcg by mouth daily.      . diazepam (VALIUM) 5 MG tablet Take 5 mg by mouth every 6 (six) hours as needed for anxiety.      . Fe Fum-Vit C-Vit B12-FA (HEMATOGEN FORTE PO) Take 1 capsule by mouth daily.      . folic acid (FOLVITE) 1 MG tablet Place 1 tablet (1 mg total) into feeding tube daily.      . mometasone (NASONEX) 50 MCG/ACT nasal spray Place 2 sprays into both nostrils daily.      Marland Kitchen oxyCODONE-acetaminophen (PERCOCET) 5-325 MG per tablet Take one or two tablets by mouth every 4-6 hours as needed for pain.  40 tablet  0  . phenytoin (DILANTIN) 100 MG ER capsule Take 200 mg by mouth 2 (two) times daily.      . vitamin E 1000 UNIT capsule Take 500 Units by mouth daily.      . Darbepoetin Alfa-Polysorbate (ARANESP, ALB FREE, SURECLICK IJ) Inject 0.2 mLs as directed every 21 (  twenty-one) days. Aranesp 500 MCG/ML SOLN   Active      . emollient (BIAFINE) cream Apply 1 application topically daily. Apply to affected skin area on neck after rad txs when skin becomes irritated ot itching,dryness daily      . nystatin (MYCOSTATIN) 100000 UNIT/ML suspension Use as directed 5 mLs in the mouth or throat 4 (four) times daily.       No current facility-administered medications for this encounter.   Labs:  Lab Results  Component Value Date   WBC 4.3 05/17/2013   HGB 8.8* 05/17/2013   HCT 27.1* 05/17/2013   MCV 110.6* 05/17/2013   PLT 273 05/17/2013   Lab Results  Component Value Date   CREATININE 0.8 05/17/2013   BUN 18.8 05/17/2013   NA 140 05/17/2013   K 4.1 05/17/2013   CL 106 04/15/2013   CO2 21* 05/17/2013   Lab Results  Component Value Date   ALT 7 05/17/2013   AST 11 05/17/2013   PHOS 4.0 02/01/2013   BILITOT 0.23 05/17/2013    Physical Examination:  Filed Vitals:   05/21/13 1715  BP: 106/71  Pulse: 104  Temp: 98.5 F (36.9 C)    Wt Readings from Last 3 Encounters:  05/21/13 117 lb 14.4 oz (53.479 kg)  05/17/13 117 lb 11.2 oz (53.388 kg)  05/14/13 118 lb 12.8 oz (53.887 kg)    The oral cavity is moist without secondary infection. The neck area reveals no palpable adenopathy. Mild hyperpigmentation changes noted.  Lungs - Normal respiratory effort, chest expands symmetrically. Lungs are clear to auscultation, no crackles or wheezes.  Heart has regular rhythm and rate  Abdomen is soft and non tender with normal bowel sounds  Assessment:  Patient tolerating treatments well  Plan: Continue treatment per original radiation prescription

## 2013-05-22 ENCOUNTER — Encounter: Payer: Self-pay | Admitting: *Deleted

## 2013-05-22 ENCOUNTER — Ambulatory Visit
Admission: RE | Admit: 2013-05-22 | Discharge: 2013-05-22 | Disposition: A | Discharge status: Home / Self Care | Payer: BC Managed Care – PPO | Source: Ambulatory Visit | Attending: Radiation Oncology | Admitting: Radiation Oncology

## 2013-05-22 ENCOUNTER — Other Ambulatory Visit: Payer: Self-pay | Admitting: Hematology and Oncology

## 2013-05-22 ENCOUNTER — Ambulatory Visit: Payer: BC Managed Care – PPO | Admitting: Nutrition

## 2013-05-22 NOTE — Progress Notes (Signed)
This patient is a 63 year old male diagnosed with cancer of the epiglottis.  He is a patient of Dr. Alvy Bimler.  Past medical history includes tobacco, megaloblastic anemia, emphysema, arthritis, reflux, shortness of breath, and pneumonia.  Medications include Folvite, Percocet, and vitamin E.  Labs were reviewed.  Height: 6 feet 0 inches. Weight: 117.7 pounds. Usual body weight: 122 pounds in May 2014. BMI: 15.96.  Estimated nutrition needs: 1775-1975 calories, 80-95 g protein, 2 L fluid.  Patient is status post feeding tube placement.  He is receiving radiation therapy daily.  He denies nutrition related issues and is consuming soft foods along with liquids.  He has lost approximately 4 pounds from his usual body weight.  He has only been flushing his feeding tube once every 2-3 days.  Nutrition diagnosis: Inadequate oral intake related to new diagnosis of epiglottis cancer and associated treatments as evidenced by 4% weight loss from usual body weight.  Intervention: I educated patient to flush his feeding tube every day using 30-60 cc of warm water.  I reviewed strategies for increasing oral intake consuming soft, moist foods 6-8 times daily.  I recommended patientconsume Ensure Plus or another oral nutrition supplement 2-3 times daily.  I provided patient with samples of oral nutrition supplement and will give patient one case of complementary Ensure Plus.  I provided fact sheets for patient.  Questions were answered.  Teach back method used.  Monitoring, evaluation, goals: Patient will tolerate increased calories and protein to minimize further weight loss.  Tube feedings to be initiated when patient unable to consume adequate oral intake.  Next visit: Patient will followup with me later this week.

## 2013-05-22 NOTE — Progress Notes (Signed)
Met with patient during scheduled RT, escorted him to appt with nutritionist Dory Peru.   Patient did not express any concerns or needs.  Stated he has been eating/drinking at baseline.  I reeducated patient re: Biafine application, importance of hydration, daily flushing of PEG.  Patient stated understanding.  Continuing to navigate as L1 (new) patient.  Gayleen Orem, RN, BSN, [facility] Oncology Navigator [phone]

## 2013-05-23 ENCOUNTER — Ambulatory Visit (HOSPITAL_BASED_OUTPATIENT_CLINIC_OR_DEPARTMENT_OTHER): Payer: BC Managed Care – PPO | Admitting: Hematology and Oncology

## 2013-05-23 ENCOUNTER — Telehealth: Payer: Self-pay | Admitting: Hematology and Oncology

## 2013-05-23 ENCOUNTER — Other Ambulatory Visit (HOSPITAL_BASED_OUTPATIENT_CLINIC_OR_DEPARTMENT_OTHER): Payer: BC Managed Care – PPO

## 2013-05-23 ENCOUNTER — Encounter: Payer: Self-pay | Admitting: Hematology and Oncology

## 2013-05-23 ENCOUNTER — Ambulatory Visit
Admission: RE | Admit: 2013-05-23 | Discharge: 2013-05-23 | Disposition: A | Discharge status: Home / Self Care | Payer: BC Managed Care – PPO | Source: Ambulatory Visit | Attending: Radiation Oncology | Admitting: Radiation Oncology

## 2013-05-23 VITALS — BP 126/75 | HR 100 | Temp 97.1°F | Resp 18 | Ht 72.0 in | Wt 118.6 lb

## 2013-05-23 DIAGNOSIS — D4620 Refractory anemia with excess of blasts, unspecified: Secondary | ICD-10-CM

## 2013-05-23 DIAGNOSIS — D649 Anemia, unspecified: Secondary | ICD-10-CM

## 2013-05-23 DIAGNOSIS — D46Z Other myelodysplastic syndromes: Secondary | ICD-10-CM

## 2013-05-23 DIAGNOSIS — C321 Malignant neoplasm of supraglottis: Secondary | ICD-10-CM

## 2013-05-23 LAB — CBC WITH DIFFERENTIAL/PLATELET
BASO%: 0.8 % (ref 0.0–2.0)
BASOS ABS: 0 10*3/uL (ref 0.0–0.1)
EOS%: 1.9 % (ref 0.0–7.0)
Eosinophils Absolute: 0.1 10*3/uL (ref 0.0–0.5)
HCT: 25.4 % — ABNORMAL LOW (ref 38.4–49.9)
HEMOGLOBIN: 8.7 g/dL — AB (ref 13.0–17.1)
LYMPH#: 0.8 10*3/uL — AB (ref 0.9–3.3)
LYMPH%: 21.7 % (ref 14.0–49.0)
MCH: 38.8 pg — ABNORMAL HIGH (ref 27.2–33.4)
MCHC: 34.3 g/dL (ref 32.0–36.0)
MCV: 113.2 fL — AB (ref 79.3–98.0)
MONO#: 0.4 10*3/uL (ref 0.1–0.9)
MONO%: 10.6 % (ref 0.0–14.0)
NEUT#: 2.5 10*3/uL (ref 1.5–6.5)
NEUT%: 65 % (ref 39.0–75.0)
Platelets: 297 10*3/uL (ref 140–400)
RBC: 2.25 10*6/uL — ABNORMAL LOW (ref 4.20–5.82)
RDW: 22.8 % — ABNORMAL HIGH (ref 11.0–14.6)
WBC: 3.9 10*3/uL — ABNORMAL LOW (ref 4.0–10.3)

## 2013-05-23 LAB — BASIC METABOLIC PANEL (CC13)
ANION GAP: 10 meq/L (ref 3–11)
BUN: 19.5 mg/dL (ref 7.0–26.0)
CO2: 21 mEq/L — ABNORMAL LOW (ref 22–29)
Calcium: 8.8 mg/dL (ref 8.4–10.4)
Chloride: 109 mEq/L (ref 98–109)
Creatinine: 0.9 mg/dL (ref 0.7–1.3)
GLUCOSE: 112 mg/dL (ref 70–140)
POTASSIUM: 4.3 meq/L (ref 3.5–5.1)
SODIUM: 140 meq/L (ref 136–145)

## 2013-05-23 LAB — HOLD TUBE, BLOOD BANK

## 2013-05-23 NOTE — Telephone Encounter (Signed)
Gave pt appt for  Lab and MD on 05/30/13

## 2013-05-23 NOTE — Progress Notes (Signed)
[facility] OFFICE PROGRESS NOTE  Patient Care Team: [name], MD as PCP - General (Family Medicine) [name], MD as Consulting Physician (Hematology and Oncology) Brooks Sailors, RN as Registered Nurse (Oncology)  DIAGNOSIS: Myelodysplastic syndrome with chronic megaloblastic anemia and recent cancer of the epiglottis, ongoing radiation therapy  SUMMARY OF ONCOLOGIC HISTORY: Oncology History   MDS, R-IPSS score of 3, low risk (Hg 8.9, +16 chromosome on BM, 2% blast count)   Squamous cell carcinoma of the epiglottis   Primary site: Larynx - Supraglottis (Left)   Staging method: AJCC 7th Edition   Clinical: Stage I (T1, N0, M0) signed by [name], MD on 04/30/2013 12:49 PM   Pathologic: Stage I (T1, N0, cM0) signed by [name], MD on 04/30/2013 12:49 PM   Summary: Stage I (T1, N0, cM0)       MDS (myelodysplastic syndrome), low grade   02/01/2007 Bone Marrow Biopsy BM biopsy was abnormal, overall probable low grade MDS   03/23/2011 - 02/06/2013 Chemotherapy [name], discontinued due to diagnosis of laryngeal ca    Squamous cell carcinoma of the epiglottis   03/06/2013 Procedure Biopsy from epiglottic region showed invasive [facility]   04/25/2013 Imaging Ct scan showed no other involvement in the LN    INTERVAL HISTORY: Adrian [name] 63 y.o. male returns for further followup. The patient denies any recent smoking. He denies any sore throat. He is able to eat food 100% by mouth and has not used his feeding tube. He denies any chest pain, dizziness or shortness of breath  I have reviewed the past medical history, past surgical history, social history and family history with the patient and they are unchanged from previous note.  ALLERGIES:  has No Known Allergies.  MEDICATIONS:  Current Outpatient Prescriptions  Medication Sig Dispense Refill  . cyanocobalamin 500 MCG tablet Take 500 mcg by mouth daily.      . diazepam (VALIUM) 5 MG  tablet Take 5 mg by mouth every 6 (six) hours as needed for anxiety.      Marland Kitchen emollient (BIAFINE) cream Apply 1 application topically daily. Apply to affected skin area on neck after rad txs when skin becomes irritated ot itching,dryness daily      . Fe Fum-Vit C-Vit B12-FA (HEMATOGEN FORTE PO) Take 1 capsule by mouth daily.      . folic acid (FOLVITE) 1 MG tablet Place 1 tablet (1 mg total) into feeding tube daily.      . mometasone (NASONEX) 50 MCG/ACT nasal spray Place 2 sprays into both nostrils daily.      . phenytoin (DILANTIN) 100 MG ER capsule Take 200 mg by mouth 2 (two) times daily.      . vitamin E 1000 UNIT capsule Take 500 Units by mouth daily.      . Darbepoetin Alfa-Polysorbate (ARANESP, ALB FREE, SURECLICK IJ) Inject 0.2 mLs as directed every 21 ( twenty-one) days. Aranesp 500 MCG/ML SOLN   Active      . nystatin (MYCOSTATIN) 100000 UNIT/ML suspension Use as directed 5 mLs in the mouth or throat 4 (four) times daily.      Marland Kitchen oxyCODONE-acetaminophen (PERCOCET) 5-325 MG per tablet Take one or two tablets by mouth every 4-6 hours as needed for pain.  40 tablet  0   No current facility-administered medications for this visit.    REVIEW OF SYSTEMS:   Constitutional: Denies fevers, chills or abnormal weight loss Eyes: Denies blurriness of vision Ears, nose, mouth,  throat, and face: Denies mucositis or sore throat Respiratory: Denies cough, dyspnea or wheezes Cardiovascular: Denies palpitation, chest discomfort or lower extremity swelling Gastrointestinal:  Denies nausea, heartburn or change in bowel habits Skin: Denies abnormal skin rashes Lymphatics: Denies new lymphadenopathy or easy bruising Neurological:Denies numbness, tingling or new weaknesses Behavioral/Psych: Mood is stable, no new changes  All other systems were reviewed with the patient and are negative.  PHYSICAL EXAMINATION: ECOG PERFORMANCE STATUS: 0 - Asymptomatic  Filed Vitals:   05/23/13 1349  BP: 126/75   Pulse: 100  Temp: 97.1 F (36.2 C)  Resp: 18   Filed Weights   05/23/13 1349  Weight: 118 lb 9.6 oz (53.797 kg)    GENERAL:alert, no distress and comfortable. He looks thin and pale SKIN: skin color is pale, texture, turgor are normal, no rashes or significant lesions EYES: normal, Conjunctiva are pale and non-injected, sclera clear OROPHARYNX:no exudate, no erythema and lips, buccal mucosa, and tongue normal  NECK: supple, thyroid normal size, non-tender, without nodularity LYMPH:  no palpable lymphadenopathy in the cervical, axillary or inguinal LUNGS: clear to auscultation and percussion with normal breathing effort HEART: regular rate & rhythm and no murmurs and no lower extremity edema ABDOMEN:abdomen soft, non-tender and normal bowel sounds. Feeding tube site looks okay Musculoskeletal:no cyanosis of digits and no clubbing  NEURO: alert & oriented x 3 with fluent speech, no focal motor/sensory deficits  LABORATORY DATA:  I have reviewed the data as listed    Component Value Date/Time   NA 140 05/23/2013 1237   NA 137 04/15/2013 1346   K 4.3 05/23/2013 1237   K 4.4 04/15/2013 1346   CL 106 04/15/2013 1346   CL 107 09/19/2012 1325   CO2 21* 05/23/2013 1237   CO2 21 04/15/2013 1346   GLUCOSE 112 05/23/2013 1237   GLUCOSE 89 04/15/2013 1346   GLUCOSE 97 09/19/2012 1325   BUN 19.5 05/23/2013 1237   BUN 19 04/15/2013 1346   CREATININE 0.9 05/23/2013 1237   CREATININE 0.80 04/15/2013 1346   CALCIUM 8.8 05/23/2013 1237   CALCIUM 8.8 04/15/2013 1346   PROT 8.4* 05/17/2013 1138   PROT 8.2 02/11/2013 0415   ALBUMIN 3.2* 05/17/2013 1138   ALBUMIN 1.8* 02/11/2013 0415   AST 11 05/17/2013 1138   AST 21 02/11/2013 0415   ALT 7 05/17/2013 1138   ALT 20 02/11/2013 0415   ALKPHOS 175* 05/17/2013 1138   ALKPHOS 76 02/11/2013 0415   BILITOT 0.23 05/17/2013 1138   BILITOT 0.3 02/11/2013 0415   GFRNONAA >90 04/15/2013 1346   GFRAA >90 04/15/2013 1346    No results found for this basename: SPEP,  UPEP,    kappa and lambda light chains    Lab Results  Component Value Date   WBC 3.9* 05/23/2013   NEUTROABS 2.5 05/23/2013   HGB 8.7* 05/23/2013   HCT 25.4* 05/23/2013   MCV 113.2* 05/23/2013   PLT 297 05/23/2013      Chemistry      Component Value Date/Time   NA 140 05/23/2013 1237   NA 137 04/15/2013 1346   K 4.3 05/23/2013 1237   K 4.4 04/15/2013 1346   CL 106 04/15/2013 1346   CL 107 09/19/2012 1325   CO2 21* 05/23/2013 1237   CO2 21 04/15/2013 1346   BUN 19.5 05/23/2013 1237   BUN 19 04/15/2013 1346   CREATININE 0.9 05/23/2013 1237   CREATININE 0.80 04/15/2013 1346      Component Value Date/Time   CALCIUM  8.8 05/23/2013 1237   CALCIUM 8.8 04/15/2013 1346   ALKPHOS 175* 05/17/2013 1138   ALKPHOS 76 02/11/2013 0415   AST 11 05/17/2013 1138   AST 21 02/11/2013 0415   ALT 7 05/17/2013 1138   ALT 20 02/11/2013 0415   BILITOT 0.23 05/17/2013 1138   BILITOT 0.3 02/11/2013 0415     ASSESSMENT & PLAN:  #1 low-grade myelodysplastic syndrome The patient was receiving erythropoietin stimulating agents up until recently. Due to recent diagnosis of head and neck cancer, I would discontinue erythropoietin stimulating agents due to risk of promoting cancer growth.  I plan to keep watching his hemoglobin to keep a closer to the 9 g range to improve outcome with radiation therapy for epiglottic cancer. He does not require blood transfusion today #2 epiglottic cancer T1, N0, M0 His doing very well. We'll continue to provide supportive care  Orders Placed This Encounter  Procedures  . CBC & Diff and Retic    Standing Status: Standing     Number of Occurrences: 3     Standing Expiration Date: 05/23/2014  . Hold Tube, Blood Bank    Standing Status: Standing     Number of Occurrences: 3     Standing Expiration Date: 05/23/2014   All questions were answered. The patient knows to call the clinic with any problems, questions or concerns. No barriers to learning was detected. I spent 15 minutes counseling the patient  face to face. The total time spent in the appointment was 20 minutes and more than 50% was on counseling and review of test results     [place], [place], MD 05/23/2013 4:36 PM

## 2013-05-24 ENCOUNTER — Ambulatory Visit
Admission: RE | Admit: 2013-05-24 | Discharge: 2013-05-24 | Disposition: A | Discharge status: Home / Self Care | Payer: BC Managed Care – PPO | Source: Ambulatory Visit | Attending: Radiation Oncology | Admitting: Radiation Oncology

## 2013-05-24 ENCOUNTER — Encounter: Payer: Self-pay | Admitting: Nutrition

## 2013-05-24 NOTE — Progress Notes (Signed)
Patient is requesting second complimentary case of Ensure Plus.  Will provide for patient at his request.  On Monday, January 19.

## 2013-05-27 ENCOUNTER — Ambulatory Visit
Admission: RE | Admit: 2013-05-27 | Discharge: 2013-05-27 | Disposition: A | Discharge status: Home / Self Care | Payer: BC Managed Care – PPO | Source: Ambulatory Visit | Attending: Radiation Oncology | Admitting: Radiation Oncology

## 2013-05-28 ENCOUNTER — Encounter: Payer: Self-pay | Admitting: Radiation Oncology

## 2013-05-28 ENCOUNTER — Ambulatory Visit
Admission: RE | Admit: 2013-05-28 | Discharge: 2013-05-28 | Disposition: A | Discharge status: Home / Self Care | Payer: BC Managed Care – PPO | Source: Ambulatory Visit | Attending: Radiation Oncology | Admitting: Radiation Oncology

## 2013-05-28 VITALS — BP 110/61 | HR 98 | Temp 98.5°F | Resp 20 | Wt 120.6 lb

## 2013-05-28 DIAGNOSIS — C321 Malignant neoplasm of supraglottis: Secondary | ICD-10-CM

## 2013-05-28 NOTE — Progress Notes (Signed)
Pt denies pain, fatigue, loss of appetite, difficulty eating, swallowing. He eats soft foods due to some difficulty chewing w/loss of teeth.

## 2013-05-28 NOTE — Progress Notes (Signed)
[place]     Rexene Edison, M.D. [place], [place] [address]               Blair Promise, M.D., Ph.D. Phone: [phone]      [place] A. Tammi Klippel, M.D. Fax: [phone]      Jodelle Gross, M.D., Ph.D.         Thea Silversmith, M.D.         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  Current Dose: 22 Gy  Current Fraction: 11  Planned Dose: 70 Gy  Narrative: Adrian [name] was seen today for weekly treatment management. The chart was checked and MVCT  were reviewed. He continues to tolerate his treatments well. He has noticed a slight sore throat. He is not using his feeding tube as of yet but does flush on a regular basis  Review of patient's allergies indicates no known allergies. Current Outpatient Prescriptions  Medication Sig Dispense Refill  . cyanocobalamin 500 MCG tablet Take 500 mcg by mouth daily.      . Darbepoetin Alfa-Polysorbate (ARANESP, ALB FREE, SURECLICK IJ) Inject 0.2 mLs as directed every 21 ( twenty-one) days. Aranesp 500 MCG/ML SOLN   Active      . diazepam (VALIUM) 5 MG tablet Take 5 mg by mouth every 6 (six) hours as needed for anxiety.      Marland Kitchen emollient (BIAFINE) cream Apply 1 application topically daily. Apply to affected skin area on neck after rad txs when skin becomes irritated ot itching,dryness daily      . Fe Fum-Vit C-Vit B12-FA (HEMATOGEN FORTE PO) Take 1 capsule by mouth daily.      . folic acid (FOLVITE) 1 MG tablet Place 1 tablet (1 mg total) into feeding tube daily.      . mometasone (NASONEX) 50 MCG/ACT nasal spray Place 2 sprays into both nostrils daily.      Marland Kitchen nystatin (MYCOSTATIN) 100000 UNIT/ML suspension Use as directed  5 mLs in the mouth or throat 4 (four) times daily.      Marland Kitchen oxyCODONE-acetaminophen (PERCOCET) 5-325 MG per tablet Take one or two tablets by mouth every 4-6 hours as needed for pain.  40 tablet  0  . phenytoin (DILANTIN) 100 MG ER capsule Take 200 mg by mouth 2 (two) times daily.      . vitamin E 1000 UNIT capsule Take 500 Units by mouth daily.       No current facility-administered medications for this encounter.   Labs:  Lab Results  Component Value Date   WBC 3.9* 05/23/2013   HGB 8.7* 05/23/2013   HCT 25.4* 05/23/2013   MCV 113.2* 05/23/2013   PLT 297 05/23/2013   Lab Results  Component Value Date   CREATININE 0.9 05/23/2013   BUN 19.5 05/23/2013   NA 140 05/23/2013   K 4.3 05/23/2013   CL 106 04/15/2013   CO2 21* 05/23/2013   Lab Results  Component Value Date   ALT 7 05/17/2013   AST 11 05/17/2013   PHOS 4.0 02/01/2013   BILITOT 0.23 05/17/2013  Physical Examination:  Filed Vitals:   05/28/13 1324  BP: 110/61  Pulse: 98  Temp: 98.5 F (36.9 C)  Resp: 20    Wt Readings from Last 3 Encounters:  05/28/13 120 lb 9.6 oz (54.704 kg)  05/23/13 118 lb 9.6 oz (53.797 kg)  05/21/13 117 lb 14.4 oz (53.479 kg)    The oral cavity is moist without secondary infection. Examination of the neck reveals no palpable adenopathy. The patient has some hyperpigmentation changes. Lungs - Normal respiratory effort, chest expands symmetrically. Lungs are clear to auscultation, no crackles or wheezes.  Heart has regular rhythm and rate  Abdomen is soft and non tender with normal bowel sounds  Assessment:  Patient tolerating treatments well  Plan: Continue treatment per original radiation prescription

## 2013-05-29 ENCOUNTER — Ambulatory Visit
Admission: RE | Admit: 2013-05-29 | Discharge: 2013-05-29 | Disposition: A | Discharge status: Home / Self Care | Payer: BC Managed Care – PPO | Source: Ambulatory Visit | Attending: Radiation Oncology | Admitting: Radiation Oncology

## 2013-05-29 ENCOUNTER — Ambulatory Visit: Payer: BC Managed Care – PPO

## 2013-05-29 ENCOUNTER — Other Ambulatory Visit: Payer: BC Managed Care – PPO | Admitting: Lab

## 2013-05-30 ENCOUNTER — Ambulatory Visit
Admission: RE | Admit: 2013-05-30 | Discharge: 2013-05-30 | Disposition: A | Discharge status: Home / Self Care | Payer: BC Managed Care – PPO | Source: Ambulatory Visit | Attending: Radiation Oncology | Admitting: Radiation Oncology

## 2013-05-30 ENCOUNTER — Telehealth: Payer: Self-pay | Admitting: Hematology and Oncology

## 2013-05-30 ENCOUNTER — Ambulatory Visit (HOSPITAL_BASED_OUTPATIENT_CLINIC_OR_DEPARTMENT_OTHER): Payer: BC Managed Care – PPO | Admitting: Hematology and Oncology

## 2013-05-30 ENCOUNTER — Other Ambulatory Visit (HOSPITAL_BASED_OUTPATIENT_CLINIC_OR_DEPARTMENT_OTHER): Payer: BC Managed Care – PPO

## 2013-05-30 ENCOUNTER — Encounter: Payer: Self-pay | Admitting: Nutrition

## 2013-05-30 VITALS — BP 129/82 | HR 67 | Temp 97.2°F | Resp 17 | Ht 72.0 in | Wt 117.1 lb

## 2013-05-30 DIAGNOSIS — R634 Abnormal weight loss: Secondary | ICD-10-CM

## 2013-05-30 DIAGNOSIS — D4620 Refractory anemia with excess of blasts, unspecified: Secondary | ICD-10-CM

## 2013-05-30 DIAGNOSIS — C321 Malignant neoplasm of supraglottis: Secondary | ICD-10-CM

## 2013-05-30 LAB — CBC & DIFF AND RETIC
BASO%: 0.4 % (ref 0.0–2.0)
BASOS ABS: 0 10*3/uL (ref 0.0–0.1)
EOS%: 1.5 % (ref 0.0–7.0)
Eosinophils Absolute: 0.1 10*3/uL (ref 0.0–0.5)
HCT: 26.4 % — ABNORMAL LOW (ref 38.4–49.9)
HEMOGLOBIN: 8.8 g/dL — AB (ref 13.0–17.1)
Immature Retic Fract: 11 % — ABNORMAL HIGH (ref 3.00–10.60)
LYMPH%: 21.8 % (ref 14.0–49.0)
MCH: 37.1 pg — AB (ref 27.2–33.4)
MCHC: 33.3 g/dL (ref 32.0–36.0)
MCV: 111.4 fL — AB (ref 79.3–98.0)
MONO#: 0.5 10*3/uL (ref 0.1–0.9)
MONO%: 11 % (ref 0.0–14.0)
NEUT#: 3.1 10*3/uL (ref 1.5–6.5)
NEUT%: 65.3 % (ref 39.0–75.0)
Platelets: 280 10*3/uL (ref 140–400)
RBC: 2.37 10*6/uL — ABNORMAL LOW (ref 4.20–5.82)
RDW: 19.7 % — AB (ref 11.0–14.6)
RETIC CT ABS: 27.26 10*3/uL — AB (ref 34.80–93.90)
Retic %: 1.15 % (ref 0.80–1.80)
WBC: 4.7 10*3/uL (ref 4.0–10.3)
lymph#: 1 10*3/uL (ref 0.9–3.3)

## 2013-05-30 LAB — HOLD TUBE, BLOOD BANK

## 2013-05-30 MED ORDER — OXYCODONE-ACETAMINOPHEN 5-325 MG PO TABS: 1.0000 | ORAL_TABLET | Freq: Four times a day (QID) | ORAL | Status: DC | PRN

## 2013-05-30 NOTE — Progress Notes (Signed)
[facility] OFFICE PROGRESS NOTE  Patient Care Team: [name], MD as PCP - General (Family Medicine) [name], MD as Consulting Physician (Hematology and Oncology) Brooks Sailors, RN as Registered Nurse (Oncology)  DIAGNOSIS: Chronic anemia  SUMMARY OF ONCOLOGIC HISTORY: Oncology History   MDS, R-IPSS score of 3, low risk (Hg 8.9, +16 chromosome on BM, 2% blast count)   Squamous cell carcinoma of the epiglottis   Primary site: Larynx - Supraglottis (Left)   Staging method: AJCC 7th Edition   Clinical: Stage I (T1, N0, M0) signed by [name], MD on 04/30/2013 12:49 PM   Pathologic: Stage I (T1, N0, cM0) signed by [name], MD on 04/30/2013 12:49 PM   Summary: Stage I (T1, N0, cM0)       MDS (myelodysplastic syndrome), low grade   02/01/2007 Bone Marrow Biopsy BM biopsy was abnormal, overall probable low grade MDS   03/23/2011 - 02/06/2013 Chemotherapy [name], discontinued due to diagnosis of laryngeal ca    Squamous cell carcinoma of the epiglottis   03/06/2013 Procedure Biopsy from epiglottic region showed invasive [facility]   04/25/2013 Imaging Ct scan showed no other involvement in the LN    INTERVAL HISTORY: Adrian [name] 63 y.o. male returns for further followup. He has significant, progression of his sore throat. He has lost some weight. He has difficulty swallowing food and has lost some weight.  I have reviewed the past medical history, past surgical history, social history and family history with the patient and they are unchanged from previous note.  ALLERGIES:  has No Known Allergies.  MEDICATIONS:  Current Outpatient Prescriptions  Medication Sig Dispense Refill  . cyanocobalamin 500 MCG tablet Take 500 mcg by mouth daily.      . Darbepoetin Alfa-Polysorbate (ARANESP, ALB FREE, SURECLICK IJ) Inject 0.2 mLs as directed every 21 ( twenty-one) days. Aranesp 500 MCG/ML SOLN   Active      . diazepam (VALIUM) 5 MG tablet  Take 5 mg by mouth every 6 (six) hours as needed for anxiety.      Marland Kitchen emollient (BIAFINE) cream Apply 1 application topically daily. Apply to affected skin area on neck after rad txs when skin becomes irritated ot itching,dryness daily      . Fe Fum-Vit C-Vit B12-FA (HEMATOGEN FORTE PO) Take 1 capsule by mouth daily.      . folic acid (FOLVITE) 1 MG tablet Place 1 tablet (1 mg total) into feeding tube daily.      . mometasone (NASONEX) 50 MCG/ACT nasal spray Place 2 sprays into both nostrils daily.      Marland Kitchen nystatin (MYCOSTATIN) 100000 UNIT/ML suspension Use as directed 5 mLs in the mouth or throat 4 (four) times daily.      Marland Kitchen oxyCODONE-acetaminophen (PERCOCET) 5-325 MG per tablet Take 1 tablet by mouth every 6 (six) hours as needed for severe pain.  40 tablet  0  . phenytoin (DILANTIN) 100 MG ER capsule Take 200 mg by mouth 2 (two) times daily.      . vitamin E 1000 UNIT capsule Take 500 Units by mouth daily.       No current facility-administered medications for this visit.    REVIEW OF SYSTEMS:   Eyes: Denies blurriness of vision Respiratory: Denies cough, dyspnea or wheezes Cardiovascular: Denies palpitation, chest discomfort or lower extremity swelling Gastrointestinal:  Denies nausea, heartburn or change in bowel habits Skin: Denies abnormal skin rashes Lymphatics: Denies new lymphadenopathy or easy bruising Neurological:Denies  numbness, tingling or new weaknesses Behavioral/Psych: Mood is stable, no new changes  All other systems were reviewed with the patient and are negative.  PHYSICAL EXAMINATION: ECOG PERFORMANCE STATUS: 1 - Symptomatic but completely ambulatory  Filed Vitals:   05/30/13 1238  BP: 129/82  Pulse: 67  Temp: 97.2 F (36.2 C)  Resp: 17   Filed Weights   05/30/13 1238  Weight: 117 lb 1.6 oz (53.116 kg)    GENERAL:alert, no distress and comfortable. He looks thin and cachectic SKIN: skin color, texture, turgor are normal, no rashes or significant  lesions EYES: normal, Conjunctiva are pink and non-injected, sclera clear OROPHARYNX:no exudate, lips, buccal mucosa, and tongue normal . Noted some mild erythema at the back of his throat but no evidence of thrush NECK: supple, thyroid normal size, non-tender, without nodularity. Tracheostomy site looks okay LYMPH:  no palpable lymphadenopathy in the cervical, axillary or inguinal LUNGS: clear to auscultation and percussion with normal breathing effort HEART: regular rate & rhythm and no murmurs and no lower extremity edema ABDOMEN:abdomen soft, non-tender and normal bowel sounds. Feeding tube site looks okay Musculoskeletal:no cyanosis of digits and no clubbing  NEURO: alert & oriented x 3 with fluent speech, no focal motor/sensory deficits  LABORATORY DATA:  I have reviewed the data as listed    Component Value Date/Time   NA 140 05/23/2013 1237   NA 137 04/15/2013 1346   K 4.3 05/23/2013 1237   K 4.4 04/15/2013 1346   CL 106 04/15/2013 1346   CL 107 09/19/2012 1325   CO2 21* 05/23/2013 1237   CO2 21 04/15/2013 1346   GLUCOSE 112 05/23/2013 1237   GLUCOSE 89 04/15/2013 1346   GLUCOSE 97 09/19/2012 1325   BUN 19.5 05/23/2013 1237   BUN 19 04/15/2013 1346   CREATININE 0.9 05/23/2013 1237   CREATININE 0.80 04/15/2013 1346   CALCIUM 8.8 05/23/2013 1237   CALCIUM 8.8 04/15/2013 1346   PROT 8.4* 05/17/2013 1138   PROT 8.2 02/11/2013 0415   ALBUMIN 3.2* 05/17/2013 1138   ALBUMIN 1.8* 02/11/2013 0415   AST 11 05/17/2013 1138   AST 21 02/11/2013 0415   ALT 7 05/17/2013 1138   ALT 20 02/11/2013 0415   ALKPHOS 175* 05/17/2013 1138   ALKPHOS 76 02/11/2013 0415   BILITOT 0.23 05/17/2013 1138   BILITOT 0.3 02/11/2013 0415   GFRNONAA >90 04/15/2013 1346   GFRAA >90 04/15/2013 1346    No results found for this basename: SPEP,  UPEP,   kappa and lambda light chains    Lab Results  Component Value Date   WBC 4.7 05/30/2013   NEUTROABS 3.1 05/30/2013   HGB 8.8* 05/30/2013   HCT 26.4* 05/30/2013   MCV 111.4*  05/30/2013   PLT 280 05/30/2013      Chemistry      Component Value Date/Time   NA 140 05/23/2013 1237   NA 137 04/15/2013 1346   K 4.3 05/23/2013 1237   K 4.4 04/15/2013 1346   CL 106 04/15/2013 1346   CL 107 09/19/2012 1325   CO2 21* 05/23/2013 1237   CO2 21 04/15/2013 1346   BUN 19.5 05/23/2013 1237   BUN 19 04/15/2013 1346   CREATININE 0.9 05/23/2013 1237   CREATININE 0.80 04/15/2013 1346      Component Value Date/Time   CALCIUM 8.8 05/23/2013 1237   CALCIUM 8.8 04/15/2013 1346   ALKPHOS 175* 05/17/2013 1138   ALKPHOS 76 02/11/2013 0415   AST 11 05/17/2013 1138  AST 21 02/11/2013 0415   ALT 7 05/17/2013 1138   ALT 20 02/11/2013 0415   BILITOT 0.23 05/17/2013 1138   BILITOT 0.3 02/11/2013 0415     ASSESSMENT & PLAN:  #1 low-grade myelodysplastic syndrome The patient was receiving erythropoietin stimulating agents up until recently. Due to recent diagnosis of head and neck cancer, I would discontinue erythropoietin stimulating agents due to risk of promoting cancer growth.  I plan to keep watching his hemoglobin to keep a closer to the 9 g range to improve outcome with radiation therapy for epiglottic cancer. He does not require blood transfusion today #2 epiglottic cancer T1, N0, M0 He is experiencing multiple side effects. We'll continue to provide supportive care For throat pain, I recommend he continue to take pain medicine as needed #3 weight loss Attempted to contact our nutritionists to followup with the patient. All questions were answered. The patient knows to call the clinic with any problems, questions or concerns. No barriers to learning was detected. I spent 15 minutes counseling the patient face to face. The total time spent in the appointment was 20 minutes and more than 50% was on counseling and review of test results     [facility], [place], MD 05/30/2013 10:45 PM

## 2013-05-30 NOTE — Telephone Encounter (Signed)
gave pt appt for lab and MD for February 2015

## 2013-05-30 NOTE — Progress Notes (Signed)
Received a call to speak with patient about beginning tube feedings.  My scheduled did not allow me to visit with patient in person today.  I did speak with him on the phone and encouraged patient to consume Ensure Plus 4 times a day by mouth. Patient reports he should have no trouble drinking Ensure Plus in addition to other liquids and soft foods.  Weight is relatively stable and was documented 117.1 pounds on January 22 from 117.7 pounds January 9.  Patient's usual body weight is 122 pounds.  I called patient but was not able to speak with him.  I left a message for him to call me.  Recommended if patient is unable to drink Ensure Plus 4 times a day by mouth , that he administer one can of Ensure Plus via feeding tube with 120 mL free water before and after each bolus feeding.  4 hours apart.  4 cans of Ensure Plus a day.  Will provide 1400 calories, 52 g protein, 1680 mL free water.  I will communicate with nurse navigator my recommendations  In case patient has questions.

## 2013-05-31 ENCOUNTER — Ambulatory Visit
Admission: RE | Admit: 2013-05-31 | Discharge: 2013-05-31 | Disposition: A | Discharge status: Home / Self Care | Payer: BC Managed Care – PPO | Source: Ambulatory Visit | Attending: Radiation Oncology | Admitting: Radiation Oncology

## 2013-06-03 ENCOUNTER — Ambulatory Visit
Admission: RE | Admit: 2013-06-03 | Discharge: 2013-06-03 | Disposition: A | Discharge status: Home / Self Care | Payer: BC Managed Care – PPO | Source: Ambulatory Visit | Attending: Radiation Oncology | Admitting: Radiation Oncology

## 2013-06-04 ENCOUNTER — Ambulatory Visit
Admission: RE | Admit: 2013-06-04 | Discharge: 2013-06-04 | Disposition: A | Discharge status: Home / Self Care | Payer: BC Managed Care – PPO | Source: Ambulatory Visit | Attending: Radiation Oncology | Admitting: Radiation Oncology

## 2013-06-04 VITALS — BP 127/78 | HR 96 | Temp 98.0°F | Ht 72.0 in | Wt 122.7 lb

## 2013-06-04 DIAGNOSIS — C321 Malignant neoplasm of supraglottis: Secondary | ICD-10-CM

## 2013-06-04 NOTE — Progress Notes (Signed)
Adrian [name] has had 17 fractions to his epiglottis.  He is having sore throat pain that he is rating at a 10/10.  He has a prescription for percocet that he is going to pick up today.  He does get "choked up on saliva" occasionally.  He denies having problems eating.  He is not using his peg tube yet.  His weight is stable today.  His oral mucosa is intact.  He has a yellow coating on the back of his tongue. He denies shortness of breath, dry mouth and fatigue.  His skin is intact and he is using biafine cream.

## 2013-06-04 NOTE — Progress Notes (Signed)
[place]     Rexene Edison, M.D. [place], [place] [address]               Blair Promise, M.D., Ph.D. Phone: [phone]      [place] A. Tammi Klippel, M.D. Fax: [phone]      Jodelle Gross, M.D., Ph.D.         Thea Silversmith, M.D.         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  Current Dose: 34 Gy  Current Fraction: 17  Planned Dose: 70 Gy  Narrative: Adrian [name] was seen today for weekly treatment management. The chart was checked and MVCT  were reviewed. For the past few days he has developed a significant sore throat. He was given a prescription for Percocet by medical oncology will start this medication today. He denies any breathing problems or itching in the neck region  Review of patient's allergies indicates no known allergies. Current Outpatient Prescriptions  Medication Sig Dispense Refill  . cyanocobalamin 500 MCG tablet Take 500 mcg by mouth daily.      . diazepam (VALIUM) 5 MG tablet Take 5 mg by mouth every 6 (six) hours as needed for anxiety.      Marland Kitchen emollient (BIAFINE) cream Apply 1 application topically daily. Apply to affected skin area on neck after rad txs when skin becomes irritated ot itching,dryness daily      . folic acid (FOLVITE) 1 MG tablet Place 1 tablet (1 mg total) into feeding tube daily.      . mometasone (NASONEX) 50 MCG/ACT nasal spray Place 2 sprays into both nostrils daily.      . phenytoin (DILANTIN) 100 MG ER capsule Take 200 mg by mouth 2 (two) times daily.      . vitamin E 1000 UNIT capsule Take 500 Units by mouth daily.      . Darbepoetin Alfa-Polysorbate (ARANESP, ALB FREE, SURECLICK IJ) Inject 0.2 mLs as  directed every 21 ( twenty-one) days. Aranesp 500 MCG/ML SOLN   Active      . Fe Fum-Vit C-Vit B12-FA (HEMATOGEN FORTE PO) Take 1 capsule by mouth daily.      Marland Kitchen nystatin (MYCOSTATIN) 100000 UNIT/ML suspension Use as directed 5 mLs in the mouth or throat 4 (four) times daily.      Marland Kitchen oxyCODONE-acetaminophen (PERCOCET) 5-325 MG per tablet Take 1 tablet by mouth every 6 (six) hours as needed for severe pain.  40 tablet  0   No current facility-administered medications for this encounter.   Labs:  Lab Results  Component Value Date   WBC 4.7 05/30/2013   HGB 8.8* 05/30/2013   HCT 26.4* 05/30/2013   MCV 111.4* 05/30/2013   PLT 280 05/30/2013   Lab Results  Component Value Date   CREATININE 0.9 05/23/2013   BUN 19.5 05/23/2013   NA 140 05/23/2013   K 4.3 05/23/2013   CL 106 04/15/2013   CO2 21* 05/23/2013   Lab Results  Component Value Date   ALT 7 05/17/2013   AST 11 05/17/2013   PHOS 4.0 02/01/2013  BILITOT 0.23 05/17/2013    Physical Examination:  Filed Vitals:   06/04/13 1222  BP: 127/78  Pulse: 96  Temp: 98 F (36.7 C)    Wt Readings from Last 3 Encounters:  06/04/13 122 lb 11.2 oz (55.656 kg)  05/30/13 117 lb 1.6 oz (53.116 kg)  05/28/13 120 lb 9.6 oz (54.704 kg)     Lungs - Normal respiratory effort, chest expands symmetrically. Lungs are clear to auscultation, no crackles or wheezes.  Heart has regular rhythm and rate  Abdomen is soft and non tender with normal bowel sounds  Assessment:  Patient tolerating treatments well except for above issues  Plan: Continue treatment per original radiation prescription

## 2013-06-05 ENCOUNTER — Ambulatory Visit
Admission: RE | Admit: 2013-06-05 | Discharge: 2013-06-05 | Disposition: A | Discharge status: Home / Self Care | Payer: BC Managed Care – PPO | Source: Ambulatory Visit | Attending: Radiation Oncology | Admitting: Radiation Oncology

## 2013-06-06 ENCOUNTER — Ambulatory Visit
Admission: RE | Admit: 2013-06-06 | Discharge: 2013-06-06 | Disposition: A | Discharge status: Home / Self Care | Payer: BC Managed Care – PPO | Source: Ambulatory Visit | Attending: Radiation Oncology | Admitting: Radiation Oncology

## 2013-06-07 ENCOUNTER — Ambulatory Visit
Admission: RE | Admit: 2013-06-07 | Discharge: 2013-06-07 | Disposition: A | Discharge status: Home / Self Care | Payer: BC Managed Care – PPO | Source: Ambulatory Visit | Attending: Radiation Oncology | Admitting: Radiation Oncology

## 2013-06-07 ENCOUNTER — Encounter: Payer: Self-pay | Admitting: *Deleted

## 2013-06-07 NOTE — Progress Notes (Signed)
Met with patient briefly after his scheduled RT.  Indicated he is experiencing increased throat pain and is finding relief with medication.  In response to my inquiry, he indicated he was not applying Biafine; I encouraged him to apply BID, suggesting after RT and then again before bed.  He verbalized understanding.  Initiating navigation as L2 (treatments established) patient with this encounter.  Gayleen Orem, RN, BSN, [facility] On[facility] Navigator [phone]

## 2013-06-10 ENCOUNTER — Ambulatory Visit
Admission: RE | Admit: 2013-06-10 | Discharge: 2013-06-10 | Disposition: A | Discharge status: Home / Self Care | Payer: BC Managed Care – PPO | Source: Ambulatory Visit | Attending: Radiation Oncology | Admitting: Radiation Oncology

## 2013-06-11 ENCOUNTER — Encounter: Payer: Self-pay | Admitting: Radiation Oncology

## 2013-06-11 ENCOUNTER — Ambulatory Visit
Admission: RE | Admit: 2013-06-11 | Discharge: 2013-06-11 | Disposition: A | Discharge status: Home / Self Care | Payer: BC Managed Care – PPO | Source: Ambulatory Visit | Attending: Radiation Oncology | Admitting: Radiation Oncology

## 2013-06-11 VITALS — BP 111/67 | HR 97 | Temp 98.2°F | Ht 72.0 in | Wt 118.3 lb

## 2013-06-11 DIAGNOSIS — C321 Malignant neoplasm of supraglottis: Secondary | ICD-10-CM

## 2013-06-11 NOTE — Progress Notes (Signed)
Adrian [name] has received 22 fractions to his neck for epiglottic cancer.  He c/o sore throat and grades this pain as a level 8 on a scale of 0-10.  He also c/o thickened saliva and was instructed to rinse with 1 part baking soda /salt as frequently as needed.  He admits to fatigue and "sometimes' takes naps during the day.  He is eating a soft diet of oatmeal, grits, eggs, pancakes, soups, ensure ( once daily).  Encouraged to drink at least 3 ensure daily and given advice on adding cheese to his grits and eggs to add more protein to his intake.  His Peg tube is intact, but note scab under the flange.  No evidence of bleeding at site.  Flushing PEG daily as instructed without any difficulty.

## 2013-06-11 NOTE — Progress Notes (Signed)
[place]     Rexene Edison, M.D. [place], [place] [address]               Blair Promise, M.D., Ph.D. Phone: [phone]      [place] A. Tammi Klippel, M.D. Fax: [phone]      Jodelle Gross, M.D., Ph.D.         Thea Silversmith, M.D.         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  Current Dose: 44 Gy  Current Fraction: 22  Planned Dose: 70 Gy  Narrative: Adrian [name] was seen today for weekly treatment management. The chart was checked and MVCT  were reviewed. He does have a sore throat but is able to swallow soft foods at this time. He also complains of thick tenacious sputum and will gargle with combination of salt and baking soda.  Weight is up a pound over the past 2 weeks  Review of patient's allergies indicates no known allergies. Current Outpatient Prescriptions  Medication Sig Dispense Refill  . diazepam (VALIUM) 5 MG tablet Take 5 mg by mouth every 6 (six) hours as needed for anxiety.      Marland Kitchen emollient (BIAFINE) cream Apply 1 application topically daily. Apply to affected skin area on neck after rad txs when skin becomes irritated ot itching,dryness daily      . Fe Fum-Vit C-Vit B12-FA (HEMATOGEN FORTE PO) Take 1 capsule by mouth daily.      . folic acid (FOLVITE) 1 MG tablet Place 1 tablet (1 mg total) into feeding tube daily.      . mometasone (NASONEX) 50 MCG/ACT nasal spray Place 2 sprays into both nostrils daily.      Marland Kitchen oxyCODONE-acetaminophen (PERCOCET) 5-325 MG per tablet Take 1 tablet by mouth every 6 (six) hours as needed for severe pain.  40 tablet  0  . phenytoin (DILANTIN) 100 MG ER capsule Take 200 mg by mouth 2 (two) times daily.       . vitamin E 1000 UNIT capsule Take 500 Units by mouth daily.      . cyanocobalamin 500 MCG tablet Take 500 mcg by mouth daily.      Marland Kitchen nystatin (MYCOSTATIN) 100000 UNIT/ML suspension Use as directed 5 mLs in the mouth or throat 4 (four) times daily.       No current facility-administered medications for this encounter.   Labs:  Lab Results  Component Value Date   WBC 4.7 05/30/2013   HGB 8.8* 05/30/2013   HCT 26.4* 05/30/2013   MCV 111.4* 05/30/2013   PLT 280 05/30/2013   Lab Results  Component Value Date   CREATININE 0.9 05/23/2013   BUN 19.5 05/23/2013   NA 140 05/23/2013   K 4.3 05/23/2013   CL 106 04/15/2013   CO2 21* 05/23/2013   Lab Results  Component Value Date   ALT 7 05/17/2013   AST 11 05/17/2013   PHOS 4.0 02/01/2013   BILITOT 0.23 05/17/2013    Physical Examination:  Filed Vitals:   06/11/13 1221  BP: 111/67  Pulse: 97  Temp: 98.2 F (36.8 C)    Wt Readings from Last 3 Encounters:  06/11/13 118 lb 4.8 oz (53.661 kg)  06/04/13 122 lb 11.2 oz (55.656 kg)  05/30/13 117 lb 1.6 oz (53.116 kg)    Some erythema noted to the posterior pharynx. No secondary infection in the oral cavity.   the skin of the neck area shows hyperpigmentation changes. Lungs - Normal respiratory effort, chest expands symmetrically. Lungs are clear to auscultation, no crackles or wheezes.  Heart has regular rhythm and rate  Abdomen is soft and non tender with normal bowel sounds  Assessment:  Patient tolerating treatments well  Plan: Continue treatment per original radiation prescription

## 2013-06-12 ENCOUNTER — Ambulatory Visit
Admission: RE | Admit: 2013-06-12 | Discharge: 2013-06-12 | Disposition: A | Discharge status: Home / Self Care | Payer: BC Managed Care – PPO | Source: Ambulatory Visit | Attending: Radiation Oncology | Admitting: Radiation Oncology

## 2013-06-13 ENCOUNTER — Other Ambulatory Visit (HOSPITAL_BASED_OUTPATIENT_CLINIC_OR_DEPARTMENT_OTHER): Payer: BC Managed Care – PPO

## 2013-06-13 ENCOUNTER — Telehealth: Payer: Self-pay | Admitting: Hematology and Oncology

## 2013-06-13 ENCOUNTER — Encounter: Payer: Self-pay | Admitting: Hematology and Oncology

## 2013-06-13 ENCOUNTER — Ambulatory Visit
Admission: RE | Admit: 2013-06-13 | Discharge: 2013-06-13 | Disposition: A | Discharge status: Home / Self Care | Payer: BC Managed Care – PPO | Source: Ambulatory Visit | Attending: Radiation Oncology | Admitting: Radiation Oncology

## 2013-06-13 ENCOUNTER — Ambulatory Visit (HOSPITAL_BASED_OUTPATIENT_CLINIC_OR_DEPARTMENT_OTHER): Payer: BC Managed Care – PPO | Admitting: Hematology and Oncology

## 2013-06-13 VITALS — BP 111/63 | HR 95 | Temp 96.8°F | Resp 18 | Ht 72.0 in | Wt 113.6 lb

## 2013-06-13 DIAGNOSIS — D46Z Other myelodysplastic syndromes: Secondary | ICD-10-CM

## 2013-06-13 DIAGNOSIS — D4620 Refractory anemia with excess of blasts, unspecified: Secondary | ICD-10-CM

## 2013-06-13 DIAGNOSIS — R070 Pain in throat: Secondary | ICD-10-CM

## 2013-06-13 DIAGNOSIS — R634 Abnormal weight loss: Secondary | ICD-10-CM

## 2013-06-13 DIAGNOSIS — D638 Anemia in other chronic diseases classified elsewhere: Secondary | ICD-10-CM

## 2013-06-13 DIAGNOSIS — C321 Malignant neoplasm of supraglottis: Secondary | ICD-10-CM

## 2013-06-13 HISTORY — DX: Pain in throat: R07.0

## 2013-06-13 LAB — CBC & DIFF AND RETIC
BASO%: 0.2 % (ref 0.0–2.0)
BASOS ABS: 0 10*3/uL (ref 0.0–0.1)
EOS%: 1.2 % (ref 0.0–7.0)
Eosinophils Absolute: 0.1 10*3/uL (ref 0.0–0.5)
HCT: 26.6 % — ABNORMAL LOW (ref 38.4–49.9)
HEMOGLOBIN: 8.7 g/dL — AB (ref 13.0–17.1)
Immature Retic Fract: 13 % — ABNORMAL HIGH (ref 3.00–10.60)
LYMPH%: 16.6 % (ref 14.0–49.0)
MCH: 37.2 pg — ABNORMAL HIGH (ref 27.2–33.4)
MCHC: 32.7 g/dL (ref 32.0–36.0)
MCV: 113.7 fL — AB (ref 79.3–98.0)
MONO#: 0.6 10*3/uL (ref 0.1–0.9)
MONO%: 14.7 % — AB (ref 0.0–14.0)
NEUT#: 2.9 10*3/uL (ref 1.5–6.5)
NEUT%: 67.3 % (ref 39.0–75.0)
PLATELETS: 295 10*3/uL (ref 140–400)
RBC: 2.34 10*6/uL — ABNORMAL LOW (ref 4.20–5.82)
RDW: 19 % — ABNORMAL HIGH (ref 11.0–14.6)
Retic %: 1.03 % (ref 0.80–1.80)
Retic Ct Abs: 24.1 10*3/uL — ABNORMAL LOW (ref 34.80–93.90)
WBC: 4.3 10*3/uL (ref 4.0–10.3)
lymph#: 0.7 10*3/uL — ABNORMAL LOW (ref 0.9–3.3)
nRBC: 0 % (ref 0–0)

## 2013-06-13 LAB — HOLD TUBE, BLOOD BANK

## 2013-06-13 MED ORDER — FENTANYL 25 MCG/HR TD PT72: 25.0000 ug | MEDICATED_PATCH | TRANSDERMAL | Status: DC

## 2013-06-13 NOTE — Telephone Encounter (Signed)
Gave pt appt for lab and MD on February 2015

## 2013-06-13 NOTE — Progress Notes (Signed)
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  SUMMARY OF ONCOLOGIC HISTORY: Oncology History   MDS, R-IPSS score of 3, low risk (Hg 8.9, +16 chromosome on BM, 2% blast count)   Squamous cell carcinoma of the epiglottis   Primary site: Larynx - Supraglottis (Left)   Staging method: AJCC 7th Edition   Clinical: Stage I (T1, N0, M0) signed by [name], MD on 04/30/2013 12:49 PM   Pathologic: Stage I (T1, N0, cM0) signed by [name], MD on 04/30/2013 12:49 PM   Summary: Stage I (T1, N0, cM0)       MDS (myelodysplastic syndrome), low grade   02/01/2007 Bone Marrow Biopsy BM biopsy was abnormal, overall probable low grade MDS   03/23/2011 - 02/06/2013 Chemotherapy [name], discontinued due to diagnosis of laryngeal ca    Squamous cell carcinoma of the epiglottis   03/06/2013 Procedure Biopsy from epiglottic region showed invasive [facility]   04/25/2013 Imaging Ct scan showed no other involvement in the LN    INTERVAL HISTORY: Adrian [name] 63 y.o. male returns for further followup. The patient has lost some weight. He had significant worsening sore throat. He was taking the prescribed pain medicine every 6 hours but still has a lot of pain. He continue have difficulty swallowing food but is not using his feeding tube. The patient denies any mouth sores, nausea, vomiting or change in bowel habits He denies any recent fever, chills, night sweats or abnormal weight loss    I have reviewed the past medical history, past surgical history, social history and family history with the patient and they are unchanged from previous note.  ALLERGIES:  has No Known Allergies.  MEDICATIONS:  Current  Outpatient Prescriptions  Medication Sig Dispense Refill  . cyanocobalamin 500 MCG tablet Take 500 mcg by mouth daily.      . diazepam (VALIUM) 5 MG tablet Take 5 mg by mouth every 6 (six) hours as needed for anxiety.      Marland Kitchen emollient (BIAFINE) cream Apply 1 application topically daily. Apply to affected skin area on neck after rad txs when skin becomes irritated ot itching,dryness daily      . Fe Fum-Vit C-Vit B12-FA (HEMATOGEN FORTE PO) Take 1 capsule by mouth daily.      . folic acid (FOLVITE) 1 MG tablet Place 1 tablet (1 mg total) into feeding tube daily.      . mometasone (NASONEX) 50 MCG/ACT nasal spray Place 2 sprays into both nostrils daily.      Marland Kitchen oxyCODONE-acetaminophen (PERCOCET) 5-325 MG per tablet Take 1 tablet by mouth every 6 (six) hours as needed for severe pain.  40 tablet  0  . phenytoin (DILANTIN) 100 MG ER capsule Take 200 mg by mouth 2 (two) times daily.      . vitamin E 1000 UNIT capsule Take 500 Units by mouth daily.      . fentaNYL (DURAGESIC - DOSED MCG/HR) 25 MCG/HR patch Place 1 patch (25 mcg total) onto the skin every 3 (three) days.  5 patch  0   No current facility-administered medications for this visit.    REVIEW OF SYSTEMS:   Constitutional: Denies fevers, chills  Eyes: Denies blurriness of vision Respiratory: Denies cough, dyspnea or  wheezes Cardiovascular: Denies palpitation, chest discomfort or lower extremity swelling Gastrointestinal:  Denies nausea, heartburn or change in bowel habits Skin: Denies abnormal skin rashes Lymphatics: Denies new lymphadenopathy or easy bruising Neurological:Denies numbness, tingling or new weaknesses Behavioral/Psych: Mood is stable, no new changes  All other systems were reviewed with the patient and are negative.  PHYSICAL EXAMINATION: ECOG PERFORMANCE STATUS: 1 - Symptomatic but completely ambulatory  Filed Vitals:   06/13/13 1241  BP: 111/63  Pulse: 95  Temp: 96.8 F (36 C)  Resp: 18   Filed Weights    06/13/13 1241  Weight: 113 lb 9.6 oz (51.529 kg)    GENERAL:alert, no distress and comfortable. He looks thin and cachectic SKIN: skin color, texture, turgor are normal, no rashes or significant lesions EYES: normal, Conjunctiva are pink and non-injected, sclera clear OROPHARYNX:no exudate, no erythema and lips, buccal mucosa, and tongue normal . No ulceration or thrush NECK: supple, thyroid normal size, non-tender, without nodularity LYMPH:  no palpable lymphadenopathy in the cervical, axillary or inguinal LUNGS: clear to auscultation and percussion with normal breathing effort HEART: regular rate & rhythm and no murmurs and no lower extremity edema ABDOMEN:abdomen soft, non-tender and normal bowel sounds. Feeding tube site looks okay Musculoskeletal:no cyanosis of digits and no clubbing  NEURO: alert & oriented x 3 with fluent speech, no focal motor/sensory deficits  LABORATORY DATA:  I have reviewed the data as listed    Component Value Date/Time   NA 140 05/23/2013 1237   NA 137 04/15/2013 1346   K 4.3 05/23/2013 1237   K 4.4 04/15/2013 1346   CL 106 04/15/2013 1346   CL 107 09/19/2012 1325   CO2 21* 05/23/2013 1237   CO2 21 04/15/2013 1346   GLUCOSE 112 05/23/2013 1237   GLUCOSE 89 04/15/2013 1346   GLUCOSE 97 09/19/2012 1325   BUN 19.5 05/23/2013 1237   BUN 19 04/15/2013 1346   CREATININE 0.9 05/23/2013 1237   CREATININE 0.80 04/15/2013 1346   CALCIUM 8.8 05/23/2013 1237   CALCIUM 8.8 04/15/2013 1346   PROT 8.4* 05/17/2013 1138   PROT 8.2 02/11/2013 0415   ALBUMIN 3.2* 05/17/2013 1138   ALBUMIN 1.8* 02/11/2013 0415   AST 11 05/17/2013 1138   AST 21 02/11/2013 0415   ALT 7 05/17/2013 1138   ALT 20 02/11/2013 0415   ALKPHOS 175* 05/17/2013 1138   ALKPHOS 76 02/11/2013 0415   BILITOT 0.23 05/17/2013 1138   BILITOT 0.3 02/11/2013 0415   GFRNONAA >90 04/15/2013 1346   GFRAA >90 04/15/2013 1346    No results found for this basename: SPEP,  UPEP,   kappa and lambda light chains    Lab Results   Component Value Date   WBC 4.3 06/13/2013   NEUTROABS 2.9 06/13/2013   HGB 8.7* 06/13/2013   HCT 26.6* 06/13/2013   MCV 113.7* 06/13/2013   PLT 295 06/13/2013      Chemistry      Component Value Date/Time   NA 140 05/23/2013 1237   NA 137 04/15/2013 1346   K 4.3 05/23/2013 1237   K 4.4 04/15/2013 1346   CL 106 04/15/2013 1346   CL 107 09/19/2012 1325   CO2 21* 05/23/2013 1237   CO2 21 04/15/2013 1346   BUN 19.5 05/23/2013 1237   BUN 19 04/15/2013 1346   CREATININE 0.9 05/23/2013 1237   CREATININE 0.80 04/15/2013 1346      Component Value Date/Time   CALCIUM 8.8 05/23/2013 1237   CALCIUM 8.8 04/15/2013  1346   ALKPHOS 175* 05/17/2013 1138   ALKPHOS 76 02/11/2013 0415   AST 11 05/17/2013 1138   AST 21 02/11/2013 0415   ALT 7 05/17/2013 1138   ALT 20 02/11/2013 0415   BILITOT 0.23 05/17/2013 1138   BILITOT 0.3 02/11/2013 0415     ASSESSMENT & PLAN:  #1 low-grade myelodysplastic syndrome The patient was receiving erythropoietin stimulating agents up until recently. Due to recent diagnosis of head and neck cancer, I would discontinue erythropoietin stimulating agents due to risk of promoting cancer growth.  I plan to keep watching his hemoglobin to keep a closer to the 9 g range to improve outcome with radiation therapy for epiglottic cancer. He does not require blood transfusion today #2 epiglottic cancer T1, N0, M0 He is experiencing multiple side effects. We'll continue to provide supportive care #3 severe throat pain He is currently on Percocet and it is not controlling his pain. I recommend the addition of fentanyl patch 25 mcg along with breakthrough pain medicine as needed. I want him about the side effects of pain medicine including nausea and constipation I will see him back in 2 weeks to reassess his pain management #4 weight loss I will refer him to see one of our dietitian for management. I encouraged him to increase his Ensure to 6 cans a day and to use his feeding tube if he is not able to  swallow   Orders Placed This Encounter  Procedures  . Ambulatory referral to Nutrition and Diabetic Education    Referral Priority:  Routine    Referral Type:  Consultation    Referral Reason:  Specialty Services Required    Referred to Provider:  Karie Mainland, RD    Number of Visits Requested:  1   All questions were answered. The patient knows to call the clinic with any problems, questions or concerns. No barriers to learning was detected. I spent 25 minutes counseling the patient face to face. The total time spent in the appointment was 40 minutes and more than 50% was on counseling and review of test results     [facility], [place], MD 06/13/2013 2:15 PM

## 2013-06-14 ENCOUNTER — Ambulatory Visit: Payer: BC Managed Care – PPO | Admitting: Nutrition

## 2013-06-14 ENCOUNTER — Ambulatory Visit
Admission: RE | Admit: 2013-06-14 | Discharge: 2013-06-14 | Disposition: A | Discharge status: Home / Self Care | Payer: BC Managed Care – PPO | Source: Ambulatory Visit | Attending: Radiation Oncology | Admitting: Radiation Oncology

## 2013-06-14 NOTE — Progress Notes (Signed)
Patient has had continued weight loss with weight documented as 113.6 pounds February 5.  This is decreased from 117.1 pounds January 22.  Patient has been consuming one to 2 Ensure Plus daily by mouth.  He is unable to eat very much food.  He continues to flush his feeding tube once daily with water but has not used nutrition through his tube.  Patient denies difficulty swallowing Ensure Plus.  He states he did not know that he should be drinking more.  Nutrition diagnosis: Inadequate oral intake continues.  Intervention: Patient educated to continue to flush feeding tube daily.  Patient given written instructions to consume Ensure Plus by mouth.  6 cans daily, one every 3 hours.  Patient verbalizes understanding.  Teach back method used.  He was also encouraged to continue oral intake as desired.  I explained if patient was unable to consume 6 cans of Ensure Plus by mouth he will need to begin using his feeding tube.  Monitoring, evaluation, goals: Patient was unable to increase calories and protein.  Nor did he increase oral nutrition supplements.  Patient has had continued weight loss.  Next visit: I will continue to work with patient as needed.  He has transportation issues, which limit his ability for followup.

## 2013-06-17 ENCOUNTER — Ambulatory Visit
Admission: RE | Admit: 2013-06-17 | Discharge: 2013-06-17 | Disposition: A | Discharge status: Home / Self Care | Payer: BC Managed Care – PPO | Source: Ambulatory Visit | Attending: Radiation Oncology | Admitting: Radiation Oncology

## 2013-06-17 ENCOUNTER — Encounter: Payer: Self-pay | Admitting: *Deleted

## 2013-06-17 NOTE — Progress Notes (Signed)
Met with patient after his scheduled RT.  He stated he is having some throat pain but is able to maintain comfort with prescribed pain medication.  He verified that he continues to flush PEG daily; continues to take hydration/nutrition orally.  He expressed no concerns needing follow-up.  Continuing to navigate as L2 (treatments established) patient.  Gayleen Orem, RN, BSN, [facility]ck Oncology Navigator [phone]

## 2013-06-18 ENCOUNTER — Ambulatory Visit
Admission: RE | Admit: 2013-06-18 | Discharge: 2013-06-18 | Disposition: A | Discharge status: Home / Self Care | Payer: BC Managed Care – PPO | Source: Ambulatory Visit | Attending: Radiation Oncology | Admitting: Radiation Oncology

## 2013-06-18 VITALS — BP 106/69 | HR 96 | Temp 98.5°F | Ht 72.0 in | Wt 116.6 lb

## 2013-06-18 DIAGNOSIS — C321 Malignant neoplasm of supraglottis: Secondary | ICD-10-CM

## 2013-06-18 NOTE — Progress Notes (Signed)
Adrian [name] has had 27 fractions to his epiglottis.  He is having sore throat pain that he rates at a 7/10.  He is taking percocet q 6 hours and also has a fentanyl patch on.  He is currently eating soft foods like eggs, applesauce and oatmeal. He drinks 4-6 ensures per day.  His voice sounds raspy.  He reports a cough but denies coughing up blood.  His weight is stable today.  He has not used his peg tube yet except for flushing it everyday.  His oral mucosa is intact.  He reports a dry mouth.  The skin on his neck is intact with hyperpigmentation.  He is using biafine cream.

## 2013-06-18 NOTE — Progress Notes (Signed)
  Radiation Oncology         [phone]) [phone] ________________________________  Name: Adrian [name] MRN: [id number]  Date: 06/18/2013  DOB: Feb 15, 1951  Weekly Radiation Therapy Management  Squamous cell carcinoma of the epiglottis   Primary site: Larynx - Supraglottis (Left)   Staging method: AJCC 7th Edition   Clinical: Stage I (T1, N0, M0)    Pathologic: Stage I (T1, N0, cM0)   Summary: Stage I (T1, N0, cM0)  Current Dose: 54 Gy     Planned Dose:  70 Gy  Narrative . . . . . . . . The patient presents for routine under treatment assessment.                                   The patient is without complaint except for a sore throat. He does take Percocet and has a Duragesic patch for this issue.  he however continues to be able to  swallow soft foods and liquids.  He has started using a nutritional supplement but is taking this in by mouth not through his PEG.                                 Set-up films were reviewed.                                 The chart was checked. Physical Findings. . .  height is 6' (1.829 m) and weight is 116 lb 9.6 oz (52.889 kg). His temperature is 98.5 F (36.9 C). His blood pressure is 106/69 and his pulse is 96. His oxygen saturation is 97%. . Weight essentially stable.  Hyperpigmentation changes and erythema along the anterior neck region. No skin breakdown is appreciated. The oral cavity is moist without secondary infection Impression . . . . . . . The patient is tolerating radiation. Plan . . . . . . . . . . . . Continue treatment as planned.  ________________________________  -----------------------------------  Blair Promise, PhD, MD

## 2013-06-19 ENCOUNTER — Ambulatory Visit
Admission: RE | Admit: 2013-06-19 | Discharge: 2013-06-19 | Disposition: A | Discharge status: Home / Self Care | Payer: BC Managed Care – PPO | Source: Ambulatory Visit | Attending: Radiation Oncology | Admitting: Radiation Oncology

## 2013-06-19 ENCOUNTER — Ambulatory Visit: Payer: BC Managed Care – PPO

## 2013-06-19 ENCOUNTER — Other Ambulatory Visit: Payer: BC Managed Care – PPO | Admitting: Lab

## 2013-06-20 ENCOUNTER — Ambulatory Visit
Admission: RE | Admit: 2013-06-20 | Discharge: 2013-06-20 | Disposition: A | Discharge status: Home / Self Care | Payer: BC Managed Care – PPO | Source: Ambulatory Visit | Attending: Radiation Oncology | Admitting: Radiation Oncology

## 2013-06-21 ENCOUNTER — Ambulatory Visit: Payer: BC Managed Care – PPO | Admitting: Nutrition

## 2013-06-21 ENCOUNTER — Ambulatory Visit
Admission: RE | Admit: 2013-06-21 | Discharge: 2013-06-21 | Disposition: A | Discharge status: Home / Self Care | Payer: BC Managed Care – PPO | Source: Ambulatory Visit | Attending: Radiation Oncology | Admitting: Radiation Oncology

## 2013-06-21 NOTE — Progress Notes (Signed)
Patient's wife contacted me by phone.  Patient is having difficulty swallowing and has been unable to to consume adequate calories by mouth.  Patient's wife is requesting help with nutrition support through his feeding tube.  Weight documented as 116.6 pounds February 10.  Estimated nutrition: 1775-1975 calories, 80-95 g protein, 2 point liters fluid.  Nutrition diagnosis: Inadequate oral intake continues.  Intervention: Patient's wife was educated to provide Ensure Plus, one can every 3 hours for total of 6 cans daily.  She is to flush feeding tube with 60 cc of free water before bolus feeding and 120 mL free water.  After bolus feeding.  Patient's wife was instructed on how to administer [facility] feeds.  I encouraged her to utilize room temperature were feedings and for to take about 20 minutes.  Patient's wife prefers [facility] for home health supplies.  Tube feedings: 6 cans of Ensure Plus will provide 2100 calories, 78 g protein, 2160 mL free water.  Monitoring, evaluation, goals: Patient will tolerate bolus tube feedings to minimize further weight loss.  Next visit: Tuesday, February 17 after radiation therapy.

## 2013-06-24 ENCOUNTER — Ambulatory Visit
Admission: RE | Admit: 2013-06-24 | Discharge: 2013-06-24 | Disposition: A | Discharge status: Home / Self Care | Payer: BC Managed Care – PPO | Source: Ambulatory Visit | Attending: Radiation Oncology | Admitting: Radiation Oncology

## 2013-06-25 ENCOUNTER — Ambulatory Visit
Admission: RE | Admit: 2013-06-25 | Discharge: 2013-06-25 | Disposition: A | Discharge status: Home / Self Care | Payer: BC Managed Care – PPO | Source: Ambulatory Visit | Attending: Radiation Oncology | Admitting: Radiation Oncology

## 2013-06-25 ENCOUNTER — Ambulatory Visit: Payer: BC Managed Care – PPO | Admitting: Nutrition

## 2013-06-25 VITALS — BP 94/71 | HR 114 | Temp 97.7°F | Ht 72.0 in | Wt 111.5 lb

## 2013-06-25 DIAGNOSIS — C321 Malignant neoplasm of supraglottis: Secondary | ICD-10-CM

## 2013-06-25 MED ORDER — OXYCODONE-ACETAMINOPHEN 5-325 MG PO TABS: 1.0000 | ORAL_TABLET | Freq: Four times a day (QID) | ORAL | Status: DC | PRN

## 2013-06-25 MED ORDER — FLUCONAZOLE 100 MG PO TABS: 100.0000 mg | ORAL_TABLET | Freq: Every day | ORAL | Status: DC

## 2013-06-25 NOTE — Progress Notes (Signed)
Micheal Mixson has had 32 fractions to his epiglottis.  He is having pain with swallowing that he is rating at a 8/10.  He is currently wearing a fentanyl 25 mcg patch which he says is not helping.  He is asking for something else for pain.  He is out of percocet. He reports that he is only able to swallow ensure.  He is trying to drink 4 cans per day.  He is not using his peg tube except to flush it daily.  He has lost 5 lbs since 06/18/13.  He has been talking to the dietician.  Orthostatic vitals done: sitting bp 106/56, hr 94, standing 94/71, hr 114.  He reports thick saliva and has to spit often.  The skin on his neck has hyperpigmentation and he has areas of peeling on the front of his nect and two small areas on the back of his neck.  He is using biafine BID.  He reports fatigue.

## 2013-06-25 NOTE — Progress Notes (Signed)
[place]     Adrian [name], M.D. [place], [place] [address]               Adrian [name], M.D., Ph.D. Phone: [phone]      Adrian [name], M.D. Fax: [phone]      Adrian [name], M.D., Ph.D.         Adrian [name], M.D.         Adrian [name], M.D Weekly Treatment Management Note  Name: Adrian [name]     MRN: [id number]        CSN: [id number] Date: 06/25/2013      DOB: 1950-08-27  CC: Adrian Huh, MD         [name]    Status: Outpatient  Diagnosis: The encounter diagnosis was Squamous cell carcinoma of the epiglottis.  Current Dose: 64 Gy  Current Fraction: 32  Planned Dose: 70 Gy  Narrative: Adrian [name] was seen today for weekly treatment management. The chart was checked and MVCT  were reviewed. He is complaining of a sore throat. He has run out of her Percocet.  I did refill his medication. He does show some orthostatic blood pressure changes and will increase his free water thus his PEG. The nutritionist met with him earlier this recommended increasing his feedings to 6 cans per day in light of his weight loss.  Review of patient's allergies indicates no known allergies. Current Outpatient Prescriptions  Medication Sig Dispense Refill  . cyanocobalamin 500 MCG tablet Take 500 mcg by mouth daily.      . diazepam (VALIUM) 5 MG tablet Take 5 mg by mouth every 6 (six) hours as needed for anxiety.      Marland Kitchen emollient (BIAFINE) cream Apply 1 application topically daily. Apply to affected skin area on neck after rad txs when skin becomes irritated ot itching,dryness daily      . Fe Fum-Vit C-Vit B12-FA (HEMATOGEN FORTE PO) Take 1 capsule by mouth daily.      . fentaNYL (DURAGESIC - DOSED MCG/HR) 25 MCG/HR patch Place 1 patch (25 mcg total) onto the skin every 3 (three) days.  5 patch  0  . folic acid (FOLVITE) 1 MG tablet Place 1 tablet (1 mg total) into feeding tube daily.      . mometasone  (NASONEX) 50 MCG/ACT nasal spray Place 2 sprays into both nostrils daily.      . phenytoin (DILANTIN) 100 MG ER capsule Take 200 mg by mouth 2 (two) times daily.      . vitamin E 1000 UNIT capsule Take 500 Units by mouth daily.      . fluconazole (DIFLUCAN) 100 MG tablet Take 1 tablet (100 mg total) by mouth daily.  8 tablet  0  . oxyCODONE-acetaminophen (PERCOCET) 5-325 MG per tablet Take 1 tablet by mouth every 6 (six) hours as needed for severe pain.  40 tablet  0   No current facility-administered medications for this encounter.   Labs:  Lab Results  Component Value Date   WBC 4.3 06/13/2013   HGB 8.7* 06/13/2013   HCT 26.6* 06/13/2013   MCV 113.7* 06/13/2013   PLT 295 06/13/2013   Lab Results  Component Value Date   CREATININE 0.9 05/23/2013   BUN 19.5 05/23/2013   NA 140 05/23/2013   K 4.3 05/23/2013   CL 106 04/15/2013   CO2 21* 05/23/2013   Lab Results  Component Value Date  ALT 7 05/17/2013   AST 11 05/17/2013   PHOS 4.0 02/01/2013   BILITOT 0.23 05/17/2013    Physical Examination:  Filed Vitals:   06/25/13 1201  BP: 94/71  Pulse: 114  Temp:     Wt Readings from Last 3 Encounters:  06/25/13 111 lb 8 oz (50.576 kg)  06/18/13 116 lb 9.6 oz (52.889 kg)  06/13/13 113 lb 9.6 oz (51.529 kg)    The neck area shows hyperpigmentation changes and dry desquamation but no moist desquamation. The oral cavity shows early candidiasis infection along the soft palate region Lungs - Normal respiratory effort, chest expands symmetrically. Lungs are clear to auscultation, no crackles or wheezes.  Heart has regular rhythm and rate  Abdomen is soft and non tender with normal bowel sounds  Assessment:  Patient tolerating treatments well except for issues as above  Plan: Continue treatment per original radiation prescription.  Refill on Percocet. Patient was also given a prescription for Diflucan.

## 2013-06-25 NOTE — Progress Notes (Signed)
Patient's wife presents for nutrition followup.  Patient is still in radiation oncology.  Patient's wife reports patient is not using feeding tube.  He has refused. He has begun to drink fluids and is consuming four Ensure Plus daily.  His weight has decreased to 111 pounds down from 116.6. February 10.  Patient is not eating any other foods.  Estimated nutrition needs: 1775-1975 calories, 80-95 g protein, 2 L fluid.  Nutrition diagnosis: Inadequate oral intake continues.  Intervention: Educated patient's wife.  The patient needs to increase Ensure Plus to goal rate of 6 cans daily.  If patient is able and desires to drink Ensure Plus, patient will need to drink one can every 3 hours.  Otherwise, patient needs to use his feeding tube.  Instructed patient's wife to return to radiation oncology and ask for nursing to give instructions on tube feedings.  I provided patient with his second complimentary case of Ensure Plus.  Tube feedings: 6 cans of Ensure Plus will provide 2100 calories, 78 g protein, 2160 mL free water.  Monitoring, evaluation, goals: Patient will tolerate either oral intake or bolus tube feedings at goal to minimize further weight loss.  Next visit: Continue to follow patient as needed either by phone or appointment.

## 2013-06-26 ENCOUNTER — Ambulatory Visit
Admission: RE | Admit: 2013-06-26 | Discharge: 2013-06-26 | Disposition: A | Discharge status: Home / Self Care | Payer: BC Managed Care – PPO | Source: Ambulatory Visit | Attending: Radiation Oncology | Admitting: Radiation Oncology

## 2013-06-27 ENCOUNTER — Ambulatory Visit (HOSPITAL_BASED_OUTPATIENT_CLINIC_OR_DEPARTMENT_OTHER): Payer: BC Managed Care – PPO | Admitting: Hematology and Oncology

## 2013-06-27 ENCOUNTER — Other Ambulatory Visit (HOSPITAL_BASED_OUTPATIENT_CLINIC_OR_DEPARTMENT_OTHER): Payer: BC Managed Care – PPO

## 2013-06-27 ENCOUNTER — Ambulatory Visit
Admission: RE | Admit: 2013-06-27 | Discharge: 2013-06-27 | Disposition: A | Discharge status: Home / Self Care | Payer: BC Managed Care – PPO | Source: Ambulatory Visit | Attending: Radiation Oncology | Admitting: Radiation Oncology

## 2013-06-27 ENCOUNTER — Telehealth: Payer: Self-pay | Admitting: Hematology and Oncology

## 2013-06-27 ENCOUNTER — Encounter: Payer: Self-pay | Admitting: *Deleted

## 2013-06-27 VITALS — BP 103/72 | HR 84 | Temp 97.7°F | Resp 18 | Ht 72.0 in | Wt 109.2 lb

## 2013-06-27 DIAGNOSIS — D4620 Refractory anemia with excess of blasts, unspecified: Secondary | ICD-10-CM

## 2013-06-27 DIAGNOSIS — C321 Malignant neoplasm of supraglottis: Secondary | ICD-10-CM

## 2013-06-27 DIAGNOSIS — R070 Pain in throat: Secondary | ICD-10-CM

## 2013-06-27 DIAGNOSIS — R634 Abnormal weight loss: Secondary | ICD-10-CM

## 2013-06-27 DIAGNOSIS — B370 Candidal stomatitis: Secondary | ICD-10-CM

## 2013-06-27 LAB — CBC & DIFF AND RETIC
BASO%: 0.6 % (ref 0.0–2.0)
BASOS ABS: 0 10*3/uL (ref 0.0–0.1)
EOS%: 4.3 % (ref 0.0–7.0)
Eosinophils Absolute: 0.2 10*3/uL (ref 0.0–0.5)
HEMATOCRIT: 27.5 % — AB (ref 38.4–49.9)
HEMOGLOBIN: 9 g/dL — AB (ref 13.0–17.1)
Immature Retic Fract: 6.4 % (ref 3.00–10.60)
LYMPH%: 10.3 % — AB (ref 14.0–49.0)
MCH: 37.5 pg — AB (ref 27.2–33.4)
MCHC: 32.7 g/dL (ref 32.0–36.0)
MCV: 114.6 fL — AB (ref 79.3–98.0)
MONO#: 0.7 10*3/uL (ref 0.1–0.9)
MONO%: 12.4 % (ref 0.0–14.0)
NEUT#: 3.9 10*3/uL (ref 1.5–6.5)
NEUT%: 72.4 % (ref 39.0–75.0)
PLATELETS: 312 10*3/uL (ref 140–400)
RBC: 2.4 10*6/uL — ABNORMAL LOW (ref 4.20–5.82)
RDW: 18.2 % — ABNORMAL HIGH (ref 11.0–14.6)
Retic %: 0.84 % (ref 0.80–1.80)
Retic Ct Abs: 20.16 10*3/uL — ABNORMAL LOW (ref 34.80–93.90)
WBC: 5.3 10*3/uL (ref 4.0–10.3)
lymph#: 0.6 10*3/uL — ABNORMAL LOW (ref 0.9–3.3)

## 2013-06-27 LAB — HOLD TUBE, BLOOD BANK

## 2013-06-27 MED ORDER — MORPHINE SULFATE ER 15 MG PO TBCR: 15.0000 mg | EXTENDED_RELEASE_TABLET | Freq: Two times a day (BID) | ORAL | Status: DC

## 2013-06-27 MED ORDER — BIAFINE EX EMUL
Freq: Two times a day (BID) | CUTANEOUS | Status: DC
Start: 2013-06-27 — End: 2013-06-28
  Administered 2013-06-27: 16:00:00 via TOPICAL

## 2013-06-27 NOTE — Telephone Encounter (Signed)
gv adn printed appt sched and avs for pt for March

## 2013-06-27 NOTE — Progress Notes (Signed)
[place] OFFICE PROGRESS NOTE  Patient Care Team: [name], MD as PCP - General (Family Medicine) [name], MD as Consulting Physician (Hematology and Oncology) Brooks Sailors, RN as Registered Nurse (Oncology)  DIAGNOSIS: Myelodysplastic syndrome, squamous cell carcinoma of the epiglottis  SUMMARY OF ONCOLOGIC HISTORY: Oncology History   MDS, R-IPSS score of 3, low risk (Hg 8.9, +16 chromosome on BM, 2% blast count)   Squamous cell carcinoma of the epiglottis   Primary site: Larynx - Supraglottis (Left)   Staging method: AJCC 7th Edition   Clinical: Stage I (T1, N0, M0) signed by [name], MD on 04/30/2013 12:49 PM   Pathologic: Stage I (T1, N0, cM0) signed by [name], MD on 04/30/2013 12:49 PM   Summary: Stage I (T1, N0, cM0)       MDS (myelodysplastic syndrome), low grade   02/01/2007 Bone Marrow Biopsy BM biopsy was abnormal, overall probable low grade MDS   03/23/2011 - 02/06/2013 Chemotherapy [name], discontinued due to diagnosis of laryngeal ca    Squamous cell carcinoma of the epiglottis   03/06/2013 Procedure Biopsy from epiglottic region showed invasive [facility]   04/25/2013 Imaging Ct scan showed no other involvement in the LN    INTERVAL HISTORY: Adrian [name] 63 y.o. male returns for further followup. He has severe throat pain. The fentanyl patch was not working well and he is only taking Percocet as needed. He is rating his throat pain and 9/10 pain. 2 days ago, he was seen by the radiation oncologist were noted to have thrush. He is taking fluconazole. The patient denies any mouth sores, nausea, vomiting or change in bowel habits He denies any recent fever, chills, night sweats or abnormal weight loss   I have reviewed the past medical history, past surgical history, social history and family history with the patient and they are unchanged from previous note.  ALLERGIES:  has No Known  Allergies.  MEDICATIONS:  Current Outpatient Prescriptions  Medication Sig Dispense Refill  . cyanocobalamin 500 MCG tablet Take 500 mcg by mouth daily.      . diazepam (VALIUM) 5 MG tablet Take 5 mg by mouth every 6 (six) hours as needed for anxiety.      Marland Kitchen emollient (BIAFINE) cream Apply 1 application topically daily. Apply to affected skin area on neck after rad txs when skin becomes irritated ot itching,dryness daily      . Fe Fum-Vit C-Vit B12-FA (HEMATOGEN FORTE PO) Take 1 capsule by mouth daily.      . fentaNYL (DURAGESIC - DOSED MCG/HR) 25 MCG/HR patch Place 1 patch (25 mcg total) onto the skin every 3 (three) days.  5 patch  0  . fluconazole (DIFLUCAN) 100 MG tablet Take 1 tablet (100 mg total) by mouth daily.  8 tablet  0  . folic acid (FOLVITE) 1 MG tablet Place 1 tablet (1 mg total) into feeding tube daily.      . mometasone (NASONEX) 50 MCG/ACT nasal spray Place 2 sprays into both nostrils daily.      Marland Kitchen oxyCODONE-acetaminophen (PERCOCET) 5-325 MG per tablet Take 1 tablet by mouth every 6 (six) hours as needed for severe pain.  40 tablet  0  . phenytoin (DILANTIN) 100 MG ER capsule Take 200 mg by mouth 2 (two) times daily.      . vitamin E 1000 UNIT capsule Take 500 Units by mouth daily.      Marland Kitchen morphine (MS CONTIN) 15 MG 12 hr  tablet Take 1 tablet (15 mg total) by mouth every 12 (twelve) hours.  60 tablet  0   No current facility-administered medications for this visit.    REVIEW OF SYSTEMS:   Constitutional: Denies fevers, chills or abnormal weight loss Eyes: Denies blurriness of vision Respiratory: Denies cough, dyspnea or wheezes Cardiovascular: Denies palpitation, chest discomfort or lower extremity swelling Gastrointestinal:  Denies nausea, heartburn or change in bowel habits Skin: Denies abnormal skin rashes Lymphatics: Denies new lymphadenopathy or easy bruising Neurological:Denies numbness, tingling or new weaknesses Behavioral/Psych: Mood is stable, no new changes   All other systems were reviewed with the patient and are negative.  PHYSICAL EXAMINATION: ECOG PERFORMANCE STATUS: 1 - Symptomatic but completely ambulatory  Filed Vitals:   06/27/13 1243  BP: 103/72  Pulse: 84  Temp: 97.7 F (36.5 C)  Resp: 18   Filed Weights   06/27/13 1243  Weight: 109 lb 3.2 oz (49.533 kg)    GENERAL:alert, no distress and comfortable. He looks thin and cachectic SKIN: No radiation-induced skin injury around his neck EYES: normal, Conjunctiva are pink and non-injected, sclera clear OROPHARYNX:no exudate, noted erythema at the back of his throat with white patches consistent with thrush  NECK: supple, thyroid normal size, non-tender, without nodularity LYMPH:  no palpable lymphadenopathy in the cervical, axillary or inguinal LUNGS: clear to auscultation and percussion with normal breathing effort HEART: regular rate & rhythm and no murmurs and no lower extremity edema ABDOMEN:abdomen soft, non-tender and normal bowel sounds Musculoskeletal:no cyanosis of digits and no clubbing  NEURO: alert & oriented x 3 with fluent speech, no focal motor/sensory deficits  LABORATORY DATA:  I have reviewed the data as listed    Component Value Date/Time   NA 140 05/23/2013 1237   NA 137 04/15/2013 1346   K 4.3 05/23/2013 1237   K 4.4 04/15/2013 1346   CL 106 04/15/2013 1346   CL 107 09/19/2012 1325   CO2 21* 05/23/2013 1237   CO2 21 04/15/2013 1346   GLUCOSE 112 05/23/2013 1237   GLUCOSE 89 04/15/2013 1346   GLUCOSE 97 09/19/2012 1325   BUN 19.5 05/23/2013 1237   BUN 19 04/15/2013 1346   CREATININE 0.9 05/23/2013 1237   CREATININE 0.80 04/15/2013 1346   CALCIUM 8.8 05/23/2013 1237   CALCIUM 8.8 04/15/2013 1346   PROT 8.4* 05/17/2013 1138   PROT 8.2 02/11/2013 0415   ALBUMIN 3.2* 05/17/2013 1138   ALBUMIN 1.8* 02/11/2013 0415   AST 11 05/17/2013 1138   AST 21 02/11/2013 0415   ALT 7 05/17/2013 1138   ALT 20 02/11/2013 0415   ALKPHOS 175* 05/17/2013 1138   ALKPHOS 76 02/11/2013 0415    BILITOT 0.23 05/17/2013 1138   BILITOT 0.3 02/11/2013 0415   GFRNONAA >90 04/15/2013 1346   GFRAA >90 04/15/2013 1346    No results found for this basename: SPEP,  UPEP,   kappa and lambda light chains    Lab Results  Component Value Date   WBC 5.3 06/27/2013   NEUTROABS 3.9 06/27/2013   HGB 9.0* 06/27/2013   HCT 27.5* 06/27/2013   MCV 114.6* 06/27/2013   PLT 312 06/27/2013      Chemistry      Component Value Date/Time   NA 140 05/23/2013 1237   NA 137 04/15/2013 1346   K 4.3 05/23/2013 1237   K 4.4 04/15/2013 1346   CL 106 04/15/2013 1346   CL 107 09/19/2012 1325   CO2 21* 05/23/2013 1237   CO2 21  04/15/2013 1346   BUN 19.5 05/23/2013 1237   BUN 19 04/15/2013 1346   CREATININE 0.9 05/23/2013 1237   CREATININE 0.80 04/15/2013 1346      Component Value Date/Time   CALCIUM 8.8 05/23/2013 1237   CALCIUM 8.8 04/15/2013 1346   ALKPHOS 175* 05/17/2013 1138   ALKPHOS 76 02/11/2013 0415   AST 11 05/17/2013 1138   AST 21 02/11/2013 0415   ALT 7 05/17/2013 1138   ALT 20 02/11/2013 0415   BILITOT 0.23 05/17/2013 1138   BILITOT 0.3 02/11/2013 0415     ASSESSMENT & PLAN:  #1 low-grade myelodysplastic syndrome The patient was receiving erythropoietin stimulating agents up until recently. Due to recent diagnosis of head and neck cancer, I would discontinue erythropoietin stimulating agents due to risk of promoting cancer growth.  I plan to keep watching his hemoglobin to keep a closer to the 9 g range to improve outcome with radiation therapy for epiglottic cancer. He does not require blood transfusion today #2 epiglottic cancer T1, N0, M0 He is experiencing multiple side effects. We'll continue to provide supportive care #3 severe throat pain He is currently on Percocet and it is not controlling his pain. Recently, at the fentanyl patch and it was not adequate. Today, I plan to start him on MS Contin 15 mg twice a day. I warned him about the side effects of pain medicine including nausea and  constipation I will see him back in 2 weeks to reassess his pain management #4 weight loss I will refer him to see one of our dietitian for management. I encouraged him to increase his Ensure to 6 cans a day and to use his feeding tube if he is not able to swallow   All questions were answered. The patient knows to call the clinic with any problems, questions or concerns. No barriers to learning was detected. I spent 25 minutes counseling the patient face to face. The total time spent in the appointment was 40 minutes and more than 50% was on counseling and review of test results     [facility], [place], MD 06/27/2013 12:59 PM

## 2013-06-28 ENCOUNTER — Encounter: Payer: Self-pay | Admitting: *Deleted

## 2013-06-28 ENCOUNTER — Ambulatory Visit
Admission: RE | Admit: 2013-06-28 | Discharge: 2013-06-28 | Disposition: A | Discharge status: Home / Self Care | Payer: BC Managed Care – PPO | Source: Ambulatory Visit | Attending: Radiation Oncology | Admitting: Radiation Oncology

## 2013-06-28 NOTE — [facility] Oncology Navigator Note (Signed)
Met with patient during scheduled appt with Dr. Alvy Bimler to provide support and maintain care continuity.  Patient denied any concerns.  Patient stated he needed another tube of Biafine which I obtained for delivery tomorrow when he has his final RT.  Gayleen Orem, RN, BSN, [facility] Head & Neck Oncology Navigator [phone]

## 2013-06-28 NOTE — [facility] Oncology Navigator Note (Signed)
Met with patient and his wife during final RT, celebrated the occasion with them.  Provided him with a tube of Biafine.  Explained that I will continue to be his Navigator for the next several months, encouraged him to call me with and questions/concerns/needs.  He verbalized understanding.  Initiating navigation as L3 patient (treatments completed) with this encounter.  Gayleen Orem, RN, BSN, [facility] Head & Neck Oncology Navigator [phone]

## 2013-06-30 ENCOUNTER — Encounter: Payer: Self-pay | Admitting: Radiation Oncology

## 2013-06-30 NOTE — Progress Notes (Signed)
  Radiation Oncology         [phone]) [phone] ________________________________  Name: Adrian [name] MRN: [id number]  Date: 06/30/2013  DOB: 09/25/1950  End of Treatment Note  Diagnosis:     Squamous cell carcinoma of the epiglottis  Primary site: Larynx - Supraglottis (Left), Clinical: Stage I (T1, N0, M0)    Indication for treatment:  Definitive treatment        Radiation treatment dates:   January 5 through February 20  Site/dose:   Epiglottis/neck region, 70 gray in 35 fractions  Beams/energy:  Helical intensity modulated radiation therapy, 6 MV photons  Narrative: The patient tolerated radiation treatment relatively well.   Towards the end of his treatment he did develop a significant sore throat limiting oral intake.  He was hesitant in using his gastrostomy feeding tube.  He also developed a Candida infection towards end of his therapy was treated with Diflucan.  Plan: The patient has completed radiation treatment. The patient will return to radiation oncology clinic for routine followup in one month. He will be seen by medical oncology in 2 weeks for followup of his myelodysplasia and tolerance to narcotics/pain.  I advised them to call or return sooner if they have any questions or concerns related to their recovery or treatment.  -----------------------------------  Blair Promise, PhD, MD

## 2013-07-02 ENCOUNTER — Telehealth: Payer: Self-pay | Admitting: Oncology

## 2013-07-02 ENCOUNTER — Encounter: Payer: Self-pay | Admitting: *Deleted

## 2013-07-02 NOTE — Telephone Encounter (Signed)
Called and spoke to [place], [place] wife.  She said he is having trouble swallowing and has a hoarse voice.  She says he can swallow water and is able to swallow his morphine pills.  She is putting 3 cans of ensure through his peg tube a day.  She reports that he is not as alert since taking the Morphine 15 mg q 12 hours.  He is not using his fentanyl patch or any other pain medications.  He also coughed up some thick phlegm with a little blood in it.  Will notify Dr. Sondra Come.

## 2013-07-02 NOTE — Progress Notes (Signed)
Note received from Call a Nurse.  Wife called them this morning to report sore/hoarse throat.  No fever.  They advised on throat care.  Dr. Alvy Bimler aware.

## 2013-07-03 ENCOUNTER — Emergency Department (HOSPITAL_COMMUNITY): Payer: BC Managed Care – PPO

## 2013-07-03 ENCOUNTER — Encounter (HOSPITAL_COMMUNITY): Payer: Self-pay | Admitting: Emergency Medicine

## 2013-07-03 ENCOUNTER — Telehealth: Payer: Self-pay | Admitting: Oncology

## 2013-07-03 ENCOUNTER — Other Ambulatory Visit: Payer: Self-pay

## 2013-07-03 ENCOUNTER — Inpatient Hospital Stay (HOSPITAL_COMMUNITY)
Admission: EM | Admit: 2013-07-03 | Discharge: 2013-07-09 | DRG: 640 | Disposition: A | Discharge status: Home / Self Care | Payer: BC Managed Care – PPO | Attending: Family Medicine | Admitting: Family Medicine

## 2013-07-03 ENCOUNTER — Encounter: Payer: Self-pay | Admitting: Radiation Oncology

## 2013-07-03 ENCOUNTER — Ambulatory Visit: Payer: BC Managed Care – PPO

## 2013-07-03 ENCOUNTER — Ambulatory Visit
Admission: RE | Admit: 2013-07-03 | Discharge: 2013-07-03 | Disposition: A | Discharge status: Home / Self Care | Payer: BC Managed Care – PPO | Source: Ambulatory Visit | Attending: Radiation Oncology | Admitting: Radiation Oncology

## 2013-07-03 ENCOUNTER — Encounter: Payer: Self-pay | Admitting: *Deleted

## 2013-07-03 ENCOUNTER — Encounter: Payer: Self-pay | Admitting: Nutrition

## 2013-07-03 ENCOUNTER — Other Ambulatory Visit: Payer: Self-pay | Admitting: Oncology

## 2013-07-03 VITALS — BP 79/61 | HR 113 | Temp 97.5°F | Ht 72.0 in | Wt 103.1 lb

## 2013-07-03 DIAGNOSIS — Z720 Tobacco use: Secondary | ICD-10-CM

## 2013-07-03 DIAGNOSIS — E43 Unspecified severe protein-calorie malnutrition: Secondary | ICD-10-CM | POA: Diagnosis present

## 2013-07-03 DIAGNOSIS — E875 Hyperkalemia: Principal | ICD-10-CM | POA: Diagnosis present

## 2013-07-03 DIAGNOSIS — Z8261 Family history of arthritis: Secondary | ICD-10-CM

## 2013-07-03 DIAGNOSIS — J181 Lobar pneumonia, unspecified organism: Secondary | ICD-10-CM

## 2013-07-03 DIAGNOSIS — D638 Anemia in other chronic diseases classified elsewhere: Secondary | ICD-10-CM | POA: Diagnosis present

## 2013-07-03 DIAGNOSIS — Z79899 Other long term (current) drug therapy: Secondary | ICD-10-CM

## 2013-07-03 DIAGNOSIS — Z87891 Personal history of nicotine dependence: Secondary | ICD-10-CM

## 2013-07-03 DIAGNOSIS — R070 Pain in throat: Secondary | ICD-10-CM

## 2013-07-03 DIAGNOSIS — I472 Ventricular tachycardia, unspecified: Secondary | ICD-10-CM | POA: Diagnosis present

## 2013-07-03 DIAGNOSIS — J9601 Acute respiratory failure with hypoxia: Secondary | ICD-10-CM

## 2013-07-03 DIAGNOSIS — F411 Generalized anxiety disorder: Secondary | ICD-10-CM | POA: Diagnosis present

## 2013-07-03 DIAGNOSIS — J029 Acute pharyngitis, unspecified: Secondary | ICD-10-CM

## 2013-07-03 DIAGNOSIS — J449 Chronic obstructive pulmonary disease, unspecified: Secondary | ICD-10-CM

## 2013-07-03 DIAGNOSIS — C329 Malignant neoplasm of larynx, unspecified: Secondary | ICD-10-CM

## 2013-07-03 DIAGNOSIS — C321 Malignant neoplasm of supraglottis: Secondary | ICD-10-CM

## 2013-07-03 DIAGNOSIS — E860 Dehydration: Secondary | ICD-10-CM | POA: Insufficient documentation

## 2013-07-03 DIAGNOSIS — Z923 Personal history of irradiation: Secondary | ICD-10-CM

## 2013-07-03 DIAGNOSIS — D4620 Refractory anemia with excess of blasts, unspecified: Secondary | ICD-10-CM

## 2013-07-03 DIAGNOSIS — K219 Gastro-esophageal reflux disease without esophagitis: Secondary | ICD-10-CM | POA: Diagnosis present

## 2013-07-03 DIAGNOSIS — E871 Hypo-osmolality and hyponatremia: Secondary | ICD-10-CM

## 2013-07-03 DIAGNOSIS — N179 Acute kidney failure, unspecified: Secondary | ICD-10-CM

## 2013-07-03 DIAGNOSIS — D72829 Elevated white blood cell count, unspecified: Secondary | ICD-10-CM

## 2013-07-03 DIAGNOSIS — E876 Hypokalemia: Secondary | ICD-10-CM | POA: Diagnosis present

## 2013-07-03 DIAGNOSIS — R778 Other specified abnormalities of plasma proteins: Secondary | ICD-10-CM

## 2013-07-03 DIAGNOSIS — R6520 Severe sepsis without septic shock: Secondary | ICD-10-CM

## 2013-07-03 DIAGNOSIS — N19 Unspecified kidney failure: Secondary | ICD-10-CM | POA: Diagnosis present

## 2013-07-03 DIAGNOSIS — D649 Anemia, unspecified: Secondary | ICD-10-CM | POA: Diagnosis present

## 2013-07-03 DIAGNOSIS — Z930 Tracheostomy status: Secondary | ICD-10-CM

## 2013-07-03 DIAGNOSIS — G40909 Epilepsy, unspecified, not intractable, without status epilepticus: Secondary | ICD-10-CM | POA: Diagnosis present

## 2013-07-03 DIAGNOSIS — Z8589 Personal history of malignant neoplasm of other organs and systems: Secondary | ICD-10-CM | POA: Diagnosis present

## 2013-07-03 DIAGNOSIS — M069 Rheumatoid arthritis, unspecified: Secondary | ICD-10-CM | POA: Diagnosis present

## 2013-07-03 DIAGNOSIS — Z794 Long term (current) use of insulin: Secondary | ICD-10-CM

## 2013-07-03 DIAGNOSIS — I4729 Other ventricular tachycardia: Secondary | ICD-10-CM | POA: Diagnosis present

## 2013-07-03 DIAGNOSIS — R6521 Severe sepsis with septic shock: Secondary | ICD-10-CM

## 2013-07-03 DIAGNOSIS — D469 Myelodysplastic syndrome, unspecified: Secondary | ICD-10-CM | POA: Diagnosis present

## 2013-07-03 DIAGNOSIS — R7989 Other specified abnormal findings of blood chemistry: Secondary | ICD-10-CM

## 2013-07-03 DIAGNOSIS — A419 Sepsis, unspecified organism: Secondary | ICD-10-CM

## 2013-07-03 DIAGNOSIS — J850 Gangrene and necrosis of lung: Secondary | ICD-10-CM

## 2013-07-03 DIAGNOSIS — N390 Urinary tract infection, site not specified: Secondary | ICD-10-CM

## 2013-07-03 DIAGNOSIS — D46Z Other myelodysplastic syndromes: Secondary | ICD-10-CM | POA: Diagnosis present

## 2013-07-03 DIAGNOSIS — Z681 Body mass index (BMI) 19 or less, adult: Secondary | ICD-10-CM

## 2013-07-03 DIAGNOSIS — J189 Pneumonia, unspecified organism: Secondary | ICD-10-CM

## 2013-07-03 LAB — CBC
HEMATOCRIT: 25 % — AB (ref 39.0–52.0)
Hemoglobin: 8.4 g/dL — ABNORMAL LOW (ref 13.0–17.0)
MCH: 37.8 pg — AB (ref 26.0–34.0)
MCHC: 33.6 g/dL (ref 30.0–36.0)
MCV: 112.6 fL — ABNORMAL HIGH (ref 78.0–100.0)
Platelets: 243 10*3/uL (ref 150–400)
RBC: 2.22 MIL/uL — ABNORMAL LOW (ref 4.22–5.81)
RDW: 18 % — ABNORMAL HIGH (ref 11.5–15.5)
WBC: 5.2 10*3/uL (ref 4.0–10.5)

## 2013-07-03 LAB — CBC WITH DIFFERENTIAL/PLATELET
BASO%: 0.3 % (ref 0.0–2.0)
BASOS ABS: 0 10*3/uL (ref 0.0–0.1)
EOS ABS: 0.1 10*3/uL (ref 0.0–0.5)
EOS%: 1 % (ref 0.0–7.0)
HEMATOCRIT: 28.8 % — AB (ref 38.4–49.9)
HGB: 9.7 g/dL — ABNORMAL LOW (ref 13.0–17.1)
LYMPH%: 7.4 % — ABNORMAL LOW (ref 14.0–49.0)
MCH: 39.1 pg — ABNORMAL HIGH (ref 27.2–33.4)
MCHC: 33.6 g/dL (ref 32.0–36.0)
MCV: 116.2 fL — AB (ref 79.3–98.0)
MONO#: 0.8 10*3/uL (ref 0.1–0.9)
MONO%: 11.5 % (ref 0.0–14.0)
NEUT%: 79.8 % — ABNORMAL HIGH (ref 39.0–75.0)
NEUTROS ABS: 5.4 10*3/uL (ref 1.5–6.5)
PLATELETS: 258 10*3/uL (ref 140–400)
RBC: 2.48 10*6/uL — ABNORMAL LOW (ref 4.20–5.82)
RDW: 19.2 % — ABNORMAL HIGH (ref 11.0–14.6)
WBC: 6.7 10*3/uL (ref 4.0–10.3)
lymph#: 0.5 10*3/uL — ABNORMAL LOW (ref 0.9–3.3)

## 2013-07-03 LAB — BASIC METABOLIC PANEL (CC13)
Anion Gap: 12 mEq/L — ABNORMAL HIGH (ref 3–11)
BUN: 103.2 mg/dL — ABNORMAL HIGH (ref 7.0–26.0)
CALCIUM: 9.7 mg/dL (ref 8.4–10.4)
CO2: 21 meq/L — AB (ref 22–29)
Chloride: 101 mEq/L (ref 98–109)
Creatinine: 1.4 mg/dL — ABNORMAL HIGH (ref 0.7–1.3)
GLUCOSE: 127 mg/dL (ref 70–140)
Sodium: 134 mEq/L — ABNORMAL LOW (ref 136–145)

## 2013-07-03 LAB — I-STAT CHEM 8, ED
BUN: 121 mg/dL — ABNORMAL HIGH (ref 6–23)
CREATININE: 1.4 mg/dL — AB (ref 0.50–1.35)
Calcium, Ion: 1.14 mmol/L (ref 1.13–1.30)
Chloride: 101 mEq/L (ref 96–112)
GLUCOSE: 160 mg/dL — AB (ref 70–99)
HEMATOCRIT: 29 % — AB (ref 39.0–52.0)
HEMOGLOBIN: 9.9 g/dL — AB (ref 13.0–17.0)
Potassium: 5.8 mEq/L — ABNORMAL HIGH (ref 3.7–5.3)
Sodium: 133 mEq/L — ABNORMAL LOW (ref 137–147)
TCO2: 23 mmol/L (ref 0–100)

## 2013-07-03 LAB — I-STAT CG4 LACTIC ACID, ED: LACTIC ACID, VENOUS: 1.34 mmol/L (ref 0.5–2.2)

## 2013-07-03 LAB — COMPREHENSIVE METABOLIC PANEL
ALT: 22 U/L (ref 0–53)
AST: 19 U/L (ref 0–37)
Albumin: 3.2 g/dL — ABNORMAL LOW (ref 3.5–5.2)
Alkaline Phosphatase: 195 U/L — ABNORMAL HIGH (ref 39–117)
BILIRUBIN TOTAL: 0.2 mg/dL — AB (ref 0.3–1.2)
BUN: 106 mg/dL — AB (ref 6–23)
CALCIUM: 9.3 mg/dL (ref 8.4–10.5)
CHLORIDE: 94 meq/L — AB (ref 96–112)
CO2: 21 mEq/L (ref 19–32)
CREATININE: 1.3 mg/dL (ref 0.50–1.35)
GFR calc Af Amer: 65 mL/min — ABNORMAL LOW (ref >90)
GFR calc non Af Amer: 56 mL/min — ABNORMAL LOW (ref >90)
Glucose, Bld: 159 mg/dL — ABNORMAL HIGH (ref 70–99)
Potassium: 6.1 mEq/L — ABNORMAL HIGH (ref 3.7–5.3)
Sodium: 131 mEq/L — ABNORMAL LOW (ref 137–147)
TOTAL PROTEIN: 9.3 g/dL — AB (ref 6.0–8.3)

## 2013-07-03 LAB — OCCULT BLOOD X 1 CARD TO LAB, STOOL: Fecal Occult Bld: POSITIVE — AB

## 2013-07-03 MED ORDER — OSMOLITE 1.2 CAL PO LIQD
1000.0000 mL | ORAL | Status: DC
  Administered 2013-07-03: 1000 mL
  Filled 2013-07-03: qty 1000

## 2013-07-03 MED ORDER — ONDANSETRON HCL 4 MG PO TABS: 4.0000 mg | ORAL_TABLET | Freq: Four times a day (QID) | ORAL | Status: DC | PRN

## 2013-07-03 MED ORDER — FENTANYL 25 MCG/HR TD PT72
25.0000 ug | MEDICATED_PATCH | TRANSDERMAL | Status: DC
  Administered 2013-07-04 – 2013-07-07 (×2): 25 ug via TRANSDERMAL
  Filled 2013-07-03 (×2): qty 1

## 2013-07-03 MED ORDER — ONDANSETRON HCL 4 MG/2ML IJ SOLN: 4.0000 mg | Freq: Four times a day (QID) | INTRAMUSCULAR | Status: DC | PRN

## 2013-07-03 MED ORDER — FLUCONAZOLE 100 MG PO TABS
100.0000 mg | ORAL_TABLET | Freq: Every day | ORAL | Status: DC
  Administered 2013-07-03: 100 mg via ORAL
  Filled 2013-07-03 (×2): qty 1

## 2013-07-03 MED ORDER — HYDROCODONE-ACETAMINOPHEN 7.5-325 MG/15ML PO SOLN
10.0000 mL | Freq: Four times a day (QID) | ORAL | Status: DC | PRN
  Administered 2013-07-03 – 2013-07-04 (×2): 10 mL via ORAL
  Filled 2013-07-03 (×2): qty 15

## 2013-07-03 MED ORDER — DIAZEPAM 5 MG PO TABS: 5.0000 mg | ORAL_TABLET | Freq: Four times a day (QID) | ORAL | Status: DC | PRN

## 2013-07-03 MED ORDER — ENOXAPARIN SODIUM 30 MG/0.3ML ~~LOC~~ SOLN
30.0000 mg | SUBCUTANEOUS | Status: DC
  Administered 2013-07-03 – 2013-07-06 (×4): 30 mg via SUBCUTANEOUS
  Filled 2013-07-03 (×5): qty 0.3

## 2013-07-03 MED ORDER — FREE WATER
200.0000 mL | Freq: Three times a day (TID) | Status: DC
  Administered 2013-07-03 – 2013-07-06 (×8): 200 mL

## 2013-07-03 MED ORDER — SODIUM CHLORIDE 0.9 % IV SOLN
1.0000 g | Freq: Once | INTRAVENOUS | Status: AC
  Administered 2013-07-03: 1 g via INTRAVENOUS
  Filled 2013-07-03: qty 10

## 2013-07-03 MED ORDER — SILVER SULFADIAZINE 1 % EX CREA
TOPICAL_CREAM | Freq: Two times a day (BID) | CUTANEOUS | Status: DC
  Administered 2013-07-03: 15:00:00 via TOPICAL

## 2013-07-03 MED ORDER — SODIUM POLYSTYRENE SULFONATE 15 GM/60ML PO SUSP
30.0000 g | Freq: Once | ORAL | Status: AC
  Administered 2013-07-03: 30 g
  Filled 2013-07-03: qty 120

## 2013-07-03 MED ORDER — SODIUM CHLORIDE 0.9 % IV BOLUS (SEPSIS)
1000.0000 mL | Freq: Once | INTRAVENOUS | Status: AC
  Administered 2013-07-03: 1000 mL via INTRAVENOUS

## 2013-07-03 MED ORDER — PHENYTOIN SODIUM EXTENDED 100 MG PO CAPS
200.0000 mg | ORAL_CAPSULE | Freq: Two times a day (BID) | ORAL | Status: DC
  Administered 2013-07-03: 200 mg via ORAL
  Filled 2013-07-03 (×3): qty 2

## 2013-07-03 MED ORDER — SODIUM CHLORIDE 0.9 % IV SOLN
INTRAVENOUS | Status: DC
Start: 2013-07-03 — End: 2013-07-09
  Administered 2013-07-03 – 2013-07-05 (×4): via INTRAVENOUS

## 2013-07-03 MED ORDER — HYDROCODONE-ACETAMINOPHEN 7.5-325 MG/15ML PO SOLN: 10.0000 mL | Freq: Four times a day (QID) | ORAL | Status: DC | PRN

## 2013-07-03 MED ORDER — DEXTROSE-NACL 5-0.45 % IV SOLN
Freq: Once | INTRAVENOUS | Status: AC
  Administered 2013-07-03: 14:00:00 via INTRAVENOUS

## 2013-07-03 MED ORDER — SODIUM CHLORIDE 0.9 % IJ SOLN
3.0000 mL | Freq: Two times a day (BID) | INTRAMUSCULAR | Status: DC
  Administered 2013-07-03 – 2013-07-08 (×4): 3 mL via INTRAVENOUS

## 2013-07-03 MED ORDER — SODIUM BICARBONATE 8.4 % IV SOLN
50.0000 meq | Freq: Once | INTRAVENOUS | Status: AC
  Administered 2013-07-03: 50 meq via INTRAVENOUS
  Filled 2013-07-03: qty 50

## 2013-07-03 MED ORDER — INSULIN ASPART 100 UNIT/ML IV SOLN
5.0000 [IU] | Freq: Once | INTRAVENOUS | Status: AC
  Administered 2013-07-03: 5 [IU] via INTRAVENOUS
  Filled 2013-07-03: qty 0.05

## 2013-07-03 MED ORDER — DEXTROSE 50 % IV SOLN
50.0000 mL | Freq: Once | INTRAVENOUS | Status: AC
  Administered 2013-07-03: 50 mL via INTRAVENOUS
  Filled 2013-07-03: qty 50

## 2013-07-03 NOTE — ED Notes (Signed)
Bed: WA02 Expected date:  Expected time:  Means of arrival:  Comments: Cancer center

## 2013-07-03 NOTE — Progress Notes (Signed)
Transferred pt. To Emergency dept per Dr. Alvy Bimler.  R/t abnormal labs.

## 2013-07-03 NOTE — Telephone Encounter (Signed)
Called Cameo, RN, Dr. Calton Dach nurse to see if Mr. Ishaq needs to be admitted due to his bmet results.  Per Cameo, he needs to go to the ER.  Called infusion and they will bring him to the ER.  Called ER and talked to the charge nurse to give her report and to let her know that he is on the way over the ER.

## 2013-07-03 NOTE — H&P (Signed)
PCP:   [name], MD   Chief Complaint:  Abnormal labs  HPI: Adrian [name] [name] who was recently diagnosed with squamous cell carcinoma of the epiglottis, status post 9 weeks of radiation treatment which ended last week on Friday today went to oncology clinic for blood work, where he was found to have abnormal labs with high potassium and sent to ED for further evaluation. Patient has not been eating and drinking well, he does have a PEG tube in placed in September last year. His wife has been trying to give him milk through the PEG tube. Patient has severe protein calorie malnutrition and has lost significant amount of weight. Today he weighs Adrian [name].7 Kg. Patient denies any symptoms, except pain in the throat and difficulty swallowing. He denies chest pain, no shortness of breath, no palpitations no nausea vomiting no diarrhea.  Allergies:  No Known Allergies    Past Medical History  Diagnosis Date  . Megaloblastic anemia   . Emphysema of lung   . Arthritis     rheumatoid  . Esophageal reflux   . Anxiety   . Shortness of breath     with exertion  . Pneumonia 01/2013  . Seizures     last one was 8 years ago  . History of blood transfusion   . Cancer 03/11/2013    larnyx/epiglottic Squamous cell in situ  . Throat pain 06/13/2013    Past Surgical History  Procedure Laterality Date  . Abdominal hernia repair    . Tracheostomy  2014  . Peg placement  2014  . Colon surgery    . Multiple extractions with alveoloplasty N/A 04/16/2013    Procedure: Extraction of tooth #'s 2,5,6,7,10,11,15,17,21,22,27,29 with alveoloplasty, bilateral maxillary fibrous tuberosity reductions, removal of excess tissues of mandibular arch, and mandibular left lingual exostoses reductions.;  Surgeon: Lenn Cal, DDS;  Location: [facility];  Service: Oral Surgery;  Laterality: N/A;    Prior to Admission medications   Medication Sig Start Date End Date Taking? Authorizing Provider  cyanocobalamin 500 MCG  tablet Take 500 mcg by mouth daily.    Historical Provider, MD  diazepam (VALIUM) 5 MG tablet Take 5 mg by mouth every 6 (six) hours as needed for anxiety.    Historical Provider, MD  emollient (BIAFINE) cream Apply 1 application topically daily. Apply to affected skin area on neck after rad txs when skin becomes irritated ot itching,dryness daily 05/15/13   Blair Promise, MD  Fe Fum-Vit C-Vit B12-FA (HEMATOGEN FORTE PO) Take 1 capsule by mouth daily.    Historical Provider, MD  fentaNYL (DURAGESIC - DOSED MCG/HR) 25 MCG/HR patch Place 1 patch (25 mcg total) onto the skin every 3 (three) days. 06/13/13   [name], MD  fluconazole (DIFLUCAN) 100 MG tablet Take 1 tablet (100 mg total) by mouth daily. 06/25/13   Blair Promise, MD  folic acid (FOLVITE) 1 MG tablet Place 1 tablet (1 mg total) into feeding tube daily. 02/12/13   Erick Colace, NP  HYDROcodone-acetaminophen (HYCET) 7.5-325 mg/15 ml solution Take 10 mLs by mouth 4 (four) times daily as needed for moderate pain. 07/03/13   Blair Promise, MD  mometasone (NASONEX) 50 MCG/ACT nasal spray Place 2 sprays into both nostrils daily.    Historical Provider, MD  morphine (MS CONTIN) 15 MG 12 hr tablet Take 1 tablet (15 mg total) by mouth every 12 (twelve) hours. 06/27/13   [name], MD  oxyCODONE-acetaminophen (PERCOCET) 5-325 MG per tablet  Take 1 tablet by mouth every 6 (six) hours as needed for severe pain. 06/25/13   Blair Promise, MD  phenytoin (DILANTIN) 100 MG ER capsule Take 200 mg by mouth 2 (two) times daily.    Historical Provider, MD  vitamin E 1000 UNIT capsule Take 500 Units by mouth daily.    Historical Provider, MD    Social History:  reports that he quit smoking about 2 months ago. His smoking use included Cigarettes. He has a 40 pack-year smoking history. He has never used smokeless tobacco. He reports that he does not drink alcohol or use illicit drugs.  Family History  Problem Relation Age of Onset  . Hypertension Mother   .  Arthritis/Rheumatoid Mother   . Stroke Mother      All the positives are listed in BOLD  Review of Systems:  HEENT: Headache, blurred vision, runny nose, sore throat Neck: Hypothyroidism, hyperthyroidism,,lymphadenopathy Chest : Shortness of breath, history of COPD, Asthma Heart : Chest pain, history of coronary arterey disease GI:  Nausea, vomiting, diarrhea, constipation, GERD GU: Dysuria, urgency, frequency of urination, hematuria Neuro: Stroke, seizures, syncope Psych: Depression, anxiety, hallucinations   Physical Exam: Blood pressure 107/64, pulse 77, temperature 98.7 F (37.1 C), temperature source Oral, resp. rate 14, SpO2 100.00%. Constitutional:   Patient is a well-developed and well-nourished [name]* in no acute distress and cooperative with exam. Head: Normocephalic and atraumatic Mouth: Mucus membranes moist Eyes: PERRL, EOMI, conjunctivae normal Neck: Supple, No Thyromegaly Cardiovascular: RRR, S1 normal, S2 normal Pulmonary/Chest: CTAB, no wheezes, rales, or rhonchi Abdominal: Soft. Non-tender, PEG tube in place, non-distended, bowel sounds are normal, no masses, organomegaly, or guarding present.  Neurological: A&O x3, Strenght is normal and symmetric bilaterally, cranial nerve II-XII are grossly intact, no focal motor deficit, sensory intact to light touch bilaterally.  Extremities : No Cyanosis, Clubbing or Edema   Labs on Admission:  Results for orders placed during the hospital encounter of 07/03/13 (from the past 48 hour(s))  CBC     Status: Abnormal   Collection Time    07/03/13  3:Adrian [name] PM      Result Value Ref Range   WBC 5.2  4.0 - 10.5 K/uL   RBC 2.22 (*) 4.22 - 5.81 MIL/uL   Hemoglobin 8.4 (*) 13.0 - 17.0 g/dL   HCT 25.0 (*) 39.0 - 52.0 %   MCV 112.6 (*) 78.0 - 100.0 fL   MCH 37.8 (*) 26.0 - 34.0 pg   MCHC 33.6  30.0 - 36.0 g/dL   RDW 18.0 (*) 11.5 - 15.5 %   Platelets 243  150 - 400 K/uL  COMPREHENSIVE METABOLIC PANEL     Status: Abnormal    Collection Time    07/03/13  3:Adrian [name] PM      Result Value Ref Range   Sodium 131 (*) 137 - 147 mEq/L   Potassium 6.1 (*) 3.7 - 5.3 mEq/L   Chloride 94 (*) 96 - 112 mEq/L   CO2 21  19 - 32 mEq/L   Glucose, Bld 159 (*) 70 - 99 mg/dL   BUN 106 (*) 6 - 23 mg/dL   Creatinine, Ser 1.30  0.50 - 1.35 mg/dL   Calcium 9.3  8.4 - 10.5 mg/dL   Total Protein 9.3 (*) 6.0 - 8.3 g/dL   Albumin 3.2 (*) 3.5 - 5.2 g/dL   AST 19  0 - 37 U/L   ALT 22  0 - 53 U/L   Alkaline Phosphatase 195 (*) 39 -  117 U/L   Total Bilirubin 0.2 (*) 0.3 - 1.2 mg/dL   GFR calc non Af Amer 56 (*) >90 mL/min   GFR calc Af Amer 65 (*) >90 mL/min   Comment: (NOTE)     The eGFR has been calculated using the CKD EPI equation.     This calculation has not been validated in all clinical situations.     eGFR's persistently <90 mL/min signify possible Chronic Kidney     Disease.  I-STAT CHEM 8, ED     Status: Abnormal   Collection Time    07/03/13  3:55 PM      Result Value Ref Range   Sodium 133 (*) 137 - 147 mEq/L   Potassium 5.8 (*) 3.7 - 5.3 mEq/L   Chloride 101  96 - 112 mEq/L   BUN 121 (*) 6 - 23 mg/dL   Creatinine, Ser 1.40 (*) 0.50 - 1.35 mg/dL   Glucose, Bld 160 (*) 70 - 99 mg/dL   Calcium, Ion 1.14  1.13 - 1.30 mmol/L   TCO2 23  0 - 100 mmol/L   Hemoglobin 9.9 (*) 13.0 - 17.0 g/dL   HCT 29.0 (*) 39.0 - 52.0 %  I-STAT CG4 LACTIC ACID, ED     Status: None   Collection Time    07/03/13  3:55 PM      Result Value Ref Range   Lactic Acid, Venous 1.34  0.5 - 2.2 mmol/L    Radiological Exams on Admission: Dg Chest 2 View  07/03/2013   CLINICAL DATA:  Shortness of breath and weakness. History of laryngeal and epiglottic cancer.  EXAM: CHEST  2 VIEW  COMPARISON:  April 15, 2013  FINDINGS: The heart size and mediastinal contours are within normal limits. Stable asymmetric scarring is identified in the right apex unchanged compared to prior chest x-ray. There is no pulmonary edema or pleural effusion. The visualized  skeletal structures are stable.  IMPRESSION: No active cardiopulmonary disease.   Electronically Signed   By: Abelardo Diesel M.D.   On: 07/03/2013 16:40    Assessment/Plan Principal Problem:   Hyperkalemia Active Problems:   Anemia, unspecified   MDS (myelodysplastic syndrome), low grade   Seizure disorder   Protein-calorie malnutrition, severe   Squamous cell carcinoma of the epiglottis  elevated BUN  Hyperkalemia Patient has potassium of 6.1, has been given insulin and dextrose, calcium gluconate, sodium bicarbonate in the ED. We'll give a dose of Kayexalate 30 g x1 to PEG tube. And check BMP in the morning. EKG shows normal sinus rhythm.  Uremia Patient has significant elevation of BUN, 103. ? Cause, ? dehydration will start IV fluids and check renal functions in the morning. Patient does not have mental status changes at this time.  Severe protein calorie malnutrition We'll get nutrition consult, and start the patient on osmolite by PEG tube at 30 mL per hour.  Anemia Secondary to anemia of chronic disease Also check stool for occult blood Patient has a history of myelodysplastic syndrome  Seizure disorder Continue Dilantin  DVT prophylaxis Lovenox   Code status: patient is full code   Family discussion: discussed with patient's daughter at bedside    Time Spent on Admission:  3 minutes  [facility] Pager: [phone] 07/03/2013, 5:22 PM  If 7PM-7AM, please contact night-coverage  [email or web address]  Password [email or web address]

## 2013-07-03 NOTE — Progress Notes (Signed)
Adrian [name] here for follow up after treatment to his larynx/supraglottics.  He is having throat pain and trouble swallowing.  He rates the pain at a 10.  He is not wearing his fentanyl patch and has been taking percocet prn q 6 hours.  He last took morphine yesterday and his wife is concerned because she said he was "out of it" when he took it.  He has a productive cough and says he coughed up blood mixed in with the phlegm yesterday and this morning.  He is able to swallow his pills with a small amount of water.  He has lost 5 lbs since 06/27/13.  He is taking in 4 cans of ensure with 120 cc flushes of water after them through his feeding tube.  He has a scabbed area on his left neck that is swollen.  He has peeling and hyperpigmentation over the rest of his neck.  His tongue has a white coating on it and he says that he is done with the diflucan.  Orthostatic vitals done: bp sitting 102/65, hr 93.  BP standing 79/61, hr 113.  He is weak and unsteady on his feet.  His wife also says he has hemorrhoids and his skin is irritated in his rectal area.

## 2013-07-03 NOTE — Progress Notes (Signed)
Met with patient and his wife prior to and during follow-up appt with Dr. Sondra Come; appt made at wife's request d/t patient's expressed throat pain, inability to swallow, lethargy.  During my conversation with wife in [facility] waiting area, she stated that they were in need of additional nutritional supplement.  In response to my inquiry, she stated patient is only administering 2 cans of Ensure daily with minimal water prior to and after instillation.  I re-educated on importance of maintaining hydration at level of 64 ounces water daily, maintaining nutrition with up to 6 cans daily if patient is not eating.  She verbalized understanding.  I consulted with Dory Peru, RD, who provided me with nutritional/hydration plan which I gave to and explained to wife.  She verbalized understanding.  I provided her a new syringe, re-educated on use.  She verbalized understanding.  Dory Peru provide me with 2 cases of Osmolite which I gave to patient; I explained that while the brand is different from their current use, the nutritional value is comparable.  Wife expressed understanding.  Dr. Sondra Come ordered fluids for patient r/t suspected hydration, patient presentation.  Upon receipt of metabolic panel, patient was directed to ED by Dr. Alvy Bimler.  I met with patient in ED, provided attending RN and NT background information.  Will follow patient during inpatient admission.  Gayleen Orem, RN, BSN, [facility] Head & Neck Oncology Navigator [phone]

## 2013-07-03 NOTE — ED Notes (Signed)
Pt from cancer center, was sent over for abnormal labs.

## 2013-07-03 NOTE — Progress Notes (Signed)
Utilization Review completed.  Raynisha Avilla RN CM  

## 2013-07-03 NOTE — Telephone Encounter (Signed)
Called Hassan Rowan to see how Maks is doing.  She said he is still having throat pain and she has noticed that he has an area of swelling or a "knot" on his right neck.  She also said that he is weak and she is having to help him get up to go to the bathroom.  Notified Dr. Sondra Come who would like him to come in for follow up today.  Called Hassan Rowan back and she will bring Micheal in 30 minutes.

## 2013-07-03 NOTE — ED Provider Notes (Signed)
CSN: [id number]     Arrival date & time 07/03/13  1447 History   First MD Initiated Contact with Patient 07/03/13 1528     Chief Complaint  Patient presents with  . 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 Alvy Bimler. On chart review, it is noted that he had a potassium of 6.0 earlier today. Currently patient is complaining of a sore throat and a productive cough with green phlegm that he has had for the past week. Admits to weakness and fatigue. Denies chest pain, shortness of breath, nausea, vomiting.  The history is provided by the patient and medical records.    Past Medical History  Diagnosis Date  . Megaloblastic anemia   . Emphysema of lung   . Arthritis     rheumatoid  . Esophageal reflux   . Anxiety   . Shortness of breath     with exertion  . Pneumonia 01/2013  . Seizures     last one was 8 years ago  . History of blood transfusion   . Cancer 03/11/2013    larnyx/epiglottic Squamous cell in situ  . Throat pain 06/13/2013   Past Surgical History  Procedure Laterality Date  . Abdominal hernia repair    . Tracheostomy  2014  . Peg placement  2014  . Colon surgery    . Multiple extractions with alveoloplasty N/A 04/16/2013    Procedure: Extraction of tooth #'s 2,5,6,7,10,11,15,17,21,22,27,29 with alveoloplasty, bilateral maxillary fibrous tuberosity reductions, removal of excess tissues of mandibular arch, and mandibular left lingual exostoses reductions.;  Surgeon: Lenn Cal, DDS;  Location: [facility];  Service: Oral Surgery;  Laterality: N/A;     Family History  Problem Relation Age of Onset  . Hypertension Mother   . Arthritis/Rheumatoid Mother   . Stroke Mother    History  Substance Use Topics  . Smoking status: Former Smoker -- 1.00 packs/day for 40 years    Types: Cigarettes    Quit date: 04/15/2013  . Smokeless tobacco: Never Used     Comment: patient states he is slowly quitting on his own - does not want assistance  . Alcohol Use: No     Comment: once every three months/ h/o etoh abuse    Review of Systems  Constitutional: Positive for fatigue.  HENT: Positive for sore throat.   Respiratory: Positive for cough.   Neurological: Positive for weakness.  All other systems reviewed and are negative.      Allergies  Review of patient's allergies indicates no known allergies.  Home Medications   No current outpatient prescriptions on file. BP 114/62  Pulse 91  Temp(Src) 98.3 F (36.8 C) (Oral)  Resp 20  Ht 6' (1.829 m)  Wt 101 lb 6.4 oz (45.995 kg)  BMI 13.75 kg/m2  SpO2 97% Physical Exam  Nursing note and vitals reviewed. Constitutional: He is oriented to person, place, and time. He appears well-nourished. He appears cachectic. He appears ill.  HENT:  Head: Normocephalic and atraumatic.  Mouth/Throat: Oropharynx is clear and moist.  Eyes: Conjunctivae are normal.  Neck: Normal range of motion. Neck supple.  Cardiovascular: Normal rate, regular rhythm, normal  heart sounds and intact distal pulses.   Pulmonary/Chest: Effort normal. No respiratory distress. He has rhonchi (scattered).  Abdominal: Soft. Bowel sounds are normal. There is generalized tenderness (mild). There is no rigidity, no rebound and no guarding.    No peritoneal signs.  Musculoskeletal: Normal range of motion. He exhibits no edema.  Neurological: He is alert and oriented to person, place, and time.  Skin: Skin is warm and dry. He is not diaphoretic.  Psychiatric: He has a normal mood and affect. His behavior is normal.    ED  Course  Procedures (including critical care time) CRITICAL CARE Performed by: Michele Mcalpine   Total critical care time: 30 minutes  Critical care time was exclusive of separately billable procedures and treating other patients.  Critical care was necessary to treat or prevent imminent or life-threatening deterioration.  Critical care was time spent personally by me on the following activities: development of treatment plan with patient and/or surrogate as well as nursing, discussions with consultants, evaluation of patient's response to treatment, examination of patient, obtaining history from patient or surrogate, ordering and performing treatments and interventions, ordering and review of laboratory studies, ordering and review of radiographic studies, pulse oximetry and re-evaluation of patient's condition.  Labs Review Labs Reviewed  CBC - Abnormal; Notable for the following:    RBC 2.22 (*)    Hemoglobin 8.4 (*)    HCT 25.0 (*)    MCV 112.6 (*)    MCH 37.8 (*)    RDW 18.0 (*)    All other components within normal limits  COMPREHENSIVE METABOLIC PANEL - Abnormal; Notable for the following:    Sodium 131 (*)    Potassium 6.1 (*)    Chloride 94 (*)    Glucose, Bld 159 (*)    BUN 106 (*)    Total Protein 9.3 (*)    Albumin 3.2 (*)    Alkaline Phosphatase 195 (*)    Total Bilirubin 0.2 (*)    GFR calc non Af Amer 56 (*)    GFR calc Af Amer 65 (*)    All other components within normal limits  OCCULT BLOOD X 1 CARD TO LAB, STOOL - Abnormal; Notable for the following:    Fecal Occult Bld POSITIVE (*)    All other components within normal limits  I-STAT CHEM 8, ED - Abnormal; Notable for the following:    Sodium 133 (*)    Potassium 5.8 (*)    BUN 121 (*)    Creatinine, Ser 1.40 (*)    Glucose, Bld 160 (*)    Hemoglobin 9.9 (*)    HCT 29.0 (*)    All other components within normal limits  CBC  COMPREHENSIVE METABOLIC PANEL  I-STAT CG4 LACTIC ACID, ED   Imaging  Review Dg Chest 2 View  07/03/2013   CLINICAL DATA:  Shortness of breath and weakness. History of laryngeal and epiglottic cancer.  EXAM: CHEST  2 VIEW  COMPARISON:  April 15, 2013  FINDINGS: The heart size and mediastinal contours are within normal limits. Stable asymmetric scarring is identified in the right apex unchanged compared to prior chest x-ray. There is no pulmonary edema or pleural effusion. The visualized skeletal structures are stable.  IMPRESSION: No active cardiopulmonary disease.   Electronically Signed   By: Abelardo Diesel M.D.   On: 07/03/2013 16:40      MDM   Final diagnoses:  Hyperkalemia   Patient presenting from [facility] with hyperkalemia. He is cachectic  but in no apparent distress. Complaining of a cough and her throat pain. Undergoing radiation treatment for larynx/epiglottic cancer. Potassium level 6.1. Pt given calcium gluconate, insulin, D50 and bicarb. CXR without acute finding. No EKG changes. Pt admitted for both anemia and hyperkalemia, admission accepted by Dr. Darrick Meigs, [facility]. Case discussed with attending Dr. Aline Brochure who agrees with plan of care.     Illene Labrador, PA-C 07/04/13 [place], PA-C 07/04/13 Shelah Lewandowsky

## 2013-07-03 NOTE — Progress Notes (Signed)
Cleaned Adrian [name]'s neck with normal saline and a q-tip.  Gave him silvadene cream per Dr. Sondra Come and advised him to apply it twice a day to the scabbed area on his neck.

## 2013-07-03 NOTE — Progress Notes (Signed)
Patient navigator reports patient is in need of samples of Ensure Plus and is requesting to take samples to patient today while he is here.  Patient has been unable to swallow anything by mouth and has been using Ensure Plus in his feeding tube.  He has been tolerating between 2 and 4 cans daily via feeding tube.  When I saw patient one week ago, I educated and encouraged him to begin using feeding tube on a regular basis.  Patient has been hesitant to use his feeding tube in the past.  Weight has decreased to 103.1 pounds February 25 from 109.2 pounds February 19, and 111.5 pounds February 17.  Labs include sodium 134, potassium 6.0, BUN 103.2, and creatinine 1.4 on February 25.  Revised Estimated nutrition needs 1638-1872 calories, 70-85 g protein, 2 L fluid.  Nutrition diagnosis: Inadequate oral intake continues.  Intervention: Provided navigator with 2 cases Osmolite 1.5 for patient to use in his feeding tube.  Navigator agrees to educate patient and wife on tube feeding administration.  Written instructions provided for slow advancement of bolus tube feeding to eventual goal.  Noted patient now being admitted secondary to lab work.  Patient is at risk for refeeding syndrome.  Will notify inpatient RD of patient's pending admission.  Monitoring, evaluation, goals: Patient will tolerate tube feeding advancements to provide 100% of estimated nutrition needs and minimize further weight loss.  Next visit: I will follow patient as needed.

## 2013-07-03 NOTE — Progress Notes (Signed)
Radiation Oncology         [phone]) [phone] ________________________________  Name: Adrian [name] MRN: [id number]  Date: 07/03/2013  DOB: 18-Jul-1950  Follow-Up Visit Note  CC: [name], MD  [name], MD  Diagnosis:   Squamous cell carcinoma of the epiglottis    Interval Since Last Radiation:  5  days  Narrative:  The patient returns today for an unscheduled followup. Patient is been taking in little liquids or solid foods by mouth. He complains of significant sore throat. He has not been using his Duragesic patch and is hesitant to use his morphine.   He feels dizzy and unsteady on his feet.  He has not been using his feeding tube well                  ALLERGIES:  has No Known Allergies.  Meds: Current Facility-Administered Medications  Medication Dose Route Frequency Provider Last Rate Last Dose  . silver sulfADIAZINE (SILVADENE) 1 % cream   Topical BID Blair Promise, MD       Current Outpatient Prescriptions  Medication Sig Dispense Refill  . cyanocobalamin 500 MCG tablet Take 500 mcg by mouth daily.      . diazepam (VALIUM) 5 MG tablet Take 5 mg by mouth every 6 (six) hours as needed for anxiety.      Marland Kitchen emollient (BIAFINE) cream Apply 1 application topically daily. Apply to affected skin area on neck after rad txs when skin becomes irritated ot itching,dryness daily      . Fe Fum-Vit C-Vit B12-FA (HEMATOGEN FORTE PO) Take 1 capsule by mouth daily.      . folic acid (FOLVITE) 1 MG tablet Place 1 tablet (1 mg total) into feeding tube daily.      . mometasone (NASONEX) 50 MCG/ACT nasal spray Place 2 sprays into both nostrils daily.      Marland Kitchen morphine (MS CONTIN) 15 MG 12 hr tablet Take 1 tablet (15 mg total) by mouth every 12 (twelve) hours.  60 tablet  0  . oxyCODONE-acetaminophen (PERCOCET) 5-325 MG per tablet Take 1 tablet by mouth every 6 (six) hours as needed for severe pain.  40 tablet  0  . phenytoin (DILANTIN) 100 MG ER capsule Take 200 mg by mouth 2 (two) times  daily.      . vitamin E 1000 UNIT capsule Take 500 Units by mouth daily.      . fentaNYL (DURAGESIC - DOSED MCG/HR) 25 MCG/HR patch Place 1 patch (25 mcg total) onto the skin every 3 (three) days.  5 patch  0  . fluconazole (DIFLUCAN) 100 MG tablet Take 1 tablet (100 mg total) by mouth daily.  8 tablet  0  . HYDROcodone-acetaminophen (HYCET) 7.5-325 mg/15 ml solution Take 10 mLs by mouth 4 (four) times daily as needed for moderate pain.  120 mL  0   Facility-Administered Medications Ordered in Other Encounters  Medication Dose Route Frequency Provider Last Rate Last Dose  . calcium gluconate 1 g in sodium chloride 0.9 % 100 mL IVPB  1 g Intravenous Once Robyn M Albert, PA-C      . dextrose 50 % solution 50 mL  50 mL Intravenous Once Robyn M Albert, PA-C      . insulin aspart (novoLOG) injection 5 Units  5 Units Intravenous Once Robyn M Albert, PA-C      . sodium bicarbonate injection 50 mEq  50 mEq Intravenous Once Illene Labrador, PA-C  Physical Findings: The patient is in no acute distress. Patient is alert and oriented.  height is 6' (1.829 m) and weight is 103 lb 1.6 oz (46.766 kg). His temperature is 97.5 F (36.4 C). His blood pressure is 79/61 and his pulse is 113. His oxygen saturation is 95%. .  The patient exhibits orthostatic blood pressure changes. He does not like to talk as this hurts his throat.  The oral cavity is somewhat dry without secondary infection.  The heart has a regular rhythm with increased rate. The lungs are clear to auscultation.  the neck area shows an area of eschar along the left anterior neck without obvious infection.  Some of this eschar was gently removed with a Q-tip   Lab Findings: Lab Results  Component Value Date   WBC 5.2 07/03/2013   HGB 9.9* 07/03/2013   HCT 29.0* 07/03/2013   MCV 112.6* 07/03/2013   PLT 243 07/03/2013     Radiographic Findings: No results found.  Impression:  Patient is dehydrated and will be set up for IV fluids. He will  also present to the lab routine blood work.  he may need additional IV fluid supplementation later this week.  Nutrition will be discussing increased free water to his feeding tube as well as increasing his nutritional intake through his feeding tube. Patient has been given a prescription for hycet and Silvadene.  Plan: The patient will likely return on Friday for additional fluid supplementation  ____________________________________ Blair Promise, MD

## 2013-07-04 ENCOUNTER — Encounter: Payer: Self-pay | Admitting: *Deleted

## 2013-07-04 ENCOUNTER — Telehealth: Payer: Self-pay | Admitting: Oncology

## 2013-07-04 DIAGNOSIS — G40909 Epilepsy, unspecified, not intractable, without status epilepticus: Secondary | ICD-10-CM

## 2013-07-04 LAB — COMPREHENSIVE METABOLIC PANEL
ALT: 30 U/L (ref 0–53)
AST: 45 U/L — ABNORMAL HIGH (ref 0–37)
Albumin: 2.7 g/dL — ABNORMAL LOW (ref 3.5–5.2)
Alkaline Phosphatase: 169 U/L — ABNORMAL HIGH (ref 39–117)
BILIRUBIN TOTAL: 0.3 mg/dL (ref 0.3–1.2)
BUN: 81 mg/dL — ABNORMAL HIGH (ref 6–23)
CALCIUM: 8.5 mg/dL (ref 8.4–10.5)
CO2: 22 meq/L (ref 19–32)
Chloride: 99 mEq/L (ref 96–112)
Creatinine, Ser: 0.86 mg/dL (ref 0.50–1.35)
GFR, EST NON AFRICAN AMERICAN: 90 mL/min — AB (ref >90)
GLUCOSE: 138 mg/dL — AB (ref 70–99)
POTASSIUM: 5.7 meq/L — AB (ref 3.7–5.3)
SODIUM: 137 meq/L (ref 137–147)
Total Protein: 8.3 g/dL (ref 6.0–8.3)

## 2013-07-04 LAB — BASIC METABOLIC PANEL
BUN: 61 mg/dL — ABNORMAL HIGH (ref 6–23)
CALCIUM: 8.9 mg/dL (ref 8.4–10.5)
CO2: 25 mEq/L (ref 19–32)
Chloride: 102 mEq/L (ref 96–112)
Creatinine, Ser: 0.92 mg/dL (ref 0.50–1.35)
GFR calc Af Amer: >90 mL/min (ref >90)
GFR, EST NON AFRICAN AMERICAN: 87 mL/min — AB (ref >90)
Glucose, Bld: 118 mg/dL — ABNORMAL HIGH (ref 70–99)
Potassium: 3.6 mEq/L — ABNORMAL LOW (ref 3.7–5.3)
Sodium: 143 mEq/L (ref 137–147)

## 2013-07-04 LAB — CBC
HCT: 25.1 % — ABNORMAL LOW (ref 39.0–52.0)
HEMOGLOBIN: 8.4 g/dL — AB (ref 13.0–17.0)
MCH: 38.5 pg — ABNORMAL HIGH (ref 26.0–34.0)
MCHC: 33.5 g/dL (ref 30.0–36.0)
MCV: 115.1 fL — ABNORMAL HIGH (ref 78.0–100.0)
Platelets: 236 10*3/uL (ref 150–400)
RBC: 2.18 MIL/uL — AB (ref 4.22–5.81)
RDW: 18.1 % — ABNORMAL HIGH (ref 11.5–15.5)
WBC: 16 10*3/uL — ABNORMAL HIGH (ref 4.0–10.5)

## 2013-07-04 LAB — MAGNESIUM: MAGNESIUM: 2.9 mg/dL — AB (ref 1.5–2.5)

## 2013-07-04 MED ORDER — SILVER SULFADIAZINE 1 % EX CREA
TOPICAL_CREAM | Freq: Two times a day (BID) | CUTANEOUS | Status: DC
  Administered 2013-07-04 (×2): via TOPICAL
  Administered 2013-07-05: 1 via TOPICAL
  Administered 2013-07-05 – 2013-07-08 (×6): via TOPICAL
  Administered 2013-07-08: 1 via TOPICAL
  Administered 2013-07-09: 10:00:00 via TOPICAL
  Filled 2013-07-04: qty 50

## 2013-07-04 MED ORDER — SODIUM POLYSTYRENE SULFONATE 15 GM/60ML PO SUSP
15.0000 g | Freq: Once | ORAL | Status: AC
  Administered 2013-07-04: 15 g
  Filled 2013-07-04: qty 60

## 2013-07-04 MED ORDER — FLUCONAZOLE 40 MG/ML PO SUSR
100.0000 mg | Freq: Every day | ORAL | Status: DC
  Filled 2013-07-04: qty 2.5

## 2013-07-04 MED ORDER — FLUCONAZOLE 40 MG/ML PO SUSR
100.0000 mg | Freq: Every day | ORAL | Status: DC
  Administered 2013-07-04 – 2013-07-09 (×6): 100 mg
  Filled 2013-07-04 (×7): qty 2.5

## 2013-07-04 MED ORDER — SILVER SULFADIAZINE 1 % EX CREA
TOPICAL_CREAM | Freq: Two times a day (BID) | CUTANEOUS | Status: DC
  Filled 2013-07-04: qty 85

## 2013-07-04 MED ORDER — PHENYTOIN 125 MG/5ML PO SUSP
200.0000 mg | Freq: Two times a day (BID) | ORAL | Status: DC
  Administered 2013-07-04 – 2013-07-09 (×11): 200 mg
  Filled 2013-07-04 (×13): qty 8

## 2013-07-04 MED ORDER — SODIUM POLYSTYRENE SULFONATE 15 GM/60ML PO SUSP
30.0000 g | Freq: Once | ORAL | Status: AC
  Administered 2013-07-04: 30 g
  Filled 2013-07-04: qty 120

## 2013-07-04 MED ORDER — OSMOLITE 1.2 CAL PO LIQD
1000.0000 mL | ORAL | Status: DC
  Filled 2013-07-04 (×4): qty 1000

## 2013-07-04 NOTE — Progress Notes (Signed)
Pt had 7 beats V tach.  Asymptomatic. MD notified.

## 2013-07-04 NOTE — Progress Notes (Signed)
Visited patient

## 2013-07-04 NOTE — Progress Notes (Signed)
Pt K+ level 5.8 yesterday, given 30 gms kayexalate via PEG and had 2 large BM. K+ level this am 5.7. On call NP paged and new order received- kayexalate 15 gms via PEG stat. Will carry out order.

## 2013-07-04 NOTE — ED Provider Notes (Signed)
Medical screening examination/treatment/procedure(s) were performed by non-physician practitioner and as supervising physician I was immediately available for consultation/collaboration.     Date: 07/04/2013  Rate: 79  Rhythm: normal sinus rhythm  QRS Axis: normal  Intervals: normal  ST/T Wave abnormalities: normal  Conduction Disutrbances:none  Narrative Interpretation: No ST or T wave changes consistent with ischemia. No peaked t waves.  Old EKG Reviewed: unchanged    Blanchard Kelch, MD 07/04/13 [phone]

## 2013-07-04 NOTE — Care Management Note (Addendum)
    Page 1 of 1   07/09/2013     12:15:20 PM   CARE MANAGEMENT NOTE 07/09/2013  Patient:  Adrian [name], Adrian [name]   Account Number:  [id number]  Date Initiated:  07/04/2013  Documentation initiated by:  Dessa Phi  Subjective/Objective Assessment:   53 [place].HX:PEG.USES ENSURE FOR PEG.     Action/Plan:   FROM HOME W/SPOUSE.HAS CANE,PCP,PHARMACY.   Anticipated DC Date:  07/09/2013   Anticipated DC Plan:  [place]  CM consult      Choice offered to / List presented to:             Status of service:  Completed, signed off Medicare Important Message given?   (If response is "NO", the following Medicare IM given date fields will be blank) Date Medicare IM given:   Date Additional Medicare IM given:    Discharge Disposition:  HOME/SELF CARE  Per UR Regulation:  Reviewed for med. necessity/level of care/duration of stay  If discussed at [place] of Stay Meetings, dates discussed:   07/09/2013    Comments:  07/08/13 Olney Monier RN,BSN NCM [phone] UTI-IV ABX,AWAIT URINE CX.SPOUSE INDEP W/PEG. NO ANTICIPATED D/C NEEDS.  07/04/13 Jacayla Nordell RN,BSN NCM [phone] SPOUSE INDEPENDENT IN PEG TUBE FEEDINGS.TC [facility] TO CHECK ON ENSURE COST FOR PATIENT.PATIENT NOT UNDER [facility] SERVICES,UNABLE TO GET ENSURE FROM THEIR SERVICES.SPOUSE INFORMED,& APPRECIATED ASSISTANCE.

## 2013-07-04 NOTE — Progress Notes (Signed)
[facility] PROGRESS NOTE  Adrian [name] AOZ:[id number] DOB: 1950/12/30 DOA: 07/03/2013 PCP: [name], MD  Assessment/Plan: Hyperkalemia  Improved after kayexalate, will give one more dose of Kayexalate.  NSVT Patient had 7 beat run of NSVT. Will check magnesium level.   Uremia  Improved after starting the IV fluids. Patient does not have mental status changes at this time.   Severe protein calorie malnutrition  Appreciate  nutrition consult, started on osmolite at 30 ml/hr, will check Phosphorus, magnesium and CMP in am for possible refeeding syndrome.  Anemia  Secondary to anemia of chronic disease  Fecal occult blood is positive. H&H is stable, will follow.  Called and discussed with Dr Carol Ada, who at this time recommends to observe as he has multiple other problems. If Hb starts to drop or he develops melena/hematochezia, then will call Dr Benson Norway. No procedures at this time.  Seizure disorder  Continue Dilantin    Code Status: Full code Family Communication: No  Family at bedside Disposition Plan: Home when stable   Consultants:  None  Procedures:  None  Antibiotics:  None  HPI/Subjective: Patient admitted with severe malnutrition and hyperkalemia, dehydration. Started on the tube feedings, IV fluids.  Objective: Filed Vitals:   07/04/13 1344  BP: 99/56  Pulse: 94  Temp: 97.7 F (36.5 C)  Resp: 20    Intake/Output Summary (Last 24 hours) at 07/04/13 1450 Last data filed at 07/04/13 0521  Gross per 24 hour  Intake 1105.25 ml  Output    575 ml  Net 530.25 ml   Filed Weights   07/03/13 1819  Weight: 45.995 kg (101 lb 6.4 oz)    Exam:        Physical Exam: Head: Normocephalic, atraumatic.  Eyes: No signs of jaundice, EOMI Nose: Mucous membranes dry.  Throat: Oropharynx nonerythematous, no exudate appreciated.  Neck: supple,No deformities, masses, or tenderness noted. Lungs: Normal respiratory effort. B/L Clear to  auscultation, no crackles or wheezes.  Heart: Regular RR. S1 and S2 normal  Abdomen: BS normoactive. Soft, Nondistended, non-tender.  Extremities: No pretibial edema, no erythema   Data Reviewed: Basic Metabolic Panel:  Recent Labs Lab 07/03/13 1148 07/03/13 1546 07/03/13 1555 07/04/13 0435  NA 134* 131* 133* 137  K 6.0 Repeated and Verified, no visible hemolysis* 6.1* 5.8* 5.7*  CL  --  94* 101 99  CO2 21* 21  --  22  GLUCOSE 127 159* 160* 138*  BUN 103.2* 106* 121* 81*  CREATININE 1.4* 1.30 1.40* 0.86  CALCIUM 9.7 9.3  --  8.5   Liver Function Tests:  Recent Labs Lab 07/03/13 1546 07/04/13 0435  AST 19 45*  ALT 22 30  ALKPHOS 195* 169*  BILITOT 0.2* 0.3  PROT 9.3* 8.3  ALBUMIN 3.2* 2.7*   No results found for this basename: LIPASE, AMYLASE,  in the last 168 hours No results found for this basename: AMMONIA,  in the last 168 hours CBC:  Recent Labs Lab 07/03/13 1148 07/03/13 1546 07/03/13 1555 07/04/13 0435  WBC 6.7 5.2  --  16.0*  NEUTROABS 5.4  --   --   --   HGB 9.7* 8.4* 9.9* 8.4*  HCT 28.8* 25.0* 29.0* 25.1*  MCV 116.2* 112.6*  --  115.1*  PLT 258 243  --  236   Cardiac Enzymes: No results found for this basename: CKTOTAL, CKMB, CKMBINDEX, TROPONINI,  in the last 168 hours BNP (last 3 results)  Recent Labs  01/23/13 0059 01/25/13 0500  PROBNP 2575.0* 4987.0*   CBG: No results found for this basename: GLUCAP,  in the last 168 hours  No results found for this or any previous visit (from the past 240 hour(s)).   Studies: Dg Chest 2 View  07/03/2013   CLINICAL DATA:  Shortness of breath and weakness. History of laryngeal and epiglottic cancer.  EXAM: CHEST  2 VIEW  COMPARISON:  April 15, 2013  FINDINGS: The heart size and mediastinal contours are within normal limits. Stable asymmetric scarring is identified in the right apex unchanged compared to prior chest x-ray. There is no pulmonary edema or pleural effusion. The visualized skeletal  structures are stable.  IMPRESSION: No active cardiopulmonary disease.   Electronically Signed   By: Abelardo Diesel M.D.   On: 07/03/2013 16:40    Scheduled Meds: . enoxaparin (LOVENOX) injection  30 mg Subcutaneous Q24H  . fentaNYL  25 mcg Transdermal Q72H  . fluconazole  100 mg Per Tube Daily  . free water  200 mL Per Tube 3 times per day  . phenytoin  200 mg Per Tube BID  . silver sulfADIAZINE   Topical BID  . sodium chloride  3 mL Intravenous Q12H   Continuous Infusions: . sodium chloride 75 mL/hr at 07/04/13 1016  . feeding supplement (OSMOLITE 1.2 CAL) 1,000 mL (07/04/13 1445)    Principal Problem:   Hyperkalemia Active Problems:   Anemia, unspecified   MDS (myelodysplastic syndrome), low grade   Seizure disorder   Protein-calorie malnutrition, severe   Squamous cell carcinoma of the epiglottis    Time spent: 25  min    [facility] Pager [phone]. If 7PM-7AM, please contact night-coverage at [email or web address], password [facility] 07/04/2013, 2:50 PM  LOS: 1 day

## 2013-07-04 NOTE — Progress Notes (Signed)
INITIAL NUTRITION ASSESSMENT  DOCUMENTATION CODES Per approved criteria  -Severe malnutrition in the context of chronic illness -Underweight  Pt meets criteria for severe MALNUTRITION in the context of chronic illness as evidenced by severe muscle wasting and fat loss, and 17% weight loss in one month, and <75% est nutrition needs intake for > one month.  INTERVENTION: -Initiate Osmolite 1.2 @ 30 ml/hr via Gtube and increase by 10 ml every 10 hours to goal rate of 65 ml/hr. At goal rate, tube feeding regimen will provide 1872 kcal, 87 grams of protein, and 1279 ml of H2O. -Recommend refeeding labs for 48 hrs and conservative advancement to goal rate -Transition to bolus feed regimen of 6 cans/day as tolerated -Will continue to monitor  NUTRITION DIAGNOSIS: Unintentional wt loss related to inadequate energy intake  as evidenced by 20 lbs wt loss in one month, <50% intake of nutrition needs.   Goal: TF  to meet >/= 90% of their estimated nutrition needs   Monitor:  TF tolerance, Renal profile, total protein/energy intake, labs, weights  Reason for Assessment: Consult/MST/Underweight BMI  63 y.o. male  Admitting Dx: Hyperkalemia  ASSESSMENT: 89 your old male who was recently diagnosed with squamous cell carcinoma of the epiglottis, status post 9 weeks of radiation treatment which ended last week on Friday today went to oncology clinic for blood work, where he was found to have abnormal labs with high potassium and sent to ED for further evaluation. Patient has not been eating and drinking well, he does have a PEG tube in placed in September last year. His wife has been trying to give him milk through the PEG tube. Patient has severe protein calorie malnutrition and has lost significant amount of weight.  -Outpatient RD recommended pt consume 6 cans of Ensure plus daily to provide 2130 kcal, 89 gram protein -Pt reported taking 6 cans/day; however per MD notes from discussion with wife,  pt only taking in 2 cans of Ensure Plus/day. This provides 700 kcal, and 26 gram protein. Wife also giving pt milk occasionally via PEG. Pt does not take any PO or fluid orally. Reported tolerating bolus feeds w/out nausea/vomiting or abd pain.  -Outpatient RD provided pt with 2 cases of Osmolite 1.5 on 2/25 as pt was in need of additional nutrition supplements.  Continue to recommend goal rate of 6 cans/daily. Will recommend to transition to bolus feeds once pt is tolerating goal rate of continuous feeds  -Weight continues to decline. Has lost 20 lbs in one month per previous medical records -Severe dehydration and ARF, elevated BUN, Crt and K. Pt denied any water flushes via PEG. RN has encouraged fluid intake of 64 oz daily with wife and pt -High risk of refeeding d/t prolonged period of suboptimal nutrition. Recommend conservative advancement to goal and refeeding labs be run for 48 hrs Nutrition Focused Physical Exam:  Subcutaneous Fat:  Orbital Region: severe Upper Arm Region: severe Thoracic and Lumbar Region: severe  Muscle:  Temple Region: severe Clavicle Bone Region: severe Clavicle and Acromion Bone Region: severe Scapular Bone Region: severe Dorsal Hand: severe Patellar Region: severe Anterior Thigh Region: severe Posterior Calf Region: severe  Edema: n/a    Height: Ht Readings from Last 1 Encounters:  07/03/13 6' (1.829 m)    Weight: Wt Readings from Last 1 Encounters:  07/03/13 101 lb 6.4 oz (45.995 kg)    Ideal Body Weight: 178 lbs  % Ideal Body Weight: 57%  Wt Readings from Last 10 Encounters:  07/03/13 101 lb 6.4 oz (45.995 kg)  06/27/13 109 lb 3.2 oz (49.533 kg)  06/25/13 111 lb 8 oz (50.576 kg)  06/18/13 116 lb 9.6 oz (52.889 kg)  06/13/13 113 lb 9.6 oz (51.529 kg)  06/11/13 118 lb 4.8 oz (53.661 kg)  06/04/13 122 lb 11.2 oz (55.656 kg)  05/30/13 117 lb 1.6 oz (53.116 kg)  05/28/13 120 lb 9.6 oz (54.704 kg)  05/23/13 118 lb 9.6 oz (53.797 kg)     Usual Body Weight: 120 lbs per previous medical records  % Usual Body Weight: 84%  BMI:  Body mass index is 13.75 kg/(m^2). Underweight  Estimated Nutritional Needs: Kcal: 1650-1850  Protein: 70-85 gram Fluid: 2000 ml/daily  Skin: WDL  Diet Order: NPO  EDUCATION NEEDS: -No education needs identified at this time   Intake/Output Summary (Last 24 hours) at 07/04/13 1042 Last data filed at 07/04/13 0521  Gross per 24 hour  Intake 1105.25 ml  Output    575 ml  Net 530.25 ml    Last BM: 2/26   Labs:   Recent Labs Lab 07/03/13 1148 07/03/13 1546 07/03/13 1555 07/04/13 0435  NA 134* 131* 133* 137  K 6.0 Repeated and Verified, no visible hemolysis* 6.1* 5.8* 5.7*  CL  --  94* 101 99  CO2 21* 21  --  22  BUN 103.2* 106* 121* 81*  CREATININE 1.4* 1.30 1.40* 0.86  CALCIUM 9.7 9.3  --  8.5  GLUCOSE 127 159* 160* 138*    CBG (last 3)  No results found for this basename: GLUCAP,  in the last 72 hours  Scheduled Meds: . enoxaparin (LOVENOX) injection  30 mg Subcutaneous Q24H  . fentaNYL  25 mcg Transdermal Q72H  . fluconazole  100 mg Oral Daily  . free water  200 mL Per Tube 3 times per day  . phenytoin  200 mg Oral BID  . sodium chloride  3 mL Intravenous Q12H    Continuous Infusions: . sodium chloride 75 mL/hr at 07/04/13 1016  . feeding supplement (OSMOLITE 1.2 CAL) 1,000 mL (07/04/13 0521)    Past Medical History  Diagnosis Date  . Megaloblastic anemia   . Emphysema of lung   . Arthritis     rheumatoid  . Esophageal reflux   . Anxiety   . Shortness of breath     with exertion  . Pneumonia 01/2013  . Seizures     last one was 8 years ago  . History of blood transfusion   . Cancer 03/11/2013    larnyx/epiglottic Squamous cell in situ  . Throat pain 06/13/2013    Past Surgical History  Procedure Laterality Date  . Abdominal hernia repair    . Tracheostomy  2014  . Peg placement  2014  . Colon surgery    . Multiple extractions with  alveoloplasty N/A 04/16/2013    Procedure: Extraction of tooth #'s 2,5,6,7,10,11,15,17,21,22,27,29 with alveoloplasty, bilateral maxillary fibrous tuberosity reductions, removal of excess tissues of mandibular arch, and mandibular left lingual exostoses reductions.;  Surgeon: Lenn Cal, DDS;  Location: [facility];  Service: Oral Surgery;  Laterality: N/A;    Atlee Abide MS RD LDN Clinical Dietitian [phone]

## 2013-07-04 NOTE — Telephone Encounter (Signed)
Tillie Rung, RN, Mr. Pearman's nurse.  Advised her that he has Silvadene that should be applied twice a day to the scabbed/open area on his neck.  Gregary Signs said she would contact the hospitalist to see if an order needs to be put in or if Adrian [name] can use his own container of Silvadene that was given to him yesterday.

## 2013-07-04 NOTE — Telephone Encounter (Signed)
error 

## 2013-07-04 NOTE — Addendum Note (Signed)
Encounter addended by: Jacqulyn Liner, RN on: 07/04/2013 12:53 PM<BR>     Documentation filed: Charges VN

## 2013-07-04 NOTE — Consult Note (Signed)
WOC wound consult note Reason for Consult:Thermal injury at anterior neck Wound type:thermal injury Pressure Ulcer POA: Yes Measurement:3cm x 4cm, 0.2cm depth at periphery.  Elevated scab (dried serum) in center Wound ETK:[id number] thickness denudation at anterior nect with deeper area that has heel and scabbed in center. Drainage (amount, consistency, odor) none Periwound:As described above Dressing procedure/placement/frequency:[facility] had prescribed silvadene cream to be applied daily yesterday and we will continue those orders here in house. [place] nursing team will not follow, but will remain available to this patient, the nursing and medical team.  Please re-consult if needed. Thanks, Maudie Flakes, MSN, RN, [place], [place], [place] [phone])

## 2013-07-04 NOTE — Clinical Documentation Improvement (Signed)
Noted in progress notes "Uremia -Improved after starting the IV fluids", Hyperkalemia, dehydration   Please clarify Nilda Riggs Clinical Conditions?                                   Other Condition___________________                 Cannot Clinically Determine_________   Supporting Information: Risk Factors:  Component     Latest Ref Rng 07/03/2013 07/04/2013         3:55 PM   Sodium     137 - 147 mEq/L 133 (L) 137  Potassium     3.7 - 5.3 mEq/L 5.8 (H) 5.7 (H)  Glucose     70 - 99 mg/dL 160 (H) 138 (H)  BUN     6 - 23 mg/dL 121 (H) 81 (H)  Creatinine     0.50 - 1.35 mg/dL 1.40 (H) 0.86  GFR calc non Af Amer     >90 mL/min  90 (L)  GFR calc Af Amer     >90 mL/min  >90    Treatment: NS infusion @ 75/ml hr In ED pt given calcium gluconate, insulin, D50 and bicarb  Thank You, Philippa Chester ,RN Clinical Documentation Specialist:  [place] Information Management

## 2013-07-05 ENCOUNTER — Encounter: Payer: Self-pay | Admitting: *Deleted

## 2013-07-05 DIAGNOSIS — E875 Hyperkalemia: Secondary | ICD-10-CM

## 2013-07-05 DIAGNOSIS — D649 Anemia, unspecified: Secondary | ICD-10-CM

## 2013-07-05 DIAGNOSIS — G40909 Epilepsy, unspecified, not intractable, without status epilepticus: Secondary | ICD-10-CM

## 2013-07-05 LAB — COMPREHENSIVE METABOLIC PANEL
ALBUMIN: 2.5 g/dL — AB (ref 3.5–5.2)
ALT: 20 U/L (ref 0–53)
AST: 20 U/L (ref 0–37)
Alkaline Phosphatase: 146 U/L — ABNORMAL HIGH (ref 39–117)
BUN: 46 mg/dL — ABNORMAL HIGH (ref 6–23)
CALCIUM: 8.4 mg/dL (ref 8.4–10.5)
CO2: 25 mEq/L (ref 19–32)
CREATININE: 0.76 mg/dL (ref 0.50–1.35)
Chloride: 104 mEq/L (ref 96–112)
GFR calc Af Amer: >90 mL/min (ref >90)
GFR calc non Af Amer: >90 mL/min (ref >90)
Glucose, Bld: 124 mg/dL — ABNORMAL HIGH (ref 70–99)
Potassium: 3 mEq/L — ABNORMAL LOW (ref 3.7–5.3)
Sodium: 142 mEq/L (ref 137–147)
Total Bilirubin: <0.2 mg/dL — ABNORMAL LOW (ref 0.3–1.2)
Total Protein: 7.4 g/dL (ref 6.0–8.3)

## 2013-07-05 LAB — CBC
HCT: 24.3 % — ABNORMAL LOW (ref 39.0–52.0)
HEMOGLOBIN: 7.9 g/dL — AB (ref 13.0–17.0)
MCH: 38.2 pg — ABNORMAL HIGH (ref 26.0–34.0)
MCHC: 32.5 g/dL (ref 30.0–36.0)
MCV: 117.4 fL — ABNORMAL HIGH (ref 78.0–100.0)
PLATELETS: 208 10*3/uL (ref 150–400)
RBC: 2.07 MIL/uL — ABNORMAL LOW (ref 4.22–5.81)
RDW: 17.8 % — ABNORMAL HIGH (ref 11.5–15.5)
WBC: 7.6 10*3/uL (ref 4.0–10.5)

## 2013-07-05 LAB — PHOSPHORUS: PHOSPHORUS: 3.8 mg/dL (ref 2.3–4.6)

## 2013-07-05 MED ORDER — POTASSIUM CHLORIDE 10 MEQ/100ML IV SOLN
10.0000 meq | INTRAVENOUS | Status: AC
  Administered 2013-07-05 (×3): 10 meq via INTRAVENOUS
  Filled 2013-07-05 (×3): qty 100

## 2013-07-05 NOTE — Progress Notes (Signed)
[facility] PROGRESS NOTE  Adrian [name] LKG:[id number] DOB: [birth year] DOA: 07/03/2013 PCP: [name], MD  Assessment/Plan:  Hypokalemia Will replace the potassium Check BMP in am.  NSVT Patient had 7 beat run of NSVT. Magnesium level is normal   Uremia  Improved after starting the IV fluids. Patient does not have mental status changes at this time.   Severe protein calorie malnutrition  Appreciate  nutrition consult, started on osmolite at 30 ml/hr, will check Phosphorus, magnesium and CMP in am for possible refeeding syndrome.  Anemia  Secondary to anemia of chronic disease  Fecal occult blood is positive. H&H is stable, will follow.   Called and discussed with Dr Carol Ada, who at this time recommends to observe as he has multiple other problems. If Hb starts to drop or he develops melena/hematochezia, then will call Dr Benson Norway. No procedures at this time.  Seizure disorder  Continue Dilantin    Code Status: Full code Family Communication: No  Family at bedside Disposition Plan: Home when stable   Consultants:  None  Procedures:  None  Antibiotics:  None  HPI/Subjective: Patient admitted with severe malnutrition and hyperkalemia, dehydration. Started on the tube feedings, IV fluids. Has low potassium.  Objective: Filed Vitals:   07/05/13 0559  BP: 115/55  Pulse: 69  Temp: 98.2 F (36.8 C)  Resp: 16    Intake/Output Summary (Last 24 hours) at 07/05/13 1113 Last data filed at 07/05/13 1036  Gross per 24 hour  Intake 3259.92 ml  Output   1640 ml  Net 1619.92 ml   Filed Weights   07/03/13 1819  Weight: 45.995 kg (101 lb 6.4 oz)    Exam:        Physical Exam: Head: Normocephalic, atraumatic.  Eyes: No signs of jaundice, EOMI Nose: Mucous membranes dry.  Throat: Oropharynx nonerythematous, no exudate appreciated.  Neck: supple,No deformities, masses, or tenderness noted. Lungs: Normal respiratory effort. B/L Clear to  auscultation, no crackles or wheezes.  Heart: Regular RR. S1 and S2 normal  Abdomen: BS normoactive. Soft, Nondistended, non-tender.  Extremities: No pretibial edema, no erythema   Data Reviewed: Basic Metabolic Panel:  Recent Labs Lab 07/03/13 1148 07/03/13 1546 07/03/13 1555 07/04/13 0435 07/04/13 1530 07/05/13 0350  NA 134* 131* 133* 137 143 142  K 6.0 Repeated and Verified, no visible hemolysis* 6.1* 5.8* 5.7* 3.6* 3.0*  CL  --  94* 101 99 102 104  CO2 21* 21  --  22 25 25   GLUCOSE 127 159* 160* 138* 118* 124*  BUN 103.2* 106* 121* 81* 61* 46*  CREATININE 1.4* 1.30 1.40* 0.86 0.92 0.76  CALCIUM 9.7 9.3  --  8.5 8.9 8.4  MG  --   --   --   --  2.9*  --   PHOS  --   --   --   --   --  3.8   Liver Function Tests:  Recent Labs Lab 07/03/13 1546 07/04/13 0435 07/05/13 0350  AST 19 45* 20  ALT 22 30 20   ALKPHOS 195* 169* 146*  BILITOT 0.2* 0.3 <0.2*  PROT 9.3* 8.3 7.4  ALBUMIN 3.2* 2.7* 2.5*   No results found for this basename: LIPASE, AMYLASE,  in the last 168 hours No results found for this basename: AMMONIA,  in the last 168 hours CBC:  Recent Labs Lab 07/03/13 1148 07/03/13 1546 07/03/13 1555 07/04/13 0435 07/05/13 0350  WBC 6.7 5.2  --  16.0* 7.6  NEUTROABS 5.4  --   --   --   --  HGB 9.7* 8.4* 9.9* 8.4* 7.9*  HCT 28.8* 25.0* 29.0* 25.1* 24.3*  MCV 116.2* 112.6*  --  115.1* 117.4*  PLT 258 243  --  236 208   Cardiac Enzymes: No results found for this basename: CKTOTAL, CKMB, CKMBINDEX, TROPONINI,  in the last 168 hours BNP (last 3 results)  Recent Labs  01/23/13 0059 01/25/13 0500  PROBNP 2575.0* 4987.0*   CBG: No results found for this basename: GLUCAP,  in the last 168 hours  No results found for this or any previous visit (from the past 240 hour(s)).   Studies: Dg Chest 2 View  07/03/2013   CLINICAL DATA:  Shortness of breath and weakness. History of laryngeal and epiglottic cancer.  EXAM: CHEST  2 VIEW  COMPARISON:  April 15, 2013  FINDINGS: The heart size and mediastinal contours are within normal limits. Stable asymmetric scarring is identified in the right apex unchanged compared to prior chest x-ray. There is no pulmonary edema or pleural effusion. The visualized skeletal structures are stable.  IMPRESSION: No active cardiopulmonary disease.   Electronically Signed   By: Abelardo Diesel M.D.   On: 07/03/2013 16:40    Scheduled Meds: . enoxaparin (LOVENOX) injection  30 mg Subcutaneous Q24H  . fentaNYL  25 mcg Transdermal Q72H  . fluconazole  100 mg Per Tube Daily  . free water  200 mL Per Tube 3 times per day  . phenytoin  200 mg Per Tube BID  . potassium chloride  10 mEq Intravenous Q1 Hr x 3  . silver sulfADIAZINE   Topical BID  . sodium chloride  3 mL Intravenous Q12H   Continuous Infusions: . sodium chloride 75 mL/hr at 07/05/13 1112  . feeding supplement (OSMOLITE 1.2 CAL) 1,000 mL (07/05/13 0551)    Principal Problem:   Hyperkalemia Active Problems:   Anemia, unspecified   MDS (myelodysplastic syndrome), low grade   Seizure disorder   Protein-calorie malnutrition, severe   Squamous cell carcinoma of the epiglottis    Time spent: 25  min    [facility] Pager [phone]. If 7PM-7AM, please contact night-coverage at [email or web address], password [facility] 07/05/2013, 11:13 AM  LOS: 2 days

## 2013-07-05 NOTE — Progress Notes (Signed)
Nutrition BriefFollow-up  Intervention:   - Continue to advance Osmolite 1.2 to goal rate of 65 ml/hr, increasing by 10 ml/hr every 10 hours.  - Recommend refeeding labs for 24 hours, and conservative advancement to goal rate.  - Transition to bolus feed regimen of 6 cans/day as tolerated.   Assessment:   Patient at risk for refeeding syndrome. Receiving  Osmolite 1.2, last rate at 50 ml/hr. With 200 ml free water TID. It is currently being held due to Dilantin dose. Plan to resume at 1400. Patient tolerating well, no residuals. Plan to advance to goal rate of 65 ml/hr after 10 hours. At goal, will provide 1872 kcal, 87 g protein, 1279 ml free water.   Potassium low, plans to replete, Mg elevated, Phosphorus WNL.    Larey Seat, RD, LDN Pager #: [phone] After-Hours Pager #: [phone]

## 2013-07-05 NOTE — Progress Notes (Signed)
Visited patient on [facility] 1407.  Pt stated he is feeling better, he was actively eating ice chips.  He expressed concern that he has been told that he may be discharge tomorrow.  I assured him that the MD will not discharge him unless his reason for admission has been adequately corrected for his safe return.  In response to my inquiry, he stated that he has not been out of bed to walk b/c of the condom catheter.  I explained that his safe ambulation will be addressed.  I later spoke with his wife at home and provided her a update.  She expressed appreciation.  Continuing to navigate as L3 (treatments completed) patient.  Gayleen Orem, RN, BSN, [facility] Head [facility] Neck Oncology Navigator [phone]

## 2013-07-06 DIAGNOSIS — E875 Hyperkalemia: Secondary | ICD-10-CM

## 2013-07-06 DIAGNOSIS — D649 Anemia, unspecified: Secondary | ICD-10-CM

## 2013-07-06 DIAGNOSIS — G40909 Epilepsy, unspecified, not intractable, without status epilepticus: Secondary | ICD-10-CM

## 2013-07-06 LAB — BASIC METABOLIC PANEL
BUN: 29 mg/dL — AB (ref 6–23)
CHLORIDE: 105 meq/L (ref 96–112)
CO2: 26 meq/L (ref 19–32)
CREATININE: 0.71 mg/dL (ref 0.50–1.35)
Calcium: 8.6 mg/dL (ref 8.4–10.5)
GFR calc Af Amer: >90 mL/min (ref >90)
GFR calc non Af Amer: >90 mL/min (ref >90)
GLUCOSE: 107 mg/dL — AB (ref 70–99)
POTASSIUM: 3.6 meq/L — AB (ref 3.7–5.3)
Sodium: 142 mEq/L (ref 137–147)

## 2013-07-06 LAB — URINALYSIS, ROUTINE W REFLEX MICROSCOPIC
BILIRUBIN URINE: NEGATIVE
GLUCOSE, UA: NEGATIVE mg/dL
KETONES UR: NEGATIVE mg/dL
Nitrite: POSITIVE — AB
PH: 6.5 (ref 5.0–8.0)
Protein, ur: 30 mg/dL — AB
SPECIFIC GRAVITY, URINE: 1.016 (ref 1.005–1.030)
Urobilinogen, UA: 1 mg/dL (ref 0.0–1.0)

## 2013-07-06 LAB — URINE MICROSCOPIC-ADD ON

## 2013-07-06 MED ORDER — DEXTROSE 5 % IV SOLN
1.0000 g | INTRAVENOUS | Status: DC
  Administered 2013-07-06 – 2013-07-08 (×3): 1 g via INTRAVENOUS
  Filled 2013-07-06 (×4): qty 10

## 2013-07-06 MED ORDER — FREE WATER
200.0000 mL | Freq: Four times a day (QID) | Status: DC
  Administered 2013-07-06 – 2013-07-09 (×12): 200 mL

## 2013-07-06 MED ORDER — OSMOLITE 1.2 CAL PO LIQD
237.0000 mL | ORAL | Status: DC
  Administered 2013-07-06 – 2013-07-09 (×15): 237 mL
  Filled 2013-07-06 (×22): qty 237

## 2013-07-06 NOTE — Progress Notes (Signed)
[facility] PROGRESS NOTE  Adrian [name] DJS:[id number] DOB: 28-Feb-1951 DOA: 07/03/2013 PCP: [name], MD  Assessment/Plan:  UTI Patient has abnormal UA, will obtain urine culture. Will start the Rocephin empirically and await the urine culture results.  Hypokalemia Replete  NSVT Patient had 7 beat run of NSVT.  Resolved, Magnesium level is normal   Uremia  Improved after starting the IV fluids. Patient does not have mental status changes at this time.   Severe protein calorie malnutrition  Appreciate  nutrition consult, started on osmolite at 30 ml/hr, will check Phosphorus, magnesium and CMP in am for possible refeeding syndrome. Will change to Osmolite 1.2 cal , 6 cans a day.  Anemia  Secondary to anemia of chronic disease  Fecal occult blood is positive. H&H is stable, will follow.   Called and discussed with Dr Carol Ada, who at this time recommends to observe as he has multiple other problems. If Hb starts to drop or he develops melena/hematochezia, then will call Dr Benson Norway. No procedures at this time.  Seizure disorder  Continue Dilantin    Code Status: Full code Family Communication: No  Family at bedside Disposition Plan: Home when stable   Consultants:  None  Procedures:  None  Antibiotics:  None  HPI/Subjective: Patient admitted with severe malnutrition and hyperkalemia, dehydration. Started on the tube feedings, IV fluids. Has low potassium. Today complains of some dysuria, he has elevated WBC.  Objective: Filed Vitals:   07/06/13 0622  BP: 100/59  Pulse: 80  Temp: 98.4 F (36.9 C)  Resp: 16    Intake/Output Summary (Last 24 hours) at 07/06/13 1149 Last data filed at 07/06/13 0653  Gross per 24 hour  Intake 1687.33 ml  Output    735 ml  Net 952.33 ml   Filed Weights   07/03/13 1819  Weight: 45.995 kg (101 lb 6.4 oz)    Exam:        Physical Exam: Head: Normocephalic, atraumatic.  Eyes: No signs of jaundice,  EOMI Nose: Mucous membranes dry.  Throat: Oropharynx nonerythematous, no exudate appreciated.  Neck: supple,No deformities, masses, or tenderness noted. Lungs: Normal respiratory effort. B/L Clear to auscultation, no crackles or wheezes.  Heart: Regular RR. S1 and S2 normal  Abdomen: BS normoactive. Soft, Nondistended, non-tender.  Extremities: No pretibial edema, no erythema   Data Reviewed: Basic Metabolic Panel:  Recent Labs Lab 07/03/13 1546 07/03/13 1555 07/04/13 0435 07/04/13 1530 07/05/13 0350 07/06/13 0512  NA 131* 133* 137 143 142 142  K 6.1* 5.8* 5.7* 3.6* 3.0* 3.6*  CL 94* 101 99 102 104 105  CO2 21  --  22 25 25 26   GLUCOSE 159* 160* 138* 118* 124* 107*  BUN 106* 121* 81* 61* 46* 29*  CREATININE 1.30 1.40* 0.86 0.92 0.76 0.71  CALCIUM 9.3  --  8.5 8.9 8.4 8.6  MG  --   --   --  2.9*  --   --   PHOS  --   --   --   --  3.8  --    Liver Function Tests:  Recent Labs Lab 07/03/13 1546 07/04/13 0435 07/05/13 0350  AST 19 45* 20  ALT 22 30 20   ALKPHOS 195* 169* 146*  BILITOT 0.2* 0.3 <0.2*  PROT 9.3* 8.3 7.4  ALBUMIN 3.2* 2.7* 2.5*   No results found for this basename: LIPASE, AMYLASE,  in the last 168 hours No results found for this basename: AMMONIA,  in the last 168 hours  CBC:  Recent Labs Lab 07/03/13 1148 07/03/13 1546 07/03/13 1555 07/04/13 0435 07/05/13 0350  WBC 6.7 5.2  --  16.0* 7.6  NEUTROABS 5.4  --   --   --   --   HGB 9.7* 8.4* 9.9* 8.4* 7.9*  HCT 28.8* 25.0* 29.0* 25.1* 24.3*  MCV 116.2* 112.6*  --  115.1* 117.4*  PLT 258 243  --  236 208   Cardiac Enzymes: No results found for this basename: CKTOTAL, CKMB, CKMBINDEX, TROPONINI,  in the last 168 hours BNP (last 3 results)  Recent Labs  01/23/13 0059 01/25/13 0500  PROBNP 2575.0* 4987.0*   CBG: No results found for this basename: GLUCAP,  in the last 168 hours  No results found for this or any previous visit (from the past 240 hour(s)).   Studies: No results  found.  Scheduled Meds: . cefTRIAXone (ROCEPHIN)  IV  1 g Intravenous Q24H  . enoxaparin (LOVENOX) injection  30 mg Subcutaneous Q24H  . feeding supplement (OSMOLITE 1.2 CAL)  237 mL Per Tube Q4H while awake  . fentaNYL  25 mcg Transdermal Q72H  . fluconazole  100 mg Per Tube Daily  . free water  200 mL Per Tube Q6H  . phenytoin  200 mg Per Tube BID  . silver sulfADIAZINE   Topical BID  . sodium chloride  3 mL Intravenous Q12H   Continuous Infusions: . sodium chloride 10 mL/hr at 07/05/13 1400    Principal Problem:   Hyperkalemia Active Problems:   Anemia, unspecified   MDS (myelodysplastic syndrome), low grade   Seizure disorder   Protein-calorie malnutrition, severe   Squamous cell carcinoma of the epiglottis    Time spent: 25  min    [facility] Pager [phone]. If 7PM-7AM, please contact night-coverage at [email or web address], password [facility] 07/06/2013, 11:49 AM  LOS: 3 days

## 2013-07-07 DIAGNOSIS — R070 Pain in throat: Secondary | ICD-10-CM

## 2013-07-07 LAB — CBC
HEMATOCRIT: 24.3 % — AB (ref 39.0–52.0)
HEMOGLOBIN: 8.1 g/dL — AB (ref 13.0–17.0)
MCH: 38.6 pg — ABNORMAL HIGH (ref 26.0–34.0)
MCHC: 33.3 g/dL (ref 30.0–36.0)
MCV: 115.7 fL — AB (ref 78.0–100.0)
Platelets: 247 10*3/uL (ref 150–400)
RBC: 2.1 MIL/uL — ABNORMAL LOW (ref 4.22–5.81)
RDW: 17.2 % — AB (ref 11.5–15.5)
WBC: 6.2 10*3/uL (ref 4.0–10.5)

## 2013-07-07 LAB — COMPREHENSIVE METABOLIC PANEL
ALK PHOS: 121 U/L — AB (ref 39–117)
ALT: 14 U/L (ref 0–53)
AST: 19 U/L (ref 0–37)
Albumin: 2.4 g/dL — ABNORMAL LOW (ref 3.5–5.2)
BILIRUBIN TOTAL: 0.2 mg/dL — AB (ref 0.3–1.2)
BUN: 25 mg/dL — ABNORMAL HIGH (ref 6–23)
CHLORIDE: 101 meq/L (ref 96–112)
CO2: 25 mEq/L (ref 19–32)
Calcium: 8.5 mg/dL (ref 8.4–10.5)
Creatinine, Ser: 0.67 mg/dL (ref 0.50–1.35)
GFR calc non Af Amer: >90 mL/min (ref >90)
GLUCOSE: 81 mg/dL (ref 70–99)
POTASSIUM: 4 meq/L (ref 3.7–5.3)
Sodium: 139 mEq/L (ref 137–147)
Total Protein: 7.2 g/dL (ref 6.0–8.3)

## 2013-07-07 LAB — OCCULT BLOOD X 1 CARD TO LAB, STOOL: Fecal Occult Bld: NEGATIVE

## 2013-07-07 MED ORDER — ENOXAPARIN SODIUM 40 MG/0.4ML ~~LOC~~ SOLN
40.0000 mg | SUBCUTANEOUS | Status: DC
  Administered 2013-07-07 – 2013-07-08 (×2): 40 mg via SUBCUTANEOUS
  Filled 2013-07-07 (×3): qty 0.4

## 2013-07-07 NOTE — Progress Notes (Signed)
[facility] PROGRESS NOTE  Adrian [name] TML:[id number] DOB: 1951/04/12 DOA: 07/03/2013 PCP: [name], MD  Assessment/Plan:  UTI Patient has abnormal UA, will obtain urine culture. Will start the Rocephin empirically and await the urine culture results.  Hypokalemia Replete  NSVT Patient had 7 beat run of NSVT.  Resolved, Magnesium level is normal   Uremia  Improved after starting the IV fluids. Patient does not have mental status changes at this time.   Severe protein calorie malnutrition  Appreciate  nutrition consult, started on osmolite at 30 ml/hr, will check Phosphorus, magnesium and CMP in am for possible refeeding syndrome. Will change to Osmolite 1.2 cal , 6 cans a day.  Anemia  Secondary to anemia of chronic disease  Fecal occult blood is positive. H&H is stable, will follow.   Called and discussed with Dr Carol Ada, who at this time recommends to observe as he has multiple other problems. If Hb starts to drop or he develops melena/hematochezia, then will call Dr Benson Norway. No procedures at this time.  Seizure disorder  Continue Dilantin    Code Status: Full code Family Communication: No  Family at bedside Disposition Plan: Home when stable   Consultants:  None  Procedures:  None  Antibiotics:  None  HPI/Subjective: Patient admitted with severe malnutrition and hyperkalemia, dehydration. Started on the tube feedings, IV fluids. Has low potassium.  Patient denies any complaints.Started on IV Rocephin.  Objective: Filed Vitals:   07/07/13 0500  BP: 125/51  Pulse: 96  Temp: 99.9 F (37.7 C)  Resp: 24    Intake/Output Summary (Last 24 hours) at 07/07/13 1310 Last data filed at 07/07/13 1245  Gross per 24 hour  Intake 2198.34 ml  Output    952 ml  Net 1246.34 ml   Filed Weights   07/03/13 1819  Weight: 45.995 kg (101 lb 6.4 oz)    Exam:        Physical Exam: Head: Normocephalic, atraumatic.  Eyes: No signs of  jaundice, EOMI Nose: Mucous membranes dry.  Throat: Oropharynx nonerythematous, no exudate appreciated.  Neck: supple,No deformities, masses, or tenderness noted. Lungs: Normal respiratory effort. B/L Clear to auscultation, no crackles or wheezes.  Heart: Regular RR. S1 and S2 normal  Abdomen: BS normoactive. Soft, Nondistended, non-tender.  Extremities: No pretibial edema, no erythema   Data Reviewed: Basic Metabolic Panel:  Recent Labs Lab 07/04/13 0435 07/04/13 1530 07/05/13 0350 07/06/13 0512 07/07/13 0545  NA 137 143 142 142 139  K 5.7* 3.6* 3.0* 3.6* 4.0  CL 99 102 104 105 101  CO2 22 25 25 26 25   GLUCOSE 138* 118* 124* 107* 81  BUN 81* 61* 46* 29* 25*  CREATININE 0.86 0.92 0.76 0.71 0.67  CALCIUM 8.5 8.9 8.4 8.6 8.5  MG  --  2.9*  --   --   --   PHOS  --   --  3.8  --   --    Liver Function Tests:  Recent Labs Lab 07/03/13 1546 07/04/13 0435 07/05/13 0350 07/07/13 0545  AST 19 45* 20 19  ALT 22 30 20 14   ALKPHOS 195* 169* 146* 121*  BILITOT 0.2* 0.3 <0.2* 0.2*  PROT 9.3* 8.3 7.4 7.2  ALBUMIN 3.2* 2.7* 2.5* 2.4*   No results found for this basename: LIPASE, AMYLASE,  in the last 168 hours No results found for this basename: AMMONIA,  in the last 168 hours CBC:  Recent Labs Lab 07/03/13 1148 07/03/13 1546 07/03/13 1555 07/04/13 0435  07/05/13 0350 07/07/13 0545  WBC 6.7 5.2  --  16.0* 7.6 6.2  NEUTROABS 5.4  --   --   --   --   --   HGB 9.7* 8.4* 9.9* 8.4* 7.9* 8.1*  HCT 28.8* 25.0* 29.0* 25.1* 24.3* 24.3*  MCV 116.2* 112.6*  --  115.1* 117.4* 115.7*  PLT 258 243  --  236 208 247   Cardiac Enzymes: No results found for this basename: CKTOTAL, CKMB, CKMBINDEX, TROPONINI,  in the last 168 hours BNP (last 3 results)  Recent Labs  01/23/13 0059 01/25/13 0500  PROBNP 2575.0* 4987.0*   CBG: No results found for this basename: GLUCAP,  in the last 168 hours  No results found for this or any previous visit (from the past 240 hour(s)).    Studies: No results found.  Scheduled Meds: . cefTRIAXone (ROCEPHIN)  IV  1 g Intravenous Q24H  . enoxaparin (LOVENOX) injection  40 mg Subcutaneous Q24H  . feeding supplement (OSMOLITE 1.2 CAL)  237 mL Per Tube Q4H while awake  . fentaNYL  25 mcg Transdermal Q72H  . fluconazole  100 mg Per Tube Daily  . free water  200 mL Per Tube Q6H  . phenytoin  200 mg Per Tube BID  . silver sulfADIAZINE   Topical BID  . sodium chloride  3 mL Intravenous Q12H   Continuous Infusions: . sodium chloride 10 mL/hr at 07/05/13 1400    Principal Problem:   Hyperkalemia Active Problems:   Anemia, unspecified   MDS (myelodysplastic syndrome), low grade   Seizure disorder   Protein-calorie malnutrition, severe   Squamous cell carcinoma of the epiglottis    Time spent: 25  min    [facility] Pager [phone]. If 7PM-7AM, please contact night-coverage at [email or web address], password [facility] 07/07/2013, 1:10 PM  LOS: 4 days

## 2013-07-07 NOTE — Progress Notes (Signed)
Nutrition Brief Follow Up  Intervention:  - Continue Osmolite 1.2, 6 cans daily.  - RD to continue to monitor TF tolerance.    Assessment:  Patient at risk for refeeding syndrome. Potassium WNL, no new magnesium/phosphorus.   Osmolite 1.2 changed to 1 can, 6 times daily with 200 ml free water 4 times daily. This provides 1706 kcal, 79 g protein, 1948 ml free water. Patient is tolerating bolus feeds well.   Larey Seat, RD, LDN Pager #: [phone] After-Hours Pager #: [phone]

## 2013-07-08 DIAGNOSIS — N390 Urinary tract infection, site not specified: Secondary | ICD-10-CM

## 2013-07-08 NOTE — Progress Notes (Signed)
[facility] PROGRESS NOTE  Adrian [name] BBC:[id number] DOB: March 26, 1951 DOA: 07/03/2013 PCP: [name], MD  Assessment/Plan:  UTI Patient has abnormal UA, will obtain urine culture. Will start the Rocephin empirically and await the urine culture results.  Hypokalemia Replete  NSVT Patient had 7 beat run of NSVT.  Resolved, Magnesium level is normal   Uremia  Improved after starting the IV fluids. Patient does not have mental status changes at this time.   Severe protein calorie malnutrition  Appreciate  nutrition consult, started on osmolite at 30 ml/hr, will check Phosphorus, magnesium and CMP in am for possible refeeding syndrome. Will change to Osmolite 1.2 cal , 6 cans a day.  Anemia  Secondary to anemia of chronic disease  Fecal occult blood is positive. H&H is stable, will follow.   Called and discussed with Dr Carol Ada, who at this time recommends to observe as he has multiple other problems. If Hb starts to drop or he develops melena/hematochezia, then will call Dr Benson Norway. No procedures at this time.  Seizure disorder  Continue Dilantin    Code Status: Full code Family Communication: No  Family at bedside Disposition Plan: Home when stable   Consultants:  None  Procedures:  None  Antibiotics:  None  HPI/Subjective: 19 your old male who was recently diagnosed with squamous cell carcinoma of the epiglottis, status post 9 weeks of radiation treatment which ended last week on Friday today went to oncology clinic for blood work, where he was found to have abnormal labs with high potassium and sent to ED for further evaluation. Patient has not been eating and drinking well, he does have a PEG tube in placed in September last year. His wife has been trying to give him milk through the PEG tube. Patient has severe protein calorie malnutrition and has lost significant amount of weight. Today he weighs 46.7 Kg.Patient admitted with severe malnutrition  and hyperkalemia, dehydration. Started on the tube feedings, IV fluids. Has low potassium.  Patient denies any complaints.Started on IV Rocephin for UTI, urine culture result are pending  Objective: Filed Vitals:   07/08/13 0541  BP: 92/57  Pulse: 81  Temp: 98.1 F (36.7 C)  Resp: 20    Intake/Output Summary (Last 24 hours) at 07/08/13 1001 Last data filed at 07/08/13 0807  Gross per 24 hour  Intake 1404.5 ml  Output   1126 ml  Net  278.5 ml   Filed Weights   07/03/13 1819  Weight: 45.995 kg (101 lb 6.4 oz)    Exam:        Physical Exam: Head: Normocephalic, atraumatic.  Eyes: No signs of jaundice, EOMI Nose: Mucous membranes dry.  Throat: Oropharynx nonerythematous, no exudate appreciated.  Neck: supple,No deformities, masses, or tenderness noted. Lungs: Normal respiratory effort. B/L Clear to auscultation, no crackles or wheezes.  Heart: Regular RR. S1 and S2 normal  Abdomen: BS normoactive. Soft, Nondistended, non-tender.  Extremities: No pretibial edema, no erythema   Data Reviewed: Basic Metabolic Panel:  Recent Labs Lab 07/04/13 0435 07/04/13 1530 07/05/13 0350 07/06/13 0512 07/07/13 0545  NA 137 143 142 142 139  K 5.7* 3.6* 3.0* 3.6* 4.0  CL 99 102 104 105 101  CO2 22 25 25 26 25   GLUCOSE 138* 118* 124* 107* 81  BUN 81* 61* 46* 29* 25*  CREATININE 0.86 0.92 0.76 0.71 0.67  CALCIUM 8.5 8.9 8.4 8.6 8.5  MG  --  2.9*  --   --   --  PHOS  --   --  3.8  --   --    Liver Function Tests:  Recent Labs Lab 07/03/13 1546 07/04/13 0435 07/05/13 0350 07/07/13 0545  AST 19 45* 20 19  ALT 22 30 20 14   ALKPHOS 195* 169* 146* 121*  BILITOT 0.2* 0.3 <0.2* 0.2*  PROT 9.3* 8.3 7.4 7.2  ALBUMIN 3.2* 2.7* 2.5* 2.4*   No results found for this basename: LIPASE, AMYLASE,  in the last 168 hours No results found for this basename: AMMONIA,  in the last 168 hours CBC:  Recent Labs Lab 07/03/13 1148 07/03/13 1546 07/03/13 1555 07/04/13 0435  07/05/13 0350 07/07/13 0545  WBC 6.7 5.2  --  16.0* 7.6 6.2  NEUTROABS 5.4  --   --   --   --   --   HGB 9.7* 8.4* 9.9* 8.4* 7.9* 8.1*  HCT 28.8* 25.0* 29.0* 25.1* 24.3* 24.3*  MCV 116.2* 112.6*  --  115.1* 117.4* 115.7*  PLT 258 243  --  236 208 247   Cardiac Enzymes: No results found for this basename: CKTOTAL, CKMB, CKMBINDEX, TROPONINI,  in the last 168 hours BNP (last 3 results)  Recent Labs  01/23/13 0059 01/25/13 0500  PROBNP 2575.0* 4987.0*   CBG: No results found for this basename: GLUCAP,  in the last 168 hours  No results found for this or any previous visit (from the past 240 hour(s)).   Studies: No results found.  Scheduled Meds: . cefTRIAXone (ROCEPHIN)  IV  1 g Intravenous Q24H  . enoxaparin (LOVENOX) injection  40 mg Subcutaneous Q24H  . feeding supplement (OSMOLITE 1.2 CAL)  237 mL Per Tube Q4H while awake  . fentaNYL  25 mcg Transdermal Q72H  . fluconazole  100 mg Per Tube Daily  . free water  200 mL Per Tube Q6H  . phenytoin  200 mg Per Tube BID  . silver sulfADIAZINE   Topical BID  . sodium chloride  3 mL Intravenous Q12H   Continuous Infusions: . sodium chloride 10 mL/hr at 07/05/13 1400    Principal Problem:   Hyperkalemia Active Problems:   Anemia, unspecified   MDS (myelodysplastic syndrome), low grade   Seizure disorder   Protein-calorie malnutrition, severe   Squamous cell carcinoma of the epiglottis    Time spent: 25  min    [facility] Pager [phone]. If 7PM-7AM, please contact night-coverage at [email or web address], password [facility] 07/08/2013, 10:01 AM  LOS: 5 days

## 2013-07-09 DIAGNOSIS — Z930 Tracheostomy status: Secondary | ICD-10-CM

## 2013-07-09 LAB — PHENYTOIN LEVEL, TOTAL: Phenytoin Lvl: 13.5 ug/mL (ref 10.0–20.0)

## 2013-07-09 MED ORDER — SILVER SULFADIAZINE 1 % EX CREA: TOPICAL_CREAM | Freq: Two times a day (BID) | CUTANEOUS | Status: DC

## 2013-07-09 MED ORDER — CIPROFLOXACIN HCL 500 MG PO TABS: 500.0000 mg | ORAL_TABLET | Freq: Two times a day (BID) | ORAL | Status: DC

## 2013-07-09 MED ORDER — FREE WATER: 200.0000 mL | Freq: Four times a day (QID) | Status: DC

## 2013-07-09 MED ORDER — OSMOLITE 1.2 CAL PO LIQD: 237.0000 mL | ORAL | Status: DC

## 2013-07-09 MED ORDER — PHENYTOIN 125 MG/5ML PO SUSP: 200.0000 mg | Freq: Two times a day (BID) | ORAL | Status: DC

## 2013-07-09 NOTE — Progress Notes (Signed)
NUTRITION FOLLOW UP         Intervention:   - Continue Osmolite 1.2, 6 cans daily.  - RD to continue to monitor TF tolerance.   Nutrition Dx:   Unintentional wt loss related to inadequate energy intake as evidenced by 20 lb wt loss in one month, <50% intake of nutrition needs., ongoing   Goal:   TF to meet >/= 90% of estimated nutrition needs, met  Monitor:   TF tolerance/adequacy, I/Os, weight trends, labs  Assessment:   53 your old male who was recently diagnosed with squamous cell carcinoma of the epiglottis, status post 9 weeks of radiation treatment which ended last week on Friday today went to oncology clinic for blood work, where he was found to have abnormal labs with high potassium and sent to ED for further evaluation. Patient has not been eating and drinking well, he does have a PEG tube in placed in September last year. His wife has been trying to give him milk through the PEG tube. Patient has severe protein calorie malnutrition and has lost significant amount of weight.  Per conversation with nurse, patient is receiving and tolerating Osmolite 1.2 bolus feeds. Patient reported no abdominal pain, but stated that he would like to eat regular food.   Current TF regimen of Osmolite 1.2 x 6 cans daily with 200 ml free water 4 times daily provides:  1706 kcal, 79 g protein, 1948 ml free water. Recommend pt continue with current bolus regimen upon d/c and continue to follow up with Outpatient oncology RD. If pt finds this amount to be difficult, consider Osmolite 1.5 of 1.5 cans QID.   Potassium WNL, no new magnesium/phosphorus. Renal function improving  Height: Ht Readings from Last 1 Encounters:  07/03/13 6' (1.829 m)    Weight Status:   Wt Readings from Last 1 Encounters:  07/09/13 101 lb 1.6 oz (45.859 kg)  2/25  101 lb 2/19 109 lb 2/10 116 lb 1/27 122 lb  Body mass index is 13.71 kg/(m^2).   Re-estimated needs:  Kcal: 1650-1850  Protein: 70-85 gram  Fluid:  2000 ml/daily  Skin: raised scab-neck  Diet Order: NPO   Intake/Output Summary (Last 24 hours) at 07/09/13 0937 Last data filed at 07/09/13 0516  Gross per 24 hour  Intake   1401 ml  Output   1075 ml  Net    326 ml    Last BM: 3/1   Labs:   Recent Labs Lab 07/04/13 1530 07/05/13 0350 07/06/13 0512 07/07/13 0545  NA 143 142 142 139  K 3.6* 3.0* 3.6* 4.0  CL 102 104 105 101  CO2 '25 25 26 25  ' BUN 61* 46* 29* 25*  CREATININE 0.92 0.76 0.71 0.67  CALCIUM 8.9 8.4 8.6 8.5  MG 2.9*  --   --   --   PHOS  --  3.8  --   --   GLUCOSE 118* 124* 107* 81    CBG (last 3)  No results found for this basename: GLUCAP,  in the last 72 hours  Scheduled Meds: . cefTRIAXone (ROCEPHIN)  IV  1 g Intravenous Q24H  . enoxaparin (LOVENOX) injection  40 mg Subcutaneous Q24H  . feeding supplement (OSMOLITE 1.2 CAL)  237 mL Per Tube Q4H while awake  . fentaNYL  25 mcg Transdermal Q72H  . fluconazole  100 mg Per Tube Daily  . free water  200 mL Per Tube Q6H  . phenytoin  200 mg Per Tube BID  .  silver sulfADIAZINE   Topical BID  . sodium chloride  3 mL Intravenous Q12H    Continuous Infusions: . sodium chloride 10 mL/hr at 07/05/13 [place], Dietetic Intern Pager: [place] Clinical Dietitian [phone]

## 2013-07-09 NOTE — Progress Notes (Signed)
Reviewed discharge instructions with patient and spouse.  Removed tele box and IV.  Patient seemed happy to be going home.

## 2013-07-09 NOTE — Discharge Summary (Addendum)
Physician Discharge Summary  Adrian [name] FYB:[id number] DOB: 1950/12/11 DOA: 07/03/2013  PCP: [name], MD  Admit date: 07/03/2013 Discharge date: 07/09/2013  Time spent: 53* minutes  Recommendations for Outpatient Follow-up:  1. *Follow up PCP in 2 weeks  Discharge Diagnoses:  Principal Problem:   Hyperkalemia Active Problems:   Anemia, unspecified   MDS (myelodysplastic syndrome), low grade   Seizure disorder   Protein-calorie malnutrition, severe   Squamous cell carcinoma of the epiglottis   UTI (urinary tract infection)   Discharge Condition: Stable  Diet recommendation: Continue the tube feedings with Osmolite  1 can 6 cans a day  Filed Weights   07/03/13 1819 07/09/13 0515  Weight: 45.995 kg (101 lb 6.4 oz) 45.859 kg (101 lb 1.6 oz)    History of present illness:  11 your old male who was recently diagnosed with squamous cell carcinoma of the epiglottis, status post 9 weeks of radiation treatment which ended last week on Friday today went to oncology clinic for blood work, where he was found to have abnormal labs with high potassium and sent to ED for further evaluation. Patient has not been eating and drinking well, he does have a PEG tube in placed in September last year. His wife has been trying to give him milk through the PEG tube. Patient has severe protein calorie malnutrition and has lost significant amount of weight.  Today he weighs 46.7 Kg.  Patient denies any symptoms, except pain in the throat and difficulty swallowing. He denies chest pain, no shortness of breath, no palpitations no nausea vomiting no diarrhea.   Hospital Course:  UTI  Patient was started on Rocephin for abnormal UA. Urine culture was sent on 2/28 and has not resulted as yet. Patient's white count has improved he has been afebrile. We'll discharge the patient home on by mouth Cipro.Will need to follow the urine culture results.   Hypokalemia  Repleted  Squamous cell carcinoma  of the epiglottis status post 9 weeks of radiation treatment which ended last week on Friday  Will follow up radiation oncology in one week  NSVT  Patient had 7 beat run of NSVT. Resolved, Magnesium level is normal   Uremia  Improved after starting the IV fluids. Patient does not have mental status changes at this time.   Severe protein calorie malnutrition  Appreciate nutrition consult, started on osmolite at 30 ml/hr, will check Phosphorus, magnesium and CMP in am for possible refeeding syndrome. Will change to Osmolite 1.2 cal , 6 cans a day. Patient has been doing very well on Osmolite and continue using Osmolite at home.  Anemia  Secondary to anemia of chronic disease  Fecal occult blood is positive.  H&H is stable, will follow.  Called and discussed with Dr Carol Ada, who at this time recommends to observe as he has multiple other problems. If Hb starts to drop or he develops melena/hematochezia, then will call Dr Benson Norway. No procedures at this time.   Seizure disorder  Continue Dilantin    Procedures: *None  Consultations:  None  Discharge Exam: Filed Vitals:   07/09/13 0515  BP: 122/54  Pulse: 68  Temp: 98.5 F (36.9 C)  Resp: 20    General: *Appears in no acute distress Cardiovascular: *S1-S2 regular Respiratory: Clear bilaterally  Discharge Instructions  Discharge Orders   Future Appointments Provider Department Dept Phone   07/11/2013 12:30 PM [name], MD [facility] [phone]   08/01/2013 9:40 AM Nelda Severe  [place], [facility] [phone]   Future Orders Complete By Expires   Discharge instructions  As directed    Comments:     Osmolite 1.2  1 can every 4 hrs, for total 6 cans per day.   Increase activity slowly  As directed        Medication List    STOP taking these medications       phenytoin 100 MG ER capsule  Commonly known as:  DILANTIN      TAKE these medications        ciprofloxacin 500 MG tablet  Commonly known as:  CIPRO  Take 1 tablet (500 mg total) by mouth 2 (two) times daily.     cyanocobalamin 500 MCG tablet  Take 500 mcg by mouth daily.     diazepam 5 MG tablet  Commonly known as:  VALIUM  Take 5 mg by mouth every 6 (six) hours as needed for anxiety.     emollient cream  Commonly known as:  BIAFINE  Apply 1 application topically daily. Apply to affected skin area on neck after rad txs when skin becomes irritated ot itching,dryness daily     feeding supplement (OSMOLITE 1.2 CAL) Liqd  Place 237 mLs into feeding tube every 4 (four) hours while awake.     fentaNYL 25 MCG/HR patch  Commonly known as:  DURAGESIC - dosed mcg/hr  Place 1 patch (25 mcg total) onto the skin every 3 (three) days.     fluconazole 100 MG tablet  Commonly known as:  DIFLUCAN  Take 1 tablet (100 mg total) by mouth daily.     folic acid 1 MG tablet  Commonly known as:  FOLVITE  Place 1 tablet (1 mg total) into feeding tube daily.     free water Soln  Place 200 mLs into feeding tube every 6 (six) hours.     HEMATOGEN FORTE PO  Take 1 capsule by mouth daily.     HYDROcodone-acetaminophen 7.5-325 mg/15 ml solution  Commonly known as:  HYCET  Take 10 mLs by mouth 4 (four) times daily as needed for moderate pain.     mometasone 50 MCG/ACT nasal spray  Commonly known as:  NASONEX  Place 2 sprays into both nostrils daily.     morphine 15 MG 12 hr tablet  Commonly known as:  MS CONTIN  Take 1 tablet (15 mg total) by mouth every 12 (twelve) hours.     oxyCODONE-acetaminophen 5-325 MG per tablet  Commonly known as:  PERCOCET  Take 1 tablet by mouth every 6 (six) hours as needed for severe pain.     phenytoin 125 MG/5ML suspension  Commonly known as:  DILANTIN  Place 8 mLs (200 mg total) into feeding tube 2 (two) times daily.     silver sulfADIAZINE 1 % cream  Commonly known as:  SILVADENE  Apply topically 2 (two) times daily. Apply to the neck      vitamin E 1000 UNIT capsule  Take 500 Units by mouth daily.       No Known Allergies     Follow-up Information   Follow up with [name], MD In 1 week.   Specialty:  Family Medicine   Contact information:   [address], [place] [address] [phone]       Please follow up. (Follow up urine culture results)        The results of significant diagnostics from this hospitalization (including  imaging, microbiology, ancillary and laboratory) are listed below for reference.    Significant Diagnostic Studies: Dg Chest 2 View  07/03/2013   CLINICAL DATA:  Shortness of breath and weakness. History of laryngeal and epiglottic cancer.  EXAM: CHEST  2 VIEW  COMPARISON:  April 15, 2013  FINDINGS: The heart size and mediastinal contours are within normal limits. Stable asymmetric scarring is identified in the right apex unchanged compared to prior chest x-ray. There is no pulmonary edema or pleural effusion. The visualized skeletal structures are stable.  IMPRESSION: No active cardiopulmonary disease.   Electronically Signed   By: Abelardo Diesel M.D.   On: 07/03/2013 16:40    Microbiology: No results found for this or any previous visit (from the past 240 hour(s)).   Labs: Basic Metabolic Panel:  Recent Labs Lab 07/04/13 0435 07/04/13 1530 07/05/13 0350 07/06/13 0512 07/07/13 0545  NA 137 143 142 142 139  K 5.7* 3.6* 3.0* 3.6* 4.0  CL 99 102 104 105 101  CO2 22 25 25 26 25   GLUCOSE 138* 118* 124* 107* 81  BUN 81* 61* 46* 29* 25*  CREATININE 0.86 0.92 0.76 0.71 0.67  CALCIUM 8.5 8.9 8.4 8.6 8.5  MG  --  2.9*  --   --   --   PHOS  --   --  3.8  --   --    Liver Function Tests:  Recent Labs Lab 07/03/13 1546 07/04/13 0435 07/05/13 0350 07/07/13 0545  AST 19 45* 20 19  ALT 22 30 20 14   ALKPHOS 195* 169* 146* 121*  BILITOT 0.2* 0.3 <0.2* 0.2*  PROT 9.3* 8.3 7.4 7.2  ALBUMIN 3.2* 2.7* 2.5* 2.4*   No results found for this basename: LIPASE,  AMYLASE,  in the last 168 hours No results found for this basename: AMMONIA,  in the last 168 hours CBC:  Recent Labs Lab 07/03/13 1148 07/03/13 1546 07/03/13 1555 07/04/13 0435 07/05/13 0350 07/07/13 0545  WBC 6.7 5.2  --  16.0* 7.6 6.2  NEUTROABS 5.4  --   --   --   --   --   HGB 9.7* 8.4* 9.9* 8.4* 7.9* 8.1*  HCT 28.8* 25.0* 29.0* 25.1* 24.3* 24.3*  MCV 116.2* 112.6*  --  115.1* 117.4* 115.7*  PLT 258 243  --  236 208 247   Cardiac Enzymes: No results found for this basename: CKTOTAL, CKMB, CKMBINDEX, TROPONINI,  in the last 168 hours BNP: BNP (last 3 results)  Recent Labs  01/23/13 0059 01/25/13 0500  PROBNP 2575.0* 4987.0*   CBG: No results found for this basename: GLUCAP,  in the last 168 hours    Signed:  Radonna Bracher S  [facility] 07/09/2013, 10:30 AM

## 2013-07-10 LAB — URINE CULTURE: Colony Count: >=100000

## 2013-07-11 ENCOUNTER — Ambulatory Visit (HOSPITAL_COMMUNITY)
Admission: RE | Admit: 2013-07-11 | Discharge: 2013-07-11 | Disposition: A | Discharge status: Home / Self Care | Payer: BC Managed Care – PPO | Source: Ambulatory Visit | Attending: Hematology and Oncology | Admitting: Hematology and Oncology

## 2013-07-11 ENCOUNTER — Ambulatory Visit (HOSPITAL_BASED_OUTPATIENT_CLINIC_OR_DEPARTMENT_OTHER): Payer: BC Managed Care – PPO

## 2013-07-11 ENCOUNTER — Encounter: Payer: Self-pay | Admitting: Hematology and Oncology

## 2013-07-11 ENCOUNTER — Ambulatory Visit (HOSPITAL_BASED_OUTPATIENT_CLINIC_OR_DEPARTMENT_OTHER): Payer: BC Managed Care – PPO | Admitting: Hematology and Oncology

## 2013-07-11 ENCOUNTER — Encounter: Payer: Self-pay | Admitting: *Deleted

## 2013-07-11 ENCOUNTER — Telehealth: Payer: Self-pay | Admitting: Hematology and Oncology

## 2013-07-11 ENCOUNTER — Other Ambulatory Visit: Payer: Self-pay | Admitting: Hematology and Oncology

## 2013-07-11 VITALS — BP 108/63 | HR 99 | Temp 98.3°F | Resp 18 | Ht 72.0 in | Wt 113.3 lb

## 2013-07-11 VITALS — BP 125/74 | HR 82 | Temp 97.5°F | Resp 18

## 2013-07-11 DIAGNOSIS — R634 Abnormal weight loss: Secondary | ICD-10-CM

## 2013-07-11 DIAGNOSIS — R070 Pain in throat: Secondary | ICD-10-CM

## 2013-07-11 DIAGNOSIS — D4620 Refractory anemia with excess of blasts, unspecified: Secondary | ICD-10-CM

## 2013-07-11 DIAGNOSIS — D630 Anemia in neoplastic disease: Secondary | ICD-10-CM

## 2013-07-11 DIAGNOSIS — C321 Malignant neoplasm of supraglottis: Secondary | ICD-10-CM

## 2013-07-11 DIAGNOSIS — D649 Anemia, unspecified: Secondary | ICD-10-CM

## 2013-07-11 DIAGNOSIS — C329 Malignant neoplasm of larynx, unspecified: Secondary | ICD-10-CM | POA: Insufficient documentation

## 2013-07-11 DIAGNOSIS — D469 Myelodysplastic syndrome, unspecified: Secondary | ICD-10-CM | POA: Insufficient documentation

## 2013-07-11 LAB — BASIC METABOLIC PANEL (CC13)
Anion Gap: 9 mEq/L (ref 3–11)
BUN: 22.3 mg/dL (ref 7.0–26.0)
CALCIUM: 9.3 mg/dL (ref 8.4–10.4)
CO2: 23 meq/L (ref 22–29)
CREATININE: 0.7 mg/dL (ref 0.7–1.3)
Chloride: 104 mEq/L (ref 98–109)
Glucose: 93 mg/dl (ref 70–140)
Potassium: 5.2 mEq/L — ABNORMAL HIGH (ref 3.5–5.1)
SODIUM: 136 meq/L (ref 136–145)

## 2013-07-11 LAB — CBC WITH DIFFERENTIAL/PLATELET
BASO%: 0.5 % (ref 0.0–2.0)
Basophils Absolute: 0 10*3/uL (ref 0.0–0.1)
EOS%: 1.1 % (ref 0.0–7.0)
Eosinophils Absolute: 0.1 10*3/uL (ref 0.0–0.5)
HEMATOCRIT: 23.5 % — AB (ref 38.4–49.9)
HGB: 7.8 g/dL — ABNORMAL LOW (ref 13.0–17.1)
LYMPH%: 12.4 % — AB (ref 14.0–49.0)
MCH: 38 pg — AB (ref 27.2–33.4)
MCHC: 33.2 g/dL (ref 32.0–36.0)
MCV: 114.6 fL — AB (ref 79.3–98.0)
MONO#: 0.6 10*3/uL (ref 0.1–0.9)
MONO%: 12.9 % (ref 0.0–14.0)
NEUT#: 3.2 10*3/uL (ref 1.5–6.5)
NEUT%: 73.1 % (ref 39.0–75.0)
Platelets: 267 10*3/uL (ref 140–400)
RBC: 2.05 10*6/uL — AB (ref 4.20–5.82)
RDW: 16.7 % — ABNORMAL HIGH (ref 11.0–14.6)
WBC: 4.4 10*3/uL (ref 4.0–10.3)
lymph#: 0.6 10*3/uL — ABNORMAL LOW (ref 0.9–3.3)

## 2013-07-11 LAB — HOLD TUBE, BLOOD BANK

## 2013-07-11 LAB — PREPARE RBC (CROSSMATCH)

## 2013-07-11 MED ORDER — SODIUM CHLORIDE 0.9 % IV SOLN
250.0000 mL | Freq: Once | INTRAVENOUS | Status: AC
  Administered 2013-07-11: 250 mL via INTRAVENOUS

## 2013-07-11 NOTE — Progress Notes (Signed)
[facility] OFFICE PROGRESS NOTE  Patient Care Team: [name], MD as PCP - General (Family Medicine) [name], MD as Consulting Physician (Hematology and Oncology) Brooks Sailors, RN as Registered Nurse (Oncology)  DIAGNOSIS: Myelodysplastic syndrome, recent laryngeal cancer, for further followup  SUMMARY OF ONCOLOGIC HISTORY: Oncology History   MDS, R-IPSS score of 3, low risk (Hg 8.9, +16 chromosome on BM, 2% blast count)   Squamous cell carcinoma of the epiglottis   Primary site: Larynx - Supraglottis (Left)   Staging method: AJCC 7th Edition   Clinical: Stage I (T1, N0, M0) signed by [name], MD on 04/30/2013 12:49 PM   Pathologic: Stage I (T1, N0, cM0) signed by [name], MD on 04/30/2013 12:49 PM   Summary: Stage I (T1, N0, cM0)       MDS (myelodysplastic syndrome), low grade   02/01/2007 Bone Marrow Biopsy BM biopsy was abnormal, overall probable low grade MDS   03/23/2011 - 02/06/2013 Chemotherapy [name], discontinued due to diagnosis of laryngeal ca    Squamous cell carcinoma of the epiglottis   03/06/2013 Procedure Biopsy from epiglottic region showed invasive [facility]   04/25/2013 Imaging Ct scan showed no other involvement in the LN    INTERVAL HISTORY: Adrian [name] 63 y.o. male returns for further followup. The patient was admitted to the hospital recently due to acute renal failure from severe dehydration. He received continuous IV fluid hydration and upon dismissal, his kidney function returned back to normal. He is doing very well. His sore throat is improving. The patient is no longer taking pain medications. Denies any recent infection. No fevers chills or cough. The patient is able to tolerate some soft diet. He has gained 5 pounds of weight since the last time I saw him. The patient denies any recent signs or symptoms of bleeding such as spontaneous epistaxis, hematuria or hematochezia.  I have reviewed the  past medical history, past surgical history, social history and family history with the patient and they are unchanged from previous note.  ALLERGIES:  has No Known Allergies.  MEDICATIONS:  Current Outpatient Prescriptions  Medication Sig Dispense Refill  . ciprofloxacin (CIPRO) 500 MG tablet Take 1 tablet (500 mg total) by mouth 2 (two) times daily.  14 tablet  0  . cyanocobalamin 500 MCG tablet Take 500 mcg by mouth daily.      . diazepam (VALIUM) 5 MG tablet Take 5 mg by mouth every 6 (six) hours as needed for anxiety.      Marland Kitchen emollient (BIAFINE) cream Apply 1 application topically daily. Apply to affected skin area on neck after rad txs when skin becomes irritated ot itching,dryness daily      . Fe Fum-Vit C-Vit B12-FA (HEMATOGEN FORTE PO) Take 1 capsule by mouth daily.      . folic acid (FOLVITE) 1 MG tablet Place 1 tablet (1 mg total) into feeding tube daily.      Marland Kitchen HYDROcodone-acetaminophen (HYCET) 7.5-325 mg/15 ml solution Take 10 mLs by mouth 4 (four) times daily as needed for moderate pain.  120 mL  0  . mometasone (NASONEX) 50 MCG/ACT nasal spray Place 2 sprays into both nostrils daily.      . Nutritional Supplements (FEEDING SUPPLEMENT, OSMOLITE 1.2 CAL,) LIQD Place 237 mLs into feeding tube every 4 (four) hours while awake.  237 mL  3  . phenytoin (DILANTIN) 125 MG/5ML suspension Place 8 mLs (200 mg total) into feeding tube 2 (two) times daily.  237 mL  12  . silver sulfADIAZINE (SILVADENE) 1 % cream Apply topically 2 (two) times daily. Apply to the neck  50 g  0  . vitamin E 1000 UNIT capsule Take 500 Units by mouth daily.      . Water For Irrigation, Sterile (FREE WATER) SOLN Place 200 mLs into feeding tube every 6 (six) hours.       No current facility-administered medications for this visit.    REVIEW OF SYSTEMS:   Constitutional: Denies fevers, chills or abnormal weight loss Eyes: Denies blurriness of vision Ears, nose, mouth, throat, and face: Denies mucositis or sore  throat Respiratory: Denies cough, dyspnea or wheezes Cardiovascular: Denies palpitation, chest discomfort or lower extremity swelling Gastrointestinal:  Denies nausea, heartburn or change in bowel habits Skin: Denies abnormal skin rashes Lymphatics: Denies new lymphadenopathy or easy bruising Neurological:Denies numbness, tingling or new weaknesses Behavioral/Psych: Mood is stable, no new changes  All other systems were reviewed with the patient and are negative.  PHYSICAL EXAMINATION: ECOG PERFORMANCE STATUS: 1 - Symptomatic but completely ambulatory  Filed Vitals:   07/11/13 1239  BP: 108/63  Pulse: 99  Temp: 98.3 F (36.8 C)  Resp: 18   Filed Weights   07/11/13 1239  Weight: 113 lb 4.8 oz (51.393 kg)    GENERAL:alert, no distress and comfortable. He looks severely thin and cachectic SKIN: Mild radiation induced skin changes around his neck  EYES: normal, Conjunctiva are pale and non-injected, sclera clear OROPHARYNX:no exudate, no erythema and lips, buccal mucosa, and tongue normal  NECK: supple, thyroid normal size, non-tender, without nodularity LYMPH:  no palpable lymphadenopathy in the cervical, axillary or inguinal LUNGS: clear to auscultation and percussion with normal breathing effort HEART: regular rate & rhythm and no murmurs and no lower extremity edema ABDOMEN:abdomen soft, non-tender and normal bowel sounds. Feeding tube site looks okay Musculoskeletal:no cyanosis of digits and no clubbing  NEURO: alert & oriented x 3 with fluent speech, no focal motor/sensory deficits  LABORATORY DATA:  I have reviewed the data as listed    Component Value Date/Time   NA 139 07/07/2013 0545   NA 134* 07/03/2013 1148   K 4.0 07/07/2013 0545   K 6.0 Repeated and Verified, no visible hemolysis* 07/03/2013 1148   CL 101 07/07/2013 0545   CL 107 09/19/2012 1325   CO2 25 07/07/2013 0545   CO2 21* 07/03/2013 1148   GLUCOSE 81 07/07/2013 0545   GLUCOSE 127 07/03/2013 1148   GLUCOSE 97  09/19/2012 1325   BUN 25* 07/07/2013 0545   BUN 103.2* 07/03/2013 1148   CREATININE 0.67 07/07/2013 0545   CREATININE 1.4* 07/03/2013 1148   CALCIUM 8.5 07/07/2013 0545   CALCIUM 9.7 07/03/2013 1148   PROT 7.2 07/07/2013 0545   PROT 8.4* 05/17/2013 1138   ALBUMIN 2.4* 07/07/2013 0545   ALBUMIN 3.2* 05/17/2013 1138   AST 19 07/07/2013 0545   AST 11 05/17/2013 1138   ALT 14 07/07/2013 0545   ALT 7 05/17/2013 1138   ALKPHOS 121* 07/07/2013 0545   ALKPHOS 175* 05/17/2013 1138   BILITOT 0.2* 07/07/2013 0545   BILITOT 0.23 05/17/2013 1138   GFRNONAA >90 07/07/2013 0545   GFRAA >90 07/07/2013 0545    No results found for this basename: SPEP, UPEP,  kappa and lambda light chains    Lab Results  Component Value Date   WBC 4.4 07/11/2013   NEUTROABS 3.2 07/11/2013   HGB 7.8* 07/11/2013   HCT 23.5* 07/11/2013   MCV  114.6* 07/11/2013   PLT 267 07/11/2013      Chemistry      Component Value Date/Time   NA 139 07/07/2013 0545   NA 134* 07/03/2013 1148   K 4.0 07/07/2013 0545   K 6.0 Repeated and Verified, no visible hemolysis* 07/03/2013 1148   CL 101 07/07/2013 0545   CL 107 09/19/2012 1325   CO2 25 07/07/2013 0545   CO2 21* 07/03/2013 1148   BUN 25* 07/07/2013 0545   BUN 103.2* 07/03/2013 1148   CREATININE 0.67 07/07/2013 0545   CREATININE 1.4* 07/03/2013 1148      Component Value Date/Time   CALCIUM 8.5 07/07/2013 0545   CALCIUM 9.7 07/03/2013 1148   ALKPHOS 121* 07/07/2013 0545   ALKPHOS 175* 05/17/2013 1138   AST 19 07/07/2013 0545   AST 11 05/17/2013 1138   ALT 14 07/07/2013 0545   ALT 7 05/17/2013 1138   BILITOT 0.2* 07/07/2013 0545   BILITOT 0.23 05/17/2013 1138     ASSESSMENT & PLAN:  #1 low-grade myelodysplastic syndrome with severe anemia The patient was receiving erythropoietin stimulating agents up until recently. Due to recent diagnosis of head and neck cancer, I have discontinued erythropoietin stimulating agents due to risk of promoting cancer growth.  Repeat blood work today show worsening anemia. We discussed some of the risks,  benefits, and alternatives of blood transfusions. The patient is symptomatic from anemia and the hemoglobin level is critically low.  Some of the side-effects to be expected including risks of transfusion reactions, chills, infection, syndrome of volume overload and risk of hospitalization from various reasons and the patient is willing to proceed. I will see him back in 2 weeks for further assessment. #2 epiglottic cancer T1, N0, M0 He is experiencing multiple side effects managed to complete his treatment recently. We'll continue to provide supportive care #3 recent acute renal failure I will redraw his blood. I recommend the patient to increase oral intake of hydration and we'll monitor that closely. #4 weight loss Since his sore throat is improving, he is able to tolerate some soft diet. I recommend the patient to continue to increase oral diet intake. Orders Placed This Encounter  Procedures  . CBC with Differential    Standing Status: Future     Number of Occurrences: 1     Standing Expiration Date: 07/11/2014  . Basic metabolic panel    Standing Status: Future     Number of Occurrences: 1     Standing Expiration Date: 07/11/2014  . Hold Tube, Blood Bank    Standing Status: Future     Number of Occurrences: 1     Standing Expiration Date: 07/11/2014   All questions were answered. The patient knows to call the clinic with any problems, questions or concerns. No barriers to learning was detected. I spent 35 minutes counseling the patient face to face. The total time spent in the appointment was 45 minutes and more than 50% was on counseling and review of test results     [facility], Vonne Mcdanel, MD 07/11/2013 2:13 PM

## 2013-07-11 NOTE — Progress Notes (Signed)
To provide support and continuity of care, met with patient and his wife during scheduled follow-up appt with Dr. Alvy Bimler.  Pt stated he has been on liquid diet since being DC'd from [facility] earlier this week with instructions to wait until follow-up with Dr. Alvy Bimler before resuming solid foods.  Dr. Alvy Bimler guided him to begin eating soft foods which he had tolerated well prior to admission.  Patient and his wife verbalized understanding.  I facilitated rescheduling of pt's morning appt on 08/01/13 to 1:30 that day to  More closely approximate his 1:00 appt with Dr. Alvy Bimler.    Gayleen Orem, RN, BSN, [facility] Head & Neck Oncology Navigator [phone]

## 2013-07-11 NOTE — Telephone Encounter (Signed)
Gave pt appt for lab and Md for MArch, sent pt to labs

## 2013-07-12 LAB — TYPE AND SCREEN
ABO/RH(D): O POS
Antibody Screen: POSITIVE
DAT, IgG: NEGATIVE
Donor AG Type: NEGATIVE
UNIT DIVISION: 0
Unit division: 0

## 2013-07-31 ENCOUNTER — Other Ambulatory Visit: Payer: Self-pay | Admitting: Hematology and Oncology

## 2013-07-31 ENCOUNTER — Encounter: Payer: Self-pay | Admitting: Oncology

## 2013-07-31 DIAGNOSIS — N179 Acute kidney failure, unspecified: Secondary | ICD-10-CM

## 2013-07-31 DIAGNOSIS — D4620 Refractory anemia with excess of blasts, unspecified: Secondary | ICD-10-CM

## 2013-07-31 DIAGNOSIS — D46Z Other myelodysplastic syndromes: Secondary | ICD-10-CM

## 2013-08-01 ENCOUNTER — Encounter: Payer: Self-pay | Admitting: Radiation Oncology

## 2013-08-01 ENCOUNTER — Telehealth: Payer: Self-pay | Admitting: Hematology and Oncology

## 2013-08-01 ENCOUNTER — Other Ambulatory Visit (HOSPITAL_BASED_OUTPATIENT_CLINIC_OR_DEPARTMENT_OTHER): Payer: BC Managed Care – PPO

## 2013-08-01 ENCOUNTER — Ambulatory Visit (HOSPITAL_BASED_OUTPATIENT_CLINIC_OR_DEPARTMENT_OTHER): Payer: BC Managed Care – PPO | Admitting: Hematology and Oncology

## 2013-08-01 ENCOUNTER — Encounter: Payer: Self-pay | Admitting: Hematology and Oncology

## 2013-08-01 ENCOUNTER — Ambulatory Visit
Admission: RE | Admit: 2013-08-01 | Discharge: 2013-08-01 | Disposition: A | Discharge status: Home / Self Care | Payer: BC Managed Care – PPO | Source: Ambulatory Visit | Attending: Radiation Oncology | Admitting: Radiation Oncology

## 2013-08-01 ENCOUNTER — Ambulatory Visit: Payer: BC Managed Care – PPO | Admitting: Nutrition

## 2013-08-01 VITALS — BP 129/70 | HR 98 | Temp 98.2°F | Resp 18 | Ht 72.0 in | Wt 121.3 lb

## 2013-08-01 VITALS — BP 137/78 | HR 97 | Resp 20

## 2013-08-01 DIAGNOSIS — N179 Acute kidney failure, unspecified: Secondary | ICD-10-CM

## 2013-08-01 DIAGNOSIS — D4620 Refractory anemia with excess of blasts, unspecified: Secondary | ICD-10-CM

## 2013-08-01 DIAGNOSIS — D649 Anemia, unspecified: Secondary | ICD-10-CM

## 2013-08-01 DIAGNOSIS — C321 Malignant neoplasm of supraglottis: Secondary | ICD-10-CM

## 2013-08-01 DIAGNOSIS — D46Z Other myelodysplastic syndromes: Secondary | ICD-10-CM

## 2013-08-01 LAB — GLUCOSE, CAPILLARY: Glucose-Capillary: 73 mg/dL (ref 70–99)

## 2013-08-01 LAB — CBC & DIFF AND RETIC
BASO%: 1.4 % (ref 0.0–2.0)
Basophils Absolute: 0 10*3/uL (ref 0.0–0.1)
EOS ABS: 0.1 10*3/uL (ref 0.0–0.5)
EOS%: 3.8 % (ref 0.0–7.0)
HCT: 25.9 % — ABNORMAL LOW (ref 38.4–49.9)
HEMOGLOBIN: 8.5 g/dL — AB (ref 13.0–17.1)
Immature Retic Fract: 12.2 % — ABNORMAL HIGH (ref 3.00–10.60)
LYMPH%: 19.8 % (ref 14.0–49.0)
MCH: 36.8 pg — ABNORMAL HIGH (ref 27.2–33.4)
MCHC: 32.8 g/dL (ref 32.0–36.0)
MCV: 112.1 fL — AB (ref 79.3–98.0)
MONO#: 0.4 10*3/uL (ref 0.1–0.9)
MONO%: 18.9 % — AB (ref 0.0–14.0)
NEUT#: 1.2 10*3/uL — ABNORMAL LOW (ref 1.5–6.5)
NEUT%: 56.1 % (ref 39.0–75.0)
Platelets: 269 10*3/uL (ref 140–400)
RBC: 2.31 10*6/uL — ABNORMAL LOW (ref 4.20–5.82)
RDW: 20.7 % — AB (ref 11.0–14.6)
RETIC %: 1.53 % (ref 0.80–1.80)
Retic Ct Abs: 35.34 10*3/uL (ref 34.80–93.90)
WBC: 2.1 10*3/uL — AB (ref 4.0–10.3)
lymph#: 0.4 10*3/uL — ABNORMAL LOW (ref 0.9–3.3)
nRBC: 0 % (ref 0–0)

## 2013-08-01 LAB — BASIC METABOLIC PANEL (CC13)
ANION GAP: 9 meq/L (ref 3–11)
BUN: 16.1 mg/dL (ref 7.0–26.0)
CALCIUM: 9 mg/dL (ref 8.4–10.4)
CO2: 22 mEq/L (ref 22–29)
Chloride: 111 mEq/L — ABNORMAL HIGH (ref 98–109)
Creatinine: 0.7 mg/dL (ref 0.7–1.3)
GLUCOSE: 95 mg/dL (ref 70–140)
Potassium: 4.4 mEq/L (ref 3.5–5.1)
SODIUM: 142 meq/L (ref 136–145)

## 2013-08-01 LAB — FERRITIN CHCC: Ferritin: 473 ng/ml — ABNORMAL HIGH (ref 22–316)

## 2013-08-01 LAB — HOLD TUBE, BLOOD BANK

## 2013-08-01 NOTE — Telephone Encounter (Signed)
gv pt appt schedule for april. lmonvm Tiffany in IR re appt for peg tube removal. IR will contact pt - pt aware.

## 2013-08-01 NOTE — Progress Notes (Signed)
Radiation Oncology         [phone] ________________________________  Name: Adrian [name] MRN: [id number]  Date: 08/01/2013  DOB: December 06, 1950  Follow-Up Visit Note  CC: Adrian Huh, MD  Adrian Gala, MD  Diagnosis:  Squamous cell carcinoma of the epiglottis   Primary site: Larynx - Supraglottis (Left)   Staging method: AJCC 7th Edition   Clinical: Stage I (T1, N0, M0)    Pathologic: Stage I (T1, N0, cM0)    Summary: Stage I (T1, N0, cM0)   Interval Since Last Radiation:  5  weeks  Narrative:  The patient returns today for close follow-up.  The patient was seen soon after completion of his radiation therapy. He was having of sore throat and skin breakdown. Lab work on that followup showed his potassium to be significantly elevated. The patient was admitted to the hospital for further evaluation.   He remained in the hospital from February 25 through March 3. Today the patient feels much better. He denies any pain with swallowing or difficulty with swallowing. He denies any breathing problems or wheezing.                     ALLERGIES:  has No Known Allergies.  Meds: Current Outpatient Prescriptions  Medication Sig Dispense Refill  . cyanocobalamin 500 MCG tablet Take 500 mcg by mouth daily.      . diazepam (VALIUM) 5 MG tablet Take 5 mg by mouth every 6 (six) hours as needed for anxiety.      . folic acid (FOLVITE) 1 MG tablet Place 1 tablet (1 mg total) into feeding tube daily.      . mometasone (NASONEX) 50 MCG/ACT nasal spray Place 2 sprays into both nostrils daily.      . phenytoin (DILANTIN) 125 MG/5ML suspension Place 8 mLs (200 mg total) into feeding tube 2 (two) times daily.  237 mL  12  . vitamin E 1000 UNIT capsule Take 500 Units by mouth daily.      . Water For Irrigation, Sterile (FREE WATER) SOLN Place 200 mLs into feeding tube every 6 (six) hours.      Marland Kitchen emollient (BIAFINE) cream Apply 1 application topically daily. Apply to affected skin area on neck after  rad txs when skin becomes irritated ot itching,dryness daily      . Fe Fum-Vit C-Vit B12-FA (HEMATOGEN FORTE PO) Take 1 capsule by mouth daily.      Marland Kitchen HYDROcodone-acetaminophen (HYCET) 7.5-325 mg/15 ml solution Take 10 mLs by mouth 4 (four) times daily as needed for moderate pain.  120 mL  0  . Nutritional Supplements (FEEDING SUPPLEMENT, OSMOLITE 1.2 CAL,) LIQD Place 237 mLs into feeding tube every 4 (four) hours while awake.  237 mL  3  . silver sulfADIAZINE (SILVADENE) 1 % cream Apply topically 2 (two) times daily. Apply to the neck  50 g  0   No current facility-administered medications for this encounter.    Physical Findings: The patient is in no acute distress. Patient is alert and oriented.  blood pressure is 137/78 and his pulse is 97. His respiration is 20 and oxygen saturation is 100%. .  No palpable adenopathy in the neck or supraclavicular region. The patient's skin is well healed at this time. The oral cavity is moist without secondary infection.   after the patient was checking out to obtain his followup appointment the patient became dizzy and fell at the receptionist window.  One of the other patients  help to ease his fall. Patient denies losing consciousness. He did fall on his left shoulder. I performed an evaluation showing no significant discomfort or swelling in his left shoulder. Patient's range of movement was good. lungs are clear and his heart was regular with a normal rate. The neurological exam was nonfocal. In further evaluation the patient only had 2 cups of coffee today.  . This was an afternoon appointment he was placed in a wheelchair and escorted out with his wife assisting. Patient will eat as soon as possible.  The patient's fingerstick blood glucose was within normal limits. He will call if he feels poorly later today or has  pain in his left shoulder.  Lab Findings: Lab Results  Component Value Date   WBC 2.1* 08/01/2013   HGB 8.5* 08/01/2013   HCT 25.9*  08/01/2013   MCV 112.1* 08/01/2013   PLT 269 08/01/2013      Radiographic Findings: Dg Chest 2 View  07/03/2013   CLINICAL DATA:  Shortness of breath and weakness. History of laryngeal and epiglottic cancer.  EXAM: CHEST  2 VIEW  COMPARISON:  April 15, 2013  FINDINGS: The heart size and mediastinal contours are within normal limits. Stable asymmetric scarring is identified in the right apex unchanged compared to prior chest x-ray. There is no pulmonary edema or pleural effusion. The visualized skeletal structures are stable.  IMPRESSION: No active cardiopulmonary disease.   Electronically Signed   By: Abelardo Diesel M.D.   On: 07/03/2013 16:40    Impression:  The patient is recovering from the effects of radiation.    Plan:  Routine followup in 3 months.  ____________________________________ Blair Promise, MD

## 2013-08-01 NOTE — Progress Notes (Signed)
I was unable to talk with patient but talked with family member who reports patient is doing much better.  He is eating 100% by mouth and using Ensure Plus supplements between meals.  Weight has increased documented as 121.3 pounds March 26.  Nutrition diagnosis: Inadequate oral intake resolved.  Encouraged patient's family member to contact me if patient has any issues with oral intake.  No followup scheduled at this time.

## 2013-08-01 NOTE — Progress Notes (Signed)
Adrian [name] OFFICE PROGRESS NOTE  Patient Care Team: [name], MD as PCP - General (Family Medicine) [name], MD as Consulting Physician (Hematology and Oncology) Brooks Sailors, RN as Registered Nurse (Oncology)  DIAGNOSIS: Myelodysplastic syndrome, recent laryngeal cancer, for further followup  SUMMARY OF ONCOLOGIC HISTORY: Oncology History   MDS, R-IPSS score of 3, low risk (Hg 8.9, +16 chromosome on BM, 2% blast count)   Squamous cell carcinoma of the epiglottis   Primary site: Larynx - Supraglottis (Left)   Staging method: AJCC 7th Edition   Clinical: Stage I (T1, N0, M0) signed by [name], MD on 04/30/2013 12:49 PM   Pathologic: Stage I (T1, N0, cM0) signed by [name], MD on 04/30/2013 12:49 PM   Summary: Stage I (T1, N0, cM0)       MDS (myelodysplastic syndrome), low grade   02/01/2007 Bone Marrow Biopsy BM biopsy was abnormal, overall probable low grade MDS   03/23/2011 - 02/06/2013 Chemotherapy [name], discontinued due to diagnosis of laryngeal ca    Squamous cell carcinoma of the epiglottis   03/06/2013 Procedure Biopsy from epiglottic region showed invasive [facility]   04/25/2013 Imaging Ct scan showed no other involvement in the LN    INTERVAL HISTORY: Adrian [name] 63 y.o. male returns for further followup. The patient has gained 8 pounds of weight since I saw him. He is eating everything by mouth 100%. His sore throat has resolved. He has not needed any pain medications. He has excellent energy level. Denies any symptoms of anemia such as chest pain, shortness of breath or dizziness.  I have reviewed the past medical history, past surgical history, social history and family history with the patient and they are unchanged from previous note.  ALLERGIES:  has No Known Allergies.  MEDICATIONS:  Current Outpatient Prescriptions  Medication Sig Dispense Refill  . cyanocobalamin 500 MCG tablet Take 500 mcg by mouth  daily.      . diazepam (VALIUM) 5 MG tablet Take 5 mg by mouth every 6 (six) hours as needed for anxiety.      Marland Kitchen emollient (BIAFINE) cream Apply 1 application topically daily. Apply to affected skin area on neck after rad txs when skin becomes irritated ot itching,dryness daily      . Fe Fum-Vit C-Vit B12-FA (HEMATOGEN FORTE PO) Take 1 capsule by mouth daily.      . folic acid (FOLVITE) 1 MG tablet Place 1 tablet (1 mg total) into feeding tube daily.      Marland Kitchen HYDROcodone-acetaminophen (HYCET) 7.5-325 mg/15 ml solution Take 10 mLs by mouth 4 (four) times daily as needed for moderate pain.  120 mL  0  . mometasone (NASONEX) 50 MCG/ACT nasal spray Place 2 sprays into both nostrils daily.      . Nutritional Supplements (FEEDING SUPPLEMENT, OSMOLITE 1.2 CAL,) LIQD Place 237 mLs into feeding tube every 4 (four) hours while awake.  237 mL  3  . phenytoin (DILANTIN) 125 MG/5ML suspension Place 8 mLs (200 mg total) into feeding tube 2 (two) times daily.  237 mL  12  . silver sulfADIAZINE (SILVADENE) 1 % cream Apply topically 2 (two) times daily. Apply to the neck  50 g  0  . vitamin E 1000 UNIT capsule Take 500 Units by mouth daily.      . Water For Irrigation, Sterile (FREE WATER) SOLN Place 200 mLs into feeding tube every 6 (six) hours.       No current facility-administered  medications for this visit.    REVIEW OF SYSTEMS:   Constitutional: Denies fevers, chills or abnormal weight loss Eyes: Denies blurriness of vision Ears, nose, mouth, throat, and face: Denies mucositis or sore throat Respiratory: Denies cough, dyspnea or wheezes Cardiovascular: Denies palpitation, chest discomfort or lower extremity swelling Gastrointestinal:  Denies nausea, heartburn or change in bowel habits Skin: Denies abnormal skin rashes Lymphatics: Denies new lymphadenopathy or easy bruising Neurological:Denies numbness, tingling or new weaknesses Behavioral/Psych: Mood is stable, no new changes  All other systems were  reviewed with the patient and are negative.  PHYSICAL EXAMINATION: ECOG PERFORMANCE STATUS: 0 - Asymptomatic  Filed Vitals:   08/01/13 1242  BP: 129/70  Pulse: 98  Temp: 98.2 F (36.8 C)  Resp: 18   Filed Weights   08/01/13 1242  Weight: 121 lb 4.8 oz (55.021 kg)    GENERAL:alert, no distress and comfortable. He looks thin and cachectic SKIN: skin color, texture, turgor are normal, no rashes or significant lesions EYES: normal, Conjunctiva are pink and non-injected, sclera clear OROPHARYNX:no exudate, no erythema and lips, buccal mucosa, and tongue normal  NECK: supple, thyroid normal size, non-tender, without nodularity LYMPH:  no palpable lymphadenopathy in the cervical, axillary or inguinal LUNGS: clear to auscultation and percussion with normal breathing effort HEART: regular rate & rhythm and no murmurs and no lower extremity edema ABDOMEN:abdomen soft, non-tender and normal bowel sounds. Feeding tube site looks okay Musculoskeletal:no cyanosis of digits and no clubbing  NEURO: alert & oriented x 3 with fluent speech, no focal motor/sensory deficits  LABORATORY DATA:  I have reviewed the data as listed    Component Value Date/Time   NA 136 07/11/2013 1330   NA 139 07/07/2013 0545   K 5.2* 07/11/2013 1330   K 4.0 07/07/2013 0545   CL 101 07/07/2013 0545   CL 107 09/19/2012 1325   CO2 23 07/11/2013 1330   CO2 25 07/07/2013 0545   GLUCOSE 93 07/11/2013 1330   GLUCOSE 81 07/07/2013 0545   GLUCOSE 97 09/19/2012 1325   BUN 22.3 07/11/2013 1330   BUN 25* 07/07/2013 0545   CREATININE 0.7 07/11/2013 1330   CREATININE 0.67 07/07/2013 0545   CALCIUM 9.3 07/11/2013 1330   CALCIUM 8.5 07/07/2013 0545   PROT 7.2 07/07/2013 0545   PROT 8.4* 05/17/2013 1138   ALBUMIN 2.4* 07/07/2013 0545   ALBUMIN 3.2* 05/17/2013 1138   AST 19 07/07/2013 0545   AST 11 05/17/2013 1138   ALT 14 07/07/2013 0545   ALT 7 05/17/2013 1138   ALKPHOS 121* 07/07/2013 0545   ALKPHOS 175* 05/17/2013 1138   BILITOT 0.2* 07/07/2013 0545   BILITOT  0.23 05/17/2013 1138   GFRNONAA >90 07/07/2013 0545   GFRAA >90 07/07/2013 0545    No results found for this basename: SPEP,  UPEP,   kappa and lambda light chains    Lab Results  Component Value Date   WBC 2.1* 08/01/2013   NEUTROABS 1.2* 08/01/2013   HGB 8.5* 08/01/2013   HCT 25.9* 08/01/2013   MCV 112.1* 08/01/2013   PLT 269 08/01/2013      Chemistry      Component Value Date/Time   NA 136 07/11/2013 1330   NA 139 07/07/2013 0545   K 5.2* 07/11/2013 1330   K 4.0 07/07/2013 0545   CL 101 07/07/2013 0545   CL 107 09/19/2012 1325   CO2 23 07/11/2013 1330   CO2 25 07/07/2013 0545   BUN 22.3 07/11/2013 1330   BUN   25* 07/07/2013 0545   CREATINE 0.7 07/11/2013 1330   CREATINE 0.67 07/07/2013 0545      Component Value Date/Time   CALCIUM 9.3 07/11/2013 1330   CALCIUM 8.5 07/07/2013 0545   ALKPHOS 121* 07/07/2013 0545   ALKPHOS 175* 05/17/2013 1138   AST 19 07/07/2013 0545   AST 11 05/17/2013 1138   ALT 14 07/07/2013 0545   ALT 7 05/17/2013 1138   BILITOT 0.2* 07/07/2013 0545   BILITOT 0.23 05/17/2013 1138      ASSESSMENT & PLAN:  #1 low-grade myelodysplastic syndrome with severe anemia The patient was receiving erythropoietin stimulating agents up until recently. Due to recent diagnosis of head and neck cancer, I have discontinued erythropoietin stimulating agents due to risk of promoting cancer growth.  Repeat blood work today showed that anemia is stable. He does not require transfusion. I will see him back in 4 weeks for further assessment. The patient will come back in 2 weeks followup appointment and transfuse as needed. I discussed with the patient I plan to repeat bone marrow aspirate and biopsy in the near future once he has fully recovered from recent radiation treatment. #2 epiglottic cancer T1, N0, M0 He is experiencing multiple side effects managed to complete his treatment recently. We'll continue to provide supportive care #3 recent acute renal failure, resolved I recommend the patient to increase  oral intake of hydration and we'll monitor that closely. #4 weight loss, resolved Since his sore throat is improving, he is able to tolerate some soft diet. I recommend the patient to continue to increase oral diet intake. I will have his feeding tube removed and will consult interventional radiology to take it out.  Orders Placed This Encounter  Procedures  . IR GASTROSTOMY TUBE REMOVAL    Standing Status: Future     Number of Occurrences:      Standing Expiration Date: 10/02/2014    Order Specific Question:  Reason for Exam (SYMPTOM  OR DIAGNOSIS REQUIRED)    Answer:  remove tube    Order Specific Question:  Preferred Imaging Location?    Answer:  [facility]  . CBC & Diff and Retic    Standing Status: Standing     Number of Occurrences: 2     Standing Expiration Date: 08/02/2014  . Comprehensive metabolic panel    Standing Status: Future     Number of Occurrences:      Standing Expiration Date: 08/01/2014  . Hold Tube, Blood Bank    Standing Status: Standing     Number of Occurrences: 2     Standing Expiration Date: 08/02/2014   All questions were answered. The patient knows to call the clinic with any problems, questions or concerns. No barriers to learning was detected. I spent 15 minutes counseling the patient face to face. The total time spent in the appointment was 20 minutes and more than 50% was on counseling and review of test results     , , MD 08/01/2013 1:20 PM    

## 2013-08-01 NOTE — Progress Notes (Signed)
Adrian [name] here for follow up after treatment to his epiglottis.  He denies pain.  He reports he has a little bit of a sore throat but it is much better.  He reports being able to eat by mouth and is only flushing his peg tube.  He will have it removed in the the next few weeks.  He denies a dry mouth.  He has gained 8 lbs since 07/11/13.  He is trying to drink at least 4 cans of ensure per day.  The skin on his neck is healed.  He is not using any skin cream at this time.  He reports his energy level is improving.

## 2013-08-06 ENCOUNTER — Other Ambulatory Visit (HOSPITAL_COMMUNITY): Payer: Self-pay

## 2013-08-09 ENCOUNTER — Ambulatory Visit (HOSPITAL_COMMUNITY)
Admission: RE | Admit: 2013-08-09 | Discharge: 2013-08-09 | Disposition: A | Discharge status: Home / Self Care | Payer: BC Managed Care – PPO | Source: Ambulatory Visit | Attending: Hematology and Oncology | Admitting: Hematology and Oncology

## 2013-08-09 DIAGNOSIS — Z431 Encounter for attention to gastrostomy: Secondary | ICD-10-CM | POA: Insufficient documentation

## 2013-08-09 DIAGNOSIS — D4620 Refractory anemia with excess of blasts, unspecified: Secondary | ICD-10-CM

## 2013-08-09 MED ORDER — LIDOCAINE VISCOUS 2 % MT SOLN
15.0000 mL | Freq: Once | OROMUCOSAL | Status: AC
  Administered 2013-08-09: 15 mL via OROMUCOSAL

## 2013-08-09 MED ORDER — LIDOCAINE VISCOUS 2 % MT SOLN
OROMUCOSAL | Status: AC
Start: 2013-08-09 — End: 2013-08-09
  Administered 2013-08-09: 15 mL via OROMUCOSAL
  Filled 2013-08-09: qty 15

## 2013-08-15 ENCOUNTER — Encounter (HOSPITAL_COMMUNITY): Payer: Self-pay | Admitting: Dentistry

## 2013-08-15 ENCOUNTER — Encounter (INDEPENDENT_AMBULATORY_CARE_PROVIDER_SITE_OTHER): Payer: Self-pay

## 2013-08-15 ENCOUNTER — Other Ambulatory Visit (HOSPITAL_BASED_OUTPATIENT_CLINIC_OR_DEPARTMENT_OTHER): Payer: BC Managed Care – PPO

## 2013-08-15 ENCOUNTER — Ambulatory Visit (HOSPITAL_COMMUNITY): Payer: Medicaid - Dental | Admitting: Dentistry

## 2013-08-15 VITALS — BP 109/68 | HR 88 | Temp 98.1°F | Wt 118.0 lb

## 2013-08-15 DIAGNOSIS — K000 Anodontia: Secondary | ICD-10-CM

## 2013-08-15 DIAGNOSIS — K117 Disturbances of salivary secretion: Secondary | ICD-10-CM

## 2013-08-15 DIAGNOSIS — C321 Malignant neoplasm of supraglottis: Secondary | ICD-10-CM

## 2013-08-15 DIAGNOSIS — D649 Anemia, unspecified: Secondary | ICD-10-CM

## 2013-08-15 DIAGNOSIS — K08109 Complete loss of teeth, unspecified cause, unspecified class: Secondary | ICD-10-CM

## 2013-08-15 DIAGNOSIS — Z0189 Encounter for other specified special examinations: Secondary | ICD-10-CM

## 2013-08-15 DIAGNOSIS — D4620 Refractory anemia with excess of blasts, unspecified: Secondary | ICD-10-CM

## 2013-08-15 DIAGNOSIS — R682 Dry mouth, unspecified: Secondary | ICD-10-CM

## 2013-08-15 LAB — CBC & DIFF AND RETIC
BASO%: 0.9 % (ref 0.0–2.0)
BASOS ABS: 0 10*3/uL (ref 0.0–0.1)
EOS ABS: 0 10*3/uL (ref 0.0–0.5)
EOS%: 1.4 % (ref 0.0–7.0)
HEMATOCRIT: 26 % — AB (ref 38.4–49.9)
HEMOGLOBIN: 8.7 g/dL — AB (ref 13.0–17.1)
Immature Retic Fract: 6.9 % (ref 3.00–10.60)
LYMPH%: 26.8 % (ref 14.0–49.0)
MCH: 37.7 pg — ABNORMAL HIGH (ref 27.2–33.4)
MCHC: 33.5 g/dL (ref 32.0–36.0)
MCV: 112.6 fL — AB (ref 79.3–98.0)
MONO#: 0.4 10*3/uL (ref 0.1–0.9)
MONO%: 18.3 % — AB (ref 0.0–14.0)
NEUT%: 52.6 % (ref 39.0–75.0)
NEUTROS ABS: 1.1 10*3/uL — AB (ref 1.5–6.5)
PLATELETS: 234 10*3/uL (ref 140–400)
RBC: 2.31 10*6/uL — ABNORMAL LOW (ref 4.20–5.82)
RDW: 19.9 % — ABNORMAL HIGH (ref 11.0–14.6)
RETIC %: 1.02 % (ref 0.80–1.80)
Retic Ct Abs: 23.56 10*3/uL — ABNORMAL LOW (ref 34.80–93.90)
WBC: 2.1 10*3/uL — AB (ref 4.0–10.3)
lymph#: 0.6 10*3/uL — ABNORMAL LOW (ref 0.9–3.3)

## 2013-08-15 LAB — HOLD TUBE, BLOOD BANK

## 2013-08-15 NOTE — Patient Instructions (Signed)
RECOMMENDATIONS: 1. Rinse mouth out after meals and at bedtime. Brush tongue daily. 2. Use trismus exercises as directed. 3. Use Biotene Rinse or salt water/baking soda rinses. 4. Multiple sips of water as needed. 5. Follow up with a dentist of his choice for fabrication of upper and lower complete dentures approximately 09/25/2013. Call if problems before then.  Lenn Cal, DDS  RADIATION THERAPY AND DECISIONS REGARDING YOUR TEETH  Xerostomia (dry mouth) Your salivary glands may be in the filed of radiation.  Radiation may include all or part of your saliva glands.  This will cause your saliva to dry up and you will have a dry mouth.  The dry mouth will be for the rest of your life unless your radiation oncologist tells you otherwise.  Your saliva has many functions:  Saliva wets your tongue for speaking.  It coats your teeth and the inside of your mouth for easier movement.  It helps with chewing and swallowing food.  It helps clean away harmful acid and toxic products made by the germs in your mouth, therefore it helps prevent cavities.  It kills some germs in your mouth and helps to prevent gum disease.  It helps to carry flavor to your taste buds.  Once you have lost your saliva you will be at higher risk for tooth decay and gum disease.  What can be done to help improve your mouth when there's not enough saliva:  1.  Your dentist may give a prescription for Salagen.  It will not bring back all of your saliva but may bring back some of it.  Also your saliva may be thick and ropy or white and foamy. It will not feel like it use to feel.  2.  You will need to swish with water every time your mouth feels dry.  YOU CANNOT suck on any cough drops, mints, lemon drops, candy, vitamin C or any other products.  You cannot use anything other than water to make your mouth feel less dry.  If you want to drink anything else you have to drink it all at once and brush afterwards.  Be  sure to discuss the details of your diet habits with your dentist or hygienist.  Radiation caries: This is decay that happens very quickly once your mouth is very dry due to radiation therapy.  Normally cavities take six months to two years to become a problem.  When you have dry mouth cavities may take as little as eight weeks to cause you a problem.  This is why dental check ups every two months are necessary as long as you have a dry mouth. Radiation caries typically, but not always, start at your gum line where it is hard to see the cavity.  It is therefore also hard to fill these cavities adequately.  This high rate of cavities happens because your mouth no longer has saliva and therefore the acid made by the germs starts the decay process.  Whenever you eat anything the germs in your mouth change the food into acid.  The acid then burns a small hole in your tooth.  This small hole is the beginning of a cavity.  If this is not treated then it will grow bigger and become a cavity.  The way to avoid this hole getting bigger is to use fluoride every evening as prescribed by your dentist.  You have to make sure that your teeth are very clean before you use the fluoride.  This  fluoride in turn will strengthen your teeth and prepare them for another day of fighting acid.  If you develop radiation caries many times the damage is so large that you will have to have all your teeth removed.  This could be a big problem if some of these teeth are in the field of radiation.  Further details of why this could be a big problem will follow.  (See Osteoradionecrosis).  Loss of taste (dysgeusia) This happens to varying degrees once you've had radiation therapy to your jaw region.  Many times taste is not completely lost but becomes limited.  The loss of taste is mostly due to radiation affecting your taste buds.  However if you have no saliva in your mouth to carry the flavor to your taste buds it would be difficult for  your taste buds to taste anything.  That is why using water or a prescription for Salagen prior to meals and during meals may help with some of the taste.  Keep in mind that taste generally returns very slowly over the course of several months or several years after radiation therapy.  Don't give up hope.  Trismus According to your Radiation Oncologist your TMJ or jaw joints are going to be partially or fully in the field of radiation.  This means that over time the muscles that help you open and close your mouth may get stiff.  This will potentially result in your not being able to open your mouth wide enough or as wide as you can open it now.  Le me give you an example of how slowly this happens and how unaware people are of it.  A gentlemen that had radiation therapy two years ago came back to me complaining that bananas are just too large for him to be able to fit them in between his teeth.  He was not able to open wide enough to bite into a banana.  This happens slowly and over a period of time.  What do we do to try and prevent this?  Your dentist will probably give you a stack of sticks called a trismus exercise device .  This stack will help your remind your muscles and your jaw joint to open up to the same distance every day.  Use these sticks every morning when you wake up according to the instructions given by the dentist.   You must use these sticks for at least one to two years after radiation therapy.  The reason for that is because it happens so slowly and keeps going on for about two years after radiation therapy.  Your hospital dentist will help you monitor your mouth opening and make sure that it's not getting smaller.  Osteoradionecrosis (ORN) This is a condition where your jaw bone after having had radiation therapy becomes very dry.  It has very little blood supply to keep it alive.  If you develop a cavity that turns into an abscess or an infection then the jaw bone does not have enough  blood supply to help fight the infection.  At this point it is very likely that the infection could cause the death of your jaw bone.  When you have dead bone it has to be removed.  Therefore you might end up having to have surgery to remove part of your jaw bone, the part of the jaw bone that has been affected.   Healing is also a problem if you are to have surgery in the areas  where the bone has had radiation therapy.  The same reasons apply.  If you have surgery you need more blood supply which is not available.  When blood supply and oxygen are not available again, there is a chance for the bone to die.  Occasionally ORN happens on its own with no obvious reason.  This is quite rare.  We believe that patients who continue to smoke and/or drink alcohol have a higher chance of having this bone problem.  Therefore once your jaw bone has had radiation therapy if there are any teeth in that area, you should never have them pulled.  You should also never have any surgery on your teeth or gums in that area unless the oral surgeon or Periodontist is aware of your history of radiation. There is some expensive management techniques that might be used to limit your risks.  The risks for ORN either from infection or spontaneous ( or on it's own) are life long.    TRISMUS  Trismus is a condition where the jaw does not allow the mouth to open as wide as it usually does.  This can happen almost suddenly, or in other cases the process is so slow, it is hard to notice it-until it is too far along.  When the jaw joints and/or muscles have been exposed to radiation treatments, the onset of Trismus is very slow.  This is because the muscles are losing their stretching ability over a long period of time, as long as 2 YEARS after the end of radiation.  It is therefore important to exercise these muscles and joints.  TRISMUS EXERCISES   Stack of tongue depressors measuring the same or a little less than the last  documented MIO (Maximum Interincisal Opening).  Secure them with a rubber band on both ends.  Place the stack in the patient's mouth, supporting the other end.  Allow 30 seconds for muscle stretching.  Rest for a few seconds.  Repeat 3-5 times  For all radiation patients, this exercise is recommended in the mornings and evenings unless otherwise instructed.  The exercise should be done for a period of 2 YEARS after the end of radiation.  MIO should be checked routinely on recall dental visits by the general dentist or the hospital dentist.  The patient is advised to report any changes, soreness, or difficulties encountered when doing the exercises.

## 2013-08-15 NOTE — Progress Notes (Signed)
08/15/2013  Patient:            Adrian [name] Date of Birth:  1951-02-26 MRN:                [id number]  BP 109/68  Pulse 88  Temp(Src) 98.1 F (36.7 C) (Oral)  Wt 118 lb (53.524 kg)  [name] presents for periodic oral examination after radiation therapy. Patient completed radiation therapy from 05/14/2051 06/28/2013.  REVIEW OF CHIEF COMPLAINTS:  DRY MOUTH: Sometimes in morning.  HARD TO SWALLOW: No  HURT TO SWALLOW: No TASTE CHANGES: Taste is acceptable SORES IN MOUTH: No TRISMUS: No problems with trismus WEIGHT: 118 pounds  HOME OH REGIMEN:  BRUSHING: Edentulous FLOSSING: Not applicable RINSING: Using salt water and Biotene rinses. FLUORIDE: Not applicable TRISMUS EXERCISES:  Maximum interincisal opening: 47 mm   DENTAL EXAM:  Oral Hygiene:(PLAQUE): Edentulous LOCATION OF MUCOSITIS: None noted DESCRIPTION OF SALIVA: Incipient xerostomia ANY EXPOSED BONE: None noted OTHER WATCHED AREAS: Previous extraction sites and pre-prosthetic surgery areas. DX: Xerostomia and Edentulous  RECOMMENDATIONS: 1. Rinse mouth out after meals and at bedtime. Brush tongue daily. 2. Use trismus exercises as directed. 3. Use Biotene Rinse or salt water/baking soda rinses. 4. Multiple sips of water as needed. 5. Follow up with a dentist of his choice for fabrication of upper and lower complete dentures approximately 09/25/2013. We provided a Price quote and discussed the process involved in making upper and lower complete dentures with dental medicine. The patient was given an appointment and he will cancel the appointment if he chooses to go to another dentist. Call if problems before then.  Lenn Cal, DDS

## 2013-08-27 ENCOUNTER — Ambulatory Visit (HOSPITAL_BASED_OUTPATIENT_CLINIC_OR_DEPARTMENT_OTHER): Payer: BC Managed Care – PPO | Admitting: Hematology and Oncology

## 2013-08-27 ENCOUNTER — Encounter: Payer: Self-pay | Admitting: Hematology and Oncology

## 2013-08-27 ENCOUNTER — Telehealth: Payer: Self-pay | Admitting: *Deleted

## 2013-08-27 ENCOUNTER — Telehealth: Payer: Self-pay | Admitting: Hematology and Oncology

## 2013-08-27 ENCOUNTER — Other Ambulatory Visit (HOSPITAL_BASED_OUTPATIENT_CLINIC_OR_DEPARTMENT_OTHER): Payer: BC Managed Care – PPO

## 2013-08-27 ENCOUNTER — Other Ambulatory Visit: Payer: Self-pay | Admitting: Hematology and Oncology

## 2013-08-27 ENCOUNTER — Encounter: Payer: Self-pay | Admitting: *Deleted

## 2013-08-27 VITALS — BP 118/64 | HR 90 | Temp 97.9°F | Resp 18 | Ht 72.0 in | Wt 118.7 lb

## 2013-08-27 DIAGNOSIS — C321 Malignant neoplasm of supraglottis: Secondary | ICD-10-CM

## 2013-08-27 DIAGNOSIS — D46Z Other myelodysplastic syndromes: Secondary | ICD-10-CM

## 2013-08-27 DIAGNOSIS — D469 Myelodysplastic syndrome, unspecified: Secondary | ICD-10-CM

## 2013-08-27 DIAGNOSIS — D649 Anemia, unspecified: Secondary | ICD-10-CM

## 2013-08-27 DIAGNOSIS — D4620 Refractory anemia with excess of blasts, unspecified: Secondary | ICD-10-CM

## 2013-08-27 LAB — CBC & DIFF AND RETIC
BASO%: 1 % (ref 0.0–2.0)
Basophils Absolute: 0 10*3/uL (ref 0.0–0.1)
EOS ABS: 0 10*3/uL (ref 0.0–0.5)
EOS%: 1.9 % (ref 0.0–7.0)
HEMATOCRIT: 25.4 % — AB (ref 38.4–49.9)
HGB: 8.3 g/dL — ABNORMAL LOW (ref 13.0–17.1)
Immature Retic Fract: 4.6 % (ref 3.00–10.60)
LYMPH#: 0.5 10*3/uL — AB (ref 0.9–3.3)
LYMPH%: 22.4 % (ref 14.0–49.0)
MCH: 37.4 pg — ABNORMAL HIGH (ref 27.2–33.4)
MCHC: 32.7 g/dL (ref 32.0–36.0)
MCV: 114.4 fL — ABNORMAL HIGH (ref 79.3–98.0)
MONO#: 0.3 10*3/uL (ref 0.1–0.9)
MONO%: 15.2 % — ABNORMAL HIGH (ref 0.0–14.0)
NEUT%: 59.5 % (ref 39.0–75.0)
NEUTROS ABS: 1.3 10*3/uL — AB (ref 1.5–6.5)
PLATELETS: 214 10*3/uL (ref 140–400)
RBC: 2.22 10*6/uL — ABNORMAL LOW (ref 4.20–5.82)
RDW: 19.3 % — ABNORMAL HIGH (ref 11.0–14.6)
RETIC %: 0.87 % (ref 0.80–1.80)
Retic Ct Abs: 19.31 10*3/uL — ABNORMAL LOW (ref 34.80–93.90)
WBC: 2.1 10*3/uL — AB (ref 4.0–10.3)

## 2013-08-27 LAB — COMPREHENSIVE METABOLIC PANEL (CC13)
AST: 12 U/L (ref 5–34)
Albumin: 3.3 g/dL — ABNORMAL LOW (ref 3.5–5.0)
Alkaline Phosphatase: 147 U/L (ref 40–150)
Anion Gap: 10 mEq/L (ref 3–11)
BUN: 17.6 mg/dL (ref 7.0–26.0)
CHLORIDE: 112 meq/L — AB (ref 98–109)
CO2: 19 mEq/L — ABNORMAL LOW (ref 22–29)
CREATININE: 0.8 mg/dL (ref 0.7–1.3)
Calcium: 9.2 mg/dL (ref 8.4–10.4)
Glucose: 108 mg/dl (ref 70–140)
Potassium: 4.2 mEq/L (ref 3.5–5.1)
SODIUM: 141 meq/L (ref 136–145)
Total Bilirubin: 0.21 mg/dL (ref 0.20–1.20)
Total Protein: 8 g/dL (ref 6.4–8.3)

## 2013-08-27 LAB — HOLD TUBE, BLOOD BANK

## 2013-08-27 NOTE — Progress Notes (Signed)
To provide support and care continuity, met with patient and his wife during established pt appt with Dr. Alvy Bimler.  They did not express any needs or concerns.  Continuing to navigate as L3 (treatments completed) patient.  Gayleen Orem, RN, BSN, [facility] Oncology Navigator [phone]

## 2013-08-27 NOTE — Progress Notes (Signed)
[place] [facility] OFFICE PROGRESS NOTE  Patient Care Team: [name], MD as PCP - General (Family Medicine) [name], MD as Consulting Physician (Hematology and Oncology) Brooks Sailors, RN as Registered Nurse (Oncology)  DIAGNOSIS: Myelodysplastic syndrome and recent treatment for laryngeal cancer  SUMMARY OF ONCOLOGIC HISTORY: Oncology History   MDS, R-IPSS score of 3, low risk (Hg 8.9, +16 chromosome on BM, 2% blast count)   Squamous cell carcinoma of the epiglottis   Primary site: Larynx - Supraglottis (Left)   Staging method: AJCC 7th Edition   Clinical: Stage I (T1, N0, M0) signed by [name], MD on 04/30/2013 12:49 PM   Pathologic: Stage I (T1, N0, cM0) signed by [name], MD on 04/30/2013 12:49 PM   Summary: Stage I (T1, N0, cM0)       MDS (myelodysplastic syndrome), low grade   02/01/2007 Bone Marrow Biopsy BM biopsy was abnormal, overall probable low grade MDS   03/23/2011 - 02/06/2013 Chemotherapy [name], discontinued due to diagnosis of laryngeal ca    Squamous cell carcinoma of the epiglottis   03/06/2013 Procedure Biopsy from epiglottic region showed invasive [facility]   04/25/2013 Imaging Ct scan showed no other involvement in the LN   05/13/2013 - 06/28/2013 Radiation Therapy He received radiation therapy, 70 gray in 35 fractions to the larynx    INTERVAL HISTORY: Adrian [name] 63 y.o. male returns for further followup. Feeding tube was removed. He continue to feel well. Denies any symptoms of anemia. Denies any recent bleeding.  I have reviewed the past medical history, past surgical history, social history and family history with the patient and they are unchanged from previous note.  ALLERGIES:  has No Known Allergies.  MEDICATIONS:  Current Outpatient Prescriptions  Medication Sig Dispense Refill  . cyanocobalamin 500 MCG tablet Take 500 mcg by mouth daily.      . diazepam (VALIUM) 5 MG tablet Take 5 mg by mouth  every 6 (six) hours as needed for anxiety.      Marland Kitchen emollient (BIAFINE) cream Apply 1 application topically daily. Apply to affected skin area on neck after rad txs when skin becomes irritated ot itching,dryness daily      . Fe Fum-Vit C-Vit B12-FA (HEMATOGEN FORTE PO) Take 1 capsule by mouth daily.      . folic acid (FOLVITE) 1 MG tablet Place 1 tablet (1 mg total) into feeding tube daily.      Marland Kitchen HYDROcodone-acetaminophen (HYCET) 7.5-325 mg/15 ml solution Take 10 mLs by mouth 4 (four) times daily as needed for moderate pain.  120 mL  0  . mometasone (NASONEX) 50 MCG/ACT nasal spray Place 2 sprays into both nostrils daily.      . phenytoin (DILANTIN) 125 MG/5ML suspension Place 8 mLs (200 mg total) into feeding tube 2 (two) times daily.  237 mL  12  . silver sulfADIAZINE (SILVADENE) 1 % cream Apply topically 2 (two) times daily. Apply to the neck  50 g  0  . vitamin E 1000 UNIT capsule Take 500 Units by mouth daily.       No current facility-administered medications for this visit.    REVIEW OF SYSTEMS:   Constitutional: Denies fevers, chills or abnormal weight loss Eyes: Denies blurriness of vision Ears, nose, mouth, throat, and face: Denies mucositis or sore throat Respiratory: Denies cough, dyspnea or wheezes Cardiovascular: Denies palpitation, chest discomfort or lower extremity swelling Gastrointestinal:  Denies nausea, heartburn or change in bowel habits Skin: Denies  abnormal skin rashes Lymphatics: Denies new lymphadenopathy or easy bruising Neurological:Denies numbness, tingling or new weaknesses Behavioral/Psych: Mood is stable, no new changes  All other systems were reviewed with the patient and are negative.  PHYSICAL EXAMINATION: ECOG PERFORMANCE STATUS: 0 - Asymptomatic  Filed Vitals:   08/27/13 1247  BP: 118/64  Pulse: 90  Temp: 97.9 F (36.6 C)  Resp: 18   Filed Weights   08/27/13 1247  Weight: 118 lb 11.2 oz (53.842 kg)    GENERAL:alert, no distress and  comfortable. He looks thin and cachectic SKIN: skin color, texture, turgor are normal, no rashes or significant lesions EYES: normal, Conjunctiva are pink and non-injected, sclera clear OROPHARYNX:no exudate, no erythema and lips, buccal mucosa, and tongue normal  NECK: supple, thyroid normal size, non-tender, without nodularity LYMPH:  no palpable lymphadenopathy in the cervical, axillary or inguinal LUNGS: clear to auscultation and percussion with normal breathing effort HEART: regular rate & rhythm and no murmurs and no lower extremity edema ABDOMEN:abdomen soft, non-tender and normal bowel sounds Musculoskeletal:no cyanosis of digits and no clubbing  NEURO: alert & oriented x 3 with fluent speech, no focal motor/sensory deficits  LABORATORY DATA:  I have reviewed the data as listed    Component Value Date/Time   NA 142 08/01/2013 1228   NA 139 07/07/2013 0545   K 4.4 08/01/2013 1228   K 4.0 07/07/2013 0545   CL 101 07/07/2013 0545   CL 107 09/19/2012 1325   CO2 22 08/01/2013 1228   CO2 25 07/07/2013 0545   GLUCOSE 95 08/01/2013 1228   GLUCOSE 81 07/07/2013 0545   GLUCOSE 97 09/19/2012 1325   BUN 16.1 08/01/2013 1228   BUN 25* 07/07/2013 0545   CREATININE 0.7 08/01/2013 1228   CREATININE 0.67 07/07/2013 0545   CALCIUM 9.0 08/01/2013 1228   CALCIUM 8.5 07/07/2013 0545   PROT 7.2 07/07/2013 0545   PROT 8.4* 05/17/2013 1138   ALBUMIN 2.4* 07/07/2013 0545   ALBUMIN 3.2* 05/17/2013 1138   AST 19 07/07/2013 0545   AST 11 05/17/2013 1138   ALT 14 07/07/2013 0545   ALT 7 05/17/2013 1138   ALKPHOS 121* 07/07/2013 0545   ALKPHOS 175* 05/17/2013 1138   BILITOT 0.2* 07/07/2013 0545   BILITOT 0.23 05/17/2013 1138   GFRNONAA >90 07/07/2013 0545   GFRAA >90 07/07/2013 0545    No results found for this basename: SPEP,  UPEP,   kappa and lambda light chains    Lab Results  Component Value Date   WBC 2.1* 08/27/2013   NEUTROABS 1.3* 08/27/2013   HGB 8.3* 08/27/2013   HCT 25.4* 08/27/2013   MCV 114.4* 08/27/2013   PLT 214  08/27/2013      Chemistry      Component Value Date/Time   NA 142 08/01/2013 1228   NA 139 07/07/2013 0545   K 4.4 08/01/2013 1228   K 4.0 07/07/2013 0545   CL 101 07/07/2013 0545   CL 107 09/19/2012 1325   CO2 22 08/01/2013 1228   CO2 25 07/07/2013 0545   BUN 16.1 08/01/2013 1228   BUN 25* 07/07/2013 0545   CREATININE 0.7 08/01/2013 1228   CREATININE 0.67 07/07/2013 0545      Component Value Date/Time   CALCIUM 9.0 08/01/2013 1228   CALCIUM 8.5 07/07/2013 0545   ALKPHOS 121* 07/07/2013 0545   ALKPHOS 175* 05/17/2013 1138   AST 19 07/07/2013 0545   AST 11 05/17/2013 1138   ALT 14 07/07/2013 0545   ALT  7 05/17/2013 1138   BILITOT 0.2* 07/07/2013 0545   BILITOT 0.23 05/17/2013 1138     ASSESSMENT & PLAN:  #1 low-grade myelodysplastic syndrome with severe anemia and leukopenia The patient was receiving erythropoietin stimulating agents up until recently. Due to recent diagnosis of head and neck cancer, I have discontinued erythropoietin stimulating agents due to risk of promoting cancer growth.  Repeat blood work today showed that anemia is stable. He does not require transfusion. I discussed with the patient the risks, benefits and side effects of repeating bone marrow aspirate and biopsy and he agreed to proceed. The patient will like a sedated bone marrow biopsy. #2 epiglottic cancer T1, N0, M0 He has recovered fully. Continue followup by radiation oncologist #3 weight loss, resolved Since his sore throat is improving, he is able to tolerate some soft diet. I recommend the patient to continue to increase oral diet intake.  Orders Placed This Encounter  Procedures  . Comprehensive metabolic panel    Standing Status: Future     Number of Occurrences:      Standing Expiration Date: 08/27/2014  . CBC with Differential    Standing Status: Future     Number of Occurrences:      Standing Expiration Date: 08/27/2014  . Ferritin    Standing Status: Future     Number of Occurrences:      Standing Expiration  Date: 08/27/2014  . Erythropoietin    Standing Status: Future     Number of Occurrences:      Standing Expiration Date: 08/27/2014  . Vitamin B12    Standing Status: Future     Number of Occurrences:      Standing Expiration Date: 08/27/2014  . Hold Tube, Blood Bank    Standing Status: Future     Number of Occurrences:      Standing Expiration Date: 08/27/2014   All questions were answered. The patient knows to call the clinic with any problems, questions or concerns. No barriers to learning was detected. I spent 40 minutes counseling the patient face to face. The total time spent in the appointment was 55 minutes and more than 50% was on counseling and review of test results     [name], MD 08/27/2013 1:10 PM

## 2013-08-27 NOTE — Telephone Encounter (Signed)
gave pt appt for lab and MD  for May 2015

## 2013-08-27 NOTE — Telephone Encounter (Signed)
Asked wife if ok for her to bring pt for BMBx on 5/01?  She says to schedule it and she will arrange to bring pt.  Instructed Nothing to eat/drink or by PEG tube after midnight.  Arrive to short stay at 7 am and will need driver home.  She verbalized understanding.   Notified Butch Penny in [employer].

## 2013-08-27 NOTE — Telephone Encounter (Signed)
Message copied by Cathlean Cower on Tue Aug 27, 2013  2:06 PM ------      Message from: [facility], [place]: Tue Aug 27, 2013  1:07 PM      Regarding: SS BM biopsy       Can we try 4/30? ------

## 2013-08-30 ENCOUNTER — Encounter (HOSPITAL_COMMUNITY): Payer: Self-pay | Admitting: Pharmacy Technician

## 2013-09-06 ENCOUNTER — Ambulatory Visit: Payer: BC Managed Care – PPO

## 2013-09-06 ENCOUNTER — Other Ambulatory Visit: Payer: Self-pay | Admitting: Hematology and Oncology

## 2013-09-06 ENCOUNTER — Ambulatory Visit (HOSPITAL_COMMUNITY)
Admission: RE | Admit: 2013-09-06 | Discharge: 2013-09-06 | Disposition: A | Discharge status: Home / Self Care | Payer: BC Managed Care – PPO | Source: Ambulatory Visit | Attending: Hematology and Oncology | Admitting: Hematology and Oncology

## 2013-09-06 ENCOUNTER — Encounter (HOSPITAL_COMMUNITY): Payer: Self-pay

## 2013-09-06 VITALS — BP 128/80 | HR 75 | Temp 97.7°F | Resp 20 | Ht 72.0 in | Wt 118.0 lb

## 2013-09-06 VITALS — BP 127/56 | HR 78 | Temp 97.3°F

## 2013-09-06 DIAGNOSIS — D4620 Refractory anemia with excess of blasts, unspecified: Secondary | ICD-10-CM

## 2013-09-06 DIAGNOSIS — D469 Myelodysplastic syndrome, unspecified: Secondary | ICD-10-CM

## 2013-09-06 DIAGNOSIS — D649 Anemia, unspecified: Secondary | ICD-10-CM | POA: Insufficient documentation

## 2013-09-06 LAB — CBC WITH DIFFERENTIAL/PLATELET
Basophils Absolute: 0 10*3/uL (ref 0.0–0.1)
Basophils Relative: 1 % (ref 0–1)
EOS PCT: 2 % (ref 0–5)
Eosinophils Absolute: 0.1 10*3/uL (ref 0.0–0.7)
HCT: 23.2 % — ABNORMAL LOW (ref 39.0–52.0)
HEMOGLOBIN: 8 g/dL — AB (ref 13.0–17.0)
LYMPHS ABS: 0.6 10*3/uL — AB (ref 0.7–4.0)
Lymphocytes Relative: 21 % (ref 12–46)
MCH: 38.8 pg — ABNORMAL HIGH (ref 26.0–34.0)
MCHC: 34.5 g/dL (ref 30.0–36.0)
MCV: 112.6 fL — AB (ref 78.0–100.0)
Monocytes Absolute: 0.4 10*3/uL (ref 0.1–1.0)
Monocytes Relative: 12 % (ref 3–12)
Neutro Abs: 1.9 10*3/uL (ref 1.7–7.7)
Neutrophils Relative %: 64 % (ref 43–77)
Platelets: 228 10*3/uL (ref 150–400)
RBC: 2.06 MIL/uL — ABNORMAL LOW (ref 4.22–5.81)
RDW: 18.9 % — ABNORMAL HIGH (ref 11.5–15.5)
WBC: 3 10*3/uL — ABNORMAL LOW (ref 4.0–10.5)

## 2013-09-06 LAB — SAMPLE TO BLOOD BANK

## 2013-09-06 LAB — BONE MARROW EXAM

## 2013-09-06 MED ORDER — DARBEPOETIN ALFA-POLYSORBATE 300 MCG/0.6ML IJ SOLN
300.0000 ug | Freq: Once | INTRAMUSCULAR | Status: AC
  Administered 2013-09-06: 300 ug via SUBCUTANEOUS
  Filled 2013-09-06: qty 0.6

## 2013-09-06 MED ORDER — MIDAZOLAM HCL 10 MG/2ML IJ SOLN
10.0000 mg | Freq: Once | INTRAMUSCULAR | Status: DC
  Filled 2013-09-06: qty 2

## 2013-09-06 MED ORDER — SODIUM CHLORIDE 0.9 % IV SOLN
INTRAVENOUS | Status: DC
Start: 2013-09-06 — End: 2013-09-07
  Administered 2013-09-06: 10 mL/h via INTRAVENOUS

## 2013-09-06 MED ORDER — MORPHINE SULFATE 10 MG/ML IJ SOLN
INTRAMUSCULAR | Status: AC | PRN
  Administered 2013-09-06: 1 mg via INTRAVENOUS
  Administered 2013-09-06: 2 mg via INTRAVENOUS

## 2013-09-06 MED ORDER — MORPHINE SULFATE 10 MG/ML IJ SOLN
10.0000 mg | Freq: Once | INTRAMUSCULAR | Status: DC
  Filled 2013-09-06: qty 1

## 2013-09-06 MED ORDER — MIDAZOLAM HCL 2 MG/2ML IJ SOLN
INTRAMUSCULAR | Status: AC | PRN
  Administered 2013-09-06: 4 mg via INTRAVENOUS

## 2013-09-06 NOTE — Procedures (Signed)
Brief examination was performed. ENT: adequate airway clearance Heart: regular rate and rhythm.No Murmurs Lungs: clear to auscultation, no wheezes, normal respiratory effort  American Society of Anesthesiologists ASA scale 2  Mallampati Score of 4  Bone Marrow Biopsy and Aspiration Procedure Note   Informed consent was obtained and potential risks including bleeding, infection and pain were reviewed with the patient. I verified that the patient has been fasting since midnight.  The patient's name, date of birth, identification, consent and allergies were verified prior to the start of procedure and time out was performed.  A total of 4 mg of IV Versed and 3 mg of IV morphine were given.  The right posterior iliac crest was chosen as the site of biopsy.  The skin was prepped with Betadine solution.   8 cc of 1% lidocaine was used to provide local anaesthesia.   10 cc of bone marrow aspirate was obtained followed by 1 inch biopsy.   The procedure was tolerated well and there were no complications.  The patient was stable at the end of the procedure.  Specimens sent for flow cytometry, cytogenetics and additional studies. The patient is noted to be anemic with hemoglobin of 8 g. I recommend him to stop by my office this morning for one dose of 300 mcg darbepoetin injection.

## 2013-09-06 NOTE — Discharge Instructions (Signed)
Conscious Sedation, Adult, Care After °Refer to this sheet in the next few weeks. These instructions provide you with information on caring for yourself after your procedure. Your health care provider may also give you more specific instructions. Your treatment has been planned according to current medical practices, but problems sometimes occur. Call your health care provider if you have any problems or questions after your procedure. °WHAT TO EXPECT AFTER THE PROCEDURE  °After your procedure: °· You may feel sleepy, clumsy, and have poor balance for several hours. °· Vomiting may occur if you eat too soon after the procedure. °HOME CARE INSTRUCTIONS °· Do not participate in any activities where you could become injured for at least 24 hours. Do not: °· Drive. °· Swim. °· Ride a bicycle. °· Operate heavy machinery. °· Cook. °· Use power tools. °· Climb ladders. °· Work from a high place. °· Do not make important decisions or sign legal documents until you are improved. °· If you vomit, drink water, juice, or soup when you can drink without vomiting. Make sure you have little or no nausea before eating solid foods. °· Only take over-the-counter or prescription medicines for pain, discomfort, or fever as directed by your health care provider. °· Make sure you and your family fully understand everything about the medicines given to you, including what side effects may occur. °· You should not drink alcohol, take sleeping pills, or take medicines that cause drowsiness for at least 24 hours. °· If you smoke, do not smoke without supervision. °· If you are feeling better, you may resume normal activities 24 hours after you were sedated. °· Keep all appointments with your health care provider. °SEEK MEDICAL CARE IF: °· Your skin is pale or bluish in color. °· You continue to feel nauseous or vomit. °· Your pain is getting worse and is not helped by medicine. °· You have bleeding or swelling. °· You are still sleepy or  feeling clumsy after 24 hours. °SEEK IMMEDIATE MEDICAL CARE IF: °· You develop a rash. °· You have difficulty breathing. °· You develop any type of allergic problem. °· You have a fever. °MAKE SURE YOU: °· Understand these instructions. °· Will watch your condition. °· Will get help right away if you are not doing well or get worse. °Document Released: 02/13/2013 Document Reviewed: 11/30/2012 °[employer]® Patient Information ©2014 [employer]. °Bone Marrow Aspiration, Bone Marrow Biopsy °Care After °Read the instructions outlined below and refer to this sheet in the next few weeks. These discharge instructions provide you with general information on caring for yourself after you leave the hospital. Your caregiver may also give you specific instructions. While your treatment has been planned according to the most current medical practices available, unavoidable complications occasionally occur. If you have any problems or questions after discharge, call your caregiver. °FINDING OUT THE RESULTS OF YOUR TEST °Not all test results are available during your visit. If your test results are not back during the visit, make an appointment with your caregiver to find out the results. Do not assume everything is normal if you have not heard from your caregiver or the medical facility. It is important for you to follow up on all of your test results.  °HOME CARE INSTRUCTIONS  °You have had sedation and may be sleepy or dizzy. Your thinking may not be as clear as usual. For the next 24 hours: °· Only take over-the-counter or prescription medicines for pain, discomfort, and or fever as directed by your caregiver. °·   Do not drink alcohol.  Do not smoke.  Do not drive.  Do not make important legal decisions.  Do not operate heavy machinery.  Do not care for small children by yourself.  Keep your dressing clean and dry. You may replace dressing with a bandage after 24 hours.  You may take a bath or shower after 24  hours.  Use an ice pack for 20 minutes every 2 hours while awake for pain as needed. SEEK MEDICAL CARE IF:   There is redness, swelling, or increasing pain at the biopsy site.  There is pus coming from the biopsy site.  There is drainage from a biopsy site lasting longer than one day.  An unexplained oral temperature above 102 F (38.9 C) develops. SEEK IMMEDIATE MEDICAL CARE IF:   You develop a rash.  You have difficulty breathing.  You develop any reaction or side effects to medications given. Document Released: 11/12/2004 Document Revised: 07/18/2011 Document Reviewed: 04/22/2008 [facility] Patient Information 2014 [employer].

## 2013-09-06 NOTE — Sedation Documentation (Signed)
Bandaid clean,dry, intact to right iliac crest.

## 2013-09-06 NOTE — Sedation Documentation (Signed)
Patient denies pain and is resting comfortably.  

## 2013-09-06 NOTE — Sedation Documentation (Signed)
MD at bedside. 

## 2013-09-13 LAB — CHROMOSOME ANALYSIS, BONE MARROW

## 2013-09-13 LAB — TISSUE HYBRIDIZATION (BONE MARROW)-NCBH

## 2013-09-17 ENCOUNTER — Ambulatory Visit (HOSPITAL_BASED_OUTPATIENT_CLINIC_OR_DEPARTMENT_OTHER): Payer: BC Managed Care – PPO | Admitting: Hematology and Oncology

## 2013-09-17 ENCOUNTER — Telehealth: Payer: Self-pay | Admitting: Hematology and Oncology

## 2013-09-17 ENCOUNTER — Encounter: Payer: Self-pay | Admitting: Hematology and Oncology

## 2013-09-17 ENCOUNTER — Other Ambulatory Visit (HOSPITAL_BASED_OUTPATIENT_CLINIC_OR_DEPARTMENT_OTHER): Payer: BC Managed Care – PPO

## 2013-09-17 VITALS — BP 143/73 | HR 96 | Temp 98.2°F | Resp 18 | Ht 72.0 in | Wt 121.2 lb

## 2013-09-17 DIAGNOSIS — C321 Malignant neoplasm of supraglottis: Secondary | ICD-10-CM

## 2013-09-17 DIAGNOSIS — D4620 Refractory anemia with excess of blasts, unspecified: Secondary | ICD-10-CM

## 2013-09-17 DIAGNOSIS — D649 Anemia, unspecified: Secondary | ICD-10-CM

## 2013-09-17 DIAGNOSIS — D72819 Decreased white blood cell count, unspecified: Secondary | ICD-10-CM

## 2013-09-17 LAB — COMPREHENSIVE METABOLIC PANEL (CC13)
ALBUMIN: 3.5 g/dL (ref 3.5–5.0)
ALK PHOS: 151 U/L — AB (ref 40–150)
AST: 10 U/L (ref 5–34)
Anion Gap: 10 mEq/L (ref 3–11)
BUN: 17.4 mg/dL (ref 7.0–26.0)
CO2: 20 mEq/L — ABNORMAL LOW (ref 22–29)
Calcium: 9.3 mg/dL (ref 8.4–10.4)
Chloride: 109 mEq/L (ref 98–109)
Creatinine: 0.9 mg/dL (ref 0.7–1.3)
Glucose: 77 mg/dl (ref 70–140)
Potassium: 4.1 mEq/L (ref 3.5–5.1)
Sodium: 139 mEq/L (ref 136–145)
Total Bilirubin: 0.23 mg/dL (ref 0.20–1.20)
Total Protein: 8.5 g/dL — ABNORMAL HIGH (ref 6.4–8.3)

## 2013-09-17 LAB — CBC WITH DIFFERENTIAL/PLATELET
BASO%: 1.1 % (ref 0.0–2.0)
Basophils Absolute: 0 10*3/uL (ref 0.0–0.1)
EOS%: 2.5 % (ref 0.0–7.0)
Eosinophils Absolute: 0.1 10*3/uL (ref 0.0–0.5)
HCT: 27.5 % — ABNORMAL LOW (ref 38.4–49.9)
HGB: 9.4 g/dL — ABNORMAL LOW (ref 13.0–17.1)
LYMPH%: 18.1 % (ref 14.0–49.0)
MCH: 40.1 pg — AB (ref 27.2–33.4)
MCHC: 34 g/dL (ref 32.0–36.0)
MCV: 118 fL — AB (ref 79.3–98.0)
MONO#: 0.4 10*3/uL (ref 0.1–0.9)
MONO%: 14 % (ref 0.0–14.0)
NEUT%: 64.3 % (ref 39.0–75.0)
NEUTROS ABS: 1.6 10*3/uL (ref 1.5–6.5)
PLATELETS: 332 10*3/uL (ref 140–400)
RBC: 2.33 10*6/uL — AB (ref 4.20–5.82)
RDW: 20.2 % — ABNORMAL HIGH (ref 11.0–14.6)
WBC: 2.5 10*3/uL — ABNORMAL LOW (ref 4.0–10.3)
lymph#: 0.5 10*3/uL — ABNORMAL LOW (ref 0.9–3.3)

## 2013-09-17 LAB — HOLD TUBE, BLOOD BANK

## 2013-09-17 LAB — FERRITIN CHCC: FERRITIN: 367 ng/mL — AB (ref 22–316)

## 2013-09-17 MED ORDER — DARBEPOETIN ALFA-POLYSORBATE 300 MCG/0.6ML IJ SOLN
300.0000 ug | Freq: Once | INTRAMUSCULAR | Status: AC
  Administered 2013-09-17: 300 ug via SUBCUTANEOUS
  Filled 2013-09-17: qty 0.6

## 2013-09-17 NOTE — Telephone Encounter (Signed)
gv adn rpinted aptp sched and avs for pt for June thru Aug

## 2013-09-17 NOTE — Progress Notes (Signed)
[place] OFFICE PROGRESS NOTE  Patient Care Team: [name], MD as PCP - General (Family Medicine) [name], MD as Consulting Physician (Hematology and Oncology) Brooks Sailors, RN as Registered Nurse (Oncology)  DIAGNOSIS: Low-grade myelodysplastic syndrome with anemia  SUMMARY OF ONCOLOGIC HISTORY: Oncology History   MDS, R-IPSS score of 3, low risk (Hg 8.9, +16 chromosome on BM, 2% blast count)   Squamous cell carcinoma of the epiglottis   Primary site: Larynx - Supraglottis (Left)   Staging method: AJCC 7th Edition   Clinical: Stage I (T1, N0, M0) signed by [name], MD on 04/30/2013 12:49 PM   Pathologic: Stage I (T1, N0, cM0) signed by [name], MD on 04/30/2013 12:49 PM   Summary: Stage I (T1, N0, cM0)       MDS (myelodysplastic syndrome), low grade   02/01/2007 Bone Marrow Biopsy BM biopsy was abnormal, overall probable low grade MDS   03/23/2011 - 02/06/2013 Chemotherapy [name], discontinued due to diagnosis of laryngeal ca    Squamous cell carcinoma of the epiglottis   03/06/2013 Procedure Biopsy from epiglottic region showed invasive [facility]   04/25/2013 Imaging Ct scan showed no other involvement in the LN   05/13/2013 - 06/28/2013 Radiation Therapy He received radiation therapy, 70 gray in 35 fractions to the larynx    INTERVAL HISTORY: Adrian [name] 63 y.o. male returns for further followup. He continued to gain weight. He denies any symptoms of anemia. Denies any chest pain, dizziness or shortness of breath.  I have reviewed the past medical history, past surgical history, social history and family history with the patient and they are unchanged from previous note.  ALLERGIES:  has No Known Allergies.  MEDICATIONS:  Current Outpatient Prescriptions  Medication Sig Dispense Refill  . cyanocobalamin 500 MCG tablet Take 500 mcg by mouth daily.      . diazepam (VALIUM) 5 MG tablet Take 10 mg by mouth every 6 (six)  hours as needed for anxiety.       . ferrous sulfate 325 (65 FE) MG tablet Take 325 mg by mouth daily with breakfast.      . folic acid (FOLVITE) 1 MG tablet Take 1 mg by mouth daily.      . mometasone (NASONEX) 50 MCG/ACT nasal spray Place 2 sprays into both nostrils daily.      . phenytoin (DILANTIN) 200 MG ER capsule Take 200 mg by mouth 2 (two) times daily.       . vitamin E 1000 UNIT capsule Take 1,000 Units by mouth daily.        No current facility-administered medications for this visit.    REVIEW OF SYSTEMS:   Constitutional: Denies fevers, chills or abnormal weight loss Eyes: Denies blurriness of vision Ears, nose, mouth, throat, and face: Denies mucositis or sore throat Respiratory: Denies cough, dyspnea or wheezes Cardiovascular: Denies palpitation, chest discomfort or lower extremity swelling Gastrointestinal:  Denies nausea, heartburn or change in bowel habits Skin: Denies abnormal skin rashes Lymphatics: Denies new lymphadenopathy or easy bruising Neurological:Denies numbness, tingling or new weaknesses Behavioral/Psych: Mood is stable, no new changes  All other systems were reviewed with the patient and are negative.  PHYSICAL EXAMINATION: ECOG PERFORMANCE STATUS: 0 - Asymptomatic  Filed Vitals:   09/17/13 1226  BP: 143/73  Pulse: 96  Temp: 98.2 F (36.8 C)  Resp: 18   Filed Weights   09/17/13 1226  Weight: 121 lb 3.2 oz (54.976 kg)  GENERAL:alert, no distress and comfortable. He looks thin and mildly cachectic SKIN: skin color, texture, turgor are normal, no rashes or significant lesions EYES: normal, Conjunctiva are pale and non-injected, sclera clear Musculoskeletal:no cyanosis of digits and no clubbing  NEURO: alert & oriented x 3 with fluent speech, no focal motor/sensory deficits  LABORATORY DATA:  I have reviewed the data as listed    Component Value Date/Time   NA 139 09/17/2013 1158   NA 139 07/07/2013 0545   K 4.1 09/17/2013 1158   K 4.0  07/07/2013 0545   CL 101 07/07/2013 0545   CL 107 09/19/2012 1325   CO2 20* 09/17/2013 1158   CO2 25 07/07/2013 0545   GLUCOSE 77 09/17/2013 1158   GLUCOSE 81 07/07/2013 0545   GLUCOSE 97 09/19/2012 1325   BUN 17.4 09/17/2013 1158   BUN 25* 07/07/2013 0545   CREATININE 0.9 09/17/2013 1158   CREATININE 0.67 07/07/2013 0545   CALCIUM 9.3 09/17/2013 1158   CALCIUM 8.5 07/07/2013 0545   PROT 8.5* 09/17/2013 1158   PROT 7.2 07/07/2013 0545   ALBUMIN 3.5 09/17/2013 1158   ALBUMIN 2.4* 07/07/2013 0545   AST 10 09/17/2013 1158   AST 19 07/07/2013 0545   ALT <6 09/17/2013 1158   ALT 14 07/07/2013 0545   ALKPHOS 151* 09/17/2013 1158   ALKPHOS 121* 07/07/2013 0545   BILITOT 0.23 09/17/2013 1158   BILITOT 0.2* 07/07/2013 0545   GFRNONAA >90 07/07/2013 0545   GFRAA >90 07/07/2013 0545    No results found for this basename: SPEP,  UPEP,   kappa and lambda light chains    Lab Results  Component Value Date   WBC 2.5* 09/17/2013   NEUTROABS 1.6 09/17/2013   HGB 9.4* 09/17/2013   HCT 27.5* 09/17/2013   MCV 118.0* 09/17/2013   PLT 332 09/17/2013      Chemistry      Component Value Date/Time   NA 139 09/17/2013 1158   NA 139 07/07/2013 0545   K 4.1 09/17/2013 1158   K 4.0 07/07/2013 0545   CL 101 07/07/2013 0545   CL 107 09/19/2012 1325   CO2 20* 09/17/2013 1158   CO2 25 07/07/2013 0545   BUN 17.4 09/17/2013 1158   BUN 25* 07/07/2013 0545   CREATININE 0.9 09/17/2013 1158   CREATININE 0.67 07/07/2013 0545      Component Value Date/Time   CALCIUM 9.3 09/17/2013 1158   CALCIUM 8.5 07/07/2013 0545   ALKPHOS 151* 09/17/2013 1158   ALKPHOS 121* 07/07/2013 0545   AST 10 09/17/2013 1158   AST 19 07/07/2013 0545   ALT <6 09/17/2013 1158   ALT 14 07/07/2013 0545   BILITOT 0.23 09/17/2013 1158   BILITOT 0.2* 07/07/2013 0545     ASSESSMENT & PLAN:  #1 low-grade myelodysplastic syndrome with severe anemia and leukopenia The patient was receiving erythropoietin stimulating agents up until recently. Due to recent diagnosis of head and neck cancer, I have  discontinued erythropoietin stimulating agents due to risk of promoting cancer growth.  Repeat blood work today showed that anemia is stable. He does not require transfusion. Repeat bone marrow aspirate and biopsy confirmed this is a low-grade myelodysplastic syndrome with no evidence of increased blasts. I will present his case at the next hematology tumor board. I would like to resume darbopoeitin 300 mcg every 3 weeks to bring his hemoglobin greater than 10. #2 epiglottic cancer T1, N0, M0 He has recovered fully. Continue followup by radiation oncologist #3 leukopenia This  is related to problem #1. He is not symptomatic. Recommend observation only.  Orders Placed This Encounter  Procedures  . CBC & Diff and Retic    Standing Status: Standing     Number of Occurrences: 11     Standing Expiration Date: 09/18/2014   All questions were answered. The patient knows to call the clinic with any problems, questions or concerns. No barriers to learning was detected. I spent 25 minutes counseling the patient face to face. The total time spent in the appointment was 30 minutes and more than 50% was on counseling and review of test results     [name], MD 09/17/2013 8:14 PM

## 2013-09-19 LAB — ERYTHROPOIETIN: Erythropoietin: 502.6 m[IU]/mL — ABNORMAL HIGH (ref 2.6–18.5)

## 2013-09-19 LAB — VITAMIN B12: Vitamin B-12: 1078 pg/mL — ABNORMAL HIGH (ref 211–911)

## 2013-09-23 ENCOUNTER — Encounter (HOSPITAL_COMMUNITY): Payer: Self-pay | Admitting: Dentistry

## 2013-09-23 ENCOUNTER — Encounter: Payer: Self-pay | Admitting: Hematology and Oncology

## 2013-09-23 NOTE — Progress Notes (Signed)
Called and left message for call back on vmail for patient.

## 2013-09-26 ENCOUNTER — Telehealth: Payer: Self-pay | Admitting: *Deleted

## 2013-09-26 ENCOUNTER — Encounter: Payer: Self-pay | Admitting: Hematology and Oncology

## 2013-09-26 NOTE — Progress Notes (Signed)
Called and the patient said he has already talked to the dr.

## 2013-09-26 NOTE — Telephone Encounter (Signed)
I spoke with the patient over the phone to discuss recommendation from recent case review at the hematology tumor board. The patient has persistent low grade myelodysplastic syndrome. Serum EPO level is greater than 500. The patient would not qualify for erythropoietin stimulating agents based on current guidelines. I recommend continue CBC monitoring on a monthly basis. He will be seen in August as scheduled. The patient expressed understanding.

## 2013-09-26 NOTE — Telephone Encounter (Signed)
Pt states he thinks he missed a call from Dr. Alvy Bimler and he is returning her call.

## 2013-10-08 ENCOUNTER — Ambulatory Visit: Payer: Self-pay

## 2013-10-08 ENCOUNTER — Telehealth: Payer: Self-pay | Admitting: *Deleted

## 2013-10-08 ENCOUNTER — Other Ambulatory Visit (HOSPITAL_BASED_OUTPATIENT_CLINIC_OR_DEPARTMENT_OTHER): Payer: BC Managed Care – PPO

## 2013-10-08 DIAGNOSIS — D4620 Refractory anemia with excess of blasts, unspecified: Secondary | ICD-10-CM

## 2013-10-08 DIAGNOSIS — D649 Anemia, unspecified: Secondary | ICD-10-CM

## 2013-10-08 LAB — CBC & DIFF AND RETIC
BASO%: 0.8 % (ref 0.0–2.0)
Basophils Absolute: 0 10*3/uL (ref 0.0–0.1)
EOS%: 1.1 % (ref 0.0–7.0)
Eosinophils Absolute: 0 10*3/uL (ref 0.0–0.5)
HEMATOCRIT: 28.6 % — AB (ref 38.4–49.9)
HGB: 9.7 g/dL — ABNORMAL LOW (ref 13.0–17.1)
Immature Retic Fract: 4.7 % (ref 3.00–10.60)
LYMPH%: 20.7 % (ref 14.0–49.0)
MCH: 39.6 pg — ABNORMAL HIGH (ref 27.2–33.4)
MCHC: 33.9 g/dL (ref 32.0–36.0)
MCV: 116.7 fL — ABNORMAL HIGH (ref 79.3–98.0)
MONO#: 0.5 10*3/uL (ref 0.1–0.9)
MONO%: 13.6 % (ref 0.0–14.0)
NEUT%: 63.8 % (ref 39.0–75.0)
NEUTROS ABS: 2.3 10*3/uL (ref 1.5–6.5)
PLATELETS: 248 10*3/uL (ref 140–400)
RBC: 2.45 10*6/uL — ABNORMAL LOW (ref 4.20–5.82)
RDW: 17.3 % — ABNORMAL HIGH (ref 11.0–14.6)
Retic %: 0.92 % (ref 0.80–1.80)
Retic Ct Abs: 22.54 10*3/uL — ABNORMAL LOW (ref 34.80–93.90)
WBC: 3.5 10*3/uL — ABNORMAL LOW (ref 4.0–10.3)
lymph#: 0.7 10*3/uL — ABNORMAL LOW (ref 0.9–3.3)

## 2013-10-08 NOTE — Telephone Encounter (Signed)
Called pt at home and left message for pt to call nurse back about lab results done today.

## 2013-10-09 ENCOUNTER — Telehealth: Payer: Self-pay | Admitting: *Deleted

## 2013-10-09 NOTE — Telephone Encounter (Signed)
Informed pt of CBC results look ok per Dr. Alvy Bimler.  Keep next lab appt as scheduled later this month.  He verbalized understanding.

## 2013-10-10 ENCOUNTER — Encounter: Payer: Self-pay | Admitting: Hematology and Oncology

## 2013-10-24 ENCOUNTER — Encounter: Payer: Self-pay | Admitting: Radiation Oncology

## 2013-10-24 ENCOUNTER — Ambulatory Visit
Admission: RE | Admit: 2013-10-24 | Discharge: 2013-10-24 | Disposition: A | Discharge status: Home / Self Care | Payer: BC Managed Care – PPO | Source: Ambulatory Visit | Attending: Radiation Oncology | Admitting: Radiation Oncology

## 2013-10-24 ENCOUNTER — Encounter: Payer: Self-pay | Admitting: *Deleted

## 2013-10-24 VITALS — BP 132/75 | HR 91 | Temp 98.2°F | Resp 24 | Ht 72.0 in | Wt 119.8 lb

## 2013-10-24 DIAGNOSIS — Y842 Radiological procedure and radiotherapy as the cause of abnormal reaction of the patient, or of later complication, without mention of misadventure at the time of the procedure: Secondary | ICD-10-CM | POA: Insufficient documentation

## 2013-10-24 DIAGNOSIS — D469 Myelodysplastic syndrome, unspecified: Secondary | ICD-10-CM | POA: Insufficient documentation

## 2013-10-24 DIAGNOSIS — R05 Cough: Secondary | ICD-10-CM | POA: Insufficient documentation

## 2013-10-24 DIAGNOSIS — R059 Cough, unspecified: Secondary | ICD-10-CM | POA: Insufficient documentation

## 2013-10-24 DIAGNOSIS — D649 Anemia, unspecified: Secondary | ICD-10-CM | POA: Insufficient documentation

## 2013-10-24 DIAGNOSIS — T66XXXA Radiation sickness, unspecified, initial encounter: Secondary | ICD-10-CM | POA: Insufficient documentation

## 2013-10-24 DIAGNOSIS — C321 Malignant neoplasm of supraglottis: Secondary | ICD-10-CM | POA: Insufficient documentation

## 2013-10-24 DIAGNOSIS — Z79899 Other long term (current) drug therapy: Secondary | ICD-10-CM | POA: Insufficient documentation

## 2013-10-24 MED ORDER — LARYNGOSCOPY SOLUTION RAD-ONC
15.0000 mL | Freq: Once | TOPICAL | Status: AC
  Administered 2013-10-24: 15 mL via TOPICAL
  Filled 2013-10-24: qty 15

## 2013-10-24 NOTE — Progress Notes (Signed)
Radiation Oncology         [phone]) [phone] ________________________________  Name: Adrian [name] MRN: [id number]  Date: 10/24/2013  DOB: 1951-02-11  Follow-Up Visit Note  CC: [name], MD  [name], MD  Diagnosis:   Squamous cell carcinoma of the epiglottis  Primary site: Larynx - Supraglottis (Left), Clinical: Stage I (T1, N0, M0)    Interval Since Last Radiation:  4  months  Narrative:  The patient returns today for routine follow-up.  He is doing well at this time. He reports a good appetite with no pain with swallowing or difficulty with swallowing. He denies any breathing problems or wheeze issues. He does have some mild fatigue. He continues to use a cane with walking. Patient continues to followup with medical oncology  concerning his myelodysplastic syndrome with anemia.  He has had removal of his gastrostomy feeding tube.                         ALLERGIES:  has No Known Allergies.  Meds: Current Outpatient Prescriptions  Medication Sig Dispense Refill  . cyanocobalamin 500 MCG tablet Take 500 mcg by mouth daily.      . diazepam (VALIUM) 5 MG tablet Take 10 mg by mouth every 6 (six) hours as needed for anxiety.       . ferrous sulfate 325 (65 FE) MG tablet Take 325 mg by mouth daily with breakfast.      . folic acid (FOLVITE) 1 MG tablet Take 1 mg by mouth daily.      . mometasone (NASONEX) 50 MCG/ACT nasal spray Place 2 sprays into both nostrils daily.      . phenytoin (DILANTIN) 200 MG ER capsule Take 200 mg by mouth 2 (two) times daily.       . vitamin E 1000 UNIT capsule Take 1,000 Units by mouth daily.        No current facility-administered medications for this encounter.    Physical Findings: The patient is in no acute distress. Patient is alert and oriented.  height is 6' (1.829 m) and weight is 119 lb 12.8 oz (54.341 kg). His oral temperature is 98.2 F (36.8 C). His blood pressure is 132/75 and his pulse is 91. His respiration is 24 and oxygen  saturation is 98%. .  No palpable cervical supraclavicular or axillary adenopathy. The lungs clear to auscultation. The heart has regular rhythm and rate. The abdomen is soft and nontender with normal bowel sounds. The oral cavity reveals the patient to be edentulous. His dentures were removed for examination. No mucosal lesions noted. Patient would not tolerate a mirror examination study proceeded to undergo  Nasopharyngoscopy: Topical anesthetic and decongestant was placed in the nasal cavities. Patient then proceeded to undergo fiberoptic examination through his right nasal cavity. The vocal cords moved well. No obvious mucosal lesions however the patient began coughing during the procedure limiting a good view of the epiglottis region.  Lab Findings: Lab Results  Component Value Date   WBC 3.5* 10/08/2013   HGB 9.7* 10/08/2013   HCT 28.6* 10/08/2013   MCV 116.7* 10/08/2013   PLT 248 10/08/2013      Radiographic Findings: No results found.  Impression:  The patient is recovering from the effects of radiation.  No obvious signs of recurrence on fiberoptic exam but exam suboptimal today in light of his coughing episode.  Plan:  I have recommended followup with ENT in 6 weeks. Routine followup in radiation  oncology in 3 months.  ____________________________________ Blair Promise, MD

## 2013-10-24 NOTE — Addendum Note (Signed)
Encounter addended by: Claybon Jabs, [place] on: 10/24/2013  2:58 PM<BR>     Documentation filed: Rx Order Verification

## 2013-10-24 NOTE — Progress Notes (Addendum)
Adrian [name] here for follow up after treatment to his larynx-supraglottis.  He denies pain, sore throat and dry mouth.  He reports being hoarse.  He reports a good appetite and is able to eat solid and liquids without problems.  He has lost 3 lbs since 09/17/13.  He denies dizziness with standing.  Orthostatic vitals done: bp sitting 132/75, hr 91, bp standing 113/77, hr 91.   No signs of thrush noted in his mouth.  He reports fatigue and shortness of breath with walking distances.  He denies a cough.  He is using a cane today.  His skin is intact with an area of scarring on his central neck.

## 2013-10-24 NOTE — Addendum Note (Signed)
Encounter addended by: Jacqulyn Liner, RN on: 10/24/2013  3:25 PM<BR>     Documentation filed: Inpatient MAR

## 2013-10-24 NOTE — Addendum Note (Signed)
Encounter addended by: Jacqulyn Liner, RN on: 10/24/2013  2:58 PM<BR>     Documentation filed: Charges VN, Orders

## 2013-10-24 NOTE — Progress Notes (Signed)
To provide support, encouragement and care continuity, met with patient and his wife during 47M follow-up with Dr. Earney Hamburg.  Patient did not express any needs or concerns.  Set up laryngscope for patient evaluation.  Called Dr. Janeice Robinson office, patient's ENT, to prompt scheduling of appt in 6 weeks per Dr. Clabe Seal directive to patient; scheduler Gerald Stabs stated she would call patient. Continuing to navigate as L3 (treatments completed) patient.  Gayleen Orem, RN, BSN, [facility] Oncology Navigator [phone]

## 2013-10-29 ENCOUNTER — Ambulatory Visit: Payer: Self-pay

## 2013-10-29 ENCOUNTER — Other Ambulatory Visit (HOSPITAL_BASED_OUTPATIENT_CLINIC_OR_DEPARTMENT_OTHER): Payer: BC Managed Care – PPO

## 2013-10-29 DIAGNOSIS — D4620 Refractory anemia with excess of blasts, unspecified: Secondary | ICD-10-CM

## 2013-10-29 LAB — CBC & DIFF AND RETIC
BASO%: 0.7 % (ref 0.0–2.0)
Basophils Absolute: 0 10*3/uL (ref 0.0–0.1)
EOS ABS: 0.1 10*3/uL (ref 0.0–0.5)
EOS%: 1.7 % (ref 0.0–7.0)
HCT: 27.2 % — ABNORMAL LOW (ref 38.4–49.9)
HEMOGLOBIN: 9.3 g/dL — AB (ref 13.0–17.1)
Immature Retic Fract: 9.1 % (ref 3.00–10.60)
LYMPH%: 31 % (ref 14.0–49.0)
MCH: 39.2 pg — ABNORMAL HIGH (ref 27.2–33.4)
MCHC: 34.2 g/dL (ref 32.0–36.0)
MCV: 114.8 fL — ABNORMAL HIGH (ref 79.3–98.0)
MONO#: 0.4 10*3/uL (ref 0.1–0.9)
MONO%: 13.9 % (ref 0.0–14.0)
NEUT%: 52.7 % (ref 39.0–75.0)
NEUTROS ABS: 1.6 10*3/uL (ref 1.5–6.5)
PLATELETS: 204 10*3/uL (ref 140–400)
RBC: 2.37 10*6/uL — ABNORMAL LOW (ref 4.20–5.82)
RDW: 16.1 % — ABNORMAL HIGH (ref 11.0–14.6)
Retic %: 0.45 % — ABNORMAL LOW (ref 0.80–1.80)
Retic Ct Abs: 10.67 10*3/uL — ABNORMAL LOW (ref 34.80–93.90)
WBC: 3 10*3/uL — ABNORMAL LOW (ref 4.0–10.3)
lymph#: 0.9 10*3/uL (ref 0.9–3.3)

## 2013-11-19 ENCOUNTER — Other Ambulatory Visit (HOSPITAL_BASED_OUTPATIENT_CLINIC_OR_DEPARTMENT_OTHER): Payer: BC Managed Care – PPO

## 2013-11-19 ENCOUNTER — Ambulatory Visit: Payer: Self-pay

## 2013-11-19 DIAGNOSIS — D649 Anemia, unspecified: Secondary | ICD-10-CM

## 2013-11-19 DIAGNOSIS — C321 Malignant neoplasm of supraglottis: Secondary | ICD-10-CM

## 2013-11-19 DIAGNOSIS — D4620 Refractory anemia with excess of blasts, unspecified: Secondary | ICD-10-CM

## 2013-11-19 LAB — CBC & DIFF AND RETIC
BASO%: 0.6 % (ref 0.0–2.0)
Basophils Absolute: 0 10*3/uL (ref 0.0–0.1)
EOS%: 1.1 % (ref 0.0–7.0)
Eosinophils Absolute: 0 10*3/uL (ref 0.0–0.5)
HEMATOCRIT: 28.5 % — AB (ref 38.4–49.9)
HGB: 9.6 g/dL — ABNORMAL LOW (ref 13.0–17.1)
IMMATURE RETIC FRACT: 1.5 % — AB (ref 3.00–10.60)
LYMPH#: 0.7 10*3/uL — AB (ref 0.9–3.3)
LYMPH%: 18.9 % (ref 14.0–49.0)
MCH: 38.4 pg — ABNORMAL HIGH (ref 27.2–33.4)
MCHC: 33.7 g/dL (ref 32.0–36.0)
MCV: 114 fL — ABNORMAL HIGH (ref 79.3–98.0)
MONO#: 0.4 10*3/uL (ref 0.1–0.9)
MONO%: 11.1 % (ref 0.0–14.0)
NEUT%: 68.3 % (ref 39.0–75.0)
NEUTROS ABS: 2.5 10*3/uL (ref 1.5–6.5)
PLATELETS: 193 10*3/uL (ref 140–400)
RBC: 2.5 10*6/uL — AB (ref 4.20–5.82)
RDW: 15.8 % — ABNORMAL HIGH (ref 11.0–14.6)
RETIC %: 0.58 % — AB (ref 0.80–1.80)
Retic Ct Abs: 14.5 10*3/uL — ABNORMAL LOW (ref 34.80–93.90)
WBC: 3.6 10*3/uL — AB (ref 4.0–10.3)

## 2013-12-10 ENCOUNTER — Telehealth: Payer: Self-pay | Admitting: Hematology and Oncology

## 2013-12-10 ENCOUNTER — Ambulatory Visit: Payer: Self-pay

## 2013-12-10 ENCOUNTER — Ambulatory Visit (HOSPITAL_BASED_OUTPATIENT_CLINIC_OR_DEPARTMENT_OTHER): Payer: BC Managed Care – PPO | Admitting: Hematology and Oncology

## 2013-12-10 ENCOUNTER — Encounter: Payer: Self-pay | Admitting: Hematology and Oncology

## 2013-12-10 ENCOUNTER — Other Ambulatory Visit (HOSPITAL_BASED_OUTPATIENT_CLINIC_OR_DEPARTMENT_OTHER): Payer: BC Managed Care – PPO

## 2013-12-10 VITALS — BP 136/69 | HR 86 | Temp 98.2°F | Resp 18 | Ht 72.0 in | Wt 120.6 lb

## 2013-12-10 DIAGNOSIS — C321 Malignant neoplasm of supraglottis: Secondary | ICD-10-CM

## 2013-12-10 DIAGNOSIS — G40909 Epilepsy, unspecified, not intractable, without status epilepticus: Secondary | ICD-10-CM

## 2013-12-10 DIAGNOSIS — D649 Anemia, unspecified: Secondary | ICD-10-CM

## 2013-12-10 DIAGNOSIS — D4620 Refractory anemia with excess of blasts, unspecified: Secondary | ICD-10-CM

## 2013-12-10 LAB — CBC & DIFF AND RETIC
BASO%: 0.5 % (ref 0.0–2.0)
Basophils Absolute: 0 10*3/uL (ref 0.0–0.1)
EOS ABS: 0.1 10*3/uL (ref 0.0–0.5)
EOS%: 1.2 % (ref 0.0–7.0)
HCT: 27 % — ABNORMAL LOW (ref 38.4–49.9)
HGB: 9 g/dL — ABNORMAL LOW (ref 13.0–17.1)
IMMATURE RETIC FRACT: 7.4 % (ref 3.00–10.60)
LYMPH#: 1.1 10*3/uL (ref 0.9–3.3)
LYMPH%: 26.8 % (ref 14.0–49.0)
MCH: 37.7 pg — ABNORMAL HIGH (ref 27.2–33.4)
MCHC: 33.3 g/dL (ref 32.0–36.0)
MCV: 113 fL — ABNORMAL HIGH (ref 79.3–98.0)
MONO#: 0.5 10*3/uL (ref 0.1–0.9)
MONO%: 12.1 % (ref 0.0–14.0)
NEUT%: 59.4 % (ref 39.0–75.0)
NEUTROS ABS: 2.5 10*3/uL (ref 1.5–6.5)
PLATELETS: 225 10*3/uL (ref 140–400)
RBC: 2.39 10*6/uL — AB (ref 4.20–5.82)
RDW: 16.5 % — AB (ref 11.0–14.6)
RETIC %: 0.95 % (ref 0.80–1.80)
RETIC CT ABS: 22.71 10*3/uL — AB (ref 34.80–93.90)
WBC: 4.1 10*3/uL (ref 4.0–10.3)
nRBC: 0 % (ref 0–0)

## 2013-12-10 NOTE — Assessment & Plan Note (Signed)
The anemia from MDS is stable. I suspect he may have some degree of bone marrow suppression from his antiseizure medications. I recommend observation from the anemia standpoint but I would like to refer him to a neurologist to consider taper off his seizure medications or changing into another kind with minimal effect on the bone marrow.

## 2013-12-10 NOTE — Assessment & Plan Note (Signed)
Clinically, he has no signs of recurrence. He has ENT consult pending.

## 2013-12-10 NOTE — Assessment & Plan Note (Signed)
He denies recurrence of seizures in the past 8 years. Recommend neurology consultation to consider tapering off his antiseizure medications or changing to a different kind with least effect of bone marrow suppression.

## 2013-12-10 NOTE — Telephone Encounter (Signed)
gv and printed appt sched and avs for pt fro Sept and Dec...Marland KitchenMarland Kitchenlvm for Dian at [facility] to sched pt for appt.Marland Kitchen..i will call pt with appt d.t

## 2013-12-10 NOTE — Progress Notes (Signed)
[place] OFFICE PROGRESS NOTE  Patient Care Team: [name], MD as PCP - General (Family Medicine) [name], MD as Consulting Physician (Hematology and Oncology) Brooks Sailors, RN as Registered Nurse (Oncology)  SUMMARY OF ONCOLOGIC HISTORY: Oncology History   MDS, R-IPSS score of 3, low risk (Hg 8.9, +16 chromosome on BM, 2% blast count)   Squamous cell carcinoma of the epiglottis   Primary site: Larynx - Supraglottis (Left)   Staging method: AJCC 7th Edition   Clinical: Stage I (T1, N0, M0) signed by [name], MD on 04/30/2013 12:49 PM   Pathologic: Stage I (T1, N0, cM0) signed by [name], MD on 04/30/2013 12:49 PM   Summary: Stage I (T1, N0, cM0)       MDS (myelodysplastic syndrome), low grade   02/01/2007 Bone Marrow Biopsy BM biopsy was abnormal, overall probable low grade MDS   03/23/2011 - 02/06/2013 Chemotherapy [name], discontinued due to diagnosis of laryngeal ca   09/06/2013 Bone Marrow Biopsy Repeat bone marrow aspirate and biopsy confirmed low-grade myelodysplastic syndrome.   09/17/2013 -  Chemotherapy The patient resumed darbepoetin injections to treat the anemia.    Squamous cell carcinoma of the epiglottis   03/06/2013 Procedure Biopsy from epiglottic region showed invasive [facility]   04/25/2013 Imaging Ct scan showed no other involvement in the LN   05/13/2013 - 06/28/2013 Radiation Therapy He received radiation therapy, 70 gray in 35 fractions to the larynx    INTERVAL HISTORY: Please see below for problem oriented charting. He returns today to follow on his diagnosis of myelodysplastic syndrome and prior diagnosis of cancer. Overall, he has excellent energy levels. Denies recent infection. Denies recent seizure. He denies any change in his voice. No new lumps in his neck.  REVIEW OF SYSTEMS:   Constitutional: Denies fevers, chills or abnormal weight loss Eyes: Denies blurriness of vision Ears, nose, mouth, throat,  and face: Denies mucositis or sore throat Respiratory: Denies cough, dyspnea or wheezes Cardiovascular: Denies palpitation, chest discomfort or lower extremity swelling Gastrointestinal:  Denies nausea, heartburn or change in bowel habits Skin: Denies abnormal skin rashes Lymphatics: Denies new lymphadenopathy or easy bruising Neurological:Denies numbness, tingling or new weaknesses Behavioral/Psych: Mood is stable, no new changes  All other systems were reviewed with the patient and are negative.  I have reviewed the past medical history, past surgical history, social history and family history with the patient and they are unchanged from previous note.  ALLERGIES:  has No Known Allergies.  MEDICATIONS:  Current Outpatient Prescriptions  Medication Sig Dispense Refill  . cyanocobalamin 500 MCG tablet Take 500 mcg by mouth daily.      . diazepam (VALIUM) 5 MG tablet Take 10 mg by mouth every 6 (six) hours as needed for anxiety.       . ferrous sulfate 325 (65 FE) MG tablet Take 325 mg by mouth daily with breakfast.      . folic acid (FOLVITE) 1 MG tablet Take 1 mg by mouth daily.      . mometasone (NASONEX) 50 MCG/ACT nasal spray Place 2 sprays into both nostrils daily.      . phenytoin (DILANTIN) 200 MG ER capsule Take 200 mg by mouth 2 (two) times daily.       . vitamin E 1000 UNIT capsule Take 1,000 Units by mouth daily.        No current facility-administered medications for this visit.    PHYSICAL EXAMINATION: ECOG PERFORMANCE STATUS: 0 -  Asymptomatic  Filed Vitals:   12/10/13 1225  BP: 136/69  Pulse: 86  Temp: 98.2 F (36.8 C)  Resp: 18   Filed Weights   12/10/13 1225  Weight: 120 lb 9.6 oz (54.704 kg)    GENERAL:alert, no distress and comfortable. He looks thin and cachectic SKIN: skin color, texture, turgor are normal, no rashes or significant lesions EYES: normal, Conjunctiva are pink and non-injected, sclera clear OROPHARYNX:no exudate, no erythema and lips,  buccal mucosa, and tongue normal  NECK: supple, thyroid normal size, non-tender, without nodularity LYMPH:  no palpable lymphadenopathy in the cervical, axillary or inguinal LUNGS: clear to auscultation and percussion with normal breathing effort HEART: regular rate & rhythm and no murmurs and no lower extremity edema ABDOMEN:abdomen soft, non-tender and normal bowel sounds Musculoskeletal:no cyanosis of digits and no clubbing  NEURO: alert & oriented x 3 with fluent speech, no focal motor/sensory deficits  LABORATORY DATA:  I have reviewed the data as listed    Component Value Date/Time   NA 139 09/17/2013 1158   NA 139 07/07/2013 0545   K 4.1 09/17/2013 1158   K 4.0 07/07/2013 0545   CL 101 07/07/2013 0545   CL 107 09/19/2012 1325   CO2 20* 09/17/2013 1158   CO2 25 07/07/2013 0545   GLUCOSE 77 09/17/2013 1158   GLUCOSE 81 07/07/2013 0545   GLUCOSE 97 09/19/2012 1325   BUN 17.4 09/17/2013 1158   BUN 25* 07/07/2013 0545   CREATININE 0.9 09/17/2013 1158   CREATININE 0.67 07/07/2013 0545   CALCIUM 9.3 09/17/2013 1158   CALCIUM 8.5 07/07/2013 0545   PROT 8.5* 09/17/2013 1158   PROT 7.2 07/07/2013 0545   ALBUMIN 3.5 09/17/2013 1158   ALBUMIN 2.4* 07/07/2013 0545   AST 10 09/17/2013 1158   AST 19 07/07/2013 0545   ALT <6 09/17/2013 1158   ALT 14 07/07/2013 0545   ALKPHOS 151* 09/17/2013 1158   ALKPHOS 121* 07/07/2013 0545   BILITOT 0.23 09/17/2013 1158   BILITOT 0.2* 07/07/2013 0545   GFRNONAA >90 07/07/2013 0545   GFRAA >90 07/07/2013 0545    No results found for this basename: SPEP, UPEP,  kappa and lambda light chains    Lab Results  Component Value Date   WBC 4.1 12/10/2013   NEUTROABS 2.5 12/10/2013   HGB 9.0* 12/10/2013   HCT 27.0* 12/10/2013   MCV 113.0* 12/10/2013   PLT 225 12/10/2013      Chemistry      Component Value Date/Time   NA 139 09/17/2013 1158   NA 139 07/07/2013 0545   K 4.1 09/17/2013 1158   K 4.0 07/07/2013 0545   CL 101 07/07/2013 0545   CL 107 09/19/2012 1325   CO2 20* 09/17/2013 1158   CO2 25  07/07/2013 0545   BUN 17.4 09/17/2013 1158   BUN 25* 07/07/2013 0545   CREATININE 0.9 09/17/2013 1158   CREATININE 0.67 07/07/2013 0545      Component Value Date/Time   CALCIUM 9.3 09/17/2013 1158   CALCIUM 8.5 07/07/2013 0545   ALKPHOS 151* 09/17/2013 1158   ALKPHOS 121* 07/07/2013 0545   AST 10 09/17/2013 1158   AST 19 07/07/2013 0545   ALT <6 09/17/2013 1158   ALT 14 07/07/2013 0545   BILITOT 0.23 09/17/2013 1158   BILITOT 0.2* 07/07/2013 0545      ASSESSMENT & PLAN:  Squamous cell carcinoma of the epiglottis Clinically, he has no signs of recurrence. He has ENT consult pending.  MDS (myelodysplastic  syndrome), low grade The anemia from MDS is stable. I suspect he may have some degree of bone marrow suppression from his antiseizure medications. I recommend observation from the anemia standpoint but I would like to refer him to a neurologist to consider taper off his seizure medications or changing into another kind with minimal effect on the bone marrow.  Seizure disorder He denies recurrence of seizures in the past 8 years. Recommend neurology consultation to consider tapering off his antiseizure medications or changing to a different kind with least effect of bone marrow suppression.   Orders Placed This Encounter  Procedures  . Ambulatory referral to Neurology    Referral Priority:  Routine    Referral Type:  Consultation    Referral Reason:  Specialty Services Required    Requested Specialty:  Neurology    Number of Visits Requested:  1   All questions were answered. The patient knows to call the clinic with any problems, questions or concerns. No barriers to learning was detected. I spent 15 minutes counseling the patient face to face. The total time spent in the appointment was 20 minutes and more than 50% was on counseling and review of test results     [facility], [place], MD 12/10/2013 8:39 PM

## 2013-12-25 ENCOUNTER — Ambulatory Visit (INDEPENDENT_AMBULATORY_CARE_PROVIDER_SITE_OTHER): Payer: Medicare Other | Admitting: Neurology

## 2013-12-25 ENCOUNTER — Encounter: Payer: Self-pay | Admitting: Neurology

## 2013-12-25 ENCOUNTER — Other Ambulatory Visit (INDEPENDENT_AMBULATORY_CARE_PROVIDER_SITE_OTHER): Payer: Medicare Other

## 2013-12-25 ENCOUNTER — Ambulatory Visit: Payer: Self-pay | Admitting: Neurology

## 2013-12-25 VITALS — BP 118/66 | HR 78 | Temp 98.1°F | Ht 71.0 in | Wt 121.0 lb

## 2013-12-25 DIAGNOSIS — R413 Other amnesia: Secondary | ICD-10-CM | POA: Insufficient documentation

## 2013-12-25 DIAGNOSIS — G3184 Mild cognitive impairment, so stated: Secondary | ICD-10-CM

## 2013-12-25 NOTE — Patient Instructions (Signed)
I had a long discussion with the patient and his family regarding his remote history of seizures, risk for seizure recurrence with tapering Dilantin as well as his memory difficulties and mild cognitive impairment and discuss plan for further evaluation and treatment and answered questions. I clearly explained that he may have a slight risk of seizure recurrence but since he has been seizure-free for 8 years it may be worth that risk. Check EEG, MRI scan, vitamin B12, TSH and RPR. Reduce Dilantin to 300 mg daily for 2 months then 200 mg daily for 2 months then 100 mg daily  For 2 months and discontinue. Return for followup in 2 months or call earlier if necessary. Mild Neurocognitive Disorder Mild neurocognitive disorder (formerly known as mild cognitive impairment) is a mental disorder. It is a slight abnormal decrease in mental function. The areas of mental function affected may include memory, thought, communication, behavior, and completion of tasks. The decrease is noticeable and measurable but for the most part does not interfere with your daily activities. Mild neurocognitive disorder typically occurs in people older than 60 years but can occur earlier. It is not as serious as major neurocognitive disorder (formerly known as dementia) but may lead to a more serious neurocognitive disorder. However, in some cases the condition does not get worse. A few people with this disorder even improve. CAUSES  There are a number of different causes of mild neurocognitive disorder:   Brain disorders associated with abnormal protein deposits, such as Alzheimer's disease, Pick's disease, and Lewy body disease.  Brain disorders associated with abnormal movement, such as Parkinson's disease and Huntington's disease.  Diseases affecting blood vessels in the brain and resulting in mini-strokes.  Certain infections, such as human immunodeficiency virus (HIV) infection.  Traumatic brain injury.  Other medical  conditions such as brain tumors, underactive thyroid (hypothyroidism), and vitamin B12 deficiency.  Use of certain prescription medicine and "recreational" drugs. SYMPTOMS  Symptoms of mild neurocognitive disorder include:  Difficulty remembering. You may forget details of recent events, names, or phone numbers. You may forget important social events and appointments or repeatedly forget where you put your car keys.  Difficulty thinking and solving problems. You may have trouble with complex tasks such as paying bills or driving in unfamiliar locations.  Difficulty communicating. You may have trouble finding the right word, naming an object, forming a sentence that makes sense, or understanding what you read or hear.  Changes in your behavior or personality. You may lose interest in the things that you used to enjoy or withdraw from social situations. You may get angry more easily than usual. You may act before thinking. You may do things in public that you would not usually do. You may hear or see things that are not real (hallucinations). You may believe falsely that others are trying to hurt you (paranoia). DIAGNOSIS Mild neurocognitive disorder is diagnosed through an assessment by your health care provider. Your health care provider will ask you and your family, friends, or coworkers questions about your symptoms. He or she will ask how often the symptoms occur, how long they have been occurring, whether they are getting worse, and the effect they are having on your life. Your health care provider may refer you to a neurologist or mental health specialist for a detailed evaluation of your mental functions (neuropsychological testing).  To identify the cause of your mild neurocognitive disorder, your health care provider may:  Obtain a detailed medical history.  Ask about alcohol  and drug use, including prescription medicine.  Perform a physical exam.  Order blood tests and brain imaging  exams. TREATMENT  Mild neurocognitive disorder caused by infections, use of certain medicines or "recreational" drugs, and certain medical conditions may improve with treatment of the condition that is causing the disorder. Mild neurocognitive disorder resulting from other causes generally does not improve and may worsen. In these cases, the goal of treatment is to slow progression of the disorder and help you cope with the loss of mental function. Treatments in these cases include:   Medicine. Medicine helps mainly with memory loss and behavioral symptoms.   Talk therapy. Talk therapy provides education, emotional support, memory aids, and other ways of making up for decreases in mental function.   Lifestyle changes. These include regular exercise, a healthy diet (including essential omega-3 fatty acids), intellectual stimulation, and increased social interaction. Document Released: 12/26/2012 Document Revised: 09/09/2013 Document Reviewed: 12/26/2012 [facility] Patient Information 2015 [place], [place]. This information is not intended to replace advice given to you by your health care provider. Make sure you discuss any questions you have with your health care provider.

## 2013-12-26 LAB — TSH: TSH: 1.02 u[IU]/mL (ref 0.450–4.500)

## 2013-12-26 LAB — VITAMIN B12: VITAMIN B 12: 1364 pg/mL — AB (ref 211–946)

## 2013-12-26 LAB — RPR: RPR: NONREACTIVE

## 2013-12-26 NOTE — Progress Notes (Signed)
[facility] [address] [place]. [place] [address] [phone]       OFFICE CONSULT NOTE  [name] Date of Birth:  22-Sep-1950 Medical Record Number:  [id number]   Referring MD:  [name]  Reason for Referral:  seizure  HPI: 63 year male with history of seizures since 30 years which were apparently called alcohol related either due to intoxication or withdrawal. He has been on Dilantin since a long time and he could tell of previous stroke. He has myelodysplastic syndrome with anemia. It is suspected that chronic Dilantin usage may have contributed to his bone marrow suppression fan primary care physician has raised the question as to whether he can come off anticonvulsants since he has been seizure-free for a long time. He denies history of head injury, loss of consciousness, febrile illness for unprovoked seizures. He does not remember any unprovoked seizures which was nonl alcohol related. There is no family history of seizures or epilepsy. ROS:   14 system review of systems is positive for shortness of breath, seizure, anxiety, depression and all of the systems negative  PMH:  Past Medical History  Diagnosis Date  . Megaloblastic anemia   . Emphysema of lung   . Arthritis     rheumatoid  . Esophageal reflux   . Anxiety   . Shortness of breath     with exertion  . Pneumonia 01/2013  . Seizures     last one was 8 years ago  . History of blood transfusion   . Cancer 03/11/2013    larnyx/epiglottic Squamous cell in situ  . Throat pain 06/13/2013  . History of radiation therapy 05/13/2013-06/28/2013    70 gray to epiglottis/neck    Social History:  History   Social History  . Marital Status: Married    Spouse Name: Hassan Rowan    Number of Children: 3  . Years of Education: 12th   Occupational History  . Retired Other   Social History Main Topics  . Smoking status: Former Smoker -- 1.00 packs/day for 40 years    Types: Cigarettes    Quit  date: 04/15/2013  . Smokeless tobacco: Never Used     Comment: patient states he is slowly quitting on his own - does not want assistance  . Alcohol Use: No     Comment: once every three months/ h/o etoh abuse  . Drug Use: No  . Sexual Activity: Not on file   Other Topics Concern  . Not on file   Social History Narrative   04/12/2013   The patient is married with 3 children. (2 girls and one boy)    The patient has a history of smoking a quarter pack per day for 15 years.   The patient is trying to quit on his own.   Patient has not had any alcohol for the past 6 months by report. Patient usually drinks vodka.   Caffeine Use: 2 cups daily             Medications:   Current Outpatient Prescriptions on File Prior to Visit  Medication Sig Dispense Refill  . cyanocobalamin 500 MCG tablet Take 500 mcg by mouth daily.      . diazepam (VALIUM) 5 MG tablet Take 10 mg by mouth every 6 (six) hours as needed for anxiety.       . ferrous sulfate 325 (65 FE) MG tablet Take 325 mg by mouth daily with breakfast.      .  folic acid (FOLVITE) 1 MG tablet Take 1 mg by mouth daily.      . mometasone (NASONEX) 50 MCG/ACT nasal spray Place 2 sprays into both nostrils daily.      . phenytoin (DILANTIN) 200 MG ER capsule Take 200 mg by mouth 2 (two) times daily.       . vitamin E 1000 UNIT capsule Take 1,000 Units by mouth daily.        No current facility-administered medications on file prior to visit.    Allergies:  No Known Allergies  Physical Exam General: well developed, well nourished, seated, in no evident distress Head: head normocephalic and atraumatic. Orohparynx benign Neck: supple with no carotid or supraclavicular bruits Cardiovascular: regular rate and rhythm, no murmurs Musculoskeletal: no deformity Skin:  no rash/petichiae. Chronic fungus infection left hand fingernails Vascular:  Normal pulses all extremities Filed Vitals:   12/25/13 0905  BP: 118/66  Pulse: 78  Temp: 98.1  F (36.7 C)    Neurologic Exam Mental Status: Awake and fully alert. Oriented to place and time. Recent and remote memory intact. Attention span, concentration and fund of knowledge appropriate. Mood and affect appropriate. Diminished recall 0/3 animals in a 9. Cranial Nerves: Fundoscopic exam reveals sharp disc margins. Pupils equal, briskly reactive to light. Extraocular movements full without nystagmus. Visual fields full to confrontation. Hearing intact. Facial sensation intact. Face, tongue, palate moves normally and symmetrically.  Motor: Normal bulk and tone. Normal strength in all tested extremity muscles. Sensory.: intact to tough and pinprick and vibratory.  Coordination: Rapid alternating movements normal in all extremities. Finger-to-nose and heel-to-shin performed accurately bilaterally. Gait and Station: Arises from chair without difficulty. Stance is normal. Gait demonstrates normal stride length and balance . Able to heel, toe and tandem walk without difficulty.  Reflexes: 1+ and symmetric. Toes downgoing.    ASSESSMENT: 15 year remote history of seizures since 30 years has been seizure-free since 2008. Most of the seizures appear to have been alcohol related.    PLAN: I had a long discussion with the patient and his family regarding his remote history of seizures, risk for seizure recurrence with tapering Dilantin as well as his memory difficulties and mild cognitive impairment and discuss plan for further evaluation and treatment and answered questions. I clearly explained that he may have a slight risk of seizure recurrence but since he has been seizure-free for 8 years it may be worth that risk. Check EEG, MRI scan, vitamin B12, TSH and RPR. Reduce Dilantin to 300 mg daily for 2 months then 200 mg daily for 2 months then 100 mg daily  For 2 months and discontinue. Return for followup in 2 months or call earlier if necessary. M  Note: This document was prepared with digital  dictation and possible smart phrase technology. Any transcriptional errors that result from this process are unintentional.

## 2014-01-30 ENCOUNTER — Ambulatory Visit
Admission: RE | Admit: 2014-01-30 | Discharge: 2014-01-30 | Disposition: A | Discharge status: Home / Self Care | Payer: BC Managed Care – PPO | Source: Ambulatory Visit | Attending: Neurology | Admitting: Neurology

## 2014-01-30 DIAGNOSIS — R413 Other amnesia: Secondary | ICD-10-CM

## 2014-01-30 MED ORDER — GADOBENATE DIMEGLUMINE 529 MG/ML IV SOLN
11.0000 mL | Freq: Once | INTRAVENOUS | Status: AC | PRN
Start: 2014-01-30 — End: 2014-01-30
  Administered 2014-01-30: 11 mL via INTRAVENOUS

## 2014-02-04 ENCOUNTER — Ambulatory Visit
Admission: RE | Admit: 2014-02-04 | Discharge: 2014-02-04 | Disposition: A | Discharge status: Home / Self Care | Payer: BC Managed Care – PPO | Source: Ambulatory Visit | Attending: Radiation Oncology | Admitting: Radiation Oncology

## 2014-02-04 ENCOUNTER — Other Ambulatory Visit: Payer: Self-pay | Admitting: Hematology and Oncology

## 2014-02-04 ENCOUNTER — Telehealth: Payer: Self-pay | Admitting: *Deleted

## 2014-02-04 ENCOUNTER — Other Ambulatory Visit (HOSPITAL_BASED_OUTPATIENT_CLINIC_OR_DEPARTMENT_OTHER): Payer: BC Managed Care – PPO

## 2014-02-04 ENCOUNTER — Encounter: Payer: Self-pay | Admitting: Radiation Oncology

## 2014-02-04 VITALS — BP 127/61 | HR 93 | Temp 97.9°F | Resp 16 | Ht 72.0 in | Wt 122.2 lb

## 2014-02-04 DIAGNOSIS — D4620 Refractory anemia with excess of blasts, unspecified: Secondary | ICD-10-CM

## 2014-02-04 DIAGNOSIS — C321 Malignant neoplasm of supraglottis: Secondary | ICD-10-CM

## 2014-02-04 LAB — CBC & DIFF AND RETIC
BASO%: 0.5 % (ref 0.0–2.0)
BASOS ABS: 0 10*3/uL (ref 0.0–0.1)
EOS%: 1.4 % (ref 0.0–7.0)
Eosinophils Absolute: 0.1 10*3/uL (ref 0.0–0.5)
HEMATOCRIT: 24.4 % — AB (ref 38.4–49.9)
HEMOGLOBIN: 8.3 g/dL — AB (ref 13.0–17.1)
IMMATURE RETIC FRACT: 10.1 % (ref 3.00–10.60)
LYMPH#: 1.1 10*3/uL (ref 0.9–3.3)
LYMPH%: 25 % (ref 14.0–49.0)
MCH: 38.8 pg — AB (ref 27.2–33.4)
MCHC: 34 g/dL (ref 32.0–36.0)
MCV: 114 fL — ABNORMAL HIGH (ref 79.3–98.0)
MONO#: 0.4 10*3/uL (ref 0.1–0.9)
MONO%: 10 % (ref 0.0–14.0)
NEUT%: 63.1 % (ref 39.0–75.0)
NEUTROS ABS: 2.7 10*3/uL (ref 1.5–6.5)
Platelets: 193 10*3/uL (ref 140–400)
RBC: 2.14 10*6/uL — ABNORMAL LOW (ref 4.20–5.82)
RDW: 17.4 % — AB (ref 11.0–14.6)
Retic %: 0.91 % (ref 0.80–1.80)
Retic Ct Abs: 19.47 10*3/uL — ABNORMAL LOW (ref 34.80–93.90)
WBC: 4.3 10*3/uL (ref 4.0–10.3)

## 2014-02-04 NOTE — Progress Notes (Signed)
Radiation Oncology         [id number]) [id number] ________________________________  Name: Adrian [name] MRN: [id number]  Date: 02/04/2014  DOB: 04/01/1951  Follow-Up Visit Note  CC: Adrian Huh, MD  Adrian Gala, MD  Diagnosis:   Squamous cell carcinoma of the epiglottis  Primary site: Larynx - Supraglottis (Left), Clinical: Stage I (T1, N0, M0)    Interval Since Last Radiation:  7  months  Narrative:  The patient returns today for routine follow-up.  He is doing well and without complaints. He denies any cough or breathing problems. He denies any swallowing difficulties or pain within the neck. Patient denies any ear pain. He continues to followup with medical oncology and ENT.                              ALLERGIES:  has No Known Allergies.  Meds: Current Outpatient Prescriptions  Medication Sig Dispense Refill  . cyanocobalamin 500 MCG tablet Take 500 mcg by mouth daily.      . diazepam (VALIUM) 5 MG tablet Take 10 mg by mouth every 6 (six) hours as needed for anxiety.       . ferrous sulfate 325 (65 FE) MG tablet Take 325 mg by mouth daily with breakfast.      . folic acid (FOLVITE) 1 MG tablet Take 1 mg by mouth daily.      . mometasone (NASONEX) 50 MCG/ACT nasal spray Place 2 sprays into both nostrils daily.      . phenytoin (DILANTIN) 200 MG ER capsule Take 200 mg by mouth 2 (two) times daily. Takes 2 tabs in am and 1 tab at night      . vitamin E 1000 UNIT capsule Take 1,000 Units by mouth daily.        No current facility-administered medications for this encounter.    Physical Findings: The patient is in no acute distress. Patient is alert and oriented.  height is 6' (1.829 m) and weight is 122 lb 3.2 oz (55.43 kg). His oral temperature is 97.9 F (36.6 C). His blood pressure is 127/61 and his pulse is 93. His respiration is 16 and oxygen saturation is 94%. . The lungs are clear. The heart has a regular rhythm and rate. Examination of the neck and supraclavicular region  reveals no palpable adenopathy. The oral cavity is moist without secondary infection. A fiberoptic exam as not performed in light of the patient's recent exam by ENT  Lab Findings: Lab Results  Component Value Date   WBC 4.1 12/10/2013   HGB 9.0* 12/10/2013   HCT 27.0* 12/10/2013   MCV 113.0* 12/10/2013   PLT 225 12/10/2013      Radiographic Findings: Mr Adrian [name] [facility] Contrast  02/01/2014     [facility] [address] [place],  [address] [phone]  NEUROIMAGING REPORT   STUDY DATE: 01/30/2014 PATIENT NAME: Adrian [name] DOB: 1950/09/12 MRN: [id number]  ORDERING CLINICIAN: Dr Leonie [name] CLINICAL HISTORY: 51 year  Patient with memory loss COMPARISON FILMS: none EXAM:MRI Brain w/wo TECHNIQUE: MRI of the brain with and without contrast was obtained  utilizing 5 mm axial slices with T1, T2, T2 flair, T2 star gradient echo  and diffusion weighted views.  T1 sagittal, T2 coronal and postcontrast  views in the axial and coronal plane were obtained. CONTRAST: 11 ml multihance IMAGING SITE: [facility]  FINDINGS:  The brain parenchyma shows mild changes of chronic  microvascular ischemia.  There is moderate degree of supratentorial and generalized cerebral  atrophy more prominent in the parietal and temporal lobes. No structural  lesion, tumor or infarction noted.No abnormal lesions are seen on  diffusion-weighted views to suggest acute ischemia. The cortical sulci,  fissures and cisterns are normal in size and appearance. Lateral, third  and fourth ventricle are normal in size and appearance. No extra-axial  fluid collections are seen. No evidence of mass effect or midline shift.   No abnormal lesions are seen on post contrast views.  On sagittal views  the posterior fossa, pituitary gland and corpus callosum are unremarkable.  No evidence of intracranial hemorrhage on gradient-echo views. The orbits  and their contents, and calvarium are unremarkable. The paranasal sinuses  show  chronic inflammatory changes The  Intracranial flow voids are  present.      02/01/2014    Abnormal MRI scan of the brain showing moderate degree of  generalized cerebral atrophy and mild changes of chronic microvascular  ischemia and paranasal sinusitis.     INTERPRETING PHYSICIAN:  Antony Contras, MD Certified in  Neuroimaging by [employer] and [employer] [facility]     Impression:  Squamous cell carcinoma of the epiglottis, clinically free of disease.  Plan:  In light of the patient's close followup with medical oncology and ENT, I have not scheduled Adrian [name]  for formal followup appointment will be glad to see him anytime.  ____________________________________ Blair Promise, MD

## 2014-02-04 NOTE — Telephone Encounter (Signed)
Adrian [name] in lab to inform pt of need for monthly labs and order sent by Dr. Alvy Bimler for pt to be scheduled for monthly labs.   Adrian [name] said she will give pt this message as he was waiting for his results.

## 2014-02-04 NOTE — Progress Notes (Signed)
Follow up s/p rad iation epiglottis 05/13/13-06/28/13, no difficulty swallowing, eating most foods, drinking plenty fluids, no pain, no nausea, appetite good stated patient, energy level good, MRI  Brain 01/30/14 results, f/u Dr. Alvy Bimler 04/08/14, 11:49 AM

## 2014-02-05 ENCOUNTER — Telehealth: Payer: Self-pay | Admitting: Hematology and Oncology

## 2014-02-05 NOTE — Telephone Encounter (Signed)
s.w. pt and advised on all appts....pt ok and aware

## 2014-03-05 ENCOUNTER — Ambulatory Visit (INDEPENDENT_AMBULATORY_CARE_PROVIDER_SITE_OTHER): Payer: Medicare Other | Admitting: Neurology

## 2014-03-05 ENCOUNTER — Encounter (INDEPENDENT_AMBULATORY_CARE_PROVIDER_SITE_OTHER): Payer: Self-pay

## 2014-03-05 ENCOUNTER — Encounter: Payer: Self-pay | Admitting: Neurology

## 2014-03-05 VITALS — BP 110/68 | HR 97 | Ht 72.0 in | Wt 121.0 lb

## 2014-03-05 DIAGNOSIS — G3184 Mild cognitive impairment, so stated: Secondary | ICD-10-CM

## 2014-03-05 NOTE — Patient Instructions (Signed)
I had a long discussion with the patient and his wife regarding his seizures and mild cognitive impairment. I recommend we reduce the Dilantin to 200 mg daily for the rest of the year and 100 mg daily starting 05/09/2014 and eventually stop after next visit with me in 4 months as he has been seizure-free for more than 8 years. I discussed her risk for breakthrough seizures with patient and wife and answered questions. I also recommend he start taking 2 fish capsules daily and participated and mentally challenging activities like solving crossword puzzles, playing bridge, sudoku to help with his mild cognitive impairment. Return for followup in 4 months or call earlier if necessary.

## 2014-03-05 NOTE — Progress Notes (Signed)
[facility] [address] [name]. [place] [address] [phone]       OFFICE FOLLOW UP VISIT NOTE  [name] Date of Birth:  Aug 05, 1950 Medical Record Number:  [id number]   Referring MD:  Adrian [name]  Reason for Referral:  seizure  HPI: 63 year male with history of seizures since 30 years which were apparently called alcohol related either due to intoxication or withdrawal. He has been on Dilantin since a long time and he could tell of previous stroke. He has myelodysplastic syndrome with anemia. It is suspected that chronic Dilantin usage may have contributed to his bone marrow suppression fan primary care physician has raised the question as to whether he can come off anticonvulsants since he has been seizure-free for a long time. He denies history of head injury, loss of consciousness, febrile illness for unprovoked seizures. He does not remember any unprovoked seizures which was nonl alcohol related. There is no family history of seizures or epilepsy. UPDATE 03/05/2014 : he returns for followup of her last visit 4 months ago. He has reduced her Dilantin to 300 mg daily without any breakthrough seizures. He continues to have a mild short term memory difficulties which appear to be unchanged. He still independent in most activities of daily living except he does not drive. He has not had any new neurological complaints. He underwent lab work on 12/25/13 which showed vitamin B12, TSH to be normal and RPR to be nonreactive. MRI scan of the brain personally reviewed by me done on 01/30/14 shows moderate changes of generalized cerebral atrophy and mild changes of chronic microvascular ischemia and paranasal sinusitis. EEG done on 12/25/13 personally reviewed by me is normal without any epileptiform features . ROS:   14 system review of systems is positive for shortness of breath, seizure, anxiety, depression and all of the systems negative  PMH:  Past Medical History    Diagnosis Date  . Megaloblastic anemia   . Emphysema of lung   . Arthritis     rheumatoid  . Esophageal reflux   . Anxiety   . Shortness of breath     with exertion  . Pneumonia 01/2013  . Seizures     last one was 8 years ago  . History of blood transfusion   . Cancer 03/11/2013    larnyx/epiglottic Squamous cell in situ  . Throat pain 06/13/2013  . History of radiation therapy 05/13/2013-06/28/2013    70 gray to epiglottis/neck    Social History:  History   Social History  . Marital Status: Married    Spouse Name: Hassan Rowan    Number of Children: 3  . Years of Education: 12th   Occupational History  . Retired Other   Social History Main Topics  . Smoking status: Former Smoker -- 1.00 packs/day for 40 years    Types: Cigarettes    Quit date: 04/15/2013  . Smokeless tobacco: Never Used     Comment: patient states he is slowly quitting on his own - does not want assistance  . Alcohol Use: No     Comment: once every three months/ h/o etoh abuse  . Drug Use: No  . Sexual Activity: Not on file   Other Topics Concern  . Not on file   Social History Narrative   04/12/2013   The patient is married with 3 children. (2 girls and one boy)    The patient has a history of smoking a quarter pack per day for  15 years.   The patient is trying to quit on his own.   Patient has not had any alcohol for the past 6 months by report. Patient usually drinks vodka.   Caffeine Use: 2 cups daily             Medications:   Current Outpatient Prescriptions on File Prior to Visit  Medication Sig Dispense Refill  . cyanocobalamin 500 MCG tablet Take 500 mcg by mouth daily.      . diazepam (VALIUM) 5 MG tablet Take 10 mg by mouth every 6 (six) hours as needed for anxiety.       . ferrous sulfate 325 (65 FE) MG tablet Take 325 mg by mouth daily with breakfast.      . folic acid (FOLVITE) 1 MG tablet Take 1 mg by mouth daily.      . mometasone (NASONEX) 50 MCG/ACT nasal spray Place 2 sprays  into both nostrils daily.      . phenytoin (DILANTIN) 200 MG ER capsule Take 200 mg by mouth 2 (two) times daily. Takes 2 tabs in am and 1 tab at night      . vitamin E 1000 UNIT capsule Take 1,000 Units by mouth daily.        No current facility-administered medications on file prior to visit.    Allergies:  No Known Allergies  Physical Exam General: well developed, well nourished, seated, in no evident distress Head: head normocephalic and atraumatic. Orohparynx benign Neck: supple with no carotid or supraclavicular bruits Cardiovascular: regular rate and rhythm, no murmurs Musculoskeletal: no deformity Skin:  no rash/petichiae. Chronic fungus infection left hand fingernails Vascular:  Normal pulses all extremities Filed Vitals:   03/05/14 1011  BP: 110/68  Pulse: 97    Neurologic Exam Mental Status: Awake and fully alert. Oriented to place and time. Recent and remote memory intact. Attention span, concentration and fund of knowledge appropriate. Mood and affect appropriate. Diminished recall 0/3 animals in a 9. Cranial Nerves: Fundoscopic exam reveals sharp disc margins. Pupils equal, briskly reactive to light. Extraocular movements full without nystagmus. Visual fields full to confrontation. Hearing intact. Facial sensation intact. Face, tongue, palate moves normally and symmetrically.  Motor: Normal bulk and tone. Normal strength in all tested extremity muscles. Sensory.: intact to tough and pinprick and vibratory.  Coordination: Rapid alternating movements normal in all extremities. Finger-to-nose and heel-to-shin performed accurately bilaterally. Gait and Station: Arises from chair without difficulty. Stance is normal. Gait demonstrates normal stride length and balance . Able to heel, toe and tandem walk without difficulty.  Reflexes: 1+ and symmetric. Toes downgoing.    ASSESSMENT: 32 year remote history of seizures since 30 years has been seizure-free since 2008. Most of  the seizures appear to have been alcohol related.He also has a mild cognitive impairment with negative workup for treatable causes    PLAN: I had a long discussion with the patient and his wife regarding his seizures and mild cognitive impairment. I recommend we reduce the Dilantin to 200 mg daily for the rest of the year and 100 mg daily starting 05/09/2014 and eventually stop after next visit with me in 4 months as he has been seizure-free for more than 8 years. I discussed her risk for breakthrough seizures with patient and wife and answered questions. I also recommend he start taking 2 fish capsules daily and participated and mentally challenging activities like solving crossword puzzles, playing bridge, sudoku to help with his mild cognitive impairment. Return for  followup in 4 months or call earlier if necessary.   Note: This document was prepared with digital dictation and possible smart phrase technology. Any transcriptional errors that result from this process are unintentional.

## 2014-03-06 ENCOUNTER — Other Ambulatory Visit: Payer: Self-pay | Admitting: Hematology and Oncology

## 2014-03-06 ENCOUNTER — Other Ambulatory Visit (HOSPITAL_BASED_OUTPATIENT_CLINIC_OR_DEPARTMENT_OTHER): Payer: BC Managed Care – PPO

## 2014-03-06 ENCOUNTER — Telehealth: Payer: Self-pay | Admitting: *Deleted

## 2014-03-06 DIAGNOSIS — D4620 Refractory anemia with excess of blasts, unspecified: Secondary | ICD-10-CM

## 2014-03-06 DIAGNOSIS — C321 Malignant neoplasm of supraglottis: Secondary | ICD-10-CM

## 2014-03-06 LAB — HOLD TUBE, BLOOD BANK

## 2014-03-06 LAB — CBC & DIFF AND RETIC
BASO%: 0.8 % (ref 0.0–2.0)
BASOS ABS: 0 10*3/uL (ref 0.0–0.1)
EOS%: 1.7 % (ref 0.0–7.0)
Eosinophils Absolute: 0.1 10*3/uL (ref 0.0–0.5)
HCT: 26.3 % — ABNORMAL LOW (ref 38.4–49.9)
HEMOGLOBIN: 8.8 g/dL — AB (ref 13.0–17.1)
Immature Retic Fract: 6.9 % (ref 3.00–10.60)
LYMPH#: 0.8 10*3/uL — AB (ref 0.9–3.3)
LYMPH%: 22.9 % (ref 14.0–49.0)
MCH: 38.3 pg — AB (ref 27.2–33.4)
MCHC: 33.5 g/dL (ref 32.0–36.0)
MCV: 114.3 fL — ABNORMAL HIGH (ref 79.3–98.0)
MONO#: 0.3 10*3/uL (ref 0.1–0.9)
MONO%: 9.1 % (ref 0.0–14.0)
NEUT#: 2.3 10*3/uL (ref 1.5–6.5)
NEUT%: 65.5 % (ref 39.0–75.0)
Platelets: 246 10*3/uL (ref 140–400)
RBC: 2.3 10*6/uL — ABNORMAL LOW (ref 4.20–5.82)
RDW: 17.3 % — ABNORMAL HIGH (ref 11.0–14.6)
Retic %: 0.96 % (ref 0.80–1.80)
Retic Ct Abs: 22.08 10*3/uL — ABNORMAL LOW (ref 34.80–93.90)
WBC: 3.5 10*3/uL — AB (ref 4.0–10.3)

## 2014-03-06 NOTE — Telephone Encounter (Signed)
Informed pt no need for transfusion.  Keep lab appt next month as scheduled.  Call us if any changes, problems before then.  He verbalized understanding.

## 2014-03-06 NOTE — Telephone Encounter (Signed)
Message copied by Cathlean Cower on Thu Mar 06, 2014 11:25 AM ------      Message from: [facility], [place]: Thu Mar 06, 2014 10:56 AM      Regarding: No need blood                   ----- Message -----         From: [facility] Interface         Sent: 03/06/2014  10:53 AM           To: [name], MD             ------

## 2014-04-02 ENCOUNTER — Telehealth: Payer: Self-pay | Admitting: *Deleted

## 2014-04-02 ENCOUNTER — Other Ambulatory Visit (HOSPITAL_BASED_OUTPATIENT_CLINIC_OR_DEPARTMENT_OTHER): Payer: Medicare Other

## 2014-04-02 DIAGNOSIS — C321 Malignant neoplasm of supraglottis: Secondary | ICD-10-CM

## 2014-04-02 DIAGNOSIS — D4620 Refractory anemia with excess of blasts, unspecified: Secondary | ICD-10-CM

## 2014-04-02 LAB — CBC & DIFF AND RETIC
BASO%: 1 % (ref 0.0–2.0)
Basophils Absolute: 0 10*3/uL (ref 0.0–0.1)
EOS ABS: 0.1 10*3/uL (ref 0.0–0.5)
EOS%: 2 % (ref 0.0–7.0)
HCT: 26.2 % — ABNORMAL LOW (ref 38.4–49.9)
HGB: 8.8 g/dL — ABNORMAL LOW (ref 13.0–17.1)
IMMATURE RETIC FRACT: 6.3 % (ref 3.00–10.60)
LYMPH%: 21 % (ref 14.0–49.0)
MCH: 38.4 pg — AB (ref 27.2–33.4)
MCHC: 33.6 g/dL (ref 32.0–36.0)
MCV: 114.4 fL — AB (ref 79.3–98.0)
MONO#: 0.3 10*3/uL (ref 0.1–0.9)
MONO%: 10.5 % (ref 0.0–14.0)
NEUT#: 2 10*3/uL (ref 1.5–6.5)
NEUT%: 65.5 % (ref 39.0–75.0)
Platelets: 212 10*3/uL (ref 140–400)
RBC: 2.29 10*6/uL — ABNORMAL LOW (ref 4.20–5.82)
RDW: 16.8 % — AB (ref 11.0–14.6)
Retic %: 0.82 % (ref 0.80–1.80)
Retic Ct Abs: 18.78 10*3/uL — ABNORMAL LOW (ref 34.80–93.90)
WBC: 3.1 10*3/uL — ABNORMAL LOW (ref 4.0–10.3)
lymph#: 0.6 10*3/uL — ABNORMAL LOW (ref 0.9–3.3)

## 2014-04-02 LAB — HOLD TUBE, BLOOD BANK

## 2014-04-02 NOTE — Telephone Encounter (Signed)
-----   Message from [name], MD sent at 04/02/2014 11:19 AM EST ----- Regarding: no need blood   ----- Message -----    From: [facility] Interface    Sent: 04/02/2014  11:14 AM      To: [name], MD

## 2014-04-02 NOTE — Telephone Encounter (Signed)
Informed wife and pt of lab results and no need for transfusion today.  Keep lab/MD appt as scheduled next week.  They verbalized understanding.   They left [employer] parking permit form for Dr. Calton Dach signature.  Informed them we will complete and have for pt on next appt.Marland Kitchen

## 2014-04-04 LAB — ERYTHROPOIETIN: ERYTHROPOIETIN: 107.6 m[IU]/mL — AB (ref 2.6–18.5)

## 2014-04-07 ENCOUNTER — Other Ambulatory Visit: Payer: Self-pay | Admitting: Hematology and Oncology

## 2014-04-08 ENCOUNTER — Ambulatory Visit (HOSPITAL_BASED_OUTPATIENT_CLINIC_OR_DEPARTMENT_OTHER): Payer: Medicare Other | Admitting: Hematology and Oncology

## 2014-04-08 ENCOUNTER — Telehealth: Payer: Self-pay | Admitting: Hematology and Oncology

## 2014-04-08 ENCOUNTER — Other Ambulatory Visit (HOSPITAL_BASED_OUTPATIENT_CLINIC_OR_DEPARTMENT_OTHER): Payer: Medicare Other

## 2014-04-08 ENCOUNTER — Encounter: Payer: Self-pay | Admitting: Hematology and Oncology

## 2014-04-08 ENCOUNTER — Other Ambulatory Visit: Payer: Self-pay | Admitting: Hematology and Oncology

## 2014-04-08 VITALS — BP 143/67 | HR 86 | Temp 97.4°F | Resp 18 | Ht 72.0 in | Wt 120.5 lb

## 2014-04-08 DIAGNOSIS — C321 Malignant neoplasm of supraglottis: Secondary | ICD-10-CM

## 2014-04-08 DIAGNOSIS — Z720 Tobacco use: Secondary | ICD-10-CM | POA: Diagnosis not present

## 2014-04-08 DIAGNOSIS — D4620 Refractory anemia with excess of blasts, unspecified: Secondary | ICD-10-CM | POA: Diagnosis not present

## 2014-04-08 LAB — CBC & DIFF AND RETIC
BASO%: 0.6 % (ref 0.0–2.0)
BASOS ABS: 0 10*3/uL (ref 0.0–0.1)
EOS%: 2.3 % (ref 0.0–7.0)
Eosinophils Absolute: 0.1 10*3/uL (ref 0.0–0.5)
HCT: 27.1 % — ABNORMAL LOW (ref 38.4–49.9)
HEMOGLOBIN: 9.1 g/dL — AB (ref 13.0–17.1)
IMMATURE RETIC FRACT: 7.4 % (ref 3.00–10.60)
LYMPH#: 0.7 10*3/uL — AB (ref 0.9–3.3)
LYMPH%: 21.1 % (ref 14.0–49.0)
MCH: 38.1 pg — ABNORMAL HIGH (ref 27.2–33.4)
MCHC: 33.6 g/dL (ref 32.0–36.0)
MCV: 113.4 fL — ABNORMAL HIGH (ref 79.3–98.0)
MONO#: 0.4 10*3/uL (ref 0.1–0.9)
MONO%: 11.4 % (ref 0.0–14.0)
NEUT%: 64.6 % (ref 39.0–75.0)
NEUTROS ABS: 2.2 10*3/uL (ref 1.5–6.5)
Platelets: 238 10*3/uL (ref 140–400)
RBC: 2.39 10*6/uL — AB (ref 4.20–5.82)
RDW: 16.8 % — AB (ref 11.0–14.6)
RETIC %: 0.93 % (ref 0.80–1.80)
RETIC CT ABS: 22.23 10*3/uL — AB (ref 34.80–93.90)
WBC: 3.4 10*3/uL — ABNORMAL LOW (ref 4.0–10.3)

## 2014-04-08 LAB — HOLD TUBE, BLOOD BANK

## 2014-04-08 MED ORDER — DARBEPOETIN ALFA 300 MCG/0.6ML IJ SOSY
300.0000 ug | PREFILLED_SYRINGE | Freq: Once | INTRAMUSCULAR | Status: AC
  Administered 2014-04-08: 300 ug via SUBCUTANEOUS
  Filled 2014-04-08: qty 0.6

## 2014-04-08 NOTE — Telephone Encounter (Signed)
[facility] dn printed appt sched and avs for pt for Dec thru April

## 2014-04-08 NOTE — Assessment & Plan Note (Signed)
The anemia from MDS is stable. I suspect he may have some degree of bone marrow suppression from his antiseizure medications. He had responded well to topical protein in the past with improvement in energy level. I recommend resumption of darbepoetin. The patient will return every 3 weeks to get blood work checked and he will get injection to keep hemoglobin greater than 10 g.

## 2014-04-08 NOTE — Progress Notes (Signed)
[place] OFFICE PROGRESS NOTE  Patient Care Team: [name], MD as PCP - General (Family Medicine) [name], MD as Consulting Physician (Hematology and Oncology) Brooks Sailors, RN as Registered Nurse (Oncology)  SUMMARY OF ONCOLOGIC HISTORY: Oncology History   MDS, R-IPSS score of 3, low risk (Hg 8.9, +16 chromosome on BM, 2% blast count)   Squamous cell carcinoma of the epiglottis   Primary site: Larynx - Supraglottis (Left)   Staging method: AJCC 7th Edition   Clinical: Stage I (T1, N0, M0) signed by [name], MD on 04/30/2013 12:49 PM   Pathologic: Stage I (T1, N0, cM0) signed by [name], MD on 04/30/2013 12:49 PM   Summary: Stage I (T1, N0, cM0)       MDS (myelodysplastic syndrome), low grade   02/01/2007 Bone Marrow Biopsy BM biopsy was abnormal, overall probable low grade MDS   03/23/2011 - 02/06/2013 Chemotherapy [name], discontinued due to diagnosis of laryngeal ca   09/06/2013 Bone Marrow Biopsy Repeat bone marrow aspirate and biopsy confirmed low-grade myelodysplastic syndrome.   09/17/2013 -  Chemotherapy The patient resumed darbepoetin injections to treat the anemia.    Squamous cell carcinoma of the epiglottis   03/06/2013 Procedure Biopsy from epiglottic region showed invasive [facility]   04/25/2013 Imaging Ct scan showed no other involvement in the LN   05/13/2013 - 06/28/2013 Radiation Therapy He received radiation therapy, 70 gray in 35 fractions to the larynx    INTERVAL HISTORY: Please see below for problem oriented charting. He complained of fatigue. He denies recent lump in his neck. He has resumed smoking again.  REVIEW OF SYSTEMS:   Constitutional: Denies fevers, chills or abnormal weight loss Eyes: Denies blurriness of vision Ears, nose, mouth, throat, and face: Denies mucositis or sore throat Respiratory: Denies cough, dyspnea or wheezes Cardiovascular: Denies palpitation, chest discomfort or lower extremity  swelling Gastrointestinal:  Denies nausea, heartburn or change in bowel habits Skin: Denies abnormal skin rashes Lymphatics: Denies new lymphadenopathy or easy bruising Neurological:Denies numbness, tingling or new weaknesses Behavioral/Psych: Mood is stable, no new changes  All other systems were reviewed with the patient and are negative.  I have reviewed the past medical history, past surgical history, social history and family history with the patient and they are unchanged from previous note.  ALLERGIES:  has No Known Allergies.  MEDICATIONS:  Current Outpatient Prescriptions  Medication Sig Dispense Refill  . cyanocobalamin 500 MCG tablet Take 500 mcg by mouth daily.    . diazepam (VALIUM) 5 MG tablet Take 10 mg by mouth every 6 (six) hours as needed for anxiety.     . ferrous sulfate 325 (65 FE) MG tablet Take 325 mg by mouth daily with breakfast.    . folic acid (FOLVITE) 1 MG tablet Take 1 mg by mouth daily.    . mometasone (NASONEX) 50 MCG/ACT nasal spray Place 2 sprays into both nostrils daily.    . phenytoin (DILANTIN) 200 MG ER capsule Take 200 mg by mouth 2 (two) times daily. Takes 2 tabs in am and 1 tab at night    . vitamin E 1000 UNIT capsule Take 1,000 Units by mouth daily.      No current facility-administered medications for this visit.    PHYSICAL EXAMINATION: ECOG PERFORMANCE STATUS: 0 - Asymptomatic  Filed Vitals:   04/08/14 1202  BP: 143/67  Pulse: 86  Temp: 97.4 F (36.3 C)  Resp: 18   Filed Weights   04/08/14  1202  Weight: 120 lb 8 oz (54.658 kg)    GENERAL:alert, no distress and comfortable SKIN: skin color, texture, turgor are normal, no rashes or significant lesions EYES: normal, Conjunctiva are pink and non-injected, sclera clear OROPHARYNX:no exudate, no erythema and lips, buccal mucosa, and tongue normal  NECK: supple, thyroid normal size, non-tender, without nodularity LYMPH:  no palpable lymphadenopathy in the cervical, axillary or  inguinal LUNGS: clear to auscultation and percussion with normal breathing effort HEART: regular rate & rhythm and no murmurs and no lower extremity edema ABDOMEN:abdomen soft, non-tender and normal bowel sounds Musculoskeletal:no cyanosis of digits and no clubbing  NEURO: alert & oriented x 3 with fluent speech, no focal motor/sensory deficits  LABORATORY DATA:  I have reviewed the data as listed    Component Value Date/Time   NA 139 09/17/2013 1158   NA 139 07/07/2013 0545   K 4.1 09/17/2013 1158   K 4.0 07/07/2013 0545   CL 101 07/07/2013 0545   CL 107 09/19/2012 1325   CO2 20* 09/17/2013 1158   CO2 25 07/07/2013 0545   GLUCOSE 77 09/17/2013 1158   GLUCOSE 81 07/07/2013 0545   GLUCOSE 97 09/19/2012 1325   BUN 17.4 09/17/2013 1158   BUN 25* 07/07/2013 0545   CREATININE 0.9 09/17/2013 1158   CREATININE 0.67 07/07/2013 0545   CALCIUM 9.3 09/17/2013 1158   CALCIUM 8.5 07/07/2013 0545   PROT 8.5* 09/17/2013 1158   PROT 7.2 07/07/2013 0545   ALBUMIN 3.5 09/17/2013 1158   ALBUMIN 2.4* 07/07/2013 0545   AST 10 09/17/2013 1158   AST 19 07/07/2013 0545   ALT <6 09/17/2013 1158   ALT 14 07/07/2013 0545   ALKPHOS 151* 09/17/2013 1158   ALKPHOS 121* 07/07/2013 0545   BILITOT 0.23 09/17/2013 1158   BILITOT 0.2* 07/07/2013 0545   GFRNONAA >90 07/07/2013 0545   GFRAA >90 07/07/2013 0545    No results found for: SPEP, UPEP  Lab Results  Component Value Date   WBC 3.4* 112-26-2015   NEUTROABS 2.2 112-26-2015   HGB 9.1* 112-26-2015   HCT 27.1* 112-26-2015   MCV 113.4* 112-26-2015   PLT 238 112-26-2015      Chemistry      Component Value Date/Time   NA 139 09/17/2013 1158   NA 139 07/07/2013 0545   K 4.1 09/17/2013 1158   K 4.0 07/07/2013 0545   CL 101 07/07/2013 0545   CL 107 09/19/2012 1325   CO2 20* 09/17/2013 1158   CO2 25 07/07/2013 0545   BUN 17.4 09/17/2013 1158   BUN 25* 07/07/2013 0545   CREATININE 0.9 09/17/2013 1158   CREATININE 0.67 07/07/2013 0545       Component Value Date/Time   CALCIUM 9.3 09/17/2013 1158   CALCIUM 8.5 07/07/2013 0545   ALKPHOS 151* 09/17/2013 1158   ALKPHOS 121* 07/07/2013 0545   AST 10 09/17/2013 1158   AST 19 07/07/2013 0545   ALT <6 09/17/2013 1158   ALT 14 07/07/2013 0545   BILITOT 0.23 09/17/2013 1158   BILITOT 0.2* 07/07/2013 0545      ASSESSMENT & PLAN:  Squamous cell carcinoma of the epiglottis Clinically, he has no signs of recurrence. He will continue close follow-up with ENT. I recommend the patient to quit smoking.  MDS (myelodysplastic syndrome), low grade The anemia from MDS is stable. I suspect he may have some degree of bone marrow suppression from his antiseizure medications. He had responded well to topical protein in the past with improvement  in energy level. I recommend resumption of darbepoetin. The patient will return every 3 weeks to get blood work checked and he will get injection to keep hemoglobin greater than 10 g.   No orders of the defined types were placed in this encounter.   All questions were answered. The patient knows to call the clinic with any problems, questions or concerns. No barriers to learning was detected. I spent 25 minutes counseling the patient face to face. The total time spent in the appointment was 30 minutes and more than 50% was on counseling and review of test results     [facility], Farah Lepak, MD 108-16-2015 9:22 PM

## 2014-04-08 NOTE — Assessment & Plan Note (Signed)
Clinically, he has no signs of recurrence. He will continue close follow-up with ENT. I recommend the patient to quit smoking.

## 2014-04-29 ENCOUNTER — Ambulatory Visit (HOSPITAL_BASED_OUTPATIENT_CLINIC_OR_DEPARTMENT_OTHER): Payer: Medicare Other | Admitting: Lab

## 2014-04-29 ENCOUNTER — Ambulatory Visit (HOSPITAL_BASED_OUTPATIENT_CLINIC_OR_DEPARTMENT_OTHER): Payer: Medicare Other

## 2014-04-29 DIAGNOSIS — D4620 Refractory anemia with excess of blasts, unspecified: Secondary | ICD-10-CM

## 2014-04-29 DIAGNOSIS — C321 Malignant neoplasm of supraglottis: Secondary | ICD-10-CM

## 2014-04-29 LAB — CBC & DIFF AND RETIC
BASO%: 0.6 % (ref 0.0–2.0)
Basophils Absolute: 0 10*3/uL (ref 0.0–0.1)
EOS%: 2.5 % (ref 0.0–7.0)
Eosinophils Absolute: 0.1 10*3/uL (ref 0.0–0.5)
HCT: 27.8 % — ABNORMAL LOW (ref 38.4–49.9)
HGB: 9.1 g/dL — ABNORMAL LOW (ref 13.0–17.1)
IMMATURE RETIC FRACT: 11 % — AB (ref 3.00–10.60)
LYMPH%: 22.8 % (ref 14.0–49.0)
MCH: 37.6 pg — AB (ref 27.2–33.4)
MCHC: 32.7 g/dL (ref 32.0–36.0)
MCV: 114.9 fL — AB (ref 79.3–98.0)
MONO#: 0.4 10*3/uL (ref 0.1–0.9)
MONO%: 13.3 % (ref 0.0–14.0)
NEUT#: 2 10*3/uL (ref 1.5–6.5)
NEUT%: 60.8 % (ref 39.0–75.0)
Platelets: 212 10*3/uL (ref 140–400)
RBC: 2.42 10*6/uL — ABNORMAL LOW (ref 4.20–5.82)
RDW: 17 % — ABNORMAL HIGH (ref 11.0–14.6)
RETIC CT ABS: 21.54 10*3/uL — AB (ref 34.80–93.90)
Retic %: 0.89 % (ref 0.80–1.80)
WBC: 3.2 10*3/uL — ABNORMAL LOW (ref 4.0–10.3)
lymph#: 0.7 10*3/uL — ABNORMAL LOW (ref 0.9–3.3)

## 2014-04-29 LAB — HOLD TUBE, BLOOD BANK

## 2014-04-29 MED ORDER — DARBEPOETIN ALFA 300 MCG/0.6ML IJ SOSY
300.0000 ug | PREFILLED_SYRINGE | Freq: Once | INTRAMUSCULAR | Status: AC
  Administered 2014-04-29: 300 ug via SUBCUTANEOUS
  Filled 2014-04-29: qty 0.6

## 2014-04-29 NOTE — Patient Instructions (Signed)
Darbepoetin Alfa injection What is this medicine? DARBEPOETIN ALFA (dar be POE e tin AL fa) helps your body make more red blood cells. It is used to treat anemia caused by chronic kidney failure and chemotherapy. This medicine may be used for other purposes; ask your health care provider or pharmacist if you have questions. COMMON BRAND NAME(S): Aranesp What should I tell my health care provider before I take this medicine? They need to know if you have any of these conditions: -blood clotting disorders or history of blood clots -cancer patient not on chemotherapy -cystic fibrosis -heart disease, such as angina, heart failure, or a history of a heart attack -hemoglobin level of 12 g/dL or greater -high blood pressure -low levels of folate, iron, or vitamin B12 -seizures -an unusual or allergic reaction to darbepoetin, erythropoietin, albumin, hamster proteins, latex, other medicines, foods, dyes, or preservatives -pregnant or trying to get pregnant -breast-feeding How should I use this medicine? This medicine is for injection into a vein or under the skin. It is usually given by a health care professional in a hospital or clinic setting. If you get this medicine at home, you will be taught how to prepare and give this medicine. Do not shake the solution before you withdraw a dose. Use exactly as directed. Take your medicine at regular intervals. Do not take your medicine more often than directed. It is important that you put your used needles and syringes in a special sharps container. Do not put them in a trash can. If you do not have a sharps container, call your pharmacist or healthcare provider to get one. Talk to your pediatrician regarding the use of this medicine in children. While this medicine may be used in children as young as 1 year for selected conditions, precautions do apply. Overdosage: If you think you have taken too much of this medicine contact a poison control center or  emergency room at once. NOTE: This medicine is only for you. Do not share this medicine with others. What if I miss a dose? If you miss a dose, take it as soon as you can. If it is almost time for your next dose, take only that dose. Do not take double or extra doses. What may interact with this medicine? Do not take this medicine with any of the following medications: -epoetin alfa This list may not describe all possible interactions. Give your health care provider a list of all the medicines, herbs, non-prescription drugs, or dietary supplements you use. Also tell them if you smoke, drink alcohol, or use illegal drugs. Some items may interact with your medicine. What should I watch for while using this medicine? Visit your prescriber or health care professional for regular checks on your progress and for the needed blood tests and blood pressure measurements. It is especially important for the doctor to make sure your hemoglobin level is in the desired range, to limit the risk of potential side effects and to give you the best benefit. Keep all appointments for any recommended tests. Check your blood pressure as directed. Ask your doctor what your blood pressure should be and when you should contact him or her. As your body makes more red blood cells, you may need to take iron, folic acid, or vitamin B supplements. Ask your doctor or health care provider which products are right for you. If you have kidney disease continue dietary restrictions, even though this medication can make you feel better. Talk with your doctor or health   care professional about the foods you eat and the vitamins that you take. What side effects may I notice from receiving this medicine? Side effects that you should report to your doctor or health care professional as soon as possible: -allergic reactions like skin rash, itching or hives, swelling of the face, lips, or tongue -breathing problems -changes in vision -chest  pain -confusion, trouble speaking or understanding -feeling faint or lightheaded, falls -high blood pressure -muscle aches or pains -pain, swelling, warmth in the leg -rapid weight gain -severe headaches -sudden numbness or weakness of the face, arm or leg -trouble walking, dizziness, loss of balance or coordination -seizures (convulsions) -swelling of the ankles, feet, hands -unusually weak or tired Side effects that usually do not require medical attention (report to your doctor or health care professional if they continue or are bothersome): -diarrhea -fever, chills (flu-like symptoms) -headaches -nausea, vomiting -redness, stinging, or swelling at site where injected This list may not describe all possible side effects. Call your doctor for medical advice about side effects. You may report side effects to FDA at 1-800-FDA-1088. Where should I keep my medicine? Keep out of the reach of children. Store in a refrigerator between 2 and 8 degrees C (36 and 46 degrees F). Do not freeze. Do not shake. Throw away any unused portion if using a single-dose vial. Throw away any unused medicine after the expiration date. NOTE: This sheet is a summary. It may not cover all possible information. If you have questions about this medicine, talk to your doctor, pharmacist, or health care provider.  2015, [employer]vier/[employer] Standard. (2008-04-08 10:23:57)  

## 2014-05-20 ENCOUNTER — Ambulatory Visit (HOSPITAL_BASED_OUTPATIENT_CLINIC_OR_DEPARTMENT_OTHER): Payer: Medicare Other

## 2014-05-20 ENCOUNTER — Other Ambulatory Visit (HOSPITAL_BASED_OUTPATIENT_CLINIC_OR_DEPARTMENT_OTHER): Payer: Medicare Other

## 2014-05-20 DIAGNOSIS — D649 Anemia, unspecified: Secondary | ICD-10-CM

## 2014-05-20 DIAGNOSIS — D46Z Other myelodysplastic syndromes: Secondary | ICD-10-CM

## 2014-05-20 DIAGNOSIS — D4620 Refractory anemia with excess of blasts, unspecified: Secondary | ICD-10-CM

## 2014-05-20 LAB — CBC & DIFF AND RETIC
BASO%: 0.3 % (ref 0.0–2.0)
Basophils Absolute: 0 10*3/uL (ref 0.0–0.1)
EOS%: 1.1 % (ref 0.0–7.0)
Eosinophils Absolute: 0 10*3/uL (ref 0.0–0.5)
HEMATOCRIT: 29.5 % — AB (ref 38.4–49.9)
HGB: 9.8 g/dL — ABNORMAL LOW (ref 13.0–17.1)
Immature Retic Fract: 4.7 % (ref 3.00–10.60)
LYMPH%: 24.3 % (ref 14.0–49.0)
MCH: 38.3 pg — AB (ref 27.2–33.4)
MCHC: 33.2 g/dL (ref 32.0–36.0)
MCV: 115.2 fL — AB (ref 79.3–98.0)
MONO#: 0.4 10*3/uL (ref 0.1–0.9)
MONO%: 11.3 % (ref 0.0–14.0)
NEUT#: 2.4 10*3/uL (ref 1.5–6.5)
NEUT%: 63 % (ref 39.0–75.0)
PLATELETS: 215 10*3/uL (ref 140–400)
RBC: 2.56 10*6/uL — AB (ref 4.20–5.82)
RDW: 16.8 % — ABNORMAL HIGH (ref 11.0–14.6)
RETIC %: 0.72 % — AB (ref 0.80–1.80)
Retic Ct Abs: 18.43 10*3/uL — ABNORMAL LOW (ref 34.80–93.90)
WBC: 3.8 10*3/uL — AB (ref 4.0–10.3)
lymph#: 0.9 10*3/uL (ref 0.9–3.3)

## 2014-05-20 LAB — HOLD TUBE, BLOOD BANK

## 2014-05-20 MED ORDER — DARBEPOETIN ALFA 300 MCG/0.6ML IJ SOSY
300.0000 ug | PREFILLED_SYRINGE | Freq: Once | INTRAMUSCULAR | Status: AC
  Administered 2014-05-20: 300 ug via SUBCUTANEOUS
  Filled 2014-05-20: qty 0.6

## 2014-05-27 ENCOUNTER — Encounter: Payer: Self-pay | Admitting: Gastroenterology

## 2014-06-09 ENCOUNTER — Other Ambulatory Visit: Payer: Self-pay | Admitting: Hematology and Oncology

## 2014-06-09 DIAGNOSIS — D4620 Refractory anemia with excess of blasts, unspecified: Secondary | ICD-10-CM

## 2014-06-09 DIAGNOSIS — C321 Malignant neoplasm of supraglottis: Secondary | ICD-10-CM

## 2014-06-10 ENCOUNTER — Other Ambulatory Visit (HOSPITAL_BASED_OUTPATIENT_CLINIC_OR_DEPARTMENT_OTHER): Payer: Medicare Other

## 2014-06-10 ENCOUNTER — Ambulatory Visit: Payer: Medicare Other

## 2014-06-10 DIAGNOSIS — D46Z Other myelodysplastic syndromes: Secondary | ICD-10-CM

## 2014-06-10 DIAGNOSIS — D4620 Refractory anemia with excess of blasts, unspecified: Secondary | ICD-10-CM

## 2014-06-10 DIAGNOSIS — D649 Anemia, unspecified: Secondary | ICD-10-CM

## 2014-06-10 DIAGNOSIS — C321 Malignant neoplasm of supraglottis: Secondary | ICD-10-CM

## 2014-06-10 LAB — CBC WITH DIFFERENTIAL/PLATELET
BASO%: 1.4 % (ref 0.0–2.0)
Basophils Absolute: 0 10*3/uL (ref 0.0–0.1)
EOS%: 2.3 % (ref 0.0–7.0)
Eosinophils Absolute: 0.1 10*3/uL (ref 0.0–0.5)
HEMATOCRIT: 31 % — AB (ref 38.4–49.9)
HEMOGLOBIN: 10.1 g/dL — AB (ref 13.0–17.1)
LYMPH#: 0.8 10*3/uL — AB (ref 0.9–3.3)
LYMPH%: 23.2 % (ref 14.0–49.0)
MCH: 37.7 pg — AB (ref 27.2–33.4)
MCHC: 32.4 g/dL (ref 32.0–36.0)
MCV: 116.3 fL — ABNORMAL HIGH (ref 79.3–98.0)
MONO#: 0.4 10*3/uL (ref 0.1–0.9)
MONO%: 12.6 % (ref 0.0–14.0)
NEUT%: 60.5 % (ref 39.0–75.0)
NEUTROS ABS: 2 10*3/uL (ref 1.5–6.5)
PLATELETS: 221 10*3/uL (ref 140–400)
RBC: 2.67 10*6/uL — AB (ref 4.20–5.82)
RDW: 17.3 % — ABNORMAL HIGH (ref 11.0–14.6)
WBC: 3.4 10*3/uL — ABNORMAL LOW (ref 4.0–10.3)

## 2014-06-10 MED ORDER — DARBEPOETIN ALFA 300 MCG/0.6ML IJ SOSY: 300.0000 ug | PREFILLED_SYRINGE | Freq: Once | INTRAMUSCULAR | Status: DC

## 2014-06-21 ENCOUNTER — Emergency Department (HOSPITAL_COMMUNITY): Payer: Medicare Other

## 2014-06-21 ENCOUNTER — Emergency Department (HOSPITAL_COMMUNITY)
Admission: EM | Admit: 2014-06-21 | Discharge: 2014-06-21 | Disposition: A | Discharge status: Home / Self Care | Payer: Medicare Other | Attending: Emergency Medicine | Admitting: Emergency Medicine

## 2014-06-21 ENCOUNTER — Encounter (HOSPITAL_COMMUNITY): Payer: Self-pay | Admitting: Emergency Medicine

## 2014-06-21 DIAGNOSIS — S0083XA Contusion of other part of head, initial encounter: Secondary | ICD-10-CM | POA: Insufficient documentation

## 2014-06-21 DIAGNOSIS — Z7951 Long term (current) use of inhaled steroids: Secondary | ICD-10-CM | POA: Insufficient documentation

## 2014-06-21 DIAGNOSIS — F419 Anxiety disorder, unspecified: Secondary | ICD-10-CM | POA: Insufficient documentation

## 2014-06-21 DIAGNOSIS — R55 Syncope and collapse: Secondary | ICD-10-CM | POA: Diagnosis not present

## 2014-06-21 DIAGNOSIS — Z87891 Personal history of nicotine dependence: Secondary | ICD-10-CM | POA: Insufficient documentation

## 2014-06-21 DIAGNOSIS — R1013 Epigastric pain: Secondary | ICD-10-CM | POA: Insufficient documentation

## 2014-06-21 DIAGNOSIS — Z8709 Personal history of other diseases of the respiratory system: Secondary | ICD-10-CM | POA: Insufficient documentation

## 2014-06-21 DIAGNOSIS — Z923 Personal history of irradiation: Secondary | ICD-10-CM | POA: Diagnosis not present

## 2014-06-21 DIAGNOSIS — Z8719 Personal history of other diseases of the digestive system: Secondary | ICD-10-CM | POA: Diagnosis not present

## 2014-06-21 DIAGNOSIS — M1990 Unspecified osteoarthritis, unspecified site: Secondary | ICD-10-CM | POA: Diagnosis not present

## 2014-06-21 DIAGNOSIS — Y998 Other external cause status: Secondary | ICD-10-CM | POA: Insufficient documentation

## 2014-06-21 DIAGNOSIS — Y9389 Activity, other specified: Secondary | ICD-10-CM | POA: Insufficient documentation

## 2014-06-21 DIAGNOSIS — G40909 Epilepsy, unspecified, not intractable, without status epilepticus: Secondary | ICD-10-CM | POA: Insufficient documentation

## 2014-06-21 DIAGNOSIS — Z8521 Personal history of malignant neoplasm of larynx: Secondary | ICD-10-CM | POA: Insufficient documentation

## 2014-06-21 DIAGNOSIS — D531 Other megaloblastic anemias, not elsewhere classified: Secondary | ICD-10-CM | POA: Diagnosis not present

## 2014-06-21 DIAGNOSIS — W1839XA Other fall on same level, initial encounter: Secondary | ICD-10-CM | POA: Insufficient documentation

## 2014-06-21 DIAGNOSIS — Y9289 Other specified places as the place of occurrence of the external cause: Secondary | ICD-10-CM | POA: Diagnosis not present

## 2014-06-21 DIAGNOSIS — Z8701 Personal history of pneumonia (recurrent): Secondary | ICD-10-CM | POA: Diagnosis not present

## 2014-06-21 DIAGNOSIS — Z79899 Other long term (current) drug therapy: Secondary | ICD-10-CM | POA: Insufficient documentation

## 2014-06-21 DIAGNOSIS — G40919 Epilepsy, unspecified, intractable, without status epilepticus: Secondary | ICD-10-CM

## 2014-06-21 DIAGNOSIS — S0990XA Unspecified injury of head, initial encounter: Secondary | ICD-10-CM

## 2014-06-21 LAB — CBC WITH DIFFERENTIAL/PLATELET
BASOS ABS: 0 10*3/uL (ref 0.0–0.1)
BASOS PCT: 0 % (ref 0–1)
EOS ABS: 0.1 10*3/uL (ref 0.0–0.7)
Eosinophils Relative: 1 % (ref 0–5)
HCT: 29.2 % — ABNORMAL LOW (ref 39.0–52.0)
HEMOGLOBIN: 9.9 g/dL — AB (ref 13.0–17.0)
Lymphocytes Relative: 8 % — ABNORMAL LOW (ref 12–46)
Lymphs Abs: 0.4 10*3/uL — ABNORMAL LOW (ref 0.7–4.0)
MCH: 37.9 pg — AB (ref 26.0–34.0)
MCHC: 33.9 g/dL (ref 30.0–36.0)
MCV: 111.9 fL — ABNORMAL HIGH (ref 78.0–100.0)
MONO ABS: 0.4 10*3/uL (ref 0.1–1.0)
MONOS PCT: 9 % (ref 3–12)
NEUTROS ABS: 4 10*3/uL (ref 1.7–7.7)
Neutrophils Relative %: 82 % — ABNORMAL HIGH (ref 43–77)
Platelets: 193 10*3/uL (ref 150–400)
RBC: 2.61 MIL/uL — ABNORMAL LOW (ref 4.22–5.81)
RDW: 16.5 % — AB (ref 11.5–15.5)
WBC: 4.8 10*3/uL (ref 4.0–10.5)

## 2014-06-21 LAB — PHENYTOIN LEVEL, TOTAL

## 2014-06-21 LAB — COMPREHENSIVE METABOLIC PANEL
ALBUMIN: 3.4 g/dL — AB (ref 3.5–5.2)
ALT: 8 U/L (ref 0–53)
ANION GAP: 8 (ref 5–15)
AST: 15 U/L (ref 0–37)
Alkaline Phosphatase: 129 U/L — ABNORMAL HIGH (ref 39–117)
BILIRUBIN TOTAL: 0.3 mg/dL (ref 0.3–1.2)
BUN: 19 mg/dL (ref 6–23)
CO2: 25 mmol/L (ref 19–32)
CREATININE: 0.83 mg/dL (ref 0.50–1.35)
Calcium: 8.6 mg/dL (ref 8.4–10.5)
Chloride: 106 mmol/L (ref 96–112)
GFR calc Af Amer: >90 mL/min (ref >90)
GFR calc non Af Amer: >90 mL/min (ref >90)
Glucose, Bld: 105 mg/dL — ABNORMAL HIGH (ref 70–99)
Potassium: 3.7 mmol/L (ref 3.5–5.1)
Sodium: 139 mmol/L (ref 135–145)
Total Protein: 8.5 g/dL — ABNORMAL HIGH (ref 6.0–8.3)

## 2014-06-21 LAB — LIPASE, BLOOD: Lipase: 22 U/L (ref 11–59)

## 2014-06-21 LAB — CBG MONITORING, ED: Glucose-Capillary: 94 mg/dL (ref 70–99)

## 2014-06-21 MED ORDER — SODIUM CHLORIDE 0.9 % IV SOLN
1000.0000 mg | Freq: Once | INTRAVENOUS | Status: AC
  Administered 2014-06-21: 1000 mg via INTRAVENOUS
  Filled 2014-06-21: qty 20

## 2014-06-21 MED ORDER — SODIUM CHLORIDE 0.9 % IV SOLN
1000.0000 mg | Freq: Once | INTRAVENOUS | Status: DC
  Filled 2014-06-21: qty 20

## 2014-06-21 MED ORDER — GI COCKTAIL ~~LOC~~
30.0000 mL | Freq: Once | ORAL | Status: AC
  Administered 2014-06-21: 30 mL via ORAL
  Filled 2014-06-21: qty 30

## 2014-06-21 NOTE — ED Notes (Signed)
Pt sts he is feeling after GI cocktail

## 2014-06-21 NOTE — ED Notes (Signed)
Attempted IV x2, unsuccessful. Second RN to attempt

## 2014-06-21 NOTE — ED Notes (Signed)
Per family member: Patient had 1 seizure at 0245 this morning. Pt hit head, small hematoma to L side of forehead. Pt currently ax4, NAD. Pt has Hx of seizure, currently taking Dilantin.

## 2014-06-21 NOTE — Discharge Instructions (Signed)
Go back to taking your Dilantin twice daily. Once in the morning and once at night. Call your neurologist and make an appointment to be seen. Return immediately to the emergency department department for recurrent seizures or for any concerns.

## 2014-06-21 NOTE — ED Provider Notes (Signed)
CSN: [id number]     Arrival date & time 06/21/14  0415 History   First MD Initiated Contact with Patient 06/21/14 0503     Chief Complaint  Patient presents with  . Seizures     (Consider location/radiation/quality/duration/timing/severity/associated sxs/prior Treatment) HPI Patient has a remote history of seizures. His last one was 8 years ago. He is in the process of being tapered off his Dilantin by Dr. Leonie Man. He is down to 100 mg once a day. Patient had a witnessed seizure by family member this evening at 0245. The seizure lasted roughly 5 minutes. It was tonic-clonic in nature. Patient was post ictal afterwards. Patient is not at his baseline mental status. He sustained a contusion to his left frontal scalp. He also complains of mild epigastric pain which has developed since the seizure. He denies any neck pain. He has no focal weakness or numbness. Past Medical History  Diagnosis Date  . Megaloblastic anemia   . Emphysema of lung   . Arthritis     rheumatoid  . Esophageal reflux   . Anxiety   . Shortness of breath     with exertion  . Pneumonia 01/2013  . Seizures     last one was 8 years ago  . History of blood transfusion   . Cancer 03/11/2013    larnyx/epiglottic Squamous cell in situ  . Throat pain 06/13/2013  . History of radiation therapy 05/13/2013-06/28/2013    70 gray to epiglottis/neck   Past Surgical History  Procedure Laterality Date  . Abdominal hernia repair    . Tracheostomy  2014  . Peg placement  2014  . Colon surgery    . Multiple extractions with alveoloplasty N/A 04/16/2013    Procedure: Extraction of tooth #'s 2,5,6,7,10,11,15,17,21,22,27,29 with alveoloplasty, bilateral maxillary fibrous tuberosity reductions, removal of excess tissues of mandibular arch, and mandibular left lingual exostoses reductions.;  Surgeon: Lenn Cal, DDS;  Location: [facility];  Service: Oral Surgery;  Laterality: N/A;   Family History  Problem Relation Age of Onset  .  Hypertension Mother   . Arthritis/Rheumatoid Mother   . Stroke Mother    History  Substance Use Topics  . Smoking status: Former Smoker -- 1.00 packs/day for 40 years    Types: Cigarettes    Quit date: 04/15/2013  . Smokeless tobacco: Never Used     Comment: patient states he is slowly quitting on his own - does not want assistance  . Alcohol Use: No     Comment: once every three months/ h/o etoh abuse    Review of Systems  Constitutional: Negative for fever and chills.  HENT: Negative for facial swelling.   Respiratory: Negative for shortness of breath.   Cardiovascular: Negative for chest pain.  Gastrointestinal: Positive for abdominal pain. Negative for nausea, vomiting and diarrhea.  Musculoskeletal: Negative for back pain and neck pain.  Skin: Positive for wound.  Neurological: Positive for seizures, syncope and headaches. Negative for dizziness, weakness and numbness.  All other systems reviewed and are negative.     Allergies  Review of patient's allergies indicates no known allergies.  Home Medications   Prior to Admission medications   Medication Sig Start Date End Date Taking? Authorizing Provider  cyanocobalamin 500 MCG tablet Take 500 mcg by mouth daily.   Yes Historical Provider, MD  diazepam (VALIUM) 5 MG tablet Take 10 mg by mouth every 6 (six) hours as needed for anxiety.    Yes Historical Provider, MD  ferrous sulfate  325 (65 FE) MG tablet Take 325 mg by mouth daily with breakfast.   Yes Historical Provider, MD  folic acid (FOLVITE) 1 MG tablet Take 1 mg by mouth daily.   Yes Historical Provider, MD  mometasone (NASONEX) 50 MCG/ACT nasal spray Place 2 sprays into both nostrils daily.   Yes Historical Provider, MD  phenytoin (DILANTIN) 200 MG ER capsule Take 200 mg by mouth daily. Takes 2 tabs in am and 1 tab at night   Yes Historical Provider, MD  vitamin E 1000 UNIT capsule Take 1,000 Units by mouth daily.    Yes Historical Provider, MD   BP 132/64 mmHg   Pulse 95  Temp(Src) 98.4 F (36.9 C) (Oral)  Resp 18  Ht 6' (1.829 m)  Wt 120 lb 9.5 oz (54.7 kg)  BMI 16.35 kg/m2  SpO2 93% Physical Exam  Constitutional: He is oriented to person, place, and time. He appears well-developed and well-nourished. No distress.  HENT:  Head: Normocephalic.  Mouth/Throat: Oropharynx is clear and moist.  Hematoma to the left forehead. There is no underlying bony deformity. No intraoral trauma.  Eyes: EOM are normal. Pupils are equal, round, and reactive to light.  Neck: Normal range of motion. Neck supple.  No posterior midline cervical tenderness with palpation.  Cardiovascular: Normal rate and regular rhythm.  Exam reveals no gallop and no friction rub.   No murmur heard. Pulmonary/Chest: Effort normal and breath sounds normal. No respiratory distress. He has no wheezes. He has no rales. He exhibits no tenderness.  Abdominal: Soft. Bowel sounds are normal. He exhibits no distension and no mass. There is tenderness (they're mild epigastric tenderness with palpation. There is no rebound or guarding.). There is no rebound and no guarding.  Musculoskeletal: Normal range of motion. He exhibits no edema or tenderness.  No thoracic or lumbar midline tenderness to palpation.  Neurological: He is alert and oriented to person, place, and time.  5/5 motor in all extremities. Sensation is intact. Cranial nerves II through XII grossly intact.  Skin: Skin is warm and dry. No rash noted. No erythema.  Psychiatric: He has a normal mood and affect. His behavior is normal.  Nursing note and vitals reviewed.   ED Course  Procedures (including critical care time) Labs Review Labs Reviewed  PHENYTOIN LEVEL, TOTAL - Abnormal; Notable for the following:    Phenytoin Lvl <2.5 (*)    All other components within normal limits  CBC WITH DIFFERENTIAL/PLATELET - Abnormal; Notable for the following:    RBC 2.61 (*)    Hemoglobin 9.9 (*)    HCT 29.2 (*)    MCV 111.9 (*)     MCH 37.9 (*)    RDW 16.5 (*)    Neutrophils Relative % 82 (*)    Lymphocytes Relative 8 (*)    Lymphs Abs 0.4 (*)    All other components within normal limits  COMPREHENSIVE METABOLIC PANEL - Abnormal; Notable for the following:    Glucose, Bld 105 (*)    Total Protein 8.5 (*)    Albumin 3.4 (*)    Alkaline Phosphatase 129 (*)    All other components within normal limits  LIPASE, BLOOD  CBG MONITORING, ED    Imaging Review Ct Head Wo Contrast  06/21/2014   CLINICAL DATA:  Seizure at 2:30 this morning.  History of seizures.  EXAM: CT HEAD WITHOUT CONTRAST  TECHNIQUE: Contiguous axial images were obtained from the base of the skull through the vertex without intravenous contrast.  COMPARISON:  MRI of the brain January 30, 2014  FINDINGS: Moderate ventriculomegaly, similar to prior examination and likely on the basis of global parenchymal brain volume loss as there is overall commensurate enlargement of cerebral sulci and cerebellar folia. No intraparenchymal hemorrhage, mass effect nor midline shift. Patchy to call Foley supratentorial white matter hypodensities are within normal range for patient's age and though non-specific suggest sequelae of chronic small vessel ischemic disease. No acute large vascular territory infarcts.  No abnormal extra-axial fluid collections. Basal cisterns are patent. Moderate calcific atherosclerosis of the carotid siphons.  No skull fracture. The included ocular globes and orbital contents are non-suspicious. Trace paranasal sinus mucosal thickening without air-fluid levels. Patient is edentulous. Soft tissue within the external auditory canals most consistent with cerumen.  IMPRESSION: No acute intracranial process.  Moderate atrophy. Moderate to severe white matter changes suggest chronic small vessel ischemic disease, similar to prior imaging.   Electronically Signed   By: Elon Alas   On: 06/21/2014 05:46     EKG Interpretation None      MDM    Final diagnoses:  Breakthrough seizure  Closed head injury, initial encounter    patient loaded with Dilantin advised to increase Dilantin intake to 200 mg twice a day. Also advised to follow-up with his neurologist. He's been given return precautions as voiced understanding.     Julianne Rice, MD 06/22/14 [phone]

## 2014-06-21 NOTE — ED Provider Notes (Signed)
  Physical Exam  BP 132/64 mmHg  Pulse 95  Temp(Src) 98.4 F (36.9 C) (Oral)  Resp 18  Ht 6' (1.829 m)  Wt 120 lb 9.5 oz (54.7 kg)  BMI 16.35 kg/m2  SpO2 93%  Physical Exam  ED Course  Procedures  MDM Patient has had his infusion of Dilantin and no further seizure activity. Will discharge home.      Adrian [name]. Alvino Chapel, MD 06/21/14 1550

## 2014-06-24 ENCOUNTER — Telehealth: Payer: Self-pay | Admitting: Neurology

## 2014-06-24 MED ORDER — LEVETIRACETAM 500 MG PO TABS: ORAL_TABLET | ORAL | Status: DC

## 2014-06-24 NOTE — Telephone Encounter (Signed)
I called the patient, talk with the wife. The patient is being tapered off of Dilantin as he has myelodysplasia, and his oncologist thinks that the Dilantin may be interfering with his bone marrow. He will need to have a medication for seizures. He is only on 100 mg of Dilantin daily, he has been on this dose for one month. He had a seizure recently. I will start him on Keppra, after 2 weeks, he will stop the Dilantin completely. He will need to continue the [place].

## 2014-06-24 NOTE — Telephone Encounter (Signed)
WID please advise

## 2014-06-24 NOTE — Telephone Encounter (Signed)
Pt's wife is calling stating that Dr. Leonie Man had the pt cut back on his phenytoin (DILANTIN) 200 MG ER capsule and he had a seizure this weekend.  Just wanted to follow with Leonie Man, please advise about medication.

## 2014-07-01 ENCOUNTER — Ambulatory Visit: Payer: BC Managed Care – PPO

## 2014-07-01 ENCOUNTER — Other Ambulatory Visit (HOSPITAL_BASED_OUTPATIENT_CLINIC_OR_DEPARTMENT_OTHER): Payer: Medicare Other

## 2014-07-01 DIAGNOSIS — D46Z Other myelodysplastic syndromes: Secondary | ICD-10-CM

## 2014-07-01 DIAGNOSIS — C321 Malignant neoplasm of supraglottis: Secondary | ICD-10-CM

## 2014-07-01 DIAGNOSIS — D4620 Refractory anemia with excess of blasts, unspecified: Secondary | ICD-10-CM

## 2014-07-01 LAB — CBC WITH DIFFERENTIAL/PLATELET
BASO%: 0.6 % (ref 0.0–2.0)
BASOS ABS: 0 10*3/uL (ref 0.0–0.1)
EOS ABS: 0.1 10*3/uL (ref 0.0–0.5)
EOS%: 2.1 % (ref 0.0–7.0)
HCT: 31.1 % — ABNORMAL LOW (ref 38.4–49.9)
HGB: 10.4 g/dL — ABNORMAL LOW (ref 13.0–17.1)
LYMPH%: 27.1 % (ref 14.0–49.0)
MCH: 37.8 pg — ABNORMAL HIGH (ref 27.2–33.4)
MCHC: 33.4 g/dL (ref 32.0–36.0)
MCV: 113.1 fL — ABNORMAL HIGH (ref 79.3–98.0)
MONO#: 0.4 10*3/uL (ref 0.1–0.9)
MONO%: 10.8 % (ref 0.0–14.0)
NEUT#: 2 10*3/uL (ref 1.5–6.5)
NEUT%: 59.4 % (ref 39.0–75.0)
Platelets: 222 10*3/uL (ref 140–400)
RBC: 2.75 10*6/uL — AB (ref 4.20–5.82)
RDW: 16.2 % — ABNORMAL HIGH (ref 11.0–14.6)
WBC: 3.3 10*3/uL — AB (ref 4.0–10.3)
lymph#: 0.9 10*3/uL (ref 0.9–3.3)

## 2014-07-01 MED ORDER — DARBEPOETIN ALFA 300 MCG/0.6ML IJ SOSY: 300.0000 ug | PREFILLED_SYRINGE | Freq: Once | INTRAMUSCULAR | Status: DC

## 2014-07-02 ENCOUNTER — Ambulatory Visit
Admission: RE | Admit: 2014-07-02 | Discharge: 2014-07-02 | Disposition: A | Discharge status: Home / Self Care | Payer: Medicare Other | Source: Ambulatory Visit | Attending: Family Medicine | Admitting: Family Medicine

## 2014-07-02 ENCOUNTER — Other Ambulatory Visit: Payer: Self-pay | Admitting: Family Medicine

## 2014-07-02 DIAGNOSIS — W19XXXA Unspecified fall, initial encounter: Secondary | ICD-10-CM

## 2014-07-02 DIAGNOSIS — M546 Pain in thoracic spine: Secondary | ICD-10-CM

## 2014-07-09 ENCOUNTER — Encounter: Payer: Self-pay | Admitting: Neurology

## 2014-07-09 ENCOUNTER — Ambulatory Visit (INDEPENDENT_AMBULATORY_CARE_PROVIDER_SITE_OTHER): Payer: Medicare Other | Admitting: Neurology

## 2014-07-09 VITALS — BP 120/67 | HR 78 | Ht 72.0 in | Wt 121.0 lb

## 2014-07-09 DIAGNOSIS — R569 Unspecified convulsions: Secondary | ICD-10-CM

## 2014-07-09 NOTE — Patient Instructions (Signed)
I had a long discussion with the patient and his wife. He had remote history of seizures and had been seizure-free on Dilantin for 8 years but had developed myelodysplastic 0 which hematologist thought was Dilantin related and hence was being tapered off Dilantin and unfortunately had his seizure on 06/21/14. I agree with discontinuing Dilantin and continuing Keppra 500 twice daily instead. He is having some mood irritability which may be Related but is likely to settle down. I advised the patient's wife to let me know in case his mood problems do not improve. Check EEG for silent seizures and return for follow-up in 3 months. Do not drive as per [facility].

## 2014-07-09 NOTE — Progress Notes (Signed)
[facility] [address] [place] [address] [phone]       OFFICE FOLLOW UP VISIT NOTE  [name] Date of Birth:  1950/09/29 Medical Record Number:  [id number]   Referring MD:  Adrian [name]  Reason for Referral:  seizure  HPI: 64 year male with history of seizures since 30 years which were apparently called alcohol related either due to intoxication or withdrawal. He has been on Dilantin since a long time and he could tell of previous stroke. He has myelodysplastic syndrome with anemia. It is suspected that chronic Dilantin usage may have contributed to his bone marrow suppression fan primary care physician has raised the question as to whether he can come off anticonvulsants since he has been seizure-free for a long time. He denies history of head injury, loss of consciousness, febrile illness for unprovoked seizures. He does not remember any unprovoked seizures which was nonl alcohol related. There is no family history of seizures or epilepsy. UPDATE 03/05/2014 : he returns for followup of her last visit 4 months ago. He has reduced her Dilantin to 300 mg daily without any breakthrough seizures. He continues to have a mild short term memory difficulties which appear to be unchanged. He still independent in most activities of daily living except he does not drive. He has not had any new neurological complaints. He underwent lab work on 12/25/13 which showed vitamin B12, TSH to be normal and RPR to be nonreactive. MRI scan of the brain personally reviewed by me done on 01/30/14 shows moderate changes of generalized cerebral atrophy and mild changes of chronic microvascular ischemia and paranasal sinusitis. EEG done on 12/25/13 personally reviewed by me is normal without any epileptiform features . Update 07/09/2014 ; he returns for follow-up after last visit 2 months ago. He is accompanied by his wife and stated that he had a generalized tonic-clonic seizure 2  weeks ago when he was on a tapering dose of Dilantin of 100 mg daily. He was seen in the ER where he was loaded with Dilantin but subsequently Dr. Jannifer [name] changed him to [place] 500 twice daily as the plan was to discontinue Dilantin since hematologist felt that it was contributing to his myelodysplasia. Patient has noted some irritable mood and has not felt as alert and wife feels this may be effect of Keppra. However he has been noted for less than 2 weeks. He feels his memory difficulties are unchanged. He scored 23/30 on the Mini-Mental today. ROS:   14 system review of systems is positive for shortness of breath, seizure,   and all of the systems negative  PMH:  Past Medical History  Diagnosis Date  . Megaloblastic anemia   . Emphysema of lung   . Arthritis     rheumatoid  . Esophageal reflux   . Anxiety   . Shortness of breath     with exertion  . Pneumonia 01/2013  . Seizures     last one was 8 years ago  . History of blood transfusion   . Cancer 03/11/2013    larnyx/epiglottic Squamous cell in situ  . Throat pain 06/13/2013  . History of radiation therapy 05/13/2013-06/28/2013    70 gray to epiglottis/neck    Social History:  History   Social History  . Marital Status: Married    Spouse Name: Adrian [name]  . Number of Children: 3  . Years of Education: 12th   Occupational History  . Retired Other   Social History  Main Topics  . Smoking status: Former Smoker -- 1.00 packs/day for 40 years    Types: Cigarettes    Quit date: 04/15/2013  . Smokeless tobacco: Never Used     Comment: patient states he is slowly quitting on his own - does not want assistance  . Alcohol Use: No  . Drug Use: No  . Sexual Activity: Not on file   Other Topics Concern  . Not on file   Social History Narrative   04/12/2013   The patient is married with 3 children. (2 girls and one boy)    The patient has a history of smoking a quarter pack per day for 15 years.   The patient is trying to quit on his  own.   Patient has not had any alcohol for the past 6 months by report. Patient usually drinks vodka.   Caffeine Use: 2 cups daily             Medications:   Current Outpatient Prescriptions on File Prior to Visit  Medication Sig Dispense Refill  . cyanocobalamin 500 MCG tablet Take 500 mcg by mouth daily.    . diazepam (VALIUM) 5 MG tablet Take 10 mg by mouth every 6 (six) hours as needed for anxiety.     . ferrous sulfate 325 (65 FE) MG tablet Take 325 mg by mouth daily with breakfast.    . folic acid (FOLVITE) 1 MG tablet Take 1 mg by mouth daily.    Marland Kitchen levETIRAcetam (KEPPRA) 500 MG tablet One half tablet twice daily for one week, then take 1 tablet twice daily 60 tablet 3  . mometasone (NASONEX) 50 MCG/ACT nasal spray Place 2 sprays into both nostrils daily.    . vitamin E 1000 UNIT capsule Take 1,000 Units by mouth daily.      No current facility-administered medications on file prior to visit.    Allergies:  No Known Allergies  Physical Exam General: well developed, well nourished, seated, in no evident distress Head: head normocephalic and atraumatic.     Neck: supple with no carotid or supraclavicular bruits Cardiovascular: regular rate and rhythm, no murmurs Musculoskeletal: no deformity Skin:  no rash/petichiae. Chronic fungus infection left hand fingernails with dystrophic nails Vascular:  Normal pulses all extremities Filed Vitals:   07/09/14 1043  BP: 120/67  Pulse: 78    Neurologic Exam Mental Status: Awake and fully alert. Oriented to place and time. Recent and remote memory intact. Attention span, concentration and fund of knowledge appropriate. Mood and affect appropriate. Mini-Mental status exam 23/30 with deficits in orientation, attention, recall.. Animal fluency test scored 13. Geriatric depression scale score of 5. Cranial Nerves: Fundoscopic exam not done . Pupils equal, briskly reactive to light. Extraocular movements full without nystagmus. Visual  fields full to confrontation. Hearing intact. Facial sensation intact. Face, tongue, palate moves normally and symmetrically.  Motor: Normal bulk and tone. Normal strength in all tested extremity muscles. Sensory.: intact to tough and pinprick and vibratory.  Coordination: Rapid alternating movements normal in all extremities. Finger-to-nose and heel-to-shin performed accurately bilaterally. Gait and Station: Arises from chair without difficulty. Stance is normal. Gait demonstrates normal stride length and balance . Able to heel, toe and tandem walk without difficulty.  Reflexes: 1+ and symmetric. Toes downgoing.    ASSESSMENT: 3 year remote history of seizures since 30 years has been seizure-free since 2008. Most of the seizures appear to have been alcohol related.He also has a mild cognitive impairment with negative workup  for treatable causes    PLAN:  I had a long discussion with the patient and his wife. He had remote history of seizures and had been seizure-free on Dilantin for 8 years but had developed myelodysplastic 0 which hematologist thought was Dilantin related and hence was being tapered off Dilantin and unfortunately had his seizure on 06/21/14. I agree with discontinuing Dilantin and continuing Keppra 500 twice daily instead. He is having some mood irritability which may be Related but is likely to settle down. I advised the patient's wife to let me know in case his mood problems do not improve. Check EEG for silent seizures and return for follow-up in 3 months. Do not drive as per [facility].   Note: This document was prepared with digital dictation and possible smart phrase technology. Any transcriptional errors that result from this process are unintentional.

## 2014-07-16 ENCOUNTER — Ambulatory Visit (INDEPENDENT_AMBULATORY_CARE_PROVIDER_SITE_OTHER): Payer: Medicare Other | Admitting: Neurology

## 2014-07-16 DIAGNOSIS — R569 Unspecified convulsions: Secondary | ICD-10-CM | POA: Diagnosis not present

## 2014-07-16 NOTE — Procedures (Signed)
    History:  Adrian [name] is a 64 year old gentleman with a history of seizures since age 16. The patient was felt to possibly have alcohol-related seizures. He has been well controlled for number of years, with consideration of discontinuance of his Dilantin. The patient being reevaluated for the seizure issue.  This is a routine EEG. No skull defects are noted. Medications include vitamin B12, Valium, iron supplementation, folic acid, Keppra, Nasonex, and vitamin E.   EEG classification: Normal awake  Description of the recording: The background rhythms of this recording consists of a fairly well modulated medium amplitude alpha rhythm of 8 Hz that is reactive to eye opening and closure. As the record progresses, the patient appears to remain in the waking state throughout the recording. Photic stimulation was performed, resulting in a bilateral and symmetric photic driving response. Hyperventilation was also performed, resulting in a minimal buildup of the background rhythm activities without significant slowing seen. At no time during the recording does there appear to be evidence of spike or spike wave discharges or evidence of focal slowing. EKG monitor shows no evidence of cardiac rhythm abnormalities with a heart rate of 66.  Impression: This is a normal EEG recording in the waking state. No evidence of ictal or interictal discharges are seen.

## 2014-07-22 ENCOUNTER — Ambulatory Visit (HOSPITAL_BASED_OUTPATIENT_CLINIC_OR_DEPARTMENT_OTHER): Payer: Medicare Other

## 2014-07-22 ENCOUNTER — Other Ambulatory Visit (HOSPITAL_BASED_OUTPATIENT_CLINIC_OR_DEPARTMENT_OTHER): Payer: Medicare Other

## 2014-07-22 DIAGNOSIS — D4620 Refractory anemia with excess of blasts, unspecified: Secondary | ICD-10-CM

## 2014-07-22 DIAGNOSIS — C321 Malignant neoplasm of supraglottis: Secondary | ICD-10-CM

## 2014-07-22 LAB — CBC WITH DIFFERENTIAL/PLATELET
BASO%: 0.9 % (ref 0.0–2.0)
BASOS ABS: 0 10*3/uL (ref 0.0–0.1)
EOS%: 3.2 % (ref 0.0–7.0)
Eosinophils Absolute: 0.1 10*3/uL (ref 0.0–0.5)
HCT: 27.1 % — ABNORMAL LOW (ref 38.4–49.9)
HGB: 9.1 g/dL — ABNORMAL LOW (ref 13.0–17.1)
LYMPH%: 33.2 % (ref 14.0–49.0)
MCH: 37.3 pg — AB (ref 27.2–33.4)
MCHC: 33.6 g/dL (ref 32.0–36.0)
MCV: 111.1 fL — AB (ref 79.3–98.0)
MONO#: 0.4 10*3/uL (ref 0.1–0.9)
MONO%: 11.7 % (ref 0.0–14.0)
NEUT#: 1.8 10*3/uL (ref 1.5–6.5)
NEUT%: 51 % (ref 39.0–75.0)
PLATELETS: 182 10*3/uL (ref 140–400)
RBC: 2.44 10*6/uL — AB (ref 4.20–5.82)
RDW: 16.2 % — ABNORMAL HIGH (ref 11.0–14.6)
WBC: 3.4 10*3/uL — ABNORMAL LOW (ref 4.0–10.3)
lymph#: 1.1 10*3/uL (ref 0.9–3.3)

## 2014-07-22 MED ORDER — DARBEPOETIN ALFA 300 MCG/0.6ML IJ SOSY
300.0000 ug | PREFILLED_SYRINGE | Freq: Once | INTRAMUSCULAR | Status: AC
  Administered 2014-07-22: 300 ug via SUBCUTANEOUS
  Filled 2014-07-22: qty 0.6

## 2014-07-23 ENCOUNTER — Encounter: Payer: Self-pay | Admitting: Gastroenterology

## 2014-08-12 ENCOUNTER — Other Ambulatory Visit (HOSPITAL_BASED_OUTPATIENT_CLINIC_OR_DEPARTMENT_OTHER): Payer: Medicare Other

## 2014-08-12 ENCOUNTER — Ambulatory Visit (HOSPITAL_BASED_OUTPATIENT_CLINIC_OR_DEPARTMENT_OTHER): Payer: Medicare Other

## 2014-08-12 ENCOUNTER — Ambulatory Visit (HOSPITAL_BASED_OUTPATIENT_CLINIC_OR_DEPARTMENT_OTHER): Payer: Medicare Other | Admitting: Hematology and Oncology

## 2014-08-12 ENCOUNTER — Encounter: Payer: Self-pay | Admitting: Hematology and Oncology

## 2014-08-12 ENCOUNTER — Telehealth: Payer: Self-pay | Admitting: Hematology and Oncology

## 2014-08-12 VITALS — BP 155/73 | HR 71 | Temp 98.0°F | Resp 18 | Ht 72.0 in | Wt 119.4 lb

## 2014-08-12 DIAGNOSIS — Z720 Tobacco use: Secondary | ICD-10-CM

## 2014-08-12 DIAGNOSIS — C101 Malignant neoplasm of anterior surface of epiglottis: Secondary | ICD-10-CM | POA: Diagnosis not present

## 2014-08-12 DIAGNOSIS — C321 Malignant neoplasm of supraglottis: Secondary | ICD-10-CM

## 2014-08-12 DIAGNOSIS — D46Z Other myelodysplastic syndromes: Secondary | ICD-10-CM

## 2014-08-12 DIAGNOSIS — G40909 Epilepsy, unspecified, not intractable, without status epilepticus: Secondary | ICD-10-CM

## 2014-08-12 DIAGNOSIS — D4620 Refractory anemia with excess of blasts, unspecified: Secondary | ICD-10-CM

## 2014-08-12 DIAGNOSIS — G4089 Other seizures: Secondary | ICD-10-CM | POA: Diagnosis not present

## 2014-08-12 LAB — CBC WITH DIFFERENTIAL/PLATELET
BASO%: 0.7 % (ref 0.0–2.0)
Basophils Absolute: 0 10*3/uL (ref 0.0–0.1)
EOS%: 1 % (ref 0.0–7.0)
Eosinophils Absolute: 0 10*3/uL (ref 0.0–0.5)
HCT: 27.6 % — ABNORMAL LOW (ref 38.4–49.9)
HGB: 9 g/dL — ABNORMAL LOW (ref 13.0–17.1)
LYMPH%: 28.3 % (ref 14.0–49.0)
MCH: 36.4 pg — ABNORMAL HIGH (ref 27.2–33.4)
MCHC: 32.6 g/dL (ref 32.0–36.0)
MCV: 111.7 fL — AB (ref 79.3–98.0)
MONO#: 0.4 10*3/uL (ref 0.1–0.9)
MONO%: 12.1 % (ref 0.0–14.0)
NEUT#: 1.7 10*3/uL (ref 1.5–6.5)
NEUT%: 57.9 % (ref 39.0–75.0)
PLATELETS: 210 10*3/uL (ref 140–400)
RBC: 2.47 10*6/uL — ABNORMAL LOW (ref 4.20–5.82)
RDW: 17.1 % — ABNORMAL HIGH (ref 11.0–14.6)
WBC: 2.9 10*3/uL — AB (ref 4.0–10.3)
lymph#: 0.8 10*3/uL — ABNORMAL LOW (ref 0.9–3.3)

## 2014-08-12 MED ORDER — DARBEPOETIN ALFA 300 MCG/0.6ML IJ SOSY
300.0000 ug | PREFILLED_SYRINGE | Freq: Once | INTRAMUSCULAR | Status: AC
  Administered 2014-08-12: 300 ug via SUBCUTANEOUS
  Filled 2014-08-12: qty 0.6

## 2014-08-12 NOTE — Assessment & Plan Note (Signed)
Clinically, he has no signs of recurrence. He will continue close follow-up with ENT. I recommend the patient to quit smoking.

## 2014-08-12 NOTE — Assessment & Plan Note (Signed)
The anemia from MDS is stable. I suspect he may have some degree of bone marrow suppression from his antiseizure medications. He had responded well to Darbopeitin in the past with improvement in energy level. I recommend resumption of darbepoetin. The patient will return every 3 weeks to get blood work checked and he will get injection to keep hemoglobin greater than 10 g.

## 2014-08-12 NOTE — Telephone Encounter (Signed)
Gave avs & calendar for April thru October.

## 2014-08-12 NOTE — Progress Notes (Signed)
[place] OFFICE PROGRESS NOTE  Patient Care Team: [name], MD as PCP - General (Family Medicine) [name], MD as Consulting Physician (Hematology and Oncology) Leota Sauers, RN as Registered Nurse (Oncology)  SUMMARY OF ONCOLOGIC HISTORY: Oncology History   MDS, R-IPSS score of 3, low risk (Hg 8.9, +16 chromosome on BM, 2% blast count)   Squamous cell carcinoma of the epiglottis   Primary site: Larynx - Supraglottis (Left)   Staging method: AJCC 7th Edition   Clinical: Stage I (T1, N0, M0) signed by [name], MD on 04/30/2013 12:49 PM   Pathologic: Stage I (T1, N0, cM0) signed by [name], MD on 04/30/2013 12:49 PM   Summary: Stage I (T1, N0, cM0)       MDS (myelodysplastic syndrome), low grade   02/01/2007 Bone Marrow Biopsy BM biopsy was abnormal, overall probable low grade MDS   03/23/2011 - 02/06/2013 Chemotherapy [name], discontinued due to diagnosis of laryngeal ca   09/06/2013 Bone Marrow Biopsy Repeat bone marrow aspirate and biopsy confirmed low-grade myelodysplastic syndrome.   09/17/2013 -  Chemotherapy The patient resumed darbepoetin injections to treat the anemia.    Squamous cell carcinoma of the epiglottis   03/06/2013 Procedure Biopsy from epiglottic region showed invasive [facility]   04/25/2013 Imaging Ct scan showed no other involvement in the LN   05/13/2013 - 06/28/2013 Radiation Therapy He received radiation therapy, 70 gray in 35 fractions to the larynx    INTERVAL HISTORY: Please see below for problem oriented charting. He is doing well. Denies new lymphadenopathy. No change in voice. No recent infection. He denies symptoms of anemia. The patient denies any recent signs or symptoms of bleeding such as spontaneous epistaxis, hematuria or hematochezia. He continues to smoke.  REVIEW OF SYSTEMS:   Constitutional: Denies fevers, chills or abnormal weight loss Eyes: Denies blurriness of vision Ears, nose, mouth,  throat, and face: Denies mucositis or sore throat Respiratory: Denies cough, dyspnea or wheezes Cardiovascular: Denies palpitation, chest discomfort or lower extremity swelling Gastrointestinal:  Denies nausea, heartburn or change in bowel habits Skin: Denies abnormal skin rashes Lymphatics: Denies new lymphadenopathy or easy bruising Neurological:Denies numbness, tingling or new weaknesses Behavioral/Psych: Mood is stable, no new changes  All other systems were reviewed with the patient and are negative.  I have reviewed the past medical history, past surgical history, social history and family history with the patient and they are unchanged from previous note.  ALLERGIES:  has No Known Allergies.  MEDICATIONS:  Current Outpatient Prescriptions  Medication Sig Dispense Refill  . cyanocobalamin 500 MCG tablet Take 500 mcg by mouth daily.    . diazepam (VALIUM) 5 MG tablet Take 10 mg by mouth every 6 (six) hours as needed for anxiety.     . ferrous sulfate 325 (65 FE) MG tablet Take 325 mg by mouth daily with breakfast.    . folic acid (FOLVITE) 1 MG tablet Take 1 mg by mouth daily.    Marland Kitchen levETIRAcetam (KEPPRA) 500 MG tablet One half tablet twice daily for one week, then take 1 tablet twice daily 60 tablet 3  . mometasone (NASONEX) 50 MCG/ACT nasal spray Place 2 sprays into both nostrils daily.    . vitamin E 1000 UNIT capsule Take 1,000 Units by mouth daily.      No current facility-administered medications for this visit.    PHYSICAL EXAMINATION: ECOG PERFORMANCE STATUS: 0 - Asymptomatic  Filed Vitals:   08/12/14 1207  BP: 155/73  Pulse: 71  Temp: 98 F (36.7 C)  Resp: 18   Filed Weights   08/12/14 1207  Weight: 119 lb 6.4 oz (54.159 kg)    GENERAL:alert, no distress and comfortable SKIN: skin color, texture, turgor are normal, no rashes or significant lesions EYES: normal, Conjunctiva are pink and non-injected, sclera clear OROPHARYNX:no exudate, no erythema and lips,  buccal mucosa, and tongue normal  NECK: supple, thyroid normal size, non-tender, without nodularity LYMPH:  no palpable lymphadenopathy in the cervical, axillary or inguinal LUNGS: clear to auscultation and percussion with normal breathing effort HEART: regular rate & rhythm and no murmurs and no lower extremity edema ABDOMEN:abdomen soft, non-tender and normal bowel sounds Musculoskeletal:no cyanosis of digits and no clubbing  NEURO: alert & oriented x 3 with fluent speech, no focal motor/sensory deficits  LABORATORY DATA:  I have reviewed the data as listed    Component Value Date/Time   NA 139 06/21/2014 0521   NA 139 09/17/2013 1158   K 3.7 06/21/2014 0521   K 4.1 09/17/2013 1158   CL 106 06/21/2014 0521   CL 107 09/19/2012 1325   CO2 25 06/21/2014 0521   CO2 20* 09/17/2013 1158   GLUCOSE 105* 06/21/2014 0521   GLUCOSE 77 09/17/2013 1158   GLUCOSE 97 09/19/2012 1325   BUN 19 06/21/2014 0521   BUN 17.4 09/17/2013 1158   CREATININE 0.83 06/21/2014 0521   CREATININE 0.9 09/17/2013 1158   CALCIUM 8.6 06/21/2014 0521   CALCIUM 9.3 09/17/2013 1158   PROT 8.5* 06/21/2014 0521   PROT 8.5* 09/17/2013 1158   ALBUMIN 3.4* 06/21/2014 0521   ALBUMIN 3.5 09/17/2013 1158   AST 15 06/21/2014 0521   AST 10 09/17/2013 1158   ALT 8 06/21/2014 0521   ALT <6 09/17/2013 1158   ALKPHOS 129* 06/21/2014 0521   ALKPHOS 151* 09/17/2013 1158   BILITOT 0.3 06/21/2014 0521   BILITOT 0.23 09/17/2013 1158   GFRNONAA >90 06/21/2014 0521   GFRAA >90 06/21/2014 0521    No results found for: SPEP, UPEP  Lab Results  Component Value Date   WBC 2.9* 08/12/2014   NEUTROABS 1.7 08/12/2014   HGB 9.0* 08/12/2014   HCT 27.6* 08/12/2014   MCV 111.7* 08/12/2014   PLT 210 08/12/2014      Chemistry      Component Value Date/Time   NA 139 06/21/2014 0521   NA 139 09/17/2013 1158   K 3.7 06/21/2014 0521   K 4.1 09/17/2013 1158   CL 106 06/21/2014 0521   CL 107 09/19/2012 1325   CO2 25  06/21/2014 0521   CO2 20* 09/17/2013 1158   BUN 19 06/21/2014 0521   BUN 17.4 09/17/2013 1158   CREATININE 0.83 06/21/2014 0521   CREATININE 0.9 09/17/2013 1158      Component Value Date/Time   CALCIUM 8.6 06/21/2014 0521   CALCIUM 9.3 09/17/2013 1158   ALKPHOS 129* 06/21/2014 0521   ALKPHOS 151* 09/17/2013 1158   AST 15 06/21/2014 0521   AST 10 09/17/2013 1158   ALT 8 06/21/2014 0521   ALT <6 09/17/2013 1158   BILITOT 0.3 06/21/2014 0521   BILITOT 0.23 09/17/2013 1158       ASSESSMENT & PLAN:   Squamous cell carcinoma of the epiglottis Clinically, he has no signs of recurrence. He will continue close follow-up with ENT. I recommend the patient to quit smoking.   MDS (myelodysplastic syndrome), low grade The anemia from MDS is stable. I suspect he may have some  degree of bone marrow suppression from his antiseizure medications. He had responded well to Darbopeitin in the past with improvement in energy level. I recommend resumption of darbepoetin. The patient will return every 3 weeks to get blood work checked and he will get injection to keep hemoglobin greater than 10 g.   Seizure disorder He denies recurrence of seizures in the past 8 years. His of antiseizure medication was switched to [place] and he is doing well without any recurrence of seizure.   Tobacco abuse I spent some time counseling the patient the importance of tobacco cessation. he is currently attempting to quit on his own      All questions were answered. The patient knows to call the clinic with any problems, questions or concerns. No barriers to learning was detected. I spent 15 minutes counseling the patient face to face. The total time spent in the appointment was 20 minutes and more than 50% was on counseling and review of test results     [facility], [place], MD 08/12/2014 12:19 PM

## 2014-08-12 NOTE — Assessment & Plan Note (Signed)
He denies recurrence of seizures in the past 8 years. His of antiseizure medication was switched to [place] and he is doing well without any recurrence of seizure.

## 2014-08-12 NOTE — Assessment & Plan Note (Signed)
I spent some time counseling the patient the importance of tobacco cessation. he is currently attempting to quit on his own

## 2014-09-02 ENCOUNTER — Other Ambulatory Visit (HOSPITAL_BASED_OUTPATIENT_CLINIC_OR_DEPARTMENT_OTHER): Payer: Medicare Other

## 2014-09-02 ENCOUNTER — Ambulatory Visit (HOSPITAL_BASED_OUTPATIENT_CLINIC_OR_DEPARTMENT_OTHER): Payer: Medicare Other

## 2014-09-02 VITALS — BP 146/63 | HR 76 | Temp 98.3°F

## 2014-09-02 DIAGNOSIS — C321 Malignant neoplasm of supraglottis: Secondary | ICD-10-CM

## 2014-09-02 DIAGNOSIS — D4620 Refractory anemia with excess of blasts, unspecified: Secondary | ICD-10-CM

## 2014-09-02 DIAGNOSIS — D638 Anemia in other chronic diseases classified elsewhere: Secondary | ICD-10-CM

## 2014-09-02 LAB — CBC WITH DIFFERENTIAL/PLATELET
BASO%: 0.9 % (ref 0.0–2.0)
Basophils Absolute: 0 10*3/uL (ref 0.0–0.1)
EOS%: 1.6 % (ref 0.0–7.0)
Eosinophils Absolute: 0.1 10*3/uL (ref 0.0–0.5)
HEMATOCRIT: 28 % — AB (ref 38.4–49.9)
HGB: 9.3 g/dL — ABNORMAL LOW (ref 13.0–17.1)
LYMPH%: 36 % (ref 14.0–49.0)
MCH: 37.7 pg — ABNORMAL HIGH (ref 27.2–33.4)
MCHC: 33.2 g/dL (ref 32.0–36.0)
MCV: 113.4 fL — AB (ref 79.3–98.0)
MONO#: 0.4 10*3/uL (ref 0.1–0.9)
MONO%: 11.4 % (ref 0.0–14.0)
NEUT#: 1.6 10*3/uL (ref 1.5–6.5)
NEUT%: 50.1 % (ref 39.0–75.0)
Platelets: 225 10*3/uL (ref 140–400)
RBC: 2.47 10*6/uL — ABNORMAL LOW (ref 4.20–5.82)
RDW: 17.5 % — AB (ref 11.0–14.6)
WBC: 3.2 10*3/uL — ABNORMAL LOW (ref 4.0–10.3)
lymph#: 1.1 10*3/uL (ref 0.9–3.3)

## 2014-09-02 MED ORDER — DARBEPOETIN ALFA 300 MCG/0.6ML IJ SOSY
300.0000 ug | PREFILLED_SYRINGE | Freq: Once | INTRAMUSCULAR | Status: AC
  Administered 2014-09-02: 300 ug via SUBCUTANEOUS
  Filled 2014-09-02: qty 0.6

## 2014-09-23 ENCOUNTER — Other Ambulatory Visit (HOSPITAL_BASED_OUTPATIENT_CLINIC_OR_DEPARTMENT_OTHER): Payer: Medicare Other

## 2014-09-23 ENCOUNTER — Ambulatory Visit (HOSPITAL_BASED_OUTPATIENT_CLINIC_OR_DEPARTMENT_OTHER): Payer: Medicare Other

## 2014-09-23 VITALS — BP 128/69 | HR 76 | Temp 98.4°F

## 2014-09-23 DIAGNOSIS — D4620 Refractory anemia with excess of blasts, unspecified: Secondary | ICD-10-CM

## 2014-09-23 DIAGNOSIS — C321 Malignant neoplasm of supraglottis: Secondary | ICD-10-CM

## 2014-09-23 LAB — CBC WITH DIFFERENTIAL/PLATELET
BASO%: 0.6 % (ref 0.0–2.0)
Basophils Absolute: 0 10*3/uL (ref 0.0–0.1)
EOS ABS: 0 10*3/uL (ref 0.0–0.5)
EOS%: 0.9 % (ref 0.0–7.0)
HCT: 27.7 % — ABNORMAL LOW (ref 38.4–49.9)
HGB: 9.2 g/dL — ABNORMAL LOW (ref 13.0–17.1)
LYMPH%: 26.2 % (ref 14.0–49.0)
MCH: 36.8 pg — ABNORMAL HIGH (ref 27.2–33.4)
MCHC: 33.2 g/dL (ref 32.0–36.0)
MCV: 110.8 fL — ABNORMAL HIGH (ref 79.3–98.0)
MONO#: 0.3 10*3/uL (ref 0.1–0.9)
MONO%: 9.6 % (ref 0.0–14.0)
NEUT%: 62.7 % (ref 39.0–75.0)
NEUTROS ABS: 2.2 10*3/uL (ref 1.5–6.5)
PLATELETS: 211 10*3/uL (ref 140–400)
RBC: 2.5 10*6/uL — ABNORMAL LOW (ref 4.20–5.82)
RDW: 16.8 % — ABNORMAL HIGH (ref 11.0–14.6)
WBC: 3.4 10*3/uL — ABNORMAL LOW (ref 4.0–10.3)
lymph#: 0.9 10*3/uL (ref 0.9–3.3)

## 2014-09-23 MED ORDER — DARBEPOETIN ALFA 300 MCG/0.6ML IJ SOSY
300.0000 ug | PREFILLED_SYRINGE | Freq: Once | INTRAMUSCULAR | Status: AC
  Administered 2014-09-23: 300 ug via SUBCUTANEOUS
  Filled 2014-09-23: qty 0.6

## 2014-10-03 ENCOUNTER — Encounter: Payer: Self-pay | Admitting: Gastroenterology

## 2014-10-14 ENCOUNTER — Ambulatory Visit (HOSPITAL_BASED_OUTPATIENT_CLINIC_OR_DEPARTMENT_OTHER): Payer: Medicare Other

## 2014-10-14 ENCOUNTER — Other Ambulatory Visit (HOSPITAL_BASED_OUTPATIENT_CLINIC_OR_DEPARTMENT_OTHER): Payer: Medicare Other

## 2014-10-14 VITALS — BP 129/68 | HR 70 | Temp 98.5°F

## 2014-10-14 DIAGNOSIS — D4620 Refractory anemia with excess of blasts, unspecified: Secondary | ICD-10-CM

## 2014-10-14 DIAGNOSIS — C321 Malignant neoplasm of supraglottis: Secondary | ICD-10-CM

## 2014-10-14 LAB — CBC WITH DIFFERENTIAL/PLATELET
BASO%: 1.3 % (ref 0.0–2.0)
Basophils Absolute: 0 10*3/uL (ref 0.0–0.1)
EOS%: 2.3 % (ref 0.0–7.0)
Eosinophils Absolute: 0.1 10*3/uL (ref 0.0–0.5)
HCT: 27.7 % — ABNORMAL LOW (ref 38.4–49.9)
HGB: 9.1 g/dL — ABNORMAL LOW (ref 13.0–17.1)
LYMPH#: 1 10*3/uL (ref 0.9–3.3)
LYMPH%: 32.9 % (ref 14.0–49.0)
MCH: 36.5 pg — ABNORMAL HIGH (ref 27.2–33.4)
MCHC: 32.9 g/dL (ref 32.0–36.0)
MCV: 111.2 fL — ABNORMAL HIGH (ref 79.3–98.0)
MONO#: 0.3 10*3/uL (ref 0.1–0.9)
MONO%: 10.3 % (ref 0.0–14.0)
NEUT#: 1.7 10*3/uL (ref 1.5–6.5)
NEUT%: 53.2 % (ref 39.0–75.0)
Platelets: 212 10*3/uL (ref 140–400)
RBC: 2.49 10*6/uL — ABNORMAL LOW (ref 4.20–5.82)
RDW: 16.5 % — AB (ref 11.0–14.6)
WBC: 3.1 10*3/uL — AB (ref 4.0–10.3)

## 2014-10-14 MED ORDER — DARBEPOETIN ALFA 300 MCG/0.6ML IJ SOSY
300.0000 ug | PREFILLED_SYRINGE | Freq: Once | INTRAMUSCULAR | Status: AC
  Administered 2014-10-14: 300 ug via SUBCUTANEOUS
  Filled 2014-10-14: qty 0.6

## 2014-10-15 ENCOUNTER — Ambulatory Visit: Payer: BC Managed Care – PPO | Admitting: Neurology

## 2014-10-22 ENCOUNTER — Other Ambulatory Visit: Payer: Self-pay | Admitting: Neurology

## 2014-11-04 ENCOUNTER — Other Ambulatory Visit (HOSPITAL_BASED_OUTPATIENT_CLINIC_OR_DEPARTMENT_OTHER): Payer: Medicare Other

## 2014-11-04 ENCOUNTER — Ambulatory Visit (HOSPITAL_BASED_OUTPATIENT_CLINIC_OR_DEPARTMENT_OTHER): Payer: Medicare Other

## 2014-11-04 VITALS — BP 133/64 | HR 63 | Temp 97.7°F | Resp 20

## 2014-11-04 DIAGNOSIS — D4620 Refractory anemia with excess of blasts, unspecified: Secondary | ICD-10-CM | POA: Diagnosis not present

## 2014-11-04 DIAGNOSIS — C321 Malignant neoplasm of supraglottis: Secondary | ICD-10-CM

## 2014-11-04 LAB — CBC WITH DIFFERENTIAL/PLATELET
BASO%: 1 % (ref 0.0–2.0)
Basophils Absolute: 0 10*3/uL (ref 0.0–0.1)
EOS ABS: 0.1 10*3/uL (ref 0.0–0.5)
EOS%: 2.7 % (ref 0.0–7.0)
HCT: 28.2 % — ABNORMAL LOW (ref 38.4–49.9)
HEMOGLOBIN: 9.3 g/dL — AB (ref 13.0–17.1)
LYMPH%: 28.2 % (ref 14.0–49.0)
MCH: 37.7 pg — ABNORMAL HIGH (ref 27.2–33.4)
MCHC: 32.8 g/dL (ref 32.0–36.0)
MCV: 114.8 fL — AB (ref 79.3–98.0)
MONO#: 0.4 10*3/uL (ref 0.1–0.9)
MONO%: 12.4 % (ref 0.0–14.0)
NEUT#: 1.7 10*3/uL (ref 1.5–6.5)
NEUT%: 55.7 % (ref 39.0–75.0)
PLATELETS: 214 10*3/uL (ref 140–400)
RBC: 2.46 10*6/uL — AB (ref 4.20–5.82)
RDW: 17.4 % — AB (ref 11.0–14.6)
WBC: 3.1 10*3/uL — ABNORMAL LOW (ref 4.0–10.3)
lymph#: 0.9 10*3/uL (ref 0.9–3.3)

## 2014-11-04 MED ORDER — DARBEPOETIN ALFA 300 MCG/0.6ML IJ SOSY
300.0000 ug | PREFILLED_SYRINGE | Freq: Once | INTRAMUSCULAR | Status: AC
  Administered 2014-11-04: 300 ug via SUBCUTANEOUS
  Filled 2014-11-04: qty 0.6

## 2014-11-18 ENCOUNTER — Telehealth: Payer: Self-pay | Admitting: Hematology and Oncology

## 2014-11-18 NOTE — Telephone Encounter (Signed)
s.w. pt and r/s appt to earlier time...done...pt ok and aware of new time

## 2014-11-18 NOTE — Telephone Encounter (Signed)
returned call and confirmed appts....pt ok and aware

## 2014-11-25 ENCOUNTER — Ambulatory Visit: Payer: Self-pay

## 2014-11-25 ENCOUNTER — Other Ambulatory Visit: Payer: Self-pay

## 2014-11-25 ENCOUNTER — Ambulatory Visit (HOSPITAL_BASED_OUTPATIENT_CLINIC_OR_DEPARTMENT_OTHER): Payer: Medicare Other

## 2014-11-25 ENCOUNTER — Other Ambulatory Visit (HOSPITAL_BASED_OUTPATIENT_CLINIC_OR_DEPARTMENT_OTHER): Payer: Medicare Other

## 2014-11-25 VITALS — BP 134/82 | HR 81 | Temp 98.4°F

## 2014-11-25 DIAGNOSIS — C321 Malignant neoplasm of supraglottis: Secondary | ICD-10-CM

## 2014-11-25 DIAGNOSIS — D4620 Refractory anemia with excess of blasts, unspecified: Secondary | ICD-10-CM

## 2014-11-25 LAB — CBC WITH DIFFERENTIAL/PLATELET
BASO%: 0.9 % (ref 0.0–2.0)
Basophils Absolute: 0 10*3/uL (ref 0.0–0.1)
EOS%: 2.1 % (ref 0.0–7.0)
Eosinophils Absolute: 0.1 10*3/uL (ref 0.0–0.5)
HCT: 29.2 % — ABNORMAL LOW (ref 38.4–49.9)
HGB: 9.6 g/dL — ABNORMAL LOW (ref 13.0–17.1)
LYMPH%: 31.4 % (ref 14.0–49.0)
MCH: 37.3 pg — ABNORMAL HIGH (ref 27.2–33.4)
MCHC: 32.7 g/dL (ref 32.0–36.0)
MCV: 113.9 fL — AB (ref 79.3–98.0)
MONO#: 0.4 10*3/uL (ref 0.1–0.9)
MONO%: 12.1 % (ref 0.0–14.0)
NEUT%: 53.5 % (ref 39.0–75.0)
NEUTROS ABS: 1.6 10*3/uL (ref 1.5–6.5)
Platelets: 201 10*3/uL (ref 140–400)
RBC: 2.57 10*6/uL — AB (ref 4.20–5.82)
RDW: 17.4 % — ABNORMAL HIGH (ref 11.0–14.6)
WBC: 3.1 10*3/uL — ABNORMAL LOW (ref 4.0–10.3)
lymph#: 1 10*3/uL (ref 0.9–3.3)

## 2014-11-25 MED ORDER — DARBEPOETIN ALFA 300 MCG/0.6ML IJ SOSY
300.0000 ug | PREFILLED_SYRINGE | Freq: Once | INTRAMUSCULAR | Status: AC
  Administered 2014-11-25: 300 ug via SUBCUTANEOUS
  Filled 2014-11-25: qty 0.6

## 2014-12-08 ENCOUNTER — Ambulatory Visit: Payer: Self-pay | Admitting: Neurology

## 2014-12-09 ENCOUNTER — Encounter: Payer: Self-pay | Admitting: Gastroenterology

## 2014-12-11 ENCOUNTER — Telehealth: Payer: Self-pay | Admitting: *Deleted

## 2014-12-11 NOTE — Telephone Encounter (Signed)
Spoke with pt and wife and PV for 12-15-14 cancelled.  OV with J Zehr made for 12-26-14 at 9:30- this was the first time that worked for pt.  I didn't cancel colonoscopy on 12-31-14 in case pt could keep it.

## 2014-12-11 NOTE — Telephone Encounter (Signed)
Dr. Fuller Plan, This pt is coming for a PV on 12-15-14 and his colonoscopy is 12-31-14. He does have a history of polyps. He does have a complicated medical history- cancer of epiglottis, seizures (his last one was February 2016), SOB on exertion, COPD, among others.  Is he ok for a direct colonoscopy or would you like an OV first?  Thanks, Cyril Mourning

## 2014-12-11 NOTE — Telephone Encounter (Signed)
Office visit please to fully assess

## 2014-12-16 ENCOUNTER — Other Ambulatory Visit (HOSPITAL_BASED_OUTPATIENT_CLINIC_OR_DEPARTMENT_OTHER): Payer: Medicare Other

## 2014-12-16 ENCOUNTER — Ambulatory Visit (HOSPITAL_BASED_OUTPATIENT_CLINIC_OR_DEPARTMENT_OTHER): Payer: Medicare Other

## 2014-12-16 VITALS — BP 138/81 | HR 66 | Temp 98.5°F

## 2014-12-16 DIAGNOSIS — D4620 Refractory anemia with excess of blasts, unspecified: Secondary | ICD-10-CM

## 2014-12-16 DIAGNOSIS — C321 Malignant neoplasm of supraglottis: Secondary | ICD-10-CM

## 2014-12-16 LAB — CBC WITH DIFFERENTIAL/PLATELET
BASO%: 1.8 % (ref 0.0–2.0)
Basophils Absolute: 0 10*3/uL (ref 0.0–0.1)
EOS ABS: 0.1 10*3/uL (ref 0.0–0.5)
EOS%: 2 % (ref 0.0–7.0)
HCT: 29.8 % — ABNORMAL LOW (ref 38.4–49.9)
HGB: 9.8 g/dL — ABNORMAL LOW (ref 13.0–17.1)
LYMPH%: 34.1 % (ref 14.0–49.0)
MCH: 37.5 pg — ABNORMAL HIGH (ref 27.2–33.4)
MCHC: 32.8 g/dL (ref 32.0–36.0)
MCV: 114.3 fL — ABNORMAL HIGH (ref 79.3–98.0)
MONO#: 0.3 10*3/uL (ref 0.1–0.9)
MONO%: 11.2 % (ref 0.0–14.0)
NEUT#: 1.4 10*3/uL — ABNORMAL LOW (ref 1.5–6.5)
NEUT%: 50.9 % (ref 39.0–75.0)
Platelets: 221 10*3/uL (ref 140–400)
RBC: 2.61 10*6/uL — AB (ref 4.20–5.82)
RDW: 17.2 % — AB (ref 11.0–14.6)
WBC: 2.7 10*3/uL — AB (ref 4.0–10.3)
lymph#: 0.9 10*3/uL (ref 0.9–3.3)

## 2014-12-16 MED ORDER — DARBEPOETIN ALFA 300 MCG/0.6ML IJ SOSY
300.0000 ug | PREFILLED_SYRINGE | Freq: Once | INTRAMUSCULAR | Status: AC
  Administered 2014-12-16: 300 ug via SUBCUTANEOUS
  Filled 2014-12-16: qty 0.6

## 2014-12-21 ENCOUNTER — Other Ambulatory Visit: Payer: Self-pay | Admitting: Neurology

## 2014-12-26 ENCOUNTER — Ambulatory Visit (INDEPENDENT_AMBULATORY_CARE_PROVIDER_SITE_OTHER): Payer: Medicare Other | Admitting: Gastroenterology

## 2014-12-26 ENCOUNTER — Encounter: Payer: Self-pay | Admitting: Gastroenterology

## 2014-12-26 VITALS — BP 102/72 | HR 66 | Ht 72.0 in | Wt 114.6 lb

## 2014-12-26 DIAGNOSIS — D126 Benign neoplasm of colon, unspecified: Secondary | ICD-10-CM | POA: Diagnosis not present

## 2014-12-26 MED ORDER — NA SULFATE-K SULFATE-MG SULF 17.5-3.13-1.6 GM/177ML PO SOLN: 1.0000 | Freq: Once | ORAL | Status: DC

## 2014-12-26 NOTE — Patient Instructions (Signed)
You have been scheduled for a colonoscopy. Please follow written instructions given to you at your visit today.  Please pick up your prep supplies at the pharmacy within the next 1-3 days. If you use inhalers (even only as needed), please bring them with you on the day of your procedure.   

## 2014-12-26 NOTE — Progress Notes (Signed)
12/26/2014 Adrian [name] [id number] July 10, 1950   HISTORY OF PRESENT ILLNESS:  This is a 64 year old male who is previously known to Dr. Fuller Plan for colonoscopy in 08/2009 at which time he was found to have 2 polyps (tubular adenomas) and internal hemorrhoids.  Repeat colonoscopy was recommended in 5 years from that time.  He is here today to schedule his surveillance colonoscopy.  Denies any GI complaints.  Since his last colonoscopy he's been diagnosed with megaloblastic anemia, which they think is from his long-standing Dilantin, which he takes for his seizures.  Also was diagnosed with and treated for squamous cell cancer of the larynx/epiglottis in 2014.  Had a temporary PEG and trach, but those are gone now.  He is eating well, no issues with swallowing, speech, etc.  Past Medical History  Diagnosis Date  . Megaloblastic anemia   . Emphysema of lung   . Arthritis     rheumatoid  . Esophageal reflux   . Anxiety   . Shortness of breath     with exertion  . Pneumonia 01/2013  . Seizures     last one was 8 years ago  . History of blood transfusion   . Cancer 03/11/2013    larnyx/epiglottic Squamous cell in situ  . Throat pain 06/13/2013  . History of radiation therapy 05/13/2013-06/28/2013    70 gray to epiglottis/neck   Past Surgical History  Procedure Laterality Date  . Abdominal hernia repair    . Tracheostomy  2014  . Peg placement  2014  . Colonoscopy    . Multiple extractions with alveoloplasty N/A 04/16/2013    Procedure: Extraction of tooth #'s 2,5,6,7,10,11,15,17,21,22,27,29 with alveoloplasty, bilateral maxillary fibrous tuberosity reductions, removal of excess tissues of mandibular arch, and mandibular left lingual exostoses reductions.;  Surgeon: Lenn Cal, DDS;  Location: [place];  Service: Oral Surgery;  Laterality: N/A;    reports that he quit smoking about 20 months ago. His smoking use included Cigarettes. He has a 40 pack-year smoking history. He has never  used smokeless tobacco. He reports that he does not drink alcohol or use illicit drugs. family history includes Arthritis/Rheumatoid in his mother; Hypertension in his mother; Stroke in his mother. No Known Allergies    Outpatient Encounter Prescriptions as of 12/26/2014  Medication Sig  . cyanocobalamin 500 MCG tablet Take 500 mcg by mouth daily.  . diazepam (VALIUM) 5 MG tablet Take 10 mg by mouth every 6 (six) hours as needed for anxiety.   . ferrous sulfate 325 (65 FE) MG tablet Take 325 mg by mouth daily with breakfast.  . folic acid (FOLVITE) 1 MG tablet Take 1 mg by mouth daily.  Marland Kitchen levETIRAcetam (KEPPRA) 500 MG tablet Take 1 tablet (500 mg total) by mouth 2 (two) times daily.  . mometasone (NASONEX) 50 MCG/ACT nasal spray Place 2 sprays into both nostrils daily.  . vitamin E 1000 UNIT capsule Take 1,000 Units by mouth daily.   . Na Sulfate-K Sulfate-Mg Sulf SOLN Take 1 kit by mouth once.   No facility-administered encounter medications on file as of 12/26/2014.     REVIEW OF SYSTEMS  : All other systems reviewed and negative except where noted in the History of Present Illness.   PHYSICAL EXAM: BP 102/72 mmHg  Pulse 66  Ht 6' (1.829 m)  Wt 114 lb 9.6 oz (51.982 kg)  BMI 15.54 kg/m2 General:  Thin black male in no acute distress Head: Normocephalic and atraumatic Eyes:  Sclerae anicteric, conjunctiva pink. Ears: Normal auditory acuity Lungs: Clear throughout to auscultation Heart: Regular rate and rhythm Abdomen: Soft, non-distended.  Normal bowel sounds.  Non-tender. Rectal:  Will be done at the time of colonoscopy. Musculoskeletal: Symmetrical with no gross deformities  Skin: No lesions on visible extremities Extremities: No edema  Neurological: Alert oriented x 4, grossly non-focal Psychological:  Alert and cooperative. Normal mood and affect  ASSESSMENT AND PLAN: -Personal history of colon polyps:  Adenomas in 08/2009.  Will schedule for surveillance colonoscopy  with Dr. Fuller Plan.  The risks, benefits, and alternatives to colonoscopy were discussed with the patient and he consents to proceed.         CC:  [name], MD

## 2014-12-26 NOTE — Progress Notes (Signed)
Reviewed and agree with management plan.  Arnav Cregg T. Shene Maxfield, MD FACG 

## 2014-12-31 ENCOUNTER — Ambulatory Visit (AMBULATORY_SURGERY_CENTER): Payer: Medicare Other | Admitting: Gastroenterology

## 2014-12-31 ENCOUNTER — Encounter: Payer: Self-pay | Admitting: Gastroenterology

## 2014-12-31 VITALS — BP 128/69 | HR 54 | Temp 97.5°F | Resp 20 | Ht 72.0 in | Wt 114.0 lb

## 2014-12-31 DIAGNOSIS — Z86010 Personal history of colonic polyps: Secondary | ICD-10-CM | POA: Diagnosis present

## 2014-12-31 DIAGNOSIS — D123 Benign neoplasm of transverse colon: Secondary | ICD-10-CM

## 2014-12-31 MED ORDER — SODIUM CHLORIDE 0.9 % IV SOLN: 500.0000 mL | INTRAVENOUS | Status: DC

## 2014-12-31 NOTE — Progress Notes (Signed)
Called to room to assist during endoscopic procedure.  Patient ID and intended procedure confirmed with present staff. Received instructions for my participation in the procedure from the performing physician.  

## 2014-12-31 NOTE — Progress Notes (Signed)
Transferred to recovery room. A/O x3, pleased with MAC.  VSS.  Report to Jane, RN. 

## 2014-12-31 NOTE — Patient Instructions (Signed)
YOU HAD AN ENDOSCOPIC PROCEDURE TODAY AT [facility]:   Refer to the procedure report that was given to you for any specific questions about what was found during the examination.  If the procedure report does not answer your questions, please call your gastroenterologist to clarify.  If you requested that your care partner not be given the details of your procedure findings, then the procedure report has been included in a sealed envelope for you to review at your convenience later.  YOU SHOULD EXPECT: Some feelings of bloating in the abdomen. Passage of more gas than usual.  Walking can help get rid of the air that was put into your GI tract during the procedure and reduce the bloating. If you had a lower endoscopy (such as a colonoscopy or flexible sigmoidoscopy) you may notice spotting of blood in your stool or on the toilet paper. If you underwent a bowel prep for your procedure, you may not have a normal bowel movement for a few days.  Please Note:  You might notice some irritation and congestion in your nose or some drainage.  This is from the oxygen used during your procedure.  There is no need for concern and it should clear up in a day or so.  SYMPTOMS TO REPORT IMMEDIATELY:   Following lower endoscopy (colonoscopy or flexible sigmoidoscopy):  Excessive amounts of blood in the stool  Significant tenderness or worsening of abdominal pains  Swelling of the abdomen that is new, acute  Fever of 100F or higher    For urgent or emergent issues, a gastroenterologist can be reached at any hour by calling [phone].   DIET: Your first meal following the procedure should be a small meal and then it is ok to progress to your normal diet. Heavy or fried foods are harder to digest and may make you feel nauseous or bloated.  Likewise, meals heavy in dairy and vegetables can increase bloating.  Drink plenty of fluids but you should avoid alcoholic beverages for 24  hours.  ACTIVITY:  You should plan to take it easy for the rest of today and you should NOT DRIVE or use heavy machinery until tomorrow (because of the sedation medicines used during the test).    FOLLOW UP: Our staff will call the number listed on your records the next business day following your procedure to check on you and address any questions or concerns that you may have regarding the information given to you following your procedure. If we do not reach you, we will leave a message.  However, if you are feeling well and you are not experiencing any problems, there is no need to return our call.  We will assume that you have returned to your regular daily activities without incident.  If any biopsies were taken you will be contacted by phone or by letter within the next 1-3 weeks.  Please call us at [phone] if you have not heard about the biopsies in 3 weeks.    SIGNATURES/CONFIDENTIALITY: You and/or your care partner have signed paperwork which will be entered into your electronic medical record.  These signatures attest to the fact that that the information above on your After Visit Summary has been reviewed and is understood.  Full responsibility of the confidentiality of this discharge information lies with you and/or your care-partner.  Polyp information given. Repeat coiloinoscopy in 5 years-2021

## 2014-12-31 NOTE — Op Note (Signed)
[facility]  [address] [place], [id number]   COLONOSCOPY PROCEDURE REPORT  PATIENT: Adrian [name], Adrian [name]  MR#: [id number] BIRTHDATE: 1950/07/26 , 22  yrs. old GENDER: male ENDOSCOPIST: Ladene Artist, MD, [facility] PROCEDURE DATE:  12/31/2014 PROCEDURE:   Colonoscopy, surveillance and Colonoscopy with snare polypectomy First Screening Colonoscopy - Avg.  risk and is 50 yrs.  old or older - No.  Prior Negative Screening - Now for repeat screening. N/A  History of Adenoma - Now for follow-up colonoscopy & has been > or = to 3 yrs.  Yes hx of adenoma.  Has been 3 or more years since last colonoscopy.  Polyps removed today? Yes ASA CLASS:   Class II INDICATIONS:Surveillance due to prior colonic neoplasia and PH Colon Adenoma. MEDICATIONS: Monitored anesthesia care and Propofol 150 mg IV DESCRIPTION OF PROCEDURE:   After the risks benefits and alternatives of the procedure were thoroughly explained, informed consent was obtained.  The digital rectal exam revealed no abnormalities of the rectum.   The LB [id number]  endoscope was introduced through the anus and advanced to the cecum, which was identified by both the appendix and ileocecal valve. No adverse events experienced.   The quality of the prep was good.  (Suprep was used)  The instrument was then slowly withdrawn as the colon was fully examined. Estimated blood loss is zero unless otherwise noted in this procedure report.    COLON FINDINGS: A sessile polyp measuring 6 mm in size was found in the transverse colon.  A polypectomy was performed with a cold snare.  The resection was complete, the polyp tissue was completely retrieved and sent to histology.   The examination was otherwise normal.  Retroflexed views revealed no abnormalities. The time to cecum = 3.7 Withdrawal time = 11.0   The scope was withdrawn and the procedure completed. COMPLICATIONS: There were no immediate complications.  ENDOSCOPIC  IMPRESSION: 1.   Sessile polyp in the transverse colon; polypectomy performed with a cold snare 2.   The examination was otherwise normal  RECOMMENDATIONS: 1.  Await pathology results 2.  Repeat Colonoscopy in 5 years.  eSigned:  Ladene Artist, MD, [facility] 12/31/2014 9:26 AM   cc: [name], MD

## 2015-01-01 ENCOUNTER — Telehealth: Payer: Self-pay

## 2015-01-01 NOTE — Telephone Encounter (Signed)
No answer, left voicemail message.

## 2015-01-06 ENCOUNTER — Encounter: Payer: Self-pay | Admitting: Gastroenterology

## 2015-01-06 ENCOUNTER — Ambulatory Visit (HOSPITAL_BASED_OUTPATIENT_CLINIC_OR_DEPARTMENT_OTHER): Payer: Medicare Other

## 2015-01-06 ENCOUNTER — Other Ambulatory Visit (HOSPITAL_BASED_OUTPATIENT_CLINIC_OR_DEPARTMENT_OTHER): Payer: Medicare Other

## 2015-01-06 VITALS — BP 139/63 | HR 72 | Temp 98.5°F

## 2015-01-06 DIAGNOSIS — D4620 Refractory anemia with excess of blasts, unspecified: Secondary | ICD-10-CM

## 2015-01-06 DIAGNOSIS — C321 Malignant neoplasm of supraglottis: Secondary | ICD-10-CM

## 2015-01-06 LAB — CBC WITH DIFFERENTIAL/PLATELET
BASO%: 0.9 % (ref 0.0–2.0)
Basophils Absolute: 0 10*3/uL (ref 0.0–0.1)
EOS%: 1.4 % (ref 0.0–7.0)
Eosinophils Absolute: 0 10*3/uL (ref 0.0–0.5)
HEMATOCRIT: 28.8 % — AB (ref 38.4–49.9)
HGB: 9.3 g/dL — ABNORMAL LOW (ref 13.0–17.1)
LYMPH#: 0.7 10*3/uL — AB (ref 0.9–3.3)
LYMPH%: 32.1 % (ref 14.0–49.0)
MCH: 36.3 pg — ABNORMAL HIGH (ref 27.2–33.4)
MCHC: 32.3 g/dL (ref 32.0–36.0)
MCV: 112.5 fL — ABNORMAL HIGH (ref 79.3–98.0)
MONO#: 0.2 10*3/uL (ref 0.1–0.9)
MONO%: 11.3 % (ref 0.0–14.0)
NEUT%: 54.3 % (ref 39.0–75.0)
NEUTROS ABS: 1.2 10*3/uL — AB (ref 1.5–6.5)
PLATELETS: 205 10*3/uL (ref 140–400)
RBC: 2.56 10*6/uL — ABNORMAL LOW (ref 4.20–5.82)
RDW: 16.2 % — AB (ref 11.0–14.6)
WBC: 2.1 10*3/uL — AB (ref 4.0–10.3)

## 2015-01-06 MED ORDER — DARBEPOETIN ALFA 300 MCG/0.6ML IJ SOSY
300.0000 ug | PREFILLED_SYRINGE | Freq: Once | INTRAMUSCULAR | Status: AC
  Administered 2015-01-06: 300 ug via SUBCUTANEOUS
  Filled 2015-01-06: qty 0.6

## 2015-01-07 ENCOUNTER — Encounter: Payer: Self-pay | Admitting: Neurology

## 2015-01-07 ENCOUNTER — Ambulatory Visit (INDEPENDENT_AMBULATORY_CARE_PROVIDER_SITE_OTHER): Payer: Medicare Other | Admitting: Neurology

## 2015-01-07 VITALS — BP 117/76 | HR 77 | Ht 72.0 in | Wt 114.6 lb

## 2015-01-07 DIAGNOSIS — R569 Unspecified convulsions: Secondary | ICD-10-CM | POA: Diagnosis not present

## 2015-01-07 NOTE — Patient Instructions (Signed)
I had a long discussion with the patient and his wife regarding his seizures which now seem well controlled on current dose of Keppra 500 twice daily which is tolerating well without significant side effects. I encouraged him to be compliant with his medications. His mild cognitive impairment and memory difficulties also appear to be stable. I encouraged him to start taking fish oil 1 capsule daily as well as participate in mentally challenging activities like solving crossword puzzles, sudoku or playing bridge. He was asked to return for follow-up in a year or call earlier if necessary. Memory Compensation Strategies  1. Use "WARM" strategy.  W= write it down  A= associate it  R= repeat it  M= make a mental note  2.   You can keep a Social worker.  Use a 3-ring notebook with sections for the following: calendar, important names and phone numbers,  medications, doctors' names/phone numbers, lists/reminders, and a section to journal what you did  each day.   3.    Use a calendar to write appointments down.  4.    Write yourself a schedule for the day.  This can be placed on the calendar or in a separate section of the Memory Notebook.  Keeping a  regular schedule can help memory.  5.    Use medication organizer with sections for each day or morning/evening pills.  You may need help loading it  6.    Keep a basket, or pegboard by the door.  Place items that you need to take out with you in the basket or on the pegboard.  You may also want to  include a message board for reminders.  7.    Use sticky notes.  Place sticky notes with reminders in a place where the task is performed.  For example: " turn off the  stove" placed by the stove, "lock the door" placed on the door at eye level, " take your medications" on  the bathroom mirror or by the place where you normally take your medications.  8.    Use alarms/timers.  Use while cooking to remind yourself to check on food or as a reminder to  take your medicine, or as a  reminder to make a call, or as a reminder to perform another task, etc.

## 2015-01-07 NOTE — Progress Notes (Signed)
[facility] [address] [place]. [place] [address] [phone]       OFFICE FOLLOW UP VISIT NOTE  [name] Date of Birth:  01/25/1951 Medical Record Number:  [id number]   Referring MD:  [name]  Reason for Referral:  seizure  HPI: 64 year male with history of seizures since 30 years which were apparently called alcohol related either due to intoxication or withdrawal. He has been on Dilantin since a long time and he could tell of previous stroke. He has myelodysplastic syndrome with anemia. It is suspected that chronic Dilantin usage may have contributed to his bone marrow suppression fan primary care physician has raised the question as to whether he can come off anticonvulsants since he has been seizure-free for a long time. He denies history of head injury, loss of consciousness, febrile illness for unprovoked seizures. He does not remember any unprovoked seizures which was nonl alcohol related. There is no family history of seizures or epilepsy. UPDATE 03/05/2014 : he returns for followup of her last visit 4 months ago. He has reduced her Dilantin to 300 mg daily without any breakthrough seizures. He continues to have a mild short term memory difficulties which appear to be unchanged. He still independent in most activities of daily living except he does not drive. He has not had any new neurological complaints. He underwent lab work on 12/25/13 which showed vitamin B12, TSH to be normal and RPR to be nonreactive. MRI scan of the brain personally reviewed by me done on 01/30/14 shows moderate changes of generalized cerebral atrophy and mild changes of chronic microvascular ischemia and paranasal sinusitis. EEG done on 12/25/13 personally reviewed by me is normal without any epileptiform features . Update 07/09/2014 ; he returns for follow-up after last visit 2 months ago. He is accompanied by his wife and stated that he had a generalized tonic-clonic seizure 2  weeks ago when he was on a tapering dose of Dilantin of 100 mg daily. He was seen in the ER where he was loaded with Dilantin but subsequently Dr. Jannifer Franklin changed him to Ebro 500 twice daily as the plan was to discontinue Dilantin since hematologist felt that it was contributing to his myelodysplasia. Patient has noted some irritable mood and has not felt as alert and wife feels this may be effect of Keppra. However he has been noted for less than 2 weeks. He feels his memory difficulties are unchanged. He scored 23/30 on the Mini-Mental today. Update 01/07/2015 : He returns for follow-up after last visit 6 months ago accompanied by his wife. He is doing well without recurrent seizures now. He is tolerating Keppra better and his irritability and mood changes at the beginning have now settled down. He has had no breakthrough seizures. He had EEG done on 07/16/2014 which I personally reviewed and was normal. He states his memory and come to difficulties remain unchanged. He is independent in activities of daily living and does not need any help at home. He has no new complaints. He is not part sparing in any cognitively challenging activities. ROS:   14 system review of systems is positive for memory loss seizure,   and all of the systems negative  PMH:  Past Medical History  Diagnosis Date  . Megaloblastic anemia   . Emphysema of lung   . Arthritis     rheumatoid  . Esophageal reflux   . Anxiety   . Shortness of breath     with  exertion  . Pneumonia 01/2013  . Seizures     last one was 8 years ago  . History of blood transfusion   . Cancer 03/11/2013    larnyx/epiglottic Squamous cell in situ  . Throat pain 06/13/2013  . History of radiation therapy 05/13/2013-06/28/2013    70 gray to epiglottis/neck    Social History:  Social History   Social History  . Marital Status: Married    Spouse Name: Hassan Rowan  . Number of Children: 3  . Years of Education: 12th   Occupational History  . Retired  Other   Social History Main Topics  . Smoking status: Former Smoker -- 1.00 packs/day for 40 years    Types: Cigarettes    Quit date: 04/15/2013  . Smokeless tobacco: Never Used     Comment: patient states he is slowly quitting on his own - does not want assistance  . Alcohol Use: No  . Drug Use: No  . Sexual Activity: Not on file   Other Topics Concern  . Not on file   Social History Narrative   04/12/2013   The patient is married with 3 children. (2 girls and one boy)    The patient has a history of smoking a quarter pack per day for 15 years.   The patient is trying to quit on his own.   Patient has not had any alcohol for the past 6 months by report. Patient usually drinks vodka.   Caffeine Use: 2 cups daily             Medications:   Current Outpatient Prescriptions on File Prior to Visit  Medication Sig Dispense Refill  . cyanocobalamin 500 MCG tablet Take 500 mcg by mouth daily.    . diazepam (VALIUM) 5 MG tablet Take 10 mg by mouth every 6 (six) hours as needed for anxiety.     . ferrous sulfate 325 (65 FE) MG tablet Take 325 mg by mouth daily with breakfast.    . folic acid (FOLVITE) 1 MG tablet Take 1 mg by mouth daily.    Marland Kitchen levETIRAcetam (KEPPRA) 500 MG tablet Take 1 tablet (500 mg total) by mouth 2 (two) times daily. 60 tablet 0  . mometasone (NASONEX) 50 MCG/ACT nasal spray Place 2 sprays into both nostrils daily.    . vitamin E 1000 UNIT capsule Take 1,000 Units by mouth daily.      No current facility-administered medications on file prior to visit.    Allergies:  No Known Allergies  Physical Exam General: well developed, well nourished middle-age African-American male, seated, in no evident distress Head: head normocephalic and atraumatic.     Neck: supple with no carotid or supraclavicular bruits Cardiovascular: regular rate and rhythm, no murmurs Musculoskeletal: no deformity Skin:  no rash/petichiae. Chronic fungus infection left hand fingernails  with dystrophic nails Vascular:  Normal pulses all extremities Filed Vitals:   01/07/15 1004  BP: 117/76  Pulse: 77    Neurologic Exam Mental Status: Awake and fully alert. Oriented to place and time. Recent and remote memory intact. Attention span, concentration and fund of knowledge appropriate. Mood and affect appropriate. Mini-Mental status exam not done today Cranial Nerves: Fundoscopic exam not done . Pupils equal, briskly reactive to light. Extraocular movements full without nystagmus. Visual fields full to confrontation. Hearing intact. Facial sensation intact. Face, tongue, palate moves normally and symmetrically.  Motor: Normal bulk and tone. Normal strength in all tested extremity muscles. Sensory.: intact to tough and pinprick  and vibratory.  Coordination: Rapid alternating movements normal in all extremities. Finger-to-nose and heel-to-shin performed accurately bilaterally. Gait and Station: Arises from chair without difficulty. Stance is normal. Gait demonstrates normal stride length and balance . Able to heel, toe and tandem walk without difficulty.  Reflexes: 1+ and symmetric. Toes downgoing.    ASSESSMENT: 28 year remote history of seizures since 30 years has been seizure-free since 2008. Most of the seizures appear to have been alcohol related.He also has a mild cognitive impairment which appears stable    PLAN: I had a long discussion with the patient and his wife regarding his seizures which now seem well controlled on current dose of Keppra 500 twice daily which is tolerating well without significant side effects. I encouraged him to be compliant with his medications. His mild cognitive impairment and memory difficulties also appear to be stable. I encouraged him to start taking fish oil 1 capsule daily as well as participate in mentally challenging activities like solving crossword puzzles, sudoku or playing bridge. Greater than 50% time during this 25 minute visit was  spent on counseling and coordination of care. He was asked to return for follow-up in a year or call earlier if necessary.   Antony Contras, MD Note: This document was prepared with digital dictation and possible smart phrase technology. Any transcriptional errors that result from this process are unintentional.

## 2015-01-18 ENCOUNTER — Other Ambulatory Visit: Payer: Self-pay | Admitting: Neurology

## 2015-01-27 ENCOUNTER — Other Ambulatory Visit (HOSPITAL_BASED_OUTPATIENT_CLINIC_OR_DEPARTMENT_OTHER): Payer: Medicare Other

## 2015-01-27 ENCOUNTER — Ambulatory Visit (HOSPITAL_BASED_OUTPATIENT_CLINIC_OR_DEPARTMENT_OTHER): Payer: Medicare Other

## 2015-01-27 VITALS — BP 111/71 | HR 80 | Temp 98.7°F

## 2015-01-27 DIAGNOSIS — C321 Malignant neoplasm of supraglottis: Secondary | ICD-10-CM

## 2015-01-27 DIAGNOSIS — D4620 Refractory anemia with excess of blasts, unspecified: Secondary | ICD-10-CM

## 2015-01-27 LAB — CBC WITH DIFFERENTIAL/PLATELET
BASO%: 1.9 % (ref 0.0–2.0)
Basophils Absolute: 0 10*3/uL (ref 0.0–0.1)
EOS%: 2.8 % (ref 0.0–7.0)
Eosinophils Absolute: 0.1 10*3/uL (ref 0.0–0.5)
HCT: 29.4 % — ABNORMAL LOW (ref 38.4–49.9)
HEMOGLOBIN: 9.5 g/dL — AB (ref 13.0–17.1)
LYMPH%: 29.6 % (ref 14.0–49.0)
MCH: 36.3 pg — AB (ref 27.2–33.4)
MCHC: 32.3 g/dL (ref 32.0–36.0)
MCV: 112.2 fL — ABNORMAL HIGH (ref 79.3–98.0)
MONO#: 0.2 10*3/uL (ref 0.1–0.9)
MONO%: 10.2 % (ref 0.0–14.0)
NEUT%: 55.5 % (ref 39.0–75.0)
NEUTROS ABS: 1.2 10*3/uL — AB (ref 1.5–6.5)
Platelets: 227 10*3/uL (ref 140–400)
RBC: 2.62 10*6/uL — ABNORMAL LOW (ref 4.20–5.82)
RDW: 16.6 % — AB (ref 11.0–14.6)
WBC: 2.2 10*3/uL — AB (ref 4.0–10.3)
lymph#: 0.6 10*3/uL — ABNORMAL LOW (ref 0.9–3.3)

## 2015-01-27 MED ORDER — DARBEPOETIN ALFA 300 MCG/0.6ML IJ SOSY
300.0000 ug | PREFILLED_SYRINGE | Freq: Once | INTRAMUSCULAR | Status: AC
  Administered 2015-01-27: 300 ug via SUBCUTANEOUS
  Filled 2015-01-27: qty 0.6

## 2015-02-17 ENCOUNTER — Ambulatory Visit (HOSPITAL_BASED_OUTPATIENT_CLINIC_OR_DEPARTMENT_OTHER): Payer: Medicare Other | Admitting: Hematology and Oncology

## 2015-02-17 ENCOUNTER — Other Ambulatory Visit (HOSPITAL_BASED_OUTPATIENT_CLINIC_OR_DEPARTMENT_OTHER): Payer: Medicare Other

## 2015-02-17 ENCOUNTER — Encounter: Payer: Self-pay | Admitting: Hematology and Oncology

## 2015-02-17 ENCOUNTER — Ambulatory Visit (HOSPITAL_BASED_OUTPATIENT_CLINIC_OR_DEPARTMENT_OTHER): Payer: Medicare Other

## 2015-02-17 ENCOUNTER — Telehealth: Payer: Self-pay | Admitting: Hematology and Oncology

## 2015-02-17 ENCOUNTER — Other Ambulatory Visit: Payer: Self-pay

## 2015-02-17 ENCOUNTER — Ambulatory Visit: Payer: Self-pay

## 2015-02-17 VITALS — BP 133/64 | HR 81 | Temp 97.9°F | Resp 20 | Ht 73.0 in | Wt 117.8 lb

## 2015-02-17 DIAGNOSIS — D4620 Refractory anemia with excess of blasts, unspecified: Secondary | ICD-10-CM | POA: Diagnosis not present

## 2015-02-17 DIAGNOSIS — Z720 Tobacco use: Secondary | ICD-10-CM

## 2015-02-17 DIAGNOSIS — C321 Malignant neoplasm of supraglottis: Secondary | ICD-10-CM

## 2015-02-17 LAB — CBC WITH DIFFERENTIAL/PLATELET
BASO%: 0.6 % (ref 0.0–2.0)
BASOS ABS: 0 10*3/uL (ref 0.0–0.1)
EOS%: 0.9 % (ref 0.0–7.0)
Eosinophils Absolute: 0 10*3/uL (ref 0.0–0.5)
HCT: 29.8 % — ABNORMAL LOW (ref 38.4–49.9)
HGB: 9.7 g/dL — ABNORMAL LOW (ref 13.0–17.1)
LYMPH%: 21.8 % (ref 14.0–49.0)
MCH: 36.6 pg — AB (ref 27.2–33.4)
MCHC: 32.6 g/dL (ref 32.0–36.0)
MCV: 112.5 fL — ABNORMAL HIGH (ref 79.3–98.0)
MONO#: 0.4 10*3/uL (ref 0.1–0.9)
MONO%: 10.8 % (ref 0.0–14.0)
NEUT#: 2.1 10*3/uL (ref 1.5–6.5)
NEUT%: 65.9 % (ref 39.0–75.0)
Platelets: 203 10*3/uL (ref 140–400)
RBC: 2.65 10*6/uL — AB (ref 4.20–5.82)
RDW: 16.6 % — ABNORMAL HIGH (ref 11.0–14.6)
WBC: 3.3 10*3/uL — ABNORMAL LOW (ref 4.0–10.3)
lymph#: 0.7 10*3/uL — ABNORMAL LOW (ref 0.9–3.3)

## 2015-02-17 MED ORDER — DARBEPOETIN ALFA 300 MCG/0.6ML IJ SOSY
300.0000 ug | PREFILLED_SYRINGE | Freq: Once | INTRAMUSCULAR | Status: AC
  Administered 2015-02-17: 300 ug via SUBCUTANEOUS
  Filled 2015-02-17: qty 0.6

## 2015-02-17 NOTE — Progress Notes (Signed)
[facility] OFFICE PROGRESS NOTE  Patient Care Team: [name], MD as PCP - General (Family Medicine) [name], MD as Consulting Physician (Hematology and Oncology) Leota Sauers, RN as Registered Nurse (Oncology)  SUMMARY OF ONCOLOGIC HISTORY: Oncology History   MDS, R-IPSS score of 3, low risk (Hg 8.9, +16 chromosome on BM, 2% blast count)   Squamous cell carcinoma of the epiglottis   Primary site: Larynx - Supraglottis (Left)   Staging method: AJCC 7th Edition   Clinical: Stage I (T1, N0, M0) signed by [name], MD on 04/30/2013 12:49 PM   Pathologic: Stage I (T1, N0, cM0) signed by [name], MD on 04/30/2013 12:49 PM   Summary: Stage I (T1, N0, cM0)       MDS (myelodysplastic syndrome), low grade ([place])   02/01/2007 Bone Marrow Biopsy BM biopsy was abnormal, overall probable low grade MDS   03/23/2011 - 02/06/2013 Chemotherapy [name], discontinued due to diagnosis of laryngeal ca   09/06/2013 Bone Marrow Biopsy Repeat bone marrow aspirate and biopsy confirmed low-grade myelodysplastic syndrome.   09/17/2013 -  Chemotherapy The patient resumed darbepoetin injections to treat the anemia.    Squamous cell carcinoma of the epiglottis   03/06/2013 Procedure Biopsy from epiglottic region showed invasive [facility]   04/25/2013 Imaging Ct scan showed no other involvement in the LN   05/13/2013 - 06/28/2013 Radiation Therapy He received radiation therapy, 70 gray in 35 fractions to the larynx    INTERVAL HISTORY: Please see below for problem oriented charting.  He returns for further follow-up. He denies hoarseness, abnormal lymphadenopathy of the neck or recent infection.  he is still smoking 1-2 cigarettes per day and is attempting to quit.  He declined influenza vaccination  REVIEW OF SYSTEMS:   Constitutional: Denies fevers, chills or abnormal weight loss Eyes: Denies blurriness of vision Ears, nose, mouth, throat, and face: Denies mucositis or  sore throat Respiratory: Denies cough, dyspnea or wheezes Cardiovascular: Denies palpitation, chest discomfort or lower extremity swelling Gastrointestinal:  Denies nausea, heartburn or change in bowel habits Skin: Denies abnormal skin rashes Lymphatics: Denies new lymphadenopathy or easy bruising Neurological:Denies numbness, tingling or new weaknesses Behavioral/Psych: Mood is stable, no new changes  All other systems were reviewed with the patient and are negative.  I have reviewed the past medical history, past surgical history, social history and family history with the patient and they are unchanged from previous note.  ALLERGIES:  has No Known Allergies.  MEDICATIONS:  Current Outpatient Prescriptions  Medication Sig Dispense Refill  . cyanocobalamin 500 MCG tablet Take 500 mcg by mouth daily.    . diazepam (VALIUM) 5 MG tablet Take 10 mg by mouth every 6 (six) hours as needed for anxiety.     . ferrous sulfate 325 (65 FE) MG tablet Take 325 mg by mouth daily with breakfast.    . folic acid (FOLVITE) 1 MG tablet Take 1 mg by mouth daily.    Marland Kitchen levETIRAcetam (KEPPRA) 500 MG tablet TAKE 1 TABLET (500 MG TOTAL) BY MOUTH 2 (TWO) TIMES DAILY. 60 tablet 11  . mometasone (NASONEX) 50 MCG/ACT nasal spray Place 2 sprays into both nostrils daily.    . vitamin E 1000 UNIT capsule Take 1,000 Units by mouth daily.      No current facility-administered medications for this visit.    PHYSICAL EXAMINATION: ECOG PERFORMANCE STATUS: 0 - Asymptomatic  Filed Vitals:   02/17/15 1142  BP: 133/64  Pulse: 81  Temp:  97.9 F (36.6 C)  Resp: 20   Filed Weights   02/17/15 1142  Weight: 117 lb 12.8 oz (53.434 kg)    GENERAL:alert, no distress and comfortable SKIN: skin color, texture, turgor are normal, no rashes or significant lesions EYES: normal, Conjunctiva are pink and non-injected, sclera clear OROPHARYNX:no exudate, no erythema and lips, buccal mucosa, and tongue normal  NECK: supple,  thyroid normal size, non-tender, without nodularity LYMPH:  no palpable lymphadenopathy in the cervical, axillary or inguinal LUNGS: clear to auscultation and percussion with normal breathing effort HEART: regular rate & rhythm and no murmurs and no lower extremity edema ABDOMEN:abdomen soft, non-tender and normal bowel sounds Musculoskeletal:no cyanosis of digits and no clubbing  NEURO: alert & oriented x 3 with fluent speech, no focal motor/sensory deficits  LABORATORY DATA:  I have reviewed the data as listed    Component Value Date/Time   NA 139 06/21/2014 0521   NA 139 09/17/2013 1158   K 3.7 06/21/2014 0521   K 4.1 09/17/2013 1158   CL 106 06/21/2014 0521   CL 107 09/19/2012 1325   CO2 25 06/21/2014 0521   CO2 20* 09/17/2013 1158   GLUCOSE 105* 06/21/2014 0521   GLUCOSE 77 09/17/2013 1158   GLUCOSE 97 09/19/2012 1325   BUN 19 06/21/2014 0521   BUN 17.4 09/17/2013 1158   CREATININE 0.83 06/21/2014 0521   CREATININE 0.9 09/17/2013 1158   CALCIUM 8.6 06/21/2014 0521   CALCIUM 9.3 09/17/2013 1158   PROT 8.5* 06/21/2014 0521   PROT 8.5* 09/17/2013 1158   ALBUMIN 3.4* 06/21/2014 0521   ALBUMIN 3.5 09/17/2013 1158   AST 15 06/21/2014 0521   AST 10 09/17/2013 1158   ALT 8 06/21/2014 0521   ALT <6 09/17/2013 1158   ALKPHOS 129* 06/21/2014 0521   ALKPHOS 151* 09/17/2013 1158   BILITOT 0.3 06/21/2014 0521   BILITOT 0.23 09/17/2013 1158   GFRNONAA >90 06/21/2014 0521   GFRAA >90 06/21/2014 0521    No results found for: SPEP, UPEP  Lab Results  Component Value Date   WBC 3.3* 02/17/2015   NEUTROABS 2.1 02/17/2015   HGB 9.7* 02/17/2015   HCT 29.8* 02/17/2015   MCV 112.5* 02/17/2015   PLT 203 02/17/2015      Chemistry      Component Value Date/Time   NA 139 06/21/2014 0521   NA 139 09/17/2013 1158   K 3.7 06/21/2014 0521   K 4.1 09/17/2013 1158   CL 106 06/21/2014 0521   CL 107 09/19/2012 1325   CO2 25 06/21/2014 0521   CO2 20* 09/17/2013 1158   BUN 19  06/21/2014 0521   BUN 17.4 09/17/2013 1158   CREATININE 0.83 06/21/2014 0521   CREATININE 0.9 09/17/2013 1158      Component Value Date/Time   CALCIUM 8.6 06/21/2014 0521   CALCIUM 9.3 09/17/2013 1158   ALKPHOS 129* 06/21/2014 0521   ALKPHOS 151* 09/17/2013 1158   AST 15 06/21/2014 0521   AST 10 09/17/2013 1158   ALT 8 06/21/2014 0521   ALT <6 09/17/2013 1158   BILITOT 0.3 06/21/2014 0521   BILITOT 0.23 09/17/2013 1158     ASSESSMENT & PLAN:  Squamous cell carcinoma of the epiglottis Clinically, he has no signs of recurrence. He will continue close follow-up with ENT. I recommend the patient to quit smoking.    MDS (myelodysplastic syndrome), low grade The anemia from MDS is stable. I suspect he may have some degree of bone marrow suppression from  his antiseizure medications. He had responded well to Darbopeitin in the past with improvement in energy level. The patient will return every 3 weeks to get blood work checked and he will get injection to keep hemoglobin greater than 10 g.    Tobacco abuse I spent some time counseling the patient the importance of tobacco cessation. he is currently attempting to quit on his own     Orders Placed This Encounter  Procedures  . Vitamin B12    Standing Status: Future     Number of Occurrences:      Standing Expiration Date: 03/23/2016  . Ferritin    Standing Status: Future     Number of Occurrences:      Standing Expiration Date: 03/23/2016  . Iron and TIBC    Standing Status: Future     Number of Occurrences:      Standing Expiration Date: 03/23/2016  . Erythropoietin    Standing Status: Future     Number of Occurrences:      Standing Expiration Date: 03/23/2016   All questions were answered. The patient knows to call the clinic with any problems, questions or concerns. No barriers to learning was detected. I spent 15 minutes counseling the patient face to face. The total time spent in the appointment was 20 minutes  and more than 50% was on counseling and review of test results     [facility], Vayda Dungee, MD 02/17/2015 1:11 PM

## 2015-02-17 NOTE — Patient Instructions (Signed)
Darbepoetin Alfa injection What is this medicine? DARBEPOETIN ALFA (dar be POE e tin AL fa) helps your body make more red blood cells. It is used to treat anemia caused by chronic kidney failure and chemotherapy. This medicine may be used for other purposes; ask your health care provider or pharmacist if you have questions. What should I tell my health care provider before I take this medicine? They need to know if you have any of these conditions: -blood clotting disorders or history of blood clots -cancer patient not on chemotherapy -cystic fibrosis -heart disease, such as angina, heart failure, or a history of a heart attack -hemoglobin level of 12 g/dL or greater -high blood pressure -low levels of folate, iron, or vitamin B12 -seizures -an unusual or allergic reaction to darbepoetin, erythropoietin, albumin, hamster proteins, latex, other medicines, foods, dyes, or preservatives -pregnant or trying to get pregnant -breast-feeding How should I use this medicine? This medicine is for injection into a vein or under the skin. It is usually given by a health care professional in a hospital or clinic setting. If you get this medicine at home, you will be taught how to prepare and give this medicine. Do not shake the solution before you withdraw a dose. Use exactly as directed. Take your medicine at regular intervals. Do not take your medicine more often than directed. It is important that you put your used needles and syringes in a special sharps container. Do not put them in a trash can. If you do not have a sharps container, call your pharmacist or healthcare provider to get one. Talk to your pediatrician regarding the use of this medicine in children. While this medicine may be used in children as young as 1 year for selected conditions, precautions do apply. Overdosage: If you think you have taken too much of this medicine contact a poison control center or emergency room at once. NOTE:  This medicine is only for you. Do not share this medicine with others. What if I miss a dose? If you miss a dose, take it as soon as you can. If it is almost time for your next dose, take only that dose. Do not take double or extra doses. What may interact with this medicine? Do not take this medicine with any of the following medications: -epoetin alfa This list may not describe all possible interactions. Give your health care provider a list of all the medicines, herbs, non-prescription drugs, or dietary supplements you use. Also tell them if you smoke, drink alcohol, or use illegal drugs. Some items may interact with your medicine. What should I watch for while using this medicine? Visit your prescriber or health care professional for regular checks on your progress and for the needed blood tests and blood pressure measurements. It is especially important for the doctor to make sure your hemoglobin level is in the desired range, to limit the risk of potential side effects and to give you the best benefit. Keep all appointments for any recommended tests. Check your blood pressure as directed. Ask your doctor what your blood pressure should be and when you should contact him or her. As your body makes more red blood cells, you may need to take iron, folic acid, or vitamin B supplements. Ask your doctor or health care provider which products are right for you. If you have kidney disease continue dietary restrictions, even though this medication can make you feel better. Talk with your doctor or health care professional about the   foods you eat and the vitamins that you take. What side effects may I notice from receiving this medicine? Side effects that you should report to your doctor or health care professional as soon as possible: -allergic reactions like skin rash, itching or hives, swelling of the face, lips, or tongue -breathing problems -changes in vision -chest pain -confusion, trouble speaking  or understanding -feeling faint or lightheaded, falls -high blood pressure -muscle aches or pains -pain, swelling, warmth in the leg -rapid weight gain -severe headaches -sudden numbness or weakness of the face, arm or leg -trouble walking, dizziness, loss of balance or coordination -seizures (convulsions) -swelling of the ankles, feet, hands -unusually weak or tired Side effects that usually do not require medical attention (report to your doctor or health care professional if they continue or are bothersome): -diarrhea -fever, chills (flu-like symptoms) -headaches -nausea, vomiting -redness, stinging, or swelling at site where injected This list may not describe all possible side effects. Call your doctor for medical advice about side effects. You may report side effects to FDA at 1-800-FDA-1088. Where should I keep my medicine? Keep out of the reach of children. Store in a refrigerator between 2 and 8 degrees C (36 and 46 degrees F). Do not freeze. Do not shake. Throw away any unused portion if using a single-dose vial. Throw away any unused medicine after the expiration date. NOTE: This sheet is a summary. It may not cover all possible information. If you have questions about this medicine, talk to your doctor, pharmacist, or health care provider.    2016, Elsevier/Gold Standard. (2008-04-08 10:23:57)  

## 2015-02-17 NOTE — Telephone Encounter (Signed)
Gave adn pritned aptp sched anda vs for pt for NOV thru April 2017

## 2015-02-17 NOTE — Assessment & Plan Note (Signed)
The anemia from MDS is stable. I suspect he may have some degree of bone marrow suppression from his antiseizure medications. He had responded well to Darbopeitin in the past with improvement in energy level. The patient will return every 3 weeks to get blood work checked and he will get injection to keep hemoglobin greater than 10 g.

## 2015-02-17 NOTE — Assessment & Plan Note (Signed)
Clinically, he has no signs of recurrence. He will continue close follow-up with ENT. I recommend the patient to quit smoking. 

## 2015-02-17 NOTE — Assessment & Plan Note (Signed)
I spent some time counseling the patient the importance of tobacco cessation. he is currently attempting to quit on his own 

## 2015-03-10 ENCOUNTER — Other Ambulatory Visit (HOSPITAL_BASED_OUTPATIENT_CLINIC_OR_DEPARTMENT_OTHER): Payer: Medicare Other

## 2015-03-10 ENCOUNTER — Ambulatory Visit (HOSPITAL_BASED_OUTPATIENT_CLINIC_OR_DEPARTMENT_OTHER): Payer: Medicare Other

## 2015-03-10 VITALS — BP 128/69 | HR 80 | Temp 98.7°F

## 2015-03-10 DIAGNOSIS — D4620 Refractory anemia with excess of blasts, unspecified: Secondary | ICD-10-CM

## 2015-03-10 DIAGNOSIS — C321 Malignant neoplasm of supraglottis: Secondary | ICD-10-CM

## 2015-03-10 LAB — CBC WITH DIFFERENTIAL/PLATELET
BASO%: 1.8 % (ref 0.0–2.0)
BASOS ABS: 0 10*3/uL (ref 0.0–0.1)
EOS%: 2.7 % (ref 0.0–7.0)
Eosinophils Absolute: 0.1 10*3/uL (ref 0.0–0.5)
HEMATOCRIT: 29 % — AB (ref 38.4–49.9)
HGB: 9.4 g/dL — ABNORMAL LOW (ref 13.0–17.1)
LYMPH%: 25.1 % (ref 14.0–49.0)
MCH: 37 pg — AB (ref 27.2–33.4)
MCHC: 32.3 g/dL (ref 32.0–36.0)
MCV: 114.5 fL — AB (ref 79.3–98.0)
MONO#: 0.3 10*3/uL (ref 0.1–0.9)
MONO%: 11 % (ref 0.0–14.0)
NEUT#: 1.4 10*3/uL — ABNORMAL LOW (ref 1.5–6.5)
NEUT%: 59.4 % (ref 39.0–75.0)
Platelets: 261 10*3/uL (ref 140–400)
RBC: 2.53 10*6/uL — ABNORMAL LOW (ref 4.20–5.82)
RDW: 17.8 % — ABNORMAL HIGH (ref 11.0–14.6)
WBC: 2.3 10*3/uL — ABNORMAL LOW (ref 4.0–10.3)
lymph#: 0.6 10*3/uL — ABNORMAL LOW (ref 0.9–3.3)

## 2015-03-10 LAB — IRON AND TIBC CHCC
%SAT: 45 % (ref 20–55)
Iron: 83 ug/dL (ref 42–163)
TIBC: 185 ug/dL — ABNORMAL LOW (ref 202–409)
UIBC: 102 ug/dL — ABNORMAL LOW (ref 117–376)

## 2015-03-10 LAB — FERRITIN CHCC: FERRITIN: 327 ng/mL — AB (ref 22–316)

## 2015-03-10 MED ORDER — DARBEPOETIN ALFA 300 MCG/0.6ML IJ SOSY
300.0000 ug | PREFILLED_SYRINGE | Freq: Once | INTRAMUSCULAR | Status: AC
  Administered 2015-03-10: 300 ug via SUBCUTANEOUS
  Filled 2015-03-10: qty 0.6

## 2015-03-12 LAB — VITAMIN B12: VITAMIN B 12: 585 pg/mL (ref 211–911)

## 2015-03-12 LAB — ERYTHROPOIETIN: ERYTHROPOIETIN: 230.7 m[IU]/mL — AB (ref 2.6–18.5)

## 2015-03-31 ENCOUNTER — Other Ambulatory Visit (HOSPITAL_BASED_OUTPATIENT_CLINIC_OR_DEPARTMENT_OTHER): Payer: Medicare Other

## 2015-03-31 ENCOUNTER — Ambulatory Visit: Payer: Medicare Other

## 2015-03-31 DIAGNOSIS — D4620 Refractory anemia with excess of blasts, unspecified: Secondary | ICD-10-CM

## 2015-03-31 DIAGNOSIS — C321 Malignant neoplasm of supraglottis: Secondary | ICD-10-CM | POA: Diagnosis not present

## 2015-03-31 LAB — CBC WITH DIFFERENTIAL/PLATELET
BASO%: 1.1 % (ref 0.0–2.0)
BASOS ABS: 0 10*3/uL (ref 0.0–0.1)
EOS%: 3.1 % (ref 0.0–7.0)
Eosinophils Absolute: 0.1 10*3/uL (ref 0.0–0.5)
HEMATOCRIT: 31.5 % — AB (ref 38.4–49.9)
HGB: 10.1 g/dL — ABNORMAL LOW (ref 13.0–17.1)
LYMPH#: 0.8 10*3/uL — AB (ref 0.9–3.3)
LYMPH%: 29.1 % (ref 14.0–49.0)
MCH: 36.7 pg — AB (ref 27.2–33.4)
MCHC: 32 g/dL (ref 32.0–36.0)
MCV: 114.5 fL — ABNORMAL HIGH (ref 79.3–98.0)
MONO#: 0.4 10*3/uL (ref 0.1–0.9)
MONO%: 12.2 % (ref 0.0–14.0)
NEUT#: 1.6 10*3/uL (ref 1.5–6.5)
NEUT%: 54.5 % (ref 39.0–75.0)
PLATELETS: 251 10*3/uL (ref 140–400)
RBC: 2.75 10*6/uL — AB (ref 4.20–5.82)
RDW: 17.8 % — ABNORMAL HIGH (ref 11.0–14.6)
WBC: 2.9 10*3/uL — ABNORMAL LOW (ref 4.0–10.3)

## 2015-03-31 MED ORDER — DARBEPOETIN ALFA 300 MCG/0.6ML IJ SOSY: 300.0000 ug | PREFILLED_SYRINGE | Freq: Once | INTRAMUSCULAR | Status: DC

## 2015-04-21 ENCOUNTER — Other Ambulatory Visit (HOSPITAL_BASED_OUTPATIENT_CLINIC_OR_DEPARTMENT_OTHER): Payer: Medicare Other

## 2015-04-21 ENCOUNTER — Ambulatory Visit (HOSPITAL_BASED_OUTPATIENT_CLINIC_OR_DEPARTMENT_OTHER): Payer: Medicare Other

## 2015-04-21 DIAGNOSIS — D4620 Refractory anemia with excess of blasts, unspecified: Secondary | ICD-10-CM

## 2015-04-21 DIAGNOSIS — C321 Malignant neoplasm of supraglottis: Secondary | ICD-10-CM

## 2015-04-21 LAB — CBC WITH DIFFERENTIAL/PLATELET
BASO%: 0.8 % (ref 0.0–2.0)
BASOS ABS: 0 10*3/uL (ref 0.0–0.1)
EOS%: 2.1 % (ref 0.0–7.0)
Eosinophils Absolute: 0.1 10*3/uL (ref 0.0–0.5)
HEMATOCRIT: 29.5 % — AB (ref 38.4–49.9)
HEMOGLOBIN: 9.5 g/dL — AB (ref 13.0–17.1)
LYMPH#: 0.8 10*3/uL — AB (ref 0.9–3.3)
LYMPH%: 32 % (ref 14.0–49.0)
MCH: 36.7 pg — AB (ref 27.2–33.4)
MCHC: 32.4 g/dL (ref 32.0–36.0)
MCV: 113.2 fL — ABNORMAL HIGH (ref 79.3–98.0)
MONO#: 0.3 10*3/uL (ref 0.1–0.9)
MONO%: 12.3 % (ref 0.0–14.0)
NEUT#: 1.3 10*3/uL — ABNORMAL LOW (ref 1.5–6.5)
NEUT%: 52.8 % (ref 39.0–75.0)
Platelets: 235 10*3/uL (ref 140–400)
RBC: 2.6 10*6/uL — ABNORMAL LOW (ref 4.20–5.82)
RDW: 17.8 % — AB (ref 11.0–14.6)
WBC: 2.5 10*3/uL — ABNORMAL LOW (ref 4.0–10.3)

## 2015-04-21 MED ORDER — DARBEPOETIN ALFA 300 MCG/0.6ML IJ SOSY
300.0000 ug | PREFILLED_SYRINGE | Freq: Once | INTRAMUSCULAR | Status: AC
  Administered 2015-04-21: 300 ug via SUBCUTANEOUS
  Filled 2015-04-21: qty 0.6

## 2015-04-21 NOTE — Patient Instructions (Signed)
Darbepoetin Alfa injection What is this medicine? DARBEPOETIN ALFA (dar be POE e tin AL fa) helps your body make more red blood cells. It is used to treat anemia caused by chronic kidney failure and chemotherapy. This medicine may be used for other purposes; ask your health care provider or pharmacist if you have questions. What should I tell my health care provider before I take this medicine? They need to know if you have any of these conditions: -blood clotting disorders or history of blood clots -cancer patient not on chemotherapy -cystic fibrosis -heart disease, such as angina, heart failure, or a history of a heart attack -hemoglobin level of 12 g/dL or greater -high blood pressure -low levels of folate, iron, or vitamin B12 -seizures -an unusual or allergic reaction to darbepoetin, erythropoietin, albumin, hamster proteins, latex, other medicines, foods, dyes, or preservatives -pregnant or trying to get pregnant -breast-feeding How should I use this medicine? This medicine is for injection into a vein or under the skin. It is usually given by a health care professional in a hospital or clinic setting. If you get this medicine at home, you will be taught how to prepare and give this medicine. Do not shake the solution before you withdraw a dose. Use exactly as directed. Take your medicine at regular intervals. Do not take your medicine more often than directed. It is important that you put your used needles and syringes in a special sharps container. Do not put them in a trash can. If you do not have a sharps container, call your pharmacist or healthcare provider to get one. Talk to your pediatrician regarding the use of this medicine in children. While this medicine may be used in children as young as 1 year for selected conditions, precautions do apply. Overdosage: If you think you have taken too much of this medicine contact a poison control center or emergency room at once. NOTE:  This medicine is only for you. Do not share this medicine with others. What if I miss a dose? If you miss a dose, take it as soon as you can. If it is almost time for your next dose, take only that dose. Do not take double or extra doses. What may interact with this medicine? Do not take this medicine with any of the following medications: -epoetin alfa This list may not describe all possible interactions. Give your health care provider a list of all the medicines, herbs, non-prescription drugs, or dietary supplements you use. Also tell them if you smoke, drink alcohol, or use illegal drugs. Some items may interact with your medicine. What should I watch for while using this medicine? Visit your prescriber or health care professional for regular checks on your progress and for the needed blood tests and blood pressure measurements. It is especially important for the doctor to make sure your hemoglobin level is in the desired range, to limit the risk of potential side effects and to give you the best benefit. Keep all appointments for any recommended tests. Check your blood pressure as directed. Ask your doctor what your blood pressure should be and when you should contact him or her. As your body makes more red blood cells, you may need to take iron, folic acid, or vitamin B supplements. Ask your doctor or health care provider which products are right for you. If you have kidney disease continue dietary restrictions, even though this medication can make you feel better. Talk with your doctor or health care professional about the   foods you eat and the vitamins that you take. What side effects may I notice from receiving this medicine? Side effects that you should report to your doctor or health care professional as soon as possible: -allergic reactions like skin rash, itching or hives, swelling of the face, lips, or tongue -breathing problems -changes in vision -chest pain -confusion, trouble speaking  or understanding -feeling faint or lightheaded, falls -high blood pressure -muscle aches or pains -pain, swelling, warmth in the leg -rapid weight gain -severe headaches -sudden numbness or weakness of the face, arm or leg -trouble walking, dizziness, loss of balance or coordination -seizures (convulsions) -swelling of the ankles, feet, hands -unusually weak or tired Side effects that usually do not require medical attention (report to your doctor or health care professional if they continue or are bothersome): -diarrhea -fever, chills (flu-like symptoms) -headaches -nausea, vomiting -redness, stinging, or swelling at site where injected This list may not describe all possible side effects. Call your doctor for medical advice about side effects. You may report side effects to FDA at 1-800-FDA-1088. Where should I keep my medicine? Keep out of the reach of children. Store in a refrigerator between 2 and 8 degrees C (36 and 46 degrees F). Do not freeze. Do not shake. Throw away any unused portion if using a single-dose vial. Throw away any unused medicine after the expiration date. NOTE: This sheet is a summary. It may not cover all possible information. If you have questions about this medicine, talk to your doctor, pharmacist, or health care provider.    2016, Elsevier/Gold Standard. (2008-04-08 10:23:57)  

## 2015-05-12 ENCOUNTER — Other Ambulatory Visit (HOSPITAL_BASED_OUTPATIENT_CLINIC_OR_DEPARTMENT_OTHER): Payer: Medicare Other

## 2015-05-12 ENCOUNTER — Ambulatory Visit (HOSPITAL_BASED_OUTPATIENT_CLINIC_OR_DEPARTMENT_OTHER): Payer: Medicare Other

## 2015-05-12 VITALS — BP 155/75 | HR 58 | Temp 98.3°F

## 2015-05-12 DIAGNOSIS — C321 Malignant neoplasm of supraglottis: Secondary | ICD-10-CM

## 2015-05-12 DIAGNOSIS — D4620 Refractory anemia with excess of blasts, unspecified: Secondary | ICD-10-CM | POA: Diagnosis not present

## 2015-05-12 LAB — CBC WITH DIFFERENTIAL/PLATELET
BASO%: 1.1 % (ref 0.0–2.0)
BASOS ABS: 0 10*3/uL (ref 0.0–0.1)
EOS ABS: 0 10*3/uL (ref 0.0–0.5)
EOS%: 1.6 % (ref 0.0–7.0)
HEMATOCRIT: 29.2 % — AB (ref 38.4–49.9)
HEMOGLOBIN: 9.7 g/dL — AB (ref 13.0–17.1)
LYMPH#: 1 10*3/uL (ref 0.9–3.3)
LYMPH%: 36.9 % (ref 14.0–49.0)
MCH: 37.4 pg — AB (ref 27.2–33.4)
MCHC: 33.1 g/dL (ref 32.0–36.0)
MCV: 113.2 fL — AB (ref 79.3–98.0)
MONO#: 0.3 10*3/uL (ref 0.1–0.9)
MONO%: 11.8 % (ref 0.0–14.0)
NEUT#: 1.3 10*3/uL — ABNORMAL LOW (ref 1.5–6.5)
NEUT%: 48.6 % (ref 39.0–75.0)
Platelets: 224 10*3/uL (ref 140–400)
RBC: 2.58 10*6/uL — ABNORMAL LOW (ref 4.20–5.82)
RDW: 17.7 % — AB (ref 11.0–14.6)
WBC: 2.7 10*3/uL — ABNORMAL LOW (ref 4.0–10.3)

## 2015-05-12 MED ORDER — DARBEPOETIN ALFA 300 MCG/0.6ML IJ SOSY
300.0000 ug | PREFILLED_SYRINGE | Freq: Once | INTRAMUSCULAR | Status: AC
  Administered 2015-05-12: 300 ug via SUBCUTANEOUS
  Filled 2015-05-12: qty 0.6

## 2015-06-02 ENCOUNTER — Ambulatory Visit (HOSPITAL_BASED_OUTPATIENT_CLINIC_OR_DEPARTMENT_OTHER): Payer: Medicare Other

## 2015-06-02 ENCOUNTER — Other Ambulatory Visit (HOSPITAL_BASED_OUTPATIENT_CLINIC_OR_DEPARTMENT_OTHER): Payer: Medicare Other

## 2015-06-02 VITALS — BP 122/77 | HR 86 | Temp 98.3°F

## 2015-06-02 DIAGNOSIS — C321 Malignant neoplasm of supraglottis: Secondary | ICD-10-CM

## 2015-06-02 DIAGNOSIS — D4620 Refractory anemia with excess of blasts, unspecified: Secondary | ICD-10-CM

## 2015-06-02 LAB — CBC WITH DIFFERENTIAL/PLATELET
BASO%: 1.2 % (ref 0.0–2.0)
BASOS ABS: 0 10*3/uL (ref 0.0–0.1)
EOS%: 2.1 % (ref 0.0–7.0)
Eosinophils Absolute: 0.1 10*3/uL (ref 0.0–0.5)
HEMATOCRIT: 29.3 % — AB (ref 38.4–49.9)
HEMOGLOBIN: 9.6 g/dL — AB (ref 13.0–17.1)
LYMPH#: 0.8 10*3/uL — AB (ref 0.9–3.3)
LYMPH%: 26.8 % (ref 14.0–49.0)
MCH: 37.6 pg — AB (ref 27.2–33.4)
MCHC: 32.8 g/dL (ref 32.0–36.0)
MCV: 114.6 fL — ABNORMAL HIGH (ref 79.3–98.0)
MONO#: 0.3 10*3/uL (ref 0.1–0.9)
MONO%: 10.8 % (ref 0.0–14.0)
NEUT#: 1.8 10*3/uL (ref 1.5–6.5)
NEUT%: 59.1 % (ref 39.0–75.0)
Platelets: 252 10*3/uL (ref 140–400)
RBC: 2.55 10*6/uL — ABNORMAL LOW (ref 4.20–5.82)
RDW: 17.6 % — AB (ref 11.0–14.6)
WBC: 3.1 10*3/uL — AB (ref 4.0–10.3)

## 2015-06-02 MED ORDER — DARBEPOETIN ALFA 300 MCG/0.6ML IJ SOSY
300.0000 ug | PREFILLED_SYRINGE | Freq: Once | INTRAMUSCULAR | Status: AC
  Administered 2015-06-02: 300 ug via SUBCUTANEOUS
  Filled 2015-06-02: qty 0.6

## 2015-06-23 ENCOUNTER — Ambulatory Visit (HOSPITAL_BASED_OUTPATIENT_CLINIC_OR_DEPARTMENT_OTHER): Payer: Medicare Other

## 2015-06-23 ENCOUNTER — Other Ambulatory Visit (HOSPITAL_BASED_OUTPATIENT_CLINIC_OR_DEPARTMENT_OTHER): Payer: Medicare Other

## 2015-06-23 VITALS — BP 126/50 | HR 79 | Temp 97.9°F

## 2015-06-23 DIAGNOSIS — D4620 Refractory anemia with excess of blasts, unspecified: Secondary | ICD-10-CM | POA: Diagnosis not present

## 2015-06-23 DIAGNOSIS — C321 Malignant neoplasm of supraglottis: Secondary | ICD-10-CM

## 2015-06-23 LAB — CBC WITH DIFFERENTIAL/PLATELET
BASO%: 1.6 % (ref 0.0–2.0)
BASOS ABS: 0 10*3/uL (ref 0.0–0.1)
EOS%: 2.9 % (ref 0.0–7.0)
Eosinophils Absolute: 0.1 10*3/uL (ref 0.0–0.5)
HCT: 30.3 % — ABNORMAL LOW (ref 38.4–49.9)
HEMOGLOBIN: 9.9 g/dL — AB (ref 13.0–17.1)
LYMPH#: 0.8 10*3/uL — AB (ref 0.9–3.3)
LYMPH%: 31.4 % (ref 14.0–49.0)
MCH: 37.5 pg — ABNORMAL HIGH (ref 27.2–33.4)
MCHC: 32.7 g/dL (ref 32.0–36.0)
MCV: 114.8 fL — AB (ref 79.3–98.0)
MONO#: 0.3 10*3/uL (ref 0.1–0.9)
MONO%: 9.5 % (ref 0.0–14.0)
NEUT#: 1.4 10*3/uL — ABNORMAL LOW (ref 1.5–6.5)
NEUT%: 54.6 % (ref 39.0–75.0)
PLATELETS: 232 10*3/uL (ref 140–400)
RBC: 2.64 10*6/uL — AB (ref 4.20–5.82)
RDW: 17.5 % — AB (ref 11.0–14.6)
WBC: 2.6 10*3/uL — ABNORMAL LOW (ref 4.0–10.3)

## 2015-06-23 MED ORDER — DARBEPOETIN ALFA 300 MCG/0.6ML IJ SOSY
300.0000 ug | PREFILLED_SYRINGE | Freq: Once | INTRAMUSCULAR | Status: AC
  Administered 2015-06-23: 300 ug via SUBCUTANEOUS
  Filled 2015-06-23: qty 0.6

## 2015-06-30 ENCOUNTER — Telehealth: Payer: Self-pay | Admitting: Internal Medicine

## 2015-06-30 ENCOUNTER — Telehealth: Payer: Self-pay

## 2015-06-30 NOTE — Telephone Encounter (Signed)
Patient called for the second time requesting records to be sent to the [facility] hospital.  Forwarded message to HIM requesting them to call patient back.

## 2015-06-30 NOTE — Telephone Encounter (Signed)
Faxed pt medical records to [place] ref #[id number]

## 2015-07-14 ENCOUNTER — Other Ambulatory Visit (HOSPITAL_BASED_OUTPATIENT_CLINIC_OR_DEPARTMENT_OTHER): Payer: Medicare Other

## 2015-07-14 ENCOUNTER — Ambulatory Visit (HOSPITAL_BASED_OUTPATIENT_CLINIC_OR_DEPARTMENT_OTHER): Payer: Medicare Other

## 2015-07-14 VITALS — BP 156/74 | HR 76 | Temp 98.2°F

## 2015-07-14 DIAGNOSIS — D4620 Refractory anemia with excess of blasts, unspecified: Secondary | ICD-10-CM | POA: Diagnosis not present

## 2015-07-14 DIAGNOSIS — C321 Malignant neoplasm of supraglottis: Secondary | ICD-10-CM

## 2015-07-14 LAB — CBC WITH DIFFERENTIAL/PLATELET
BASO%: 1.1 % (ref 0.0–2.0)
Basophils Absolute: 0 10*3/uL (ref 0.0–0.1)
EOS%: 1.8 % (ref 0.0–7.0)
Eosinophils Absolute: 0 10*3/uL (ref 0.0–0.5)
HEMATOCRIT: 28.7 % — AB (ref 38.4–49.9)
HGB: 9.3 g/dL — ABNORMAL LOW (ref 13.0–17.1)
LYMPH#: 0.6 10*3/uL — AB (ref 0.9–3.3)
LYMPH%: 21 % (ref 14.0–49.0)
MCH: 37.4 pg — ABNORMAL HIGH (ref 27.2–33.4)
MCHC: 32.5 g/dL (ref 32.0–36.0)
MCV: 114.9 fL — ABNORMAL HIGH (ref 79.3–98.0)
MONO#: 0.3 10*3/uL (ref 0.1–0.9)
MONO%: 10.8 % (ref 0.0–14.0)
NEUT%: 65.3 % (ref 39.0–75.0)
NEUTROS ABS: 1.8 10*3/uL (ref 1.5–6.5)
PLATELETS: 232 10*3/uL (ref 140–400)
RBC: 2.5 10*6/uL — ABNORMAL LOW (ref 4.20–5.82)
RDW: 17.1 % — AB (ref 11.0–14.6)
WBC: 2.8 10*3/uL — AB (ref 4.0–10.3)

## 2015-07-14 MED ORDER — DARBEPOETIN ALFA 300 MCG/0.6ML IJ SOSY
300.0000 ug | PREFILLED_SYRINGE | Freq: Once | INTRAMUSCULAR | Status: AC
  Administered 2015-07-14: 300 ug via SUBCUTANEOUS
  Filled 2015-07-14: qty 0.6

## 2015-08-04 ENCOUNTER — Other Ambulatory Visit (HOSPITAL_BASED_OUTPATIENT_CLINIC_OR_DEPARTMENT_OTHER): Payer: Medicare Other

## 2015-08-04 ENCOUNTER — Ambulatory Visit (HOSPITAL_BASED_OUTPATIENT_CLINIC_OR_DEPARTMENT_OTHER): Payer: Medicare Other

## 2015-08-04 VITALS — BP 142/67 | HR 81 | Temp 98.8°F

## 2015-08-04 DIAGNOSIS — D4620 Refractory anemia with excess of blasts, unspecified: Secondary | ICD-10-CM

## 2015-08-04 DIAGNOSIS — C321 Malignant neoplasm of supraglottis: Secondary | ICD-10-CM

## 2015-08-04 LAB — CBC WITH DIFFERENTIAL/PLATELET
BASO%: 1.1 % (ref 0.0–2.0)
BASOS ABS: 0 10*3/uL (ref 0.0–0.1)
EOS%: 0.8 % (ref 0.0–7.0)
Eosinophils Absolute: 0 10*3/uL (ref 0.0–0.5)
HCT: 29.1 % — ABNORMAL LOW (ref 38.4–49.9)
HEMOGLOBIN: 9.3 g/dL — AB (ref 13.0–17.1)
LYMPH%: 23 % (ref 14.0–49.0)
MCH: 37.1 pg — AB (ref 27.2–33.4)
MCHC: 32.1 g/dL (ref 32.0–36.0)
MCV: 115.6 fL — AB (ref 79.3–98.0)
MONO#: 0.5 10*3/uL (ref 0.1–0.9)
MONO%: 13.5 % (ref 0.0–14.0)
NEUT%: 61.6 % (ref 39.0–75.0)
NEUTROS ABS: 2.1 10*3/uL (ref 1.5–6.5)
Platelets: 205 10*3/uL (ref 140–400)
RBC: 2.51 10*6/uL — AB (ref 4.20–5.82)
RDW: 16.8 % — AB (ref 11.0–14.6)
WBC: 3.5 10*3/uL — AB (ref 4.0–10.3)
lymph#: 0.8 10*3/uL — ABNORMAL LOW (ref 0.9–3.3)

## 2015-08-04 MED ORDER — DARBEPOETIN ALFA 300 MCG/0.6ML IJ SOSY
300.0000 ug | PREFILLED_SYRINGE | Freq: Once | INTRAMUSCULAR | Status: AC
  Administered 2015-08-04: 300 ug via SUBCUTANEOUS
  Filled 2015-08-04: qty 0.6

## 2015-08-25 ENCOUNTER — Ambulatory Visit (HOSPITAL_BASED_OUTPATIENT_CLINIC_OR_DEPARTMENT_OTHER): Payer: Medicare Other

## 2015-08-25 ENCOUNTER — Telehealth: Payer: Self-pay | Admitting: Hematology and Oncology

## 2015-08-25 ENCOUNTER — Ambulatory Visit (HOSPITAL_BASED_OUTPATIENT_CLINIC_OR_DEPARTMENT_OTHER): Payer: Medicare Other | Admitting: Hematology and Oncology

## 2015-08-25 ENCOUNTER — Other Ambulatory Visit (HOSPITAL_BASED_OUTPATIENT_CLINIC_OR_DEPARTMENT_OTHER): Payer: Medicare Other

## 2015-08-25 VITALS — BP 130/67 | HR 80 | Temp 97.6°F | Resp 18 | Ht 73.0 in | Wt 113.7 lb

## 2015-08-25 DIAGNOSIS — D4620 Refractory anemia with excess of blasts, unspecified: Secondary | ICD-10-CM | POA: Diagnosis not present

## 2015-08-25 DIAGNOSIS — Z720 Tobacco use: Secondary | ICD-10-CM

## 2015-08-25 DIAGNOSIS — Z8589 Personal history of malignant neoplasm of other organs and systems: Secondary | ICD-10-CM | POA: Diagnosis not present

## 2015-08-25 DIAGNOSIS — C321 Malignant neoplasm of supraglottis: Secondary | ICD-10-CM

## 2015-08-25 LAB — CBC WITH DIFFERENTIAL/PLATELET
BASO%: 0.8 % (ref 0.0–2.0)
Basophils Absolute: 0 10*3/uL (ref 0.0–0.1)
EOS%: 1.6 % (ref 0.0–7.0)
Eosinophils Absolute: 0 10*3/uL (ref 0.0–0.5)
HCT: 28.6 % — ABNORMAL LOW (ref 38.4–49.9)
HGB: 9.4 g/dL — ABNORMAL LOW (ref 13.0–17.1)
LYMPH#: 0.7 10*3/uL — AB (ref 0.9–3.3)
LYMPH%: 28.2 % (ref 14.0–49.0)
MCH: 37.9 pg — ABNORMAL HIGH (ref 27.2–33.4)
MCHC: 33 g/dL (ref 32.0–36.0)
MCV: 115 fL — ABNORMAL HIGH (ref 79.3–98.0)
MONO#: 0.3 10*3/uL (ref 0.1–0.9)
MONO%: 12.4 % (ref 0.0–14.0)
NEUT%: 57 % (ref 39.0–75.0)
NEUTROS ABS: 1.5 10*3/uL (ref 1.5–6.5)
PLATELETS: 228 10*3/uL (ref 140–400)
RBC: 2.49 10*6/uL — AB (ref 4.20–5.82)
RDW: 16.9 % — ABNORMAL HIGH (ref 11.0–14.6)
WBC: 2.6 10*3/uL — AB (ref 4.0–10.3)

## 2015-08-25 MED ORDER — DARBEPOETIN ALFA 500 MCG/ML IJ SOSY
500.0000 ug | PREFILLED_SYRINGE | Freq: Once | INTRAMUSCULAR | Status: AC
  Administered 2015-08-25: 500 ug via SUBCUTANEOUS
  Filled 2015-08-25: qty 1

## 2015-08-25 NOTE — Progress Notes (Signed)
[facility] OFFICE PROGRESS NOTE  Patient Care Team: [name], MD as PCP - General (Family Medicine) [name], MD as Consulting Physician (Hematology and Oncology) Leota Sauers, RN as Registered Nurse (Oncology)  SUMMARY OF ONCOLOGIC HISTORY: Oncology History   MDS, R-IPSS score of 3, low risk (Hg 8.9, +16 chromosome on BM, 2% blast count)   Squamous cell carcinoma of the epiglottis   Primary site: Larynx - Supraglottis (Left)   Staging method: AJCC 7th Edition   Clinical: Stage I (T1, N0, M0) signed by [name], MD on 04/30/2013 12:49 PM   Pathologic: Stage I (T1, N0, cM0) signed by [name], MD on 04/30/2013 12:49 PM   Summary: Stage I (T1, N0, cM0)       MDS (myelodysplastic syndrome), low grade ([place])   02/01/2007 Bone Marrow Biopsy BM biopsy was abnormal, overall probable low grade MDS   03/23/2011 - 02/06/2013 Chemotherapy [name], discontinued due to diagnosis of laryngeal ca   09/06/2013 Bone Marrow Biopsy Repeat bone marrow aspirate and biopsy confirmed low-grade myelodysplastic syndrome.   09/17/2013 -  Chemotherapy The patient resumed darbepoetin injections to treat the anemia.    History of head and neck cancer   03/06/2013 Procedure Biopsy from epiglottic region showed invasive [facility]   04/25/2013 Imaging Ct scan showed no other involvement in the LN   05/13/2013 - 06/28/2013 Radiation Therapy He received radiation therapy, 70 gray in 35 fractions to the larynx    INTERVAL HISTORY: Please see below for problem oriented charting. He returns today for further follow-up regarding his history of supraglottic cancer and MDS He denies swallowing difficulties. No recent dysphagia or new neck lymphadenopathy. He complained of mild fatigue. The patient denies any recent signs or symptoms of bleeding such as spontaneous epistaxis, hematuria or hematochezia. No recent infection. REVIEW OF SYSTEMS:   Constitutional: Denies fevers, chills  or abnormal weight loss Eyes: Denies blurriness of vision Ears, nose, mouth, throat, and face: Denies mucositis or sore throat Respiratory: Denies cough, dyspnea or wheezes Cardiovascular: Denies palpitation, chest discomfort or lower extremity swelling Gastrointestinal:  Denies nausea, heartburn or change in bowel habits Skin: Denies abnormal skin rashes Lymphatics: Denies new lymphadenopathy or easy bruising Neurological:Denies numbness, tingling or new weaknesses Behavioral/Psych: Mood is stable, no new changes  All other systems were reviewed with the patient and are negative.  I have reviewed the past medical history, past surgical history, social history and family history with the patient and they are unchanged from previous note.  ALLERGIES:  has No Known Allergies.  MEDICATIONS:  Current Outpatient Prescriptions  Medication Sig Dispense Refill  . cyanocobalamin 500 MCG tablet Take 500 mcg by mouth daily.    . diazepam (VALIUM) 5 MG tablet Take 10 mg by mouth every 6 (six) hours as needed for anxiety.     . ferrous sulfate 325 (65 FE) MG tablet Take 325 mg by mouth daily with breakfast.    . folic acid (FOLVITE) 1 MG tablet Take 1 mg by mouth daily.    Marland Kitchen levETIRAcetam (KEPPRA) 500 MG tablet TAKE 1 TABLET (500 MG TOTAL) BY MOUTH 2 (TWO) TIMES DAILY. 60 tablet 11  . mometasone (NASONEX) 50 MCG/ACT nasal spray Place 2 sprays into both nostrils daily.    . vitamin E 1000 UNIT capsule Take 1,000 Units by mouth daily.      No current facility-administered medications for this visit.    PHYSICAL EXAMINATION: ECOG PERFORMANCE STATUS: 0 - Asymptomatic  Filed  Vitals:   08/25/15 1052  BP: 130/67  Pulse: 80  Temp: 97.6 F (36.4 C)  Resp: 18   Filed Weights   08/25/15 1052  Weight: 113 lb 11.2 oz (51.574 kg)    GENERAL:alert, no distress and comfortable SKIN: skin color, texture, turgor are normal, no rashes or significant lesions EYES: normal, Conjunctiva are pink and  non-injected, sclera clear OROPHARYNX:no exudate, no erythema and lips, buccal mucosa, and tongue normal  NECK: neck is woody from prior radiation LYMPH:  no palpable lymphadenopathy in the cervical, axillary or inguinal LUNGS: clear to auscultation and percussion with normal breathing effort HEART: regular rate & rhythm and no murmurs and no lower extremity edema ABDOMEN:abdomen soft, non-tender and normal bowel sounds Musculoskeletal:no cyanosis of digits and no clubbing  NEURO: alert & oriented x 3 with fluent speech, no focal motor/sensory deficits  LABORATORY DATA:  I have reviewed the data as listed    Component Value Date/Time   NA 139 06/21/2014 0521   NA 139 09/17/2013 1158   K 3.7 06/21/2014 0521   K 4.1 09/17/2013 1158   CL 106 06/21/2014 0521   CL 107 09/19/2012 1325   CO2 25 06/21/2014 0521   CO2 20* 09/17/2013 1158   GLUCOSE 105* 06/21/2014 0521   GLUCOSE 77 09/17/2013 1158   GLUCOSE 97 09/19/2012 1325   BUN 19 06/21/2014 0521   BUN 17.4 09/17/2013 1158   CREATININE 0.83 06/21/2014 0521   CREATININE 0.9 09/17/2013 1158   CALCIUM 8.6 06/21/2014 0521   CALCIUM 9.3 09/17/2013 1158   PROT 8.5* 06/21/2014 0521   PROT 8.5* 09/17/2013 1158   ALBUMIN 3.4* 06/21/2014 0521   ALBUMIN 3.5 09/17/2013 1158   AST 15 06/21/2014 0521   AST 10 09/17/2013 1158   ALT 8 06/21/2014 0521   ALT <6 09/17/2013 1158   ALKPHOS 129* 06/21/2014 0521   ALKPHOS 151* 09/17/2013 1158   BILITOT 0.3 06/21/2014 0521   BILITOT 0.23 09/17/2013 1158   GFRNONAA >90 06/21/2014 0521   GFRAA >90 06/21/2014 0521    No results found for: SPEP, UPEP  Lab Results  Component Value Date   WBC 2.6* 08/25/2015   NEUTROABS 1.5 08/25/2015   HGB 9.4* 08/25/2015   HCT 28.6* 08/25/2015   MCV 115.0* 08/25/2015   PLT 228 08/25/2015      Chemistry      Component Value Date/Time   NA 139 06/21/2014 0521   NA 139 09/17/2013 1158   K 3.7 06/21/2014 0521   K 4.1 09/17/2013 1158   CL 106 06/21/2014  0521   CL 107 09/19/2012 1325   CO2 25 06/21/2014 0521   CO2 20* 09/17/2013 1158   BUN 19 06/21/2014 0521   BUN 17.4 09/17/2013 1158   CREATININE 0.83 06/21/2014 0521   CREATININE 0.9 09/17/2013 1158      Component Value Date/Time   CALCIUM 8.6 06/21/2014 0521   CALCIUM 9.3 09/17/2013 1158   ALKPHOS 129* 06/21/2014 0521   ALKPHOS 151* 09/17/2013 1158   AST 15 06/21/2014 0521   AST 10 09/17/2013 1158   ALT 8 06/21/2014 0521   ALT <6 09/17/2013 1158   BILITOT 0.3 06/21/2014 0521   BILITOT 0.23 09/17/2013 1158     ASSESSMENT & PLAN:  History of head and neck cancer Clinically, he has no signs of recurrence. He will continue close follow-up with ENT. I recommend the patient to quit smoking.   MDS (myelodysplastic syndrome), low grade The anemia from MDS is stable. I  suspect he may have some degree of bone marrow suppression from his antiseizure medications. He had responded well to Darbopeitin in the past with improvement in energy level. The patient will return every 3 weeks to get blood work checked and he will get injection to keep hemoglobin greater than 10 g. I plan to increase the dose of 500 g. We will recheck serum B-12, iron studies and erythropoietin level in November  Tobacco abuse I spent some time counseling the patient the importance of tobacco cessation. he is currently attempting to quit on his own    No orders of the defined types were placed in this encounter.   All questions were answered. The patient knows to call the clinic with any problems, questions or concerns. No barriers to learning was detected. I spent 15 minutes counseling the patient face to face. The total time spent in the appointment was 20 minutes and more than 50% was on counseling and review of test results     [facility], [place], MD 08/25/2015 11:05 AM

## 2015-08-25 NOTE — Assessment & Plan Note (Signed)
I spent some time counseling the patient the importance of tobacco cessation. he is currently attempting to quit on his own 

## 2015-08-25 NOTE — Assessment & Plan Note (Signed)
The anemia from MDS is stable. I suspect he may have some degree of bone marrow suppression from his antiseizure medications. He had responded well to Darbopeitin in the past with improvement in energy level. The patient will return every 3 weeks to get blood work checked and he will get injection to keep hemoglobin greater than 10 g. I plan to increase the dose of 500 g. We will recheck serum B-12, iron studies and erythropoietin level in November

## 2015-08-25 NOTE — Assessment & Plan Note (Signed)
Clinically, he has no signs of recurrence. He will continue close follow-up with ENT. I recommend the patient to quit smoking. 

## 2015-08-25 NOTE — Telephone Encounter (Signed)
Gave and printed appt sched and avs for pt for May thru OCT

## 2015-09-02 ENCOUNTER — Telehealth: Payer: Self-pay | Admitting: Internal Medicine

## 2015-09-02 NOTE — Telephone Encounter (Signed)
FAXED PT FLOWSHEET TO [place] [phone]

## 2015-09-02 NOTE — Telephone Encounter (Signed)
Call received from spouse stating she called previously asking for records be sent to [facility] in [place].  Fax number [phone].  We hope they can assist with payment of the expensive aranesp shots he receives every two weeks.  Call transferred to H.I.M., ext [phone].

## 2015-09-04 DIAGNOSIS — Z8521 Personal history of malignant neoplasm of larynx: Secondary | ICD-10-CM | POA: Diagnosis not present

## 2015-09-04 DIAGNOSIS — F17210 Nicotine dependence, cigarettes, uncomplicated: Secondary | ICD-10-CM | POA: Diagnosis not present

## 2015-09-14 ENCOUNTER — Encounter: Payer: Self-pay | Admitting: Gastroenterology

## 2015-09-15 ENCOUNTER — Ambulatory Visit (HOSPITAL_BASED_OUTPATIENT_CLINIC_OR_DEPARTMENT_OTHER): Payer: Medicare Other

## 2015-09-15 ENCOUNTER — Other Ambulatory Visit (HOSPITAL_BASED_OUTPATIENT_CLINIC_OR_DEPARTMENT_OTHER): Payer: Medicare Other

## 2015-09-15 VITALS — BP 145/76 | HR 65 | Temp 97.8°F | Resp 18

## 2015-09-15 DIAGNOSIS — C321 Malignant neoplasm of supraglottis: Secondary | ICD-10-CM

## 2015-09-15 DIAGNOSIS — D4620 Refractory anemia with excess of blasts, unspecified: Secondary | ICD-10-CM

## 2015-09-15 LAB — CBC WITH DIFFERENTIAL/PLATELET
BASO%: 0.7 % (ref 0.0–2.0)
Basophils Absolute: 0 10*3/uL (ref 0.0–0.1)
EOS ABS: 0 10*3/uL (ref 0.0–0.5)
EOS%: 0.7 % (ref 0.0–7.0)
HCT: 30.2 % — ABNORMAL LOW (ref 38.4–49.9)
HEMOGLOBIN: 9.9 g/dL — AB (ref 13.0–17.1)
LYMPH%: 31.6 % (ref 14.0–49.0)
MCH: 37.1 pg — AB (ref 27.2–33.4)
MCHC: 32.8 g/dL (ref 32.0–36.0)
MCV: 113.1 fL — AB (ref 79.3–98.0)
MONO#: 0.4 10*3/uL (ref 0.1–0.9)
MONO%: 13.6 % (ref 0.0–14.0)
NEUT%: 53.4 % (ref 39.0–75.0)
NEUTROS ABS: 1.6 10*3/uL (ref 1.5–6.5)
Platelets: 234 10*3/uL (ref 140–400)
RBC: 2.67 10*6/uL — ABNORMAL LOW (ref 4.20–5.82)
RDW: 16.8 % — AB (ref 11.0–14.6)
WBC: 2.9 10*3/uL — AB (ref 4.0–10.3)
lymph#: 0.9 10*3/uL (ref 0.9–3.3)

## 2015-09-15 MED ORDER — DARBEPOETIN ALFA 500 MCG/ML IJ SOSY
500.0000 ug | PREFILLED_SYRINGE | Freq: Once | INTRAMUSCULAR | Status: AC
  Administered 2015-09-15: 500 ug via SUBCUTANEOUS
  Filled 2015-09-15: qty 1

## 2015-10-06 ENCOUNTER — Other Ambulatory Visit (HOSPITAL_BASED_OUTPATIENT_CLINIC_OR_DEPARTMENT_OTHER): Payer: Medicare Other

## 2015-10-06 ENCOUNTER — Ambulatory Visit (HOSPITAL_BASED_OUTPATIENT_CLINIC_OR_DEPARTMENT_OTHER): Payer: Medicare Other

## 2015-10-06 VITALS — BP 118/64 | HR 86 | Temp 98.3°F | Resp 18

## 2015-10-06 DIAGNOSIS — D4620 Refractory anemia with excess of blasts, unspecified: Secondary | ICD-10-CM

## 2015-10-06 DIAGNOSIS — C321 Malignant neoplasm of supraglottis: Secondary | ICD-10-CM

## 2015-10-06 LAB — CBC WITH DIFFERENTIAL/PLATELET
BASO%: 1.3 % (ref 0.0–2.0)
BASOS ABS: 0 10*3/uL (ref 0.0–0.1)
EOS%: 1.5 % (ref 0.0–7.0)
Eosinophils Absolute: 0 10*3/uL (ref 0.0–0.5)
HEMATOCRIT: 26.8 % — AB (ref 38.4–49.9)
HEMOGLOBIN: 8.6 g/dL — AB (ref 13.0–17.1)
LYMPH#: 0.6 10*3/uL — AB (ref 0.9–3.3)
LYMPH%: 23.4 % (ref 14.0–49.0)
MCH: 37.1 pg — ABNORMAL HIGH (ref 27.2–33.4)
MCHC: 32.3 g/dL (ref 32.0–36.0)
MCV: 115 fL — ABNORMAL HIGH (ref 79.3–98.0)
MONO#: 0.3 10*3/uL (ref 0.1–0.9)
MONO%: 11.7 % (ref 0.0–14.0)
NEUT#: 1.7 10*3/uL (ref 1.5–6.5)
NEUT%: 62.1 % (ref 39.0–75.0)
Platelets: 250 10*3/uL (ref 140–400)
RBC: 2.33 10*6/uL — ABNORMAL LOW (ref 4.20–5.82)
RDW: 17.7 % — AB (ref 11.0–14.6)
WBC: 2.7 10*3/uL — ABNORMAL LOW (ref 4.0–10.3)

## 2015-10-06 MED ORDER — DARBEPOETIN ALFA 500 MCG/ML IJ SOSY
500.0000 ug | PREFILLED_SYRINGE | Freq: Once | INTRAMUSCULAR | Status: AC
  Administered 2015-10-06: 500 ug via SUBCUTANEOUS
  Filled 2015-10-06: qty 1

## 2015-10-06 NOTE — Patient Instructions (Signed)
Darbepoetin Alfa injection (Aranesp) What is this medicine? DARBEPOETIN ALFA (dar be POE e tin AL fa) helps your body make more red blood cells. It is used to treat anemia caused by chronic kidney failure and chemotherapy. This medicine may be used for other purposes; ask your health care provider or pharmacist if you have questions. What should I tell my health care provider before I take this medicine? They need to know if you have any of these conditions: -blood clotting disorders or history of blood clots -cancer patient not on chemotherapy -cystic fibrosis -heart disease, such as angina, heart failure, or a history of a heart attack -hemoglobin level of 12 g/dL or greater -high blood pressure -low levels of folate, iron, or vitamin B12 -seizures -an unusual or allergic reaction to darbepoetin, erythropoietin, albumin, hamster proteins, latex, other medicines, foods, dyes, or preservatives -pregnant or trying to get pregnant -breast-feeding How should I use this medicine? This medicine is for injection into a vein or under the skin. It is usually given by a health care professional in a hospital or clinic setting. If you get this medicine at home, you will be taught how to prepare and give this medicine. Do not shake the solution before you withdraw a dose. Use exactly as directed. Take your medicine at regular intervals. Do not take your medicine more often than directed. It is important that you put your used needles and syringes in a special sharps container. Do not put them in a trash can. If you do not have a sharps container, call your pharmacist or healthcare provider to get one. Talk to your pediatrician regarding the use of this medicine in children. While this medicine may be used in children as young as 1 year for selected conditions, precautions do apply. Overdosage: If you think you have taken too much of this medicine contact a poison control center or emergency room at  once. NOTE: This medicine is only for you. Do not share this medicine with others. What if I miss a dose? If you miss a dose, take it as soon as you can. If it is almost time for your next dose, take only that dose. Do not take double or extra doses. What may interact with this medicine? Do not take this medicine with any of the following medications: -epoetin alfa This list may not describe all possible interactions. Give your health care provider a list of all the medicines, herbs, non-prescription drugs, or dietary supplements you use. Also tell them if you smoke, drink alcohol, or use illegal drugs. Some items may interact with your medicine. What should I watch for while using this medicine? Visit your prescriber or health care professional for regular checks on your progress and for the needed blood tests and blood pressure measurements. It is especially important for the doctor to make sure your hemoglobin level is in the desired range, to limit the risk of potential side effects and to give you the best benefit. Keep all appointments for any recommended tests. Check your blood pressure as directed. Ask your doctor what your blood pressure should be and when you should contact him or her. As your body makes more red blood cells, you may need to take iron, folic acid, or vitamin B supplements. Ask your doctor or health care provider which products are right for you. If you have kidney disease continue dietary restrictions, even though this medication can make you feel better. Talk with your doctor or health care professional about  the foods you eat and the vitamins that you take. What side effects may I notice from receiving this medicine? Side effects that you should report to your doctor or health care professional as soon as possible: -allergic reactions like skin rash, itching or hives, swelling of the face, lips, or tongue -breathing problems -changes in vision -chest pain -confusion,  trouble speaking or understanding -feeling faint or lightheaded, falls -high blood pressure -muscle aches or pains -pain, swelling, warmth in the leg -rapid weight gain -severe headaches -sudden numbness or weakness of the face, arm or leg -trouble walking, dizziness, loss of balance or coordination -seizures (convulsions) -swelling of the ankles, feet, hands -unusually weak or tired Side effects that usually do not require medical attention (report to your doctor or health care professional if they continue or are bothersome): -diarrhea -fever, chills (flu-like symptoms) -headaches -nausea, vomiting -redness, stinging, or swelling at site where injected This list may not describe all possible side effects. Call your doctor for medical advice about side effects. You may report side effects to FDA at 1-800-FDA-1088. Where should I keep my medicine? Keep out of the reach of children. Store in a refrigerator between 2 and 8 degrees C (36 and 46 degrees F). Do not freeze. Do not shake. Throw away any unused portion if using a single-dose vial. Throw away any unused medicine after the expiration date. NOTE: This sheet is a summary. It may not cover all possible information. If you have questions about this medicine, talk to your doctor, pharmacist, or health care provider.    2016, [employer]vier/[employer] Standard. (2008-04-08 10:23:57)

## 2015-10-27 ENCOUNTER — Ambulatory Visit (HOSPITAL_BASED_OUTPATIENT_CLINIC_OR_DEPARTMENT_OTHER): Payer: Medicare Other

## 2015-10-27 ENCOUNTER — Other Ambulatory Visit (HOSPITAL_BASED_OUTPATIENT_CLINIC_OR_DEPARTMENT_OTHER): Payer: Medicare Other

## 2015-10-27 VITALS — BP 131/65 | HR 75 | Temp 98.3°F | Resp 18

## 2015-10-27 DIAGNOSIS — D4620 Refractory anemia with excess of blasts, unspecified: Secondary | ICD-10-CM

## 2015-10-27 DIAGNOSIS — C321 Malignant neoplasm of supraglottis: Secondary | ICD-10-CM

## 2015-10-27 LAB — CBC WITH DIFFERENTIAL/PLATELET
BASO%: 1.1 % (ref 0.0–2.0)
Basophils Absolute: 0 10*3/uL (ref 0.0–0.1)
EOS ABS: 0 10*3/uL (ref 0.0–0.5)
EOS%: 1.5 % (ref 0.0–7.0)
HCT: 27.3 % — ABNORMAL LOW (ref 38.4–49.9)
HEMOGLOBIN: 8.9 g/dL — AB (ref 13.0–17.1)
LYMPH%: 28.5 % (ref 14.0–49.0)
MCH: 37.3 pg — ABNORMAL HIGH (ref 27.2–33.4)
MCHC: 32.4 g/dL (ref 32.0–36.0)
MCV: 115.2 fL — ABNORMAL HIGH (ref 79.3–98.0)
MONO#: 0.3 10*3/uL (ref 0.1–0.9)
MONO%: 12.8 % (ref 0.0–14.0)
NEUT%: 56.1 % (ref 39.0–75.0)
NEUTROS ABS: 1.5 10*3/uL (ref 1.5–6.5)
Platelets: 230 10*3/uL (ref 140–400)
RBC: 2.37 10*6/uL — ABNORMAL LOW (ref 4.20–5.82)
RDW: 17.5 % — AB (ref 11.0–14.6)
WBC: 2.7 10*3/uL — AB (ref 4.0–10.3)
lymph#: 0.8 10*3/uL — ABNORMAL LOW (ref 0.9–3.3)

## 2015-10-27 MED ORDER — DARBEPOETIN ALFA 500 MCG/ML IJ SOSY
500.0000 ug | PREFILLED_SYRINGE | Freq: Once | INTRAMUSCULAR | Status: AC
  Administered 2015-10-27: 500 ug via SUBCUTANEOUS
  Filled 2015-10-27: qty 1

## 2015-10-27 NOTE — Patient Instructions (Signed)
Darbepoetin Alfa injection (Aranesp) What is this medicine? DARBEPOETIN ALFA (dar be POE e tin AL fa) helps your body make more red blood cells. It is used to treat anemia caused by chronic kidney failure and chemotherapy. This medicine may be used for other purposes; ask your health care provider or pharmacist if you have questions. What should I tell my health care provider before I take this medicine? They need to know if you have any of these conditions: -blood clotting disorders or history of blood clots -cancer patient not on chemotherapy -cystic fibrosis -heart disease, such as angina, heart failure, or a history of a heart attack -hemoglobin level of 12 g/dL or greater -high blood pressure -low levels of folate, iron, or vitamin B12 -seizures -an unusual or allergic reaction to darbepoetin, erythropoietin, albumin, hamster proteins, latex, other medicines, foods, dyes, or preservatives -pregnant or trying to get pregnant -breast-feeding How should I use this medicine? This medicine is for injection into a vein or under the skin. It is usually given by a health care professional in a hospital or clinic setting. If you get this medicine at home, you will be taught how to prepare and give this medicine. Do not shake the solution before you withdraw a dose. Use exactly as directed. Take your medicine at regular intervals. Do not take your medicine more often than directed. It is important that you put your used needles and syringes in a special sharps container. Do not put them in a trash can. If you do not have a sharps container, call your pharmacist or healthcare provider to get one. Talk to your pediatrician regarding the use of this medicine in children. While this medicine may be used in children as young as 1 year for selected conditions, precautions do apply. Overdosage: If you think you have taken too much of this medicine contact a poison control center or emergency room at  once. NOTE: This medicine is only for you. Do not share this medicine with others. What if I miss a dose? If you miss a dose, take it as soon as you can. If it is almost time for your next dose, take only that dose. Do not take double or extra doses. What may interact with this medicine? Do not take this medicine with any of the following medications: -epoetin alfa This list may not describe all possible interactions. Give your health care provider a list of all the medicines, herbs, non-prescription drugs, or dietary supplements you use. Also tell them if you smoke, drink alcohol, or use illegal drugs. Some items may interact with your medicine. What should I watch for while using this medicine? Visit your prescriber or health care professional for regular checks on your progress and for the needed blood tests and blood pressure measurements. It is especially important for the doctor to make sure your hemoglobin level is in the desired range, to limit the risk of potential side effects and to give you the best benefit. Keep all appointments for any recommended tests. Check your blood pressure as directed. Ask your doctor what your blood pressure should be and when you should contact him or her. As your body makes more red blood cells, you may need to take iron, folic acid, or vitamin B supplements. Ask your doctor or health care provider which products are right for you. If you have kidney disease continue dietary restrictions, even though this medication can make you feel better. Talk with your doctor or health care professional about  the foods you eat and the vitamins that you take. What side effects may I notice from receiving this medicine? Side effects that you should report to your doctor or health care professional as soon as possible: -allergic reactions like skin rash, itching or hives, swelling of the face, lips, or tongue -breathing problems -changes in vision -chest pain -confusion,  trouble speaking or understanding -feeling faint or lightheaded, falls -high blood pressure -muscle aches or pains -pain, swelling, warmth in the leg -rapid weight gain -severe headaches -sudden numbness or weakness of the face, arm or leg -trouble walking, dizziness, loss of balance or coordination -seizures (convulsions) -swelling of the ankles, feet, hands -unusually weak or tired Side effects that usually do not require medical attention (report to your doctor or health care professional if they continue or are bothersome): -diarrhea -fever, chills (flu-like symptoms) -headaches -nausea, vomiting -redness, stinging, or swelling at site where injected This list may not describe all possible side effects. Call your doctor for medical advice about side effects. You may report side effects to FDA at 1-800-FDA-1088. Where should I keep my medicine? Keep out of the reach of children. Store in a refrigerator between 2 and 8 degrees C (36 and 46 degrees F). Do not freeze. Do not shake. Throw away any unused portion if using a single-dose vial. Throw away any unused medicine after the expiration date. NOTE: This sheet is a summary. It may not cover all possible information. If you have questions about this medicine, talk to your doctor, pharmacist, or health care provider.    2016, [employer]vier/[employer] Standard. (2008-04-08 10:23:57)

## 2015-11-17 ENCOUNTER — Ambulatory Visit (HOSPITAL_BASED_OUTPATIENT_CLINIC_OR_DEPARTMENT_OTHER): Payer: Medicare Other

## 2015-11-17 ENCOUNTER — Other Ambulatory Visit (HOSPITAL_BASED_OUTPATIENT_CLINIC_OR_DEPARTMENT_OTHER): Payer: Medicare Other

## 2015-11-17 VITALS — BP 135/61 | HR 79 | Temp 98.4°F | Resp 20

## 2015-11-17 DIAGNOSIS — D4620 Refractory anemia with excess of blasts, unspecified: Secondary | ICD-10-CM | POA: Diagnosis not present

## 2015-11-17 DIAGNOSIS — C321 Malignant neoplasm of supraglottis: Secondary | ICD-10-CM

## 2015-11-17 LAB — CBC WITH DIFFERENTIAL/PLATELET
BASO%: 1 % (ref 0.0–2.0)
BASOS ABS: 0 10*3/uL (ref 0.0–0.1)
EOS ABS: 0 10*3/uL (ref 0.0–0.5)
EOS%: 1.8 % (ref 0.0–7.0)
HCT: 27 % — ABNORMAL LOW (ref 38.4–49.9)
HEMOGLOBIN: 8.8 g/dL — AB (ref 13.0–17.1)
LYMPH%: 23.5 % (ref 14.0–49.0)
MCH: 37.8 pg — AB (ref 27.2–33.4)
MCHC: 32.8 g/dL (ref 32.0–36.0)
MCV: 115.3 fL — ABNORMAL HIGH (ref 79.3–98.0)
MONO#: 0.3 10*3/uL (ref 0.1–0.9)
MONO%: 10.4 % (ref 0.0–14.0)
NEUT#: 1.8 10*3/uL (ref 1.5–6.5)
NEUT%: 63.3 % (ref 39.0–75.0)
Platelets: 240 10*3/uL (ref 140–400)
RBC: 2.34 10*6/uL — AB (ref 4.20–5.82)
RDW: 17.5 % — AB (ref 11.0–14.6)
WBC: 2.8 10*3/uL — ABNORMAL LOW (ref 4.0–10.3)
lymph#: 0.7 10*3/uL — ABNORMAL LOW (ref 0.9–3.3)

## 2015-11-17 MED ORDER — DARBEPOETIN ALFA 500 MCG/ML IJ SOSY
500.0000 ug | PREFILLED_SYRINGE | Freq: Once | INTRAMUSCULAR | Status: AC
  Administered 2015-11-17: 500 ug via SUBCUTANEOUS
  Filled 2015-11-17: qty 1

## 2015-11-17 NOTE — Patient Instructions (Signed)
Darbepoetin Alfa injection (Aranesp) What is this medicine? DARBEPOETIN ALFA (dar be POE e tin AL fa) helps your body make more red blood cells. It is used to treat anemia caused by chronic kidney failure and chemotherapy. This medicine may be used for other purposes; ask your health care provider or pharmacist if you have questions. What should I tell my health care provider before I take this medicine? They need to know if you have any of these conditions: -blood clotting disorders or history of blood clots -cancer patient not on chemotherapy -cystic fibrosis -heart disease, such as angina, heart failure, or a history of a heart attack -hemoglobin level of 12 g/dL or greater -high blood pressure -low levels of folate, iron, or vitamin B12 -seizures -an unusual or allergic reaction to darbepoetin, erythropoietin, albumin, hamster proteins, latex, other medicines, foods, dyes, or preservatives -pregnant or trying to get pregnant -breast-feeding How should I use this medicine? This medicine is for injection into a vein or under the skin. It is usually given by a health care professional in a hospital or clinic setting. If you get this medicine at home, you will be taught how to prepare and give this medicine. Do not shake the solution before you withdraw a dose. Use exactly as directed. Take your medicine at regular intervals. Do not take your medicine more often than directed. It is important that you put your used needles and syringes in a special sharps container. Do not put them in a trash can. If you do not have a sharps container, call your pharmacist or healthcare provider to get one. Talk to your pediatrician regarding the use of this medicine in children. While this medicine may be used in children as young as 1 year for selected conditions, precautions do apply. Overdosage: If you think you have taken too much of this medicine contact a poison control center or emergency room at  once. NOTE: This medicine is only for you. Do not share this medicine with others. What if I miss a dose? If you miss a dose, take it as soon as you can. If it is almost time for your next dose, take only that dose. Do not take double or extra doses. What may interact with this medicine? Do not take this medicine with any of the following medications: -epoetin alfa This list may not describe all possible interactions. Give your health care provider a list of all the medicines, herbs, non-prescription drugs, or dietary supplements you use. Also tell them if you smoke, drink alcohol, or use illegal drugs. Some items may interact with your medicine. What should I watch for while using this medicine? Visit your prescriber or health care professional for regular checks on your progress and for the needed blood tests and blood pressure measurements. It is especially important for the doctor to make sure your hemoglobin level is in the desired range, to limit the risk of potential side effects and to give you the best benefit. Keep all appointments for any recommended tests. Check your blood pressure as directed. Ask your doctor what your blood pressure should be and when you should contact him or her. As your body makes more red blood cells, you may need to take iron, folic acid, or vitamin B supplements. Ask your doctor or health care provider which products are right for you. If you have kidney disease continue dietary restrictions, even though this medication can make you feel better. Talk with your doctor or health care professional about  the foods you eat and the vitamins that you take. What side effects may I notice from receiving this medicine? Side effects that you should report to your doctor or health care professional as soon as possible: -allergic reactions like skin rash, itching or hives, swelling of the face, lips, or tongue -breathing problems -changes in vision -chest pain -confusion,  trouble speaking or understanding -feeling faint or lightheaded, falls -high blood pressure -muscle aches or pains -pain, swelling, warmth in the leg -rapid weight gain -severe headaches -sudden numbness or weakness of the face, arm or leg -trouble walking, dizziness, loss of balance or coordination -seizures (convulsions) -swelling of the ankles, feet, hands -unusually weak or tired Side effects that usually do not require medical attention (report to your doctor or health care professional if they continue or are bothersome): -diarrhea -fever, chills (flu-like symptoms) -headaches -nausea, vomiting -redness, stinging, or swelling at site where injected This list may not describe all possible side effects. Call your doctor for medical advice about side effects. You may report side effects to FDA at 1-800-FDA-1088. Where should I keep my medicine? Keep out of the reach of children. Store in a refrigerator between 2 and 8 degrees C (36 and 46 degrees F). Do not freeze. Do not shake. Throw away any unused portion if using a single-dose vial. Throw away any unused medicine after the expiration date. NOTE: This sheet is a summary. It may not cover all possible information. If you have questions about this medicine, talk to your doctor, pharmacist, or health care provider.    2016, [employer]vier/[employer] Standard. (2008-04-08 10:23:57)

## 2015-12-08 ENCOUNTER — Ambulatory Visit (HOSPITAL_BASED_OUTPATIENT_CLINIC_OR_DEPARTMENT_OTHER): Payer: Medicare Other

## 2015-12-08 ENCOUNTER — Other Ambulatory Visit (HOSPITAL_BASED_OUTPATIENT_CLINIC_OR_DEPARTMENT_OTHER): Payer: Medicare Other

## 2015-12-08 VITALS — BP 127/63 | HR 66 | Temp 97.4°F | Resp 18

## 2015-12-08 DIAGNOSIS — D4620 Refractory anemia with excess of blasts, unspecified: Secondary | ICD-10-CM | POA: Diagnosis not present

## 2015-12-08 DIAGNOSIS — C321 Malignant neoplasm of supraglottis: Secondary | ICD-10-CM

## 2015-12-08 LAB — CBC WITH DIFFERENTIAL/PLATELET
BASO%: 1.2 % (ref 0.0–2.0)
Basophils Absolute: 0 10*3/uL (ref 0.0–0.1)
EOS%: 0.9 % (ref 0.0–7.0)
Eosinophils Absolute: 0 10*3/uL (ref 0.0–0.5)
HEMATOCRIT: 26.6 % — AB (ref 38.4–49.9)
HGB: 8.7 g/dL — ABNORMAL LOW (ref 13.0–17.1)
LYMPH#: 0.7 10*3/uL — AB (ref 0.9–3.3)
LYMPH%: 25.6 % (ref 14.0–49.0)
MCH: 37.9 pg — ABNORMAL HIGH (ref 27.2–33.4)
MCHC: 32.7 g/dL (ref 32.0–36.0)
MCV: 116 fL — ABNORMAL HIGH (ref 79.3–98.0)
MONO#: 0.3 10*3/uL (ref 0.1–0.9)
MONO%: 10.8 % (ref 0.0–14.0)
NEUT%: 61.5 % (ref 39.0–75.0)
NEUTROS ABS: 1.6 10*3/uL (ref 1.5–6.5)
PLATELETS: 243 10*3/uL (ref 140–400)
RBC: 2.3 10*6/uL — AB (ref 4.20–5.82)
RDW: 17.6 % — ABNORMAL HIGH (ref 11.0–14.6)
WBC: 2.6 10*3/uL — AB (ref 4.0–10.3)

## 2015-12-08 MED ORDER — DARBEPOETIN ALFA 500 MCG/ML IJ SOSY
500.0000 ug | PREFILLED_SYRINGE | Freq: Once | INTRAMUSCULAR | Status: AC
  Administered 2015-12-08: 500 ug via SUBCUTANEOUS
  Filled 2015-12-08: qty 1

## 2015-12-08 NOTE — Patient Instructions (Signed)
Darbepoetin Alfa injection (Aranesp) What is this medicine? DARBEPOETIN ALFA (dar be POE e tin AL fa) helps your body make more red blood cells. It is used to treat anemia caused by chronic kidney failure and chemotherapy. This medicine may be used for other purposes; ask your health care provider or pharmacist if you have questions. What should I tell my health care provider before I take this medicine? They need to know if you have any of these conditions: -blood clotting disorders or history of blood clots -cancer patient not on chemotherapy -cystic fibrosis -heart disease, such as angina, heart failure, or a history of a heart attack -hemoglobin level of 12 g/dL or greater -high blood pressure -low levels of folate, iron, or vitamin B12 -seizures -an unusual or allergic reaction to darbepoetin, erythropoietin, albumin, hamster proteins, latex, other medicines, foods, dyes, or preservatives -pregnant or trying to get pregnant -breast-feeding How should I use this medicine? This medicine is for injection into a vein or under the skin. It is usually given by a health care professional in a hospital or clinic setting. If you get this medicine at home, you will be taught how to prepare and give this medicine. Do not shake the solution before you withdraw a dose. Use exactly as directed. Take your medicine at regular intervals. Do not take your medicine more often than directed. It is important that you put your used needles and syringes in a special sharps container. Do not put them in a trash can. If you do not have a sharps container, call your pharmacist or healthcare provider to get one. Talk to your pediatrician regarding the use of this medicine in children. While this medicine may be used in children as young as 1 year for selected conditions, precautions do apply. Overdosage: If you think you have taken too much of this medicine contact a poison control center or emergency room at  once. NOTE: This medicine is only for you. Do not share this medicine with others. What if I miss a dose? If you miss a dose, take it as soon as you can. If it is almost time for your next dose, take only that dose. Do not take double or extra doses. What may interact with this medicine? Do not take this medicine with any of the following medications: -epoetin alfa This list may not describe all possible interactions. Give your health care provider a list of all the medicines, herbs, non-prescription drugs, or dietary supplements you use. Also tell them if you smoke, drink alcohol, or use illegal drugs. Some items may interact with your medicine. What should I watch for while using this medicine? Visit your prescriber or health care professional for regular checks on your progress and for the needed blood tests and blood pressure measurements. It is especially important for the doctor to make sure your hemoglobin level is in the desired range, to limit the risk of potential side effects and to give you the best benefit. Keep all appointments for any recommended tests. Check your blood pressure as directed. Ask your doctor what your blood pressure should be and when you should contact him or her. As your body makes more red blood cells, you may need to take iron, folic acid, or vitamin B supplements. Ask your doctor or health care provider which products are right for you. If you have kidney disease continue dietary restrictions, even though this medication can make you feel better. Talk with your doctor or health care professional about  the foods you eat and the vitamins that you take. What side effects may I notice from receiving this medicine? Side effects that you should report to your doctor or health care professional as soon as possible: -allergic reactions like skin rash, itching or hives, swelling of the face, lips, or tongue -breathing problems -changes in vision -chest pain -confusion,  trouble speaking or understanding -feeling faint or lightheaded, falls -high blood pressure -muscle aches or pains -pain, swelling, warmth in the leg -rapid weight gain -severe headaches -sudden numbness or weakness of the face, arm or leg -trouble walking, dizziness, loss of balance or coordination -seizures (convulsions) -swelling of the ankles, feet, hands -unusually weak or tired Side effects that usually do not require medical attention (report to your doctor or health care professional if they continue or are bothersome): -diarrhea -fever, chills (flu-like symptoms) -headaches -nausea, vomiting -redness, stinging, or swelling at site where injected This list may not describe all possible side effects. Call your doctor for medical advice about side effects. You may report side effects to FDA at 1-800-FDA-1088. Where should I keep my medicine? Keep out of the reach of children. Store in a refrigerator between 2 and 8 degrees C (36 and 46 degrees F). Do not freeze. Do not shake. Throw away any unused portion if using a single-dose vial. Throw away any unused medicine after the expiration date. NOTE: This sheet is a summary. It may not cover all possible information. If you have questions about this medicine, talk to your doctor, pharmacist, or health care provider.    2016, [employer]vier/[employer] Standard. (2008-04-08 10:23:57)

## 2015-12-29 ENCOUNTER — Ambulatory Visit (HOSPITAL_BASED_OUTPATIENT_CLINIC_OR_DEPARTMENT_OTHER): Payer: Medicare Other

## 2015-12-29 ENCOUNTER — Other Ambulatory Visit (HOSPITAL_BASED_OUTPATIENT_CLINIC_OR_DEPARTMENT_OTHER): Payer: Medicare Other

## 2015-12-29 VITALS — BP 130/70 | HR 80 | Temp 98.0°F | Resp 20

## 2015-12-29 DIAGNOSIS — D4620 Refractory anemia with excess of blasts, unspecified: Secondary | ICD-10-CM

## 2015-12-29 DIAGNOSIS — C321 Malignant neoplasm of supraglottis: Secondary | ICD-10-CM

## 2015-12-29 LAB — CBC WITH DIFFERENTIAL/PLATELET
BASO%: 1 % (ref 0.0–2.0)
BASOS ABS: 0 10*3/uL (ref 0.0–0.1)
EOS ABS: 0.1 10*3/uL (ref 0.0–0.5)
EOS%: 3 % (ref 0.0–7.0)
HEMATOCRIT: 27.3 % — AB (ref 38.4–49.9)
HEMOGLOBIN: 8.8 g/dL — AB (ref 13.0–17.1)
LYMPH#: 0.8 10*3/uL — AB (ref 0.9–3.3)
LYMPH%: 28.1 % (ref 14.0–49.0)
MCH: 36.5 pg — AB (ref 27.2–33.4)
MCHC: 32.2 g/dL (ref 32.0–36.0)
MCV: 113.3 fL — AB (ref 79.3–98.0)
MONO#: 0.3 10*3/uL (ref 0.1–0.9)
MONO%: 10.7 % (ref 0.0–14.0)
NEUT%: 57.2 % (ref 39.0–75.0)
NEUTROS ABS: 1.7 10*3/uL (ref 1.5–6.5)
Platelets: 250 10*3/uL (ref 140–400)
RBC: 2.41 10*6/uL — ABNORMAL LOW (ref 4.20–5.82)
RDW: 16.9 % — AB (ref 11.0–14.6)
WBC: 3 10*3/uL — ABNORMAL LOW (ref 4.0–10.3)

## 2015-12-29 MED ORDER — DARBEPOETIN ALFA 500 MCG/ML IJ SOSY
500.0000 ug | PREFILLED_SYRINGE | Freq: Once | INTRAMUSCULAR | Status: AC
  Administered 2015-12-29: 500 ug via SUBCUTANEOUS
  Filled 2015-12-29: qty 1

## 2015-12-29 NOTE — Patient Instructions (Signed)
Darbepoetin Alfa injection (Aranesp) What is this medicine? DARBEPOETIN ALFA (dar be POE e tin AL fa) helps your body make more red blood cells. It is used to treat anemia caused by chronic kidney failure and chemotherapy. This medicine may be used for other purposes; ask your health care provider or pharmacist if you have questions. What should I tell my health care provider before I take this medicine? They need to know if you have any of these conditions: -blood clotting disorders or history of blood clots -cancer patient not on chemotherapy -cystic fibrosis -heart disease, such as angina, heart failure, or a history of a heart attack -hemoglobin level of 12 g/dL or greater -high blood pressure -low levels of folate, iron, or vitamin B12 -seizures -an unusual or allergic reaction to darbepoetin, erythropoietin, albumin, hamster proteins, latex, other medicines, foods, dyes, or preservatives -pregnant or trying to get pregnant -breast-feeding How should I use this medicine? This medicine is for injection into a vein or under the skin. It is usually given by a health care professional in a hospital or clinic setting. If you get this medicine at home, you will be taught how to prepare and give this medicine. Do not shake the solution before you withdraw a dose. Use exactly as directed. Take your medicine at regular intervals. Do not take your medicine more often than directed. It is important that you put your used needles and syringes in a special sharps container. Do not put them in a trash can. If you do not have a sharps container, call your pharmacist or healthcare provider to get one. Talk to your pediatrician regarding the use of this medicine in children. While this medicine may be used in children as young as 1 year for selected conditions, precautions do apply. Overdosage: If you think you have taken too much of this medicine contact a poison control center or emergency room at  once. NOTE: This medicine is only for you. Do not share this medicine with others. What if I miss a dose? If you miss a dose, take it as soon as you can. If it is almost time for your next dose, take only that dose. Do not take double or extra doses. What may interact with this medicine? Do not take this medicine with any of the following medications: -epoetin alfa This list may not describe all possible interactions. Give your health care provider a list of all the medicines, herbs, non-prescription drugs, or dietary supplements you use. Also tell them if you smoke, drink alcohol, or use illegal drugs. Some items may interact with your medicine. What should I watch for while using this medicine? Visit your prescriber or health care professional for regular checks on your progress and for the needed blood tests and blood pressure measurements. It is especially important for the doctor to make sure your hemoglobin level is in the desired range, to limit the risk of potential side effects and to give you the best benefit. Keep all appointments for any recommended tests. Check your blood pressure as directed. Ask your doctor what your blood pressure should be and when you should contact him or her. As your body makes more red blood cells, you may need to take iron, folic acid, or vitamin B supplements. Ask your doctor or health care provider which products are right for you. If you have kidney disease continue dietary restrictions, even though this medication can make you feel better. Talk with your doctor or health care professional about  the foods you eat and the vitamins that you take. What side effects may I notice from receiving this medicine? Side effects that you should report to your doctor or health care professional as soon as possible: -allergic reactions like skin rash, itching or hives, swelling of the face, lips, or tongue -breathing problems -changes in vision -chest pain -confusion,  trouble speaking or understanding -feeling faint or lightheaded, falls -high blood pressure -muscle aches or pains -pain, swelling, warmth in the leg -rapid weight gain -severe headaches -sudden numbness or weakness of the face, arm or leg -trouble walking, dizziness, loss of balance or coordination -seizures (convulsions) -swelling of the ankles, feet, hands -unusually weak or tired Side effects that usually do not require medical attention (report to your doctor or health care professional if they continue or are bothersome): -diarrhea -fever, chills (flu-like symptoms) -headaches -nausea, vomiting -redness, stinging, or swelling at site where injected This list may not describe all possible side effects. Call your doctor for medical advice about side effects. You may report side effects to FDA at 1-800-FDA-1088. Where should I keep my medicine? Keep out of the reach of children. Store in a refrigerator between 2 and 8 degrees C (36 and 46 degrees F). Do not freeze. Do not shake. Throw away any unused portion if using a single-dose vial. Throw away any unused medicine after the expiration date. NOTE: This sheet is a summary. It may not cover all possible information. If you have questions about this medicine, talk to your doctor, pharmacist, or health care provider.    2016, [employer]vier/[employer] Standard. (2008-04-08 10:23:57)

## 2015-12-31 DIAGNOSIS — C329 Malignant neoplasm of larynx, unspecified: Secondary | ICD-10-CM | POA: Diagnosis not present

## 2015-12-31 DIAGNOSIS — F17200 Nicotine dependence, unspecified, uncomplicated: Secondary | ICD-10-CM | POA: Diagnosis not present

## 2016-01-07 ENCOUNTER — Encounter: Payer: Self-pay | Admitting: Neurology

## 2016-01-07 ENCOUNTER — Ambulatory Visit (INDEPENDENT_AMBULATORY_CARE_PROVIDER_SITE_OTHER): Payer: Medicare Other | Admitting: Neurology

## 2016-01-07 VITALS — BP 99/66 | HR 64 | Ht 73.0 in | Wt 112.0 lb

## 2016-01-07 DIAGNOSIS — R569 Unspecified convulsions: Secondary | ICD-10-CM | POA: Diagnosis not present

## 2016-01-07 MED ORDER — LEVETIRACETAM 500 MG PO TABS: 500.0000 mg | ORAL_TABLET | Freq: Two times a day (BID) | ORAL | 11 refills | Status: DC

## 2016-01-07 NOTE — Patient Instructions (Signed)
I had a long discussion the patient with regards to his remote seizures which appear quite stable and has been seizure-free now for 6 years. Continue Keppra 500 mg twice daily is tolerating it well without side effects. He was given a refill to last year. His mild cognitive impairment also appears to be stable. I again encouraged him to increase participation in cognitively challenging activities like solving crossword puzzles, playing bridge and sudoku. He will return for follow-up in a year or call earlier if necessary.

## 2016-01-07 NOTE — Progress Notes (Signed)
[facility] [address] [place]. [place] [address] [phone]       OFFICE FOLLOW UP VISIT NOTE  [name] Date of Birth:  1950-08-22 Medical Record Number:  [id number]   Referring MD:  [name]  Reason for Referral:  seizure  HPI: 65 year male with history of seizures since 30 years which were apparently called alcohol related either due to intoxication or withdrawal. He has been on Dilantin since a long time and he could tell of previous stroke. He has myelodysplastic syndrome with anemia. It is suspected that chronic Dilantin usage may have contributed to his bone marrow suppression fan primary care physician has raised the question as to whether he can come off anticonvulsants since he has been seizure-free for a long time. He denies history of head injury, loss of consciousness, febrile illness for unprovoked seizures. He does not remember any unprovoked seizures which was nonl alcohol related. There is no family history of seizures or epilepsy. UPDATE 03/05/2014 : he returns for followup of her last visit 4 months ago. He has reduced her Dilantin to 300 mg daily without any breakthrough seizures. He continues to have a mild short term memory difficulties which appear to be unchanged. He still independent in most activities of daily living except he does not drive. He has not had any new neurological complaints. He underwent lab work on 12/25/13 which showed vitamin B12, TSH to be normal and RPR to be nonreactive. MRI scan of the brain personally reviewed by me done on 01/30/14 shows moderate changes of generalized cerebral atrophy and mild changes of chronic microvascular ischemia and paranasal sinusitis. EEG done on 12/25/13 personally reviewed by me is normal without any epileptiform features . Update 07/09/2014 ; he returns for follow-up after last visit 2 months ago. He is accompanied by his wife and stated that he had a generalized tonic-clonic seizure 2  weeks ago when he was on a tapering dose of Dilantin of 100 mg daily. He was seen in the ER where he was loaded with Dilantin but subsequently Dr. Jannifer Franklin changed him to Cool 500 twice daily as the plan was to discontinue Dilantin since hematologist felt that it was contributing to his myelodysplasia. Patient has noted some irritable mood and has not felt as alert and wife feels this may be effect of Keppra. However he has been noted for less than 2 weeks. He feels his memory difficulties are unchanged. He scored 23/30 on the Mini-Mental today. Update 01/07/2015 : He returns for follow-up after last visit 6 months ago accompanied by his wife. He is doing well without recurrent seizures now. He is tolerating Keppra better and his irritability and mood changes at the beginning have now settled down. He has had no breakthrough seizures. He had EEG done on 07/16/2014 which I personally reviewed and was normal. He states his memory and come to difficulties remain unchanged. He is independent in activities of daily living and does not need any help at home. He has no new complaints. He is not part sparing in any cognitively challenging activities. Update 01/07/2016 : He returns for follow-up after last visit a year ago. He continues to do well and has been seizure-free now since 2008. He starting Keppra well without dizziness or any side effects. He has had no new interval health problems. Continues to have mild short-term memory difficulties which are unchanged. He still independent in activities of daily living. He is not part sparing in regular activities  for mentally challenging tasks like playing bridge crossword puzzles. She has no new complaints today. ROS:   14 system review of systems is positive for memory loss seizure,   and all of the systems negative  PMH:  Past Medical History:  Diagnosis Date  . Anxiety   . Arthritis    rheumatoid  . Cancer ([place]) 03/11/2013   larnyx/epiglottic Squamous cell in situ   . Emphysema of lung ([place])   . Esophageal reflux   . History of blood transfusion   . History of radiation therapy 05/13/2013-06/28/2013   70 gray to epiglottis/neck  . Megaloblastic anemia   . Pneumonia 01/2013  . Seizures ([place])    last one was 8 years ago  . Shortness of breath    with exertion  . Throat pain 06/13/2013    Social History:  Social History   Social History  . Marital status: Married    Spouse name: Hassan Rowan  . Number of children: 3  . Years of education: 12th   Occupational History  . Retired Other   Social History Main Topics  . Smoking status: Current Some Day Smoker    Packs/day: 1.00    Years: 40.00    Types: Cigarettes  . Smokeless tobacco: Never Used     Comment: smoke 1 to 2 a day  . Alcohol use No  . Drug use: No  . Sexual activity: Not on file   Other Topics Concern  . Not on file   Social History Narrative   04/12/2013   The patient is married with 3 children. (2 girls and one boy)    The patient has a history of smoking a quarter pack per day for 15 years.   The patient is trying to quit on his own.   Patient has not had any alcohol for the past 6 months by report. Patient usually drinks vodka.   Caffeine Use: 2 cups daily             Medications:   Current Outpatient Prescriptions on File Prior to Visit  Medication Sig Dispense Refill  . cyanocobalamin 500 MCG tablet Take 500 mcg by mouth daily.    . diazepam (VALIUM) 5 MG tablet Take 10 mg by mouth every 6 (six) hours as needed for anxiety.     . ferrous sulfate 325 (65 FE) MG tablet Take 325 mg by mouth daily with breakfast.    . folic acid (FOLVITE) 1 MG tablet Take 1 mg by mouth daily.    . mometasone (NASONEX) 50 MCG/ACT nasal spray Place 2 sprays into both nostrils daily.    . vitamin E 1000 UNIT capsule Take 1,000 Units by mouth daily.      No current facility-administered medications on file prior to visit.     Allergies:  No Known Allergies  Physical Exam General: well  developed, well nourished middle-age African-American male, seated, in no evident distress Head: head normocephalic and atraumatic.     Neck: supple with no carotid or supraclavicular bruits Cardiovascular: regular rate and rhythm, no murmurs Musculoskeletal: no deformity Skin:  no rash/petichiae. Chronic fungus infection left hand fingernails with dystrophic nails Vascular:  Normal pulses all extremities Vitals:   01/07/16 1007  BP: 99/66  Pulse: 64    Neurologic Exam Mental Status: Awake and fully alert. Oriented to place and time. Recent and remote memory intact. Attention span, concentration and fund of knowledge appropriate. Mood and affect appropriate. Mini-Mental status exam not done today.Recall diminished 1/3.  Animal naming 8 only. Clock drawing 4/4. Cranial Nerves: Fundoscopic exam not done . Pupils equal, briskly reactive to light. Extraocular movements full without nystagmus. Visual fields full to confrontation. Hearing intact. Facial sensation intact. Face, tongue, palate moves normally and symmetrically.  Motor: Normal bulk and tone. Normal strength in all tested extremity muscles. Sensory.: intact to tough and pinprick and vibratory.  Coordination: Rapid alternating movements normal in all extremities. Finger-to-nose and heel-to-shin performed accurately bilaterally. Gait and Station: Arises from chair without difficulty. Stance is normal. Gait demonstrates normal stride length and balance . Able to heel, toe and tandem walk with mild  difficulty.  Reflexes: 1+ and symmetric. Toes downgoing.    ASSESSMENT: 29 year remote history of seizures since 30 years has been seizure-free since 2008. Most of the seizures appear to have been alcohol related.He also has a mild cognitive impairment which appears stable    PLAN: I had a long discussion the patient with regards to his remote seizures which appear quite stable and has been seizure-free now for 6 years. Continue Keppra 500  mg twice daily is tolerating it well without side effects. He was given a refill to last a year. His mild cognitive impairment also appears to be stable. I again encouraged him to increase participation in cognitively challenging activities like solving crossword puzzles, playing bridge and sudoku. He will return for follow-up in a year or call earlier if necessary.   Antony Contras, MD Note: This document was prepared with digital dictation and possible smart phrase technology. Any transcriptional errors that result from this process are unintentional.

## 2016-01-14 ENCOUNTER — Other Ambulatory Visit: Payer: Self-pay | Admitting: Neurology

## 2016-01-19 ENCOUNTER — Other Ambulatory Visit (HOSPITAL_BASED_OUTPATIENT_CLINIC_OR_DEPARTMENT_OTHER): Payer: Medicare Other

## 2016-01-19 ENCOUNTER — Ambulatory Visit (HOSPITAL_BASED_OUTPATIENT_CLINIC_OR_DEPARTMENT_OTHER): Payer: Medicare Other

## 2016-01-19 VITALS — BP 120/87 | HR 72 | Temp 98.2°F | Resp 22

## 2016-01-19 DIAGNOSIS — D4620 Refractory anemia with excess of blasts, unspecified: Secondary | ICD-10-CM

## 2016-01-19 DIAGNOSIS — C321 Malignant neoplasm of supraglottis: Secondary | ICD-10-CM

## 2016-01-19 LAB — CBC WITH DIFFERENTIAL/PLATELET
BASO%: 1.2 % (ref 0.0–2.0)
BASOS ABS: 0 10*3/uL (ref 0.0–0.1)
EOS%: 1.3 % (ref 0.0–7.0)
Eosinophils Absolute: 0 10*3/uL (ref 0.0–0.5)
HEMATOCRIT: 25.9 % — AB (ref 38.4–49.9)
HGB: 8.5 g/dL — ABNORMAL LOW (ref 13.0–17.1)
LYMPH%: 27.7 % (ref 14.0–49.0)
MCH: 37.7 pg — AB (ref 27.2–33.4)
MCHC: 32.7 g/dL (ref 32.0–36.0)
MCV: 115.3 fL — ABNORMAL HIGH (ref 79.3–98.0)
MONO#: 0.4 10*3/uL (ref 0.1–0.9)
MONO%: 12 % (ref 0.0–14.0)
NEUT#: 1.7 10*3/uL (ref 1.5–6.5)
NEUT%: 57.8 % (ref 39.0–75.0)
Platelets: 243 10*3/uL (ref 140–400)
RBC: 2.24 10*6/uL — ABNORMAL LOW (ref 4.20–5.82)
RDW: 17.9 % — ABNORMAL HIGH (ref 11.0–14.6)
WBC: 3 10*3/uL — ABNORMAL LOW (ref 4.0–10.3)
lymph#: 0.8 10*3/uL — ABNORMAL LOW (ref 0.9–3.3)

## 2016-01-19 MED ORDER — DARBEPOETIN ALFA 500 MCG/ML IJ SOSY
500.0000 ug | PREFILLED_SYRINGE | Freq: Once | INTRAMUSCULAR | Status: AC
  Administered 2016-01-19: 500 ug via SUBCUTANEOUS
  Filled 2016-01-19: qty 1

## 2016-01-19 NOTE — Patient Instructions (Signed)
Darbepoetin Alfa injection (Aranesp) What is this medicine? DARBEPOETIN ALFA (dar be POE e tin AL fa) helps your body make more red blood cells. It is used to treat anemia caused by chronic kidney failure and chemotherapy. This medicine may be used for other purposes; ask your health care provider or pharmacist if you have questions. What should I tell my health care provider before I take this medicine? They need to know if you have any of these conditions: -blood clotting disorders or history of blood clots -cancer patient not on chemotherapy -cystic fibrosis -heart disease, such as angina, heart failure, or a history of a heart attack -hemoglobin level of 12 g/dL or greater -high blood pressure -low levels of folate, iron, or vitamin B12 -seizures -an unusual or allergic reaction to darbepoetin, erythropoietin, albumin, hamster proteins, latex, other medicines, foods, dyes, or preservatives -pregnant or trying to get pregnant -breast-feeding How should I use this medicine? This medicine is for injection into a vein or under the skin. It is usually given by a health care professional in a hospital or clinic setting. If you get this medicine at home, you will be taught how to prepare and give this medicine. Do not shake the solution before you withdraw a dose. Use exactly as directed. Take your medicine at regular intervals. Do not take your medicine more often than directed. It is important that you put your used needles and syringes in a special sharps container. Do not put them in a trash can. If you do not have a sharps container, call your pharmacist or healthcare provider to get one. Talk to your pediatrician regarding the use of this medicine in children. While this medicine may be used in children as young as 1 year for selected conditions, precautions do apply. Overdosage: If you think you have taken too much of this medicine contact a poison control center or emergency room at  once. NOTE: This medicine is only for you. Do not share this medicine with others. What if I miss a dose? If you miss a dose, take it as soon as you can. If it is almost time for your next dose, take only that dose. Do not take double or extra doses. What may interact with this medicine? Do not take this medicine with any of the following medications: -epoetin alfa This list may not describe all possible interactions. Give your health care provider a list of all the medicines, herbs, non-prescription drugs, or dietary supplements you use. Also tell them if you smoke, drink alcohol, or use illegal drugs. Some items may interact with your medicine. What should I watch for while using this medicine? Visit your prescriber or health care professional for regular checks on your progress and for the needed blood tests and blood pressure measurements. It is especially important for the doctor to make sure your hemoglobin level is in the desired range, to limit the risk of potential side effects and to give you the best benefit. Keep all appointments for any recommended tests. Check your blood pressure as directed. Ask your doctor what your blood pressure should be and when you should contact him or her. As your body makes more red blood cells, you may need to take iron, folic acid, or vitamin B supplements. Ask your doctor or health care provider which products are right for you. If you have kidney disease continue dietary restrictions, even though this medication can make you feel better. Talk with your doctor or health care professional about  the foods you eat and the vitamins that you take. What side effects may I notice from receiving this medicine? Side effects that you should report to your doctor or health care professional as soon as possible: -allergic reactions like skin rash, itching or hives, swelling of the face, lips, or tongue -breathing problems -changes in vision -chest pain -confusion,  trouble speaking or understanding -feeling faint or lightheaded, falls -high blood pressure -muscle aches or pains -pain, swelling, warmth in the leg -rapid weight gain -severe headaches -sudden numbness or weakness of the face, arm or leg -trouble walking, dizziness, loss of balance or coordination -seizures (convulsions) -swelling of the ankles, feet, hands -unusually weak or tired Side effects that usually do not require medical attention (report to your doctor or health care professional if they continue or are bothersome): -diarrhea -fever, chills (flu-like symptoms) -headaches -nausea, vomiting -redness, stinging, or swelling at site where injected This list may not describe all possible side effects. Call your doctor for medical advice about side effects. You may report side effects to FDA at 1-800-FDA-1088. Where should I keep my medicine? Keep out of the reach of children. Store in a refrigerator between 2 and 8 degrees C (36 and 46 degrees F). Do not freeze. Do not shake. Throw away any unused portion if using a single-dose vial. Throw away any unused medicine after the expiration date. NOTE: This sheet is a summary. It may not cover all possible information. If you have questions about this medicine, talk to your doctor, pharmacist, or health care provider.    2016, [employer]vier/[employer] Standard. (2008-04-08 10:23:57)

## 2016-02-09 ENCOUNTER — Other Ambulatory Visit (HOSPITAL_BASED_OUTPATIENT_CLINIC_OR_DEPARTMENT_OTHER): Payer: Medicare Other

## 2016-02-09 ENCOUNTER — Ambulatory Visit (HOSPITAL_BASED_OUTPATIENT_CLINIC_OR_DEPARTMENT_OTHER): Payer: Medicare Other

## 2016-02-09 VITALS — BP 138/73 | HR 79 | Temp 97.8°F | Resp 20

## 2016-02-09 DIAGNOSIS — D4620 Refractory anemia with excess of blasts, unspecified: Secondary | ICD-10-CM | POA: Diagnosis not present

## 2016-02-09 DIAGNOSIS — C321 Malignant neoplasm of supraglottis: Secondary | ICD-10-CM

## 2016-02-09 LAB — CBC WITH DIFFERENTIAL/PLATELET
BASO%: 1.5 % (ref 0.0–2.0)
Basophils Absolute: 0 10*3/uL (ref 0.0–0.1)
EOS ABS: 0.1 10*3/uL (ref 0.0–0.5)
EOS%: 1.6 % (ref 0.0–7.0)
HCT: 27.6 % — ABNORMAL LOW (ref 38.4–49.9)
HEMOGLOBIN: 9.1 g/dL — AB (ref 13.0–17.1)
LYMPH%: 25.7 % (ref 14.0–49.0)
MCH: 38 pg — ABNORMAL HIGH (ref 27.2–33.4)
MCHC: 33 g/dL (ref 32.0–36.0)
MCV: 115.3 fL — AB (ref 79.3–98.0)
MONO#: 0.3 10*3/uL (ref 0.1–0.9)
MONO%: 11 % (ref 0.0–14.0)
NEUT#: 1.8 10*3/uL (ref 1.5–6.5)
NEUT%: 60.2 % (ref 39.0–75.0)
PLATELETS: 274 10*3/uL (ref 140–400)
RBC: 2.39 10*6/uL — AB (ref 4.20–5.82)
RDW: 17.3 % — AB (ref 11.0–14.6)
WBC: 3.1 10*3/uL — AB (ref 4.0–10.3)
lymph#: 0.8 10*3/uL — ABNORMAL LOW (ref 0.9–3.3)

## 2016-02-09 MED ORDER — DARBEPOETIN ALFA 500 MCG/ML IJ SOSY
500.0000 ug | PREFILLED_SYRINGE | Freq: Once | INTRAMUSCULAR | Status: AC
  Administered 2016-02-09: 500 ug via SUBCUTANEOUS
  Filled 2016-02-09: qty 1

## 2016-02-09 NOTE — Patient Instructions (Signed)
Darbepoetin Alfa injection (Aranesp) What is this medicine? DARBEPOETIN ALFA (dar be POE e tin AL fa) helps your body make more red blood cells. It is used to treat anemia caused by chronic kidney failure and chemotherapy. This medicine may be used for other purposes; ask your health care provider or pharmacist if you have questions. What should I tell my health care provider before I take this medicine? They need to know if you have any of these conditions: -blood clotting disorders or history of blood clots -cancer patient not on chemotherapy -cystic fibrosis -heart disease, such as angina, heart failure, or a history of a heart attack -hemoglobin level of 12 g/dL or greater -high blood pressure -low levels of folate, iron, or vitamin B12 -seizures -an unusual or allergic reaction to darbepoetin, erythropoietin, albumin, hamster proteins, latex, other medicines, foods, dyes, or preservatives -pregnant or trying to get pregnant -breast-feeding How should I use this medicine? This medicine is for injection into a vein or under the skin. It is usually given by a health care professional in a hospital or clinic setting. If you get this medicine at home, you will be taught how to prepare and give this medicine. Do not shake the solution before you withdraw a dose. Use exactly as directed. Take your medicine at regular intervals. Do not take your medicine more often than directed. It is important that you put your used needles and syringes in a special sharps container. Do not put them in a trash can. If you do not have a sharps container, call your pharmacist or healthcare provider to get one. Talk to your pediatrician regarding the use of this medicine in children. While this medicine may be used in children as young as 1 year for selected conditions, precautions do apply. Overdosage: If you think you have taken too much of this medicine contact a poison control center or emergency room at  once. NOTE: This medicine is only for you. Do not share this medicine with others. What if I miss a dose? If you miss a dose, take it as soon as you can. If it is almost time for your next dose, take only that dose. Do not take double or extra doses. What may interact with this medicine? Do not take this medicine with any of the following medications: -epoetin alfa This list may not describe all possible interactions. Give your health care provider a list of all the medicines, herbs, non-prescription drugs, or dietary supplements you use. Also tell them if you smoke, drink alcohol, or use illegal drugs. Some items may interact with your medicine. What should I watch for while using this medicine? Visit your prescriber or health care professional for regular checks on your progress and for the needed blood tests and blood pressure measurements. It is especially important for the doctor to make sure your hemoglobin level is in the desired range, to limit the risk of potential side effects and to give you the best benefit. Keep all appointments for any recommended tests. Check your blood pressure as directed. Ask your doctor what your blood pressure should be and when you should contact him or her. As your body makes more red blood cells, you may need to take iron, folic acid, or vitamin B supplements. Ask your doctor or health care provider which products are right for you. If you have kidney disease continue dietary restrictions, even though this medication can make you feel better. Talk with your doctor or health care professional about  the foods you eat and the vitamins that you take. What side effects may I notice from receiving this medicine? Side effects that you should report to your doctor or health care professional as soon as possible: -allergic reactions like skin rash, itching or hives, swelling of the face, lips, or tongue -breathing problems -changes in vision -chest pain -confusion,  trouble speaking or understanding -feeling faint or lightheaded, falls -high blood pressure -muscle aches or pains -pain, swelling, warmth in the leg -rapid weight gain -severe headaches -sudden numbness or weakness of the face, arm or leg -trouble walking, dizziness, loss of balance or coordination -seizures (convulsions) -swelling of the ankles, feet, hands -unusually weak or tired Side effects that usually do not require medical attention (report to your doctor or health care professional if they continue or are bothersome): -diarrhea -fever, chills (flu-like symptoms) -headaches -nausea, vomiting -redness, stinging, or swelling at site where injected This list may not describe all possible side effects. Call your doctor for medical advice about side effects. You may report side effects to FDA at 1-800-FDA-1088. Where should I keep my medicine? Keep out of the reach of children. Store in a refrigerator between 2 and 8 degrees C (36 and 46 degrees F). Do not freeze. Do not shake. Throw away any unused portion if using a single-dose vial. Throw away any unused medicine after the expiration date. NOTE: This sheet is a summary. It may not cover all possible information. If you have questions about this medicine, talk to your doctor, pharmacist, or health care provider.    2016, [employer]vier/[employer] Standard. (2008-04-08 10:23:57)

## 2016-02-15 ENCOUNTER — Telehealth: Payer: Self-pay | Admitting: *Deleted

## 2016-02-15 NOTE — Telephone Encounter (Signed)
I spoke with the patient's wife. She informed me that he is in the process of transitioning his care to the [place] to receive darbepoetin injection for diagnosis of MDS Recently, I have asked my nurse to fax over records for the [place] review. The patient is being followed here for 2 reasons: 1) MDS of which he is receiving darbepoetin every 3 weeks 2) laryngeal cancer status post radiation treatment His wife is made aware that the patient will need follow-up with ENT for problem #2 After much discussion, we plan to keep the appointment on October 24 pending transition of care to the [place]. If his transfer of care is successful, his wife will call us and we will cancel his appointment then

## 2016-02-15 NOTE — Telephone Encounter (Addendum)
Call received @ 1045 from pts. wife Pleasant Bensinger,  had questions for Dr.Gorsuch before she CAN. Any upcoming appts. Message was left to have her call returned.

## 2016-02-16 ENCOUNTER — Other Ambulatory Visit: Payer: Self-pay | Admitting: Hematology and Oncology

## 2016-02-16 DIAGNOSIS — Z8589 Personal history of malignant neoplasm of other organs and systems: Secondary | ICD-10-CM

## 2016-03-01 ENCOUNTER — Encounter: Payer: Self-pay | Admitting: Hematology and Oncology

## 2016-03-01 ENCOUNTER — Telehealth: Payer: Self-pay | Admitting: Hematology and Oncology

## 2016-03-01 ENCOUNTER — Other Ambulatory Visit (HOSPITAL_BASED_OUTPATIENT_CLINIC_OR_DEPARTMENT_OTHER): Payer: Medicare Other

## 2016-03-01 ENCOUNTER — Ambulatory Visit (HOSPITAL_BASED_OUTPATIENT_CLINIC_OR_DEPARTMENT_OTHER): Payer: Medicare Other

## 2016-03-01 ENCOUNTER — Ambulatory Visit (HOSPITAL_BASED_OUTPATIENT_CLINIC_OR_DEPARTMENT_OTHER): Payer: Medicare Other | Admitting: Hematology and Oncology

## 2016-03-01 VITALS — BP 145/76 | HR 72 | Temp 98.2°F | Resp 16 | Ht 73.0 in | Wt 116.8 lb

## 2016-03-01 DIAGNOSIS — C321 Malignant neoplasm of supraglottis: Secondary | ICD-10-CM

## 2016-03-01 DIAGNOSIS — Z720 Tobacco use: Secondary | ICD-10-CM | POA: Diagnosis not present

## 2016-03-01 DIAGNOSIS — Z8589 Personal history of malignant neoplasm of other organs and systems: Secondary | ICD-10-CM

## 2016-03-01 DIAGNOSIS — D4620 Refractory anemia with excess of blasts, unspecified: Secondary | ICD-10-CM

## 2016-03-01 LAB — CBC WITH DIFFERENTIAL/PLATELET
BASO%: 1 % (ref 0.0–2.0)
BASOS ABS: 0 10*3/uL (ref 0.0–0.1)
EOS%: 1.7 % (ref 0.0–7.0)
Eosinophils Absolute: 0.1 10*3/uL (ref 0.0–0.5)
HCT: 27.1 % — ABNORMAL LOW (ref 38.4–49.9)
HGB: 8.8 g/dL — ABNORMAL LOW (ref 13.0–17.1)
LYMPH%: 25 % (ref 14.0–49.0)
MCH: 37.9 pg — AB (ref 27.2–33.4)
MCHC: 32.5 g/dL (ref 32.0–36.0)
MCV: 116.7 fL — AB (ref 79.3–98.0)
MONO#: 0.4 10*3/uL (ref 0.1–0.9)
MONO%: 13 % (ref 0.0–14.0)
NEUT#: 1.9 10*3/uL (ref 1.5–6.5)
NEUT%: 59.3 % (ref 39.0–75.0)
PLATELETS: 247 10*3/uL (ref 140–400)
RBC: 2.32 10*6/uL — AB (ref 4.20–5.82)
RDW: 17.9 % — ABNORMAL HIGH (ref 11.0–14.6)
WBC: 3.2 10*3/uL — ABNORMAL LOW (ref 4.0–10.3)
lymph#: 0.8 10*3/uL — ABNORMAL LOW (ref 0.9–3.3)

## 2016-03-01 MED ORDER — DARBEPOETIN ALFA 500 MCG/ML IJ SOSY
500.0000 ug | PREFILLED_SYRINGE | Freq: Once | INTRAMUSCULAR | Status: AC
  Administered 2016-03-01: 500 ug via SUBCUTANEOUS
  Filled 2016-03-01: qty 1

## 2016-03-01 NOTE — Progress Notes (Signed)
[facility] OFFICE PROGRESS NOTE  Patient Care Team: [name], MD as PCP - General (Family Medicine) [name], MD as Consulting Physician (Hematology and Oncology) Leota Sauers, RN as Registered Nurse (Oncology)  SUMMARY OF ONCOLOGIC HISTORY: Oncology History   MDS, R-IPSS score of 3, low risk (Hg 8.9, +16 chromosome on BM, 2% blast count)   Squamous cell carcinoma of the epiglottis   Primary site: Larynx - Supraglottis (Left)   Staging method: AJCC 7th Edition   Clinical: Stage I (T1, N0, M0) signed by [name], MD on 04/30/2013 12:49 PM   Pathologic: Stage I (T1, N0, cM0) signed by [name], MD on 04/30/2013 12:49 PM   Summary: Stage I (T1, N0, cM0)       MDS (myelodysplastic syndrome), low grade ([place])   02/01/2007 Bone Marrow Biopsy    BM biopsy was abnormal, overall probable low grade MDS      03/23/2011 - 02/06/2013 Chemotherapy    [name], discontinued due to diagnosis of laryngeal ca      09/06/2013 Bone Marrow Biopsy    Repeat bone marrow aspirate and biopsy confirmed low-grade myelodysplastic syndrome.      09/17/2013 -  Chemotherapy    The patient resumed darbepoetin injections to treat the anemia.       History of head and neck cancer   03/06/2013 Procedure    Biopsy from epiglottic region showed invasive [facility]      04/25/2013 Imaging    Ct scan showed no other involvement in the LN      05/13/2013 - 06/28/2013 Radiation Therapy    He received radiation therapy, 70 gray in 35 fractions to the larynx       INTERVAL HISTORY: Please see below for problem oriented charting. He returns for further follow-up with his wife. He feels well. Denies changes in his voice. He is following up with ENT on a regular basis. No recent hemoptysis. The patient continues to smoke several cigarettes per day. He is in the process of transferring his care to [place] to get darbepoetin injection there. The patient denies any recent  signs or symptoms of bleeding such as spontaneous epistaxis, hematuria or hematochezia.   REVIEW OF SYSTEMS:   Constitutional: Denies fevers, chills or abnormal weight loss Eyes: Denies blurriness of vision Ears, nose, mouth, throat, and face: Denies mucositis or sore throat Respiratory: Denies cough, dyspnea or wheezes Cardiovascular: Denies palpitation, chest discomfort or lower extremity swelling Gastrointestinal:  Denies nausea, heartburn or change in bowel habits Skin: Denies abnormal skin rashes Lymphatics: Denies new lymphadenopathy or easy bruising Neurological:Denies numbness, tingling or new weaknesses Behavioral/Psych: Mood is stable, no new changes  All other systems were reviewed with the patient and are negative.  I have reviewed the past medical history, past surgical history, social history and family history with the patient and they are unchanged from previous note.  ALLERGIES:  has No Known Allergies.  MEDICATIONS:  Current Outpatient Prescriptions  Medication Sig Dispense Refill  . cyanocobalamin 500 MCG tablet Take 500 mcg by mouth daily.    . diazepam (VALIUM) 5 MG tablet Take 10 mg by mouth every 6 (six) hours as needed for anxiety.     . ferrous sulfate 325 (65 FE) MG tablet Take 325 mg by mouth daily with breakfast.    . folic acid (FOLVITE) 1 MG tablet Take 1 mg by mouth daily.    Marland Kitchen levETIRAcetam (KEPPRA) 500 MG tablet Take 1 tablet (500 mg  total) by mouth 2 (two) times daily. 60 tablet 11  . mometasone (NASONEX) 50 MCG/ACT nasal spray Place 2 sprays into both nostrils daily.    . vitamin E 1000 UNIT capsule Take 1,000 Units by mouth daily.      No current facility-administered medications for this visit.     PHYSICAL EXAMINATION: ECOG PERFORMANCE STATUS: 0 - Asymptomatic  Vitals:   03/01/16 1202  BP: (!) 145/76  Pulse: 72  Resp: 16  Temp: 98.2 F (36.8 C)   Filed Weights   03/01/16 1202  Weight: 116 lb 12.8 oz (53 kg)    GENERAL:alert, no  distress and comfortable. He looks thin SKIN: skin color, texture, turgor are normal, no rashes or significant lesions EYES: normal, Conjunctiva are pink and non-injected, sclera clear OROPHARYNX:no exudate, no erythema and lips, buccal mucosa, and tongue normal  NECK: supple, thyroid normal size, non-tender, without nodularity LYMPH:  no palpable lymphadenopathy in the cervical, axillary or inguinal LUNGS: clear to auscultation and percussion with normal breathing effort HEART: regular rate & rhythm and no murmurs and no lower extremity edema ABDOMEN:abdomen soft, non-tender and normal bowel sounds Musculoskeletal:no cyanosis of digits and no clubbing  NEURO: alert & oriented x 3 with fluent speech, no focal motor/sensory deficits  LABORATORY DATA:  I have reviewed the data as listed    Component Value Date/Time   NA 139 06/21/2014 0521   NA 139 09/17/2013 1158   K 3.7 06/21/2014 0521   K 4.1 09/17/2013 1158   CL 106 06/21/2014 0521   CL 107 09/19/2012 1325   CO2 25 06/21/2014 0521   CO2 20 (L) 09/17/2013 1158   GLUCOSE 105 (H) 06/21/2014 0521   GLUCOSE 77 09/17/2013 1158   GLUCOSE 97 09/19/2012 1325   BUN 19 06/21/2014 0521   BUN 17.4 09/17/2013 1158   CREATININE 0.83 06/21/2014 0521   CREATININE 0.9 09/17/2013 1158   CALCIUM 8.6 06/21/2014 0521   CALCIUM 9.3 09/17/2013 1158   PROT 8.5 (H) 06/21/2014 0521   PROT 8.5 (H) 09/17/2013 1158   ALBUMIN 3.4 (L) 06/21/2014 0521   ALBUMIN 3.5 09/17/2013 1158   AST 15 06/21/2014 0521   AST 10 09/17/2013 1158   ALT 8 06/21/2014 0521   ALT <6 09/17/2013 1158   ALKPHOS 129 (H) 06/21/2014 0521   ALKPHOS 151 (H) 09/17/2013 1158   BILITOT 0.3 06/21/2014 0521   BILITOT 0.23 09/17/2013 1158   GFRNONAA >90 06/21/2014 0521   GFRAA >90 06/21/2014 0521    No results found for: SPEP, UPEP  Lab Results  Component Value Date   WBC 3.2 (L) 03/01/2016   NEUTROABS 1.9 03/01/2016   HGB 8.8 (L) 03/01/2016   HCT 27.1 (L) 03/01/2016   MCV  116.7 (H) 03/01/2016   PLT 247 03/01/2016      Chemistry      Component Value Date/Time   NA 139 06/21/2014 0521   NA 139 09/17/2013 1158   K 3.7 06/21/2014 0521   K 4.1 09/17/2013 1158   CL 106 06/21/2014 0521   CL 107 09/19/2012 1325   CO2 25 06/21/2014 0521   CO2 20 (L) 09/17/2013 1158   BUN 19 06/21/2014 0521   BUN 17.4 09/17/2013 1158   CREATININE 0.83 06/21/2014 0521   CREATININE 0.9 09/17/2013 1158      Component Value Date/Time   CALCIUM 8.6 06/21/2014 0521   CALCIUM 9.3 09/17/2013 1158   ALKPHOS 129 (H) 06/21/2014 0521   ALKPHOS 151 (H) 09/17/2013 1158  AST 15 06/21/2014 0521   AST 10 09/17/2013 1158   ALT 8 06/21/2014 0521   ALT <6 09/17/2013 1158   BILITOT 0.3 06/21/2014 0521   BILITOT 0.23 09/17/2013 1158      ASSESSMENT & PLAN:  History of head and neck cancer Clinically, he has no signs of recurrence. He will continue close follow-up with ENT. I recommend the patient to quit smoking.  MDS (myelodysplastic syndrome), low grade The anemia from MDS is stable. I suspect he may have some degree of bone marrow suppression from his antiseizure medications. He had responded well to Darbopeitin in the past with improvement in energy level. The patient will return every 3 weeks to get blood work checked and he will get injection to keep hemoglobin greater than 10 g. We will recheck serum B-12, iron studies and erythropoietin level in November The patient and his wife inform me that he may be transferring his care to the [place]. Until then, he will continue to come back here for treatment and I will see him back in 6 months  Tobacco abuse I spent some time counseling the patient the importance of tobacco cessation. he is currently attempting to quit on his own    Orders Placed This Encounter  Procedures  . Erythropoietin    Standing Status:   Future    Standing Expiration Date:   04/05/2017  . Ferritin    Standing Status:   Future    Standing Expiration  Date:   04/05/2017  . Vitamin B12    Standing Status:   Future    Standing Expiration Date:   04/05/2017  . Iron and TIBC    Standing Status:   Future    Standing Expiration Date:   04/05/2017   All questions were answered. The patient knows to call the clinic with any problems, questions or concerns. No barriers to learning was detected. I spent 15 minutes counseling the patient face to face. The total time spent in the appointment was 20 minutes and more than 50% was on counseling and review of test results     [name], MD 03/01/2016 1:29 PM

## 2016-03-01 NOTE — Patient Instructions (Signed)
Darbepoetin Alfa injection (Aranesp) What is this medicine? DARBEPOETIN ALFA (dar be POE e tin AL fa) helps your body make more red blood cells. It is used to treat anemia caused by chronic kidney failure and chemotherapy. This medicine may be used for other purposes; ask your health care provider or pharmacist if you have questions. What should I tell my health care provider before I take this medicine? They need to know if you have any of these conditions: -blood clotting disorders or history of blood clots -cancer patient not on chemotherapy -cystic fibrosis -heart disease, such as angina, heart failure, or a history of a heart attack -hemoglobin level of 12 g/dL or greater -high blood pressure -low levels of folate, iron, or vitamin B12 -seizures -an unusual or allergic reaction to darbepoetin, erythropoietin, albumin, hamster proteins, latex, other medicines, foods, dyes, or preservatives -pregnant or trying to get pregnant -breast-feeding How should I use this medicine? This medicine is for injection into a vein or under the skin. It is usually given by a health care professional in a hospital or clinic setting. If you get this medicine at home, you will be taught how to prepare and give this medicine. Do not shake the solution before you withdraw a dose. Use exactly as directed. Take your medicine at regular intervals. Do not take your medicine more often than directed. It is important that you put your used needles and syringes in a special sharps container. Do not put them in a trash can. If you do not have a sharps container, call your pharmacist or healthcare provider to get one. Talk to your pediatrician regarding the use of this medicine in children. While this medicine may be used in children as young as 1 year for selected conditions, precautions do apply. Overdosage: If you think you have taken too much of this medicine contact a poison control center or emergency room at  once. NOTE: This medicine is only for you. Do not share this medicine with others. What if I miss a dose? If you miss a dose, take it as soon as you can. If it is almost time for your next dose, take only that dose. Do not take double or extra doses. What may interact with this medicine? Do not take this medicine with any of the following medications: -epoetin alfa This list may not describe all possible interactions. Give your health care provider a list of all the medicines, herbs, non-prescription drugs, or dietary supplements you use. Also tell them if you smoke, drink alcohol, or use illegal drugs. Some items may interact with your medicine. What should I watch for while using this medicine? Visit your prescriber or health care professional for regular checks on your progress and for the needed blood tests and blood pressure measurements. It is especially important for the doctor to make sure your hemoglobin level is in the desired range, to limit the risk of potential side effects and to give you the best benefit. Keep all appointments for any recommended tests. Check your blood pressure as directed. Ask your doctor what your blood pressure should be and when you should contact him or her. As your body makes more red blood cells, you may need to take iron, folic acid, or vitamin B supplements. Ask your doctor or health care provider which products are right for you. If you have kidney disease continue dietary restrictions, even though this medication can make you feel better. Talk with your doctor or health care professional about  the foods you eat and the vitamins that you take. What side effects may I notice from receiving this medicine? Side effects that you should report to your doctor or health care professional as soon as possible: -allergic reactions like skin rash, itching or hives, swelling of the face, lips, or tongue -breathing problems -changes in vision -chest pain -confusion,  trouble speaking or understanding -feeling faint or lightheaded, falls -high blood pressure -muscle aches or pains -pain, swelling, warmth in the leg -rapid weight gain -severe headaches -sudden numbness or weakness of the face, arm or leg -trouble walking, dizziness, loss of balance or coordination -seizures (convulsions) -swelling of the ankles, feet, hands -unusually weak or tired Side effects that usually do not require medical attention (report to your doctor or health care professional if they continue or are bothersome): -diarrhea -fever, chills (flu-like symptoms) -headaches -nausea, vomiting -redness, stinging, or swelling at site where injected This list may not describe all possible side effects. Call your doctor for medical advice about side effects. You may report side effects to FDA at 1-800-FDA-1088. Where should I keep my medicine? Keep out of the reach of children. Store in a refrigerator between 2 and 8 degrees C (36 and 46 degrees F). Do not freeze. Do not shake. Throw away any unused portion if using a single-dose vial. Throw away any unused medicine after the expiration date. NOTE: This sheet is a summary. It may not cover all possible information. If you have questions about this medicine, talk to your doctor, pharmacist, or health care provider.    2016, [employer]vier/[employer] Standard. (2008-04-08 10:23:57)

## 2016-03-01 NOTE — Telephone Encounter (Signed)
Labs and injections scheduled q 3 weeks x 6 weeks, per 03/01/16 los. Follow up appointment scheduled per 03/01/16 los.  AVS report and appointment schedule given to patient, per 03/01/16 los.

## 2016-03-01 NOTE — Assessment & Plan Note (Signed)
I spent some time counseling the patient the importance of tobacco cessation. he is currently attempting to quit on his own 

## 2016-03-01 NOTE — Assessment & Plan Note (Signed)
The anemia from MDS is stable. I suspect he may have some degree of bone marrow suppression from his antiseizure medications. He had responded well to Darbopeitin in the past with improvement in energy level. The patient will return every 3 weeks to get blood work checked and he will get injection to keep hemoglobin greater than 10 g. We will recheck serum B-12, iron studies and erythropoietin level in November The patient and his wife inform me that he may be transferring his care to the [place]. Until then, he will continue to come back here for treatment and I will see him back in 6 months

## 2016-03-01 NOTE — Assessment & Plan Note (Signed)
Clinically, he has no signs of recurrence. He will continue close follow-up with ENT. I recommend the patient to quit smoking. 

## 2016-03-22 ENCOUNTER — Ambulatory Visit (HOSPITAL_BASED_OUTPATIENT_CLINIC_OR_DEPARTMENT_OTHER): Payer: Medicare Other

## 2016-03-22 ENCOUNTER — Other Ambulatory Visit (HOSPITAL_BASED_OUTPATIENT_CLINIC_OR_DEPARTMENT_OTHER): Payer: Medicare Other

## 2016-03-22 VITALS — BP 137/76 | HR 85 | Temp 97.8°F | Resp 18

## 2016-03-22 DIAGNOSIS — D4620 Refractory anemia with excess of blasts, unspecified: Secondary | ICD-10-CM

## 2016-03-22 DIAGNOSIS — C321 Malignant neoplasm of supraglottis: Secondary | ICD-10-CM

## 2016-03-22 LAB — CBC WITH DIFFERENTIAL/PLATELET
BASO%: 0.9 % (ref 0.0–2.0)
Basophils Absolute: 0 10*3/uL (ref 0.0–0.1)
EOS%: 1.8 % (ref 0.0–7.0)
Eosinophils Absolute: 0.1 10*3/uL (ref 0.0–0.5)
HCT: 28.1 % — ABNORMAL LOW (ref 38.4–49.9)
HEMOGLOBIN: 9.1 g/dL — AB (ref 13.0–17.1)
LYMPH%: 23.2 % (ref 14.0–49.0)
MCH: 37.4 pg — AB (ref 27.2–33.4)
MCHC: 32.4 g/dL (ref 32.0–36.0)
MCV: 115.6 fL — ABNORMAL HIGH (ref 79.3–98.0)
MONO#: 0.3 10*3/uL (ref 0.1–0.9)
MONO%: 9.9 % (ref 0.0–14.0)
NEUT%: 64.2 % (ref 39.0–75.0)
NEUTROS ABS: 2.1 10*3/uL (ref 1.5–6.5)
Platelets: 263 10*3/uL (ref 140–400)
RBC: 2.43 10*6/uL — ABNORMAL LOW (ref 4.20–5.82)
RDW: 17 % — AB (ref 11.0–14.6)
WBC: 3.3 10*3/uL — AB (ref 4.0–10.3)
lymph#: 0.8 10*3/uL — ABNORMAL LOW (ref 0.9–3.3)

## 2016-03-22 LAB — IRON AND TIBC
%SAT: 35 % (ref 20–55)
IRON: 63 ug/dL (ref 42–163)
TIBC: 181 ug/dL — AB (ref 202–409)
UIBC: 118 ug/dL (ref 117–376)

## 2016-03-22 LAB — FERRITIN: FERRITIN: 296 ng/mL (ref 22–316)

## 2016-03-22 MED ORDER — DARBEPOETIN ALFA 500 MCG/ML IJ SOSY
500.0000 ug | PREFILLED_SYRINGE | Freq: Once | INTRAMUSCULAR | Status: AC
  Administered 2016-03-22: 500 ug via SUBCUTANEOUS
  Filled 2016-03-22: qty 1

## 2016-03-22 NOTE — Patient Instructions (Signed)
Darbepoetin Alfa injection (Aranesp) What is this medicine? DARBEPOETIN ALFA (dar be POE e tin AL fa) helps your body make more red blood cells. It is used to treat anemia caused by chronic kidney failure and chemotherapy. This medicine may be used for other purposes; ask your health care provider or pharmacist if you have questions. What should I tell my health care provider before I take this medicine? They need to know if you have any of these conditions: -blood clotting disorders or history of blood clots -cancer patient not on chemotherapy -cystic fibrosis -heart disease, such as angina, heart failure, or a history of a heart attack -hemoglobin level of 12 g/dL or greater -high blood pressure -low levels of folate, iron, or vitamin B12 -seizures -an unusual or allergic reaction to darbepoetin, erythropoietin, albumin, hamster proteins, latex, other medicines, foods, dyes, or preservatives -pregnant or trying to get pregnant -breast-feeding How should I use this medicine? This medicine is for injection into a vein or under the skin. It is usually given by a health care professional in a hospital or clinic setting. If you get this medicine at home, you will be taught how to prepare and give this medicine. Do not shake the solution before you withdraw a dose. Use exactly as directed. Take your medicine at regular intervals. Do not take your medicine more often than directed. It is important that you put your used needles and syringes in a special sharps container. Do not put them in a trash can. If you do not have a sharps container, call your pharmacist or healthcare provider to get one. Talk to your pediatrician regarding the use of this medicine in children. While this medicine may be used in children as young as 1 year for selected conditions, precautions do apply. Overdosage: If you think you have taken too much of this medicine contact a poison control center or emergency room at  once. NOTE: This medicine is only for you. Do not share this medicine with others. What if I miss a dose? If you miss a dose, take it as soon as you can. If it is almost time for your next dose, take only that dose. Do not take double or extra doses. What may interact with this medicine? Do not take this medicine with any of the following medications: -epoetin alfa This list may not describe all possible interactions. Give your health care provider a list of all the medicines, herbs, non-prescription drugs, or dietary supplements you use. Also tell them if you smoke, drink alcohol, or use illegal drugs. Some items may interact with your medicine. What should I watch for while using this medicine? Visit your prescriber or health care professional for regular checks on your progress and for the needed blood tests and blood pressure measurements. It is especially important for the doctor to make sure your hemoglobin level is in the desired range, to limit the risk of potential side effects and to give you the best benefit. Keep all appointments for any recommended tests. Check your blood pressure as directed. Ask your doctor what your blood pressure should be and when you should contact him or her. As your body makes more red blood cells, you may need to take iron, folic acid, or vitamin B supplements. Ask your doctor or health care provider which products are right for you. If you have kidney disease continue dietary restrictions, even though this medication can make you feel better. Talk with your doctor or health care professional about  the foods you eat and the vitamins that you take. What side effects may I notice from receiving this medicine? Side effects that you should report to your doctor or health care professional as soon as possible: -allergic reactions like skin rash, itching or hives, swelling of the face, lips, or tongue -breathing problems -changes in vision -chest pain -confusion,  trouble speaking or understanding -feeling faint or lightheaded, falls -high blood pressure -muscle aches or pains -pain, swelling, warmth in the leg -rapid weight gain -severe headaches -sudden numbness or weakness of the face, arm or leg -trouble walking, dizziness, loss of balance or coordination -seizures (convulsions) -swelling of the ankles, feet, hands -unusually weak or tired Side effects that usually do not require medical attention (report to your doctor or health care professional if they continue or are bothersome): -diarrhea -fever, chills (flu-like symptoms) -headaches -nausea, vomiting -redness, stinging, or swelling at site where injected This list may not describe all possible side effects. Call your doctor for medical advice about side effects. You may report side effects to FDA at 1-800-FDA-1088. Where should I keep my medicine? Keep out of the reach of children. Store in a refrigerator between 2 and 8 degrees C (36 and 46 degrees F). Do not freeze. Do not shake. Throw away any unused portion if using a single-dose vial. Throw away any unused medicine after the expiration date. NOTE: This sheet is a summary. It may not cover all possible information. If you have questions about this medicine, talk to your doctor, pharmacist, or health care provider.    2016, [employer]vier/[employer] Standard. (2008-04-08 10:23:57)

## 2016-03-23 LAB — ERYTHROPOIETIN: ERYTHROPOIETIN: 424.9 m[IU]/mL — AB (ref 2.6–18.5)

## 2016-03-23 LAB — VITAMIN B12: VITAMIN B 12: 622 pg/mL (ref 211–946)

## 2016-04-12 ENCOUNTER — Other Ambulatory Visit: Payer: Self-pay | Admitting: Hematology and Oncology

## 2016-04-12 ENCOUNTER — Other Ambulatory Visit (HOSPITAL_BASED_OUTPATIENT_CLINIC_OR_DEPARTMENT_OTHER): Payer: Medicare Other

## 2016-04-12 ENCOUNTER — Ambulatory Visit (HOSPITAL_BASED_OUTPATIENT_CLINIC_OR_DEPARTMENT_OTHER): Payer: Medicare Other

## 2016-04-12 VITALS — BP 132/90 | HR 72 | Temp 97.6°F | Resp 20

## 2016-04-12 DIAGNOSIS — D4620 Refractory anemia with excess of blasts, unspecified: Secondary | ICD-10-CM | POA: Diagnosis not present

## 2016-04-12 DIAGNOSIS — C321 Malignant neoplasm of supraglottis: Secondary | ICD-10-CM

## 2016-04-12 LAB — CBC WITH DIFFERENTIAL/PLATELET
BASO%: 1 % (ref 0.0–2.0)
Basophils Absolute: 0 10*3/uL (ref 0.0–0.1)
EOS ABS: 0.1 10*3/uL (ref 0.0–0.5)
EOS%: 3.2 % (ref 0.0–7.0)
HEMATOCRIT: 28.9 % — AB (ref 38.4–49.9)
HEMOGLOBIN: 9.3 g/dL — AB (ref 13.0–17.1)
LYMPH#: 0.8 10*3/uL — AB (ref 0.9–3.3)
LYMPH%: 24.3 % (ref 14.0–49.0)
MCH: 37.6 pg — ABNORMAL HIGH (ref 27.2–33.4)
MCHC: 32.1 g/dL (ref 32.0–36.0)
MCV: 116.9 fL — AB (ref 79.3–98.0)
MONO#: 0.4 10*3/uL (ref 0.1–0.9)
MONO%: 11.3 % (ref 0.0–14.0)
NEUT%: 60.2 % (ref 39.0–75.0)
NEUTROS ABS: 2.1 10*3/uL (ref 1.5–6.5)
Platelets: 279 10*3/uL (ref 140–400)
RBC: 2.47 10*6/uL — ABNORMAL LOW (ref 4.20–5.82)
RDW: 17.7 % — AB (ref 11.0–14.6)
WBC: 3.4 10*3/uL — AB (ref 4.0–10.3)

## 2016-04-12 MED ORDER — DARBEPOETIN ALFA 500 MCG/ML IJ SOSY
500.0000 ug | PREFILLED_SYRINGE | Freq: Once | INTRAMUSCULAR | Status: AC
  Administered 2016-04-12: 500 ug via SUBCUTANEOUS
  Filled 2016-04-12: qty 1

## 2016-04-12 NOTE — Patient Instructions (Signed)
Darbepoetin Alfa injection (Aranesp) What is this medicine? DARBEPOETIN ALFA (dar be POE e tin AL fa) helps your body make more red blood cells. It is used to treat anemia caused by chronic kidney failure and chemotherapy. This medicine may be used for other purposes; ask your health care provider or pharmacist if you have questions. What should I tell my health care provider before I take this medicine? They need to know if you have any of these conditions: -blood clotting disorders or history of blood clots -cancer patient not on chemotherapy -cystic fibrosis -heart disease, such as angina, heart failure, or a history of a heart attack -hemoglobin level of 12 g/dL or greater -high blood pressure -low levels of folate, iron, or vitamin B12 -seizures -an unusual or allergic reaction to darbepoetin, erythropoietin, albumin, hamster proteins, latex, other medicines, foods, dyes, or preservatives -pregnant or trying to get pregnant -breast-feeding How should I use this medicine? This medicine is for injection into a vein or under the skin. It is usually given by a health care professional in a hospital or clinic setting. If you get this medicine at home, you will be taught how to prepare and give this medicine. Do not shake the solution before you withdraw a dose. Use exactly as directed. Take your medicine at regular intervals. Do not take your medicine more often than directed. It is important that you put your used needles and syringes in a special sharps container. Do not put them in a trash can. If you do not have a sharps container, call your pharmacist or healthcare provider to get one. Talk to your pediatrician regarding the use of this medicine in children. While this medicine may be used in children as young as 1 year for selected conditions, precautions do apply. Overdosage: If you think you have taken too much of this medicine contact a poison control center or emergency room at  once. NOTE: This medicine is only for you. Do not share this medicine with others. What if I miss a dose? If you miss a dose, take it as soon as you can. If it is almost time for your next dose, take only that dose. Do not take double or extra doses. What may interact with this medicine? Do not take this medicine with any of the following medications: -epoetin alfa This list may not describe all possible interactions. Give your health care provider a list of all the medicines, herbs, non-prescription drugs, or dietary supplements you use. Also tell them if you smoke, drink alcohol, or use illegal drugs. Some items may interact with your medicine. What should I watch for while using this medicine? Visit your prescriber or health care professional for regular checks on your progress and for the needed blood tests and blood pressure measurements. It is especially important for the doctor to make sure your hemoglobin level is in the desired range, to limit the risk of potential side effects and to give you the best benefit. Keep all appointments for any recommended tests. Check your blood pressure as directed. Ask your doctor what your blood pressure should be and when you should contact him or her. As your body makes more red blood cells, you may need to take iron, folic acid, or vitamin B supplements. Ask your doctor or health care provider which products are right for you. If you have kidney disease continue dietary restrictions, even though this medication can make you feel better. Talk with your doctor or health care professional about  the foods you eat and the vitamins that you take. What side effects may I notice from receiving this medicine? Side effects that you should report to your doctor or health care professional as soon as possible: -allergic reactions like skin rash, itching or hives, swelling of the face, lips, or tongue -breathing problems -changes in vision -chest pain -confusion,  trouble speaking or understanding -feeling faint or lightheaded, falls -high blood pressure -muscle aches or pains -pain, swelling, warmth in the leg -rapid weight gain -severe headaches -sudden numbness or weakness of the face, arm or leg -trouble walking, dizziness, loss of balance or coordination -seizures (convulsions) -swelling of the ankles, feet, hands -unusually weak or tired Side effects that usually do not require medical attention (report to your doctor or health care professional if they continue or are bothersome): -diarrhea -fever, chills (flu-like symptoms) -headaches -nausea, vomiting -redness, stinging, or swelling at site where injected This list may not describe all possible side effects. Call your doctor for medical advice about side effects. You may report side effects to FDA at 1-800-FDA-1088. Where should I keep my medicine? Keep out of the reach of children. Store in a refrigerator between 2 and 8 degrees C (36 and 46 degrees F). Do not freeze. Do not shake. Throw away any unused portion if using a single-dose vial. Throw away any unused medicine after the expiration date. NOTE: This sheet is a summary. It may not cover all possible information. If you have questions about this medicine, talk to your doctor, pharmacist, or health care provider.    2016, [employer]vier/[employer] Standard. (2008-04-08 10:23:57)

## 2016-04-13 ENCOUNTER — Telehealth: Payer: Self-pay | Admitting: Hematology and Oncology

## 2016-04-13 NOTE — Telephone Encounter (Signed)
I spoke with his health insurance and received approval to give him darbepoetin injection. Authorization number is [id number], approved until 09/06/2016

## 2016-04-28 ENCOUNTER — Other Ambulatory Visit: Payer: Self-pay | Admitting: Hematology and Oncology

## 2016-04-28 DIAGNOSIS — D4620 Refractory anemia with excess of blasts, unspecified: Secondary | ICD-10-CM

## 2016-04-29 DIAGNOSIS — E538 Deficiency of other specified B group vitamins: Secondary | ICD-10-CM | POA: Diagnosis not present

## 2016-04-29 DIAGNOSIS — D471 Chronic myeloproliferative disease: Secondary | ICD-10-CM | POA: Diagnosis not present

## 2016-04-29 DIAGNOSIS — F411 Generalized anxiety disorder: Secondary | ICD-10-CM | POA: Diagnosis not present

## 2016-04-29 DIAGNOSIS — F39 Unspecified mood [affective] disorder: Secondary | ICD-10-CM | POA: Diagnosis not present

## 2016-04-29 DIAGNOSIS — Z0000 Encounter for general adult medical examination without abnormal findings: Secondary | ICD-10-CM | POA: Diagnosis not present

## 2016-04-29 DIAGNOSIS — Z720 Tobacco use: Secondary | ICD-10-CM | POA: Diagnosis not present

## 2016-04-29 DIAGNOSIS — G40909 Epilepsy, unspecified, not intractable, without status epilepticus: Secondary | ICD-10-CM | POA: Diagnosis not present

## 2016-04-29 DIAGNOSIS — C760 Malignant neoplasm of head, face and neck: Secondary | ICD-10-CM | POA: Diagnosis not present

## 2016-04-29 DIAGNOSIS — E46 Unspecified protein-calorie malnutrition: Secondary | ICD-10-CM | POA: Diagnosis not present

## 2016-04-29 DIAGNOSIS — J449 Chronic obstructive pulmonary disease, unspecified: Secondary | ICD-10-CM | POA: Diagnosis not present

## 2016-05-03 ENCOUNTER — Ambulatory Visit (HOSPITAL_BASED_OUTPATIENT_CLINIC_OR_DEPARTMENT_OTHER): Payer: Medicare Other

## 2016-05-03 ENCOUNTER — Other Ambulatory Visit (HOSPITAL_BASED_OUTPATIENT_CLINIC_OR_DEPARTMENT_OTHER): Payer: Medicare Other

## 2016-05-03 VITALS — BP 128/62 | HR 73 | Temp 98.3°F | Resp 20

## 2016-05-03 DIAGNOSIS — D4620 Refractory anemia with excess of blasts, unspecified: Secondary | ICD-10-CM | POA: Diagnosis not present

## 2016-05-03 DIAGNOSIS — D46Z Other myelodysplastic syndromes: Secondary | ICD-10-CM

## 2016-05-03 LAB — CBC WITH DIFFERENTIAL/PLATELET
BASO%: 1.1 % (ref 0.0–2.0)
BASOS ABS: 0 10*3/uL (ref 0.0–0.1)
EOS ABS: 0.1 10*3/uL (ref 0.0–0.5)
EOS%: 1.5 % (ref 0.0–7.0)
HCT: 27.1 % — ABNORMAL LOW (ref 38.4–49.9)
HEMOGLOBIN: 8.9 g/dL — AB (ref 13.0–17.1)
LYMPH%: 23.4 % (ref 14.0–49.0)
MCH: 38.9 pg — AB (ref 27.2–33.4)
MCHC: 33 g/dL (ref 32.0–36.0)
MCV: 117.8 fL — AB (ref 79.3–98.0)
MONO#: 0.5 10*3/uL (ref 0.1–0.9)
MONO%: 12.2 % (ref 0.0–14.0)
NEUT%: 61.8 % (ref 39.0–75.0)
NEUTROS ABS: 2.5 10*3/uL (ref 1.5–6.5)
PLATELETS: 250 10*3/uL (ref 140–400)
RBC: 2.3 10*6/uL — ABNORMAL LOW (ref 4.20–5.82)
RDW: 17.6 % — AB (ref 11.0–14.6)
WBC: 4 10*3/uL (ref 4.0–10.3)
lymph#: 0.9 10*3/uL (ref 0.9–3.3)

## 2016-05-03 MED ORDER — DARBEPOETIN ALFA 500 MCG/ML IJ SOSY
500.0000 ug | PREFILLED_SYRINGE | Freq: Once | INTRAMUSCULAR | Status: AC
  Administered 2016-05-03: 500 ug via SUBCUTANEOUS
  Filled 2016-05-03: qty 1

## 2016-05-03 NOTE — Patient Instructions (Signed)

## 2016-05-12 ENCOUNTER — Other Ambulatory Visit: Payer: Self-pay | Admitting: Family Medicine

## 2016-05-12 ENCOUNTER — Ambulatory Visit
Admission: RE | Admit: 2016-05-12 | Discharge: 2016-05-12 | Disposition: A | Discharge status: Home / Self Care | Payer: Medicare Other | Source: Ambulatory Visit | Attending: Family Medicine | Admitting: Family Medicine

## 2016-05-12 DIAGNOSIS — M79672 Pain in left foot: Secondary | ICD-10-CM | POA: Diagnosis not present

## 2016-05-12 DIAGNOSIS — R52 Pain, unspecified: Secondary | ICD-10-CM

## 2016-05-12 DIAGNOSIS — R3911 Hesitancy of micturition: Secondary | ICD-10-CM | POA: Diagnosis not present

## 2016-05-24 ENCOUNTER — Other Ambulatory Visit (HOSPITAL_BASED_OUTPATIENT_CLINIC_OR_DEPARTMENT_OTHER): Payer: Medicare Other

## 2016-05-24 ENCOUNTER — Ambulatory Visit (HOSPITAL_BASED_OUTPATIENT_CLINIC_OR_DEPARTMENT_OTHER): Payer: Medicare Other

## 2016-05-24 VITALS — BP 138/78 | HR 84 | Temp 97.5°F | Resp 18

## 2016-05-24 DIAGNOSIS — D4620 Refractory anemia with excess of blasts, unspecified: Secondary | ICD-10-CM

## 2016-05-24 LAB — CBC WITH DIFFERENTIAL/PLATELET
BASO%: 1 % (ref 0.0–2.0)
Basophils Absolute: 0 10*3/uL (ref 0.0–0.1)
EOS ABS: 0 10*3/uL (ref 0.0–0.5)
EOS%: 1 % (ref 0.0–7.0)
HEMATOCRIT: 27.5 % — AB (ref 38.4–49.9)
HGB: 9 g/dL — ABNORMAL LOW (ref 13.0–17.1)
LYMPH%: 20.8 % (ref 14.0–49.0)
MCH: 38.3 pg — ABNORMAL HIGH (ref 27.2–33.4)
MCHC: 32.7 g/dL (ref 32.0–36.0)
MCV: 117.1 fL — AB (ref 79.3–98.0)
MONO#: 0.4 10*3/uL (ref 0.1–0.9)
MONO%: 12.8 % (ref 0.0–14.0)
NEUT%: 64.4 % (ref 39.0–75.0)
NEUTROS ABS: 2.1 10*3/uL (ref 1.5–6.5)
PLATELETS: 251 10*3/uL (ref 140–400)
RBC: 2.35 10*6/uL — AB (ref 4.20–5.82)
RDW: 17.3 % — ABNORMAL HIGH (ref 11.0–14.6)
WBC: 3.2 10*3/uL — ABNORMAL LOW (ref 4.0–10.3)
lymph#: 0.7 10*3/uL — ABNORMAL LOW (ref 0.9–3.3)

## 2016-05-24 MED ORDER — DARBEPOETIN ALFA 500 MCG/ML IJ SOSY
500.0000 ug | PREFILLED_SYRINGE | Freq: Once | INTRAMUSCULAR | Status: AC
  Administered 2016-05-24: 500 ug via SUBCUTANEOUS
  Filled 2016-05-24: qty 1

## 2016-05-24 NOTE — Patient Instructions (Signed)
Darbepoetin Alfa injection (Aranesp) What is this medicine? DARBEPOETIN ALFA (dar be POE e tin AL fa) helps your body make more red blood cells. It is used to treat anemia caused by chronic kidney failure and chemotherapy. This medicine may be used for other purposes; ask your health care provider or pharmacist if you have questions. What should I tell my health care provider before I take this medicine? They need to know if you have any of these conditions: -blood clotting disorders or history of blood clots -cancer patient not on chemotherapy -cystic fibrosis -heart disease, such as angina, heart failure, or a history of a heart attack -hemoglobin level of 12 g/dL or greater -high blood pressure -low levels of folate, iron, or vitamin B12 -seizures -an unusual or allergic reaction to darbepoetin, erythropoietin, albumin, hamster proteins, latex, other medicines, foods, dyes, or preservatives -pregnant or trying to get pregnant -breast-feeding How should I use this medicine? This medicine is for injection into a vein or under the skin. It is usually given by a health care professional in a hospital or clinic setting. If you get this medicine at home, you will be taught how to prepare and give this medicine. Do not shake the solution before you withdraw a dose. Use exactly as directed. Take your medicine at regular intervals. Do not take your medicine more often than directed. It is important that you put your used needles and syringes in a special sharps container. Do not put them in a trash can. If you do not have a sharps container, call your pharmacist or healthcare provider to get one. Talk to your pediatrician regarding the use of this medicine in children. While this medicine may be used in children as young as 1 year for selected conditions, precautions do apply. Overdosage: If you think you have taken too much of this medicine contact a poison control center or emergency room at  once. NOTE: This medicine is only for you. Do not share this medicine with others. What if I miss a dose? If you miss a dose, take it as soon as you can. If it is almost time for your next dose, take only that dose. Do not take double or extra doses. What may interact with this medicine? Do not take this medicine with any of the following medications: -epoetin alfa This list may not describe all possible interactions. Give your health care provider a list of all the medicines, herbs, non-prescription drugs, or dietary supplements you use. Also tell them if you smoke, drink alcohol, or use illegal drugs. Some items may interact with your medicine. What should I watch for while using this medicine? Visit your prescriber or health care professional for regular checks on your progress and for the needed blood tests and blood pressure measurements. It is especially important for the doctor to make sure your hemoglobin level is in the desired range, to limit the risk of potential side effects and to give you the best benefit. Keep all appointments for any recommended tests. Check your blood pressure as directed. Ask your doctor what your blood pressure should be and when you should contact him or her. As your body makes more red blood cells, you may need to take iron, folic acid, or vitamin B supplements. Ask your doctor or health care provider which products are right for you. If you have kidney disease continue dietary restrictions, even though this medication can make you feel better. Talk with your doctor or health care professional about  the foods you eat and the vitamins that you take. What side effects may I notice from receiving this medicine? Side effects that you should report to your doctor or health care professional as soon as possible: -allergic reactions like skin rash, itching or hives, swelling of the face, lips, or tongue -breathing problems -changes in vision -chest pain -confusion,  trouble speaking or understanding -feeling faint or lightheaded, falls -high blood pressure -muscle aches or pains -pain, swelling, warmth in the leg -rapid weight gain -severe headaches -sudden numbness or weakness of the face, arm or leg -trouble walking, dizziness, loss of balance or coordination -seizures (convulsions) -swelling of the ankles, feet, hands -unusually weak or tired Side effects that usually do not require medical attention (report to your doctor or health care professional if they continue or are bothersome): -diarrhea -fever, chills (flu-like symptoms) -headaches -nausea, vomiting -redness, stinging, or swelling at site where injected This list may not describe all possible side effects. Call your doctor for medical advice about side effects. You may report side effects to FDA at 1-800-FDA-1088. Where should I keep my medicine? Keep out of the reach of children. Store in a refrigerator between 2 and 8 degrees C (36 and 46 degrees F). Do not freeze. Do not shake. Throw away any unused portion if using a single-dose vial. Throw away any unused medicine after the expiration date. NOTE: This sheet is a summary. It may not cover all possible information. If you have questions about this medicine, talk to your doctor, pharmacist, or health care provider.    2016, [employer]vier/[employer] Standard. (2008-04-08 10:23:57)

## 2016-05-26 ENCOUNTER — Telehealth: Payer: Self-pay | Admitting: *Deleted

## 2016-05-26 NOTE — Telephone Encounter (Signed)
Wife called states pt got a letter from [employer] stating they will not pay for his injections he receives at our office.   Asked wife if this is a bill? She says no bills have been received, just letters.  Informed her our office/billing dept will be working on getting injection covered. Please let us know if he actually gets a bill.  She verbalized understanding.

## 2016-06-07 ENCOUNTER — Other Ambulatory Visit: Payer: Self-pay | Admitting: Family Medicine

## 2016-06-07 ENCOUNTER — Ambulatory Visit
Admission: RE | Admit: 2016-06-07 | Discharge: 2016-06-07 | Disposition: A | Discharge status: Home / Self Care | Payer: Medicare Other | Source: Ambulatory Visit | Attending: Family Medicine | Admitting: Family Medicine

## 2016-06-07 DIAGNOSIS — R059 Cough, unspecified: Secondary | ICD-10-CM

## 2016-06-07 DIAGNOSIS — R9389 Abnormal findings on diagnostic imaging of other specified body structures: Secondary | ICD-10-CM

## 2016-06-07 DIAGNOSIS — E46 Unspecified protein-calorie malnutrition: Secondary | ICD-10-CM | POA: Diagnosis not present

## 2016-06-07 DIAGNOSIS — R05 Cough: Secondary | ICD-10-CM | POA: Diagnosis not present

## 2016-06-07 DIAGNOSIS — R0600 Dyspnea, unspecified: Secondary | ICD-10-CM | POA: Diagnosis not present

## 2016-06-07 DIAGNOSIS — R938 Abnormal findings on diagnostic imaging of other specified body structures: Secondary | ICD-10-CM | POA: Diagnosis not present

## 2016-06-09 ENCOUNTER — Ambulatory Visit
Admission: RE | Admit: 2016-06-09 | Discharge: 2016-06-09 | Disposition: A | Discharge status: Home / Self Care | Payer: Medicare Other | Source: Ambulatory Visit | Attending: Family Medicine | Admitting: Family Medicine

## 2016-06-09 DIAGNOSIS — R9389 Abnormal findings on diagnostic imaging of other specified body structures: Secondary | ICD-10-CM

## 2016-06-09 DIAGNOSIS — R911 Solitary pulmonary nodule: Secondary | ICD-10-CM | POA: Diagnosis not present

## 2016-06-09 MED ORDER — IOPAMIDOL (ISOVUE-300) INJECTION 61%
75.0000 mL | Freq: Once | INTRAVENOUS | Status: AC | PRN
  Administered 2016-06-09: 75 mL via INTRAVENOUS

## 2016-06-14 ENCOUNTER — Telehealth: Payer: Self-pay | Admitting: Hematology and Oncology

## 2016-06-14 ENCOUNTER — Ambulatory Visit (HOSPITAL_BASED_OUTPATIENT_CLINIC_OR_DEPARTMENT_OTHER): Payer: Medicare Other

## 2016-06-14 ENCOUNTER — Ambulatory Visit (HOSPITAL_BASED_OUTPATIENT_CLINIC_OR_DEPARTMENT_OTHER): Payer: Medicare Other | Admitting: Hematology and Oncology

## 2016-06-14 ENCOUNTER — Other Ambulatory Visit (HOSPITAL_BASED_OUTPATIENT_CLINIC_OR_DEPARTMENT_OTHER): Payer: Medicare Other

## 2016-06-14 VITALS — BP 111/67 | HR 97 | Temp 97.4°F | Resp 18 | Ht 73.0 in | Wt 111.2 lb

## 2016-06-14 DIAGNOSIS — Z8589 Personal history of malignant neoplasm of other organs and systems: Secondary | ICD-10-CM

## 2016-06-14 DIAGNOSIS — Z720 Tobacco use: Secondary | ICD-10-CM

## 2016-06-14 DIAGNOSIS — R918 Other nonspecific abnormal finding of lung field: Secondary | ICD-10-CM | POA: Insufficient documentation

## 2016-06-14 DIAGNOSIS — D4620 Refractory anemia with excess of blasts, unspecified: Secondary | ICD-10-CM

## 2016-06-14 DIAGNOSIS — B370 Candidal stomatitis: Secondary | ICD-10-CM

## 2016-06-14 LAB — CBC WITH DIFFERENTIAL/PLATELET
BASO%: 1 % (ref 0.0–2.0)
BASOS ABS: 0 10*3/uL (ref 0.0–0.1)
EOS%: 1.7 % (ref 0.0–7.0)
Eosinophils Absolute: 0.1 10*3/uL (ref 0.0–0.5)
HCT: 28.4 % — ABNORMAL LOW (ref 38.4–49.9)
HGB: 9.2 g/dL — ABNORMAL LOW (ref 13.0–17.1)
LYMPH%: 22.6 % (ref 14.0–49.0)
MCH: 38.3 pg — AB (ref 27.2–33.4)
MCHC: 32.4 g/dL (ref 32.0–36.0)
MCV: 118 fL — AB (ref 79.3–98.0)
MONO#: 0.5 10*3/uL (ref 0.1–0.9)
MONO%: 13.1 % (ref 0.0–14.0)
NEUT#: 2.3 10*3/uL (ref 1.5–6.5)
NEUT%: 61.6 % (ref 39.0–75.0)
Platelets: 537 10*3/uL — ABNORMAL HIGH (ref 140–400)
RBC: 2.4 10*6/uL — AB (ref 4.20–5.82)
RDW: 16.6 % — AB (ref 11.0–14.6)
WBC: 3.7 10*3/uL — ABNORMAL LOW (ref 4.0–10.3)
lymph#: 0.8 10*3/uL — ABNORMAL LOW (ref 0.9–3.3)

## 2016-06-14 MED ORDER — DARBEPOETIN ALFA 500 MCG/ML IJ SOSY
500.0000 ug | PREFILLED_SYRINGE | Freq: Once | INTRAMUSCULAR | Status: AC
  Administered 2016-06-14: 500 ug via SUBCUTANEOUS
  Filled 2016-06-14: qty 1

## 2016-06-14 MED ORDER — FLUCONAZOLE 100 MG PO TABS: 100.0000 mg | ORAL_TABLET | Freq: Every day | ORAL | 0 refills | Status: DC

## 2016-06-14 NOTE — Patient Instructions (Signed)
Darbepoetin Alfa injection (Aranesp) What is this medicine? DARBEPOETIN ALFA (dar be POE e tin AL fa) helps your body make more red blood cells. It is used to treat anemia caused by chronic kidney failure and chemotherapy. This medicine may be used for other purposes; ask your health care provider or pharmacist if you have questions. What should I tell my health care provider before I take this medicine? They need to know if you have any of these conditions: -blood clotting disorders or history of blood clots -cancer patient not on chemotherapy -cystic fibrosis -heart disease, such as angina, heart failure, or a history of a heart attack -hemoglobin level of 12 g/dL or greater -high blood pressure -low levels of folate, iron, or vitamin B12 -seizures -an unusual or allergic reaction to darbepoetin, erythropoietin, albumin, hamster proteins, latex, other medicines, foods, dyes, or preservatives -pregnant or trying to get pregnant -breast-feeding How should I use this medicine? This medicine is for injection into a vein or under the skin. It is usually given by a health care professional in a hospital or clinic setting. If you get this medicine at home, you will be taught how to prepare and give this medicine. Do not shake the solution before you withdraw a dose. Use exactly as directed. Take your medicine at regular intervals. Do not take your medicine more often than directed. It is important that you put your used needles and syringes in a special sharps container. Do not put them in a trash can. If you do not have a sharps container, call your pharmacist or healthcare provider to get one. Talk to your pediatrician regarding the use of this medicine in children. While this medicine may be used in children as young as 1 year for selected conditions, precautions do apply. Overdosage: If you think you have taken too much of this medicine contact a poison control center or emergency room at  once. NOTE: This medicine is only for you. Do not share this medicine with others. What if I miss a dose? If you miss a dose, take it as soon as you can. If it is almost time for your next dose, take only that dose. Do not take double or extra doses. What may interact with this medicine? Do not take this medicine with any of the following medications: -epoetin alfa This list may not describe all possible interactions. Give your health care provider a list of all the medicines, herbs, non-prescription drugs, or dietary supplements you use. Also tell them if you smoke, drink alcohol, or use illegal drugs. Some items may interact with your medicine. What should I watch for while using this medicine? Visit your prescriber or health care professional for regular checks on your progress and for the needed blood tests and blood pressure measurements. It is especially important for the doctor to make sure your hemoglobin level is in the desired range, to limit the risk of potential side effects and to give you the best benefit. Keep all appointments for any recommended tests. Check your blood pressure as directed. Ask your doctor what your blood pressure should be and when you should contact him or her. As your body makes more red blood cells, you may need to take iron, folic acid, or vitamin B supplements. Ask your doctor or health care provider which products are right for you. If you have kidney disease continue dietary restrictions, even though this medication can make you feel better. Talk with your doctor or health care professional about  the foods you eat and the vitamins that you take. What side effects may I notice from receiving this medicine? Side effects that you should report to your doctor or health care professional as soon as possible: -allergic reactions like skin rash, itching or hives, swelling of the face, lips, or tongue -breathing problems -changes in vision -chest pain -confusion,  trouble speaking or understanding -feeling faint or lightheaded, falls -high blood pressure -muscle aches or pains -pain, swelling, warmth in the leg -rapid weight gain -severe headaches -sudden numbness or weakness of the face, arm or leg -trouble walking, dizziness, loss of balance or coordination -seizures (convulsions) -swelling of the ankles, feet, hands -unusually weak or tired Side effects that usually do not require medical attention (report to your doctor or health care professional if they continue or are bothersome): -diarrhea -fever, chills (flu-like symptoms) -headaches -nausea, vomiting -redness, stinging, or swelling at site where injected This list may not describe all possible side effects. Call your doctor for medical advice about side effects. You may report side effects to FDA at 1-800-FDA-1088. Where should I keep my medicine? Keep out of the reach of children. Store in a refrigerator between 2 and 8 degrees C (36 and 46 degrees F). Do not freeze. Do not shake. Throw away any unused portion if using a single-dose vial. Throw away any unused medicine after the expiration date. NOTE: This sheet is a summary. It may not cover all possible information. If you have questions about this medicine, talk to your doctor, pharmacist, or health care provider.    2016, [employer]vier/[employer] Standard. (2008-04-08 10:23:57)

## 2016-06-14 NOTE — Telephone Encounter (Signed)
Appointments scheduled per 2/6 LOS. Patient given AVS report and calendars with future scheduled appointments. °

## 2016-06-16 ENCOUNTER — Encounter: Payer: Self-pay | Admitting: Hematology and Oncology

## 2016-06-16 NOTE — Assessment & Plan Note (Signed)
I spent some time counseling the patient the importance of tobacco cessation. he is currently attempting to quit on his own 

## 2016-06-16 NOTE — Progress Notes (Signed)
[facility] OFFICE PROGRESS NOTE  Patient Care Team: [name], MD as PCP - General (Family Medicine) [name], MD as Consulting Physician (Hematology and Oncology)  SUMMARY OF ONCOLOGIC HISTORY: Oncology History   MDS, R-IPSS score of 3, low risk (Hg 8.9, +16 chromosome on BM, 2% blast count)   Squamous cell carcinoma of the epiglottis   Primary site: Larynx - Supraglottis (Left)   Staging method: AJCC 7th Edition   Clinical: Stage I (T1, N0, M0) signed by [name], MD on 04/30/2013 12:49 PM   Pathologic: Stage I (T1, N0, cM0) signed by [name], MD on 04/30/2013 12:49 PM   Summary: Stage I (T1, N0, cM0)       MDS (myelodysplastic syndrome), low grade ([place])   02/01/2007 Bone Marrow Biopsy    BM biopsy was abnormal, overall probable low grade MDS      03/23/2011 - 02/06/2013 Chemotherapy    [name], discontinued due to diagnosis of laryngeal ca      09/06/2013 Bone Marrow Biopsy    Repeat bone marrow aspirate and biopsy confirmed low-grade myelodysplastic syndrome.      09/17/2013 -  Chemotherapy    The patient resumed darbepoetin injections to treat the anemia.       History of head and neck cancer   03/06/2013 Procedure    Biopsy from epiglottic region showed invasive [facility]      04/25/2013 Imaging    Ct scan showed no other involvement in the LN      05/13/2013 - 06/28/2013 Radiation Therapy    He received radiation therapy, 70 gray in 35 fractions to the larynx      06/09/2016 Imaging    CT scan of the chest showed Bilateral spiculated soft tissue pulmonary masses, highly suspicious for multifocal malignancy. Borderline enlarged left-sided mediastinal lymphadenopathy. Background of severe emphysematous changes, chronic cylindrical bronchiectasis and hyperinflation of the lungs. Findings consistent with longstanding COPD. Diffusely thickened interstitial markings. This may represent chronic interstitial lung changes, however  lymphangitic spread of malignancy cannot be excluded. Anterior compression deformities of several of the thoracic vertebral bodies, with heterogeneous appearance of T7 and T9 vertebral body. This may represent degenerative changes, however osseous metastatic disease cannot be excluded.        INTERVAL HISTORY: Please see below for problem oriented charting. He is referred to see me because of recent findings on CT scan of the chest, suspicious for cancer The patient has been having chronic cough for some time. He had prior diagnosis of head and neck cancer and continues to smoke. He denies recent hemoptysis. Denies problem with shortness of breath or recent weight loss The patient denies any recent signs or symptoms of bleeding such as spontaneous epistaxis, hematuria or hematochezia. He continues to receive darbepoetin injection every 3 weeks for diagnosis of myelodysplastic syndrome  REVIEW OF SYSTEMS:   Constitutional: Denies fevers, chills or abnormal weight loss Eyes: Denies blurriness of vision Ears, nose, mouth, throat, and face: Denies mucositis or sore throat Cardiovascular: Denies palpitation, chest discomfort or lower extremity swelling Gastrointestinal:  Denies nausea, heartburn or change in bowel habits Skin: Denies abnormal skin rashes Lymphatics: Denies new lymphadenopathy or easy bruising Neurological:Denies numbness, tingling or new weaknesses Behavioral/Psych: Mood is stable, no new changes  All other systems were reviewed with the patient and are negative.  I have reviewed the past medical history, past surgical history, social history and family history with the patient and they are unchanged from previous note.  ALLERGIES:  has No Known Allergies.  MEDICATIONS:  Current Outpatient Prescriptions  Medication Sig Dispense Refill  . cyanocobalamin 500 MCG tablet Take 500 mcg by mouth daily.    . diazepam (VALIUM) 5 MG tablet Take 10 mg by mouth every 6 (six)  hours as needed for anxiety.     . ferrous sulfate 325 (65 FE) MG tablet Take 325 mg by mouth daily with breakfast.    . folic acid (FOLVITE) 1 MG tablet Take 1 mg by mouth daily.    Marland Kitchen levETIRAcetam (KEPPRA) 500 MG tablet Take 1 tablet (500 mg total) by mouth 2 (two) times daily. 60 tablet 11  . mometasone (NASONEX) 50 MCG/ACT nasal spray Place 2 sprays into both nostrils daily.    . vitamin E 1000 UNIT capsule Take 1,000 Units by mouth daily.     . fluconazole (DIFLUCAN) 100 MG tablet Take 1 tablet (100 mg total) by mouth daily. 7 tablet 0   No current facility-administered medications for this visit.     PHYSICAL EXAMINATION: ECOG PERFORMANCE STATUS: 1 - Symptomatic but completely ambulatory  Vitals:   06/14/16 1157  BP: 111/67  Pulse: 97  Resp: 18  Temp: 97.4 F (36.3 C)   Filed Weights   06/14/16 1157  Weight: 111 lb 3.2 oz (50.4 kg)    GENERAL:alert, no distress and comfortable. He looks thin and cachectic SKIN: skin color, texture, turgor are normal, no rashes or significant lesions EYES: normal, Conjunctiva are pink and non-injected, sclera clear OROPHARYNX:no exudate, no erythema and lips, buccal mucosa, and tongue normal  NECK: supple, thyroid normal size, non-tender, without nodularity LYMPH:  no palpable lymphadenopathy in the cervical, axillary or inguinal LUNGS: clear to auscultation and percussion with normal breathing effort HEART: regular rate & rhythm and no murmurs and no lower extremity edema ABDOMEN:abdomen soft, non-tender and normal bowel sounds Musculoskeletal:no cyanosis of digits with signs of digital clubbing NEURO: alert & oriented x 3 with fluent speech, no focal motor/sensory deficits  LABORATORY DATA:  I have reviewed the data as listed    Component Value Date/Time   NA 139 06/21/2014 0521   NA 139 09/17/2013 1158   K 3.7 06/21/2014 0521   K 4.1 09/17/2013 1158   CL 106 06/21/2014 0521   CL 107 09/19/2012 1325   CO2 25 06/21/2014 0521    CO2 20 (L) 09/17/2013 1158   GLUCOSE 105 (H) 06/21/2014 0521   GLUCOSE 77 09/17/2013 1158   GLUCOSE 97 09/19/2012 1325   BUN 19 06/21/2014 0521   BUN 17.4 09/17/2013 1158   CREATININE 0.83 06/21/2014 0521   CREATININE 0.9 09/17/2013 1158   CALCIUM 8.6 06/21/2014 0521   CALCIUM 9.3 09/17/2013 1158   PROT 8.5 (H) 06/21/2014 0521   PROT 8.5 (H) 09/17/2013 1158   ALBUMIN 3.4 (L) 06/21/2014 0521   ALBUMIN 3.5 09/17/2013 1158   AST 15 06/21/2014 0521   AST 10 09/17/2013 1158   ALT 8 06/21/2014 0521   ALT <6 09/17/2013 1158   ALKPHOS 129 (H) 06/21/2014 0521   ALKPHOS 151 (H) 09/17/2013 1158   BILITOT 0.3 06/21/2014 0521   BILITOT 0.23 09/17/2013 1158   GFRNONAA >90 06/21/2014 0521   GFRAA >90 06/21/2014 0521    No results found for: SPEP, UPEP  Lab Results  Component Value Date   WBC 3.7 (L) 06/14/2016   NEUTROABS 2.3 06/14/2016   HGB 9.2 (L) 06/14/2016   HCT 28.4 (L) 06/14/2016   MCV 118.0 (H) 06/14/2016  PLT 537 (H) 06/14/2016      Chemistry      Component Value Date/Time   NA 139 06/21/2014 0521   NA 139 09/17/2013 1158   K 3.7 06/21/2014 0521   K 4.1 09/17/2013 1158   CL 106 06/21/2014 0521   CL 107 09/19/2012 1325   CO2 25 06/21/2014 0521   CO2 20 (L) 09/17/2013 1158   BUN 19 06/21/2014 0521   BUN 17.4 09/17/2013 1158   CREATININE 0.83 06/21/2014 0521   CREATININE 0.9 09/17/2013 1158      Component Value Date/Time   CALCIUM 8.6 06/21/2014 0521   CALCIUM 9.3 09/17/2013 1158   ALKPHOS 129 (H) 06/21/2014 0521   ALKPHOS 151 (H) 09/17/2013 1158   AST 15 06/21/2014 0521   AST 10 09/17/2013 1158   ALT 8 06/21/2014 0521   ALT <6 09/17/2013 1158   BILITOT 0.3 06/21/2014 0521   BILITOT 0.23 09/17/2013 1158       RADIOGRAPHIC STUDIES: I have personally reviewed the radiological images as listed and agreed with the findings in the report. Dg Chest 2 View  Result Date: 06/07/2016 CLINICAL DATA:  Cough, history of smoking and history of laryngeal carcinoma  EXAM: CHEST  2 VIEW COMPARISON:  07/03/2013, 07/02/2014 FINDINGS: Cardiac shadow is within normal limits. Rounded masslike densities are noted throughout both lungs the largest of these lies over the left lung base measuring approximately 2.8 cm. These changes are consistent with metastatic disease till proven otherwise. No focal infiltrate or sizable effusion is seen. No acute bony abnormality is noted. Chronic compression deformities are noted in the mid and upper thoracic spine stable from previous exam. IMPRESSION: New nodular densities throughout both lungs consistent with metastatic disease. CT of the chest is recommended for further evaluation. These results will be called to the ordering clinician or representative by the Radiologist Assistant, and communication documented in the PACS or zVision Dashboard. Electronically Signed   By: Inez Catalina M.D.   On: 06/07/2016 15:30   Ct Chest W Contrast  Result Date: 06/09/2016 CLINICAL DATA:  Follow-up of pulmonary nodule. History of throat cancer. EXAM: CT CHEST WITH CONTRAST TECHNIQUE: Multidetector CT imaging of the chest was performed during intravenous contrast administration. CONTRAST:  80m ISOVUE-300 IOPAMIDOL (ISOVUE-300) INJECTION 61% COMPARISON:  Chest radiograph 06/07/2016 FINDINGS: Cardiovascular: Normal heart size. No pericardial effusion. Calcific atherosclerotic disease of the coronary arteries. Mild fusiform dilation of the ascending aorta with maximum diameter of 3.9 cm Mediastinum/Nodes: Mildly enlarged precarinal lymph node measures 10 mm in short axis. Smaller left pretracheal and prevascular lymph nodes noted. Diffuse thickening of the esophagus. Thyroid gland is normal. Lungs/Pleura: Background severe emphysematous changes, hyperinflation and chronic cylindrical bronchiectasis. Diffusely increased interstitial markings. Several spiculated pulmonary masses in bilateral lungs. Left upper lobe peripheral subpleural spiculated mass measures  2.2 x 2.5 by 1.8 cm. Left lingular spiculated mass with spicules extending to the pleura measures 3.2 by 2.0 by 2.4 cm. Peripheral left lower lobe also subpleural spiculated mass measures 1.1 x 1.0 by 1.2 cm. Similarly spiculated mass in the superior segment of right lower lobe measures 1.7 by 2.0 by 1.9 cm. Fissural retraction is noted associated with this mass. Second peripheral spiculated mass with focal areas of necrosis is seen in the basilar portion of the right lower lobe and measures 1.5 by 1.3 by 1.8 cm. Nodular subpleural thickening in the right apex may represent scarring versus another subpleural mass, the size of which is difficult to measure due to the associated  extensive chronic subpleural scarring. Bilateral lower lobe atelectatic changes. Upper Abdomen: No acute abnormality.  Bilateral renal cysts noted. Musculoskeletal: Exaggerated thoracic kyphosis. Anterior compression deformities of several of the vertebral bodies, most prominent and T6, T7 and T9. Heterogeneous appearance of T7 and T9. IMPRESSION: Bilateral spiculated soft tissue pulmonary masses, highly suspicious for multifocal malignancy. Borderline enlarged left-sided mediastinal lymphadenopathy. Background of severe emphysematous changes, chronic cylindrical bronchiectasis and hyperinflation of the lungs. Findings consistent with longstanding COPD. Diffusely thickened interstitial markings. This may represent chronic interstitial lung changes, however lymphangitic spread of malignancy cannot be excluded. Anterior compression deformities of several of the thoracic vertebral bodies, with heterogeneous appearance of T7 and T9 vertebral body. This may represent degenerative changes, however osseous metastatic disease cannot be excluded. Electronically Signed   By: Fidela Salisbury M.D.   On: 2020/02/2217 15:41    ASSESSMENT & PLAN:  History of head and neck cancer The patient had history of laryngeal cancer status post radiation  treatment. Despite diagnosis of cancer, he continues to smoke. CT imaging study now show evidence suspicious for cancer. He could be related to recurrent, metastatic laryngeal cancer versus new bronchogenic carcinoma given his long-standing smoking history. I recommend PET scan for staging along with MRI of the brain. Given his prior history of cancer is over 2 years, I recommend biopsy but I would wait until after PET/CT scan to determine the location for biopsy. He agreed with the plan of care  Tobacco abuse I spent some time counseling the patient the importance of tobacco cessation. he is currently attempting to quit on his own   MDS (myelodysplastic syndrome), low grade The anemia from MDS is stable. I suspect he may have some degree of bone marrow suppression from his antiseizure medications. He had responded well to Darbopeitin in the past with improvement in energy level. The patient will return every 3 weeks to get blood work checked and he will get injection to keep hemoglobin greater than 10 g.   Orders Placed This Encounter  Procedures  . NM PET Image Initial (PI) Skull Base To Thigh    Standing Status:   Future    Standing Expiration Date:   07/19/2017    Order Specific Question:   Reason for exam:    Answer:   staging lung cancer, possible recurrent laryngeal ca    Order Specific Question:   Preferred imaging location?    Answer:   [place] CONTRAST    Standing Status:   Future    Standing Expiration Date:   07/19/2017    Order Specific Question:   Reason for exam:    Answer:   staging lung cancer, possible recurrent laryngeal ca    Order Specific Question:   Preferred imaging location?    Answer:   [facility] (table limit-350 lbs)    Order Specific Question:   Does the patient have a pacemaker or implanted devices?    Answer:   No   All questions were answered. The patient knows to call the clinic with any problems, questions or  concerns. No barriers to learning was detected. I spent 30 minutes counseling the patient face to face. The total time spent in the appointment was 55 minutes and more than 50% was on counseling and review of test results     [name], MD 06/16/2016 7:17 AM

## 2016-06-16 NOTE — Assessment & Plan Note (Signed)
The patient had history of laryngeal cancer status post radiation treatment. Despite diagnosis of cancer, he continues to smoke. CT imaging study now show evidence suspicious for cancer. He could be related to recurrent, metastatic laryngeal cancer versus new bronchogenic carcinoma given his long-standing smoking history. I recommend PET scan for staging along with MRI of the brain. Given his prior history of cancer is over 2 years, I recommend biopsy but I would wait until after PET/CT scan to determine the location for biopsy. He agreed with the plan of care

## 2016-06-16 NOTE — Assessment & Plan Note (Signed)
The anemia from MDS is stable. I suspect he may have some degree of bone marrow suppression from his antiseizure medications. He had responded well to Darbopeitin in the past with improvement in energy level. The patient will return every 3 weeks to get blood work checked and he will get injection to keep hemoglobin greater than 10 g.

## 2016-06-29 ENCOUNTER — Ambulatory Visit (HOSPITAL_COMMUNITY)
Admission: RE | Admit: 2016-06-29 | Discharge: 2016-06-29 | Disposition: A | Discharge status: Home / Self Care | Payer: Medicare Other | Source: Ambulatory Visit | Attending: Hematology and Oncology | Admitting: Hematology and Oncology

## 2016-06-29 DIAGNOSIS — Z8589 Personal history of malignant neoplasm of other organs and systems: Secondary | ICD-10-CM | POA: Diagnosis not present

## 2016-06-29 DIAGNOSIS — R918 Other nonspecific abnormal finding of lung field: Secondary | ICD-10-CM | POA: Diagnosis not present

## 2016-06-29 DIAGNOSIS — G319 Degenerative disease of nervous system, unspecified: Secondary | ICD-10-CM | POA: Insufficient documentation

## 2016-06-29 DIAGNOSIS — C3490 Malignant neoplasm of unspecified part of unspecified bronchus or lung: Secondary | ICD-10-CM | POA: Diagnosis not present

## 2016-06-29 LAB — POCT I-STAT CREATININE: CREATININE: 0.9 mg/dL (ref 0.61–1.24)

## 2016-06-29 MED ORDER — GADOBENATE DIMEGLUMINE 529 MG/ML IV SOLN
10.0000 mL | Freq: Once | INTRAVENOUS | Status: AC | PRN
  Administered 2016-06-29: 10 mL via INTRAVENOUS

## 2016-06-30 ENCOUNTER — Telehealth: Payer: Self-pay | Admitting: *Deleted

## 2016-06-30 NOTE — Telephone Encounter (Signed)
Spoke with central scheduling, they have been unable to reach patient. This RN called patient, gave the # to central scheduling and told to ask for courtney. Patient verbalized understanding.

## 2016-07-01 ENCOUNTER — Telehealth: Payer: Self-pay | Admitting: *Deleted

## 2016-07-01 NOTE — Telephone Encounter (Signed)
-----   Message from [name], MD sent at 07/01/2016  7:52 AM EST ----- Regarding: appt Hi Adrian [name],  Please call patient. I have cancelled his appt to see me on 2/27; he should keep it for labs and Aranesp injection.  I have placed a scheduling msg to see him at 3pm on 3/5. He has PET scan at 9 am. Results should be out that evening. PLease tell him to check in at 230 pm that day  Thanks

## 2016-07-01 NOTE — Telephone Encounter (Signed)
Left  message on wife's phone with note below. To call to confirm

## 2016-07-05 ENCOUNTER — Ambulatory Visit: Payer: Self-pay

## 2016-07-05 ENCOUNTER — Ambulatory Visit: Payer: Self-pay | Admitting: Hematology and Oncology

## 2016-07-05 ENCOUNTER — Other Ambulatory Visit (HOSPITAL_BASED_OUTPATIENT_CLINIC_OR_DEPARTMENT_OTHER): Payer: Medicare Other

## 2016-07-05 ENCOUNTER — Ambulatory Visit (HOSPITAL_BASED_OUTPATIENT_CLINIC_OR_DEPARTMENT_OTHER): Payer: Medicare Other

## 2016-07-05 VITALS — BP 127/66 | HR 88 | Temp 97.6°F | Resp 20

## 2016-07-05 DIAGNOSIS — D4620 Refractory anemia with excess of blasts, unspecified: Secondary | ICD-10-CM | POA: Diagnosis not present

## 2016-07-05 LAB — CBC WITH DIFFERENTIAL/PLATELET
BASO%: 1.5 % (ref 0.0–2.0)
Basophils Absolute: 0 10*3/uL (ref 0.0–0.1)
EOS ABS: 0 10*3/uL (ref 0.0–0.5)
EOS%: 1.4 % (ref 0.0–7.0)
HCT: 26.3 % — ABNORMAL LOW (ref 38.4–49.9)
HGB: 8.6 g/dL — ABNORMAL LOW (ref 13.0–17.1)
LYMPH%: 30.4 % (ref 14.0–49.0)
MCH: 38.6 pg — ABNORMAL HIGH (ref 27.2–33.4)
MCHC: 32.9 g/dL (ref 32.0–36.0)
MCV: 117.5 fL — AB (ref 79.3–98.0)
MONO#: 0.3 10*3/uL (ref 0.1–0.9)
MONO%: 11.6 % (ref 0.0–14.0)
NEUT%: 55.1 % (ref 39.0–75.0)
NEUTROS ABS: 1.4 10*3/uL — AB (ref 1.5–6.5)
PLATELETS: 304 10*3/uL (ref 140–400)
RBC: 2.24 10*6/uL — ABNORMAL LOW (ref 4.20–5.82)
RDW: 17.8 % — AB (ref 11.0–14.6)
WBC: 2.5 10*3/uL — AB (ref 4.0–10.3)
lymph#: 0.8 10*3/uL — ABNORMAL LOW (ref 0.9–3.3)

## 2016-07-05 MED ORDER — DARBEPOETIN ALFA 500 MCG/ML IJ SOSY
500.0000 ug | PREFILLED_SYRINGE | Freq: Once | INTRAMUSCULAR | Status: AC
  Administered 2016-07-05: 500 ug via SUBCUTANEOUS
  Filled 2016-07-05: qty 1

## 2016-07-05 NOTE — Patient Instructions (Signed)
Darbepoetin Alfa injection (Aranesp) What is this medicine? DARBEPOETIN ALFA (dar be POE e tin AL fa) helps your body make more red blood cells. It is used to treat anemia caused by chronic kidney failure and chemotherapy. This medicine may be used for other purposes; ask your health care provider or pharmacist if you have questions. What should I tell my health care provider before I take this medicine? They need to know if you have any of these conditions: -blood clotting disorders or history of blood clots -cancer patient not on chemotherapy -cystic fibrosis -heart disease, such as angina, heart failure, or a history of a heart attack -hemoglobin level of 12 g/dL or greater -high blood pressure -low levels of folate, iron, or vitamin B12 -seizures -an unusual or allergic reaction to darbepoetin, erythropoietin, albumin, hamster proteins, latex, other medicines, foods, dyes, or preservatives -pregnant or trying to get pregnant -breast-feeding How should I use this medicine? This medicine is for injection into a vein or under the skin. It is usually given by a health care professional in a hospital or clinic setting. If you get this medicine at home, you will be taught how to prepare and give this medicine. Do not shake the solution before you withdraw a dose. Use exactly as directed. Take your medicine at regular intervals. Do not take your medicine more often than directed. It is important that you put your used needles and syringes in a special sharps container. Do not put them in a trash can. If you do not have a sharps container, call your pharmacist or healthcare provider to get one. Talk to your pediatrician regarding the use of this medicine in children. While this medicine may be used in children as young as 1 year for selected conditions, precautions do apply. Overdosage: If you think you have taken too much of this medicine contact a poison control center or emergency room at  once. NOTE: This medicine is only for you. Do not share this medicine with others. What if I miss a dose? If you miss a dose, take it as soon as you can. If it is almost time for your next dose, take only that dose. Do not take double or extra doses. What may interact with this medicine? Do not take this medicine with any of the following medications: -epoetin alfa This list may not describe all possible interactions. Give your health care provider a list of all the medicines, herbs, non-prescription drugs, or dietary supplements you use. Also tell them if you smoke, drink alcohol, or use illegal drugs. Some items may interact with your medicine. What should I watch for while using this medicine? Visit your prescriber or health care professional for regular checks on your progress and for the needed blood tests and blood pressure measurements. It is especially important for the doctor to make sure your hemoglobin level is in the desired range, to limit the risk of potential side effects and to give you the best benefit. Keep all appointments for any recommended tests. Check your blood pressure as directed. Ask your doctor what your blood pressure should be and when you should contact him or her. As your body makes more red blood cells, you may need to take iron, folic acid, or vitamin B supplements. Ask your doctor or health care provider which products are right for you. If you have kidney disease continue dietary restrictions, even though this medication can make you feel better. Talk with your doctor or health care professional about  the foods you eat and the vitamins that you take. What side effects may I notice from receiving this medicine? Side effects that you should report to your doctor or health care professional as soon as possible: -allergic reactions like skin rash, itching or hives, swelling of the face, lips, or tongue -breathing problems -changes in vision -chest pain -confusion,  trouble speaking or understanding -feeling faint or lightheaded, falls -high blood pressure -muscle aches or pains -pain, swelling, warmth in the leg -rapid weight gain -severe headaches -sudden numbness or weakness of the face, arm or leg -trouble walking, dizziness, loss of balance or coordination -seizures (convulsions) -swelling of the ankles, feet, hands -unusually weak or tired Side effects that usually do not require medical attention (report to your doctor or health care professional if they continue or are bothersome): -diarrhea -fever, chills (flu-like symptoms) -headaches -nausea, vomiting -redness, stinging, or swelling at site where injected This list may not describe all possible side effects. Call your doctor for medical advice about side effects. You may report side effects to FDA at 1-800-FDA-1088. Where should I keep my medicine? Keep out of the reach of children. Store in a refrigerator between 2 and 8 degrees C (36 and 46 degrees F). Do not freeze. Do not shake. Throw away any unused portion if using a single-dose vial. Throw away any unused medicine after the expiration date. NOTE: This sheet is a summary. It may not cover all possible information. If you have questions about this medicine, talk to your doctor, pharmacist, or health care provider.    2016, [employer]vier/[employer] Standard. (2008-04-08 10:23:57)

## 2016-07-11 ENCOUNTER — Telehealth: Payer: Self-pay | Admitting: Interventional Radiology

## 2016-07-11 ENCOUNTER — Ambulatory Visit (HOSPITAL_COMMUNITY)
Admission: RE | Admit: 2016-07-11 | Discharge: 2016-07-11 | Disposition: A | Discharge status: Home / Self Care | Payer: Medicare Other | Source: Ambulatory Visit | Attending: Hematology and Oncology | Admitting: Hematology and Oncology

## 2016-07-11 ENCOUNTER — Ambulatory Visit (HOSPITAL_BASED_OUTPATIENT_CLINIC_OR_DEPARTMENT_OTHER): Payer: Medicare Other | Admitting: Hematology and Oncology

## 2016-07-11 DIAGNOSIS — I700 Atherosclerosis of aorta: Secondary | ICD-10-CM | POA: Diagnosis not present

## 2016-07-11 DIAGNOSIS — C7802 Secondary malignant neoplasm of left lung: Secondary | ICD-10-CM | POA: Diagnosis not present

## 2016-07-11 DIAGNOSIS — I2510 Atherosclerotic heart disease of native coronary artery without angina pectoris: Secondary | ICD-10-CM | POA: Insufficient documentation

## 2016-07-11 DIAGNOSIS — R918 Other nonspecific abnormal finding of lung field: Secondary | ICD-10-CM | POA: Diagnosis not present

## 2016-07-11 DIAGNOSIS — Z859 Personal history of malignant neoplasm, unspecified: Secondary | ICD-10-CM | POA: Diagnosis present

## 2016-07-11 DIAGNOSIS — D4620 Refractory anemia with excess of blasts, unspecified: Secondary | ICD-10-CM

## 2016-07-11 DIAGNOSIS — R64 Cachexia: Secondary | ICD-10-CM

## 2016-07-11 DIAGNOSIS — Z720 Tobacco use: Secondary | ICD-10-CM | POA: Diagnosis not present

## 2016-07-11 DIAGNOSIS — J439 Emphysema, unspecified: Secondary | ICD-10-CM | POA: Insufficient documentation

## 2016-07-11 DIAGNOSIS — C801 Malignant (primary) neoplasm, unspecified: Secondary | ICD-10-CM | POA: Diagnosis not present

## 2016-07-11 DIAGNOSIS — Z8589 Personal history of malignant neoplasm of other organs and systems: Secondary | ICD-10-CM

## 2016-07-11 LAB — GLUCOSE, CAPILLARY: Glucose-Capillary: 50 mg/dL — ABNORMAL LOW (ref 65–99)

## 2016-07-11 MED ORDER — FLUDEOXYGLUCOSE F - 18 (FDG) INJECTION
5.4000 | Freq: Once | INTRAVENOUS | Status: AC | PRN
  Administered 2016-07-11: 5.4 via INTRAVENOUS

## 2016-07-11 MED ORDER — PREDNISONE 5 MG PO TABS: 5.0000 mg | ORAL_TABLET | Freq: Every day | ORAL | 1 refills | Status: DC

## 2016-07-11 NOTE — Telephone Encounter (Signed)
Asked to review for perc biopsy. Bilat pulmonary lesions on PET, no e/o distal mets. Biopsy carries Higher risk due to underlying severe COPD. Reviewed over phone with Dr. Alvy Bimler.  I think CT core biopsy of pleural-based LUL lesion would likely give adequate yield and lowest relative risk. She plans to put in the order.

## 2016-07-12 ENCOUNTER — Encounter: Payer: Self-pay | Admitting: Hematology and Oncology

## 2016-07-12 NOTE — Assessment & Plan Note (Signed)
I spent some time counseling the patient the importance of tobacco cessation. he is currently attempting to quit on his own 

## 2016-07-12 NOTE — Assessment & Plan Note (Signed)
I reviewed multiple imaging study with the patient and his wife. MRI of the head is negative. PET CT scan show bilateral pulmonary masses, differential diagnosis included possible bronchogenic carcinoma versus metastatic spread It is possible that the metastatic disease seen in the lungs could be related to his prior head and neck cancer 3 years ago. However, given his smoking history, primary bronchogenic carcinoma cannot be excluded. In any case, I discussed the rationale of pursuing lung biopsy with the patient. I have discussed the imaging study with the radiologist. Per recommendation from radiologist, we will proceed with left upper lobe CT-guided biopsy for diagnostic purposes. I will see him back after results are available

## 2016-07-12 NOTE — Assessment & Plan Note (Signed)
The patient has poor appetite.  He is not able to gain weight. We discussed appetite stimulant with low-dose prednisone.  He agreed to try.

## 2016-07-12 NOTE — Assessment & Plan Note (Signed)
The anemia from MDS is stable. I suspect he may have some degree of bone marrow suppression from his antiseizure medications. He had responded well to Darbopeitin in the past with improvement in energy level. The patient will return every 3 weeks to get blood work checked and he will get injection to keep hemoglobin greater than 10 g.

## 2016-07-12 NOTE — Assessment & Plan Note (Signed)
The patient is currently not symptomatic from pulmonary metastases. As above, we will pursue CT-guided biopsy

## 2016-07-12 NOTE — Progress Notes (Signed)
[facility] OFFICE PROGRESS NOTE  Patient Care Team: [name], MD as PCP - General (Family Medicine) [name], MD as Consulting Physician (Hematology and Oncology)  SUMMARY OF ONCOLOGIC HISTORY: Oncology History   MDS, R-IPSS score of 3, low risk (Hg 8.9, +16 chromosome on BM, 2% blast count)   Squamous cell carcinoma of the epiglottis   Primary site: Larynx - Supraglottis (Left)   Staging method: AJCC 7th Edition   Clinical: Stage I (T1, N0, M0) signed by [name], MD on 04/30/2013 12:49 PM   Pathologic: Stage I (T1, N0, cM0) signed by [name], MD on 04/30/2013 12:49 PM   Summary: Stage I (T1, N0, cM0)       MDS (myelodysplastic syndrome), low grade ([place])   02/01/2007 Bone Marrow Biopsy    BM biopsy was abnormal, overall probable low grade MDS      03/23/2011 - 02/06/2013 Chemotherapy    [name], discontinued due to diagnosis of laryngeal ca      09/06/2013 Bone Marrow Biopsy    Repeat bone marrow aspirate and biopsy confirmed low-grade myelodysplastic syndrome.      09/17/2013 -  Chemotherapy    The patient resumed darbepoetin injections to treat the anemia.       History of head and neck cancer   03/06/2013 Procedure    Biopsy from epiglottic region showed invasive [facility]      04/25/2013 Imaging    Ct scan showed no other involvement in the LN      05/13/2013 - 06/28/2013 Radiation Therapy    He received radiation therapy, 70 gray in 35 fractions to the larynx      06/09/2016 Imaging    CT scan of the chest showed Bilateral spiculated soft tissue pulmonary masses, highly suspicious for multifocal malignancy. Borderline enlarged left-sided mediastinal lymphadenopathy. Background of severe emphysematous changes, chronic cylindrical bronchiectasis and hyperinflation of the lungs. Findings consistent with longstanding COPD. Diffusely thickened interstitial markings. This may represent chronic interstitial lung changes, however  lymphangitic spread of malignancy cannot be excluded. Anterior compression deformities of several of the thoracic vertebral bodies, with heterogeneous appearance of T7 and T9 vertebral body. This may represent degenerative changes, however osseous metastatic disease cannot be excluded.       07/11/2016 PET scan    There are 2 nodules in the left upper lobe and 2 nodules within the right lower lobe which have spiculated margin and exhibit intense radiotracer uptake compatible with multifocal pulmonary metastasis versus multiple synchronous primary pulmonary neoplasms. 2. Emphysema 3. Aortic atherosclerosis and multi vessel coronary artery calcification.       INTERVAL HISTORY: Please see below for problem oriented charting. He returns with his wife to review imaging study results. Overall, he is not symptomatic.  Denies cough, chest pain or hemoptysis. He is attempting to quit smoking.  He is down to 1 cigarettes per day. His wife has noticed he has poor appetite. He has mild progressive weight loss steadily over the last few months.  His weight is actually up 1-1/2 pounds since I saw him. He denies swallowing difficulties  REVIEW OF SYSTEMS:   Constitutional: Denies fevers, chills  Eyes: Denies blurriness of vision Ears, nose, mouth, throat, and face: Denies mucositis or sore throat Respiratory: Denies cough, dyspnea or wheezes Cardiovascular: Denies palpitation, chest discomfort or lower extremity swelling Gastrointestinal:  Denies nausea, heartburn or change in bowel habits Skin: Denies abnormal skin rashes Lymphatics: Denies new lymphadenopathy or easy bruising Neurological:Denies numbness, tingling  or new weaknesses Behavioral/Psych: Mood is stable, no new changes  All other systems were reviewed with the patient and are negative.  I have reviewed the past medical history, past surgical history, social history and family history with the patient and they are unchanged from previous  note.  ALLERGIES:  has No Known Allergies.  MEDICATIONS:  Current Outpatient Prescriptions  Medication Sig Dispense Refill  . cyanocobalamin 500 MCG tablet Take 500 mcg by mouth daily.    . diazepam (VALIUM) 5 MG tablet Take 10 mg by mouth every 6 (six) hours as needed for anxiety.     . ferrous sulfate 325 (65 FE) MG tablet Take 325 mg by mouth daily with breakfast.    . fluconazole (DIFLUCAN) 100 MG tablet Take 1 tablet (100 mg total) by mouth daily. 7 tablet 0  . folic acid (FOLVITE) 1 MG tablet Take 1 mg by mouth daily.    Marland Kitchen levETIRAcetam (KEPPRA) 500 MG tablet Take 1 tablet (500 mg total) by mouth 2 (two) times daily. 60 tablet 11  . mometasone (NASONEX) 50 MCG/ACT nasal spray Place 2 sprays into both nostrils daily.    . vitamin E 1000 UNIT capsule Take 1,000 Units by mouth daily.     . predniSONE (DELTASONE) 5 MG tablet Take 1 tablet (5 mg total) by mouth daily with breakfast. 30 tablet 1   No current facility-administered medications for this visit.     PHYSICAL EXAMINATION: ECOG PERFORMANCE STATUS: 1 - Symptomatic but completely ambulatory  Vitals:   07/11/16 1446  BP: 112/66  Pulse: 87  Resp: 17  Temp: 97.6 F (36.4 C)   Filed Weights   07/11/16 1446  Weight: 112 lb 8 oz (51 kg)    GENERAL:alert, no distress and comfortable.  He looks thin and cachectic SKIN: skin color, texture, turgor are normal, no rashes or significant lesions EYES: normal, Conjunctiva are pink and non-injected, sclera clear Musculoskeletal:no cyanosis of digits and no clubbing  NEURO: alert & oriented x 3 with fluent speech, no focal motor/sensory deficits  LABORATORY DATA:  I have reviewed the data as listed    Component Value Date/Time   NA 139 06/21/2014 0521   NA 139 09/17/2013 1158   K 3.7 06/21/2014 0521   K 4.1 09/17/2013 1158   CL 106 06/21/2014 0521   CL 107 09/19/2012 1325   CO2 25 06/21/2014 0521   CO2 20 (L) 09/17/2013 1158   GLUCOSE 105 (H) 06/21/2014 0521   GLUCOSE  77 09/17/2013 1158   GLUCOSE 97 09/19/2012 1325   BUN 19 06/21/2014 0521   BUN 17.4 09/17/2013 1158   CREATININE 0.90 06/29/2016 1201   CREATININE 0.9 09/17/2013 1158   CALCIUM 8.6 06/21/2014 0521   CALCIUM 9.3 09/17/2013 1158   PROT 8.5 (H) 06/21/2014 0521   PROT 8.5 (H) 09/17/2013 1158   ALBUMIN 3.4 (L) 06/21/2014 0521   ALBUMIN 3.5 09/17/2013 1158   AST 15 06/21/2014 0521   AST 10 09/17/2013 1158   ALT 8 06/21/2014 0521   ALT <6 09/17/2013 1158   ALKPHOS 129 (H) 06/21/2014 0521   ALKPHOS 151 (H) 09/17/2013 1158   BILITOT 0.3 06/21/2014 0521   BILITOT 0.23 09/17/2013 1158   GFRNONAA >90 06/21/2014 0521   GFRAA >90 06/21/2014 0521    No results found for: SPEP, UPEP  Lab Results  Component Value Date   WBC 2.5 (L) 07/05/2016   NEUTROABS 1.4 (L) 07/05/2016   HGB 8.6 (L) 07/05/2016   HCT 26.3 (  L) 07/05/2016   MCV 117.5 (H) 07/05/2016   PLT 304 07/05/2016      Chemistry      Component Value Date/Time   NA 139 06/21/2014 0521   NA 139 09/17/2013 1158   K 3.7 06/21/2014 0521   K 4.1 09/17/2013 1158   CL 106 06/21/2014 0521   CL 107 09/19/2012 1325   CO2 25 06/21/2014 0521   CO2 20 (L) 09/17/2013 1158   BUN 19 06/21/2014 0521   BUN 17.4 09/17/2013 1158   CREATININE 0.90 06/29/2016 1201   CREATININE 0.9 09/17/2013 1158      Component Value Date/Time   CALCIUM 8.6 06/21/2014 0521   CALCIUM 9.3 09/17/2013 1158   ALKPHOS 129 (H) 06/21/2014 0521   ALKPHOS 151 (H) 09/17/2013 1158   AST 15 06/21/2014 0521   AST 10 09/17/2013 1158   ALT 8 06/21/2014 0521   ALT <6 09/17/2013 1158   BILITOT 0.3 06/21/2014 0521   BILITOT 0.23 09/17/2013 1158       RADIOGRAPHIC STUDIES: I have reviewed the imaging study with the patient and his wife I have personally reviewed the radiological images as listed and agreed with the findings in the report. Mr [name] Wo Contrast  Result Date: 06/29/2016 CLINICAL DATA:  Staging lung cancer. EXAM: MRI HEAD WITHOUT AND WITH CONTRAST  TECHNIQUE: Multiplanar, multiecho pulse sequences of the brain and surrounding structures were obtained without and with intravenous contrast. CONTRAST:  10m MULTIHANCE GADOBENATE DIMEGLUMINE 529 MG/ML IV SOLN COMPARISON:  Head CT 06/21/2014.  MRI 002-Oct-2015 FINDINGS: Brain: Pronounced generalized brain atrophy. Chronic small-vessel ischemic changes affecting the pons and throughout the cerebral hemispheric deep and subcortical white matter. Old infarction in the left basal ganglia. Old right occipital cortical and subcortical infarction. Old infarction right external capsule with hemosiderin deposition. No evidence of primary or metastatic mass lesion, recent hemorrhage, obstructive hydrocephalus or extra-axial collection. On FLAIR imaging only, some of the gyri a appear to show increased FLAIR intensity. These are most notable on the frontal regions but are present scattered about. I have discussed this with other neuro radiology specialists in we think that this could be artifactual. However, the differential diagnosis does include true pathologies including resolving posterior reversible encephalopathy, viral or prion disease, postictal change and post chemotherapy change. If there is concern about active CNS pathology, re- scanning in 4-6 weeks, preferentially on a 3 tesla magnet, could help sort this out. Vascular: Major vessels at the base of the brain show flow. Skull and upper cervical spine: Negative Sinuses/Orbits: Mild paranasal sinus mucosal thickening. No advanced disease. Orbits negative. Other: None significant IMPRESSION: No evidence of metastatic disease or recent infarction. Advanced generalized brain atrophy with moderate to marked chronic small-vessel ischemic changes throughout. FLAIR imaging appears to show scattered gyri showing FLAIR hyperintensity. These abnormalities are not confirmed with restricted diffusion or contrast enhancement. Therefore, we are not certain that this is not  artifactual. The differential diagnosis does include true pathology such as posterior reversible encephalopathy, viral or prion disease, postictal change in post chemotherapy change. If there is concern about active CNS pathology, re- scanning in 4-6 weeks could be useful. Electronically Signed   By: MNelson ChimesM.D.   On: 06/29/2016 12:49   Nm Pet Image Initial (pi) Skull Base To Thigh  Result Date: 07/11/2016 CLINICAL DATA:  Initial treatment strategy for pulmonary nodules. EXAM: NUCLEAR MEDICINE PET SKULL BASE TO THIGH TECHNIQUE: 5.4 mCi F-18 FDG was injected intravenously. Full-ring PET imaging was performed  from the skull base to thigh after the radiotracer. CT data was obtained and used for attenuation correction and anatomic localization. FASTING BLOOD GLUCOSE:  Value: 50 mg/dl COMPARISON:  23-Jul-202018 FINDINGS: NECK No hypermetabolic lymph nodes in the neck. CHEST Normal heart size. No pericardial effusion. Aortic atherosclerosis. Calcifications in the LAD and left circumflex and RCA coronary arteries noted. No hypermetabolic mediastinal or hilar nodes. Advanced changes of centrilobular and paraseptal emphysema. Spiculated nodule in the left upper lobe measures 2.5 cm and has an SUV max equal to 11.85. The lingular mass measures 2.7 cm and has an SUV max equal to 16.16 within the right lower lobe there is a posterolateral subpleural nodule measuring 1.6 cm. SUV max is equal to 11.5. Adjacent nodule within the superior segment of the right lower lobe measures 2 cm and has an SUV max equal to 12.08. Scarring identified within the right upper lobe. No suspicious pulmonary nodules on the CT scan. ABDOMEN/PELVIS No abnormal hypermetabolic activity within the liver, pancreas, adrenal glands, or spleen. Aortic atherosclerosis. No hypermetabolic lymph nodes in the abdomen or pelvis. SKELETON No focal hypermetabolic activity to suggest skeletal metastasis. IMPRESSION: 1. There are 2 nodules in the left upper lobe  and 2 nodules within the right lower lobe which have spiculated margin and exhibit intense radiotracer uptake compatible with multifocal pulmonary metastasis versus multiple synchronous primary pulmonary neoplasms. 2. Emphysema 3. Aortic atherosclerosis and multi vessel coronary artery calcification. Electronically Signed   By: Kerby Moors M.D.   On: 07/11/2016 11:29    ASSESSMENT & PLAN:  History of head and neck cancer I reviewed multiple imaging study with the patient and his wife. MRI of the head is negative. PET CT scan show bilateral pulmonary masses, differential diagnosis included possible bronchogenic carcinoma versus metastatic spread It is possible that the metastatic disease seen in the lungs could be related to his prior head and neck cancer 3 years ago. However, given his smoking history, primary bronchogenic carcinoma cannot be excluded. In any case, I discussed the rationale of pursuing lung biopsy with the patient. I have discussed the imaging study with the radiologist. Per recommendation from radiologist, we will proceed with left upper lobe CT-guided biopsy for diagnostic purposes. I will see him back after results are available  MDS (myelodysplastic syndrome), low grade The anemia from MDS is stable. I suspect he may have some degree of bone marrow suppression from his antiseizure medications. He had responded well to Darbopeitin in the past with improvement in energy level. The patient will return every 3 weeks to get blood work checked and he will get injection to keep hemoglobin greater than 10 g.  Tobacco abuse I spent some time counseling the patient the importance of tobacco cessation. he is currently attempting to quit on his own   Malignant cachexia [facility]) The patient has poor appetite.  He is not able to gain weight. We discussed appetite stimulant with low-dose prednisone.  He agreed to try.  Metastasis to lung of unknown origin, left [facility]) The patient  is currently not symptomatic from pulmonary metastases. As above, we will pursue CT-guided biopsy   Orders Placed This Encounter  Procedures  . CT BIOPSY    Standing Status:   Future    Standing Expiration Date:   08/15/2017    Order Specific Question:   Reason for Exam (SYMPTOM  OR DIAGNOSIS REQUIRED)    Answer:   lung mets versus primary lung, for biopsy LUL nodule, spoke with Dr. Vernard Gambles  Order Specific Question:   Preferred imaging location?    Answer:   [facility]   All questions were answered. The patient knows to call the clinic with any problems, questions or concerns. No barriers to learning was detected. I spent 25 minutes counseling the patient face to face. The total time spent in the appointment was 40 minutes and more than 50% was on counseling and review of test results     [name], MD 07/12/2016 7:01 AM

## 2016-07-23 ENCOUNTER — Telehealth: Payer: Self-pay | Admitting: Hematology and Oncology

## 2016-07-23 NOTE — Telephone Encounter (Signed)
Left message for patient re 3/26 f/u. Patient to get new schedule at 3/20 visit.

## 2016-07-26 ENCOUNTER — Other Ambulatory Visit (HOSPITAL_BASED_OUTPATIENT_CLINIC_OR_DEPARTMENT_OTHER): Payer: Medicare Other

## 2016-07-26 ENCOUNTER — Other Ambulatory Visit: Payer: Self-pay | Admitting: Radiology

## 2016-07-26 ENCOUNTER — Other Ambulatory Visit: Payer: Self-pay | Admitting: General Surgery

## 2016-07-26 ENCOUNTER — Ambulatory Visit (HOSPITAL_BASED_OUTPATIENT_CLINIC_OR_DEPARTMENT_OTHER): Payer: Medicare Other

## 2016-07-26 ENCOUNTER — Ambulatory Visit: Payer: Self-pay | Admitting: Hematology and Oncology

## 2016-07-26 ENCOUNTER — Other Ambulatory Visit: Payer: Self-pay | Admitting: Student

## 2016-07-26 VITALS — BP 140/78 | HR 74 | Temp 97.5°F | Resp 20

## 2016-07-26 DIAGNOSIS — D4620 Refractory anemia with excess of blasts, unspecified: Secondary | ICD-10-CM

## 2016-07-26 LAB — CBC WITH DIFFERENTIAL/PLATELET
BASO%: 0.9 % (ref 0.0–2.0)
BASOS ABS: 0 10*3/uL (ref 0.0–0.1)
EOS%: 1.3 % (ref 0.0–7.0)
Eosinophils Absolute: 0.1 10*3/uL (ref 0.0–0.5)
HEMATOCRIT: 27.6 % — AB (ref 38.4–49.9)
HEMOGLOBIN: 9 g/dL — AB (ref 13.0–17.1)
LYMPH#: 0.6 10*3/uL — AB (ref 0.9–3.3)
LYMPH%: 13.9 % — ABNORMAL LOW (ref 14.0–49.0)
MCH: 38.4 pg — AB (ref 27.2–33.4)
MCHC: 32.7 g/dL (ref 32.0–36.0)
MCV: 117.6 fL — ABNORMAL HIGH (ref 79.3–98.0)
MONO#: 0.4 10*3/uL (ref 0.1–0.9)
MONO%: 9.8 % (ref 0.0–14.0)
NEUT#: 3 10*3/uL (ref 1.5–6.5)
NEUT%: 74.1 % (ref 39.0–75.0)
PLATELETS: 254 10*3/uL (ref 140–400)
RBC: 2.34 10*6/uL — ABNORMAL LOW (ref 4.20–5.82)
RDW: 18.4 % — ABNORMAL HIGH (ref 11.0–14.6)
WBC: 4.1 10*3/uL (ref 4.0–10.3)

## 2016-07-26 MED ORDER — DARBEPOETIN ALFA 500 MCG/ML IJ SOSY
500.0000 ug | PREFILLED_SYRINGE | Freq: Once | INTRAMUSCULAR | Status: AC
  Administered 2016-07-26: 500 ug via SUBCUTANEOUS
  Filled 2016-07-26: qty 1

## 2016-07-26 NOTE — Patient Instructions (Signed)
Darbepoetin Alfa injection (Aranesp) What is this medicine? DARBEPOETIN ALFA (dar be POE e tin AL fa) helps your body make more red blood cells. It is used to treat anemia caused by chronic kidney failure and chemotherapy. This medicine may be used for other purposes; ask your health care provider or pharmacist if you have questions. What should I tell my health care provider before I take this medicine? They need to know if you have any of these conditions: -blood clotting disorders or history of blood clots -cancer patient not on chemotherapy -cystic fibrosis -heart disease, such as angina, heart failure, or a history of a heart attack -hemoglobin level of 12 g/dL or greater -high blood pressure -low levels of folate, iron, or vitamin B12 -seizures -an unusual or allergic reaction to darbepoetin, erythropoietin, albumin, hamster proteins, latex, other medicines, foods, dyes, or preservatives -pregnant or trying to get pregnant -breast-feeding How should I use this medicine? This medicine is for injection into a vein or under the skin. It is usually given by a health care professional in a hospital or clinic setting. If you get this medicine at home, you will be taught how to prepare and give this medicine. Do not shake the solution before you withdraw a dose. Use exactly as directed. Take your medicine at regular intervals. Do not take your medicine more often than directed. It is important that you put your used needles and syringes in a special sharps container. Do not put them in a trash can. If you do not have a sharps container, call your pharmacist or healthcare provider to get one. Talk to your pediatrician regarding the use of this medicine in children. While this medicine may be used in children as young as 1 year for selected conditions, precautions do apply. Overdosage: If you think you have taken too much of this medicine contact a poison control center or emergency room at  once. NOTE: This medicine is only for you. Do not share this medicine with others. What if I miss a dose? If you miss a dose, take it as soon as you can. If it is almost time for your next dose, take only that dose. Do not take double or extra doses. What may interact with this medicine? Do not take this medicine with any of the following medications: -epoetin alfa This list may not describe all possible interactions. Give your health care provider a list of all the medicines, herbs, non-prescription drugs, or dietary supplements you use. Also tell them if you smoke, drink alcohol, or use illegal drugs. Some items may interact with your medicine. What should I watch for while using this medicine? Visit your prescriber or health care professional for regular checks on your progress and for the needed blood tests and blood pressure measurements. It is especially important for the doctor to make sure your hemoglobin level is in the desired range, to limit the risk of potential side effects and to give you the best benefit. Keep all appointments for any recommended tests. Check your blood pressure as directed. Ask your doctor what your blood pressure should be and when you should contact him or her. As your body makes more red blood cells, you may need to take iron, folic acid, or vitamin B supplements. Ask your doctor or health care provider which products are right for you. If you have kidney disease continue dietary restrictions, even though this medication can make you feel better. Talk with your doctor or health care professional about  the foods you eat and the vitamins that you take. What side effects may I notice from receiving this medicine? Side effects that you should report to your doctor or health care professional as soon as possible: -allergic reactions like skin rash, itching or hives, swelling of the face, lips, or tongue -breathing problems -changes in vision -chest pain -confusion,  trouble speaking or understanding -feeling faint or lightheaded, falls -high blood pressure -muscle aches or pains -pain, swelling, warmth in the leg -rapid weight gain -severe headaches -sudden numbness or weakness of the face, arm or leg -trouble walking, dizziness, loss of balance or coordination -seizures (convulsions) -swelling of the ankles, feet, hands -unusually weak or tired Side effects that usually do not require medical attention (report to your doctor or health care professional if they continue or are bothersome): -diarrhea -fever, chills (flu-like symptoms) -headaches -nausea, vomiting -redness, stinging, or swelling at site where injected This list may not describe all possible side effects. Call your doctor for medical advice about side effects. You may report side effects to FDA at 1-800-FDA-1088. Where should I keep my medicine? Keep out of the reach of children. Store in a refrigerator between 2 and 8 degrees C (36 and 46 degrees F). Do not freeze. Do not shake. Throw away any unused portion if using a single-dose vial. Throw away any unused medicine after the expiration date. NOTE: This sheet is a summary. It may not cover all possible information. If you have questions about this medicine, talk to your doctor, pharmacist, or health care provider.    2016, [employer]vier/[employer] Standard. (2008-04-08 10:23:57)

## 2016-07-27 ENCOUNTER — Telehealth: Payer: Self-pay | Admitting: Hematology and Oncology

## 2016-07-27 ENCOUNTER — Telehealth: Payer: Self-pay | Admitting: Emergency Medicine

## 2016-07-27 ENCOUNTER — Ambulatory Visit (HOSPITAL_COMMUNITY)
Admission: RE | Admit: 2016-07-27 | Discharge: 2016-07-27 | Disposition: A | Discharge status: Home / Self Care | Payer: Medicare Other | Source: Ambulatory Visit | Attending: Hematology and Oncology | Admitting: Hematology and Oncology

## 2016-07-27 ENCOUNTER — Other Ambulatory Visit: Payer: Self-pay | Admitting: Hematology and Oncology

## 2016-07-27 ENCOUNTER — Telehealth: Payer: Self-pay

## 2016-07-27 DIAGNOSIS — J439 Emphysema, unspecified: Secondary | ICD-10-CM | POA: Insufficient documentation

## 2016-07-27 DIAGNOSIS — M1990 Unspecified osteoarthritis, unspecified site: Secondary | ICD-10-CM | POA: Insufficient documentation

## 2016-07-27 DIAGNOSIS — C801 Malignant (primary) neoplasm, unspecified: Secondary | ICD-10-CM | POA: Diagnosis not present

## 2016-07-27 DIAGNOSIS — Z923 Personal history of irradiation: Secondary | ICD-10-CM | POA: Diagnosis not present

## 2016-07-27 DIAGNOSIS — J984 Other disorders of lung: Secondary | ICD-10-CM | POA: Insufficient documentation

## 2016-07-27 DIAGNOSIS — Z79899 Other long term (current) drug therapy: Secondary | ICD-10-CM | POA: Insufficient documentation

## 2016-07-27 DIAGNOSIS — C7802 Secondary malignant neoplasm of left lung: Secondary | ICD-10-CM

## 2016-07-27 DIAGNOSIS — F419 Anxiety disorder, unspecified: Secondary | ICD-10-CM | POA: Diagnosis not present

## 2016-07-27 DIAGNOSIS — R911 Solitary pulmonary nodule: Secondary | ICD-10-CM | POA: Diagnosis not present

## 2016-07-27 DIAGNOSIS — K219 Gastro-esophageal reflux disease without esophagitis: Secondary | ICD-10-CM | POA: Diagnosis not present

## 2016-07-27 DIAGNOSIS — F17210 Nicotine dependence, cigarettes, uncomplicated: Secondary | ICD-10-CM | POA: Diagnosis not present

## 2016-07-27 LAB — CBC WITH DIFFERENTIAL/PLATELET
Basophils Absolute: 0 10*3/uL (ref 0.0–0.1)
Basophils Relative: 1 %
EOS PCT: 2 %
Eosinophils Absolute: 0.1 10*3/uL (ref 0.0–0.7)
HCT: 27.4 % — ABNORMAL LOW (ref 39.0–52.0)
Hemoglobin: 8.7 g/dL — ABNORMAL LOW (ref 13.0–17.0)
Lymphocytes Relative: 22 %
Lymphs Abs: 0.8 10*3/uL (ref 0.7–4.0)
MCH: 37 pg — ABNORMAL HIGH (ref 26.0–34.0)
MCHC: 31.8 g/dL (ref 30.0–36.0)
MCV: 116.6 fL — ABNORMAL HIGH (ref 78.0–100.0)
MONO ABS: 0.4 10*3/uL (ref 0.1–1.0)
Monocytes Relative: 11 %
NEUTROS PCT: 64 %
Neutro Abs: 2.2 10*3/uL (ref 1.7–7.7)
PLATELETS: 237 10*3/uL (ref 150–400)
RBC: 2.35 MIL/uL — AB (ref 4.22–5.81)
RDW: 17.2 % — ABNORMAL HIGH (ref 11.5–15.5)
WBC: 3.5 10*3/uL — AB (ref 4.0–10.5)

## 2016-07-27 LAB — BASIC METABOLIC PANEL
Anion gap: 7 (ref 5–15)
BUN: 15 mg/dL (ref 6–20)
CHLORIDE: 107 mmol/L (ref 101–111)
CO2: 23 mmol/L (ref 22–32)
CREATININE: 0.85 mg/dL (ref 0.61–1.24)
Calcium: 8.9 mg/dL (ref 8.9–10.3)
GFR calc non Af Amer: >60 mL/min (ref >60)
Glucose, Bld: 84 mg/dL (ref 65–99)
POTASSIUM: 4.1 mmol/L (ref 3.5–5.1)
SODIUM: 137 mmol/L (ref 135–145)

## 2016-07-27 LAB — PROTIME-INR
INR: 1.23
PROTHROMBIN TIME: 15.5 s — AB (ref 11.4–15.2)

## 2016-07-27 MED ORDER — MIDAZOLAM HCL 2 MG/2ML IJ SOLN
INTRAMUSCULAR | Status: AC
  Filled 2016-07-27: qty 2

## 2016-07-27 MED ORDER — FENTANYL CITRATE (PF) 100 MCG/2ML IJ SOLN
INTRAMUSCULAR | Status: AC
  Filled 2016-07-27: qty 2

## 2016-07-27 MED ORDER — SODIUM CHLORIDE 0.9 % IV SOLN
INTRAVENOUS | Status: DC
  Administered 2016-07-27: 10:00:00 via INTRAVENOUS

## 2016-07-27 MED ORDER — LIDOCAINE HCL 1 % IJ SOLN
INTRAMUSCULAR | Status: AC
  Filled 2016-07-27: qty 20

## 2016-07-27 NOTE — H&P (Signed)
Chief Complaint: Patient was seen in consultation today for lung mass  Referring Physician(s):  Dr. [name]  Supervising Physician: Markus Daft  Patient Status: [facility] - Out-pt  History of Present Illness: Adrian [name] is a 66 y.o. 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   CT Chest 06/09/16: -Bilateral spiculated soft tissue pulmonary masses, highly suspicious for multifocal malignancy. -Borderline enlarged left-sided mediastinal lymphadenopathy. -Background of severe emphysematous changes, chronic cylindrical bronchiectasis and hyperinflation of the lungs. Findings consistent with longstanding COPD. -Diffusely thickened interstitial markings. This may represent chronic interstitial lung changes, however lymphangitic spread of malignancy cannot be excluded. -Anterior compression deformities of several of the thoracic vertebral bodies, with heterogeneous appearance of T7 and T9 vertebral body. This may represent degenerative changes, however osseous metastatic disease cannot be excluded.  PET scan 07/11/16: 1. There are 2 nodules in the left upper lobe and 2 nodules within the right lower lobe which have spiculated margin and exhibit intense radiotracer uptake compatible with multifocal pulmonary metastasis versus multiple synchronous primary pulmonary neoplasms. 2. Emphysema 3. Aortic atherosclerosis and multi vessel coronary artery calcification.  IR consulted for lung mass biopsy at the request of Dr. [name].  Case reviewed by Dr. Vernard Gambles who approves patient for LUL lung nodule biopsy but notes procedure is high risk.   Patient has been NPO.  He does does not take blood thinners.  He has been in his usual state of health.   Past Medical History:  Diagnosis Date  . Anxiety   . Arthritis      rheumatoid  . Cancer ([place]) 03/11/2013   larnyx/epiglottic Squamous cell in situ  . Emphysema of lung ([place])   . Esophageal reflux   . History of blood transfusion   . History of radiation therapy 05/13/2013-06/28/2013   68 gray to epiglottis/neck  . Megaloblastic anemia   . Pneumonia 01/2013  . Seizures ([place])    last one was 8 years ago  . Shortness of breath    with exertion  . Throat pain 06/13/2013    Past Surgical History:  Procedure Laterality Date  . ABDOMINAL HERNIA REPAIR    . COLONOSCOPY    . MULTIPLE EXTRACTIONS WITH ALVEOLOPLASTY N/A 04/16/2013   Procedure: Extraction of tooth #'s 2,5,6,7,10,11,15,17,21,22,27,29 with alveoloplasty, bilateral maxillary fibrous tuberosity reductions, removal of excess tissues of mandibular arch, and mandibular left lingual exostoses reductions.;  Surgeon: Lenn Cal, DDS;  Location: [facility] OR;  Service: Oral Surgery;  Laterality: N/A;  . PEG PLACEMENT  2014  . tracheostomy  2014    Allergies: Patient has no known allergies.  Medications: Prior to Admission medications   Medication Sig Start Date End Date Taking? Authorizing Provider  budesonide-formoterol (SYMBICORT) 80-4.5 MCG/ACT inhaler Inhale 2 puffs into the lungs 2 (two) times daily as needed (Short of breath).   Yes Historical Provider, MD  cyanocobalamin 500 MCG tablet Take 500 mcg by mouth daily.   Yes Historical Provider, MD  diazepam (VALIUM) 5 MG tablet Take 10 mg by mouth every 6 (six) hours as needed for anxiety.    Yes Historical Provider, MD  ferrous sulfate 325 (65 FE) MG tablet Take 325 mg by mouth daily with breakfast.   Yes Historical Provider, MD  levETIRAcetam (KEPPRA) 500 MG tablet Take 1 tablet (500 mg total) by mouth 2 (two) times daily. 01/07/16  Yes  Garvin Fila, MD  predniSONE (DELTASONE) 5 MG tablet Take 1 tablet (5 mg total) by mouth daily with breakfast. 07/11/16  Yes [name], MD  vitamin E 1000 UNIT capsule Take 1,000 Units by mouth daily.    Yes  Historical Provider, MD  folic acid (FOLVITE) 1 MG tablet Take 1 mg by mouth daily.    Historical Provider, MD  mometasone (NASONEX) 50 MCG/ACT nasal spray Place 2 sprays into both nostrils daily.    Historical Provider, MD     Family History  Problem Relation Age of Onset  . Hypertension Mother   . Arthritis/Rheumatoid Mother   . Stroke Mother     Social History   Social History  . Marital status: Married    Spouse name: Hassan Rowan  . Number of children: 3  . Years of education: 12th   Occupational History  . Retired Other   Social History Main Topics  . Smoking status: Current Some Day Smoker    Packs/day: 1.00    Years: 40.00    Types: Cigarettes  . Smokeless tobacco: Never Used     Comment: smoke 1 to 2 a day  . Alcohol use No  . Drug use: No  . Sexual activity: Not on file   Other Topics Concern  . Not on file   Social History Narrative   04/12/2013   The patient is married with 3 children. (2 girls and one boy)    The patient has a history of smoking a quarter pack per day for 15 years.   The patient is trying to quit on his own.   Patient has not had any alcohol for the past 6 months by report. Patient usually drinks vodka.   Caffeine Use: 2 cups daily              Review of Systems  Constitutional: Negative for fatigue and fever.  Respiratory: Positive for shortness of breath (chronic). Negative for cough.   Cardiovascular: Negative for chest pain.  Gastrointestinal: Negative for abdominal pain.  Psychiatric/Behavioral: Negative for behavioral problems and confusion.    Vital Signs: BP 135/70   Pulse 69   Temp 97.3 F (36.3 C)   Resp 18   Ht 6' (1.829 m)   Wt 118 lb (53.5 kg)   SpO2 99%   BMI 16.00 kg/m   Physical Exam  Constitutional: He is oriented to person, place, and time. He appears well-developed.  Evidence of muscle wasting and subcutaneous fat loss  Cardiovascular: Normal rate, regular rhythm and normal heart sounds.     Pulmonary/Chest: Effort normal and breath sounds normal. No respiratory distress.  Abdominal: Soft.  Neurological: He is alert and oriented to person, place, and time.  Skin: Skin is warm and dry.  Psychiatric: He has a normal mood and affect. His behavior is normal. Judgment and thought content normal.  Nursing note and vitals reviewed.   Mallampati Score:  MD Evaluation Airway: WNL Heart: WNL Abdomen: WNL Chest/ Lungs: WNL ASA  Classification: 3 Mallampati/Airway Score: One  Imaging: Mr [name] YB Contrast  Result Date: 06/29/2016 CLINICAL DATA:  Staging lung cancer. EXAM: MRI HEAD WITHOUT AND WITH CONTRAST TECHNIQUE: Multiplanar, multiecho pulse sequences of the brain and surrounding structures were obtained without and with intravenous contrast. CONTRAST:  93m MULTIHANCE GADOBENATE DIMEGLUMINE 529 MG/ML IV SOLN COMPARISON:  Head CT 06/21/2014.  MRI 003-Oct-2015 FINDINGS: Brain: Pronounced generalized brain atrophy. Chronic small-vessel ischemic changes affecting the pons and throughout the cerebral hemispheric deep  and subcortical white matter. Old infarction in the left basal ganglia. Old right occipital cortical and subcortical infarction. Old infarction right external capsule with hemosiderin deposition. No evidence of primary or metastatic mass lesion, recent hemorrhage, obstructive hydrocephalus or extra-axial collection. On FLAIR imaging only, some of the gyri a appear to show increased FLAIR intensity. These are most notable on the frontal regions but are present scattered about. I have discussed this with other neuro radiology specialists in we think that this could be artifactual. However, the differential diagnosis does include true pathologies including resolving posterior reversible encephalopathy, viral or prion disease, postictal change and post chemotherapy change. If there is concern about active CNS pathology, re- scanning in 4-6 weeks, preferentially on a 3 tesla magnet,  could help sort this out. Vascular: Major vessels at the base of the brain show flow. Skull and upper cervical spine: Negative Sinuses/Orbits: Mild paranasal sinus mucosal thickening. No advanced disease. Orbits negative. Other: None significant IMPRESSION: No evidence of metastatic disease or recent infarction. Advanced generalized brain atrophy with moderate to marked chronic small-vessel ischemic changes throughout. FLAIR imaging appears to show scattered gyri showing FLAIR hyperintensity. These abnormalities are not confirmed with restricted diffusion or contrast enhancement. Therefore, we are not certain that this is not artifactual. The differential diagnosis does include true pathology such as posterior reversible encephalopathy, viral or prion disease, postictal change in post chemotherapy change. If there is concern about active CNS pathology, re- scanning in 4-6 weeks could be useful. Electronically Signed   By: Nelson Chimes M.D.   On: 06/29/2016 12:49   Nm Pet Image Initial (pi) Skull Base To Thigh  Result Date: 07/11/2016 CLINICAL DATA:  Initial treatment strategy for pulmonary nodules. EXAM: NUCLEAR MEDICINE PET SKULL BASE TO THIGH TECHNIQUE: 5.4 mCi F-18 FDG was injected intravenously. Full-ring PET imaging was performed from the skull base to thigh after the radiotracer. CT data was obtained and used for attenuation correction and anatomic localization. FASTING BLOOD GLUCOSE:  Value: 50 mg/dl COMPARISON:  Sep 02, 202018 FINDINGS: NECK No hypermetabolic lymph nodes in the neck. CHEST Normal heart size. No pericardial effusion. Aortic atherosclerosis. Calcifications in the LAD and left circumflex and RCA coronary arteries noted. No hypermetabolic mediastinal or hilar nodes. Advanced changes of centrilobular and paraseptal emphysema. Spiculated nodule in the left upper lobe measures 2.5 cm and has an SUV max equal to 11.85. The lingular mass measures 2.7 cm and has an SUV max equal to 16.16 within the  right lower lobe there is a posterolateral subpleural nodule measuring 1.6 cm. SUV max is equal to 11.5. Adjacent nodule within the superior segment of the right lower lobe measures 2 cm and has an SUV max equal to 12.08. Scarring identified within the right upper lobe. No suspicious pulmonary nodules on the CT scan. ABDOMEN/PELVIS No abnormal hypermetabolic activity within the liver, pancreas, adrenal glands, or spleen. Aortic atherosclerosis. No hypermetabolic lymph nodes in the abdomen or pelvis. SKELETON No focal hypermetabolic activity to suggest skeletal metastasis. IMPRESSION: 1. There are 2 nodules in the left upper lobe and 2 nodules within the right lower lobe which have spiculated margin and exhibit intense radiotracer uptake compatible with multifocal pulmonary metastasis versus multiple synchronous primary pulmonary neoplasms. 2. Emphysema 3. Aortic atherosclerosis and multi vessel coronary artery calcification. Electronically Signed   By: Kerby Moors M.D.   On: 07/11/2016 11:29    Labs:  CBC:  Recent Labs  06/14/16 1144 07/05/16 1214 07/26/16 1105 07/27/16 0931  WBC 3.7* 2.5* 4.1 3.5*  HGB 9.2* 8.6* 9.0* 8.7*  HCT 28.4* 26.3* 27.6* 27.4*  PLT 537* 304 254 237    COAGS: No results for input(s): INR, APTT in the last 8760 hours.  BMP:  Recent Labs  06/29/16 1201 07/27/16 0931  NA  --  137  K  --  4.1  CL  --  107  CO2  --  23  GLUCOSE  --  84  BUN  --  15  CALCIUM  --  8.9  CREATININE 0.90 0.85  GFRNONAA  --  >60  GFRAA  --  >60    LIVER FUNCTION TESTS: No results for input(s): BILITOT, AST, ALT, ALKPHOS, PROT, ALBUMIN in the last 8760 hours.  TUMOR MARKERS: No results for input(s): AFPTM, CEA, CA199, CHROMGRNA in the last 8760 hours.  Assessment and Plan: Lung mass Patient with history of epiglottis squamous cell cancer s/p radiation therapy and MDS currently on low dose prednisone who presents with complaint of shortness of breath.  Recent CT Chest  and PET scan showed pulmonary masses.   IR consulted for lung mass biopsy at the request of Dr. Alvy Bimler. Case reviewed by Dr. Vernard Gambles who approves patient for procedure but notes high risk.  Patient presents for procedure today. Patient has been NPO, he does not take blood thinners, and he has been in his usual state of health.  Risks and Benefits discussed with the patient including, but not limited to bleeding, hemoptysis, respiratory failure requiring intubation, infection, pneumothorax requiring chest tube placement, stroke from air embolism or even death. All of the patient's questions were answered, patient is agreeable to proceed. Consent signed and in chart.  Thank you for this interesting consult.  I greatly enjoyed meeting Adrian [name] and look forward to participating in their care.  A copy of this report was sent to the requesting provider on this date.  Electronically Signed: Docia Barrier 07/27/2016, 10:23 AM   I spent a total of  30 Minutes   in face to face in clinical consultation, greater than 50% of which was counseling/coordinating care for lung masses.

## 2016-07-27 NOTE — Telephone Encounter (Signed)
Left below message. 

## 2016-07-27 NOTE — Telephone Encounter (Signed)
Patient needs ENB for bilateral pulmonary nodules. Hx of H&N cancer. Initially referred for TTNA, discussed w Dr Anselm Pancoast, would be reasonable to undergo ENB

## 2016-07-27 NOTE — Telephone Encounter (Signed)
I received a telephone call from interventional radiologist. The patient is deemed high risk for CT-guided biopsy. The radiologist of discussed this with Dr. Lamonte Sakai who has agreed to do navigational bronchoscopy and biopsy next week. I will reschedule his appointment to see me the following week of the biopsy report is available.  The patient will be contacted and his appointment on 08/01/2016 will be canceled.

## 2016-07-27 NOTE — Telephone Encounter (Signed)
-----   Message from [name], MD sent at 07/27/2016 12:13 PM EDT ----- Regarding: I cancelled his appt on Monday Pls see my note Please call him/wife that I have cancelled his appt on Monday and will reschedule after his bronchoscopy/biopsy

## 2016-07-29 NOTE — Telephone Encounter (Signed)
lmtcb X1 for pt to schedule asap consult double booked with RB.   Looking to double book on 3/29 AM

## 2016-07-29 NOTE — Telephone Encounter (Signed)
Please set up pt with an OV with me asap, ok to overlook.

## 2016-08-01 ENCOUNTER — Ambulatory Visit: Payer: Self-pay | Admitting: Hematology and Oncology

## 2016-08-01 ENCOUNTER — Telehealth: Payer: Self-pay | Admitting: Emergency Medicine

## 2016-08-01 NOTE — Telephone Encounter (Signed)
lmtcb X2 for pt to schedule asap consult.

## 2016-08-01 NOTE — Telephone Encounter (Signed)
Pt wife returning call again , said she got cut off when call was transferred?Adrian [name]

## 2016-08-01 NOTE — Telephone Encounter (Signed)
Patient's wife returned Ashley's call, tried to transfer her to triage nurse (twice), when I came back she was gone.

## 2016-08-01 NOTE — Telephone Encounter (Signed)
appt scheduled by Maryann Conners.  Will close encounter.

## 2016-08-01 NOTE — Telephone Encounter (Signed)
Spoke with pt's wife, pt is to come in and see RB on Thursday at 9:00.  Pt's wife verified appt, will call back if they cannot make this appt.  I am unable to physically schedule this appt d/t pt being discharged from practice in 2012.  I've spoken with Maryann Conners on this who is working on having this restriction lifted.  Will hold message in triage to follow up on.

## 2016-08-01 NOTE — Telephone Encounter (Signed)
Spoke with pt's wife, appt moved to later time with open appt time.  Will close encounter.

## 2016-08-04 ENCOUNTER — Institutional Professional Consult (permissible substitution): Payer: Self-pay | Admitting: Emergency Medicine

## 2016-08-04 ENCOUNTER — Encounter: Payer: Self-pay | Admitting: Emergency Medicine

## 2016-08-04 ENCOUNTER — Ambulatory Visit (INDEPENDENT_AMBULATORY_CARE_PROVIDER_SITE_OTHER): Payer: Medicare Other | Admitting: Emergency Medicine

## 2016-08-04 VITALS — BP 110/68 | HR 82 | Ht 72.0 in | Wt 114.4 lb

## 2016-08-04 DIAGNOSIS — J449 Chronic obstructive pulmonary disease, unspecified: Secondary | ICD-10-CM | POA: Diagnosis not present

## 2016-08-04 NOTE — Assessment & Plan Note (Signed)
Continue Symbicort Spirometry today to assess AFL, pre-op eval.

## 2016-08-04 NOTE — Progress Notes (Signed)
Subjective:    Patient ID: Adrian [name], male    DOB: 19-Sep-1950, 66 y.o.   MRN: [id number]  HPI 66 year old active smoker with a history of laryngeal CA, followed by Dr Alvy Bimler. He has a CT chest and then a PET that show B pulmonary nodules in the left upper lobe and the RLL that are hypermetabolic, superimposed on severe emphysema. He was referred for needle bx, but this was deferred due to PTX risk. He presents now for consideration of ENB. Currently on Symbicort. Pred '5mg'$  daily > he isn't sure why. Smokes 2 cigarettes 68 day. No cough, no hemoptysis. Occasional DOE./    Review of Systems  Constitutional: Positive for appetite change and unexpected weight change. Negative for fever.  HENT: Negative for congestion, dental problem, ear pain, nosebleeds, postnasal drip, rhinorrhea, sinus pressure, sneezing, sore throat and trouble swallowing.   Eyes: Negative for redness and itching.  Respiratory: Positive for cough and shortness of breath. Negative for chest tightness and wheezing.   Cardiovascular: Negative for palpitations and leg swelling.  Gastrointestinal: Negative for nausea and vomiting.  Genitourinary: Negative for dysuria.  Musculoskeletal: Negative for joint swelling.  Skin: Negative for rash.  Neurological: Negative for headaches.  Hematological: Does not bruise/bleed easily.  Psychiatric/Behavioral: Negative for dysphoric mood. The patient is nervous/anxious.    Past Medical History:  Diagnosis Date  . Anxiety   . Arthritis    rheumatoid  . Cancer ([place]) 03/11/2013   larnyx/epiglottic Squamous cell in situ  . Emphysema of lung ([place])   . Esophageal reflux   . History of blood transfusion   . History of radiation therapy 05/13/2013-06/28/2013   68 gray to epiglottis/neck  . Megaloblastic anemia   . Pneumonia 01/2013  . Seizures ([place])    last one was 8 years ago  . Shortness of breath    with exertion  . Throat pain 06/13/2013     Family History  Problem Relation  Age of Onset  . Hypertension Mother   . Arthritis/Rheumatoid Mother   . Stroke Mother      Social History   Social History  . Marital status: Married    Spouse name: Hassan Rowan  . Number of children: 3  . Years of education: 12th   Occupational History  . Retired Other   Social History Main Topics  . Smoking status: Current Some Day Smoker    Packs/day: 1.00    Years: 40.00    Types: Cigarettes  . Smokeless tobacco: Never Used     Comment: smoke 1 to 2 a day  . Alcohol use No  . Drug use: No  . Sexual activity: Not on file   Other Topics Concern  . Not on file   Social History Narrative   04/12/2013   The patient is married with 3 children. (2 girls and one boy)    The patient has a history of smoking a quarter pack per day for 15 years.   The patient is trying to quit on his own.   Patient has not had any alcohol for the past 6 months by report. Patient usually drinks vodka.   Caffeine Use: 2 cups daily              No Known Allergies   Outpatient Medications Prior to Visit  Medication Sig Dispense Refill  . budesonide-formoterol (SYMBICORT) 80-4.5 MCG/ACT inhaler Inhale 2 puffs into the lungs 2 (two) times daily as needed (Short of breath).    . cyanocobalamin  500 MCG tablet Take 500 mcg by mouth daily.    . diazepam (VALIUM) 5 MG tablet Take 10 mg by mouth every 6 (six) hours as needed for anxiety.     . ferrous sulfate 325 (65 FE) MG tablet Take 325 mg by mouth daily with breakfast.    . folic acid (FOLVITE) 1 MG tablet Take 1 mg by mouth daily.    Marland Kitchen levETIRAcetam (KEPPRA) 500 MG tablet Take 1 tablet (500 mg total) by mouth 2 (two) times daily. 60 tablet 11  . mometasone (NASONEX) 50 MCG/ACT nasal spray Place 2 sprays into both nostrils daily.    . predniSONE (DELTASONE) 5 MG tablet Take 1 tablet (5 mg total) by mouth daily with breakfast. 30 tablet 1  . vitamin E 1000 UNIT capsule Take 1,000 Units by mouth daily.      No facility-administered medications prior  to visit.    CBC    Component Value Date/Time   WBC 3.5 (L) 07/27/2016 0931   RBC 2.35 (L) 07/27/2016 0931   HGB 8.7 (L) 07/27/2016 0931   HGB 9.0 (L) 07/26/2016 1105   HCT 27.4 (L) 07/27/2016 0931   HCT 27.6 (L) 07/26/2016 1105   PLT 237 07/27/2016 0931   PLT 254 07/26/2016 1105   MCV 116.6 (H) 07/27/2016 0931   MCV 117.6 (H) 07/26/2016 1105   MCH 37.0 (H) 07/27/2016 0931   MCHC 31.8 07/27/2016 0931   RDW 17.2 (H) 07/27/2016 0931   RDW 18.4 (H) 07/26/2016 1105   LYMPHSABS 0.8 07/27/2016 0931   LYMPHSABS 0.6 (L) 07/26/2016 1105   MONOABS 0.4 07/27/2016 0931   MONOABS 0.4 07/26/2016 1105   EOSABS 0.1 07/27/2016 0931   EOSABS 0.1 07/26/2016 1105   BASOSABS 0.0 07/27/2016 0931   BASOSABS 0.0 07/26/2016 1105        Objective:   Physical Exam Vitals:   08/04/16 1051  BP: 110/68  Pulse: 82  SpO2: 96%  Weight: 114 lb 6.4 oz (51.9 kg)  Height: 6' (1.829 m)   Gen: Pleasant, thin man, in NAD, smells of cigarettes  ENT: no dysarthria, Op clear  Neck: No JVD, no stridor  Lungs: No use of accessory muscles, distant but clear, no wheeze  Cardiovascular: RRR, no M  Musculoskeletal: No deformities, no cyanosis or clubbing  Neuro: alert, non focal  Skin: Warm, no lesions or rashes   CT chest 08/06/16 --  EXAM: CT CHEST WITHOUT CONTRAST  TECHNIQUE: Multidetector CT imaging of the chest was performed following the standard protocol without IV contrast.  COMPARISON:  PET-CT 07/11/2016.  Chest CT 07-18-2016  FINDINGS: Cardiovascular: Coronary arteries demonstrate atherosclerotic calcifications. Heart size is within normal limits without significant pericardial fluid. No significant enlargement of the thoracic aorta.  Mediastinum/Nodes: No significant chest lymphadenopathy but limited evaluation on this noncontrast examination. No significant axillary lymphadenopathy.  Lungs/Pleura: There is diffuse centrilobular emphysema. Probable focal scarring in the  posterior right apex. Again noted are scattered pulmonary lesions concerning for metastatic neoplasms. The spiculated lesion in the lateral left upper lobe measures up to 2.5 cm and minimally changed. Again noted is a large irregular lesion in the lingula with a solitary calcification. This lesion measures roughly 3.6 cm. There is volume loss in left lower lobe. Again noted is a cavitary lesion along the periphery of the right lower lobe measuring roughly 1.7 cm and previously measured 1.5 cm. Again noted is a spiculated lesion in the superior segment of the right lower lobe measuring up to 2.1  cm and minimally changed.  Upper Abdomen: Again noted is a cystic structure in left kidney upper pole measuring 2.3 cm. No acute abnormality in the upper abdomen.  Musculoskeletal: No acute bone abnormality.  IMPRESSION: Bilateral pulmonary lesions. These are suspicious for metastatic disease. Lesions have minimally changed but there may be slight enlargement of the cavitary lesion in the right lower lobe.  Severe emphysematous changes.       Assessment & Plan:  Metastasis to lung of unknown origin, left (HCC) Bilateral pulmonary nodules. Unclear whether these are primary lung cancer versus metastatic disease from his head and neck cancer. Agree that there is significant risk for PTX. We should be able to perform ENB to approach both sides. He agrees after discussing the risks. Will perform spirometry to assess his pre-op risk. Will work on scheduling ENB, hopefully for next week.   COPD (chronic obstructive pulmonary disease) Continue Symbicort Spirometry today to assess AFL, pre-op eval.   Baltazar Apo, MD, PhD 08/04/2016, 11:24 AM [facility] [phone] or if no answer [phone]

## 2016-08-04 NOTE — Assessment & Plan Note (Signed)
Bilateral pulmonary nodules. Unclear whether these are primary lung cancer versus metastatic disease from his head and neck cancer. Agree that there is significant risk for PTX. We should be able to perform ENB to approach both sides. He agrees after discussing the risks. Will perform spirometry to assess his pre-op risk. Will work on scheduling ENB, hopefully for next week.

## 2016-08-04 NOTE — Patient Instructions (Addendum)
We will work on scheduling a navigational bronchoscopy, hopefully next Wednesday.  Continue Symbicort We will perform breathing testing today.

## 2016-08-08 ENCOUNTER — Encounter (HOSPITAL_COMMUNITY): Payer: Self-pay | Admitting: *Deleted

## 2016-08-09 ENCOUNTER — Telehealth: Payer: Self-pay | Admitting: Emergency Medicine

## 2016-08-09 NOTE — Telephone Encounter (Signed)
Spoke with pt's spouse and confirmed procedure for 08/10/16. Hassan Rowan states she and her husband were not made aware that this time nor date had been confirmed. I apologized to [place] for this miscommunication. Hassan Rowan states [facility] has reached out to her and explained all instructions needed for this procedure.  Nothing further needed.

## 2016-08-09 NOTE — Progress Notes (Signed)
Anesthesia Chart Review: SAME DAY WORK-UP.  Patient is a 66 year old male scheduled for video bronchoscopy with endobronchial navigation on 08/10/2016 by Dr. Lamonte Sakai. Patient with known laryngeal cancer. Bilateral pulmonary nodules noted on recent surveillance chest CT. PET scan was hypermetabolic with superimposed severe emphysema. Needle biopsy was deferred due to pneumothorax risk, and instead above procedure recommended. Differential diagnosis could include primary lung cancer versus metastatic from head and neck cancer.  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 Patient was admitted 01/23/13 with sepsis secondary to pneumonia (RUL necrotizing pneumonia), NSTEMI (peak 0.54, possible demand ischemia), acute renal failure 01/23/13. He was intubated, developed ARDS and required trach 02/03/13, PEG placed 02/11/13. Echo showed EF 45-50%, inferior wall motion abnormality, PA peak pressure 72 mmHg (cannot rule out ischemic etiology). 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   - PCP is listed as Dr. [name]. - HEM-ONC is Dr.Gorsuch, last visit 07/11/16. She felt his anemia form MDS was stable and thought there was likely some degree of bone marrow suppression from his anti-seizure medications. He gets blood work ~ every three weeks with Darbopeitin injections to keeps HGB > 10. She referred him for lung biopsy at that time. - RAD-ONC is Dr. Gery Pray. - Neurologist is Dr. Antony Contras. - ENT is Dr. [name].  Meds include Symbicort, Valium, ferrous sulfate, folic acid, Keppra, Nasonex, prednisone, vitamin E.  Currently  last EKG seen is from 06/21/14: SR, borderline right axis deviation, LVH.  Echo 01/23/13: Study Conclusions - Left ventricle: Septal dyssynergy. Akinesis base inferior wall. Severe hypokinesis inferolateral wall. The cavity size was normal. Wall thickness was increased in a pattern of mild LVH. Systolic function was mildly reduced. The estimated ejection fraction was in the range of 45% to 50%. Doppler parameters are consistent with abnormal left ventricular relaxation (grade 1 diastolic dysfunction). - Aortic valve: Mild regurgitation. - Mitral valve: Mild regurgitation. - Right ventricle: The cavity size was moderately dilated. Systolic function was mildly to moderately reduced. - Tricuspid valve: Moderate regurgitation. - Pulmonary arteries: PA peak pressure: 67m Hg (S).  Chest CT 07/27/16: IMPRESSION: Bilateral pulmonary lesions. These are suspicious for metastatic disease. Lesions have minimally changed but there may be slight enlargement of the cavitary lesion in the right lower lobe. Severe emphysematous changes.  PET Scan 07/11/16: IMPRESSION: 1. There are 2 nodules in the left upper lobe and 2 nodules within the right lower lobe which have spiculated margin and exhibit intense radiotracer uptake compatible with multifocal pulmonary metastasis versus multiple synchronous primary pulmonary neoplasms. 2. Emphysema 3. Aortic atherosclerosis and multi vessel coronary artery calcification.  Spirometry 08/04/16: FVC 2.97 (71%), FEV1 2.13 (67%), FEV1/FVC 72% (93%), FEF 25-75% 1.65 (59%). Interpretation: Mild restriction.  Labs on 07/27/16 showed: BMET WNL. H/H 8.7/27.4, stable (since 11/2013 HGB 8.3-10.4, but primarily in the mid 8-low 9 range). PT 15.5, INR 1.23. Glucose 84. He will need a T&S on the day of surgery.   Discussed with anesthesiologist Dr. TGifford Shave Patient had decreased EF with WMA and pulmonary hypertension by echo > 3 years. Echo was in the setting of severe  sepsis, but ischemic etiology could not be ruled out. He did tolerate teeth extraction under GETA later that same year. He is a  same day work-up, but to my knowledge has not had a follow-up echo or cardiology visit. Unfortunately, now with supsected lung cancer. Anesthesiologist to evaluate patient and review EKG on the day of surgery. If EKG stable and no acute CV symptoms then Dr. Gifford Shave anticipated that patient can proceed with this procedure which is needed to confirm diagnosis and formulate treatment plan.  George Hugh [facility]/Anesthesiology Phone [phone] 08/09/2016 5:17 PM

## 2016-08-09 NOTE — Anesthesia Preprocedure Evaluation (Addendum)
Anesthesia Evaluation  Patient identified by MRN, date of birth, ID band Patient awake    Airway Mallampati: II  TM Distance: >3 FB Neck ROM: Limited   Comment: larynx/epiglottis squamous cell cancer s/p radiation therapy Dental  (+) Edentulous Upper, Edentulous Lower 2014: s/p Extraction of tooth #'s 2,5,6,7,10,11,15,17,21,22,27,29 with alveoloplasty, bilateral maxillary fibrous tuberosity reductions, removal of excess tissues of mandibular arch, and mandibular left lingual exostoses reductions.;  Surgeon: Lenn Cal, DDS:   Pulmonary shortness of breath and with exertion, pneumonia, resolved, COPD,  COPD inhaler, Current Smoker,  Emphysema,Hx pneumonia and trach , laryngeal ca   breath sounds clear to auscultation       Cardiovascular Exercise Tolerance: Poor + CAD   Rhythm:Regular Rate:Normal     Neuro/Psych Seizures -, Well Controlled,  Anxiety Last seizure ~ 2010    GI/Hepatic GERD  Medicated and Controlled,  Endo/Other  currently on low dose steroids ('5mg'$  predisone/day)  Renal/GU      Musculoskeletal  (+) Arthritis , Rheumatoid disorders,    Abdominal   Peds  Hematology  (+) anemia , Megaloblastic anemia     Anesthesia Other Findings   Reproductive/Obstetrics                           BP Readings from Last 3 Encounters:  08/04/16 110/68  07/27/16 (!) 153/72  07/26/16 140/78   Lab Results  Component Value Date   WBC 3.5 (L) 07/27/2016   HGB 8.7 (L) 07/27/2016   HCT 27.4 (L) 07/27/2016   MCV 116.6 (H) 07/27/2016   PLT 237 07/27/2016     Chemistry      Component Value Date/Time   NA 137 07/27/2016 0931   NA 139 09/17/2013 1158   K 4.1 07/27/2016 0931   K 4.1 09/17/2013 1158   CL 107 07/27/2016 0931   CL 107 09/19/2012 1325   CO2 23 07/27/2016 0931   CO2 20 (L) 09/17/2013 1158   BUN 15 07/27/2016 0931   BUN 17.4 09/17/2013 1158   CREATININE 0.85 07/27/2016 0931   CREATININE 0.9 09/17/2013 1158      Component Value Date/Time   CALCIUM 8.9 07/27/2016 0931   CALCIUM 9.3 09/17/2013 1158   ALKPHOS 129 (H) 06/21/2014 0521   ALKPHOS 151 (H) 09/17/2013 1158   AST 15 06/21/2014 0521   AST 10 09/17/2013 1158   ALT 8 06/21/2014 0521   ALT <6 09/17/2013 1158   BILITOT 0.3 06/21/2014 0521   BILITOT 0.23 09/17/2013 1158     Wt Readings from Last 3 Encounters:  08/04/16 114 lb 6.4 oz (51.9 kg)  07/27/16 118 lb (53.5 kg)  07/11/16 112 lb 8 oz (51 kg)    Anesthesia Physical Anesthesia Plan  ASA: IV  Anesthesia Plan: General   Post-op Pain Management:    Induction:   Airway Management Planned: Oral ETT  Additional Equipment:   Intra-op Plan:   Post-operative Plan: Extubation in OR and Possible Post-op intubation/ventilation  Informed Consent: I have reviewed the patients History and Physical, chart, labs and discussed the procedure including the risks, benefits and alternatives for the proposed anesthesia with the patient or authorized representative who has indicated his/her understanding and acceptance.     Plan Discussed with: CRNA  Anesthesia Plan Comments:        Anesthesia Quick Evaluation

## 2016-08-10 ENCOUNTER — Encounter (HOSPITAL_COMMUNITY): Payer: Self-pay | Admitting: *Deleted

## 2016-08-10 ENCOUNTER — Ambulatory Visit (HOSPITAL_COMMUNITY): Payer: Medicare Other | Admitting: Certified Registered Nurse Anesthetist

## 2016-08-10 ENCOUNTER — Ambulatory Visit (HOSPITAL_COMMUNITY)
Admission: RE | Admit: 2016-08-10 | Discharge: 2016-08-10 | Disposition: A | Discharge status: Home / Self Care | Payer: Medicare Other | Source: Ambulatory Visit | Attending: Emergency Medicine | Admitting: Emergency Medicine

## 2016-08-10 ENCOUNTER — Telehealth: Payer: Self-pay | Admitting: Hematology and Oncology

## 2016-08-10 ENCOUNTER — Ambulatory Visit (HOSPITAL_COMMUNITY): Payer: Medicare Other

## 2016-08-10 ENCOUNTER — Encounter (HOSPITAL_COMMUNITY)
Admission: RE | Disposition: A | Discharge status: Home / Self Care | Payer: Self-pay | Source: Ambulatory Visit | Attending: Emergency Medicine

## 2016-08-10 DIAGNOSIS — C7802 Secondary malignant neoplasm of left lung: Secondary | ICD-10-CM

## 2016-08-10 DIAGNOSIS — R911 Solitary pulmonary nodule: Secondary | ICD-10-CM | POA: Diagnosis not present

## 2016-08-10 DIAGNOSIS — F17210 Nicotine dependence, cigarettes, uncomplicated: Secondary | ICD-10-CM | POA: Diagnosis not present

## 2016-08-10 DIAGNOSIS — Z7952 Long term (current) use of systemic steroids: Secondary | ICD-10-CM | POA: Insufficient documentation

## 2016-08-10 DIAGNOSIS — J432 Centrilobular emphysema: Secondary | ICD-10-CM | POA: Insufficient documentation

## 2016-08-10 DIAGNOSIS — Z8521 Personal history of malignant neoplasm of larynx: Secondary | ICD-10-CM | POA: Diagnosis not present

## 2016-08-10 DIAGNOSIS — K219 Gastro-esophageal reflux disease without esophagitis: Secondary | ICD-10-CM | POA: Diagnosis not present

## 2016-08-10 DIAGNOSIS — Z923 Personal history of irradiation: Secondary | ICD-10-CM | POA: Insufficient documentation

## 2016-08-10 DIAGNOSIS — I252 Old myocardial infarction: Secondary | ICD-10-CM | POA: Insufficient documentation

## 2016-08-10 DIAGNOSIS — M1990 Unspecified osteoarthritis, unspecified site: Secondary | ICD-10-CM | POA: Insufficient documentation

## 2016-08-10 DIAGNOSIS — C3412 Malignant neoplasm of upper lobe, left bronchus or lung: Secondary | ICD-10-CM | POA: Diagnosis not present

## 2016-08-10 DIAGNOSIS — R569 Unspecified convulsions: Secondary | ICD-10-CM | POA: Insufficient documentation

## 2016-08-10 DIAGNOSIS — D1431 Benign neoplasm of right bronchus and lung: Secondary | ICD-10-CM | POA: Diagnosis not present

## 2016-08-10 DIAGNOSIS — Z79899 Other long term (current) drug therapy: Secondary | ICD-10-CM | POA: Diagnosis not present

## 2016-08-10 DIAGNOSIS — Z419 Encounter for procedure for purposes other than remedying health state, unspecified: Secondary | ICD-10-CM

## 2016-08-10 DIAGNOSIS — F419 Anxiety disorder, unspecified: Secondary | ICD-10-CM | POA: Diagnosis not present

## 2016-08-10 DIAGNOSIS — M069 Rheumatoid arthritis, unspecified: Secondary | ICD-10-CM | POA: Diagnosis not present

## 2016-08-10 DIAGNOSIS — C3492 Malignant neoplasm of unspecified part of left bronchus or lung: Secondary | ICD-10-CM | POA: Diagnosis not present

## 2016-08-10 DIAGNOSIS — Z7951 Long term (current) use of inhaled steroids: Secondary | ICD-10-CM | POA: Insufficient documentation

## 2016-08-10 DIAGNOSIS — C801 Malignant (primary) neoplasm, unspecified: Secondary | ICD-10-CM

## 2016-08-10 DIAGNOSIS — R918 Other nonspecific abnormal finding of lung field: Secondary | ICD-10-CM

## 2016-08-10 DIAGNOSIS — Z9889 Other specified postprocedural states: Secondary | ICD-10-CM

## 2016-08-10 DIAGNOSIS — J449 Chronic obstructive pulmonary disease, unspecified: Secondary | ICD-10-CM | POA: Diagnosis not present

## 2016-08-10 HISTORY — PX: VIDEO BRONCHOSCOPY WITH ENDOBRONCHIAL NAVIGATION: SHX6175

## 2016-08-10 HISTORY — PX: MINOR PLACEMENT OF FIDUCIAL: SHX6748

## 2016-08-10 LAB — CBC
HEMATOCRIT: 26 % — AB (ref 39.0–52.0)
Hemoglobin: 8.2 g/dL — ABNORMAL LOW (ref 13.0–17.0)
MCH: 36.6 pg — AB (ref 26.0–34.0)
MCHC: 31.5 g/dL (ref 30.0–36.0)
MCV: 116.1 fL — AB (ref 78.0–100.0)
Platelets: 289 10*3/uL (ref 150–400)
RBC: 2.24 MIL/uL — ABNORMAL LOW (ref 4.22–5.81)
RDW: 16.8 % — AB (ref 11.5–15.5)
WBC: 3.3 10*3/uL — AB (ref 4.0–10.5)

## 2016-08-10 SURGERY — VIDEO BRONCHOSCOPY WITH ENDOBRONCHIAL NAVIGATION
Anesthesia: General

## 2016-08-10 MED ORDER — ONDANSETRON HCL 4 MG/2ML IJ SOLN
INTRAMUSCULAR | Status: DC | PRN
  Administered 2016-08-10: 4 mg via INTRAVENOUS

## 2016-08-10 MED ORDER — LIDOCAINE 2% (20 MG/ML) 5 ML SYRINGE
INTRAMUSCULAR | Status: DC | PRN
  Administered 2016-08-10: 70 mg via INTRAVENOUS

## 2016-08-10 MED ORDER — ROCURONIUM BROMIDE 50 MG/5ML IV SOSY
PREFILLED_SYRINGE | INTRAVENOUS | Status: AC
  Filled 2016-08-10: qty 5

## 2016-08-10 MED ORDER — FENTANYL CITRATE (PF) 250 MCG/5ML IJ SOLN
INTRAMUSCULAR | Status: AC
  Filled 2016-08-10: qty 5

## 2016-08-10 MED ORDER — SUGAMMADEX SODIUM 200 MG/2ML IV SOLN
INTRAVENOUS | Status: DC | PRN
  Administered 2016-08-10: 100 mg via INTRAVENOUS

## 2016-08-10 MED ORDER — HYDROMORPHONE HCL 1 MG/ML IJ SOLN: 0.2500 mg | INTRAMUSCULAR | Status: DC | PRN

## 2016-08-10 MED ORDER — DEXAMETHASONE SODIUM PHOSPHATE 10 MG/ML IJ SOLN
INTRAMUSCULAR | Status: AC
  Filled 2016-08-10: qty 1

## 2016-08-10 MED ORDER — PROMETHAZINE HCL 25 MG/ML IJ SOLN: 6.2500 mg | INTRAMUSCULAR | Status: DC | PRN

## 2016-08-10 MED ORDER — MIDAZOLAM HCL 2 MG/2ML IJ SOLN
INTRAMUSCULAR | Status: AC
  Filled 2016-08-10: qty 2

## 2016-08-10 MED ORDER — PHENYLEPHRINE 40 MCG/ML (10ML) SYRINGE FOR IV PUSH (FOR BLOOD PRESSURE SUPPORT)
PREFILLED_SYRINGE | INTRAVENOUS | Status: AC
  Filled 2016-08-10: qty 10

## 2016-08-10 MED ORDER — DEXAMETHASONE SODIUM PHOSPHATE 10 MG/ML IJ SOLN
INTRAMUSCULAR | Status: DC | PRN
  Administered 2016-08-10: 10 mg via INTRAVENOUS

## 2016-08-10 MED ORDER — LACTATED RINGERS IV SOLN
INTRAVENOUS | Status: DC
  Administered 2016-08-10 (×2): via INTRAVENOUS

## 2016-08-10 MED ORDER — PHENYLEPHRINE HCL 10 MG/ML IJ SOLN
INTRAVENOUS | Status: DC | PRN
  Administered 2016-08-10: 25 ug/min via INTRAVENOUS

## 2016-08-10 MED ORDER — PROPOFOL 10 MG/ML IV BOLUS
INTRAVENOUS | Status: DC | PRN
  Administered 2016-08-10: 80 mg via INTRAVENOUS

## 2016-08-10 MED ORDER — ROCURONIUM BROMIDE 10 MG/ML (PF) SYRINGE
PREFILLED_SYRINGE | INTRAVENOUS | Status: DC | PRN
  Administered 2016-08-10: 40 mg via INTRAVENOUS
  Administered 2016-08-10: 30 mg via INTRAVENOUS

## 2016-08-10 MED ORDER — MIDAZOLAM HCL 2 MG/2ML IJ SOLN
INTRAMUSCULAR | Status: DC | PRN
  Administered 2016-08-10: 2 mg via INTRAVENOUS

## 2016-08-10 MED ORDER — 0.9 % SODIUM CHLORIDE (POUR BTL) OPTIME
TOPICAL | Status: DC | PRN
  Administered 2016-08-10: 1000 mL

## 2016-08-10 MED ORDER — LIDOCAINE 2% (20 MG/ML) 5 ML SYRINGE
INTRAMUSCULAR | Status: AC
  Filled 2016-08-10: qty 5

## 2016-08-10 MED ORDER — ONDANSETRON HCL 4 MG/2ML IJ SOLN
INTRAMUSCULAR | Status: AC
  Filled 2016-08-10: qty 2

## 2016-08-10 MED ORDER — PROPOFOL 10 MG/ML IV BOLUS
INTRAVENOUS | Status: AC
  Filled 2016-08-10: qty 20

## 2016-08-10 MED ORDER — PHENYLEPHRINE 40 MCG/ML (10ML) SYRINGE FOR IV PUSH (FOR BLOOD PRESSURE SUPPORT)
PREFILLED_SYRINGE | INTRAVENOUS | Status: DC | PRN
  Administered 2016-08-10: 120 ug via INTRAVENOUS

## 2016-08-10 MED ORDER — FENTANYL CITRATE (PF) 100 MCG/2ML IJ SOLN
INTRAMUSCULAR | Status: DC | PRN
  Administered 2016-08-10: 50 ug via INTRAVENOUS

## 2016-08-10 SURGICAL SUPPLY — 40 items
ADAPTER BRONCH F/PENTAX (ADAPTER) ×3 IMPLANT
BRUSH CYTOL CELLEBRITY 1.5X140 (MISCELLANEOUS) ×3 IMPLANT
BRUSH SUPERTRAX BIOPSY (INSTRUMENTS) IMPLANT
BRUSH SUPERTRAX NDL-TIP CYTO (INSTRUMENTS) ×3 IMPLANT
CANISTER SUCT 3000ML PPV (MISCELLANEOUS) ×3 IMPLANT
CHANNEL WORK EXTEND EDGE 180 (KITS) IMPLANT
CHANNEL WORK EXTEND EDGE 45 (KITS) IMPLANT
CHANNEL WORK EXTEND EDGE 90 (KITS) IMPLANT
CONT SPEC 4OZ CLIKSEAL STRL BL (MISCELLANEOUS) ×9 IMPLANT
COVER BACK TABLE 60X90IN (DRAPES) ×3 IMPLANT
FILTER STRAW FLUID ASPIR (MISCELLANEOUS) IMPLANT
FORCEPS BIOP SUPERTRX PREMAR (INSTRUMENTS) ×3 IMPLANT
FORCEPS RADIAL JAW LRG 4 PULM (INSTRUMENTS) ×2 IMPLANT
GAUZE SPONGE 4X4 12PLY STRL (GAUZE/BANDAGES/DRESSINGS) ×3 IMPLANT
GLOVE BIO SURGEON STRL SZ7.5 (GLOVE) ×6 IMPLANT
GOWN STRL REUS W/ TWL LRG LVL3 (GOWN DISPOSABLE) ×4 IMPLANT
GOWN STRL REUS W/TWL LRG LVL3 (GOWN DISPOSABLE) ×2
KIT CLEAN ENDO COMPLIANCE (KITS) ×3 IMPLANT
KIT LOCATABLE GUIDE (CANNULA) IMPLANT
KIT MARKER FIDUCIAL DELIVERY (KITS) ×3 IMPLANT
KIT PROCEDURE EDGE 180 (KITS) ×3 IMPLANT
KIT PROCEDURE EDGE 45 (KITS) IMPLANT
KIT PROCEDURE EDGE 90 (KITS) IMPLANT
KIT ROOM TURNOVER OR (KITS) ×3 IMPLANT
MARKER FIDUCIAL SL NIT COIL (Implant Marker) ×24 IMPLANT
MARKER SKIN DUAL TIP RULER LAB (MISCELLANEOUS) ×3 IMPLANT
NEEDLE SUPERTRX PREMARK BIOPSY (NEEDLE) ×9 IMPLANT
NS IRRIG 1000ML POUR BTL (IV SOLUTION) ×3 IMPLANT
OIL SILICONE PENTAX (PARTS (SERVICE/REPAIRS)) ×3 IMPLANT
PAD ARMBOARD 7.5X6 YLW CONV (MISCELLANEOUS) ×6 IMPLANT
PATCHES PATIENT (LABEL) ×3 IMPLANT
RADIAL JAW LRG 4 PULMONARY (INSTRUMENTS) ×1
SYR 20CC LL (SYRINGE) ×3 IMPLANT
SYR 20ML ECCENTRIC (SYRINGE) ×3 IMPLANT
SYR 50ML SLIP (SYRINGE) ×3 IMPLANT
TOWEL OR 17X24 6PK STRL BLUE (TOWEL DISPOSABLE) ×3 IMPLANT
TRAP SPECIMEN MUCOUS 40CC (MISCELLANEOUS) IMPLANT
TUBE CONNECTING 20X1/4 (TUBING) ×3 IMPLANT
UNDERPAD 30X30 (UNDERPADS AND DIAPERS) ×3 IMPLANT
WATER STERILE IRR 1000ML POUR (IV SOLUTION) ×3 IMPLANT

## 2016-08-10 NOTE — Op Note (Signed)
Video Bronchoscopy with Electromagnetic Navigation Procedure Note  Date of Operation: 08/10/2016  Pre-op Diagnosis: B pulmonary nodules  Post-op Diagnosis: same  Surgeon: Adrian [name]  Assistants: none  Anesthesia: General endotracheal anesthesia  Operation: Flexible video fiberoptic bronchoscopy with electromagnetic navigation and biopsies.  Estimated Blood Loss: Minimal  Complications: none apparent  Indications and History: Adrian [name] is a 66 y.o. male with hx laryngeal carcinoma, tobacco use, noted to have bilateral pulmonary nodules on CT chest 07/27/16. Recommendation was made to achieve tissue diagnosis via navigational bronchoscopy with biopsies.  The risks, benefits, complications, treatment options and expected outcomes were discussed with the patient.  The possibilities of pneumothorax, pneumonia, reaction to medication, pulmonary aspiration, perforation of a viscus, bleeding, failure to diagnose a condition and creating a complication requiring transfusion or operation were discussed with the patient who freely signed the consent.    Description of Procedure: The patient was seen in the Preoperative Area, was examined and was deemed appropriate to proceed.  The patient was taken to OR 12, identified as DRAKKAR [name] and the procedure verified as Flexible Video Fiberoptic Bronchoscopy.  A Time Out was held and the above information confirmed.   Prior to the date of the procedure a high-resolution CT scan of the chest was performed. Utilizing [place] a virtual tracheobronchial tree was generated to allow the creation of distinct navigation pathways to the patient's parenchymal abnormalities. After being taken to the operating room general anesthesia was initiated and the patient  was orally intubated. The video fiberoptic bronchoscope was introduced via the endotracheal tube and a general inspection was performed which showed some airway ectasia. There was a  distinct area of raised pale mucosa in the apical segment of the right upper lobe that was brushed and biopsied.  No other significant lesions were seen. There were no abnormal secretions. The extendable working channel and locator guide were introduced into the bronchoscope. The distinct navigation pathways prepared prior to this procedure were then utilized to navigate to within 0.5 - 1.2 cm of patient's lesion(s) identified on CT scan.   The targets were defined as follows: Left upper lobe nodule, target 1 Lingular nodule, target 2 Proximal right lower lobe nodule, target 3 More distal right lower lobe, nodule  The extendable working channel was secured into place and the locator guide was withdrawn. Under fluoroscopic guidance transbronchial needle brushings, transbronchial Wang needle biopsies, and transbronchial forceps biopsies were performed in the LUL (target 1) and the lingula (target 2) to be sent for cytology and pathology. A bronchioalveolar lavage was performed in the lingula in the vicinity of target 2  and sent for cytology and microbiology (bacterial, fungal, AFB smears and cultures).  Fiducial markers were then placed around target 1, target 2, and in the right lower lobe in the vicinity of both target 3 and target 4 to be used for possible radiation therapy should this be indicated. At the end of the procedure a general airway inspection was performed and there was no evidence of active bleeding. The bronchoscope was removed.  The patient tolerated the procedure well. There was no significant blood loss and there were no obvious complications. A post-procedural chest x-ray is pending.  Samples: 1. Transbronchial needle brushings from targets 1 and 2 2. Transbronchial Wang needle biopsies from targets 1 and 2 3. Transbronchial forceps biopsies from targets 1 and 2 4. Bronchoalveolar lavage from lingula, target 2 5. Endobronchial biopsies from RUL apical segment 6. Endobronchial  brushings from right  upper lobe apical segment  Plans:  The patient will be discharged from the PACU to home when recovered from anesthesia and after chest x-ray is reviewed. We will review the cytology, pathology and microbiology results with the patient when they become available. Outpatient followup will be with Adrian Lamonte Sakai and Adrian Alvy Bimler.   Adrian Apo, MD, PhD 08/10/2016, 12:58 PM [facility] [phone] or if no answer [phone]

## 2016-08-10 NOTE — Transfer of Care (Signed)
Immediate Anesthesia Transfer of Care Note  Patient: Gar[facility]  Procedure(s) Performed: Procedure(s): VIDEO BRONCHOSCOPY WITH ENDOBRONCHIAL NAVIGATION (N/A) MINOR PLACEMENT OF FIDUCIAL x3  Patient Location: PACU  Anesthesia Type:General  Level of Consciousness: awake, alert  and patient cooperative  Airway & Oxygen Therapy: Patient Spontanous Breathing and Patient connected to face mask oxygen  Post-op Assessment: Report given to RN, Post -op Vital signs reviewed and stable and Patient moving all extremities X 4  Post vital signs: Reviewed and stable  Last Vitals:  Vitals:   08/10/16 0858 08/10/16 1257  BP: (!) 152/76   Pulse: 67 69  Resp: 20 18  Temp: 36.6 C     Last Pain:  Vitals:   08/10/16 0858  TempSrc: Oral         Complications: No apparent anesthesia complications

## 2016-08-10 NOTE — Interval H&P Note (Signed)
PCCM Interval Note  Adrian [name] presents today for further eval of his bilateral pulmonary nodules. He reports no new issues or findings since our office visit. Explained the procedure, risks, benefits, rationale. All questions answered. Patient understands the procedure and consents to proceed.   Written consent obtained and signed. No barriers identified.   Baltazar Apo, MD, PhD 08/10/2016, 10:53 AM [facility] [phone] or if no answer [phone]

## 2016-08-10 NOTE — Anesthesia Procedure Notes (Signed)
Procedure Name: Intubation Date/Time: 08/10/2016 11:01 AM Performed by: Mervyn Gay Pre-anesthesia Checklist: Patient identified, Patient being monitored, Timeout performed, Emergency Drugs available and Suction available Patient Re-evaluated:Patient Re-evaluated prior to inductionOxygen Delivery Method: Circle System Utilized Preoxygenation: Pre-oxygenation with 100% oxygen Intubation Type: IV induction Ventilation: Mask ventilation without difficulty Laryngoscope Size: 3 and Mac Grade View: Grade I Tube type: Oral Tube size (mm): 10.0. Number of attempts: 1 Placement Confirmation: ETT inserted through vocal cords under direct vision,  positive ETCO2 and breath sounds checked- equal and bilateral Secured at: 22 cm Tube secured with: Tape Dental Injury: Teeth and Oropharynx as per pre-operative assessment

## 2016-08-10 NOTE — Discharge Instructions (Signed)
Flexible Bronchoscopy, Care After These instructions give you information on caring for yourself after your procedure. Your doctor may also give you more specific instructions. Call your doctor if you have any problems or questions after your procedure. Follow these instructions at home:  Do not eat or drink anything for 2 hours after your procedure. If you try to eat or drink before the medicine wears off, food or drink could go into your lungs. You could also burn yourself.  After 2 hours have passed and when you can cough and gag normally, you may eat soft food and drink liquids slowly.  The day after the test, you may eat your normal diet.  You may do your normal activities.  Keep all doctor visits. Get help right away if:  You get more and more short of breath.  You get light-headed.  You feel like you are going to pass out (faint).  You have chest pain.  You have new problems that worry you.  You cough up more than a little blood.  You cough up more blood than before. This information is not intended to replace advice given to you by your health care provider. Make sure you discuss any questions you have with your health care provider. Document Released: 02/20/2009 Document Revised: 10/01/2015 Document Reviewed: 12/28/2012 Elsevier Interactive Patient Education  2017 [place] FOR ANY QUESTIONS OR CONCERNS. [phone].

## 2016-08-10 NOTE — Anesthesia Postprocedure Evaluation (Addendum)
Anesthesia Post Note  Patient: Adrian [name] [facility]  Procedure(s) Performed: Procedure(s) (LRB): VIDEO BRONCHOSCOPY WITH ENDOBRONCHIAL NAVIGATION (N/A) MINOR PLACEMENT OF FIDUCIAL x3  Patient location during evaluation: PACU Anesthesia Type: General Level of consciousness: awake and alert Pain management: pain level controlled Vital Signs Assessment: post-procedure vital signs reviewed and stable Respiratory status: spontaneous breathing, nonlabored ventilation, respiratory function stable and patient connected to nasal cannula oxygen Cardiovascular status: blood pressure returned to baseline and stable Postop Assessment: no signs of nausea or vomiting Anesthetic complications: no       Last Vitals:  Vitals:   08/10/16 1330 08/10/16 1345  BP: 135/70   Pulse: 70   Resp: 17   Temp:  36.2 C    Last Pain:  Vitals:   08/10/16 0858  TempSrc: Oral                 Yoseline Andersson,JAMES TERRILL

## 2016-08-10 NOTE — Telephone Encounter (Signed)
Called patient to inform him of next scheduled appointments. Patient aware of appointments.

## 2016-08-10 NOTE — H&P (View-Only) (Signed)
Subjective:    Patient ID: Adrian [name], male    DOB: September 05, 1950, 66 y.o.   MRN: [id number]  HPI 66 year old active smoker with a history of laryngeal CA, followed by Dr Alvy Bimler. He has a CT chest and then a PET that show B pulmonary nodules in the left upper lobe and the RLL that are hypermetabolic, superimposed on severe emphysema. He was referred for needle bx, but this was deferred due to PTX risk. He presents now for consideration of ENB. Currently on Symbicort. Pred '5mg'$  daily > he isn't sure why. Smokes 2 cigarettes a day. No cough, no hemoptysis. Occasional DOE./    Review of Systems  Constitutional: Positive for appetite change and unexpected weight change. Negative for fever.  HENT: Negative for congestion, dental problem, ear pain, nosebleeds, postnasal drip, rhinorrhea, sinus pressure, sneezing, sore throat and trouble swallowing.   Eyes: Negative for redness and itching.  Respiratory: Positive for cough and shortness of breath. Negative for chest tightness and wheezing.   Cardiovascular: Negative for palpitations and leg swelling.  Gastrointestinal: Negative for nausea and vomiting.  Genitourinary: Negative for dysuria.  Musculoskeletal: Negative for joint swelling.  Skin: Negative for rash.  Neurological: Negative for headaches.  Hematological: Does not bruise/bleed easily.  Psychiatric/Behavioral: Negative for dysphoric mood. The patient is nervous/anxious.    Past Medical History:  Diagnosis Date  . Anxiety   . Arthritis    rheumatoid  . Cancer ([place]) 03/11/2013   larnyx/epiglottic Squamous cell in situ  . Emphysema of lung ([place])   . Esophageal reflux   . History of blood transfusion   . History of radiation therapy 05/13/2013-06/28/2013   68 gray to epiglottis/neck  . Megaloblastic anemia   . Pneumonia 01/2013  . Seizures ([place])    last one was 8 years ago  . Shortness of breath    with exertion  . Throat pain 06/13/2013     Family History  Problem Relation  Age of Onset  . Hypertension Mother   . Arthritis/Rheumatoid Mother   . Stroke Mother      Social History   Social History  . Marital status: Married    Spouse name: Hassan Rowan  . Number of children: 3  . Years of education: 12th   Occupational History  . Retired Other   Social History Main Topics  . Smoking status: Current Some Day Smoker    Packs/day: 1.00    Years: 40.00    Types: Cigarettes  . Smokeless tobacco: Never Used     Comment: smoke 1 to 2 a day  . Alcohol use No  . Drug use: No  . Sexual activity: Not on file   Other Topics Concern  . Not on file   Social History Narrative   04/12/2013   The patient is married with 3 children. (2 girls and one boy)    The patient has a history of smoking a quarter pack per day for 15 years.   The patient is trying to quit on his own.   Patient has not had any alcohol for the past 6 months by report. Patient usually drinks vodka.   Caffeine Use: 2 cups daily              No Known Allergies   Outpatient Medications Prior to Visit  Medication Sig Dispense Refill  . budesonide-formoterol (SYMBICORT) 80-4.5 MCG/ACT inhaler Inhale 2 puffs into the lungs 2 (two) times daily as needed (Short of breath).    . cyanocobalamin  500 MCG tablet Take 500 mcg by mouth daily.    . diazepam (VALIUM) 5 MG tablet Take 10 mg by mouth every 6 (six) hours as needed for anxiety.     . ferrous sulfate 325 (65 FE) MG tablet Take 325 mg by mouth daily with breakfast.    . folic acid (FOLVITE) 1 MG tablet Take 1 mg by mouth daily.    Marland Kitchen levETIRAcetam (KEPPRA) 500 MG tablet Take 1 tablet (500 mg total) by mouth 2 (two) times daily. 60 tablet 11  . mometasone (NASONEX) 50 MCG/ACT nasal spray Place 2 sprays into both nostrils daily.    . predniSONE (DELTASONE) 5 MG tablet Take 1 tablet (5 mg total) by mouth daily with breakfast. 30 tablet 1  . vitamin E 1000 UNIT capsule Take 1,000 Units by mouth daily.      No facility-administered medications prior  to visit.    CBC    Component Value Date/Time   WBC 3.5 (L) 07/27/2016 0931   RBC 2.35 (L) 07/27/2016 0931   HGB 8.7 (L) 07/27/2016 0931   HGB 9.0 (L) 07/26/2016 1105   HCT 27.4 (L) 07/27/2016 0931   HCT 27.6 (L) 07/26/2016 1105   PLT 237 07/27/2016 0931   PLT 254 07/26/2016 1105   MCV 116.6 (H) 07/27/2016 0931   MCV 117.6 (H) 07/26/2016 1105   MCH 37.0 (H) 07/27/2016 0931   MCHC 31.8 07/27/2016 0931   RDW 17.2 (H) 07/27/2016 0931   RDW 18.4 (H) 07/26/2016 1105   LYMPHSABS 0.8 07/27/2016 0931   LYMPHSABS 0.6 (L) 07/26/2016 1105   MONOABS 0.4 07/27/2016 0931   MONOABS 0.4 07/26/2016 1105   EOSABS 0.1 07/27/2016 0931   EOSABS 0.1 07/26/2016 1105   BASOSABS 0.0 07/27/2016 0931   BASOSABS 0.0 07/26/2016 1105        Objective:   Physical Exam Vitals:   08/04/16 1051  BP: 110/68  Pulse: 82  SpO2: 96%  Weight: 114 lb 6.4 oz (51.9 kg)  Height: 6' (1.829 m)   Gen: Pleasant, thin man, in NAD, smells of cigarettes  ENT: no dysarthria, Op clear  Neck: No JVD, no stridor  Lungs: No use of accessory muscles, distant but clear, no wheeze  Cardiovascular: RRR, no M  Musculoskeletal: No deformities, no cyanosis or clubbing  Neuro: alert, non focal  Skin: Warm, no lesions or rashes   CT chest 08/06/16 --  EXAM: CT CHEST WITHOUT CONTRAST  TECHNIQUE: Multidetector CT imaging of the chest was performed following the standard protocol without IV contrast.  COMPARISON:  PET-CT 07/11/2016.  Chest CT 08-May-202018  FINDINGS: Cardiovascular: Coronary arteries demonstrate atherosclerotic calcifications. Heart size is within normal limits without significant pericardial fluid. No significant enlargement of the thoracic aorta.  Mediastinum/Nodes: No significant chest lymphadenopathy but limited evaluation on this noncontrast examination. No significant axillary lymphadenopathy.  Lungs/Pleura: There is diffuse centrilobular emphysema. Probable focal scarring in the  posterior right apex. Again noted are scattered pulmonary lesions concerning for metastatic neoplasms. The spiculated lesion in the lateral left upper lobe measures up to 2.5 cm and minimally changed. Again noted is a large irregular lesion in the lingula with a solitary calcification. This lesion measures roughly 3.6 cm. There is volume loss in left lower lobe. Again noted is a cavitary lesion along the periphery of the right lower lobe measuring roughly 1.7 cm and previously measured 1.5 cm. Again noted is a spiculated lesion in the superior segment of the right lower lobe measuring up to 2.1  cm and minimally changed.  Upper Abdomen: Again noted is a cystic structure in left kidney upper pole measuring 2.3 cm. No acute abnormality in the upper abdomen.  Musculoskeletal: No acute bone abnormality.  IMPRESSION: Bilateral pulmonary lesions. These are suspicious for metastatic disease. Lesions have minimally changed but there may be slight enlargement of the cavitary lesion in the right lower lobe.  Severe emphysematous changes.       Assessment & Plan:  Metastasis to lung of unknown origin, left (HCC) Bilateral pulmonary nodules. Unclear whether these are primary lung cancer versus metastatic disease from his head and neck cancer. Agree that there is significant risk for PTX. We should be able to perform ENB to approach both sides. He agrees after discussing the risks. Will perform spirometry to assess his pre-op risk. Will work on scheduling ENB, hopefully for next week.   COPD (chronic obstructive pulmonary disease) Continue Symbicort Spirometry today to assess AFL, pre-op eval.   Baltazar Apo, MD, PhD 08/04/2016, 11:24 AM [facility] [phone] or if no answer [phone]

## 2016-08-11 ENCOUNTER — Encounter (HOSPITAL_COMMUNITY): Payer: Self-pay | Admitting: Emergency Medicine

## 2016-08-11 LAB — ACID FAST SMEAR (AFB, MYCOBACTERIA): Acid Fast Smear: NEGATIVE

## 2016-08-11 LAB — ACID FAST SMEAR (AFB)

## 2016-08-12 ENCOUNTER — Telehealth: Payer: Self-pay | Admitting: *Deleted

## 2016-08-12 LAB — CULTURE, RESPIRATORY: CULTURE: NO GROWTH

## 2016-08-12 LAB — CULTURE, RESPIRATORY W GRAM STAIN

## 2016-08-12 NOTE — Telephone Encounter (Signed)
Called [facility] pathology- requested testing below

## 2016-08-12 NOTE — Telephone Encounter (Signed)
-----   Message from [name], MD sent at 08/12/2016 12:07 PM EDT ----- Regarding: additional test Pls call Dr. Lyndon Code On the surgical pathology report, part 3 showed adenocarcinoma. Can he add Foundation One test and PD-1 testing on that sample? ----- Message ----- From: Interface, Lab In [facility] Sent: 08/11/2016   1:59 PM To: [name], MD

## 2016-08-14 LAB — BPAM RBC
BLOOD PRODUCT EXPIRATION DATE: 201804122359
BLOOD PRODUCT EXPIRATION DATE: 201804232359
Unit Type and Rh: 5100
Unit Type and Rh: 5100

## 2016-08-14 LAB — TYPE AND SCREEN
ABO/RH(D): O POS
ANTIBODY SCREEN: NEGATIVE
Donor AG Type: NEGATIVE
Donor AG Type: NEGATIVE
PT AG TYPE: NEGATIVE
Unit division: 0
Unit division: 0

## 2016-08-15 ENCOUNTER — Other Ambulatory Visit (HOSPITAL_COMMUNITY)
Admission: RE | Admit: 2016-08-15 | Discharge: 2016-08-15 | Disposition: A | Discharge status: Home / Self Care | Payer: Medicare Other | Source: Ambulatory Visit | Attending: Hematology and Oncology | Admitting: Hematology and Oncology

## 2016-08-15 ENCOUNTER — Telehealth: Payer: Self-pay | Admitting: Emergency Medicine

## 2016-08-15 DIAGNOSIS — R918 Other nonspecific abnormal finding of lung field: Secondary | ICD-10-CM | POA: Diagnosis present

## 2016-08-15 NOTE — Telephone Encounter (Signed)
Attempted to cal patient and his wife to discuss results. Left messages on both machines that I would try to contact them again. Looks like there may be two tissue types - squamous cell and also adenoCA. Dr Alvy Bimler aware per review of the notes.

## 2016-08-16 NOTE — Telephone Encounter (Signed)
I discussed the biopsy results today with the patient and his wife by phone. Interestingly he has 2 different tissue diagnoses on the left upper lobe lesions from different regions. One is adenocarcinoma and the other more consistent with squamous cell carcinoma. The right sided nodules did not give a diagnosis but I suspect that these are malignant as well. I did place fiducial markers around each lesion in the event that he might be a candidate for radiation therapy. He has a appointment to see Dr Alvy Bimler tomorrow am to discuss options. They understand the results.

## 2016-08-16 NOTE — Telephone Encounter (Signed)
Thanks for the update

## 2016-08-17 ENCOUNTER — Other Ambulatory Visit (HOSPITAL_BASED_OUTPATIENT_CLINIC_OR_DEPARTMENT_OTHER): Payer: Medicare Other

## 2016-08-17 ENCOUNTER — Telehealth: Payer: Self-pay | Admitting: Hematology and Oncology

## 2016-08-17 ENCOUNTER — Ambulatory Visit (HOSPITAL_BASED_OUTPATIENT_CLINIC_OR_DEPARTMENT_OTHER): Payer: Medicare Other | Admitting: Hematology and Oncology

## 2016-08-17 ENCOUNTER — Encounter: Payer: Self-pay | Admitting: Hematology and Oncology

## 2016-08-17 VITALS — BP 143/60 | HR 72 | Temp 97.7°F | Resp 18 | Ht 72.0 in | Wt 112.6 lb

## 2016-08-17 DIAGNOSIS — C801 Malignant (primary) neoplasm, unspecified: Principal | ICD-10-CM

## 2016-08-17 DIAGNOSIS — C3412 Malignant neoplasm of upper lobe, left bronchus or lung: Secondary | ICD-10-CM

## 2016-08-17 DIAGNOSIS — D4620 Refractory anemia with excess of blasts, unspecified: Secondary | ICD-10-CM

## 2016-08-17 DIAGNOSIS — C7802 Secondary malignant neoplasm of left lung: Secondary | ICD-10-CM

## 2016-08-17 LAB — CBC WITH DIFFERENTIAL/PLATELET
BASO%: 1.3 % (ref 0.0–2.0)
BASOS ABS: 0 10*3/uL (ref 0.0–0.1)
EOS%: 1.3 % (ref 0.0–7.0)
Eosinophils Absolute: 0 10*3/uL (ref 0.0–0.5)
HEMATOCRIT: 28.1 % — AB (ref 38.4–49.9)
HEMOGLOBIN: 9.3 g/dL — AB (ref 13.0–17.1)
LYMPH#: 0.8 10*3/uL — AB (ref 0.9–3.3)
LYMPH%: 24.1 % (ref 14.0–49.0)
MCH: 38.8 pg — ABNORMAL HIGH (ref 27.2–33.4)
MCHC: 33 g/dL (ref 32.0–36.0)
MCV: 117.4 fL — ABNORMAL HIGH (ref 79.3–98.0)
MONO#: 0.4 10*3/uL (ref 0.1–0.9)
MONO%: 11.5 % (ref 0.0–14.0)
NEUT#: 2.2 10*3/uL (ref 1.5–6.5)
NEUT%: 61.8 % (ref 39.0–75.0)
PLATELETS: 301 10*3/uL (ref 140–400)
RBC: 2.39 10*6/uL — ABNORMAL LOW (ref 4.20–5.82)
RDW: 17.4 % — AB (ref 11.0–14.6)
WBC: 3.5 10*3/uL — ABNORMAL LOW (ref 4.0–10.3)

## 2016-08-17 MED ORDER — DARBEPOETIN ALFA 500 MCG/ML IJ SOSY
500.0000 ug | PREFILLED_SYRINGE | Freq: Once | INTRAMUSCULAR | Status: AC
  Administered 2016-08-17: 500 ug via SUBCUTANEOUS
  Filled 2016-08-17: qty 1

## 2016-08-17 NOTE — Progress Notes (Signed)
[place] OFFICE PROGRESS NOTE  Patient Care Team: [name], MD as PCP - General (Family Medicine) [name], MD as Consulting Physician (Hematology and Oncology)  SUMMARY OF ONCOLOGIC HISTORY: Oncology History   MDS, R-IPSS score of 3, low risk (Hg 8.9, +16 chromosome on BM, 2% blast count)   Squamous cell carcinoma of the epiglottis   Primary site: Larynx - Supraglottis (Left)   Staging method: AJCC 7th Edition   Clinical: Stage I (T1, N0, M0) signed by [name], MD on 04/30/2013 12:49 PM   Pathologic: Stage I (T1, N0, cM0) signed by [name], MD on 04/30/2013 12:49 PM   Summary: Stage I (T1, N0, cM0)       MDS (myelodysplastic syndrome), low grade ([place])   02/01/2007 Bone Marrow Biopsy    BM biopsy was abnormal, overall probable low grade MDS      03/23/2011 - 02/06/2013 Chemotherapy    [name], discontinued due to diagnosis of laryngeal ca      09/06/2013 Bone Marrow Biopsy    Repeat bone marrow aspirate and biopsy confirmed low-grade myelodysplastic syndrome.      09/17/2013 -  Chemotherapy    The patient resumed darbepoetin injections to treat the anemia.       History of head and neck cancer   03/06/2013 Procedure    Biopsy from epiglottic region showed invasive [facility]      04/25/2013 Imaging    Ct scan showed no other involvement in the LN      05/13/2013 - 06/28/2013 Radiation Therapy    He received radiation therapy, 70 gray in 35 fractions to the larynx      06/09/2016 Imaging    CT scan of the chest showed Bilateral spiculated soft tissue pulmonary masses, highly suspicious for multifocal malignancy. Borderline enlarged left-sided mediastinal lymphadenopathy. Background of severe emphysematous changes, chronic cylindrical bronchiectasis and hyperinflation of the lungs. Findings consistent with longstanding COPD. Diffusely thickened interstitial markings. This may represent chronic interstitial lung changes, however  lymphangitic spread of malignancy cannot be excluded. Anterior compression deformities of several of the thoracic vertebral bodies, with heterogeneous appearance of T7 and T9 vertebral body. This may represent degenerative changes, however osseous metastatic disease cannot be excluded.       07/11/2016 PET scan    There are 2 nodules in the left upper lobe and 2 nodules within the right lower lobe which have spiculated margin and exhibit intense radiotracer uptake compatible with multifocal pulmonary metastasis versus multiple synchronous primary pulmonary neoplasms. 2. Emphysema 3. Aortic atherosclerosis and multi vessel coronary artery calcification.       Cancer of upper lobe of left lung ([place])   06/09/2016 Imaging    CT chest: Bilateral spiculated soft tissue pulmonary masses, highly suspicious for multifocal malignancy. Borderline enlarged left-sided mediastinal lymphadenopathy. Background of severe emphysematous changes, chronic cylindrical bronchiectasis and hyperinflation of the lungs. Findings consistent with longstanding COPD. Diffusely thickened interstitial markings. This may represent chronic interstitial lung changes, however lymphangitic spread of malignancy cannot be excluded. Anterior compression deformities of several of the thoracic vertebral bodies, with heterogeneous appearance of T7 and T9 vertebral body. This may represent degenerative changes, however osseous metastatic disease cannot be excluded.       06/29/2016 Imaging    MRI brain: No evidence of metastatic disease or recent infarction. Advanced generalized brain atrophy with moderate to marked chronic small-vessel ischemic changes throughout. FLAIR imaging appears to show scattered gyri showing FLAIR hyperintensity. These abnormalities are not  confirmed with restricted diffusion or contrast enhancement. Therefore, we are not certain that this is not artifactual. The differential diagnosis does include true pathology such  as posterior reversible encephalopathy, viral or prion disease, postictal change in post chemotherapy change. If there is concern about active CNS pathology, re- scanning in 4-6 weeks could be useful.      07/27/2016 Imaging    CT chest: Bilateral pulmonary lesions. These are suspicious for metastatic disease. Lesions have minimally changed but there may be slight enlargement of the cavitary lesion in the right lower lobe. Severe emphysematous changes.       08/10/2016 Pathology Results    1. Lung, biopsy, RUL, (apical segment) - SCANT BENIGN LUNG PARENCHYMA. - THERE IS NO EVIDENCE OF MALIGNANCY. 2. Lung, biopsy, LUL lingula - SQUAMOUS CELL CARCINOMA. - SEE COMMENT. 3. Lung, biopsy, LUL - ADENOCARCINOMA. - SEE COMMENT. Microscopic Comment 2. The malignant cells are positive for cytokeratin 56 and p63. They are negative for TTF-1. The findings are consistent with squamous cell carcinoma. There is likely insufficient tissue remaining for additional studies, if requested. 3. The malignant cells are positive for TTF-1 and negative for p63 and cytokeratin 5/6. The findings are consistent with adenocarcinoma.      08/10/2016 Procedure    The targets were defined as follows: Left upper lobe nodule, target 1 Lingular nodule, target 2 Proximal right lower lobe nodule, target 3 More distal right lower lobe, nodule  The extendable working channel was secured into place and the locator guide was withdrawn. Under fluoroscopic guidance transbronchial needle brushings, transbronchial Wang needle biopsies, and transbronchial forceps biopsies were performed in the LUL (target 1) and the lingula (target 2) to be sent for cytology and pathology. A bronchioalveolar lavage was performed in the lingula in the vicinity of target 2  and sent for cytology and microbiology (bacterial, fungal, AFB smears and cultures).  Fiducial markers were then placed around target 1, target 2, and in the right lower lobe in the  vicinity of both target 3 and target 4 to be used for possible radiation therapy should this be indicated. At the end of the procedure a general airway inspection was performed and there was no evidence of active bleeding. The bronchoscope was removed.  The patient tolerated the procedure well. There was no significant blood loss and there were no obvious complications. A post-procedural chest x-ray is pending.  Samples: 1. Transbronchial needle brushings from targets 1 and 2 2. Transbronchial Wang needle biopsies from targets 1 and 2 3. Transbronchial forceps biopsies from targets 1 and 2 4. Bronchoalveolar lavage from lingula, target 2 5. Endobronchial biopsies from RUL apical segment 6. Endobronchial brushings from right upper lobe apical segment        INTERVAL HISTORY: Please see below for problem oriented charting. He returns with his daughter today to discuss test results He tolerated recent bronchoscopy and biopsy well. No recent hemoptysis.  REVIEW OF SYSTEMS:   Constitutional: Denies fevers, chills or abnormal weight loss Eyes: Denies blurriness of vision Ears, nose, mouth, throat, and face: Denies mucositis or sore throat Respiratory: Denies cough, dyspnea or wheezes Cardiovascular: Denies palpitation, chest discomfort or lower extremity swelling Gastrointestinal:  Denies nausea, heartburn or change in bowel habits Skin: Denies abnormal skin rashes Lymphatics: Denies new lymphadenopathy or easy bruising Neurological:Denies numbness, tingling or new weaknesses Behavioral/Psych: Mood is stable, no new changes  All other systems were reviewed with the patient and are negative.  I have reviewed the past medical history, past  surgical history, social history and family history with the patient and they are unchanged from previous note.  ALLERGIES:  is allergic to no known allergies.  MEDICATIONS:  Current Outpatient Prescriptions  Medication Sig Dispense Refill  .  budesonide-formoterol (SYMBICORT) 80-4.5 MCG/ACT inhaler Inhale 2 puffs into the lungs 2 (two) times daily as needed (Short of breath).    . cyanocobalamin 500 MCG tablet Take 500 mcg by mouth daily.    . diazepam (VALIUM) 5 MG tablet Take 5 mg by mouth every 6 (six) hours as needed for anxiety.     . ferrous sulfate 325 (65 FE) MG tablet Take 325 mg by mouth daily with breakfast.    . folic acid (FOLVITE) 1 MG tablet Take 1 mg by mouth daily.    Marland Kitchen levETIRAcetam (KEPPRA) 500 MG tablet Take 1 tablet (500 mg total) by mouth 2 (two) times daily. 60 tablet 11  . mometasone (NASONEX) 50 MCG/ACT nasal spray Place 2 sprays into both nostrils daily.    . vitamin E 1000 UNIT capsule Take 1,000 Units by mouth daily.     . predniSONE (DELTASONE) 5 MG tablet Take 1 tablet (5 mg total) by mouth daily with breakfast. (Patient not taking: Reported on 08/17/2016) 30 tablet 1   No current facility-administered medications for this visit.     PHYSICAL EXAMINATION: ECOG PERFORMANCE STATUS: 1 - Symptomatic but completely ambulatory  Vitals:   08/17/16 0847  BP: (!) 143/60  Pulse: 72  Resp: 18  Temp: 97.7 F (36.5 C)   Filed Weights   08/17/16 0847  Weight: 112 lb 9.6 oz (51.1 kg)    GENERAL:alert, no distress and comfortable SKIN: skin color, texture, turgor are normal, no rashes or significant lesions EYES: normal, Conjunctiva are pink and non-injected, sclera clear Musculoskeletal:no cyanosis of digits and no clubbing  NEURO: alert & oriented x 3 with fluent speech, no focal motor/sensory deficits  LABORATORY DATA:  I have reviewed the data as listed    Component Value Date/Time   NA 137 07/27/2016 0931   NA 139 09/17/2013 1158   K 4.1 07/27/2016 0931   K 4.1 09/17/2013 1158   CL 107 07/27/2016 0931   CL 107 09/19/2012 1325   CO2 23 07/27/2016 0931   CO2 20 (L) 09/17/2013 1158   GLUCOSE 84 07/27/2016 0931   GLUCOSE 77 09/17/2013 1158   GLUCOSE 97 09/19/2012 1325   BUN 15 07/27/2016  0931   BUN 17.4 09/17/2013 1158   CREATININE 0.85 07/27/2016 0931   CREATININE 0.9 09/17/2013 1158   CALCIUM 8.9 07/27/2016 0931   CALCIUM 9.3 09/17/2013 1158   PROT 8.5 (H) 06/21/2014 0521   PROT 8.5 (H) 09/17/2013 1158   ALBUMIN 3.4 (L) 06/21/2014 0521   ALBUMIN 3.5 09/17/2013 1158   AST 15 06/21/2014 0521   AST 10 09/17/2013 1158   ALT 8 06/21/2014 0521   ALT <6 09/17/2013 1158   ALKPHOS 129 (H) 06/21/2014 0521   ALKPHOS 151 (H) 09/17/2013 1158   BILITOT 0.3 06/21/2014 0521   BILITOT 0.23 09/17/2013 1158   GFRNONAA >60 07/27/2016 0931   GFRAA >60 07/27/2016 0931    No results found for: SPEP, UPEP  Lab Results  Component Value Date   WBC 3.5 (L) 08/17/2016   NEUTROABS 2.2 08/17/2016   HGB 9.3 (L) 08/17/2016   HCT 28.1 (L) 08/17/2016   MCV 117.4 (H) 08/17/2016   PLT 301 08/17/2016      Chemistry      Component  Value Date/Time   NA 137 07/27/2016 0931   NA 139 09/17/2013 1158   K 4.1 07/27/2016 0931   K 4.1 09/17/2013 1158   CL 107 07/27/2016 0931   CL 107 09/19/2012 1325   CO2 23 07/27/2016 0931   CO2 20 (L) 09/17/2013 1158   BUN 15 07/27/2016 0931   BUN 17.4 09/17/2013 1158   CREATININE 0.85 07/27/2016 0931   CREATININE 0.9 09/17/2013 1158      Component Value Date/Time   CALCIUM 8.9 07/27/2016 0931   CALCIUM 9.3 09/17/2013 1158   ALKPHOS 129 (H) 06/21/2014 0521   ALKPHOS 151 (H) 09/17/2013 1158   AST 15 06/21/2014 0521   AST 10 09/17/2013 1158   ALT 8 06/21/2014 0521   ALT <6 09/17/2013 1158   BILITOT 0.3 06/21/2014 0521   BILITOT 0.23 09/17/2013 1158       RADIOGRAPHIC STUDIES: I have personally reviewed the radiological images as listed and agreed with the findings in the report. Ct Chest Wo Contrast  Result Date: 07/27/2016 CLINICAL DATA:  66 year old with history of head and neck cancer and multiple lung nodules. Patient is being evaluated for a lung biopsy. Patient was initially considered for a percutaneous CT-guided biopsy. After further  review, the patient is being referred to pulmonology for bronchoscopic navigational biopsy. These images were obtained in anticipation for the bronchoscopic biopsy. EXAM: CT CHEST WITHOUT CONTRAST TECHNIQUE: Multidetector CT imaging of the chest was performed following the standard protocol without IV contrast. COMPARISON:  PET-CT 07/11/2016.  Chest CT 04/10/202018 FINDINGS: Cardiovascular: Coronary arteries demonstrate atherosclerotic calcifications. Heart size is within normal limits without significant pericardial fluid. No significant enlargement of the thoracic aorta. Mediastinum/Nodes: No significant chest lymphadenopathy but limited evaluation on this noncontrast examination. No significant axillary lymphadenopathy. Lungs/Pleura: There is diffuse centrilobular emphysema. Probable focal scarring in the posterior right apex. Again noted are scattered pulmonary lesions concerning for metastatic neoplasms. The spiculated lesion in the lateral left upper lobe measures up to 2.5 cm and minimally changed. Again noted is a large irregular lesion in the lingula with a solitary calcification. This lesion measures roughly 3.6 cm. There is volume loss in left lower lobe. Again noted is a cavitary lesion along the periphery of the right lower lobe measuring roughly 1.7 cm and previously measured 1.5 cm. Again noted is a spiculated lesion in the superior segment of the right lower lobe measuring up to 2.1 cm and minimally changed. Upper Abdomen: Again noted is a cystic structure in left kidney upper pole measuring 2.3 cm. No acute abnormality in the upper abdomen. Musculoskeletal: No acute bone abnormality. IMPRESSION: Bilateral pulmonary lesions. These are suspicious for metastatic disease. Lesions have minimally changed but there may be slight enlargement of the cavitary lesion in the right lower lobe. Severe emphysematous changes. Electronically Signed   By: Markus Daft M.D.   On: 07/27/2016 14:32   Dg Chest Port 1  View  Result Date: 08/10/2016 CLINICAL DATA:  S post bronchoscopy with bilateral biopsies. EXAM: PORTABLE CHEST 1 VIEW COMPARISON:  Chest x-ray of June 07, 2016 FINDINGS: The lungs are well-expanded. No post biopsy pneumothorax is observed. There is no pleural effusion. The interstitial markings are coarse patchy density in the upper lobes and left lower and right mid lung are present and stable. Multiple markers have been placed at the site of biopsies bilaterally. The heart and pulmonary vascularity are normal. The mediastinum is normal in width. There is calcification in the wall of the aortic arch. IMPRESSION:  No postprocedure complication following bronchoscopy with bilateral biopsies. Electronically Signed   By: David  Martinique M.D.   On: 08/10/2016 13:27   Dg C-arm Bronchoscopy  Result Date: 08/10/2016 C-ARM BRONCHOSCOPY: Fluoroscopy was utilized by the requesting physician.  No radiographic interpretation.    ASSESSMENT & PLAN:  Cancer of upper lobe of left lung ([place]) I reviewed the guidelines with the patient and his daughter. The patient has stage IV disease, treatment is noncurable due to bilateral lung involvement. The patient has stopped smoking. We discussed various different combination treatment versus single agent. I have ordered additional molecular studies, pending. We discussed common risks involved with chemotherapy including risk of pancytopenia, infection, need for blood transfusion, etc. The patient is undecided. He also appears to have very poor memory and recollection of discussion today Ultimately, he decided to go home and talk to his wife before make final decision We discussed port placement in anticipation for systemic chemotherapy and he agreed to proceed. I plan to see him back again in 3 weeks to review recommendation  MDS (myelodysplastic syndrome), low grade The anemia from MDS is stable. I suspect he may have some degree of bone marrow suppression from his  antiseizure medications. He had responded well to Darbopeitin in the past with improvement in energy level. The patient will return every 3 weeks to get blood work checked and he will get injection to keep hemoglobin greater than 10 g.   Orders Placed This Encounter  Procedures  . IR FLUORO GUIDE PORT INSERTION RIGHT    Standing Status:   Future    Standing Expiration Date:   10/17/2017    Order Specific Question:   Reason for Exam (SYMPTOM  OR DIAGNOSIS REQUIRED)    Answer:   need port for chemo    Order Specific Question:   Preferred Imaging Location?    Answer:   [place] TREATMENT CONDITION    Hold Aranesp: if hemoglobin is >10   All questions were answered. The patient knows to call the clinic with any problems, questions or concerns. No barriers to learning was detected. I spent 30 minutes counseling the patient face to face. The total time spent in the appointment was 40 minutes and more than 50% was on counseling and review of test results     [name], MD 08/17/2016 12:23 PM

## 2016-08-17 NOTE — Telephone Encounter (Signed)
Appointments scheduled per 08/17/16 los. Patient was given a copy of the AVS report and appointment schedule, per 08/17/16 los.

## 2016-08-17 NOTE — Assessment & Plan Note (Signed)
I reviewed the guidelines with the patient and his daughter. The patient has stage IV disease, treatment is noncurable due to bilateral lung involvement. The patient has stopped smoking. We discussed various different combination treatment versus single agent. I have ordered additional molecular studies, pending. We discussed common risks involved with chemotherapy including risk of pancytopenia, infection, need for blood transfusion, etc. The patient is undecided. He also appears to have very poor memory and recollection of discussion today Ultimately, he decided to go home and talk to his wife before make final decision We discussed port placement in anticipation for systemic chemotherapy and he agreed to proceed. I plan to see him back again in 3 weeks to review recommendation

## 2016-08-17 NOTE — Assessment & Plan Note (Signed)
The anemia from MDS is stable. I suspect he may have some degree of bone marrow suppression from his antiseizure medications. He had responded well to Darbopeitin in the past with improvement in energy level. The patient will return every 3 weeks to get blood work checked and he will get injection to keep hemoglobin greater than 10 g.

## 2016-08-23 MED ORDER — DIPHENHYDRAMINE HCL 50 MG/ML IJ SOLN
INTRAMUSCULAR | Status: AC
  Filled 2016-08-23: qty 1

## 2016-08-23 MED ORDER — PALONOSETRON HCL INJECTION 0.25 MG/5ML
INTRAVENOUS | Status: AC
  Filled 2016-08-23: qty 5

## 2016-08-23 MED ORDER — FAMOTIDINE IN NACL 20-0.9 MG/50ML-% IV SOLN
INTRAVENOUS | Status: AC
  Filled 2016-08-23: qty 50

## 2016-08-26 ENCOUNTER — Telehealth: Payer: Self-pay | Admitting: *Deleted

## 2016-08-26 ENCOUNTER — Encounter (HOSPITAL_COMMUNITY): Payer: Self-pay

## 2016-08-26 NOTE — Telephone Encounter (Signed)
Call from pt's wife: "I understood the May appointment was to discuss the doctor's recommendation and make a decision. Why have they already scheduled a port?" Per office note, plan for port placement was discussed with patient and daughter in anticipation for chemo. Wife would like a call back to discuss.

## 2016-08-26 NOTE — Telephone Encounter (Signed)
OK to proceed with port as scheduled

## 2016-08-26 NOTE — Telephone Encounter (Signed)
There is unlikely pill type option available for his lung cancer Rx If the wife does not want port placement before his return appt, we can cancel it and reschedule port until afterwards. It may delay onset of treatment, decision is up to her Adrian [name], please call her and ask her what she wants

## 2016-08-29 ENCOUNTER — Other Ambulatory Visit: Payer: Self-pay | Admitting: Radiology

## 2016-08-30 ENCOUNTER — Other Ambulatory Visit: Payer: Self-pay | Admitting: Radiology

## 2016-08-30 MED ORDER — PALONOSETRON HCL INJECTION 0.25 MG/5ML
INTRAVENOUS | Status: AC
  Filled 2016-08-30: qty 5

## 2016-08-30 MED ORDER — FAMOTIDINE IN NACL 20-0.9 MG/50ML-% IV SOLN
INTRAVENOUS | Status: AC
  Filled 2016-08-30: qty 50

## 2016-08-30 MED ORDER — DIPHENHYDRAMINE HCL 50 MG/ML IJ SOLN
INTRAMUSCULAR | Status: AC
  Filled 2016-08-30: qty 1

## 2016-08-31 ENCOUNTER — Other Ambulatory Visit: Payer: Self-pay | Admitting: Hematology and Oncology

## 2016-08-31 ENCOUNTER — Ambulatory Visit (HOSPITAL_COMMUNITY)
Admission: RE | Admit: 2016-08-31 | Discharge: 2016-08-31 | Disposition: A | Discharge status: Home / Self Care | Payer: Medicare Other | Source: Ambulatory Visit | Attending: Hematology and Oncology | Admitting: Hematology and Oncology

## 2016-08-31 ENCOUNTER — Encounter (HOSPITAL_COMMUNITY): Payer: Self-pay

## 2016-08-31 DIAGNOSIS — C801 Malignant (primary) neoplasm, unspecified: Principal | ICD-10-CM

## 2016-08-31 DIAGNOSIS — K219 Gastro-esophageal reflux disease without esophagitis: Secondary | ICD-10-CM | POA: Diagnosis not present

## 2016-08-31 DIAGNOSIS — Z8521 Personal history of malignant neoplasm of larynx: Secondary | ICD-10-CM | POA: Insufficient documentation

## 2016-08-31 DIAGNOSIS — Z7951 Long term (current) use of inhaled steroids: Secondary | ICD-10-CM | POA: Diagnosis not present

## 2016-08-31 DIAGNOSIS — Z87891 Personal history of nicotine dependence: Secondary | ICD-10-CM | POA: Diagnosis not present

## 2016-08-31 DIAGNOSIS — J439 Emphysema, unspecified: Secondary | ICD-10-CM | POA: Diagnosis not present

## 2016-08-31 DIAGNOSIS — C7802 Secondary malignant neoplasm of left lung: Secondary | ICD-10-CM

## 2016-08-31 DIAGNOSIS — M069 Rheumatoid arthritis, unspecified: Secondary | ICD-10-CM | POA: Diagnosis not present

## 2016-08-31 DIAGNOSIS — C3490 Malignant neoplasm of unspecified part of unspecified bronchus or lung: Secondary | ICD-10-CM | POA: Diagnosis not present

## 2016-08-31 DIAGNOSIS — Z7952 Long term (current) use of systemic steroids: Secondary | ICD-10-CM | POA: Diagnosis not present

## 2016-08-31 DIAGNOSIS — Z5111 Encounter for antineoplastic chemotherapy: Secondary | ICD-10-CM | POA: Diagnosis not present

## 2016-08-31 DIAGNOSIS — F419 Anxiety disorder, unspecified: Secondary | ICD-10-CM | POA: Diagnosis not present

## 2016-08-31 HISTORY — PX: IR FLUORO GUIDE PORT INSERTION RIGHT: IMG5741

## 2016-08-31 HISTORY — PX: IR US GUIDE VASC ACCESS RIGHT: IMG2390

## 2016-08-31 LAB — BASIC METABOLIC PANEL
ANION GAP: 9 (ref 5–15)
BUN: 22 mg/dL — ABNORMAL HIGH (ref 6–20)
CALCIUM: 8.9 mg/dL (ref 8.9–10.3)
CO2: 20 mmol/L — ABNORMAL LOW (ref 22–32)
Chloride: 108 mmol/L (ref 101–111)
Creatinine, Ser: 1.08 mg/dL (ref 0.61–1.24)
Glucose, Bld: 91 mg/dL (ref 65–99)
Potassium: 4.6 mmol/L (ref 3.5–5.1)
Sodium: 137 mmol/L (ref 135–145)

## 2016-08-31 LAB — CBC WITH DIFFERENTIAL/PLATELET
BASOS ABS: 0 10*3/uL (ref 0.0–0.1)
Basophils Relative: 1 %
EOS ABS: 0 10*3/uL (ref 0.0–0.7)
Eosinophils Relative: 1 %
HCT: 28.5 % — ABNORMAL LOW (ref 39.0–52.0)
HEMOGLOBIN: 9.1 g/dL — AB (ref 13.0–17.0)
LYMPHS PCT: 22 %
Lymphs Abs: 0.8 10*3/uL (ref 0.7–4.0)
MCH: 37.4 pg — ABNORMAL HIGH (ref 26.0–34.0)
MCHC: 31.9 g/dL (ref 30.0–36.0)
MCV: 117.3 fL — ABNORMAL HIGH (ref 78.0–100.0)
Monocytes Absolute: 0.3 10*3/uL (ref 0.1–1.0)
Monocytes Relative: 7 %
NEUTROS PCT: 69 %
Neutro Abs: 2.6 10*3/uL (ref 1.7–7.7)
Platelets: 335 10*3/uL (ref 150–400)
RBC: 2.43 MIL/uL — ABNORMAL LOW (ref 4.22–5.81)
RDW: 16.1 % — ABNORMAL HIGH (ref 11.5–15.5)
WBC: 3.7 10*3/uL — ABNORMAL LOW (ref 4.0–10.5)

## 2016-08-31 LAB — CULTURE, FUNGUS WITHOUT SMEAR

## 2016-08-31 LAB — PROTIME-INR
INR: 1.07
Prothrombin Time: 14 seconds (ref 11.4–15.2)

## 2016-08-31 MED ORDER — NALOXONE HCL 0.4 MG/ML IJ SOLN
INTRAMUSCULAR | Status: AC
  Filled 2016-08-31: qty 1

## 2016-08-31 MED ORDER — LIDOCAINE-EPINEPHRINE (PF) 2 %-1:200000 IJ SOLN
INTRAMUSCULAR | Status: AC | PRN
  Administered 2016-08-31: 20 mL

## 2016-08-31 MED ORDER — FENTANYL CITRATE (PF) 100 MCG/2ML IJ SOLN
INTRAMUSCULAR | Status: AC
  Filled 2016-08-31: qty 4

## 2016-08-31 MED ORDER — MIDAZOLAM HCL 2 MG/2ML IJ SOLN
INTRAMUSCULAR | Status: AC
  Filled 2016-08-31: qty 4

## 2016-08-31 MED ORDER — MIDAZOLAM HCL 2 MG/2ML IJ SOLN
INTRAMUSCULAR | Status: AC | PRN
  Administered 2016-08-31: 1 mg via INTRAVENOUS
  Administered 2016-08-31 (×2): 0.5 mg via INTRAVENOUS

## 2016-08-31 MED ORDER — SODIUM CHLORIDE 0.9 % IV SOLN
INTRAVENOUS | Status: DC
  Administered 2016-08-31: 12:00:00 via INTRAVENOUS

## 2016-08-31 MED ORDER — LIDOCAINE HCL 1 % IJ SOLN: INTRAMUSCULAR | Status: AC | PRN

## 2016-08-31 MED ORDER — LIDOCAINE-EPINEPHRINE (PF) 2 %-1:200000 IJ SOLN
INTRAMUSCULAR | Status: AC
  Filled 2016-08-31: qty 20

## 2016-08-31 MED ORDER — CEFAZOLIN SODIUM-DEXTROSE 2-4 GM/100ML-% IV SOLN
INTRAVENOUS | Status: AC
  Filled 2016-08-31: qty 100

## 2016-08-31 MED ORDER — HEPARIN SOD (PORK) LOCK FLUSH 100 UNIT/ML IV SOLN
INTRAVENOUS | Status: AC
  Filled 2016-08-31: qty 5

## 2016-08-31 MED ORDER — FLUMAZENIL 0.5 MG/5ML IV SOLN
INTRAVENOUS | Status: AC
  Filled 2016-08-31: qty 5

## 2016-08-31 MED ORDER — CEFAZOLIN SODIUM-DEXTROSE 2-4 GM/100ML-% IV SOLN
2.0000 g | INTRAVENOUS | Status: AC
  Administered 2016-08-31: 2 g via INTRAVENOUS

## 2016-08-31 MED ORDER — HEPARIN SOD (PORK) LOCK FLUSH 100 UNIT/ML IV SOLN
INTRAVENOUS | Status: AC | PRN
  Administered 2016-08-31: 500 [IU] via INTRAVENOUS

## 2016-08-31 MED ORDER — FENTANYL CITRATE (PF) 100 MCG/2ML IJ SOLN
INTRAMUSCULAR | Status: AC | PRN
  Administered 2016-08-31 (×2): 25 ug via INTRAVENOUS
  Administered 2016-08-31: 50 ug via INTRAVENOUS

## 2016-08-31 NOTE — H&P (Signed)
Chief Complaint: Patient was seen in consultation today for No chief complaint on file.  at the request of Gorsuch,Ni  Referring Physician(s): [name]  Supervising Physician: Arne Cleveland  Patient Status: [facility] - Out-pt  History of Present Illness: Adrian TORRANCE is a 66 y.o. male smoker with a history of laryngeal CA, followed by Dr Alvy Bimler.   He has a CT chest and then a PET that show B pulmonary nodules in the left upper lobe and the RLL that are hypermetabolic, superimposed on severe emphysema.   He was referred for needle bx, but this was deferred due to PTX risk.  Dr. Lamonte Sakai perform a bronchoscopy and biopsy on 08/10/2016 which revealed squamous cell and adenocarcinoma  He is here today for placement of a Port A Cath  He feels OK today. No fever chills. He is NPO.  Past Medical History:  Diagnosis Date  . Anxiety   . Arthritis    rheumatoid  . Cancer ([place]) 03/11/2013   larnyx/epiglottic Squamous cell in situ  . Emphysema of lung ([place])   . Esophageal reflux    not current  . History of blood transfusion   . History of radiation therapy 05/13/2013-06/28/2013   68 gray to epiglottis/neck  . Megaloblastic anemia   . Pneumonia 01/2013  . Seizures ([place])    last one was 8 years ago- 2008  . Shortness of breath    with exertion  . Throat pain 06/13/2013    Past Surgical History:  Procedure Laterality Date  . ABDOMINAL HERNIA REPAIR    . COLONOSCOPY    . MINOR PLACEMENT OF FIDUCIAL  08/10/2016   Procedure: MINOR PLACEMENT OF FIDUCIAL x3;  Surgeon: Collene Gobble, MD;  Location: [facility];  Service: Thoracic;;  . MULTIPLE EXTRACTIONS WITH ALVEOLOPLASTY N/A 04/16/2013   Procedure: Extraction of tooth #'s 2,5,6,7,10,11,15,17,21,22,27,29 with alveoloplasty, bilateral maxillary fibrous tuberosity reductions, removal of excess tissues of mandibular arch, and mandibular left lingual exostoses reductions.;  Surgeon: Lenn Cal, DDS;  Location: [facility];  Service: Oral  Surgery;  Laterality: N/A;  . PEG PLACEMENT  2014  . tracheostomy  2014  . VIDEO BRONCHOSCOPY WITH ENDOBRONCHIAL NAVIGATION N/A 08/10/2016   Procedure: VIDEO BRONCHOSCOPY WITH ENDOBRONCHIAL NAVIGATION;  Surgeon: Collene Gobble, MD;  Location: [facility] OR;  Service: Thoracic;  Laterality: N/A;    Allergies: No known allergies  Medications: Prior to Admission medications   Medication Sig Start Date End Date Taking? Authorizing Provider  budesonide-formoterol (SYMBICORT) 80-4.5 MCG/ACT inhaler Inhale 2 puffs into the lungs 2 (two) times daily as needed (Short of breath).   Yes Historical Provider, MD  cyanocobalamin 500 MCG tablet Take 500 mcg by mouth daily.   Yes Historical Provider, MD  ferrous sulfate 325 (65 FE) MG tablet Take 325 mg by mouth daily with breakfast.   Yes Historical Provider, MD  folic acid (FOLVITE) 1 MG tablet Take 1 mg by mouth daily.   Yes Historical Provider, MD  levETIRAcetam (KEPPRA) 500 MG tablet Take 1 tablet (500 mg total) by mouth 2 (two) times daily. 01/07/16  Yes Garvin Fila, MD  mometasone (NASONEX) 50 MCG/ACT nasal spray Place 2 sprays into both nostrils daily.   Yes Historical Provider, MD  vitamin E 1000 UNIT capsule Take 1,000 Units by mouth daily.    Yes Historical Provider, MD  diazepam (VALIUM) 5 MG tablet Take 5 mg by mouth every 6 (six) hours as needed for anxiety.     Historical Provider, MD  predniSONE (DELTASONE) 5 MG tablet Take 1 tablet (5 mg total) by mouth daily with breakfast. Patient not taking: Reported on 08/17/2016 07/11/16   [name], MD     Family History  Problem Relation Age of Onset  . Hypertension Mother   . Arthritis/Rheumatoid Mother   . Stroke Mother     Social History   Social History  . Marital status: Married    Spouse name: Hassan Rowan  . Number of children: 3  . Years of education: 12th   Occupational History  . Retired Other   Social History Main Topics  . Smoking status: Former Smoker    Packs/day: 0.12    Years:  40.00    Types: Cigarettes  . Smokeless tobacco: Never Used     Comment: smoke 1 to 2 a day  . Alcohol use No  . Drug use: No  . Sexual activity: Not Asked   Other Topics Concern  . None   Social History Narrative   04/12/2013   The patient is married with 3 children. (2 girls and one boy)    The patient has a history of smoking a quarter pack per day for 15 years.   The patient is trying to quit on his own.   Patient has not had any alcohol for the past 6 months by report. Patient usually drinks vodka.   Caffeine Use: 2 cups daily            Review of Systems: A 12 point ROS discussed  Review of Systems  Constitutional: Negative.   HENT: Negative.   Respiratory: Negative.   Cardiovascular: Negative.   Gastrointestinal: Negative.   Genitourinary: Negative.   Musculoskeletal: Negative.   Neurological: Negative.   Hematological: Negative.   Psychiatric/Behavioral: Negative.     Vital Signs: BP 120/64 (BP Location: Right Arm)   Pulse 78   Temp 97.7 F (36.5 C) (Oral)   Resp 18   Ht 6' (1.829 m)   Wt 112 lb 12.8 oz (51.2 kg)   SpO2 100%   BMI 15.30 kg/m   Physical Exam  Constitutional: He is oriented to person, place, and time.  Thin  HENT:  Head: Normocephalic and atraumatic.  Eyes: EOM are normal.  Neck: Normal range of motion.  Cardiovascular: Normal rate and regular rhythm.   No murmur heard. Pulmonary/Chest: Effort normal and breath sounds normal. No respiratory distress. He has no wheezes.  Abdominal: Soft. He exhibits no distension. There is no tenderness.  Musculoskeletal: Normal range of motion.  Neurological: He is alert and oriented to person, place, and time.  Skin: Skin is warm and dry.  Psychiatric: He has a normal mood and affect. His behavior is normal. Judgment and thought content normal.  Vitals reviewed.   Mallampati Score:  MD Evaluation Airway: WNL Heart: WNL Abdomen: WNL Chest/ Lungs: WNL ASA  Classification:  3 Mallampati/Airway Score: One  Imaging: Dg Chest Port 1 View  Result Date: 08/10/2016 CLINICAL DATA:  S post bronchoscopy with bilateral biopsies. EXAM: PORTABLE CHEST 1 VIEW COMPARISON:  Chest x-ray of June 07, 2016 FINDINGS: The lungs are well-expanded. No post biopsy pneumothorax is observed. There is no pleural effusion. The interstitial markings are coarse patchy density in the upper lobes and left lower and right mid lung are present and stable. Multiple markers have been placed at the site of biopsies bilaterally. The heart and pulmonary vascularity are normal. The mediastinum is normal in width. There is calcification in the wall of the aortic  arch. IMPRESSION: No postprocedure complication following bronchoscopy with bilateral biopsies. Electronically Signed   By: David  Martinique M.D.   On: 08/10/2016 13:27   Dg C-arm Bronchoscopy  Result Date: 08/10/2016 C-ARM BRONCHOSCOPY: Fluoroscopy was utilized by the requesting physician.  No radiographic interpretation.    Labs:  CBC:  Recent Labs  07/26/16 1105 07/27/16 0931 08/10/16 0917 08/17/16 0837  WBC 4.1 3.5* 3.3* 3.5*  HGB 9.0* 8.7* 8.2* 9.3*  HCT 27.6* 27.4* 26.0* 28.1*  PLT 254 237 289 301    COAGS:  Recent Labs  07/27/16 0931  INR 1.23    BMP:  Recent Labs  06/29/16 1201 07/27/16 0931  NA  --  137  K  --  4.1  CL  --  107  CO2  --  23  GLUCOSE  --  84  BUN  --  15  CALCIUM  --  8.9  CREATININE 0.90 0.85  GFRNONAA  --  >60  GFRAA  --  >60    LIVER FUNCTION TESTS: No results for input(s): BILITOT, AST, ALT, ALKPHOS, PROT, ALBUMIN in the last 8760 hours.  TUMOR MARKERS: No results for input(s): AFPTM, CEA, CA199, CHROMGRNA in the last 8760 hours.  Assessment and Plan:  Lung cancer  Will proceed with placement of Port A Cath today by Dr. Vernard Gambles  Risks and Benefits discussed with the patient including, but not limited to bleeding, infection, pneumothorax, or fibrin sheath development and need  for additional procedures.  All of the patient's questions were answered, patient is agreeable to proceed. Consent signed and in chart.  Thank you for this interesting consult.  I greatly enjoyed meeting GOTTI ALWIN and look forward to participating in their care.  A copy of this report was sent to the requesting provider on this date.  Electronically Signed: Murrell Redden PA-C 08/31/2016, 12:18 PM   I spent a total of  30 Minutes   in face to face in clinical consultation, greater than 50% of which was counseling/coordinating care for [facility] A Cath

## 2016-08-31 NOTE — Procedures (Signed)
R IJ Port cathter placement with US and fluoroscopy No complication No blood loss. See complete dictation in [facility] PACS.  

## 2016-08-31 NOTE — Discharge Instructions (Signed)
Moderate Conscious Sedation, Adult, Care After °These instructions provide you with information about caring for yourself after your procedure. Your health care provider may also give you more specific instructions. Your treatment has been planned according to current medical practices, but problems sometimes occur. Call your health care provider if you have any problems or questions after your procedure. °What can I expect after the procedure? °After your procedure, it is common: °· To feel sleepy for several hours. °· To feel clumsy and have poor balance for several hours. °· To have poor judgment for several hours. °· To vomit if you eat too soon. °Follow these instructions at home: °For at least 24 hours after the procedure:  ° °· Do not: °¨ Participate in activities where you could fall or become injured. °¨ Drive. °¨ Use heavy machinery. °¨ Drink alcohol. °¨ Take sleeping pills or medicines that cause drowsiness. °¨ Make important decisions or sign legal documents. °¨ Take care of children on your own. °· Rest. °Eating and drinking  °· Follow the diet recommended by your health care provider. °· If you vomit: °¨ Drink water, juice, or soup when you can drink without vomiting. °¨ Make sure you have little or no nausea before eating solid foods. °General instructions  °· Have a responsible adult stay with you until you are awake and alert. °· Take over-the-counter and prescription medicines only as told by your health care provider. °· If you smoke, do not smoke without supervision. °· Keep all follow-up visits as told by your health care provider. This is important. °Contact a health care provider if: °· You keep feeling nauseous or you keep vomiting. °· You feel light-headed. °· You develop a rash. °· You have a fever. °Get help right away if: °· You have trouble breathing. °This information is not intended to replace advice given to you by your health care provider. Make sure you discuss any questions you  have with your health care provider. °Document Released: 02/13/2013 Document Revised: 09/28/2015 Document Reviewed: 08/15/2015 °Elsevier Interactive Patient Education © 2017 [employer] ° ° °Implanted Port Insertion, Care After °This sheet gives you information about how to care for yourself after your procedure. Your health care provider may also give you more specific instructions. If you have problems or questions, contact your health care provider. °What can I expect after the procedure? °After your procedure, it is common to have: °· Discomfort at the port insertion site. °· Bruising on the skin over the port. This should improve over 3-4 days. °Follow these instructions at home: °Port care  °· After your port is placed, you will get a manufacturer's information card. The card has information about your port. Keep this card with you at all times. °· Take care of the port as told by your health care provider. Ask your health care provider if you or a family member can get training for taking care of the port at home. A home health care nurse may also take care of the port. °· Make sure to remember what type of port you have. °Incision care  °· Follow instructions from your health care provider about how to take care of your port insertion site. Make sure you: °¨ Wash your hands with soap and water before you change your bandage (dressing). If soap and water are not available, use hand sanitizer. °¨ Change your dressing as told by your health care provider. °¨ Leave stitches (sutures), skin glue, or adhesive strips in place. These skin   closures may need to stay in place for 2 weeks or longer. If adhesive strip edges start to loosen and curl up, you may trim the loose edges. Do not remove adhesive strips completely unless your health care provider tells you to do that. °· Check your port insertion site every day for signs of infection. Check for: °¨ More redness, swelling, or pain. °¨ More fluid or  blood. °¨ Warmth. °¨ Pus or a bad smell. °General instructions  °· Do not take baths, swim, or use a hot tub until your health care provider approves. °· Do not lift anything that is heavier than 10 lb (4.5 kg) for a week, or as told by your health care provider. °· Ask your health care provider when it is okay to: °¨ Return to work or school. °¨ Resume usual physical activities or sports. °· Do not drive for 24 hours if you were given a medicine to help you relax (sedative). °· Take over-the-counter and prescription medicines only as told by your health care provider. °· Wear a medical alert bracelet in case of an emergency. This will tell any health care providers that you have a port. °· Keep all follow-up visits as told by your health care provider. This is important. °Contact a health care provider if: °· You cannot flush your port with saline as directed, or you cannot draw blood from the port. °· You have a fever or chills. °· You have more redness, swelling, or pain around your port insertion site. °· You have more fluid or blood coming from your port insertion site. °· Your port insertion site feels warm to the touch. °· You have pus or a bad smell coming from the port insertion site. °Get help right away if: °· You have chest pain or shortness of breath. °· You have bleeding from your port that you cannot control. °Summary °· Take care of the port as told by your health care provider. °· Change your dressing as told by your health care provider. °· Keep all follow-up visits as told by your health care provider. °This information is not intended to replace advice given to you by your health care provider. Make sure you discuss any questions you have with your health care provider. °Document Released: 02/13/2013 Document Revised: 03/16/2016 Document Reviewed: 03/16/2016 °Elsevier Interactive Patient Education © 2017 [employer] Inc[employer] ° °

## 2016-09-01 ENCOUNTER — Encounter (HOSPITAL_COMMUNITY): Payer: Self-pay

## 2016-09-05 DIAGNOSIS — C329 Malignant neoplasm of larynx, unspecified: Secondary | ICD-10-CM | POA: Diagnosis not present

## 2016-09-05 DIAGNOSIS — C3490 Malignant neoplasm of unspecified part of unspecified bronchus or lung: Secondary | ICD-10-CM | POA: Diagnosis not present

## 2016-09-07 ENCOUNTER — Ambulatory Visit (HOSPITAL_BASED_OUTPATIENT_CLINIC_OR_DEPARTMENT_OTHER): Payer: Medicare Other | Admitting: Hematology and Oncology

## 2016-09-07 ENCOUNTER — Other Ambulatory Visit (HOSPITAL_BASED_OUTPATIENT_CLINIC_OR_DEPARTMENT_OTHER): Payer: Medicare Other

## 2016-09-07 ENCOUNTER — Ambulatory Visit (HOSPITAL_BASED_OUTPATIENT_CLINIC_OR_DEPARTMENT_OTHER): Payer: Medicare Other

## 2016-09-07 ENCOUNTER — Telehealth: Payer: Self-pay | Admitting: Hematology and Oncology

## 2016-09-07 VITALS — BP 120/65 | HR 76 | Temp 98.1°F | Resp 18 | Ht 72.0 in | Wt 112.5 lb

## 2016-09-07 DIAGNOSIS — D4620 Refractory anemia with excess of blasts, unspecified: Secondary | ICD-10-CM

## 2016-09-07 DIAGNOSIS — Z7189 Other specified counseling: Secondary | ICD-10-CM

## 2016-09-07 DIAGNOSIS — C3412 Malignant neoplasm of upper lobe, left bronchus or lung: Secondary | ICD-10-CM

## 2016-09-07 DIAGNOSIS — Z8589 Personal history of malignant neoplasm of other organs and systems: Secondary | ICD-10-CM

## 2016-09-07 DIAGNOSIS — C801 Malignant (primary) neoplasm, unspecified: Secondary | ICD-10-CM

## 2016-09-07 DIAGNOSIS — C7802 Secondary malignant neoplasm of left lung: Secondary | ICD-10-CM

## 2016-09-07 LAB — CBC WITH DIFFERENTIAL/PLATELET
BASO%: 0.9 % (ref 0.0–2.0)
Basophils Absolute: 0 10*3/uL (ref 0.0–0.1)
EOS%: 1 % (ref 0.0–7.0)
Eosinophils Absolute: 0 10*3/uL (ref 0.0–0.5)
HEMATOCRIT: 26.8 % — AB (ref 38.4–49.9)
HEMOGLOBIN: 8.9 g/dL — AB (ref 13.0–17.1)
LYMPH#: 0.8 10*3/uL — AB (ref 0.9–3.3)
LYMPH%: 22.6 % (ref 14.0–49.0)
MCH: 39.1 pg — ABNORMAL HIGH (ref 27.2–33.4)
MCHC: 33.3 g/dL (ref 32.0–36.0)
MCV: 117.6 fL — ABNORMAL HIGH (ref 79.3–98.0)
MONO#: 0.3 10*3/uL (ref 0.1–0.9)
MONO%: 9.1 % (ref 0.0–14.0)
NEUT%: 66.4 % (ref 39.0–75.0)
NEUTROS ABS: 2.3 10*3/uL (ref 1.5–6.5)
PLATELETS: 275 10*3/uL (ref 140–400)
RBC: 2.28 10*6/uL — ABNORMAL LOW (ref 4.20–5.82)
RDW: 16.5 % — AB (ref 11.0–14.6)
WBC: 3.5 10*3/uL — AB (ref 4.0–10.3)

## 2016-09-07 MED ORDER — LIDOCAINE-PRILOCAINE 2.5-2.5 % EX CREA: TOPICAL_CREAM | CUTANEOUS | 3 refills | Status: DC

## 2016-09-07 MED ORDER — ONDANSETRON HCL 8 MG PO TABS: 8.0000 mg | ORAL_TABLET | Freq: Two times a day (BID) | ORAL | 1 refills | Status: DC | PRN

## 2016-09-07 MED ORDER — DARBEPOETIN ALFA 500 MCG/ML IJ SOSY
500.0000 ug | PREFILLED_SYRINGE | Freq: Once | INTRAMUSCULAR | Status: AC
  Administered 2016-09-07: 500 ug via SUBCUTANEOUS
  Filled 2016-09-07: qty 1

## 2016-09-07 MED ORDER — PROCHLORPERAZINE MALEATE 10 MG PO TABS: 10.0000 mg | ORAL_TABLET | Freq: Four times a day (QID) | ORAL | 1 refills | Status: DC | PRN

## 2016-09-07 NOTE — Patient Instructions (Signed)
Darbepoetin Alfa injection (Aranesp) What is this medicine? DARBEPOETIN ALFA (dar be POE e tin AL fa) helps your body make more red blood cells. It is used to treat anemia caused by chronic kidney failure and chemotherapy. This medicine may be used for other purposes; ask your health care provider or pharmacist if you have questions. What should I tell my health care provider before I take this medicine? They need to know if you have any of these conditions: -blood clotting disorders or history of blood clots -cancer patient not on chemotherapy -cystic fibrosis -heart disease, such as angina, heart failure, or a history of a heart attack -hemoglobin level of 12 g/dL or greater -high blood pressure -low levels of folate, iron, or vitamin B12 -seizures -an unusual or allergic reaction to darbepoetin, erythropoietin, albumin, hamster proteins, latex, other medicines, foods, dyes, or preservatives -pregnant or trying to get pregnant -breast-feeding How should I use this medicine? This medicine is for injection into a vein or under the skin. It is usually given by a health care professional in a hospital or clinic setting. If you get this medicine at home, you will be taught how to prepare and give this medicine. Do not shake the solution before you withdraw a dose. Use exactly as directed. Take your medicine at regular intervals. Do not take your medicine more often than directed. It is important that you put your used needles and syringes in a special sharps container. Do not put them in a trash can. If you do not have a sharps container, call your pharmacist or healthcare provider to get one. Talk to your pediatrician regarding the use of this medicine in children. While this medicine may be used in children as young as 1 year for selected conditions, precautions do apply. Overdosage: If you think you have taken too much of this medicine contact a poison control center or emergency room at  once. NOTE: This medicine is only for you. Do not share this medicine with others. What if I miss a dose? If you miss a dose, take it as soon as you can. If it is almost time for your next dose, take only that dose. Do not take double or extra doses. What may interact with this medicine? Do not take this medicine with any of the following medications: -epoetin alfa This list may not describe all possible interactions. Give your health care provider a list of all the medicines, herbs, non-prescription drugs, or dietary supplements you use. Also tell them if you smoke, drink alcohol, or use illegal drugs. Some items may interact with your medicine. What should I watch for while using this medicine? Visit your prescriber or health care professional for regular checks on your progress and for the needed blood tests and blood pressure measurements. It is especially important for the doctor to make sure your hemoglobin level is in the desired range, to limit the risk of potential side effects and to give you the best benefit. Keep all appointments for any recommended tests. Check your blood pressure as directed. Ask your doctor what your blood pressure should be and when you should contact him or her. As your body makes more red blood cells, you may need to take iron, folic acid, or vitamin B supplements. Ask your doctor or health care provider which products are right for you. If you have kidney disease continue dietary restrictions, even though this medication can make you feel better. Talk with your doctor or health care professional about  the foods you eat and the vitamins that you take. What side effects may I notice from receiving this medicine? Side effects that you should report to your doctor or health care professional as soon as possible: -allergic reactions like skin rash, itching or hives, swelling of the face, lips, or tongue -breathing problems -changes in vision -chest pain -confusion,  trouble speaking or understanding -feeling faint or lightheaded, falls -high blood pressure -muscle aches or pains -pain, swelling, warmth in the leg -rapid weight gain -severe headaches -sudden numbness or weakness of the face, arm or leg -trouble walking, dizziness, loss of balance or coordination -seizures (convulsions) -swelling of the ankles, feet, hands -unusually weak or tired Side effects that usually do not require medical attention (report to your doctor or health care professional if they continue or are bothersome): -diarrhea -fever, chills (flu-like symptoms) -headaches -nausea, vomiting -redness, stinging, or swelling at site where injected This list may not describe all possible side effects. Call your doctor for medical advice about side effects. You may report side effects to FDA at 1-800-FDA-1088. Where should I keep my medicine? Keep out of the reach of children. Store in a refrigerator between 2 and 8 degrees C (36 and 46 degrees F). Do not freeze. Do not shake. Throw away any unused portion if using a single-dose vial. Throw away any unused medicine after the expiration date. NOTE: This sheet is a summary. It may not cover all possible information. If you have questions about this medicine, talk to your doctor, pharmacist, or health care provider.    2016, [employer]vier/[employer] Standard. (2008-04-08 10:23:57)

## 2016-09-07 NOTE — Progress Notes (Signed)
START OFF PATHWAY REGIMEN - Non-Small Cell Lung   [id number]:Carboplatin AUC=2 + Paclitaxel 45 mg/m2 Weekly:   Administer weekly:     Paclitaxel      Carboplatin   **Always confirm dose/schedule in your pharmacy ordering system**    Patient Characteristics: Stage IV Metastatic, Non Squamous, Initial Chemotherapy/Immunotherapy, PS = 0, 1, PD-L1 Expression Positive 1-49% (TPS) / Negative / Not Tested / Not a Candidate for Immunotherapy AJCC T Category: T4 Current Disease Status: Distant Metastases AJCC N Category: N0 AJCC M Category: M1 AJCC 8 Stage Grouping: Unknown Histology: Non Squamous Cell ROS1 Rearrangement Status: Negative T790M Mutation Status: Not Applicable - EGFR Mutation Negative/Unknown Other Mutations/Biomarkers: No Other Actionable Mutations PD-L1 Expression Status: PD-L1 Negative Chemotherapy/Immunotherapy LOT: Initial Chemotherapy/Immunotherapy Molecular Targeted Therapy: Not Appropriate ALK Translocation Status: Negative Would you be surprised if this patient died  in the next year? I would be surprised if this patient died in the next year EGFR Mutation Status: Non-Sensitizing BRAF V600E Mutation Status: Negative Performance Status: PS = 0, 1  Intent of Therapy: Non-Curative / Palliative Intent, Discussed with Patient

## 2016-09-07 NOTE — Telephone Encounter (Signed)
Gave patient AVS and calender per 5/2 los.

## 2016-09-08 ENCOUNTER — Encounter: Payer: Self-pay | Admitting: *Deleted

## 2016-09-08 ENCOUNTER — Encounter: Payer: Self-pay | Admitting: Hematology and Oncology

## 2016-09-08 ENCOUNTER — Institutional Professional Consult (permissible substitution): Payer: Self-pay | Admitting: Emergency Medicine

## 2016-09-08 DIAGNOSIS — Z7189 Other specified counseling: Secondary | ICD-10-CM | POA: Insufficient documentation

## 2016-09-08 NOTE — Progress Notes (Signed)
[place] OFFICE PROGRESS NOTE  Patient Care Team: [name], MD as PCP - General (Family Medicine) [name], MD as Consulting Physician (Hematology and Oncology)  SUMMARY OF ONCOLOGIC HISTORY: Oncology History   MDS, R-IPSS score of 3, low risk (Hg 8.9, +16 chromosome on BM, 2% blast count)   Squamous cell carcinoma of the epiglottis   Primary site: Larynx - Supraglottis (Left)   Staging method: AJCC 7th Edition   Clinical: Stage I (T1, N0, M0) signed by [name], MD on 04/30/2013 12:49 PM   Pathologic: Stage I (T1, N0, cM0) signed by [name], MD on 04/30/2013 12:49 PM   Summary: Stage I (T1, N0, cM0)       MDS (myelodysplastic syndrome), low grade ([place])   02/01/2007 Bone Marrow Biopsy    BM biopsy was abnormal, overall probable low grade MDS      03/23/2011 - 02/06/2013 Chemotherapy    [name], discontinued due to diagnosis of laryngeal ca      09/06/2013 Bone Marrow Biopsy    Repeat bone marrow aspirate and biopsy confirmed low-grade myelodysplastic syndrome.      09/17/2013 -  Chemotherapy    The patient resumed darbepoetin injections to treat the anemia.       History of head and neck cancer   03/06/2013 Procedure    Biopsy from epiglottic region showed invasive [facility]      04/25/2013 Imaging    Ct scan showed no other involvement in the LN      05/13/2013 - 06/28/2013 Radiation Therapy    He received radiation therapy, 70 gray in 35 fractions to the larynx      06/09/2016 Imaging    CT scan of the chest showed Bilateral spiculated soft tissue pulmonary masses, highly suspicious for multifocal malignancy. Borderline enlarged left-sided mediastinal lymphadenopathy. Background of severe emphysematous changes, chronic cylindrical bronchiectasis and hyperinflation of the lungs. Findings consistent with longstanding COPD. Diffusely thickened interstitial markings. This may represent chronic interstitial lung changes, however  lymphangitic spread of malignancy cannot be excluded. Anterior compression deformities of several of the thoracic vertebral bodies, with heterogeneous appearance of T7 and T9 vertebral body. This may represent degenerative changes, however osseous metastatic disease cannot be excluded.       07/11/2016 PET scan    There are 2 nodules in the left upper lobe and 2 nodules within the right lower lobe which have spiculated margin and exhibit intense radiotracer uptake compatible with multifocal pulmonary metastasis versus multiple synchronous primary pulmonary neoplasms. 2. Emphysema 3. Aortic atherosclerosis and multi vessel coronary artery calcification.       Cancer of upper lobe of left lung ([place])   06/09/2016 Imaging    CT chest: Bilateral spiculated soft tissue pulmonary masses, highly suspicious for multifocal malignancy. Borderline enlarged left-sided mediastinal lymphadenopathy. Background of severe emphysematous changes, chronic cylindrical bronchiectasis and hyperinflation of the lungs. Findings consistent with longstanding COPD. Diffusely thickened interstitial markings. This may represent chronic interstitial lung changes, however lymphangitic spread of malignancy cannot be excluded. Anterior compression deformities of several of the thoracic vertebral bodies, with heterogeneous appearance of T7 and T9 vertebral body. This may represent degenerative changes, however osseous metastatic disease cannot be excluded.       06/29/2016 Imaging    MRI brain: No evidence of metastatic disease or recent infarction. Advanced generalized brain atrophy with moderate to marked chronic small-vessel ischemic changes throughout. FLAIR imaging appears to show scattered gyri showing FLAIR hyperintensity. These abnormalities are not  confirmed with restricted diffusion or contrast enhancement. Therefore, we are not certain that this is not artifactual. The differential diagnosis does include true pathology such  as posterior reversible encephalopathy, viral or prion disease, postictal change in post chemotherapy change. If there is concern about active CNS pathology, re- scanning in 4-6 weeks could be useful.      07/27/2016 Imaging    CT chest: Bilateral pulmonary lesions. These are suspicious for metastatic disease. Lesions have minimally changed but there may be slight enlargement of the cavitary lesion in the right lower lobe. Severe emphysematous changes.       08/10/2016 Pathology Results    1. Lung, biopsy, RUL, (apical segment) - SCANT BENIGN LUNG PARENCHYMA. - THERE IS NO EVIDENCE OF MALIGNANCY. 2. Lung, biopsy, LUL lingula - SQUAMOUS CELL CARCINOMA. - SEE COMMENT. 3. Lung, biopsy, LUL - ADENOCARCINOMA. - SEE COMMENT. Microscopic Comment 2. The malignant cells are positive for cytokeratin 56 and p63. They are negative for TTF-1. The findings are consistent with squamous cell carcinoma. There is likely insufficient tissue remaining for additional studies, if requested. 3. The malignant cells are positive for TTF-1 and negative for p63 and cytokeratin 5/6. The findings are consistent with adenocarcinoma.      08/10/2016 Procedure    The targets were defined as follows: Left upper lobe nodule, target 1 Lingular nodule, target 2 Proximal right lower lobe nodule, target 3 More distal right lower lobe, nodule  The extendable working channel was secured into place and the locator guide was withdrawn. Under fluoroscopic guidance transbronchial needle brushings, transbronchial Wang needle biopsies, and transbronchial forceps biopsies were performed in the LUL (target 1) and the lingula (target 2) to be sent for cytology and pathology. A bronchioalveolar lavage was performed in the lingula in the vicinity of target 2  and sent for cytology and microbiology (bacterial, fungal, AFB smears and cultures).  Fiducial markers were then placed around target 1, target 2, and in the right lower lobe in the  vicinity of both target 3 and target 4 to be used for possible radiation therapy should this be indicated. At the end of the procedure a general airway inspection was performed and there was no evidence of active bleeding. The bronchoscope was removed.  The patient tolerated the procedure well. There was no significant blood loss and there were no obvious complications. A post-procedural chest x-ray is pending.  Samples: 1. Transbronchial needle brushings from targets 1 and 2 2. Transbronchial Wang needle biopsies from targets 1 and 2 3. Transbronchial forceps biopsies from targets 1 and 2 4. Bronchoalveolar lavage from lingula, target 2 5. Endobronchial biopsies from RUL apical segment 6. Endobronchial brushings from right upper lobe apical segment       08/10/2016 Genetic Testing    Patient has genetic testing done for [employer] and PD-L1 testing Results revealed PD-L1 testing is negative 0%      08/31/2016 Procedure    Technically successful right IJ power-injectable port catheter placement. Ready for routine use       INTERVAL HISTORY: Please see below for problem oriented charting. He returns with his wife to discuss test results He tolerated recent port placement well He has quit smoking  REVIEW OF SYSTEMS:   Constitutional: Denies fevers, chills or abnormal weight loss Eyes: Denies blurriness of vision Ears, nose, mouth, throat, and face: Denies mucositis or sore throat Respiratory: Denies cough, dyspnea or wheezes Cardiovascular: Denies palpitation, chest discomfort or lower extremity swelling Gastrointestinal:  Denies nausea, heartburn or  change in bowel habits Skin: Denies abnormal skin rashes Lymphatics: Denies new lymphadenopathy or easy bruising Neurological:Denies numbness, tingling or new weaknesses Behavioral/Psych: Mood is stable, no new changes  All other systems were reviewed with the patient and are negative.  I have reviewed the past medical history,  past surgical history, social history and family history with the patient and they are unchanged from previous note.  ALLERGIES:  is allergic to no known allergies.  MEDICATIONS:  Current Outpatient Prescriptions  Medication Sig Dispense Refill  . budesonide-formoterol (SYMBICORT) 80-4.5 MCG/ACT inhaler Inhale 2 puffs into the lungs 2 (two) times daily as needed (Short of breath).    . cyanocobalamin 500 MCG tablet Take 500 mcg by mouth daily.    . ferrous sulfate 325 (65 FE) MG tablet Take 325 mg by mouth daily with breakfast.    . folic acid (FOLVITE) 1 MG tablet Take 1 mg by mouth daily.    Marland Kitchen levETIRAcetam (KEPPRA) 500 MG tablet Take 1 tablet (500 mg total) by mouth 2 (two) times daily. 60 tablet 11  . mometasone (NASONEX) 50 MCG/ACT nasal spray Place 2 sprays into both nostrils daily.    . vitamin E 1000 UNIT capsule Take 1,000 Units by mouth daily.     . diazepam (VALIUM) 5 MG tablet Take 5 mg by mouth every 6 (six) hours as needed for anxiety.     . lidocaine-prilocaine (EMLA) cream Apply to affected area once 30 g 3  . ondansetron (ZOFRAN) 8 MG tablet Take 1 tablet (8 mg total) by mouth 2 (two) times daily as needed for refractory nausea / vomiting. Start on day 3 after chemo. 30 tablet 1  . predniSONE (DELTASONE) 5 MG tablet Take 1 tablet (5 mg total) by mouth daily with breakfast. (Patient not taking: Reported on 08/17/2016) 30 tablet 1  . prochlorperazine (COMPAZINE) 10 MG tablet Take 1 tablet (10 mg total) by mouth every 6 (six) hours as needed (Nausea or vomiting). 30 tablet 1   No current facility-administered medications for this visit.     PHYSICAL EXAMINATION: ECOG PERFORMANCE STATUS: 1 - Symptomatic but completely ambulatory  Vitals:   09/07/16 1030  BP: 120/65  Pulse: 76  Resp: 18  Temp: 98.1 F (36.7 C)   Filed Weights   09/07/16 1030  Weight: 112 lb 8 oz (51 kg)    GENERAL:alert, no distress and comfortable SKIN: skin color, texture, turgor are normal, no  rashes or significant lesions EYES: normal, Conjunctiva are pink and non-injected, sclera clear OROPHARYNX:no exudate, no erythema and lips, buccal mucosa, and tongue normal  NECK: supple, thyroid normal size, non-tender, without nodularity LYMPH:  no palpable lymphadenopathy in the cervical, axillary or inguinal LUNGS: clear to auscultation and percussion with normal breathing effort HEART: regular rate & rhythm and no murmurs and no lower extremity edema ABDOMEN:abdomen soft, non-tender and normal bowel sounds Musculoskeletal:no cyanosis of digits and no clubbing  NEURO: alert & oriented x 3 with fluent speech, no focal motor/sensory deficits  LABORATORY DATA:  I have reviewed the data as listed    Component Value Date/Time   NA 137 08/31/2016 1155   NA 139 09/17/2013 1158   K 4.6 08/31/2016 1155   K 4.1 09/17/2013 1158   CL 108 08/31/2016 1155   CL 107 09/19/2012 1325   CO2 20 (L) 08/31/2016 1155   CO2 20 (L) 09/17/2013 1158   GLUCOSE 91 08/31/2016 1155   GLUCOSE 77 09/17/2013 1158   GLUCOSE 97 09/19/2012 1325  BUN 22 (H) 08/31/2016 1155   BUN 17.4 09/17/2013 1158   CREATININE 1.08 08/31/2016 1155   CREATININE 0.9 09/17/2013 1158   CALCIUM 8.9 08/31/2016 1155   CALCIUM 9.3 09/17/2013 1158   PROT 8.5 (H) 06/21/2014 0521   PROT 8.5 (H) 09/17/2013 1158   ALBUMIN 3.4 (L) 06/21/2014 0521   ALBUMIN 3.5 09/17/2013 1158   AST 15 06/21/2014 0521   AST 10 09/17/2013 1158   ALT 8 06/21/2014 0521   ALT <6 09/17/2013 1158   ALKPHOS 129 (H) 06/21/2014 0521   ALKPHOS 151 (H) 09/17/2013 1158   BILITOT 0.3 06/21/2014 0521   BILITOT 0.23 09/17/2013 1158   GFRNONAA >60 08/31/2016 1155   GFRAA >60 08/31/2016 1155    No results found for: SPEP, UPEP  Lab Results  Component Value Date   WBC 3.5 (L) 09/07/2016   NEUTROABS 2.3 09/07/2016   HGB 8.9 (L) 09/07/2016   HCT 26.8 (L) 09/07/2016   MCV 117.6 (H) 09/07/2016   PLT 275 09/07/2016      Chemistry      Component Value  Date/Time   NA 137 08/31/2016 1155   NA 139 09/17/2013 1158   K 4.6 08/31/2016 1155   K 4.1 09/17/2013 1158   CL 108 08/31/2016 1155   CL 107 09/19/2012 1325   CO2 20 (L) 08/31/2016 1155   CO2 20 (L) 09/17/2013 1158   BUN 22 (H) 08/31/2016 1155   BUN 17.4 09/17/2013 1158   CREATININE 1.08 08/31/2016 1155   CREATININE 0.9 09/17/2013 1158      Component Value Date/Time   CALCIUM 8.9 08/31/2016 1155   CALCIUM 9.3 09/17/2013 1158   ALKPHOS 129 (H) 06/21/2014 0521   ALKPHOS 151 (H) 09/17/2013 1158   AST 15 06/21/2014 0521   AST 10 09/17/2013 1158   ALT 8 06/21/2014 0521   ALT <6 09/17/2013 1158   BILITOT 0.3 06/21/2014 0521   BILITOT 0.23 09/17/2013 1158       RADIOGRAPHIC STUDIES: I have personally reviewed the radiological images as listed and agreed with the findings in the report. Ir US Guide Vasc Access Right  Result Date: 08/31/2016 CLINICAL DATA:  History of laryngeal carcinoma. Bilateral pulmonary nodules. Needs long-term durable venous access for chemotherapy regimen. EXAM: TUNNELED PORT CATHETER PLACEMENT WITH ULTRASOUND AND FLUOROSCOPIC GUIDANCE FLUOROSCOPY TIME:  0.1 minute (9 uGym2 DAP) ANESTHESIA/SEDATION: Intravenous Fentanyl and Versed were administered as conscious sedation during continuous monitoring of the patient's level of consciousness and physiological / cardiorespiratory status by the radiology RN, with a total moderate sedation time of 20 minutes. TECHNIQUE: The procedure, risks, benefits, and alternatives were explained to the patient. Questions regarding the procedure were encouraged and answered. The patient understands and consents to the procedure. As antibiotic prophylaxis, cefazolin 2 g was ordered pre-procedure and administered intravenously within one hour of incision. Patency of the right IJ vein was confirmed with ultrasound with image documentation. An appropriate skin site was determined. Skin site was marked. Region was prepped using maximum barrier  technique including cap and mask, sterile gown, sterile gloves, large sterile sheet, and Chlorhexidine as cutaneous antisepsis. The region was infiltrated locally with 1% lidocaine. Under real-time ultrasound guidance, the right IJ vein was accessed with a 21 gauge micropuncture needle; the needle tip within the vein was confirmed with ultrasound image documentation. Needle was exchanged over a 018 guidewire for transitional dilator which allowed passage of the [facility] wire into the IVC. Over this, the transitional dilator was exchanged for a 5  [place] MPA catheter. A small incision was made on the right anterior chest wall and a subcutaneous pocket fashioned. The power-injectable port was positioned and its catheter tunneled to the right IJ dermatotomy site. The MPA catheter was exchanged over an Amplatz wire for a peel-away sheath, through which the port catheter, which had been trimmed to the appropriate length, was advanced and positioned under fluoroscopy with its tip at the cavoatrial junction. Spot chest radiograph confirms good catheter position and no pneumothorax. The pocket was closed with deep interrupted and subcuticular continuous 3-0 Monocryl sutures. The port was flushed per protocol. The incisions were covered with Dermabond then covered with a sterile dressing. COMPLICATIONS: COMPLICATIONS None immediate IMPRESSION: Technically successful right IJ power-injectable port catheter placement. Ready for routine use. Electronically Signed   By: Lucrezia Europe M.D.   On: 08/31/2016 14:40   Dg Chest Port 1 View  Result Date: 08/10/2016 CLINICAL DATA:  S post bronchoscopy with bilateral biopsies. EXAM: PORTABLE CHEST 1 VIEW COMPARISON:  Chest x-ray of June 07, 2016 FINDINGS: The lungs are well-expanded. No post biopsy pneumothorax is observed. There is no pleural effusion. The interstitial markings are coarse patchy density in the upper lobes and left lower and right mid lung are present and stable.  Multiple markers have been placed at the site of biopsies bilaterally. The heart and pulmonary vascularity are normal. The mediastinum is normal in width. There is calcification in the wall of the aortic arch. IMPRESSION: No postprocedure complication following bronchoscopy with bilateral biopsies. Electronically Signed   By: David  Martinique M.D.   On: 08/10/2016 13:27   Ir Fluoro Guide Port Insertion Right  Result Date: 08/31/2016 CLINICAL DATA:  History of laryngeal carcinoma. Bilateral pulmonary nodules. Needs long-term durable venous access for chemotherapy regimen. EXAM: TUNNELED PORT CATHETER PLACEMENT WITH ULTRASOUND AND FLUOROSCOPIC GUIDANCE FLUOROSCOPY TIME:  0.1 minute (9 uGym2 DAP) ANESTHESIA/SEDATION: Intravenous Fentanyl and Versed were administered as conscious sedation during continuous monitoring of the patient's level of consciousness and physiological / cardiorespiratory status by the radiology RN, with a total moderate sedation time of 20 minutes. TECHNIQUE: The procedure, risks, benefits, and alternatives were explained to the patient. Questions regarding the procedure were encouraged and answered. The patient understands and consents to the procedure. As antibiotic prophylaxis, cefazolin 2 g was ordered pre-procedure and administered intravenously within one hour of incision. Patency of the right IJ vein was confirmed with ultrasound with image documentation. An appropriate skin site was determined. Skin site was marked. Region was prepped using maximum barrier technique including cap and mask, sterile gown, sterile gloves, large sterile sheet, and Chlorhexidine as cutaneous antisepsis. The region was infiltrated locally with 1% lidocaine. Under real-time ultrasound guidance, the right IJ vein was accessed with a 21 gauge micropuncture needle; the needle tip within the vein was confirmed with ultrasound image documentation. Needle was exchanged over a 018 guidewire for transitional dilator  which allowed passage of the [facility] wire into the IVC. Over this, the transitional dilator was exchanged for a 5 [place] MPA catheter. A small incision was made on the right anterior chest wall and a subcutaneous pocket fashioned. The power-injectable port was positioned and its catheter tunneled to the right IJ dermatotomy site. The MPA catheter was exchanged over an Amplatz wire for a peel-away sheath, through which the port catheter, which had been trimmed to the appropriate length, was advanced and positioned under fluoroscopy with its tip at the cavoatrial junction. Spot chest radiograph confirms good catheter position and no pneumothorax.  The pocket was closed with deep interrupted and subcuticular continuous 3-0 Monocryl sutures. The port was flushed per protocol. The incisions were covered with Dermabond then covered with a sterile dressing. COMPLICATIONS: COMPLICATIONS None immediate IMPRESSION: Technically successful right IJ power-injectable port catheter placement. Ready for routine use. Electronically Signed   By: Lucrezia Europe M.D.   On: 08/31/2016 14:40   Dg C-arm Bronchoscopy  Result Date: 08/10/2016 C-ARM BRONCHOSCOPY: Fluoroscopy was utilized by the requesting physician.  No radiographic interpretation.    ASSESSMENT & PLAN:  Cancer of upper lobe of left lung ([place]) I reviewed the guidelines with the patient and his wife. The patient has stage IV disease, treatment is noncurable due to bilateral lung involvement. The patient has stopped smoking. We discussed various different combination treatment versus single agent. I have ordered additional molecular studies, no actionable mutations are present We discussed common risks involved with chemotherapy including risk of pancytopenia, infection, need for blood transfusion, etc. After significant discussion, we are in agreement to pursue low-dose carboplatin and Taxol.  I will try to get G-CSF approval given his baseline severe pancytopenia We  discussed the role of chemotherapy. The intent is of palliative intent.  We discussed some of the risks, benefits, side-effects of carboplatin and taxol  Some of the short term side-effects included, though not limited to, including weight loss, life threatening infections, risk of allergic reactions, need for transfusions of blood products, nausea, vomiting, change in bowel habits, loss of hair, admission to hospital for various reasons, and risks of death.   Long term side-effects are also discussed including risks of infertility, permanent damage to nerve function, hearing loss, chronic fatigue, kidney damage with possibility needing hemodialysis, and rare secondary malignancy including bone marrow disorders.  The patient is aware that the response rates discussed earlier is not guaranteed.  After a long discussion, patient made an informed decision to proceed with the prescribed plan of care.   Patient education material was dispensed.    History of head and neck cancer I reviewed multiple imaging study with the patient and his wife. MRI of the head is negative. PET CT scan showed no evidence of local recurrence  MDS (myelodysplastic syndrome), low grade The anemia from MDS is stable. I suspect he may have some degree of bone marrow suppression from his antiseizure medications. He had responded well to Darbopeitin in the past with improvement in energy level.  The patient will get regular blood work done. The patient will continue on Aranep injection to keep hemoglobin greater than 10 g.  Goals of care, counseling/discussion The patient is aware he has incurable disease and treatment is strictly palliative. We discussed importance of Advanced Directives and Living will. I will get assistance from our social worker to help him fill out some paperwork. We discussed CODE STATUS; the patient desires to remain in full code.   Orders Placed This Encounter  Procedures  . CBC with  Differential    Standing Status:   Standing    Number of Occurrences:   20    Standing Expiration Date:   09/08/2017  . Comprehensive metabolic panel    Standing Status:   Standing    Number of Occurrences:   20    Standing Expiration Date:   09/08/2017   All questions were answered. The patient knows to call the clinic with any problems, questions or concerns. No barriers to learning was detected. I spent 30 minutes counseling the patient face to face. The total time  spent in the appointment was 40 minutes and more than 50% was on counseling and review of test results     [name], MD 09/08/2016 6:49 AM

## 2016-09-08 NOTE — Assessment & Plan Note (Signed)
The anemia from MDS is stable. I suspect he may have some degree of bone marrow suppression from his antiseizure medications. He had responded well to Darbopeitin in the past with improvement in energy level.  The patient will get regular blood work done. The patient will continue on Aranep injection to keep hemoglobin greater than 10 g.

## 2016-09-08 NOTE — Assessment & Plan Note (Signed)
I reviewed the guidelines with the patient and his wife. The patient has stage IV disease, treatment is noncurable due to bilateral lung involvement. The patient has stopped smoking. We discussed various different combination treatment versus single agent. I have ordered additional molecular studies, no actionable mutations are present We discussed common risks involved with chemotherapy including risk of pancytopenia, infection, need for blood transfusion, etc. After significant discussion, we are in agreement to pursue low-dose carboplatin and Taxol.  I will try to get G-CSF approval given his baseline severe pancytopenia We discussed the role of chemotherapy. The intent is of palliative intent.  We discussed some of the risks, benefits, side-effects of carboplatin and taxol  Some of the short term side-effects included, though not limited to, including weight loss, life threatening infections, risk of allergic reactions, need for transfusions of blood products, nausea, vomiting, change in bowel habits, loss of hair, admission to hospital for various reasons, and risks of death.   Long term side-effects are also discussed including risks of infertility, permanent damage to nerve function, hearing loss, chronic fatigue, kidney damage with possibility needing hemodialysis, and rare secondary malignancy including bone marrow disorders.  The patient is aware that the response rates discussed earlier is not guaranteed.  After a long discussion, patient made an informed decision to proceed with the prescribed plan of care.   Patient education material was dispensed.

## 2016-09-08 NOTE — Assessment & Plan Note (Signed)
The patient is aware he has incurable disease and treatment is strictly palliative. We discussed importance of Advanced Directives and Living will. I will get assistance from our social worker to help him fill out some paperwork. We discussed CODE STATUS; the patient desires to remain in full code.

## 2016-09-08 NOTE — Assessment & Plan Note (Signed)
I reviewed multiple imaging study with the patient and his wife. MRI of the head is negative. PET CT scan showed no evidence of local recurrence

## 2016-09-12 ENCOUNTER — Telehealth: Payer: Self-pay | Admitting: Hematology and Oncology

## 2016-09-12 NOTE — Telephone Encounter (Signed)
Called to confirm appt with patient for 5/16 - patient wanted treatment time moved closer to MD visit. Patient is aware of appt time and date.

## 2016-09-14 ENCOUNTER — Ambulatory Visit: Payer: Self-pay | Admitting: Emergency Medicine

## 2016-09-14 ENCOUNTER — Encounter: Payer: Self-pay | Admitting: *Deleted

## 2016-09-14 ENCOUNTER — Other Ambulatory Visit: Payer: Self-pay

## 2016-09-15 ENCOUNTER — Ambulatory Visit: Payer: Self-pay | Admitting: Emergency Medicine

## 2016-09-20 ENCOUNTER — Other Ambulatory Visit: Payer: Self-pay | Admitting: Hematology and Oncology

## 2016-09-21 ENCOUNTER — Ambulatory Visit: Payer: Medicare Other

## 2016-09-21 ENCOUNTER — Encounter: Payer: Self-pay | Admitting: Hematology and Oncology

## 2016-09-21 ENCOUNTER — Ambulatory Visit (HOSPITAL_BASED_OUTPATIENT_CLINIC_OR_DEPARTMENT_OTHER): Payer: Medicare Other

## 2016-09-21 ENCOUNTER — Other Ambulatory Visit: Payer: Self-pay | Admitting: Hematology and Oncology

## 2016-09-21 ENCOUNTER — Ambulatory Visit: Payer: Medicare Other | Admitting: Nutrition

## 2016-09-21 ENCOUNTER — Other Ambulatory Visit (HOSPITAL_BASED_OUTPATIENT_CLINIC_OR_DEPARTMENT_OTHER): Payer: Medicare Other

## 2016-09-21 ENCOUNTER — Ambulatory Visit (HOSPITAL_BASED_OUTPATIENT_CLINIC_OR_DEPARTMENT_OTHER): Payer: Medicare Other | Admitting: Hematology and Oncology

## 2016-09-21 VITALS — BP 112/56 | HR 63 | Temp 97.5°F | Resp 18

## 2016-09-21 DIAGNOSIS — C3412 Malignant neoplasm of upper lobe, left bronchus or lung: Secondary | ICD-10-CM

## 2016-09-21 DIAGNOSIS — D4620 Refractory anemia with excess of blasts, unspecified: Secondary | ICD-10-CM

## 2016-09-21 DIAGNOSIS — Z720 Tobacco use: Secondary | ICD-10-CM

## 2016-09-21 DIAGNOSIS — C801 Malignant (primary) neoplasm, unspecified: Secondary | ICD-10-CM

## 2016-09-21 DIAGNOSIS — R64 Cachexia: Secondary | ICD-10-CM

## 2016-09-21 DIAGNOSIS — Z5111 Encounter for antineoplastic chemotherapy: Secondary | ICD-10-CM

## 2016-09-21 DIAGNOSIS — C7802 Secondary malignant neoplasm of left lung: Secondary | ICD-10-CM

## 2016-09-21 LAB — CBC WITH DIFFERENTIAL/PLATELET
BASO%: 1.4 % (ref 0.0–2.0)
Basophils Absolute: 0 10*3/uL (ref 0.0–0.1)
EOS ABS: 0.1 10*3/uL (ref 0.0–0.5)
EOS%: 2.2 % (ref 0.0–7.0)
HEMATOCRIT: 26.7 % — AB (ref 38.4–49.9)
HGB: 8.5 g/dL — ABNORMAL LOW (ref 13.0–17.1)
LYMPH#: 0.8 10*3/uL — AB (ref 0.9–3.3)
LYMPH%: 28.4 % (ref 14.0–49.0)
MCH: 37.4 pg — ABNORMAL HIGH (ref 27.2–33.4)
MCHC: 31.8 g/dL — AB (ref 32.0–36.0)
MCV: 117.6 fL — AB (ref 79.3–98.0)
MONO#: 0.3 10*3/uL (ref 0.1–0.9)
MONO%: 9.7 % (ref 0.0–14.0)
NEUT#: 1.6 10*3/uL (ref 1.5–6.5)
NEUT%: 58.3 % (ref 39.0–75.0)
Platelets: 294 10*3/uL (ref 140–400)
RBC: 2.27 10*6/uL — ABNORMAL LOW (ref 4.20–5.82)
RDW: 16.1 % — ABNORMAL HIGH (ref 11.0–14.6)
WBC: 2.8 10*3/uL — ABNORMAL LOW (ref 4.0–10.3)

## 2016-09-21 LAB — COMPREHENSIVE METABOLIC PANEL
AST: 10 U/L (ref 5–34)
Albumin: 3.3 g/dL — ABNORMAL LOW (ref 3.5–5.0)
Alkaline Phosphatase: 91 U/L (ref 40–150)
Anion Gap: 9 mEq/L (ref 3–11)
BILIRUBIN TOTAL: 0.32 mg/dL (ref 0.20–1.20)
BUN: 21.8 mg/dL (ref 7.0–26.0)
CALCIUM: 9.1 mg/dL (ref 8.4–10.4)
CHLORIDE: 111 meq/L — AB (ref 98–109)
CO2: 20 mEq/L — ABNORMAL LOW (ref 22–29)
CREATININE: 1 mg/dL (ref 0.7–1.3)
Glucose: 116 mg/dl (ref 70–140)
Potassium: 4.1 mEq/L (ref 3.5–5.1)
Sodium: 140 mEq/L (ref 136–145)
TOTAL PROTEIN: 8.5 g/dL — AB (ref 6.4–8.3)

## 2016-09-21 MED ORDER — SODIUM CHLORIDE 0.9 % IV SOLN
20.0000 mg | Freq: Once | INTRAVENOUS | Status: AC
  Administered 2016-09-21: 20 mg via INTRAVENOUS
  Filled 2016-09-21: qty 2

## 2016-09-21 MED ORDER — SODIUM CHLORIDE 0.9 % IV SOLN
Freq: Once | INTRAVENOUS | Status: AC
  Administered 2016-09-21: 11:00:00 via INTRAVENOUS

## 2016-09-21 MED ORDER — PALONOSETRON HCL INJECTION 0.25 MG/5ML
INTRAVENOUS | Status: AC
  Filled 2016-09-21: qty 5

## 2016-09-21 MED ORDER — DARBEPOETIN ALFA 500 MCG/ML IJ SOSY
500.0000 ug | PREFILLED_SYRINGE | Freq: Once | INTRAMUSCULAR | Status: AC
  Administered 2016-09-21: 500 ug via SUBCUTANEOUS
  Filled 2016-09-21: qty 1

## 2016-09-21 MED ORDER — PALONOSETRON HCL INJECTION 0.25 MG/5ML
0.2500 mg | Freq: Once | INTRAVENOUS | Status: AC
  Administered 2016-09-21: 0.25 mg via INTRAVENOUS

## 2016-09-21 MED ORDER — FAMOTIDINE IN NACL 20-0.9 MG/50ML-% IV SOLN
INTRAVENOUS | Status: AC
  Filled 2016-09-21: qty 50

## 2016-09-21 MED ORDER — SODIUM CHLORIDE 0.9 % IV SOLN
153.4000 mg | Freq: Once | INTRAVENOUS | Status: AC
  Administered 2016-09-21: 150 mg via INTRAVENOUS
  Filled 2016-09-21: qty 15

## 2016-09-21 MED ORDER — DIPHENHYDRAMINE HCL 50 MG/ML IJ SOLN
INTRAMUSCULAR | Status: AC
  Filled 2016-09-21: qty 1

## 2016-09-21 MED ORDER — FAMOTIDINE IN NACL 20-0.9 MG/50ML-% IV SOLN
20.0000 mg | Freq: Once | INTRAVENOUS | Status: AC
  Administered 2016-09-21: 20 mg via INTRAVENOUS

## 2016-09-21 MED ORDER — SODIUM CHLORIDE 0.9% FLUSH
10.0000 mL | INTRAVENOUS | Status: DC | PRN
  Administered 2016-09-21: 10 mL
  Filled 2016-09-21: qty 10

## 2016-09-21 MED ORDER — HEPARIN SOD (PORK) LOCK FLUSH 100 UNIT/ML IV SOLN
500.0000 [IU] | Freq: Once | INTRAVENOUS | Status: AC | PRN
  Administered 2016-09-21: 500 [IU]
  Filled 2016-09-21: qty 5

## 2016-09-21 MED ORDER — DIPHENHYDRAMINE HCL 50 MG/ML IJ SOLN
50.0000 mg | Freq: Once | INTRAMUSCULAR | Status: AC
Start: 2016-09-21 — End: 2016-09-21
  Administered 2016-09-21: 50 mg via INTRAVENOUS

## 2016-09-21 MED ORDER — PACLITAXEL CHEMO INJECTION 300 MG/50ML
45.0000 mg/m2 | Freq: Once | INTRAVENOUS | Status: AC
  Administered 2016-09-21: 72 mg via INTRAVENOUS
  Filled 2016-09-21: qty 12

## 2016-09-21 NOTE — Progress Notes (Signed)
Nutrition follow-up completed with patient during chemotherapy for metastatic lung cancer.  He is now a patient of Dr. Alvy Bimler.  Past medical history includes seizures, laryngeal cancer with radiation therapy, and anxiety.  Medications include Zofran, prednisone, Compazine, and vitamin D.  Labs were reviewed.  Height: 6 feet 0 inches. Weight: 113.1 pounds. Usual body weight: 115 pounds. BMI: 15.34.  Patient's treatment is palliative. He reports he is doing fine and is eating 3 meals daily and drinking one Ensure Plus. Patient denies nutrition impact symptoms. He would enjoy having coupons for oral nutrition supplements.  Nutrition diagnosis:  Underweight related to metastatic cancer and inadequate oral intake as evidenced by BMI of 15.34.  Intervention: Educated patient to continue strategies for high-calorie, high-protein meals and snacks. Recommended patient increase oral nutrition supplements twice a day between meals.  I provided coupons. Provided fact sheet on ways to increase calories and protein. Questions were answered.  Teach back method used.  I gave patient my contact information.  Monitoring, evaluation, goals: Patient will tolerate adequate calories and protein to minimize weight loss throughout treatment.  Next visit: Wednesday, June 6 during infusion.  **Disclaimer: This note was dictated with voice recognition software. Similar sounding words can inadvertently be transcribed and this note may contain transcription errors which may not have been corrected upon publication of note.**

## 2016-09-21 NOTE — Progress Notes (Signed)
[place] OFFICE PROGRESS NOTE  Patient Care Team: [name], MD as PCP - General (Family Medicine) [name], MD as Consulting Physician (Hematology and Oncology)  SUMMARY OF ONCOLOGIC HISTORY: Oncology History   MDS, R-IPSS score of 3, low risk (Hg 8.9, +16 chromosome on BM, 2% blast count)   Squamous cell carcinoma of the epiglottis   Primary site: Larynx - Supraglottis (Left)   Staging method: AJCC 7th Edition   Clinical: Stage I (T1, N0, M0) signed by [name], MD on 04/30/2013 12:49 PM   Pathologic: Stage I (T1, N0, cM0) signed by [name], MD on 04/30/2013 12:49 PM   Summary: Stage I (T1, N0, cM0)       MDS (myelodysplastic syndrome), low grade ([place])   02/01/2007 Bone Marrow Biopsy    BM biopsy was abnormal, overall probable low grade MDS      03/23/2011 - 02/06/2013 Chemotherapy    [name], discontinued due to diagnosis of laryngeal ca      09/06/2013 Bone Marrow Biopsy    Repeat bone marrow aspirate and biopsy confirmed low-grade myelodysplastic syndrome.      09/17/2013 -  Chemotherapy    The patient resumed darbepoetin injections to treat the anemia.       History of head and neck cancer   03/06/2013 Procedure    Biopsy from epiglottic region showed invasive [facility]      04/25/2013 Imaging    Ct scan showed no other involvement in the LN      05/13/2013 - 06/28/2013 Radiation Therapy    He received radiation therapy, 70 gray in 35 fractions to the larynx      06/09/2016 Imaging    CT scan of the chest showed Bilateral spiculated soft tissue pulmonary masses, highly suspicious for multifocal malignancy. Borderline enlarged left-sided mediastinal lymphadenopathy. Background of severe emphysematous changes, chronic cylindrical bronchiectasis and hyperinflation of the lungs. Findings consistent with longstanding COPD. Diffusely thickened interstitial markings. This may represent chronic interstitial lung changes, however  lymphangitic spread of malignancy cannot be excluded. Anterior compression deformities of several of the thoracic vertebral bodies, with heterogeneous appearance of T7 and T9 vertebral body. This may represent degenerative changes, however osseous metastatic disease cannot be excluded.       07/11/2016 PET scan    There are 2 nodules in the left upper lobe and 2 nodules within the right lower lobe which have spiculated margin and exhibit intense radiotracer uptake compatible with multifocal pulmonary metastasis versus multiple synchronous primary pulmonary neoplasms. 2. Emphysema 3. Aortic atherosclerosis and multi vessel coronary artery calcification.       Cancer of upper lobe of left lung ([place])   06/09/2016 Imaging    CT chest: Bilateral spiculated soft tissue pulmonary masses, highly suspicious for multifocal malignancy. Borderline enlarged left-sided mediastinal lymphadenopathy. Background of severe emphysematous changes, chronic cylindrical bronchiectasis and hyperinflation of the lungs. Findings consistent with longstanding COPD. Diffusely thickened interstitial markings. This may represent chronic interstitial lung changes, however lymphangitic spread of malignancy cannot be excluded. Anterior compression deformities of several of the thoracic vertebral bodies, with heterogeneous appearance of T7 and T9 vertebral body. This may represent degenerative changes, however osseous metastatic disease cannot be excluded.       06/29/2016 Imaging    MRI brain: No evidence of metastatic disease or recent infarction. Advanced generalized brain atrophy with moderate to marked chronic small-vessel ischemic changes throughout. FLAIR imaging appears to show scattered gyri showing FLAIR hyperintensity. These abnormalities are not  confirmed with restricted diffusion or contrast enhancement. Therefore, we are not certain that this is not artifactual. The differential diagnosis does include true pathology such  as posterior reversible encephalopathy, viral or prion disease, postictal change in post chemotherapy change. If there is concern about active CNS pathology, re- scanning in 4-6 weeks could be useful.      07/27/2016 Imaging    CT chest: Bilateral pulmonary lesions. These are suspicious for metastatic disease. Lesions have minimally changed but there may be slight enlargement of the cavitary lesion in the right lower lobe. Severe emphysematous changes.       08/10/2016 Pathology Results    1. Lung, biopsy, RUL, (apical segment) - SCANT BENIGN LUNG PARENCHYMA. - THERE IS NO EVIDENCE OF MALIGNANCY. 2. Lung, biopsy, LUL lingula - SQUAMOUS CELL CARCINOMA. - SEE COMMENT. 3. Lung, biopsy, LUL - ADENOCARCINOMA. - SEE COMMENT. Microscopic Comment 2. The malignant cells are positive for cytokeratin 56 and p63. They are negative for TTF-1. The findings are consistent with squamous cell carcinoma. There is likely insufficient tissue remaining for additional studies, if requested. 3. The malignant cells are positive for TTF-1 and negative for p63 and cytokeratin 5/6. The findings are consistent with adenocarcinoma.      08/10/2016 Procedure    The targets were defined as follows: Left upper lobe nodule, target 1 Lingular nodule, target 2 Proximal right lower lobe nodule, target 3 More distal right lower lobe, nodule  The extendable working channel was secured into place and the locator guide was withdrawn. Under fluoroscopic guidance transbronchial needle brushings, transbronchial Wang needle biopsies, and transbronchial forceps biopsies were performed in the LUL (target 1) and the lingula (target 2) to be sent for cytology and pathology. A bronchioalveolar lavage was performed in the lingula in the vicinity of target 2  and sent for cytology and microbiology (bacterial, fungal, AFB smears and cultures).  Fiducial markers were then placed around target 1, target 2, and in the right lower lobe in the  vicinity of both target 3 and target 4 to be used for possible radiation therapy should this be indicated. At the end of the procedure a general airway inspection was performed and there was no evidence of active bleeding. The bronchoscope was removed.  The patient tolerated the procedure well. There was no significant blood loss and there were no obvious complications. A post-procedural chest x-ray is pending.  Samples: 1. Transbronchial needle brushings from targets 1 and 2 2. Transbronchial Wang needle biopsies from targets 1 and 2 3. Transbronchial forceps biopsies from targets 1 and 2 4. Bronchoalveolar lavage from lingula, target 2 5. Endobronchial biopsies from RUL apical segment 6. Endobronchial brushings from right upper lobe apical segment       08/10/2016 Genetic Testing    Patient has genetic testing done for [employer] and PD-L1 testing Results revealed PD-L1 testing is negative 0%      08/31/2016 Procedure    Technically successful right IJ power-injectable port catheter placement. Ready for routine use       INTERVAL HISTORY: Please see below for problem oriented charting. He returns with his wife. He feels well. Denies recent infection. He is still smoking.  He is attempting to quit smoking.  Denies recent hemoptysis. The patient denies any recent signs or symptoms of bleeding such as spontaneous epistaxis, hematuria or hematochezia.   REVIEW OF SYSTEMS:   Constitutional: Denies fevers, chills or abnormal weight loss Eyes: Denies blurriness of vision Ears, nose, mouth, throat, and face: Denies  mucositis or sore throat Respiratory: Denies cough, dyspnea or wheezes Cardiovascular: Denies palpitation, chest discomfort or lower extremity swelling Gastrointestinal:  Denies nausea, heartburn or change in bowel habits Skin: Denies abnormal skin rashes Lymphatics: Denies new lymphadenopathy or easy bruising Neurological:Denies numbness, tingling or new  weaknesses Behavioral/Psych: Mood is stable, no new changes  All other systems were reviewed with the patient and are negative.  I have reviewed the past medical history, past surgical history, social history and family history with the patient and they are unchanged from previous note.  ALLERGIES:  is allergic to no known allergies.  MEDICATIONS:  Current Outpatient Prescriptions  Medication Sig Dispense Refill  . cyanocobalamin 500 MCG tablet Take 500 mcg by mouth daily.    . ferrous sulfate 325 (65 FE) MG tablet Take 325 mg by mouth daily with breakfast.    . folic acid (FOLVITE) 1 MG tablet Take 1 mg by mouth daily.    Marland Kitchen levETIRAcetam (KEPPRA) 500 MG tablet Take 1 tablet (500 mg total) by mouth 2 (two) times daily. 60 tablet 11  . lidocaine-prilocaine (EMLA) cream Apply to affected area once 30 g 3  . mometasone (NASONEX) 50 MCG/ACT nasal spray Place 2 sprays into both nostrils daily.    . vitamin E 1000 UNIT capsule Take 1,000 Units by mouth daily.     . budesonide-formoterol (SYMBICORT) 80-4.5 MCG/ACT inhaler Inhale 2 puffs into the lungs 2 (two) times daily as needed (Short of breath).    . diazepam (VALIUM) 5 MG tablet Take 5 mg by mouth every 6 (six) hours as needed for anxiety.     . ondansetron (ZOFRAN) 8 MG tablet Take 1 tablet (8 mg total) by mouth 2 (two) times daily as needed for refractory nausea / vomiting. Start on day 3 after chemo. (Patient not taking: Reported on 09/21/2016) 30 tablet 1  . prochlorperazine (COMPAZINE) 10 MG tablet Take 1 tablet (10 mg total) by mouth every 6 (six) hours as needed (Nausea or vomiting). (Patient not taking: Reported on 09/21/2016) 30 tablet 1   No current facility-administered medications for this visit.     PHYSICAL EXAMINATION: ECOG PERFORMANCE STATUS: 0 - Asymptomatic  Vitals:   09/21/16 0856  BP: (!) 114/52  Pulse: 86  Resp: 18  Temp: 97.6 F (36.4 C)   Filed Weights   09/21/16 0856  Weight: 113 lb 1.6 oz (51.3 kg)     GENERAL:alert, no distress and comfortable SKIN: skin color, texture, turgor are normal, no rashes or significant lesions EYES: normal, Conjunctiva are pink and non-injected, sclera clear OROPHARYNX:no exudate, no erythema and lips, buccal mucosa, and tongue normal  NECK: supple, thyroid normal size, non-tender, without nodularity LYMPH:  no palpable lymphadenopathy in the cervical, axillary or inguinal LUNGS: clear to auscultation and percussion with normal breathing effort HEART: regular rate & rhythm and no murmurs and no lower extremity edema ABDOMEN:abdomen soft, non-tender and normal bowel sounds Musculoskeletal:no cyanosis of digits and no clubbing  NEURO: alert & oriented x 3 with fluent speech, no focal motor/sensory deficits  LABORATORY DATA:  I have reviewed the data as listed    Component Value Date/Time   NA 140 09/21/2016 0822   K 4.1 09/21/2016 0822   CL 108 08/31/2016 1155   CL 107 09/19/2012 1325   CO2 20 (L) 09/21/2016 0822   GLUCOSE 116 09/21/2016 0822   GLUCOSE 97 09/19/2012 1325   BUN 21.8 09/21/2016 0822   CREATININE 1.0 09/21/2016 0822   CALCIUM 9.1 09/21/2016 5631  PROT 8.5 (H) 09/21/2016 0822   ALBUMIN 3.3 (L) 09/21/2016 0822   AST 10 09/21/2016 0822   ALT <6 09/21/2016 0822   ALKPHOS 91 09/21/2016 0822   BILITOT 0.32 09/21/2016 0822   GFRNONAA >60 08/31/2016 1155   GFRAA >60 08/31/2016 1155    No results found for: SPEP, UPEP  Lab Results  Component Value Date   WBC 2.8 (L) 09/21/2016   NEUTROABS 1.6 09/21/2016   HGB 8.5 (L) 09/21/2016   HCT 26.7 (L) 09/21/2016   MCV 117.6 (H) 09/21/2016   PLT 294 09/21/2016      Chemistry      Component Value Date/Time   NA 140 09/21/2016 0822   K 4.1 09/21/2016 0822   CL 108 08/31/2016 1155   CL 107 09/19/2012 1325   CO2 20 (L) 09/21/2016 0822   BUN 21.8 09/21/2016 0822   CREATININE 1.0 09/21/2016 0822      Component Value Date/Time   CALCIUM 9.1 09/21/2016 0822   ALKPHOS 91 09/21/2016  0822   AST 10 09/21/2016 0822   ALT <6 09/21/2016 0822   BILITOT 0.32 09/21/2016 3875       RADIOGRAPHIC STUDIES: I have personally reviewed the radiological images as listed and agreed with the findings in the report. Ir US Guide Vasc Access Right  Result Date: 08/31/2016 CLINICAL DATA:  History of laryngeal carcinoma. Bilateral pulmonary nodules. Needs long-term durable venous access for chemotherapy regimen. EXAM: TUNNELED PORT CATHETER PLACEMENT WITH ULTRASOUND AND FLUOROSCOPIC GUIDANCE FLUOROSCOPY TIME:  0.1 minute (9 uGym2 DAP) ANESTHESIA/SEDATION: Intravenous Fentanyl and Versed were administered as conscious sedation during continuous monitoring of the patient's level of consciousness and physiological / cardiorespiratory status by the radiology RN, with a total moderate sedation time of 20 minutes. TECHNIQUE: The procedure, risks, benefits, and alternatives were explained to the patient. Questions regarding the procedure were encouraged and answered. The patient understands and consents to the procedure. As antibiotic prophylaxis, cefazolin 2 g was ordered pre-procedure and administered intravenously within one hour of incision. Patency of the right IJ vein was confirmed with ultrasound with image documentation. An appropriate skin site was determined. Skin site was marked. Region was prepped using maximum barrier technique including cap and mask, sterile gown, sterile gloves, large sterile sheet, and Chlorhexidine as cutaneous antisepsis. The region was infiltrated locally with 1% lidocaine. Under real-time ultrasound guidance, the right IJ vein was accessed with a 21 gauge micropuncture needle; the needle tip within the vein was confirmed with ultrasound image documentation. Needle was exchanged over a 018 guidewire for transitional dilator which allowed passage of the [facility] wire into the IVC. Over this, the transitional dilator was exchanged for a 5 [place] MPA catheter. A small incision was  made on the right anterior chest wall and a subcutaneous pocket fashioned. The power-injectable port was positioned and its catheter tunneled to the right IJ dermatotomy site. The MPA catheter was exchanged over an Amplatz wire for a peel-away sheath, through which the port catheter, which had been trimmed to the appropriate length, was advanced and positioned under fluoroscopy with its tip at the cavoatrial junction. Spot chest radiograph confirms good catheter position and no pneumothorax. The pocket was closed with deep interrupted and subcuticular continuous 3-0 Monocryl sutures. The port was flushed per protocol. The incisions were covered with Dermabond then covered with a sterile dressing. COMPLICATIONS: COMPLICATIONS None immediate IMPRESSION: Technically successful right IJ power-injectable port catheter placement. Ready for routine use. Electronically Signed   By: Eden Emms.D.  On: 08/31/2016 14:40   Ir Fluoro Guide Port Insertion Right  Result Date: 08/31/2016 CLINICAL DATA:  History of laryngeal carcinoma. Bilateral pulmonary nodules. Needs long-term durable venous access for chemotherapy regimen. EXAM: TUNNELED PORT CATHETER PLACEMENT WITH ULTRASOUND AND FLUOROSCOPIC GUIDANCE FLUOROSCOPY TIME:  0.1 minute (9 uGym2 DAP) ANESTHESIA/SEDATION: Intravenous Fentanyl and Versed were administered as conscious sedation during continuous monitoring of the patient's level of consciousness and physiological / cardiorespiratory status by the radiology RN, with a total moderate sedation time of 20 minutes. TECHNIQUE: The procedure, risks, benefits, and alternatives were explained to the patient. Questions regarding the procedure were encouraged and answered. The patient understands and consents to the procedure. As antibiotic prophylaxis, cefazolin 2 g was ordered pre-procedure and administered intravenously within one hour of incision. Patency of the right IJ vein was confirmed with ultrasound with image  documentation. An appropriate skin site was determined. Skin site was marked. Region was prepped using maximum barrier technique including cap and mask, sterile gown, sterile gloves, large sterile sheet, and Chlorhexidine as cutaneous antisepsis. The region was infiltrated locally with 1% lidocaine. Under real-time ultrasound guidance, the right IJ vein was accessed with a 21 gauge micropuncture needle; the needle tip within the vein was confirmed with ultrasound image documentation. Needle was exchanged over a 018 guidewire for transitional dilator which allowed passage of the [facility] wire into the IVC. Over this, the transitional dilator was exchanged for a 5 [place] MPA catheter. A small incision was made on the right anterior chest wall and a subcutaneous pocket fashioned. The power-injectable port was positioned and its catheter tunneled to the right IJ dermatotomy site. The MPA catheter was exchanged over an Amplatz wire for a peel-away sheath, through which the port catheter, which had been trimmed to the appropriate length, was advanced and positioned under fluoroscopy with its tip at the cavoatrial junction. Spot chest radiograph confirms good catheter position and no pneumothorax. The pocket was closed with deep interrupted and subcuticular continuous 3-0 Monocryl sutures. The port was flushed per protocol. The incisions were covered with Dermabond then covered with a sterile dressing. COMPLICATIONS: COMPLICATIONS None immediate IMPRESSION: Technically successful right IJ power-injectable port catheter placement. Ready for routine use. Electronically Signed   By: Lucrezia Europe M.D.   On: 08/31/2016 14:40    ASSESSMENT & PLAN:  Cancer of upper lobe of left lung ([place]) I reviewed the guidelines with the patient and his wife. The patient has stage IV disease, treatment is noncurable due to bilateral lung involvement. The patient has almost stopped smoking. He will proceed with treatment today with  combination treatment with carboplatin and Taxol, to be given on days 1, 8 and rest day 15 for cycle of every 21 days He will get Neulasta injection after day 8 of treatment.   Malignant cachexia ([place]) The patient has poor appetite.  He is not able to gain weight. We discussed appetite stimulant with low-dose prednisone.  He agreed to try. I have appointment for him to see dietitian today  Tobacco abuse I spent some time counseling the patient the importance of tobacco cessation. he is currently attempting to quit on his own He set a quit date for tomorrow.  MDS (myelodysplastic syndrome), low grade The anemia from MDS is stable. I suspect he may have some degree of bone marrow suppression from his antiseizure medications. He had responded well to Darbopeitin in the past with improvement in energy level.  The patient will get regular blood work done. The patient will  continue on Aranep injection to keep hemoglobin greater than 10 g. He will continue to get darbepoetin injection while on treatment, scheduled to be given every 2 weeks.   No orders of the defined types were placed in this encounter.  All questions were answered. The patient knows to call the clinic with any problems, questions or concerns. No barriers to learning was detected. I spent 20 minutes counseling the patient face to face. The total time spent in the appointment was 25 minutes and more than 50% was on counseling and review of test results     [name], MD 09/21/2016 9:17 AM

## 2016-09-21 NOTE — Assessment & Plan Note (Signed)
The anemia from MDS is stable. I suspect he may have some degree of bone marrow suppression from his antiseizure medications. He had responded well to Darbopeitin in the past with improvement in energy level.  The patient will get regular blood work done. The patient will continue on Aranep injection to keep hemoglobin greater than 10 g. He will continue to get darbepoetin injection while on treatment, scheduled to be given every 2 weeks.

## 2016-09-21 NOTE — Patient Instructions (Addendum)
[facility] Discharge Instructions for Patients Receiving Chemotherapy  Today you received the following chemotherapy agents: Taxol and Carboplatin.  To help prevent nausea and vomiting after your treatment, we encourage you to take your nausea medication as directed.   If you develop nausea and vomiting that is not controlled by your nausea medication, call the clinic.   BELOW ARE SYMPTOMS THAT SHOULD BE REPORTED IMMEDIATELY:  *FEVER GREATER THAN 100.5 F  *CHILLS WITH OR WITHOUT FEVER  NAUSEA AND VOMITING THAT IS NOT CONTROLLED WITH YOUR NAUSEA MEDICATION  *UNUSUAL SHORTNESS OF BREATH  *UNUSUAL BRUISING OR BLEEDING  TENDERNESS IN MOUTH AND THROAT WITH OR WITHOUT PRESENCE OF ULCERS  *URINARY PROBLEMS  *BOWEL PROBLEMS  UNUSUAL RASH Items with * indicate a potential emergency and should be followed up as soon as possible.  Feel free to call the clinic you have any questions or concerns. The clinic phone number is [phone].  Please show the [facility] at check-in to the Emergency Department and triage nurse.  Paclitaxel injection What is this medicine? PACLITAXEL (PAK li TAX el) is a chemotherapy drug. It targets fast dividing cells, like cancer cells, and causes these cells to die. This medicine is used to treat ovarian cancer, breast cancer, and other cancers. This medicine may be used for other purposes; ask your health care provider or pharmacist if you have questions. COMMON BRAND NAME(S): Onxol, Taxol What should I tell my health care provider before I take this medicine? They need to know if you have any of these conditions: -blood disorders -irregular heartbeat -infection (especially a virus infection such as chickenpox, cold sores, or herpes) -liver disease -previous or ongoing radiation therapy -an unusual or allergic reaction to paclitaxel, alcohol, polyoxyethylated castor oil, other chemotherapy agents, other medicines, foods, dyes, or  preservatives -pregnant or trying to get pregnant -breast-feeding How should I use this medicine? This drug is given as an infusion into a vein. It is administered in a hospital or clinic by a specially trained health care professional. Talk to your pediatrician regarding the use of this medicine in children. Special care may be needed. Overdosage: If you think you have taken too much of this medicine contact a poison control center or emergency room at once. NOTE: This medicine is only for you. Do not share this medicine with others. What if I miss a dose? It is important not to miss your dose. Call your doctor or health care professional if you are unable to keep an appointment. What may interact with this medicine? Do not take this medicine with any of the following medications: -disulfiram -metronidazole This medicine may also interact with the following medications: -cyclosporine -diazepam -ketoconazole -medicines to increase blood counts like filgrastim, pegfilgrastim, sargramostim -other chemotherapy drugs like cisplatin, doxorubicin, epirubicin, etoposide, teniposide, vincristine -quinidine -testosterone -vaccines -verapamil Talk to your doctor or health care professional before taking any of these medicines: -acetaminophen -aspirin -ibuprofen -ketoprofen -naproxen This list may not describe all possible interactions. Give your health care provider a list of all the medicines, herbs, non-prescription drugs, or dietary supplements you use. Also tell them if you smoke, drink alcohol, or use illegal drugs. Some items may interact with your medicine. What should I watch for while using this medicine? Your condition will be monitored carefully while you are receiving this medicine. You will need important blood work done while you are taking this medicine. This medicine can cause serious allergic reactions. To reduce your risk you will need to  take other medicine(s) before  treatment with this medicine. If you experience allergic reactions like skin rash, itching or hives, swelling of the face, lips, or tongue, tell your doctor or health care professional right away. In some cases, you may be given additional medicines to help with side effects. Follow all directions for their use. This drug may make you feel generally unwell. This is not uncommon, as chemotherapy can affect healthy cells as well as cancer cells. Report any side effects. Continue your course of treatment even though you feel ill unless your doctor tells you to stop. Call your doctor or health care professional for advice if you get a fever, chills or sore throat, or other symptoms of a cold or flu. Do not treat yourself. This drug decreases your body's ability to fight infections. Try to avoid being around people who are sick. This medicine may increase your risk to bruise or bleed. Call your doctor or health care professional if you notice any unusual bleeding. Be careful brushing and flossing your teeth or using a toothpick because you may get an infection or bleed more easily. If you have any dental work done, tell your dentist you are receiving this medicine. Avoid taking products that contain aspirin, acetaminophen, ibuprofen, naproxen, or ketoprofen unless instructed by your doctor. These medicines may hide a fever. Do not become pregnant while taking this medicine. Women should inform their doctor if they wish to become pregnant or think they might be pregnant. There is a potential for serious side effects to an unborn child. Talk to your health care professional or pharmacist for more information. Do not breast-feed an infant while taking this medicine. Men are advised not to father a child while receiving this medicine. This product may contain alcohol. Ask your pharmacist or healthcare provider if this medicine contains alcohol. Be sure to tell all healthcare providers you are taking this medicine.  Certain medicines, like metronidazole and disulfiram, can cause an unpleasant reaction when taken with alcohol. The reaction includes flushing, headache, nausea, vomiting, sweating, and increased thirst. The reaction can last from 30 minutes to several hours. What side effects may I notice from receiving this medicine? Side effects that you should report to your doctor or health care professional as soon as possible: -allergic reactions like skin rash, itching or hives, swelling of the face, lips, or tongue -low blood counts - This drug may decrease the number of white blood cells, red blood cells and platelets. You may be at increased risk for infections and bleeding. -signs of infection - fever or chills, cough, sore throat, pain or difficulty passing urine -signs of decreased platelets or bleeding - bruising, pinpoint red spots on the skin, black, tarry stools, nosebleeds -signs of decreased red blood cells - unusually weak or tired, fainting spells, lightheadedness -breathing problems -chest pain -high or low blood pressure -mouth sores -nausea and vomiting -pain, swelling, redness or irritation at the injection site -pain, tingling, numbness in the hands or feet -slow or irregular heartbeat -swelling of the ankle, feet, hands Side effects that usually do not require medical attention (report to your doctor or health care professional if they continue or are bothersome): -bone pain -complete hair loss including hair on your head, underarms, pubic hair, eyebrows, and eyelashes -changes in the color of fingernails -diarrhea -loosening of the fingernails -loss of appetite -muscle or joint pain -red flush to skin -sweating This list may not describe all possible side effects. Call your doctor for medical advice about   side effects. You may report side effects to FDA at 1-800-FDA-1088. Where should I keep my medicine? This drug is given in a hospital or clinic and will not be stored at  home. NOTE: This sheet is a summary. It may not cover all possible information. If you have questions about this medicine, talk to your doctor, pharmacist, or health care provider.  2018 Elsevier/Gold Standard (2015-02-24 19:58:00) Carboplatin injection What is this medicine? CARBOPLATIN (KAR boe pla tin) is a chemotherapy drug. It targets fast dividing cells, like cancer cells, and causes these cells to die. This medicine is used to treat ovarian cancer and many other cancers. This medicine may be used for other purposes; ask your health care provider or pharmacist if you have questions. COMMON BRAND NAME(S): Paraplatin What should I tell my health care provider before I take this medicine? They need to know if you have any of these conditions: -blood disorders -hearing problems -kidney disease -recent or ongoing radiation therapy -an unusual or allergic reaction to carboplatin, cisplatin, other chemotherapy, other medicines, foods, dyes, or preservatives -pregnant or trying to get pregnant -breast-feeding How should I use this medicine? This drug is usually given as an infusion into a vein. It is administered in a hospital or clinic by a specially trained health care professional. Talk to your pediatrician regarding the use of this medicine in children. Special care may be needed. Overdosage: If you think you have taken too much of this medicine contact a poison control center or emergency room at once. NOTE: This medicine is only for you. Do not share this medicine with others. What if I miss a dose? It is important not to miss a dose. Call your doctor or health care professional if you are unable to keep an appointment. What may interact with this medicine? -medicines for seizures -medicines to increase blood counts like filgrastim, pegfilgrastim, sargramostim -some antibiotics like amikacin, gentamicin, neomycin, streptomycin, tobramycin -vaccines Talk to your doctor or health care  professional before taking any of these medicines: -acetaminophen -aspirin -ibuprofen -ketoprofen -naproxen This list may not describe all possible interactions. Give your health care provider a list of all the medicines, herbs, non-prescription drugs, or dietary supplements you use. Also tell them if you smoke, drink alcohol, or use illegal drugs. Some items may interact with your medicine. What should I watch for while using this medicine? Your condition will be monitored carefully while you are receiving this medicine. You will need important blood work done while you are taking this medicine. This drug may make you feel generally unwell. This is not uncommon, as chemotherapy can affect healthy cells as well as cancer cells. Report any side effects. Continue your course of treatment even though you feel ill unless your doctor tells you to stop. In some cases, you may be given additional medicines to help with side effects. Follow all directions for their use. Call your doctor or health care professional for advice if you get a fever, chills or sore throat, or other symptoms of a cold or flu. Do not treat yourself. This drug decreases your body's ability to fight infections. Try to avoid being around people who are sick. This medicine may increase your risk to bruise or bleed. Call your doctor or health care professional if you notice any unusual bleeding. Be careful brushing and flossing your teeth or using a toothpick because you may get an infection or bleed more easily. If you have any dental work done, tell your dentist you are  receiving this medicine. Avoid taking products that contain aspirin, acetaminophen, ibuprofen, naproxen, or ketoprofen unless instructed by your doctor. These medicines may hide a fever. Do not become pregnant while taking this medicine. Women should inform their doctor if they wish to become pregnant or think they might be pregnant. There is a potential for serious side  effects to an unborn child. Talk to your health care professional or pharmacist for more information. Do not breast-feed an infant while taking this medicine. What side effects may I notice from receiving this medicine? Side effects that you should report to your doctor or health care professional as soon as possible: -allergic reactions like skin rash, itching or hives, swelling of the face, lips, or tongue -signs of infection - fever or chills, cough, sore throat, pain or difficulty passing urine -signs of decreased platelets or bleeding - bruising, pinpoint red spots on the skin, black, tarry stools, nosebleeds -signs of decreased red blood cells - unusually weak or tired, fainting spells, lightheadedness -breathing problems -changes in hearing -changes in vision -chest pain -high blood pressure -low blood counts - This drug may decrease the number of white blood cells, red blood cells and platelets. You may be at increased risk for infections and bleeding. -nausea and vomiting -pain, swelling, redness or irritation at the injection site -pain, tingling, numbness in the hands or feet -problems with balance, talking, walking -trouble passing urine or change in the amount of urine Side effects that usually do not require medical attention (report to your doctor or health care professional if they continue or are bothersome): -hair loss -loss of appetite -metallic taste in the mouth or changes in taste This list may not describe all possible side effects. Call your doctor for medical advice about side effects. You may report side effects to FDA at 1-800-FDA-1088. Where should I keep my medicine? This drug is given in a hospital or clinic and will not be stored at home. NOTE: This sheet is a summary. It may not cover all possible information. If you have questions about this medicine, talk to your doctor, pharmacist, or health care provider.  2018 Elsevier/Gold Standard (2007-07-31  14:38:05) Darbepoetin Alfa injection What is this medicine? DARBEPOETIN ALFA (dar be POE e tin AL fa) helps your body make more red blood cells. It is used to treat anemia caused by chronic kidney failure and chemotherapy. This medicine may be used for other purposes; ask your health care provider or pharmacist if you have questions. COMMON BRAND NAME(S): Aranesp What should I tell my health care provider before I take this medicine? They need to know if you have any of these conditions: -blood clotting disorders or history of blood clots -cancer patient not on chemotherapy -cystic fibrosis -heart disease, such as angina, heart failure, or a history of a heart attack -hemoglobin level of 12 g/dL or greater -high blood pressure -low levels of folate, iron, or vitamin B12 -seizures -an unusual or allergic reaction to darbepoetin, erythropoietin, albumin, hamster proteins, latex, other medicines, foods, dyes, or preservatives -pregnant or trying to get pregnant -breast-feeding How should I use this medicine? This medicine is for injection into a vein or under the skin. It is usually given by a health care professional in a hospital or clinic setting. If you get this medicine at home, you will be taught how to prepare and give this medicine. Use exactly as directed. Take your medicine at regular intervals. Do not take your medicine more often than directed. It is  important that you put your used needles and syringes in a special sharps container. Do not put them in a trash can. If you do not have a sharps container, call your pharmacist or healthcare provider to get one. A special MedGuide will be given to you by the pharmacist with each prescription and refill. Be sure to read this information carefully each time. Talk to your pediatrician regarding the use of this medicine in children. While this medicine may be used in children as young as 1 year for selected conditions, precautions do  apply. Overdosage: If you think you have taken too much of this medicine contact a poison control center or emergency room at once. NOTE: This medicine is only for you. Do not share this medicine with others. What if I miss a dose? If you miss a dose, take it as soon as you can. If it is almost time for your next dose, take only that dose. Do not take double or extra doses. What may interact with this medicine? Do not take this medicine with any of the following medications: -epoetin alfa This list may not describe all possible interactions. Give your health care provider a list of all the medicines, herbs, non-prescription drugs, or dietary supplements you use. Also tell them if you smoke, drink alcohol, or use illegal drugs. Some items may interact with your medicine. What should I watch for while using this medicine? Your condition will be monitored carefully while you are receiving this medicine. You may need blood work done while you are taking this medicine. What side effects may I notice from receiving this medicine? Side effects that you should report to your doctor or health care professional as soon as possible: -allergic reactions like skin rash, itching or hives, swelling of the face, lips, or tongue -breathing problems -changes in vision -chest pain -confusion, trouble speaking or understanding -feeling faint or lightheaded, falls -high blood pressure -muscle aches or pains -pain, swelling, warmth in the leg -rapid weight gain -severe headaches -sudden numbness or weakness of the face, arm or leg -trouble walking, dizziness, loss of balance or coordination -seizures (convulsions) -swelling of the ankles, feet, hands -unusually weak or tired Side effects that usually do not require medical attention (report to your doctor or health care professional if they continue or are bothersome): -diarrhea -fever, chills (flu-like symptoms) -headaches -nausea, vomiting -redness,  stinging, or swelling at site where injected This list may not describe all possible side effects. Call your doctor for medical advice about side effects. You may report side effects to FDA at 1-800-FDA-1088. Where should I keep my medicine? Keep out of the reach of children. Store in a refrigerator between 2 and 8 degrees C (36 and 46 degrees F). Do not freeze. Do not shake. Throw away any unused portion if using a single-dose vial. Throw away any unused medicine after the expiration date. NOTE: This sheet is a summary. It may not cover all possible information. If you have questions about this medicine, talk to your doctor, pharmacist, or health care provider.  2018 Elsevier/Gold Standard (2015-12-14 19:52:26)

## 2016-09-21 NOTE — Assessment & Plan Note (Addendum)
The patient has poor appetite.  He is not able to gain weight. We discussed appetite stimulant with low-dose prednisone.  He agreed to try. I have appointment for him to see dietitian today

## 2016-09-21 NOTE — Assessment & Plan Note (Signed)
I reviewed the guidelines with the patient and his wife. The patient has stage IV disease, treatment is noncurable due to bilateral lung involvement. The patient has almost stopped smoking. He will proceed with treatment today with combination treatment with carboplatin and Taxol, to be given on days 1, 8 and rest day 15 for cycle of every 21 days He will get Neulasta injection after day 8 of treatment.

## 2016-09-21 NOTE — Patient Instructions (Signed)
Implanted Port Home Guide An implanted port is a type of central line that is placed under the skin. Central lines are used to provide IV access when treatment or nutrition needs to be given through a person's veins. Implanted ports are used for long-term IV access. An implanted port may be placed because:  You need IV medicine that would be irritating to the small veins in your hands or arms.  You need long-term IV medicines, such as antibiotics.  You need IV nutrition for a long period.  You need frequent blood draws for lab tests.  You need dialysis.  Implanted ports are usually placed in the chest area, but they can also be placed in the upper arm, the abdomen, or the leg. An implanted port has two main parts:  Reservoir. The reservoir is round and will appear as a small, raised area under your skin. The reservoir is the part where a needle is inserted to give medicines or draw blood.  Catheter. The catheter is a thin, flexible tube that extends from the reservoir. The catheter is placed into a large vein. Medicine that is inserted into the reservoir goes into the catheter and then into the vein.  How will I care for my incision site? Do not get the incision site wet. Bathe or shower as directed by your health care provider. How is my port accessed? Special steps must be taken to access the port:  Before the port is accessed, a numbing cream can be placed on the skin. This helps numb the skin over the port site.  Your health care provider uses a sterile technique to access the port. ? Your health care provider must put on a mask and sterile gloves. ? The skin over your port is cleaned carefully with an antiseptic and allowed to dry. ? The port is gently pinched between sterile gloves, and a needle is inserted into the port.  Only "non-coring" port needles should be used to access the port. Once the port is accessed, a blood return should be checked. This helps ensure that the port  is in the vein and is not clogged.  If your port needs to remain accessed for a constant infusion, a clear (transparent) bandage will be placed over the needle site. The bandage and needle will need to be changed every week, or as directed by your health care provider.  Keep the bandage covering the needle clean and dry. Do not get it wet. Follow your health care provider's instructions on how to take a shower or bath while the port is accessed.  If your port does not need to stay accessed, no bandage is needed over the port.  What is flushing? Flushing helps keep the port from getting clogged. Follow your health care provider's instructions on how and when to flush the port. Ports are usually flushed with saline solution or a medicine called heparin. The need for flushing will depend on how the port is used.  If the port is used for intermittent medicines or blood draws, the port will need to be flushed: ? After medicines have been given. ? After blood has been drawn. ? As part of routine maintenance.  If a constant infusion is running, the port may not need to be flushed.  How long will my port stay implanted? The port can stay in for as long as your health care provider thinks it is needed. When it is time for the port to come out, surgery will be   done to remove it. The procedure is similar to the one performed when the port was put in. When should I seek immediate medical care? When you have an implanted port, you should seek immediate medical care if:  You notice a bad smell coming from the incision site.  You have swelling, redness, or drainage at the incision site.  You have more swelling or pain at the port site or the surrounding area.  You have a fever that is not controlled with medicine.  This information is not intended to replace advice given to you by your health care provider. Make sure you discuss any questions you have with your health care provider. Document  Released: 04/25/2005 Document Revised: 10/01/2015 Document Reviewed: 12/31/2012 Elsevier Interactive Patient Education  2017 [employer]  

## 2016-09-21 NOTE — Assessment & Plan Note (Signed)
I spent some time counseling the patient the importance of tobacco cessation. he is currently attempting to quit on his own He set a quit date for tomorrow.

## 2016-09-22 ENCOUNTER — Telehealth: Payer: Self-pay

## 2016-09-22 LAB — ACID FAST CULTURE WITH REFLEXED SENSITIVITIES (MYCOBACTERIA): Acid Fast Culture: NEGATIVE

## 2016-09-22 LAB — ACID FAST CULTURE WITH REFLEXED SENSITIVITIES

## 2016-09-22 NOTE — Telephone Encounter (Signed)
Called and left message to call for any questions or concerns.

## 2016-09-22 NOTE — Telephone Encounter (Signed)
-----   Message from Flo Shanks, RN sent at 09/21/2016  3:30 PM EDT ----- Regarding: Dr. Alvy Bimler First time Chemo First time Taxol and Carboplatin. Patient tolerated treatment well.

## 2016-09-28 ENCOUNTER — Ambulatory Visit (HOSPITAL_COMMUNITY)
Admission: RE | Admit: 2016-09-28 | Discharge: 2016-09-28 | Disposition: A | Discharge status: Home / Self Care | Payer: Medicare Other | Source: Ambulatory Visit | Attending: Hematology and Oncology | Admitting: Hematology and Oncology

## 2016-09-28 ENCOUNTER — Other Ambulatory Visit (HOSPITAL_BASED_OUTPATIENT_CLINIC_OR_DEPARTMENT_OTHER): Payer: Medicare Other

## 2016-09-28 ENCOUNTER — Other Ambulatory Visit: Payer: Self-pay | Admitting: Hematology and Oncology

## 2016-09-28 ENCOUNTER — Ambulatory Visit (HOSPITAL_BASED_OUTPATIENT_CLINIC_OR_DEPARTMENT_OTHER): Payer: Medicare Other

## 2016-09-28 VITALS — BP 133/90 | HR 66 | Temp 98.0°F | Resp 18

## 2016-09-28 DIAGNOSIS — Z5111 Encounter for antineoplastic chemotherapy: Secondary | ICD-10-CM | POA: Diagnosis not present

## 2016-09-28 DIAGNOSIS — C3412 Malignant neoplasm of upper lobe, left bronchus or lung: Secondary | ICD-10-CM

## 2016-09-28 DIAGNOSIS — D4620 Refractory anemia with excess of blasts, unspecified: Secondary | ICD-10-CM

## 2016-09-28 DIAGNOSIS — C7802 Secondary malignant neoplasm of left lung: Secondary | ICD-10-CM

## 2016-09-28 DIAGNOSIS — C801 Malignant (primary) neoplasm, unspecified: Secondary | ICD-10-CM

## 2016-09-28 LAB — COMPREHENSIVE METABOLIC PANEL
ALT: <6 U/L (ref 0–55)
AST: 10 U/L (ref 5–34)
Albumin: 3.3 g/dL — ABNORMAL LOW (ref 3.5–5.0)
Alkaline Phosphatase: 87 U/L (ref 40–150)
Anion Gap: 10 mEq/L (ref 3–11)
BUN: 27.6 mg/dL — ABNORMAL HIGH (ref 7.0–26.0)
CHLORIDE: 110 meq/L — AB (ref 98–109)
CO2: 20 meq/L — AB (ref 22–29)
Calcium: 9.2 mg/dL (ref 8.4–10.4)
Creatinine: 1 mg/dL (ref 0.7–1.3)
EGFR: 85 mL/min/{1.73_m2} — ABNORMAL LOW (ref >90)
GLUCOSE: 93 mg/dL (ref 70–140)
POTASSIUM: 4.7 meq/L (ref 3.5–5.1)
SODIUM: 139 meq/L (ref 136–145)
Total Bilirubin: 0.32 mg/dL (ref 0.20–1.20)
Total Protein: 8.4 g/dL — ABNORMAL HIGH (ref 6.4–8.3)

## 2016-09-28 LAB — CBC WITH DIFFERENTIAL/PLATELET
BASO%: 1.5 % (ref 0.0–2.0)
BASOS ABS: 0 10*3/uL (ref 0.0–0.1)
EOS ABS: 0 10*3/uL (ref 0.0–0.5)
EOS%: 0.7 % (ref 0.0–7.0)
HCT: 24.5 % — ABNORMAL LOW (ref 38.4–49.9)
HGB: 7.8 g/dL — ABNORMAL LOW (ref 13.0–17.1)
LYMPH%: 25.6 % (ref 14.0–49.0)
MCH: 37.1 pg — AB (ref 27.2–33.4)
MCHC: 31.8 g/dL — AB (ref 32.0–36.0)
MCV: 116.7 fL — AB (ref 79.3–98.0)
MONO#: 0.2 10*3/uL (ref 0.1–0.9)
MONO%: 8.8 % (ref 0.0–14.0)
NEUT%: 63.4 % (ref 39.0–75.0)
NEUTROS ABS: 1.7 10*3/uL (ref 1.5–6.5)
Platelets: 266 10*3/uL (ref 140–400)
RBC: 2.1 10*6/uL — ABNORMAL LOW (ref 4.20–5.82)
RDW: 15.8 % — ABNORMAL HIGH (ref 11.0–14.6)
WBC: 2.7 10*3/uL — AB (ref 4.0–10.3)
lymph#: 0.7 10*3/uL — ABNORMAL LOW (ref 0.9–3.3)

## 2016-09-28 LAB — PREPARE RBC (CROSSMATCH)

## 2016-09-28 MED ORDER — ACETAMINOPHEN 325 MG PO TABS
ORAL_TABLET | ORAL | Status: AC
  Filled 2016-09-28: qty 2

## 2016-09-28 MED ORDER — FAMOTIDINE IN NACL 20-0.9 MG/50ML-% IV SOLN
20.0000 mg | Freq: Once | INTRAVENOUS | Status: AC
  Administered 2016-09-28: 20 mg via INTRAVENOUS

## 2016-09-28 MED ORDER — SODIUM CHLORIDE 0.9 % IV SOLN
20.0000 mg | Freq: Once | INTRAVENOUS | Status: AC
  Administered 2016-09-28: 20 mg via INTRAVENOUS
  Filled 2016-09-28: qty 2

## 2016-09-28 MED ORDER — DIPHENHYDRAMINE HCL 50 MG/ML IJ SOLN
50.0000 mg | Freq: Once | INTRAMUSCULAR | Status: AC
  Administered 2016-09-28: 50 mg via INTRAVENOUS

## 2016-09-28 MED ORDER — DIPHENHYDRAMINE HCL 50 MG/ML IJ SOLN
INTRAMUSCULAR | Status: AC
  Filled 2016-09-28: qty 1

## 2016-09-28 MED ORDER — ACETAMINOPHEN 325 MG PO TABS
650.0000 mg | ORAL_TABLET | Freq: Once | ORAL | Status: AC
  Administered 2016-09-28: 650 mg via ORAL

## 2016-09-28 MED ORDER — SODIUM CHLORIDE 0.9 % IV SOLN: 153.4000 mg | Freq: Once | INTRAVENOUS | Status: DC

## 2016-09-28 MED ORDER — SODIUM CHLORIDE 0.9% FLUSH
10.0000 mL | INTRAVENOUS | Status: DC | PRN
  Administered 2016-09-28: 10 mL
  Filled 2016-09-28: qty 10

## 2016-09-28 MED ORDER — HEPARIN SOD (PORK) LOCK FLUSH 100 UNIT/ML IV SOLN
500.0000 [IU] | Freq: Once | INTRAVENOUS | Status: AC | PRN
  Administered 2016-09-28: 500 [IU]
  Filled 2016-09-28: qty 5

## 2016-09-28 MED ORDER — SODIUM CHLORIDE 0.9 % IV SOLN
150.0000 mg | Freq: Once | INTRAVENOUS | Status: AC
  Administered 2016-09-28: 150 mg via INTRAVENOUS
  Filled 2016-09-28: qty 15

## 2016-09-28 MED ORDER — PALONOSETRON HCL INJECTION 0.25 MG/5ML
0.2500 mg | Freq: Once | INTRAVENOUS | Status: AC
  Administered 2016-09-28: 0.25 mg via INTRAVENOUS

## 2016-09-28 MED ORDER — FAMOTIDINE IN NACL 20-0.9 MG/50ML-% IV SOLN
INTRAVENOUS | Status: AC
  Filled 2016-09-28: qty 50

## 2016-09-28 MED ORDER — SODIUM CHLORIDE 0.9 % IV SOLN
Freq: Once | INTRAVENOUS | Status: AC
  Administered 2016-09-28: 09:00:00 via INTRAVENOUS

## 2016-09-28 MED ORDER — PACLITAXEL CHEMO INJECTION 300 MG/50ML
45.0000 mg/m2 | Freq: Once | INTRAVENOUS | Status: AC
  Administered 2016-09-28: 72 mg via INTRAVENOUS
  Filled 2016-09-28: qty 12

## 2016-09-28 MED ORDER — PALONOSETRON HCL INJECTION 0.25 MG/5ML
INTRAVENOUS | Status: AC
  Filled 2016-09-28: qty 5

## 2016-09-28 NOTE — Patient Instructions (Signed)
[facility] Discharge Instructions for Patients Receiving Chemotherapy  Today you received the following chemotherapy agents Taxol and Carboplatin   To help prevent nausea and vomiting after your treatment, we encourage you to take your nausea medication as directed. No Zofran for the next 3 days. Take Compazine instead.    If you develop nausea and vomiting that is not controlled by your nausea medication, call the clinic.   BELOW ARE SYMPTOMS THAT SHOULD BE REPORTED IMMEDIATELY:  *FEVER GREATER THAN 100.5 F  *CHILLS WITH OR WITHOUT FEVER  NAUSEA AND VOMITING THAT IS NOT CONTROLLED WITH YOUR NAUSEA MEDICATION  *UNUSUAL SHORTNESS OF BREATH  *UNUSUAL BRUISING OR BLEEDING  TENDERNESS IN MOUTH AND THROAT WITH OR WITHOUT PRESENCE OF ULCERS  *URINARY PROBLEMS  *BOWEL PROBLEMS  UNUSUAL RASH Items with * indicate a potential emergency and should be followed up as soon as possible.  Feel free to call the clinic you have any questions or concerns. The clinic phone number is [phone].  Please show the [facility] at check-in to the Emergency Department and triage nurse.   Blood Transfusion, Adult, Care After This sheet gives you information about how to care for yourself after your procedure. Your health care provider may also give you more specific instructions. If you have problems or questions, contact your health care provider. What can I expect after the procedure? After your procedure, it is common to have:  Bruising and soreness where the IV tube was inserted.  Headache. Follow these instructions at home:  Take over-the-counter and prescription medicines only as told by your health care provider.  Return to your normal activities as told by your health care provider.  Follow instructions from your health care provider about how to take care of your IV insertion site. Make sure you:  Wash your hands with soap and water before you change your  bandage (dressing). If soap and water are not available, use hand sanitizer.  Change your dressing as told by your health care provider.  Check your IV insertion site every day for signs of infection. Check for:  More redness, swelling, or pain.  More fluid or blood.  Warmth.  Pus or a bad smell. Contact a health care provider if:  You have more redness, swelling, or pain around the IV insertion site.  You have more fluid or blood coming from the IV insertion site.  Your IV insertion site feels warm to the touch.  You have pus or a bad smell coming from the IV insertion site.  Your urine turns pink, red, or brown.  You feel weak after doing your normal activities. Get help right away if:  You have signs of a serious allergic or immune system reaction, including:  Itchiness.  Hives.  Trouble breathing.  Anxiety.  Chest or lower back pain.  Fever, flushing, and chills.  Rapid pulse.  Rash.  Diarrhea.  Vomiting.  Dark urine.  Serious headache.  Dizziness.  Stiff neck.  Yellow coloration of the face or the white parts of the eyes (jaundice). This information is not intended to replace advice given to you by your health care provider. Make sure you discuss any questions you have with your health care provider. Document Released: 05/16/2014 Document Revised: 12/23/2015 Document Reviewed: 11/09/2015 Elsevier Interactive Patient Education  2017 [employer].

## 2016-09-28 NOTE — Progress Notes (Signed)
Per Junious Dresser RN, per MD Cindra Presume to treat with Hemoglobin 7.8 today. Per Junious Dresser RN, Per MD Alvy Bimler, patient to receive 1 unit PRBC after chemotherapy infusion

## 2016-09-29 LAB — TYPE AND SCREEN
ABO/RH(D): O POS
ANTIBODY SCREEN: NEGATIVE
Donor AG Type: NEGATIVE
UNIT DIVISION: 0

## 2016-09-29 LAB — BPAM RBC
Blood Product Expiration Date: 201806142359
ISSUE DATE / TIME: 201805231233
Unit Type and Rh: 5100

## 2016-09-30 ENCOUNTER — Ambulatory Visit (HOSPITAL_BASED_OUTPATIENT_CLINIC_OR_DEPARTMENT_OTHER): Payer: Medicare Other

## 2016-09-30 ENCOUNTER — Telehealth: Payer: Self-pay

## 2016-09-30 VITALS — BP 119/62 | HR 95 | Temp 97.3°F | Resp 20

## 2016-09-30 DIAGNOSIS — Z5189 Encounter for other specified aftercare: Secondary | ICD-10-CM | POA: Diagnosis not present

## 2016-09-30 DIAGNOSIS — C3412 Malignant neoplasm of upper lobe, left bronchus or lung: Secondary | ICD-10-CM

## 2016-09-30 DIAGNOSIS — C7802 Secondary malignant neoplasm of left lung: Secondary | ICD-10-CM

## 2016-09-30 DIAGNOSIS — C801 Malignant (primary) neoplasm, unspecified: Secondary | ICD-10-CM

## 2016-09-30 MED ORDER — PEGFILGRASTIM INJECTION 6 MG/0.6ML ~~LOC~~
6.0000 mg | PREFILLED_SYRINGE | Freq: Once | SUBCUTANEOUS | Status: AC
  Administered 2016-09-30: 6 mg via SUBCUTANEOUS
  Filled 2016-09-30: qty 0.6

## 2016-09-30 NOTE — Telephone Encounter (Signed)
Wife called to make sure pt needs his injection today. Educated that chemo decreases blood counts and the injection helps body make WBCs, infection fighters. Yes he needs to keep appt. Wife was appreciative of information.

## 2016-09-30 NOTE — Patient Instructions (Signed)
Pegfilgrastim injection What is this medicine? PEGFILGRASTIM (PEG fil gra stim) is a long-acting granulocyte colony-stimulating factor that stimulates the growth of neutrophils, a type of white blood cell important in the body's fight against infection. It is used to reduce the incidence of fever and infection in patients with certain types of cancer who are receiving chemotherapy that affects the bone marrow, and to increase survival after being exposed to high doses of radiation. This medicine may be used for other purposes; ask your health care provider or pharmacist if you have questions. COMMON BRAND NAME(S): Neulasta What should I tell my health care provider before I take this medicine? They need to know if you have any of these conditions: -kidney disease -latex allergy -ongoing radiation therapy -sickle cell disease -skin reactions to acrylic adhesives (On-Body Injector only) -an unusual or allergic reaction to pegfilgrastim, filgrastim, other medicines, foods, dyes, or preservatives -pregnant or trying to get pregnant -breast-feeding How should I use this medicine? This medicine is for injection under the skin. If you get this medicine at home, you will be taught how to prepare and give the pre-filled syringe or how to use the On-body Injector. Refer to the patient Instructions for Use for detailed instructions. Use exactly as directed. Tell your healthcare provider immediately if you suspect that the On-body Injector may not have performed as intended or if you suspect the use of the On-body Injector resulted in a missed or partial dose. It is important that you put your used needles and syringes in a special sharps container. Do not put them in a trash can. If you do not have a sharps container, call your pharmacist or healthcare provider to get one. Talk to your pediatrician regarding the use of this medicine in children. While this drug may be prescribed for selected conditions,  precautions do apply. Overdosage: If you think you have taken too much of this medicine contact a poison control center or emergency room at once. NOTE: This medicine is only for you. Do not share this medicine with others. What if I miss a dose? It is important not to miss your dose. Call your doctor or health care professional if you miss your dose. If you miss a dose due to an On-body Injector failure or leakage, a new dose should be administered as soon as possible using a single prefilled syringe for manual use. What may interact with this medicine? Interactions have not been studied. Give your health care provider a list of all the medicines, herbs, non-prescription drugs, or dietary supplements you use. Also tell them if you smoke, drink alcohol, or use illegal drugs. Some items may interact with your medicine. This list may not describe all possible interactions. Give your health care provider a list of all the medicines, herbs, non-prescription drugs, or dietary supplements you use. Also tell them if you smoke, drink alcohol, or use illegal drugs. Some items may interact with your medicine. What should I watch for while using this medicine? You may need blood work done while you are taking this medicine. If you are going to need a MRI, CT scan, or other procedure, tell your doctor that you are using this medicine (On-Body Injector only). What side effects may I notice from receiving this medicine? Side effects that you should report to your doctor or health care professional as soon as possible: -allergic reactions like skin rash, itching or hives, swelling of the face, lips, or tongue -dizziness -fever -pain, redness, or irritation at site   where injected -pinpoint red spots on the skin -red or dark-brown urine -shortness of breath or breathing problems -stomach or side pain, or pain at the shoulder -swelling -tiredness -trouble passing urine or change in the amount of urine Side  effects that usually do not require medical attention (report to your doctor or health care professional if they continue or are bothersome): -bone pain -muscle pain This list may not describe all possible side effects. Call your doctor for medical advice about side effects. You may report side effects to FDA at 1-800-FDA-1088. Where should I keep my medicine? Keep out of the reach of children. Store pre-filled syringes in a refrigerator between 2 and 8 degrees C (36 and 46 degrees F). Do not freeze. Keep in carton to protect from light. Throw away this medicine if it is left out of the refrigerator for more than 48 hours. Throw away any unused medicine after the expiration date. NOTE: This sheet is a summary. It may not cover all possible information. If you have questions about this medicine, talk to your doctor, pharmacist, or health care provider.  2018 Elsevier/Gold Standard (2016-04-21 12:58:03)  

## 2016-10-07 NOTE — Addendum Note (Signed)
Addendum  created 10/07/16 1323 by Rica Koyanagi, MD   Sign clinical note

## 2016-10-12 ENCOUNTER — Ambulatory Visit (HOSPITAL_BASED_OUTPATIENT_CLINIC_OR_DEPARTMENT_OTHER): Payer: Medicare Other | Admitting: Hematology and Oncology

## 2016-10-12 ENCOUNTER — Other Ambulatory Visit: Payer: Self-pay | Admitting: Hematology and Oncology

## 2016-10-12 ENCOUNTER — Ambulatory Visit: Payer: Medicare Other

## 2016-10-12 ENCOUNTER — Ambulatory Visit (HOSPITAL_BASED_OUTPATIENT_CLINIC_OR_DEPARTMENT_OTHER): Payer: Medicare Other

## 2016-10-12 ENCOUNTER — Telehealth: Payer: Self-pay | Admitting: Hematology and Oncology

## 2016-10-12 ENCOUNTER — Other Ambulatory Visit (HOSPITAL_BASED_OUTPATIENT_CLINIC_OR_DEPARTMENT_OTHER): Payer: Medicare Other

## 2016-10-12 ENCOUNTER — Encounter: Payer: Self-pay | Admitting: Hematology and Oncology

## 2016-10-12 ENCOUNTER — Ambulatory Visit: Payer: Medicare Other | Admitting: Nutrition

## 2016-10-12 VITALS — BP 124/60 | HR 68 | Temp 97.6°F | Resp 18

## 2016-10-12 DIAGNOSIS — C3412 Malignant neoplasm of upper lobe, left bronchus or lung: Secondary | ICD-10-CM | POA: Diagnosis not present

## 2016-10-12 DIAGNOSIS — Z720 Tobacco use: Secondary | ICD-10-CM

## 2016-10-12 DIAGNOSIS — D4621 Refractory anemia with excess of blasts 1: Secondary | ICD-10-CM

## 2016-10-12 DIAGNOSIS — C801 Malignant (primary) neoplasm, unspecified: Secondary | ICD-10-CM

## 2016-10-12 DIAGNOSIS — C7801 Secondary malignant neoplasm of right lung: Secondary | ICD-10-CM | POA: Diagnosis not present

## 2016-10-12 DIAGNOSIS — C7802 Secondary malignant neoplasm of left lung: Secondary | ICD-10-CM

## 2016-10-12 DIAGNOSIS — Z5111 Encounter for antineoplastic chemotherapy: Secondary | ICD-10-CM

## 2016-10-12 DIAGNOSIS — D4620 Refractory anemia with excess of blasts, unspecified: Secondary | ICD-10-CM

## 2016-10-12 LAB — COMPREHENSIVE METABOLIC PANEL
ALBUMIN: 3.4 g/dL — AB (ref 3.5–5.0)
AST: 10 U/L (ref 5–34)
Alkaline Phosphatase: 106 U/L (ref 40–150)
Anion Gap: 8 mEq/L (ref 3–11)
BILIRUBIN TOTAL: 0.33 mg/dL (ref 0.20–1.20)
BUN: 26 mg/dL (ref 7.0–26.0)
CALCIUM: 9.1 mg/dL (ref 8.4–10.4)
CO2: 20 meq/L — AB (ref 22–29)
CREATININE: 1 mg/dL (ref 0.7–1.3)
Chloride: 113 mEq/L — ABNORMAL HIGH (ref 98–109)
EGFR: >90 mL/min/{1.73_m2} (ref >90)
Glucose: 85 mg/dl (ref 70–140)
Potassium: 4.3 mEq/L (ref 3.5–5.1)
Sodium: 140 mEq/L (ref 136–145)
TOTAL PROTEIN: 8.3 g/dL (ref 6.4–8.3)

## 2016-10-12 LAB — CBC WITH DIFFERENTIAL/PLATELET
BASO%: 0.1 % (ref 0.0–2.0)
Basophils Absolute: 0 10*3/uL (ref 0.0–0.1)
EOS%: 0.1 % (ref 0.0–7.0)
Eosinophils Absolute: 0 10*3/uL (ref 0.0–0.5)
HCT: 29.1 % — ABNORMAL LOW (ref 38.4–49.9)
HEMOGLOBIN: 9.2 g/dL — AB (ref 13.0–17.1)
LYMPH%: 5 % — ABNORMAL LOW (ref 14.0–49.0)
MCH: 35.7 pg — ABNORMAL HIGH (ref 27.2–33.4)
MCHC: 31.6 g/dL — ABNORMAL LOW (ref 32.0–36.0)
MCV: 112.8 fL — ABNORMAL HIGH (ref 79.3–98.0)
MONO#: 1 10*3/uL — AB (ref 0.1–0.9)
MONO%: 4.7 % (ref 0.0–14.0)
NEUT%: 90.1 % — ABNORMAL HIGH (ref 39.0–75.0)
NEUTROS ABS: 19.6 10*3/uL — AB (ref 1.5–6.5)
PLATELETS: 212 10*3/uL (ref 140–400)
RBC: 2.58 10*6/uL — ABNORMAL LOW (ref 4.20–5.82)
RDW: 19.9 % — AB (ref 11.0–14.6)
WBC: 21.7 10*3/uL — AB (ref 4.0–10.3)
lymph#: 1.1 10*3/uL (ref 0.9–3.3)

## 2016-10-12 MED ORDER — PALONOSETRON HCL INJECTION 0.25 MG/5ML
INTRAVENOUS | Status: AC
  Filled 2016-10-12: qty 5

## 2016-10-12 MED ORDER — SODIUM CHLORIDE 0.9 % IV SOLN
Freq: Once | INTRAVENOUS | Status: AC
  Administered 2016-10-12: 10:00:00 via INTRAVENOUS

## 2016-10-12 MED ORDER — SODIUM CHLORIDE 0.9 % IV SOLN
20.0000 mg | Freq: Once | INTRAVENOUS | Status: AC
  Administered 2016-10-12: 20 mg via INTRAVENOUS
  Filled 2016-10-12: qty 2

## 2016-10-12 MED ORDER — SODIUM CHLORIDE 0.9% FLUSH
10.0000 mL | INTRAVENOUS | Status: DC | PRN
  Administered 2016-10-12: 10 mL
  Filled 2016-10-12: qty 10

## 2016-10-12 MED ORDER — HEPARIN SOD (PORK) LOCK FLUSH 100 UNIT/ML IV SOLN
500.0000 [IU] | Freq: Once | INTRAVENOUS | Status: DC
  Filled 2016-10-12: qty 5

## 2016-10-12 MED ORDER — DIPHENHYDRAMINE HCL 50 MG/ML IJ SOLN
INTRAMUSCULAR | Status: AC
  Filled 2016-10-12: qty 1

## 2016-10-12 MED ORDER — FAMOTIDINE IN NACL 20-0.9 MG/50ML-% IV SOLN
INTRAVENOUS | Status: AC
  Filled 2016-10-12: qty 50

## 2016-10-12 MED ORDER — FAMOTIDINE IN NACL 20-0.9 MG/50ML-% IV SOLN
20.0000 mg | Freq: Once | INTRAVENOUS | Status: AC
  Administered 2016-10-12: 20 mg via INTRAVENOUS

## 2016-10-12 MED ORDER — SODIUM CHLORIDE 0.9 % IJ SOLN
10.0000 mL | Freq: Once | INTRAMUSCULAR | Status: AC
  Administered 2016-10-12: 10 mL via INTRAVENOUS
  Filled 2016-10-12: qty 10

## 2016-10-12 MED ORDER — HEPARIN SOD (PORK) LOCK FLUSH 100 UNIT/ML IV SOLN
500.0000 [IU] | Freq: Once | INTRAVENOUS | Status: AC | PRN
Start: 2016-10-12 — End: 2016-10-12
  Administered 2016-10-12: 500 [IU]
  Filled 2016-10-12: qty 5

## 2016-10-12 MED ORDER — SODIUM CHLORIDE 0.9 % IV SOLN
153.4000 mg | Freq: Once | INTRAVENOUS | Status: AC
  Administered 2016-10-12: 150 mg via INTRAVENOUS
  Filled 2016-10-12: qty 15

## 2016-10-12 MED ORDER — DARBEPOETIN ALFA 500 MCG/ML IJ SOSY
500.0000 ug | PREFILLED_SYRINGE | Freq: Once | INTRAMUSCULAR | Status: AC
  Administered 2016-10-12: 500 ug via SUBCUTANEOUS
  Filled 2016-10-12: qty 1

## 2016-10-12 MED ORDER — PACLITAXEL CHEMO INJECTION 300 MG/50ML
45.0000 mg/m2 | Freq: Once | INTRAVENOUS | Status: AC
  Administered 2016-10-12: 72 mg via INTRAVENOUS
  Filled 2016-10-12: qty 12

## 2016-10-12 MED ORDER — DIPHENHYDRAMINE HCL 50 MG/ML IJ SOLN
50.0000 mg | Freq: Once | INTRAMUSCULAR | Status: AC
  Administered 2016-10-12: 50 mg via INTRAVENOUS

## 2016-10-12 MED ORDER — PALONOSETRON HCL INJECTION 0.25 MG/5ML
0.2500 mg | Freq: Once | INTRAVENOUS | Status: AC
  Administered 2016-10-12: 0.25 mg via INTRAVENOUS

## 2016-10-12 NOTE — Patient Instructions (Addendum)
[facility] Discharge Instructions for Patients Receiving Chemotherapy  Today you received the following chemotherapy agents Taxol and Carboplatin   To help prevent nausea and vomiting after your treatment, we encourage you to take your nausea medication as directed. No Zofran for 3 days. Take Compazine instead.    If you develop nausea and vomiting that is not controlled by your nausea medication, call the clinic.   BELOW ARE SYMPTOMS THAT SHOULD BE REPORTED IMMEDIATELY:  *FEVER GREATER THAN 100.5 F  *CHILLS WITH OR WITHOUT FEVER  NAUSEA AND VOMITING THAT IS NOT CONTROLLED WITH YOUR NAUSEA MEDICATION  *UNUSUAL SHORTNESS OF BREATH  *UNUSUAL BRUISING OR BLEEDING  TENDERNESS IN MOUTH AND THROAT WITH OR WITHOUT PRESENCE OF ULCERS  *URINARY PROBLEMS  *BOWEL PROBLEMS  UNUSUAL RASH Items with * indicate a potential emergency and should be followed up as soon as possible.  Feel free to call the clinic you have any questions or concerns. The clinic phone number is [phone].  Please show the [facility] at check-in to the Emergency Department and triage nurse.  Darbepoetin Alfa injection What is this medicine? DARBEPOETIN ALFA (dar be POE e tin AL fa) helps your body make more red blood cells. It is used to treat anemia caused by chronic kidney failure and chemotherapy. This medicine may be used for other purposes; ask your health care provider or pharmacist if you have questions. COMMON BRAND NAME(S): Aranesp What should I tell my health care provider before I take this medicine? They need to know if you have any of these conditions: -blood clotting disorders or history of blood clots -cancer patient not on chemotherapy -cystic fibrosis -heart disease, such as angina, heart failure, or a history of a heart attack -hemoglobin level of 12 g/dL or greater -high blood pressure -low levels of folate, iron, or vitamin B12 -seizures -an unusual or allergic  reaction to darbepoetin, erythropoietin, albumin, hamster proteins, latex, other medicines, foods, dyes, or preservatives -pregnant or trying to get pregnant -breast-feeding How should I use this medicine? This medicine is for injection into a vein or under the skin. It is usually given by a health care professional in a hospital or clinic setting. If you get this medicine at home, you will be taught how to prepare and give this medicine. Use exactly as directed. Take your medicine at regular intervals. Do not take your medicine more often than directed. It is important that you put your used needles and syringes in a special sharps container. Do not put them in a trash can. If you do not have a sharps container, call your pharmacist or healthcare provider to get one. A special MedGuide will be given to you by the pharmacist with each prescription and refill. Be sure to read this information carefully each time. Talk to your pediatrician regarding the use of this medicine in children. While this medicine may be used in children as Ronaldo Crilly as 1 year for selected conditions, precautions do apply. Overdosage: If you think you have taken too much of this medicine contact a poison control center or emergency room at once. NOTE: This medicine is only for you. Do not share this medicine with others. What if I miss a dose? If you miss a dose, take it as soon as you can. If it is almost time for your next dose, take only that dose. Do not take double or extra doses. What may interact with this medicine? Do not take this medicine with any  of the following medications: -epoetin alfa This list may not describe all possible interactions. Give your health care provider a list of all the medicines, herbs, non-prescription drugs, or dietary supplements you use. Also tell them if you smoke, drink alcohol, or use illegal drugs. Some items may interact with your medicine. What should I watch for while using this  medicine? Your condition will be monitored carefully while you are receiving this medicine. You may need blood work done while you are taking this medicine. What side effects may I notice from receiving this medicine? Side effects that you should report to your doctor or health care professional as soon as possible: -allergic reactions like skin rash, itching or hives, swelling of the face, lips, or tongue -breathing problems -changes in vision -chest pain -confusion, trouble speaking or understanding -feeling faint or lightheaded, falls -high blood pressure -muscle aches or pains -pain, swelling, warmth in the leg -rapid weight gain -severe headaches -sudden numbness or weakness of the face, arm or leg -trouble walking, dizziness, loss of balance or coordination -seizures (convulsions) -swelling of the ankles, feet, hands -unusually weak or tired Side effects that usually do not require medical attention (report to your doctor or health care professional if they continue or are bothersome): -diarrhea -fever, chills (flu-like symptoms) -headaches -nausea, vomiting -redness, stinging, or swelling at site where injected This list may not describe all possible side effects. Call your doctor for medical advice about side effects. You may report side effects to FDA at 1-800-FDA-1088. Where should I keep my medicine? Keep out of the reach of children. Store in a refrigerator between 2 and 8 degrees C (36 and 46 degrees F). Do not freeze. Do not shake. Throw away any unused portion if using a single-dose vial. Throw away any unused medicine after the expiration date. NOTE: This sheet is a summary. It may not cover all possible information. If you have questions about this medicine, talk to your doctor, pharmacist, or health care provider.  2018 Elsevier/Gold Standard (2015-12-14 19:52:26)

## 2016-10-12 NOTE — Progress Notes (Signed)
Nutrition follow-up completed with patient during chemotherapy for metastatic lung cancer. Weight decreased and documented as 108 pounds on June 6, down from 113.1 pounds May 16. He drinks one oral nutrition supplement daily. Patient denies any problems eating or drinking. He denies nutrition impact symptoms. Declines offer of providing samples or coupons for oral nutrition supplements.  Nutrition diagnosis: Underweight continues.  Intervention: Educated patient to try to increase calories and protein to minimize further weight loss. Recommended patient try to increase oral nutrition supplements twice a day to 3 times a day Encouraged patient to reach out he has any nutrition needs.   Questions were answered.  Teach back method used.  Monitoring, evaluation, goals: Patient will tolerate increased calories and protein to minimize further weight loss.  Next visit: To be scheduled as needed.  **Disclaimer: This note was dictated with voice recognition software. Similar sounding words can inadvertently be transcribed and this note may contain transcription errors which may not have been corrected upon publication of note.**

## 2016-10-12 NOTE — Assessment & Plan Note (Signed)
I reviewed the guidelines with the patient and his wife. The patient has stage IV disease, treatment is noncurable due to bilateral lung involvement. The patient has stopped smoking. He will proceed with treatment today with combination treatment with carboplatin and Taxol, to be given on days 1, 8 and rest day 15 for cycle of every 21 days He will get Neulasta injection after day 8 of treatment. His abnormal CBC likely reflect recent G-CSF injection. We will continue minimum 6 doses of treatment before repeat imaging study, due in July

## 2016-10-12 NOTE — Assessment & Plan Note (Signed)
He told me he has stop smoking.  I recommend he continue to stay abstinent

## 2016-10-12 NOTE — Telephone Encounter (Signed)
Appointments scheduled per 10/12/16 los. 11/02/16 appointments time and date scheduled, per Dr Alvy Bimler, per 10/12/16 los. Patient was given a a copy of the [facility] re[port and appointment schedule, per 10/12/16 los.

## 2016-10-12 NOTE — Progress Notes (Signed)
[place] OFFICE PROGRESS NOTE  Patient Care Team: [name], MD as PCP - General (Family Medicine) [name], MD as Consulting Physician (Hematology and Oncology)  SUMMARY OF ONCOLOGIC HISTORY: Oncology History   MDS, R-IPSS score of 3, low risk (Hg 8.9, +16 chromosome on BM, 2% blast count)   Squamous cell carcinoma of the epiglottis   Primary site: Larynx - Supraglottis (Left)   Staging method: AJCC 7th Edition   Clinical: Stage I (T1, N0, M0) signed by [name], MD on 04/30/2013 12:49 PM   Pathologic: Stage I (T1, N0, cM0) signed by [name], MD on 04/30/2013 12:49 PM   Summary: Stage I (T1, N0, cM0)       MDS (myelodysplastic syndrome), low grade ([place])   02/01/2007 Bone Marrow Biopsy    BM biopsy was abnormal, overall probable low grade MDS      03/23/2011 - 02/06/2013 Chemotherapy    [name], discontinued due to diagnosis of laryngeal ca      09/06/2013 Bone Marrow Biopsy    Repeat bone marrow aspirate and biopsy confirmed low-grade myelodysplastic syndrome.      09/17/2013 -  Chemotherapy    The patient resumed darbepoetin injections to treat the anemia.       History of head and neck cancer   03/06/2013 Procedure    Biopsy from epiglottic region showed invasive [facility]      04/25/2013 Imaging    Ct scan showed no other involvement in the LN      05/13/2013 - 06/28/2013 Radiation Therapy    He received radiation therapy, 70 gray in 35 fractions to the larynx      06/09/2016 Imaging    CT scan of the chest showed Bilateral spiculated soft tissue pulmonary masses, highly suspicious for multifocal malignancy. Borderline enlarged left-sided mediastinal lymphadenopathy. Background of severe emphysematous changes, chronic cylindrical bronchiectasis and hyperinflation of the lungs. Findings consistent with longstanding COPD. Diffusely thickened interstitial markings. This may represent chronic interstitial lung changes, however  lymphangitic spread of malignancy cannot be excluded. Anterior compression deformities of several of the thoracic vertebral bodies, with heterogeneous appearance of T7 and T9 vertebral body. This may represent degenerative changes, however osseous metastatic disease cannot be excluded.       07/11/2016 PET scan    There are 2 nodules in the left upper lobe and 2 nodules within the right lower lobe which have spiculated margin and exhibit intense radiotracer uptake compatible with multifocal pulmonary metastasis versus multiple synchronous primary pulmonary neoplasms. 2. Emphysema 3. Aortic atherosclerosis and multi vessel coronary artery calcification.       Cancer of upper lobe of left lung ([place])   06/09/2016 Imaging    CT chest: Bilateral spiculated soft tissue pulmonary masses, highly suspicious for multifocal malignancy. Borderline enlarged left-sided mediastinal lymphadenopathy. Background of severe emphysematous changes, chronic cylindrical bronchiectasis and hyperinflation of the lungs. Findings consistent with longstanding COPD. Diffusely thickened interstitial markings. This may represent chronic interstitial lung changes, however lymphangitic spread of malignancy cannot be excluded. Anterior compression deformities of several of the thoracic vertebral bodies, with heterogeneous appearance of T7 and T9 vertebral body. This may represent degenerative changes, however osseous metastatic disease cannot be excluded.       06/29/2016 Imaging    MRI brain: No evidence of metastatic disease or recent infarction. Advanced generalized brain atrophy with moderate to marked chronic small-vessel ischemic changes throughout. FLAIR imaging appears to show scattered gyri showing FLAIR hyperintensity. These abnormalities are not  confirmed with restricted diffusion or contrast enhancement. Therefore, we are not certain that this is not artifactual. The differential diagnosis does include true pathology such  as posterior reversible encephalopathy, viral or prion disease, postictal change in post chemotherapy change. If there is concern about active CNS pathology, re- scanning in 4-6 weeks could be useful.      07/27/2016 Imaging    CT chest: Bilateral pulmonary lesions. These are suspicious for metastatic disease. Lesions have minimally changed but there may be slight enlargement of the cavitary lesion in the right lower lobe. Severe emphysematous changes.       08/10/2016 Pathology Results    1. Lung, biopsy, RUL, (apical segment) - SCANT BENIGN LUNG PARENCHYMA. - THERE IS NO EVIDENCE OF MALIGNANCY. 2. Lung, biopsy, LUL lingula - SQUAMOUS CELL CARCINOMA. - SEE COMMENT. 3. Lung, biopsy, LUL - ADENOCARCINOMA. - SEE COMMENT. Microscopic Comment 2. The malignant cells are positive for cytokeratin 56 and p63. They are negative for TTF-1. The findings are consistent with squamous cell carcinoma. There is likely insufficient tissue remaining for additional studies, if requested. 3. The malignant cells are positive for TTF-1 and negative for p63 and cytokeratin 5/6. The findings are consistent with adenocarcinoma.      08/10/2016 Procedure    The targets were defined as follows: Left upper lobe nodule, target 1 Lingular nodule, target 2 Proximal right lower lobe nodule, target 3 More distal right lower lobe, nodule  The extendable working channel was secured into place and the locator guide was withdrawn. Under fluoroscopic guidance transbronchial needle brushings, transbronchial Wang needle biopsies, and transbronchial forceps biopsies were performed in the LUL (target 1) and the lingula (target 2) to be sent for cytology and pathology. A bronchioalveolar lavage was performed in the lingula in the vicinity of target 2  and sent for cytology and microbiology (bacterial, fungal, AFB smears and cultures).  Fiducial markers were then placed around target 1, target 2, and in the right lower lobe in the  vicinity of both target 3 and target 4 to be used for possible radiation therapy should this be indicated. At the end of the procedure a general airway inspection was performed and there was no evidence of active bleeding. The bronchoscope was removed.  The patient tolerated the procedure well. There was no significant blood loss and there were no obvious complications. A post-procedural chest x-ray is pending.  Samples: 1. Transbronchial needle brushings from targets 1 and 2 2. Transbronchial Wang needle biopsies from targets 1 and 2 3. Transbronchial forceps biopsies from targets 1 and 2 4. Bronchoalveolar lavage from lingula, target 2 5. Endobronchial biopsies from RUL apical segment 6. Endobronchial brushings from right upper lobe apical segment       08/10/2016 Genetic Testing    Patient has genetic testing done for [employer] and PD-L1 testing Results revealed PD-L1 testing is negative 0%      08/31/2016 Procedure    Technically successful right IJ power-injectable port catheter placement. Ready for routine use       INTERVAL HISTORY: Please see below for problem oriented charting. He is seen prior to cycle 3 of treatment. He tolerated treatment well recently except for severe pancytopenia requiring blood transfusion. He did not smoke recently.  No nausea or vomiting.  Denies peripheral neuropathy.  Denies cough, chest pain or shortness of breath  REVIEW OF SYSTEMS:   Constitutional: Denies fevers, chills or abnormal weight loss Eyes: Denies blurriness of vision Ears, nose, mouth, throat, and face: Denies  mucositis or sore throat Respiratory: Denies cough, dyspnea or wheezes Cardiovascular: Denies palpitation, chest discomfort or lower extremity swelling Gastrointestinal:  Denies nausea, heartburn or change in bowel habits Skin: Denies abnormal skin rashes Lymphatics: Denies new lymphadenopathy or easy bruising Neurological:Denies numbness, tingling or new  weaknesses Behavioral/Psych: Mood is stable, no new changes  All other systems were reviewed with the patient and are negative.  I have reviewed the past medical history, past surgical history, social history and family history with the patient and they are unchanged from previous note.  ALLERGIES:  is allergic to no known allergies.  MEDICATIONS:  Current Outpatient Prescriptions  Medication Sig Dispense Refill  . budesonide-formoterol (SYMBICORT) 80-4.5 MCG/ACT inhaler Inhale 2 puffs into the lungs 2 (two) times daily as needed (Short of breath).    . cyanocobalamin 500 MCG tablet Take 500 mcg by mouth daily.    . diazepam (VALIUM) 5 MG tablet Take 5 mg by mouth every 6 (six) hours as needed for anxiety.     . ferrous sulfate 325 (65 FE) MG tablet Take 325 mg by mouth daily with breakfast.    . folic acid (FOLVITE) 1 MG tablet Take 1 mg by mouth daily.    Marland Kitchen levETIRAcetam (KEPPRA) 500 MG tablet Take 1 tablet (500 mg total) by mouth 2 (two) times daily. 60 tablet 11  . lidocaine-prilocaine (EMLA) cream Apply to affected area once 30 g 3  . mometasone (NASONEX) 50 MCG/ACT nasal spray Place 2 sprays into both nostrils daily.    . ondansetron (ZOFRAN) 8 MG tablet Take 1 tablet (8 mg total) by mouth 2 (two) times daily as needed for refractory nausea / vomiting. Start on day 3 after chemo. (Patient not taking: Reported on 09/21/2016) 30 tablet 1  . prochlorperazine (COMPAZINE) 10 MG tablet Take 1 tablet (10 mg total) by mouth every 6 (six) hours as needed (Nausea or vomiting). (Patient not taking: Reported on 09/21/2016) 30 tablet 1  . vitamin E 1000 UNIT capsule Take 1,000 Units by mouth daily.      No current facility-administered medications for this visit.     PHYSICAL EXAMINATION: ECOG PERFORMANCE STATUS: 1 - Symptomatic but completely ambulatory  Vitals:   10/12/16 0918  BP: 110/62  Pulse: 73  Resp: 18  Temp: 98.7 F (37.1 C)   Filed Weights   10/12/16 0918  Weight: 108 lb 6.4  oz (49.2 kg)    GENERAL:alert, no distress and comfortable SKIN: skin color, texture, turgor are normal, no rashes or significant lesions EYES: normal, Conjunctiva are pink and non-injected, sclera clear OROPHARYNX:no exudate, no erythema and lips, buccal mucosa, and tongue normal  NECK: supple, thyroid normal size, non-tender, without nodularity LYMPH:  no palpable lymphadenopathy in the cervical, axillary or inguinal LUNGS: clear to auscultation and percussion with normal breathing effort HEART: regular rate & rhythm and no murmurs and no lower extremity edema ABDOMEN:abdomen soft, non-tender and normal bowel sounds Musculoskeletal:no cyanosis of digits and no clubbing  NEURO: alert & oriented x 3 with fluent speech, no focal motor/sensory deficits  LABORATORY DATA:  I have reviewed the data as listed    Component Value Date/Time   NA 140 10/12/2016 0826   K 4.3 10/12/2016 0826   CL 108 08/31/2016 1155   CL 107 09/19/2012 1325   CO2 20 (L) 10/12/2016 0826   GLUCOSE 85 10/12/2016 0826   GLUCOSE 97 09/19/2012 1325   BUN 26.0 10/12/2016 0826   CREATININE 1.0 10/12/2016 0826   CALCIUM 9.1  10/12/2016 0826   PROT 8.3 10/12/2016 0826   ALBUMIN 3.4 (L) 10/12/2016 0826   AST 10 10/12/2016 0826   ALT <6 10/12/2016 0826   ALKPHOS 106 10/12/2016 0826   BILITOT 0.33 10/12/2016 0826   GFRNONAA >60 08/31/2016 1155   GFRAA >60 08/31/2016 1155    No results found for: SPEP, UPEP  Lab Results  Component Value Date   WBC 21.7 (H) 10/12/2016   NEUTROABS 19.6 (H) 10/12/2016   HGB 9.2 (L) 10/12/2016   HCT 29.1 (L) 10/12/2016   MCV 112.8 (H) 10/12/2016   PLT 212 10/12/2016      Chemistry      Component Value Date/Time   NA 140 10/12/2016 0826   K 4.3 10/12/2016 0826   CL 108 08/31/2016 1155   CL 107 09/19/2012 1325   CO2 20 (L) 10/12/2016 0826   BUN 26.0 10/12/2016 0826   CREATININE 1.0 10/12/2016 0826      Component Value Date/Time   CALCIUM 9.1 10/12/2016 0826   ALKPHOS  106 10/12/2016 0826   AST 10 10/12/2016 0826   ALT <6 10/12/2016 0826   BILITOT 0.33 10/12/2016 0826      ASSESSMENT & PLAN:  Cancer of upper lobe of left lung ([place]) I reviewed the guidelines with the patient and his wife. The patient has stage IV disease, treatment is noncurable due to bilateral lung involvement. The patient has stopped smoking. He will proceed with treatment today with combination treatment with carboplatin and Taxol, to be given on days 1, 8 and rest day 15 for cycle of every 21 days He will get Neulasta injection after day 8 of treatment. His abnormal CBC likely reflect recent G-CSF injection. We will continue minimum 6 doses of treatment before repeat imaging study, due in July   Tobacco abuse He told me he has stop smoking.  I recommend he continue to stay abstinent  MDS (myelodysplastic syndrome), low grade The anemia from MDS is stable. I suspect he may have some degree of bone marrow suppression from his antiseizure medications. He had responded well to Darbopeitin in the past with improvement in energy level.  The patient will get regular blood work done. The patient will continue on Aranep injection to keep hemoglobin greater than 10 g. He will continue to get darbepoetin injection while on treatment, scheduled to be given every 2 weeks. He had recently received blood transfusion with symptomatic improvement.  He will continue to receive blood if hemoglobin is less than 8   No orders of the defined types were placed in this encounter.  All questions were answered. The patient knows to call the clinic with any problems, questions or concerns. No barriers to learning was detected. I spent 15 minutes counseling the patient face to face. The total time spent in the appointment was 20 minutes and more than 50% was on counseling and review of test results     [name], MD 10/12/2016 9:43 AM

## 2016-10-12 NOTE — Patient Instructions (Signed)

## 2016-10-12 NOTE — Assessment & Plan Note (Signed)
The anemia from MDS is stable. I suspect he may have some degree of bone marrow suppression from his antiseizure medications. He had responded well to Darbopeitin in the past with improvement in energy level.  The patient will get regular blood work done. The patient will continue on Aranep injection to keep hemoglobin greater than 10 g. He will continue to get darbepoetin injection while on treatment, scheduled to be given every 2 weeks. He had recently received blood transfusion with symptomatic improvement.  He will continue to receive blood if hemoglobin is less than 8

## 2016-10-19 ENCOUNTER — Other Ambulatory Visit: Payer: Self-pay | Admitting: Hematology and Oncology

## 2016-10-19 ENCOUNTER — Ambulatory Visit (HOSPITAL_COMMUNITY)
Admission: RE | Admit: 2016-10-19 | Discharge: 2016-10-19 | Disposition: A | Discharge status: Home / Self Care | Payer: Medicare Other | Source: Ambulatory Visit | Attending: Hematology and Oncology | Admitting: Hematology and Oncology

## 2016-10-19 ENCOUNTER — Other Ambulatory Visit (HOSPITAL_BASED_OUTPATIENT_CLINIC_OR_DEPARTMENT_OTHER): Payer: Medicare Other

## 2016-10-19 ENCOUNTER — Ambulatory Visit (HOSPITAL_BASED_OUTPATIENT_CLINIC_OR_DEPARTMENT_OTHER): Payer: Medicare Other

## 2016-10-19 VITALS — BP 106/59 | HR 78 | Temp 98.6°F | Resp 16

## 2016-10-19 DIAGNOSIS — D6481 Anemia due to antineoplastic chemotherapy: Secondary | ICD-10-CM | POA: Diagnosis not present

## 2016-10-19 DIAGNOSIS — T451X5A Adverse effect of antineoplastic and immunosuppressive drugs, initial encounter: Secondary | ICD-10-CM

## 2016-10-19 DIAGNOSIS — D4620 Refractory anemia with excess of blasts, unspecified: Secondary | ICD-10-CM

## 2016-10-19 DIAGNOSIS — C7802 Secondary malignant neoplasm of left lung: Secondary | ICD-10-CM

## 2016-10-19 DIAGNOSIS — C3412 Malignant neoplasm of upper lobe, left bronchus or lung: Secondary | ICD-10-CM

## 2016-10-19 DIAGNOSIS — Z5111 Encounter for antineoplastic chemotherapy: Secondary | ICD-10-CM

## 2016-10-19 DIAGNOSIS — C801 Malignant (primary) neoplasm, unspecified: Secondary | ICD-10-CM

## 2016-10-19 LAB — COMPREHENSIVE METABOLIC PANEL
ALBUMIN: 3.2 g/dL — AB (ref 3.5–5.0)
ALK PHOS: 82 U/L (ref 40–150)
ALT: <6 U/L (ref 0–55)
AST: 8 U/L (ref 5–34)
Anion Gap: 8 mEq/L (ref 3–11)
BUN: 31.3 mg/dL — ABNORMAL HIGH (ref 7.0–26.0)
CALCIUM: 8.9 mg/dL (ref 8.4–10.4)
CO2: 19 mEq/L — ABNORMAL LOW (ref 22–29)
Chloride: 112 mEq/L — ABNORMAL HIGH (ref 98–109)
Creatinine: 1 mg/dL (ref 0.7–1.3)
EGFR: 87 mL/min/{1.73_m2} — AB (ref >90)
GLUCOSE: 143 mg/dL — AB (ref 70–140)
POTASSIUM: 4.2 meq/L (ref 3.5–5.1)
SODIUM: 139 meq/L (ref 136–145)
Total Bilirubin: 0.36 mg/dL (ref 0.20–1.20)
Total Protein: 7.7 g/dL (ref 6.4–8.3)

## 2016-10-19 LAB — CBC WITH DIFFERENTIAL/PLATELET
BASO%: 0.8 % (ref 0.0–2.0)
BASOS ABS: 0.1 10*3/uL (ref 0.0–0.1)
EOS ABS: 0 10*3/uL (ref 0.0–0.5)
EOS%: 0.2 % (ref 0.0–7.0)
HCT: 25.9 % — ABNORMAL LOW (ref 38.4–49.9)
HEMOGLOBIN: 8.1 g/dL — AB (ref 13.0–17.1)
LYMPH%: 10.6 % — ABNORMAL LOW (ref 14.0–49.0)
MCH: 35.2 pg — AB (ref 27.2–33.4)
MCHC: 31.3 g/dL — ABNORMAL LOW (ref 32.0–36.0)
MCV: 112.6 fL — AB (ref 79.3–98.0)
MONO#: 0.4 10*3/uL (ref 0.1–0.9)
MONO%: 6.3 % (ref 0.0–14.0)
NEUT#: 4.9 10*3/uL (ref 1.5–6.5)
NEUT%: 82.1 % — ABNORMAL HIGH (ref 39.0–75.0)
Platelets: 142 10*3/uL (ref 140–400)
RBC: 2.3 10*6/uL — ABNORMAL LOW (ref 4.20–5.82)
RDW: 19.6 % — ABNORMAL HIGH (ref 11.0–14.6)
WBC: 6 10*3/uL (ref 4.0–10.3)
lymph#: 0.6 10*3/uL — ABNORMAL LOW (ref 0.9–3.3)

## 2016-10-19 LAB — PREPARE RBC (CROSSMATCH)

## 2016-10-19 MED ORDER — DIPHENHYDRAMINE HCL 50 MG/ML IJ SOLN
INTRAMUSCULAR | Status: AC
  Filled 2016-10-19: qty 1

## 2016-10-19 MED ORDER — FAMOTIDINE IN NACL 20-0.9 MG/50ML-% IV SOLN
20.0000 mg | Freq: Once | INTRAVENOUS | Status: AC
  Administered 2016-10-19: 20 mg via INTRAVENOUS

## 2016-10-19 MED ORDER — DIPHENHYDRAMINE HCL 50 MG/ML IJ SOLN
50.0000 mg | Freq: Once | INTRAMUSCULAR | Status: AC
  Administered 2016-10-19: 50 mg via INTRAVENOUS

## 2016-10-19 MED ORDER — SODIUM CHLORIDE 0.9% FLUSH
10.0000 mL | INTRAVENOUS | Status: DC | PRN
  Administered 2016-10-19: 10 mL
  Filled 2016-10-19: qty 10

## 2016-10-19 MED ORDER — SODIUM CHLORIDE 0.9 % IV SOLN
153.4000 mg | Freq: Once | INTRAVENOUS | Status: AC
  Administered 2016-10-19: 150 mg via INTRAVENOUS
  Filled 2016-10-19: qty 15

## 2016-10-19 MED ORDER — SODIUM CHLORIDE 0.9 % IV SOLN
Freq: Once | INTRAVENOUS | Status: AC
  Administered 2016-10-19: 10:00:00 via INTRAVENOUS

## 2016-10-19 MED ORDER — HEPARIN SOD (PORK) LOCK FLUSH 100 UNIT/ML IV SOLN
500.0000 [IU] | Freq: Once | INTRAVENOUS | Status: AC | PRN
Start: 2016-10-19 — End: 2016-10-19
  Administered 2016-10-19: 500 [IU]
  Filled 2016-10-19: qty 5

## 2016-10-19 MED ORDER — PALONOSETRON HCL INJECTION 0.25 MG/5ML
INTRAVENOUS | Status: AC
  Filled 2016-10-19: qty 5

## 2016-10-19 MED ORDER — PALONOSETRON HCL INJECTION 0.25 MG/5ML
0.2500 mg | Freq: Once | INTRAVENOUS | Status: AC
  Administered 2016-10-19: 0.25 mg via INTRAVENOUS

## 2016-10-19 MED ORDER — SODIUM CHLORIDE 0.9 % IV SOLN
20.0000 mg | Freq: Once | INTRAVENOUS | Status: AC
  Administered 2016-10-19: 20 mg via INTRAVENOUS
  Filled 2016-10-19: qty 2

## 2016-10-19 MED ORDER — PACLITAXEL CHEMO INJECTION 300 MG/50ML
45.0000 mg/m2 | Freq: Once | INTRAVENOUS | Status: AC
  Administered 2016-10-19: 72 mg via INTRAVENOUS
  Filled 2016-10-19: qty 12

## 2016-10-19 MED ORDER — FAMOTIDINE IN NACL 20-0.9 MG/50ML-% IV SOLN
INTRAVENOUS | Status: AC
  Filled 2016-10-19: qty 50

## 2016-10-19 NOTE — Patient Instructions (Signed)
[facility] Discharge Instructions for Patients Receiving Chemotherapy  Today you received the following chemotherapy agents taxol/carboplatin  To help prevent nausea and vomiting after your treatment, we encourage you to take your nausea medication as directed   If you develop nausea and vomiting that is not controlled by your nausea medication, call the clinic.   BELOW ARE SYMPTOMS THAT SHOULD BE REPORTED IMMEDIATELY:  *FEVER GREATER THAN 100.5 F  *CHILLS WITH OR WITHOUT FEVER  NAUSEA AND VOMITING THAT IS NOT CONTROLLED WITH YOUR NAUSEA MEDICATION  *UNUSUAL SHORTNESS OF BREATH  *UNUSUAL BRUISING OR BLEEDING  TENDERNESS IN MOUTH AND THROAT WITH OR WITHOUT PRESENCE OF ULCERS  *URINARY PROBLEMS  *BOWEL PROBLEMS  UNUSUAL RASH Items with * indicate a potential emergency and should be followed up as soon as possible.  Feel free to call the clinic you have any questions or concerns. The clinic phone number is [phone].  

## 2016-10-21 ENCOUNTER — Ambulatory Visit (HOSPITAL_BASED_OUTPATIENT_CLINIC_OR_DEPARTMENT_OTHER): Payer: Medicare Other

## 2016-10-21 ENCOUNTER — Ambulatory Visit (HOSPITAL_COMMUNITY)
Admission: RE | Admit: 2016-10-21 | Discharge: 2016-10-21 | Disposition: A | Discharge status: Home / Self Care | Payer: Medicare Other | Source: Ambulatory Visit | Attending: Family Medicine | Admitting: Family Medicine

## 2016-10-21 VITALS — BP 128/68 | HR 72 | Temp 97.4°F | Resp 18

## 2016-10-21 DIAGNOSIS — C3412 Malignant neoplasm of upper lobe, left bronchus or lung: Secondary | ICD-10-CM | POA: Diagnosis not present

## 2016-10-21 DIAGNOSIS — Z5189 Encounter for other specified aftercare: Secondary | ICD-10-CM | POA: Diagnosis not present

## 2016-10-21 DIAGNOSIS — T451X5A Adverse effect of antineoplastic and immunosuppressive drugs, initial encounter: Principal | ICD-10-CM

## 2016-10-21 DIAGNOSIS — C7802 Secondary malignant neoplasm of left lung: Secondary | ICD-10-CM

## 2016-10-21 DIAGNOSIS — D6481 Anemia due to antineoplastic chemotherapy: Secondary | ICD-10-CM | POA: Diagnosis not present

## 2016-10-21 DIAGNOSIS — C801 Malignant (primary) neoplasm, unspecified: Secondary | ICD-10-CM

## 2016-10-21 DIAGNOSIS — D4620 Refractory anemia with excess of blasts, unspecified: Secondary | ICD-10-CM

## 2016-10-21 MED ORDER — DIPHENHYDRAMINE HCL 25 MG PO CAPS
25.0000 mg | ORAL_CAPSULE | Freq: Once | ORAL | Status: AC
  Administered 2016-10-21: 25 mg via ORAL
  Filled 2016-10-21: qty 1

## 2016-10-21 MED ORDER — HEPARIN SOD (PORK) LOCK FLUSH 100 UNIT/ML IV SOLN: 250.0000 [IU] | INTRAVENOUS | Status: DC | PRN

## 2016-10-21 MED ORDER — SODIUM CHLORIDE 0.9% FLUSH
10.0000 mL | INTRAVENOUS | Status: AC | PRN
  Administered 2016-10-21: 10 mL

## 2016-10-21 MED ORDER — SODIUM CHLORIDE 0.9 % IV SOLN
250.0000 mL | Freq: Once | INTRAVENOUS | Status: AC
  Administered 2016-10-21: 250 mL via INTRAVENOUS

## 2016-10-21 MED ORDER — SODIUM CHLORIDE 0.9% FLUSH: 3.0000 mL | INTRAVENOUS | Status: DC | PRN

## 2016-10-21 MED ORDER — HEPARIN SOD (PORK) LOCK FLUSH 100 UNIT/ML IV SOLN
500.0000 [IU] | Freq: Every day | INTRAVENOUS | Status: AC | PRN
  Administered 2016-10-21: 500 [IU]
  Filled 2016-10-21: qty 5

## 2016-10-21 MED ORDER — ACETAMINOPHEN 325 MG PO TABS
650.0000 mg | ORAL_TABLET | Freq: Once | ORAL | Status: AC
  Administered 2016-10-21: 650 mg via ORAL
  Filled 2016-10-21: qty 2

## 2016-10-21 MED ORDER — PEGFILGRASTIM INJECTION 6 MG/0.6ML ~~LOC~~
6.0000 mg | PREFILLED_SYRINGE | Freq: Once | SUBCUTANEOUS | Status: AC
  Administered 2016-10-21: 6 mg via SUBCUTANEOUS
  Filled 2016-10-21: qty 0.6

## 2016-10-21 NOTE — Progress Notes (Signed)
Diagnosis Association: Anemia due to antineoplastic chemotherapy (D64.81 , T45.1X5A); MDS (myelodysplastic syndrome), low grade ([facility]) (D46.20)  Provider: Dr. Alvy Bimler  Procedure: Pt received 1 unit of PRBCs.  Pt tolerated the procedure well.  Post procedure: Pt received discharge instructions with verbal understanding. Pt was alert, oriented and ambulatory at discharge. D/C with wife.

## 2016-10-21 NOTE — Discharge Instructions (Signed)

## 2016-10-21 NOTE — Patient Instructions (Signed)
Pegfilgrastim injection What is this medicine? PEGFILGRASTIM (PEG fil gra stim) is a long-acting granulocyte colony-stimulating factor that stimulates the growth of neutrophils, a type of white blood cell important in the body's fight against infection. It is used to reduce the incidence of fever and infection in patients with certain types of cancer who are receiving chemotherapy that affects the bone marrow, and to increase survival after being exposed to high doses of radiation. This medicine may be used for other purposes; ask your health care provider or pharmacist if you have questions. COMMON BRAND NAME(S): Neulasta What should I tell my health care provider before I take this medicine? They need to know if you have any of these conditions: -kidney disease -latex allergy -ongoing radiation therapy -sickle cell disease -skin reactions to acrylic adhesives (On-Body Injector only) -an unusual or allergic reaction to pegfilgrastim, filgrastim, other medicines, foods, dyes, or preservatives -pregnant or trying to get pregnant -breast-feeding How should I use this medicine? This medicine is for injection under the skin. If you get this medicine at home, you will be taught how to prepare and give the pre-filled syringe or how to use the On-body Injector. Refer to the patient Instructions for Use for detailed instructions. Use exactly as directed. Tell your healthcare provider immediately if you suspect that the On-body Injector may not have performed as intended or if you suspect the use of the On-body Injector resulted in a missed or partial dose. It is important that you put your used needles and syringes in a special sharps container. Do not put them in a trash can. If you do not have a sharps container, call your pharmacist or healthcare provider to get one. Talk to your pediatrician regarding the use of this medicine in children. While this drug may be prescribed for selected conditions,  precautions do apply. Overdosage: If you think you have taken too much of this medicine contact a poison control center or emergency room at once. NOTE: This medicine is only for you. Do not share this medicine with others. What if I miss a dose? It is important not to miss your dose. Call your doctor or health care professional if you miss your dose. If you miss a dose due to an On-body Injector failure or leakage, a new dose should be administered as soon as possible using a single prefilled syringe for manual use. What may interact with this medicine? Interactions have not been studied. Give your health care provider a list of all the medicines, herbs, non-prescription drugs, or dietary supplements you use. Also tell them if you smoke, drink alcohol, or use illegal drugs. Some items may interact with your medicine. This list may not describe all possible interactions. Give your health care provider a list of all the medicines, herbs, non-prescription drugs, or dietary supplements you use. Also tell them if you smoke, drink alcohol, or use illegal drugs. Some items may interact with your medicine. What should I watch for while using this medicine? You may need blood work done while you are taking this medicine. If you are going to need a MRI, CT scan, or other procedure, tell your doctor that you are using this medicine (On-Body Injector only). What side effects may I notice from receiving this medicine? Side effects that you should report to your doctor or health care professional as soon as possible: -allergic reactions like skin rash, itching or hives, swelling of the face, lips, or tongue -dizziness -fever -pain, redness, or irritation at site   where injected -pinpoint red spots on the skin -red or dark-brown urine -shortness of breath or breathing problems -stomach or side pain, or pain at the shoulder -swelling -tiredness -trouble passing urine or change in the amount of urine Side  effects that usually do not require medical attention (report to your doctor or health care professional if they continue or are bothersome): -bone pain -muscle pain This list may not describe all possible side effects. Call your doctor for medical advice about side effects. You may report side effects to FDA at 1-800-FDA-1088. Where should I keep my medicine? Keep out of the reach of children. Store pre-filled syringes in a refrigerator between 2 and 8 degrees C (36 and 46 degrees F). Do not freeze. Keep in carton to protect from light. Throw away this medicine if it is left out of the refrigerator for more than 48 hours. Throw away any unused medicine after the expiration date. NOTE: This sheet is a summary. It may not cover all possible information. If you have questions about this medicine, talk to your doctor, pharmacist, or health care provider.  2018 Elsevier/Gold Standard (2016-04-21 12:58:03)  

## 2016-10-24 LAB — TYPE AND SCREEN
ABO/RH(D): O POS
ANTIBODY SCREEN: NEGATIVE
Donor AG Type: NEGATIVE
UNIT DIVISION: 0

## 2016-10-24 LAB — BPAM RBC
Blood Product Expiration Date: 201806302359
ISSUE DATE / TIME: 201806150939
Unit Type and Rh: 5100

## 2016-11-02 ENCOUNTER — Encounter: Payer: Self-pay | Admitting: Hematology and Oncology

## 2016-11-02 ENCOUNTER — Ambulatory Visit: Payer: Medicare Other

## 2016-11-02 ENCOUNTER — Ambulatory Visit (HOSPITAL_BASED_OUTPATIENT_CLINIC_OR_DEPARTMENT_OTHER): Payer: Medicare Other

## 2016-11-02 ENCOUNTER — Telehealth: Payer: Self-pay | Admitting: Hematology and Oncology

## 2016-11-02 ENCOUNTER — Ambulatory Visit (HOSPITAL_BASED_OUTPATIENT_CLINIC_OR_DEPARTMENT_OTHER): Payer: Medicare Other | Admitting: Hematology and Oncology

## 2016-11-02 ENCOUNTER — Other Ambulatory Visit (HOSPITAL_BASED_OUTPATIENT_CLINIC_OR_DEPARTMENT_OTHER): Payer: Medicare Other

## 2016-11-02 VITALS — BP 121/56 | HR 88 | Temp 97.6°F | Resp 17 | Ht 72.0 in | Wt 111.7 lb

## 2016-11-02 DIAGNOSIS — C3412 Malignant neoplasm of upper lobe, left bronchus or lung: Secondary | ICD-10-CM

## 2016-11-02 DIAGNOSIS — Z5111 Encounter for antineoplastic chemotherapy: Secondary | ICD-10-CM | POA: Diagnosis not present

## 2016-11-02 DIAGNOSIS — D4620 Refractory anemia with excess of blasts, unspecified: Secondary | ICD-10-CM

## 2016-11-02 DIAGNOSIS — D4621 Refractory anemia with excess of blasts 1: Secondary | ICD-10-CM

## 2016-11-02 DIAGNOSIS — C7802 Secondary malignant neoplasm of left lung: Secondary | ICD-10-CM

## 2016-11-02 DIAGNOSIS — C801 Malignant (primary) neoplasm, unspecified: Secondary | ICD-10-CM

## 2016-11-02 DIAGNOSIS — D61818 Other pancytopenia: Secondary | ICD-10-CM

## 2016-11-02 LAB — COMPREHENSIVE METABOLIC PANEL
ALT: <6 U/L (ref 0–55)
ANION GAP: 12 meq/L — AB (ref 3–11)
AST: 10 U/L (ref 5–34)
Albumin: 3.1 g/dL — ABNORMAL LOW (ref 3.5–5.0)
Alkaline Phosphatase: 98 U/L (ref 40–150)
BUN: 20.8 mg/dL (ref 7.0–26.0)
CALCIUM: 9 mg/dL (ref 8.4–10.4)
CHLORIDE: 110 meq/L — AB (ref 98–109)
CO2: 19 mEq/L — ABNORMAL LOW (ref 22–29)
CREATININE: 0.9 mg/dL (ref 0.7–1.3)
Glucose: 89 mg/dl (ref 70–140)
POTASSIUM: 4.3 meq/L (ref 3.5–5.1)
Sodium: 141 mEq/L (ref 136–145)
Total Bilirubin: 0.41 mg/dL (ref 0.20–1.20)
Total Protein: 8 g/dL (ref 6.4–8.3)

## 2016-11-02 LAB — CBC WITH DIFFERENTIAL/PLATELET
BASO%: 0.5 % (ref 0.0–2.0)
BASOS ABS: 0.1 10*3/uL (ref 0.0–0.1)
EOS%: 0.3 % (ref 0.0–7.0)
Eosinophils Absolute: 0 10*3/uL (ref 0.0–0.5)
HEMATOCRIT: 26.9 % — AB (ref 38.4–49.9)
HGB: 9 g/dL — ABNORMAL LOW (ref 13.0–17.1)
LYMPH#: 0.5 10*3/uL — AB (ref 0.9–3.3)
LYMPH%: 4.6 % — AB (ref 14.0–49.0)
MCH: 35.8 pg — AB (ref 27.2–33.4)
MCHC: 33.3 g/dL (ref 32.0–36.0)
MCV: 107.6 fL — ABNORMAL HIGH (ref 79.3–98.0)
MONO#: 0.7 10*3/uL (ref 0.1–0.9)
MONO%: 6.3 % (ref 0.0–14.0)
NEUT#: 10.1 10*3/uL — ABNORMAL HIGH (ref 1.5–6.5)
NEUT%: 88.3 % — AB (ref 39.0–75.0)
PLATELETS: 265 10*3/uL (ref 140–400)
RBC: 2.5 10*6/uL — AB (ref 4.20–5.82)
RDW: 24.8 % — ABNORMAL HIGH (ref 11.0–14.6)
WBC: 11.4 10*3/uL — ABNORMAL HIGH (ref 4.0–10.3)

## 2016-11-02 MED ORDER — FAMOTIDINE IN NACL 20-0.9 MG/50ML-% IV SOLN
20.0000 mg | Freq: Once | INTRAVENOUS | Status: AC
  Administered 2016-11-02: 20 mg via INTRAVENOUS

## 2016-11-02 MED ORDER — SODIUM CHLORIDE 0.9% FLUSH
10.0000 mL | Freq: Once | INTRAVENOUS | Status: AC
  Administered 2016-11-02: 10 mL
  Filled 2016-11-02: qty 10

## 2016-11-02 MED ORDER — SODIUM CHLORIDE 0.9 % IV SOLN
20.0000 mg | Freq: Once | INTRAVENOUS | Status: AC
  Administered 2016-11-02: 20 mg via INTRAVENOUS
  Filled 2016-11-02: qty 2

## 2016-11-02 MED ORDER — PALONOSETRON HCL INJECTION 0.25 MG/5ML
0.2500 mg | Freq: Once | INTRAVENOUS | Status: AC
  Administered 2016-11-02: 0.25 mg via INTRAVENOUS

## 2016-11-02 MED ORDER — DARBEPOETIN ALFA 500 MCG/ML IJ SOSY
500.0000 ug | PREFILLED_SYRINGE | Freq: Once | INTRAMUSCULAR | Status: AC
  Administered 2016-11-02: 500 ug via SUBCUTANEOUS
  Filled 2016-11-02: qty 1

## 2016-11-02 MED ORDER — HEPARIN SOD (PORK) LOCK FLUSH 100 UNIT/ML IV SOLN
500.0000 [IU] | Freq: Once | INTRAVENOUS | Status: AC | PRN
  Administered 2016-11-02: 500 [IU]
  Filled 2016-11-02: qty 5

## 2016-11-02 MED ORDER — SODIUM CHLORIDE 0.9 % IV SOLN
153.4000 mg | Freq: Once | INTRAVENOUS | Status: AC
  Administered 2016-11-02: 150 mg via INTRAVENOUS
  Filled 2016-11-02: qty 15

## 2016-11-02 MED ORDER — PALONOSETRON HCL INJECTION 0.25 MG/5ML
INTRAVENOUS | Status: AC
  Filled 2016-11-02: qty 5

## 2016-11-02 MED ORDER — SODIUM CHLORIDE 0.9 % IV SOLN
Freq: Once | INTRAVENOUS | Status: AC
  Administered 2016-11-02: 09:00:00 via INTRAVENOUS

## 2016-11-02 MED ORDER — FAMOTIDINE IN NACL 20-0.9 MG/50ML-% IV SOLN
INTRAVENOUS | Status: AC
  Filled 2016-11-02: qty 50

## 2016-11-02 MED ORDER — PACLITAXEL CHEMO INJECTION 300 MG/50ML
45.0000 mg/m2 | Freq: Once | INTRAVENOUS | Status: AC
  Administered 2016-11-02: 72 mg via INTRAVENOUS
  Filled 2016-11-02: qty 12

## 2016-11-02 MED ORDER — DIPHENHYDRAMINE HCL 50 MG/ML IJ SOLN
50.0000 mg | Freq: Once | INTRAMUSCULAR | Status: AC
  Administered 2016-11-02: 50 mg via INTRAVENOUS

## 2016-11-02 MED ORDER — DIPHENHYDRAMINE HCL 50 MG/ML IJ SOLN
INTRAMUSCULAR | Status: AC
  Filled 2016-11-02: qty 1

## 2016-11-02 MED ORDER — SODIUM CHLORIDE 0.9% FLUSH
10.0000 mL | INTRAVENOUS | Status: DC | PRN
  Administered 2016-11-02: 10 mL
  Filled 2016-11-02: qty 10

## 2016-11-02 NOTE — Telephone Encounter (Signed)
Scheduled appt per 6/27 los - Gave patient AVS and calender per los.

## 2016-11-02 NOTE — Assessment & Plan Note (Signed)
He tolerated treatment very well with no side effects from treatment He will continue treatment without dose adjustment He will continue to get coverage for G-CSF, blood transfusion and Aranesp for MDS while on treatment After his treatment next week, I plan to repeat PET CT scan in mid July to assess response to treatment

## 2016-11-02 NOTE — Patient Instructions (Signed)
[facility] Discharge Instructions for Patients Receiving Chemotherapy  Today you received the following chemotherapy agents taxol/carboplatin  To help prevent nausea and vomiting after your treatment, we encourage you to take your nausea medication as directed   If you develop nausea and vomiting that is not controlled by your nausea medication, call the clinic.   BELOW ARE SYMPTOMS THAT SHOULD BE REPORTED IMMEDIATELY:  *FEVER GREATER THAN 100.5 F  *CHILLS WITH OR WITHOUT FEVER  NAUSEA AND VOMITING THAT IS NOT CONTROLLED WITH YOUR NAUSEA MEDICATION  *UNUSUAL SHORTNESS OF BREATH  *UNUSUAL BRUISING OR BLEEDING  TENDERNESS IN MOUTH AND THROAT WITH OR WITHOUT PRESENCE OF ULCERS  *URINARY PROBLEMS  *BOWEL PROBLEMS  UNUSUAL RASH Items with * indicate a potential emergency and should be followed up as soon as possible.  Feel free to call the clinic you have any questions or concerns. The clinic phone number is [phone].  

## 2016-11-02 NOTE — Assessment & Plan Note (Signed)
The anemia from MDS is stable. I suspect he may have some degree of bone marrow suppression from his antiseizure medications. He had responded well to Darbopeitin in the past with improvement in energy level.  The patient will get regular blood work done. The patient will continue on Aranep injection to keep hemoglobin greater than 10 g. He will continue to get darbepoetin injection while on treatment, scheduled to be given every 2 weeks. He had recently received blood transfusion with symptomatic improvement.  He will continue to receive blood if hemoglobin is less than 8

## 2016-11-02 NOTE — Progress Notes (Signed)
[place] OFFICE PROGRESS NOTE  Patient Care Team: [name], MD as PCP - General (Family Medicine) [name], MD as Consulting Physician (Hematology and Oncology)  SUMMARY OF ONCOLOGIC HISTORY: Oncology History   MDS, R-IPSS score of 3, low risk (Hg 8.9, +16 chromosome on BM, 2% blast count)   Squamous cell carcinoma of the epiglottis   Primary site: Larynx - Supraglottis (Left)   Staging method: AJCC 7th Edition   Clinical: Stage I (T1, N0, M0) signed by [name], MD on 04/30/2013 12:49 PM   Pathologic: Stage I (T1, N0, cM0) signed by [name], MD on 04/30/2013 12:49 PM   Summary: Stage I (T1, N0, cM0)       MDS (myelodysplastic syndrome), low grade ([place])   02/01/2007 Bone Marrow Biopsy    BM biopsy was abnormal, overall probable low grade MDS      03/23/2011 - 02/06/2013 Chemotherapy    [name], discontinued due to diagnosis of laryngeal ca      09/06/2013 Bone Marrow Biopsy    Repeat bone marrow aspirate and biopsy confirmed low-grade myelodysplastic syndrome.      09/17/2013 -  Chemotherapy    The patient resumed darbepoetin injections to treat the anemia.       History of head and neck cancer   03/06/2013 Procedure    Biopsy from epiglottic region showed invasive [facility]      04/25/2013 Imaging    Ct scan showed no other involvement in the LN      05/13/2013 - 06/28/2013 Radiation Therapy    He received radiation therapy, 70 gray in 35 fractions to the larynx      06/09/2016 Imaging    CT scan of the chest showed Bilateral spiculated soft tissue pulmonary masses, highly suspicious for multifocal malignancy. Borderline enlarged left-sided mediastinal lymphadenopathy. Background of severe emphysematous changes, chronic cylindrical bronchiectasis and hyperinflation of the lungs. Findings consistent with longstanding COPD. Diffusely thickened interstitial markings. This may represent chronic interstitial lung changes, however  lymphangitic spread of malignancy cannot be excluded. Anterior compression deformities of several of the thoracic vertebral bodies, with heterogeneous appearance of T7 and T9 vertebral body. This may represent degenerative changes, however osseous metastatic disease cannot be excluded.       07/11/2016 PET scan    There are 2 nodules in the left upper lobe and 2 nodules within the right lower lobe which have spiculated margin and exhibit intense radiotracer uptake compatible with multifocal pulmonary metastasis versus multiple synchronous primary pulmonary neoplasms. 2. Emphysema 3. Aortic atherosclerosis and multi vessel coronary artery calcification.       Cancer of upper lobe of left lung ([place])   06/09/2016 Imaging    CT chest: Bilateral spiculated soft tissue pulmonary masses, highly suspicious for multifocal malignancy. Borderline enlarged left-sided mediastinal lymphadenopathy. Background of severe emphysematous changes, chronic cylindrical bronchiectasis and hyperinflation of the lungs. Findings consistent with longstanding COPD. Diffusely thickened interstitial markings. This may represent chronic interstitial lung changes, however lymphangitic spread of malignancy cannot be excluded. Anterior compression deformities of several of the thoracic vertebral bodies, with heterogeneous appearance of T7 and T9 vertebral body. This may represent degenerative changes, however osseous metastatic disease cannot be excluded.       06/29/2016 Imaging    MRI brain: No evidence of metastatic disease or recent infarction. Advanced generalized brain atrophy with moderate to marked chronic small-vessel ischemic changes throughout. FLAIR imaging appears to show scattered gyri showing FLAIR hyperintensity. These abnormalities are not  confirmed with restricted diffusion or contrast enhancement. Therefore, we are not certain that this is not artifactual. The differential diagnosis does include true pathology such  as posterior reversible encephalopathy, viral or prion disease, postictal change in post chemotherapy change. If there is concern about active CNS pathology, re- scanning in 4-6 weeks could be useful.      07/27/2016 Imaging    CT chest: Bilateral pulmonary lesions. These are suspicious for metastatic disease. Lesions have minimally changed but there may be slight enlargement of the cavitary lesion in the right lower lobe. Severe emphysematous changes.       08/10/2016 Pathology Results    1. Lung, biopsy, RUL, (apical segment) - SCANT BENIGN LUNG PARENCHYMA. - THERE IS NO EVIDENCE OF MALIGNANCY. 2. Lung, biopsy, LUL lingula - SQUAMOUS CELL CARCINOMA. - SEE COMMENT. 3. Lung, biopsy, LUL - ADENOCARCINOMA. - SEE COMMENT. Microscopic Comment 2. The malignant cells are positive for cytokeratin 56 and p63. They are negative for TTF-1. The findings are consistent with squamous cell carcinoma. There is likely insufficient tissue remaining for additional studies, if requested. 3. The malignant cells are positive for TTF-1 and negative for p63 and cytokeratin 5/6. The findings are consistent with adenocarcinoma.      08/10/2016 Procedure    The targets were defined as follows: Left upper lobe nodule, target 1 Lingular nodule, target 2 Proximal right lower lobe nodule, target 3 More distal right lower lobe, nodule  The extendable working channel was secured into place and the locator guide was withdrawn. Under fluoroscopic guidance transbronchial needle brushings, transbronchial Wang needle biopsies, and transbronchial forceps biopsies were performed in the LUL (target 1) and the lingula (target 2) to be sent for cytology and pathology. A bronchioalveolar lavage was performed in the lingula in the vicinity of target 2  and sent for cytology and microbiology (bacterial, fungal, AFB smears and cultures).  Fiducial markers were then placed around target 1, target 2, and in the right lower lobe in the  vicinity of both target 3 and target 4 to be used for possible radiation therapy should this be indicated. At the end of the procedure a general airway inspection was performed and there was no evidence of active bleeding. The bronchoscope was removed.  The patient tolerated the procedure well. There was no significant blood loss and there were no obvious complications. A post-procedural chest x-ray is pending.  Samples: 1. Transbronchial needle brushings from targets 1 and 2 2. Transbronchial Wang needle biopsies from targets 1 and 2 3. Transbronchial forceps biopsies from targets 1 and 2 4. Bronchoalveolar lavage from lingula, target 2 5. Endobronchial biopsies from RUL apical segment 6. Endobronchial brushings from right upper lobe apical segment       08/10/2016 Genetic Testing    Patient has genetic testing done for [employer] and PD-L1 testing Results revealed PD-L1 testing is negative 0%      08/31/2016 Procedure    Technically successful right IJ power-injectable port catheter placement. Ready for routine use       INTERVAL HISTORY: Please see below for problem oriented charting. He returns for further follow-up with his wife He tolerated chemotherapy very well Denies side effects such as infection, neuropathy, nausea or vomiting. The patient denies any recent signs or symptoms of bleeding such as spontaneous epistaxis, hematuria or hematochezia. The patient has quit smoking recently  REVIEW OF SYSTEMS:   Constitutional: Denies fevers, chills or abnormal weight loss Eyes: Denies blurriness of vision Ears, nose, mouth, throat, and  face: Denies mucositis or sore throat Respiratory: Denies cough, dyspnea or wheezes Cardiovascular: Denies palpitation, chest discomfort or lower extremity swelling Gastrointestinal:  Denies nausea, heartburn or change in bowel habits Skin: Denies abnormal skin rashes Lymphatics: Denies new lymphadenopathy or easy  bruising Neurological:Denies numbness, tingling or new weaknesses Behavioral/Psych: Mood is stable, no new changes  All other systems were reviewed with the patient and are negative.  I have reviewed the past medical history, past surgical history, social history and family history with the patient and they are unchanged from previous note.  ALLERGIES:  is allergic to no known allergies.  MEDICATIONS:  Current Outpatient Prescriptions  Medication Sig Dispense Refill  . budesonide-formoterol (SYMBICORT) 80-4.5 MCG/ACT inhaler Inhale 2 puffs into the lungs 2 (two) times daily as needed (Short of breath).    . cyanocobalamin 500 MCG tablet Take 500 mcg by mouth daily.    . diazepam (VALIUM) 5 MG tablet Take 5 mg by mouth every 6 (six) hours as needed for anxiety.     . ferrous sulfate 325 (65 FE) MG tablet Take 325 mg by mouth daily with breakfast.    . folic acid (FOLVITE) 1 MG tablet Take 1 mg by mouth daily.    Marland Kitchen levETIRAcetam (KEPPRA) 500 MG tablet Take 1 tablet (500 mg total) by mouth 2 (two) times daily. 60 tablet 11  . lidocaine-prilocaine (EMLA) cream Apply to affected area once 30 g 3  . mometasone (NASONEX) 50 MCG/ACT nasal spray Place 2 sprays into both nostrils daily.    . ondansetron (ZOFRAN) 8 MG tablet Take 1 tablet (8 mg total) by mouth 2 (two) times daily as needed for refractory nausea / vomiting. Start on day 3 after chemo. (Patient not taking: Reported on 09/21/2016) 30 tablet 1  . prochlorperazine (COMPAZINE) 10 MG tablet Take 1 tablet (10 mg total) by mouth every 6 (six) hours as needed (Nausea or vomiting). (Patient not taking: Reported on 09/21/2016) 30 tablet 1  . vitamin E 1000 UNIT capsule Take 1,000 Units by mouth daily.      No current facility-administered medications for this visit.     PHYSICAL EXAMINATION: ECOG PERFORMANCE STATUS: 1 - Symptomatic but completely ambulatory  Vitals:   11/02/16 0901  BP: (!) 121/56  Pulse: 88  Resp: 17  Temp: 97.6 F (36.4  C)   Filed Weights   11/02/16 0901  Weight: 111 lb 11.2 oz (50.7 kg)    GENERAL:alert, no distress and comfortable.  He looks thin and cachectic SKIN: skin color, texture, turgor are normal, no rashes or significant lesions EYES: normal, Conjunctiva are pink and non-injected, sclera clear OROPHARYNX:no exudate, no erythema and lips, buccal mucosa, and tongue normal  NECK: supple, thyroid normal size, non-tender, without nodularity LYMPH:  no palpable lymphadenopathy in the cervical, axillary or inguinal LUNGS: clear to auscultation and percussion with normal breathing effort HEART: regular rate & rhythm and no murmurs and no lower extremity edema ABDOMEN:abdomen soft, non-tender and normal bowel sounds Musculoskeletal:no cyanosis of digits and no clubbing  NEURO: alert & oriented x 3 with fluent speech, no focal motor/sensory deficits  LABORATORY DATA:  I have reviewed the data as listed    Component Value Date/Time   NA 141 11/02/2016 0836   K 4.3 11/02/2016 0836   CL 108 08/31/2016 1155   CL 107 09/19/2012 1325   CO2 19 (L) 11/02/2016 0836   GLUCOSE 89 11/02/2016 0836   GLUCOSE 97 09/19/2012 1325   BUN 20.8 11/02/2016 0836  CREATININE 0.9 11/02/2016 0836   CALCIUM 9.0 11/02/2016 0836   PROT 8.0 11/02/2016 0836   ALBUMIN 3.1 (L) 11/02/2016 0836   AST 10 11/02/2016 0836   ALT <6 11/02/2016 0836   ALKPHOS 98 11/02/2016 0836   BILITOT 0.41 11/02/2016 0836   GFRNONAA >60 08/31/2016 1155   GFRAA >60 08/31/2016 1155    No results found for: SPEP, UPEP  Lab Results  Component Value Date   WBC 11.4 (H) 11/02/2016   NEUTROABS 10.1 (H) 11/02/2016   HGB 9.0 (L) 11/02/2016   HCT 26.9 (L) 11/02/2016   MCV 107.6 (H) 11/02/2016   PLT 265 11/02/2016      Chemistry      Component Value Date/Time   NA 141 11/02/2016 0836   K 4.3 11/02/2016 0836   CL 108 08/31/2016 1155   CL 107 09/19/2012 1325   CO2 19 (L) 11/02/2016 0836   BUN 20.8 11/02/2016 0836   CREATININE 0.9  11/02/2016 0836      Component Value Date/Time   CALCIUM 9.0 11/02/2016 0836   ALKPHOS 98 11/02/2016 0836   AST 10 11/02/2016 0836   ALT <6 11/02/2016 0836   BILITOT 0.41 11/02/2016 0836     ASSESSMENT & PLAN:  Cancer of upper lobe of left lung ([place]) He tolerated treatment very well with no side effects from treatment He will continue treatment without dose adjustment He will continue to get coverage for G-CSF, blood transfusion and Aranesp for MDS while on treatment After his treatment next week, I plan to repeat PET CT scan in mid July to assess response to treatment  MDS (myelodysplastic syndrome), low grade The anemia from MDS is stable. I suspect he may have some degree of bone marrow suppression from his antiseizure medications. He had responded well to Darbopeitin in the past with improvement in energy level.  The patient will get regular blood work done. The patient will continue on Aranep injection to keep hemoglobin greater than 10 g. He will continue to get darbepoetin injection while on treatment, scheduled to be given every 2 weeks. He had recently received blood transfusion with symptomatic improvement.  He will continue to receive blood if hemoglobin is less than 8   Orders Placed This Encounter  Procedures  . NM PET Image Restag (PS) Skull Base To Thigh    Standing Status:   Future    Standing Expiration Date:   01/02/2018    Order Specific Question:   Reason for Exam (SYMPTOM  OR DIAGNOSIS REQUIRED)    Answer:   staging lung ca, assess response to Rx    Order Specific Question:   Preferred imaging location?    Answer:   [facility]   All questions were answered. The patient knows to call the clinic with any problems, questions or concerns. No barriers to learning was detected. I spent 15 minutes counseling the patient face to face. The total time spent in the appointment was 20 minutes and more than 50% was on counseling and review of test results      [name], MD 11/02/2016 9:49 AM

## 2016-11-04 ENCOUNTER — Ambulatory Visit (INDEPENDENT_AMBULATORY_CARE_PROVIDER_SITE_OTHER): Payer: Medicare Other | Admitting: Emergency Medicine

## 2016-11-04 ENCOUNTER — Encounter: Payer: Self-pay | Admitting: Emergency Medicine

## 2016-11-04 VITALS — BP 100/62 | HR 89 | Ht 72.0 in | Wt 110.0 lb

## 2016-11-04 DIAGNOSIS — J449 Chronic obstructive pulmonary disease, unspecified: Secondary | ICD-10-CM

## 2016-11-04 MED ORDER — GLYCOPYRROLATE-FORMOTEROL 9-4.8 MCG/ACT IN AERO: 2.0000 | INHALATION_SPRAY | Freq: Two times a day (BID) | RESPIRATORY_TRACT | 0 refills | Status: AC

## 2016-11-04 MED ORDER — ALBUTEROL SULFATE HFA 108 (90 BASE) MCG/ACT IN AERS: 2.0000 | INHALATION_SPRAY | RESPIRATORY_TRACT | 5 refills | Status: AC | PRN

## 2016-11-04 NOTE — Patient Instructions (Signed)
We will temporarily stop Symbicort to try an alternative Start Bevespi 2 puffs twice a day. If you prefer this to the Symbicort then we will work on ordering through your pharmacy. Call us to let us know.  Congratulations on quitting smoking.  Get you repeat CT chest in July as planned with Dr Alvy Bimler.  Follow with Dr Lamonte Sakai in 6 months or sooner if you have any problems

## 2016-11-04 NOTE — Assessment & Plan Note (Signed)
He was able to stop smoking. Congratulated him on this. We will do a trial of Bevespi for Symbicort to see if he benefits. If so he will call and we will order.  Start albuterol when necessary

## 2016-11-04 NOTE — Assessment & Plan Note (Signed)
Currently on chemotherapy. He is planning for a repeat CT scan of the chest in mid July. Following with Dr. Alvy Bimler.

## 2016-11-04 NOTE — Progress Notes (Signed)
Patient seen in the office today and instructed on use of Bevespi inhaler.  Patient expressed understanding and demonstrated technique.

## 2016-11-04 NOTE — Progress Notes (Signed)
Subjective:    Patient ID: Adrian [name], male    DOB: December 23, 1950, 66 y.o.   MRN: [id number]  HPI 66 year old active smoker with a history of laryngeal CA, followed by Dr Alvy Bimler. He has a CT chest and then a PET that show B pulmonary nodules in the left upper lobe and the RLL that are hypermetabolic, superimposed on severe emphysema. He was referred for needle bx, but this was deferred due to PTX risk. He presents now for consideration of ENB. Currently on Symbicort. Pred 5mg  daily > he isn't sure why. Smokes 2 cigarettes a day. No cough, no hemoptysis. Occasional DOE./   ROV 11/04/16 -- This follow-up visit for patient with history tobacco use, laryngeal cancer, bilateral pulmonary nodules. He underwent navigational bronchoscopy that identified 2 distinct primaries, one adenocarcinoma and one squamous cell carcinoma. He is following with Dr Alvy Bimler. He is on weekly chemotherapy, just completed treatment #5. Has had a low appetite, no other sx. He is on Symbicort, feels that he benefits. He is able to exert, occasionally DOE, occasionally has to stop to rest w walking. He stopped smoking 2 weeks ago. Planning for a repeat Ct chest mid-July.    Review of Systems  Constitutional: Positive for appetite change and unexpected weight change. Negative for fever.  HENT: Negative for congestion, dental problem, ear pain, nosebleeds, postnasal drip, rhinorrhea, sinus pressure, sneezing, sore throat and trouble swallowing.   Eyes: Negative for redness and itching.  Respiratory: Positive for cough and shortness of breath. Negative for chest tightness and wheezing.   Cardiovascular: Negative for palpitations and leg swelling.  Gastrointestinal: Negative for nausea and vomiting.  Genitourinary: Negative for dysuria.  Musculoskeletal: Negative for joint swelling.  Skin: Negative for rash.  Neurological: Negative for headaches.  Hematological: Does not bruise/bleed easily.  Psychiatric/Behavioral:  Negative for dysphoric mood. The patient is nervous/anxious.    Past Medical History:  Diagnosis Date  . Anxiety   . Arthritis    rheumatoid  . Cancer ([place]) 03/11/2013   larnyx/epiglottic Squamous cell in situ  . Emphysema of lung ([place])   . Esophageal reflux    not current  . History of blood transfusion   . History of radiation therapy 05/13/2013-06/28/2013   70 gray to epiglottis/neck  . Megaloblastic anemia   . Pneumonia 01/2013  . Seizures ([place])    last one was 8 years ago- 2008  . Shortness of breath    with exertion  . Throat pain 06/13/2013     Family History  Problem Relation Age of Onset  . Hypertension Mother   . Arthritis/Rheumatoid Mother   . Stroke Mother      Social History   Social History  . Marital status: Married    Spouse name: Hassan Rowan  . Number of children: 3  . Years of education: 12th   Occupational History  . Retired Other   Social History Main Topics  . Smoking status: Former Smoker    Packs/day: 0.12    Years: 40.00    Types: Cigarettes    Quit date: 10/04/2016  . Smokeless tobacco: Never Used     Comment: smoke 1 to 2 a day  . Alcohol use No  . Drug use: No  . Sexual activity: Not on file   Other Topics Concern  . Not on file   Social History Narrative   04/12/2013   The patient is married with 3 children. (2 girls and one boy)    The patient has a  history of smoking a quarter pack per day for 15 years.   The patient is trying to quit on his own.   Patient has not had any alcohol for the past 6 months by report. Patient usually drinks vodka.   Caffeine Use: 2 cups daily              Allergies  Allergen Reactions  . No Known Allergies      Outpatient Medications Prior to Visit  Medication Sig Dispense Refill  . cyanocobalamin 500 MCG tablet Take 500 mcg by mouth daily.    . ferrous sulfate 325 (65 FE) MG tablet Take 325 mg by mouth daily with breakfast.    . folic acid (FOLVITE) 1 MG tablet Take 1 mg by mouth daily.    Marland Kitchen  levETIRAcetam (KEPPRA) 500 MG tablet Take 1 tablet (500 mg total) by mouth 2 (two) times daily. 60 tablet 11  . lidocaine-prilocaine (EMLA) cream Apply to affected area once 30 g 3  . ondansetron (ZOFRAN) 8 MG tablet Take 1 tablet (8 mg total) by mouth 2 (two) times daily as needed for refractory nausea / vomiting. Start on day 3 after chemo. 30 tablet 1  . vitamin E 1000 UNIT capsule Take 1,000 Units by mouth daily.     . budesonide-formoterol (SYMBICORT) 80-4.5 MCG/ACT inhaler Inhale 2 puffs into the lungs 2 (two) times daily as needed (Short of breath).    . diazepam (VALIUM) 5 MG tablet Take 5 mg by mouth every 6 (six) hours as needed for anxiety.     . mometasone (NASONEX) 50 MCG/ACT nasal spray Place 2 sprays into both nostrils daily.    . prochlorperazine (COMPAZINE) 10 MG tablet Take 1 tablet (10 mg total) by mouth every 6 (six) hours as needed (Nausea or vomiting). (Patient not taking: Reported on 09/21/2016) 30 tablet 1   No facility-administered medications prior to visit.    CBC    Component Value Date/Time   WBC 11.4 (H) 11/02/2016 0836   WBC 3.7 (L) 08/31/2016 1155   RBC 2.50 (L) 11/02/2016 0836   RBC 2.43 (L) 08/31/2016 1155   HGB 9.0 (L) 11/02/2016 0836   HCT 26.9 (L) 11/02/2016 0836   PLT 265 11/02/2016 0836   MCV 107.6 (H) 11/02/2016 0836   MCH 35.8 (H) 11/02/2016 0836   MCH 37.4 (H) 08/31/2016 1155   MCHC 33.3 11/02/2016 0836   MCHC 31.9 08/31/2016 1155   RDW 24.8 (H) 11/02/2016 0836   LYMPHSABS 0.5 (L) 11/02/2016 0836   MONOABS 0.7 11/02/2016 0836   EOSABS 0.0 11/02/2016 0836   BASOSABS 0.1 11/02/2016 0836        Objective:   Physical Exam Vitals:   11/04/16 1017  BP: 100/62  Pulse: 89  SpO2: 97%  Weight: 110 lb (49.9 kg)  Height: 6' (1.829 m)   Gen: Pleasant, thin man, in NAD, smells of cigarettes  ENT: no dysarthria, Op clear  Neck: No JVD, no stridor  Lungs: No use of accessory muscles, distant but clear, no wheeze  Cardiovascular: RRR, no  M  Musculoskeletal: No deformities, no cyanosis or clubbing  Neuro: alert, non focal  Skin: Warm, no lesions or rashes   CT chest 08/06/16 --  EXAM: CT CHEST WITHOUT CONTRAST  TECHNIQUE: Multidetector CT imaging of the chest was performed following the standard protocol without IV contrast.  COMPARISON:  PET-CT 07/11/2016.  Chest CT October 13, 202018  FINDINGS: Cardiovascular: Coronary arteries demonstrate atherosclerotic calcifications. Heart size is within normal limits without  significant pericardial fluid. No significant enlargement of the thoracic aorta.  Mediastinum/Nodes: No significant chest lymphadenopathy but limited evaluation on this noncontrast examination. No significant axillary lymphadenopathy.  Lungs/Pleura: There is diffuse centrilobular emphysema. Probable focal scarring in the posterior right apex. Again noted are scattered pulmonary lesions concerning for metastatic neoplasms. The spiculated lesion in the lateral left upper lobe measures up to 2.5 cm and minimally changed. Again noted is a large irregular lesion in the lingula with a solitary calcification. This lesion measures roughly 3.6 cm. There is volume loss in left lower lobe. Again noted is a cavitary lesion along the periphery of the right lower lobe measuring roughly 1.7 cm and previously measured 1.5 cm. Again noted is a spiculated lesion in the superior segment of the right lower lobe measuring up to 2.1 cm and minimally changed.  Upper Abdomen: Again noted is a cystic structure in left kidney upper pole measuring 2.3 cm. No acute abnormality in the upper abdomen.  Musculoskeletal: No acute bone abnormality.  IMPRESSION: Bilateral pulmonary lesions. These are suspicious for metastatic disease. Lesions have minimally changed but there may be slight enlargement of the cavitary lesion in the right lower lobe.  Severe emphysematous changes.       Assessment & Plan:  COPD  (chronic obstructive pulmonary disease) He was able to stop smoking. Congratulated him on this. We will do a trial of Bevespi for Symbicort to see if he benefits. If so he will call and we will order.  Start albuterol when necessary  Cancer of upper lobe of left lung ([place]) Currently on chemotherapy. He is planning for a repeat CT scan of the chest in mid July. Following with Dr. Alvy Bimler.   Baltazar Apo, MD, PhD 11/04/2016, 10:54 AM [facility] [phone] or if no answer [phone]

## 2016-11-11 ENCOUNTER — Ambulatory Visit (HOSPITAL_COMMUNITY)
Admission: RE | Admit: 2016-11-11 | Discharge: 2016-11-11 | Disposition: A | Discharge status: Home / Self Care | Payer: Medicare Other | Source: Ambulatory Visit | Attending: Hematology and Oncology | Admitting: Hematology and Oncology

## 2016-11-11 ENCOUNTER — Ambulatory Visit (HOSPITAL_BASED_OUTPATIENT_CLINIC_OR_DEPARTMENT_OTHER): Payer: Medicare Other

## 2016-11-11 ENCOUNTER — Other Ambulatory Visit: Payer: Self-pay | Admitting: Hematology and Oncology

## 2016-11-11 ENCOUNTER — Other Ambulatory Visit (HOSPITAL_BASED_OUTPATIENT_CLINIC_OR_DEPARTMENT_OTHER): Payer: Medicare Other

## 2016-11-11 VITALS — BP 148/68 | HR 66 | Temp 97.2°F | Resp 17

## 2016-11-11 DIAGNOSIS — D4620 Refractory anemia with excess of blasts, unspecified: Secondary | ICD-10-CM

## 2016-11-11 DIAGNOSIS — C801 Malignant (primary) neoplasm, unspecified: Secondary | ICD-10-CM

## 2016-11-11 DIAGNOSIS — C7801 Secondary malignant neoplasm of right lung: Secondary | ICD-10-CM

## 2016-11-11 DIAGNOSIS — C3412 Malignant neoplasm of upper lobe, left bronchus or lung: Secondary | ICD-10-CM | POA: Diagnosis not present

## 2016-11-11 DIAGNOSIS — D46Z Other myelodysplastic syndromes: Secondary | ICD-10-CM

## 2016-11-11 DIAGNOSIS — C7802 Secondary malignant neoplasm of left lung: Secondary | ICD-10-CM

## 2016-11-11 DIAGNOSIS — Z5111 Encounter for antineoplastic chemotherapy: Secondary | ICD-10-CM | POA: Diagnosis not present

## 2016-11-11 LAB — COMPREHENSIVE METABOLIC PANEL
ANION GAP: 7 meq/L (ref 3–11)
AST: 11 U/L (ref 5–34)
Albumin: 3 g/dL — ABNORMAL LOW (ref 3.5–5.0)
Alkaline Phosphatase: 88 U/L (ref 40–150)
BUN: 18.5 mg/dL (ref 7.0–26.0)
CALCIUM: 8.9 mg/dL (ref 8.4–10.4)
CHLORIDE: 110 meq/L — AB (ref 98–109)
CO2: 20 meq/L — AB (ref 22–29)
Creatinine: 0.8 mg/dL (ref 0.7–1.3)
EGFR: >90 mL/min/{1.73_m2} (ref >90)
Glucose: 88 mg/dl (ref 70–140)
POTASSIUM: 4.6 meq/L (ref 3.5–5.1)
Sodium: 138 mEq/L (ref 136–145)
Total Bilirubin: 0.37 mg/dL (ref 0.20–1.20)
Total Protein: 7.7 g/dL (ref 6.4–8.3)

## 2016-11-11 LAB — CBC WITH DIFFERENTIAL/PLATELET
BASO%: 1 % (ref 0.0–2.0)
BASOS ABS: 0.1 10*3/uL (ref 0.0–0.1)
EOS%: 0.2 % (ref 0.0–7.0)
Eosinophils Absolute: 0 10*3/uL (ref 0.0–0.5)
HEMATOCRIT: 24.7 % — AB (ref 38.4–49.9)
HGB: 7.8 g/dL — ABNORMAL LOW (ref 13.0–17.1)
LYMPH#: 0.5 10*3/uL — AB (ref 0.9–3.3)
LYMPH%: 8.9 % — AB (ref 14.0–49.0)
MCH: 33.9 pg — AB (ref 27.2–33.4)
MCHC: 31.6 g/dL — AB (ref 32.0–36.0)
MCV: 107.4 fL — ABNORMAL HIGH (ref 79.3–98.0)
MONO#: 0.5 10*3/uL (ref 0.1–0.9)
MONO%: 7.9 % (ref 0.0–14.0)
NEUT#: 5 10*3/uL (ref 1.5–6.5)
NEUT%: 82 % — AB (ref 39.0–75.0)
PLATELETS: 176 10*3/uL (ref 140–400)
RBC: 2.3 10*6/uL — AB (ref 4.20–5.82)
RDW: 22.9 % — ABNORMAL HIGH (ref 11.0–14.6)
WBC: 6.1 10*3/uL (ref 4.0–10.3)

## 2016-11-11 LAB — PREPARE RBC (CROSSMATCH)

## 2016-11-11 MED ORDER — PALONOSETRON HCL INJECTION 0.25 MG/5ML
0.2500 mg | Freq: Once | INTRAVENOUS | Status: AC
Start: 2016-11-11 — End: 2016-11-11
  Administered 2016-11-11: 0.25 mg via INTRAVENOUS

## 2016-11-11 MED ORDER — SODIUM CHLORIDE 0.9% FLUSH
10.0000 mL | INTRAVENOUS | Status: DC | PRN
  Administered 2016-11-11: 10 mL
  Filled 2016-11-11: qty 10

## 2016-11-11 MED ORDER — SODIUM CHLORIDE 0.9 % IV SOLN
20.0000 mg | Freq: Once | INTRAVENOUS | Status: AC
  Administered 2016-11-11: 20 mg via INTRAVENOUS
  Filled 2016-11-11: qty 2

## 2016-11-11 MED ORDER — SODIUM CHLORIDE 0.9 % IV SOLN
Freq: Once | INTRAVENOUS | Status: AC
  Administered 2016-11-11: 10:00:00 via INTRAVENOUS

## 2016-11-11 MED ORDER — SODIUM CHLORIDE 0.9 % IV SOLN: 250.0000 mL | Freq: Once | INTRAVENOUS | Status: DC

## 2016-11-11 MED ORDER — SODIUM CHLORIDE 0.9% FLUSH
10.0000 mL | INTRAVENOUS | Status: DC | PRN
  Filled 2016-11-11: qty 10

## 2016-11-11 MED ORDER — ACETAMINOPHEN 325 MG PO TABS
ORAL_TABLET | ORAL | Status: AC
  Filled 2016-11-11: qty 2

## 2016-11-11 MED ORDER — DIPHENHYDRAMINE HCL 25 MG PO CAPS: 25.0000 mg | ORAL_CAPSULE | Freq: Once | ORAL | Status: DC

## 2016-11-11 MED ORDER — FAMOTIDINE IN NACL 20-0.9 MG/50ML-% IV SOLN
20.0000 mg | Freq: Once | INTRAVENOUS | Status: AC
  Administered 2016-11-11: 20 mg via INTRAVENOUS

## 2016-11-11 MED ORDER — HEPARIN SOD (PORK) LOCK FLUSH 100 UNIT/ML IV SOLN
500.0000 [IU] | Freq: Once | INTRAVENOUS | Status: DC | PRN
  Filled 2016-11-11: qty 5

## 2016-11-11 MED ORDER — PACLITAXEL CHEMO INJECTION 300 MG/50ML
45.0000 mg/m2 | Freq: Once | INTRAVENOUS | Status: AC
  Administered 2016-11-11: 72 mg via INTRAVENOUS
  Filled 2016-11-11: qty 12

## 2016-11-11 MED ORDER — SODIUM CHLORIDE 0.9 % IV SOLN
153.4000 mg | Freq: Once | INTRAVENOUS | Status: AC
  Administered 2016-11-11: 150 mg via INTRAVENOUS
  Filled 2016-11-11: qty 15

## 2016-11-11 MED ORDER — HEPARIN SOD (PORK) LOCK FLUSH 100 UNIT/ML IV SOLN
500.0000 [IU] | Freq: Every day | INTRAVENOUS | Status: AC | PRN
  Administered 2016-11-11: 500 [IU]
  Filled 2016-11-11: qty 5

## 2016-11-11 MED ORDER — ACETAMINOPHEN 325 MG PO TABS
650.0000 mg | ORAL_TABLET | Freq: Once | ORAL | Status: AC
  Administered 2016-11-11: 650 mg via ORAL

## 2016-11-11 MED ORDER — DIPHENHYDRAMINE HCL 50 MG/ML IJ SOLN
50.0000 mg | Freq: Once | INTRAMUSCULAR | Status: AC
  Administered 2016-11-11: 50 mg via INTRAVENOUS

## 2016-11-11 MED ORDER — FAMOTIDINE IN NACL 20-0.9 MG/50ML-% IV SOLN
INTRAVENOUS | Status: AC
  Filled 2016-11-11: qty 50

## 2016-11-11 MED ORDER — PALONOSETRON HCL INJECTION 0.25 MG/5ML
INTRAVENOUS | Status: AC
  Filled 2016-11-11: qty 5

## 2016-11-11 MED ORDER — DIPHENHYDRAMINE HCL 50 MG/ML IJ SOLN
INTRAMUSCULAR | Status: AC
  Filled 2016-11-11: qty 1

## 2016-11-11 NOTE — Patient Instructions (Addendum)
[facility] Discharge Instructions for Patients Receiving Chemotherapy  Today you received the following chemotherapy agents Carboplatin/Taxol  To help prevent nausea and vomiting after your treatment, we encourage you to take your nausea medication    If you develop nausea and vomiting that is not controlled by your nausea medication, call the clinic.   BELOW ARE SYMPTOMS THAT SHOULD BE REPORTED IMMEDIATELY:  *FEVER GREATER THAN 100.5 F  *CHILLS WITH OR WITHOUT FEVER  NAUSEA AND VOMITING THAT IS NOT CONTROLLED WITH YOUR NAUSEA MEDICATION  *UNUSUAL SHORTNESS OF BREATH  *UNUSUAL BRUISING OR BLEEDING  TENDERNESS IN MOUTH AND THROAT WITH OR WITHOUT PRESENCE OF ULCERS  *URINARY PROBLEMS  *BOWEL PROBLEMS  UNUSUAL RASH Items with * indicate a potential emergency and should be followed up as soon as possible.  Feel free to call the clinic you have any questions or concerns. The clinic phone number is [phone].  Please show the [place] at check-in to the Emergency Department and triage nurse.   Blood Transfusion A blood transfusion is a procedure in which you are given blood through an IV tube. You may need this procedure because of:  Illness.  Surgery.  Injury.  The blood may come from someone else (a donor). You may also be able to donate blood for yourself (autologous blood donation). The blood given in a transfusion is made up of different types of cells. You may get:  Red blood cells. These carry oxygen to the cells in the body.  White blood cells. These help you fight infections.  Platelets. These help your blood to clot.  Plasma. This is the liquid part of your blood. It helps with fluid imbalances.  If you have a clotting disorder, you may also get other types of blood products. What happens before the procedure?  You will have a blood test to find out your blood type. The test also finds out what type of blood your body will  accept and matches it to the donor type.  If you are going to have a planned surgery, you may be able to donate your own blood. This may be done in case you need a transfusion.  If you have had an allergic reaction to a transfusion in the past, you may be given medicine to help prevent a reaction. This medicine may be given to you by mouth or through an IV.  You will have your temperature, blood pressure, and pulse checked.  Follow instructions from your doctor about what you cannot eat or drink.  Ask your doctor about: ? Changing or stopping your regular medicines. This is important if you take diabetes medicines or blood thinners. ? Taking medicines such as aspirin and ibuprofen. These medicines can thin your blood. Do not take these medicines before your procedure if your doctor tells you not to. What happens during the procedure?  An IV tube will be put into one of your veins.  The bag of donated blood will be attached to your IV tube. Then, the blood will enter through your vein.  Your temperature, blood pressure, and pulse will be checked regularly during the procedure. This is done to find early signs of a transfusion reaction.  If you have any signs or symptoms of a reaction, your transfusion will be stopped. You may also be given medicine.  When the transfusion is done, your IV tube will be taken out.  Pressure may be applied to the IV site for a few minutes.  A bandage (dressing) will be put on the IV site. The procedure may vary among doctors and hospitals. What happens after the procedure?  Your temperature, blood pressure, heart rate, breathing rate, and blood oxygen level will be checked often.  Your blood may be tested to see how you are responding to the transfusion.  You may be warmed with fluids or blankets. This is done to keep the temperature of your body normal. Summary  A blood transfusion is a procedure in which you are given blood through an IV  tube.  The blood may come from someone else (a donor). You may also be able to donate blood for yourself.  If you have had an allergic reaction to a transfusion in the past, you may be given medicine to help prevent a reaction. This medicine may be given to you by mouth or through an IV tube.  Your temperature, blood pressure, heart rate, breathing rate, and blood oxygen level will be checked often.  Your blood may be tested to see how you are responding to the transfusion. This information is not intended to replace advice given to you by your health care provider. Make sure you discuss any questions you have with your health care provider. Document Released: 07/22/2008 Document Revised: 12/18/2015 Document Reviewed: 12/18/2015 Elsevier Interactive Patient Education  2017 [employer].

## 2016-11-12 LAB — BPAM RBC
Blood Product Expiration Date: 201807282359
ISSUE DATE / TIME: 201807061312
Unit Type and Rh: 5100

## 2016-11-12 LAB — TYPE AND SCREEN
ABO/RH(D): O POS
Antibody Screen: NEGATIVE
Donor AG Type: NEGATIVE
Unit division: 0

## 2016-11-14 ENCOUNTER — Ambulatory Visit (HOSPITAL_BASED_OUTPATIENT_CLINIC_OR_DEPARTMENT_OTHER): Payer: Medicare Other

## 2016-11-14 ENCOUNTER — Other Ambulatory Visit (HOSPITAL_BASED_OUTPATIENT_CLINIC_OR_DEPARTMENT_OTHER): Payer: Medicare Other

## 2016-11-14 DIAGNOSIS — Z5189 Encounter for other specified aftercare: Secondary | ICD-10-CM

## 2016-11-14 DIAGNOSIS — D4620 Refractory anemia with excess of blasts, unspecified: Secondary | ICD-10-CM

## 2016-11-14 DIAGNOSIS — C7802 Secondary malignant neoplasm of left lung: Secondary | ICD-10-CM

## 2016-11-14 DIAGNOSIS — C3412 Malignant neoplasm of upper lobe, left bronchus or lung: Secondary | ICD-10-CM

## 2016-11-14 DIAGNOSIS — C801 Malignant (primary) neoplasm, unspecified: Secondary | ICD-10-CM

## 2016-11-14 LAB — COMPREHENSIVE METABOLIC PANEL
ALBUMIN: 3.1 g/dL — AB (ref 3.5–5.0)
ALK PHOS: 97 U/L (ref 40–150)
ALT: 7 U/L (ref 0–55)
AST: 10 U/L (ref 5–34)
Anion Gap: 8 mEq/L (ref 3–11)
BILIRUBIN TOTAL: 0.73 mg/dL (ref 0.20–1.20)
BUN: 17.2 mg/dL (ref 7.0–26.0)
CO2: 22 mEq/L (ref 22–29)
CREATININE: 0.8 mg/dL (ref 0.7–1.3)
Calcium: 8.9 mg/dL (ref 8.4–10.4)
Chloride: 109 mEq/L (ref 98–109)
GLUCOSE: 91 mg/dL (ref 70–140)
Potassium: 5.1 mEq/L (ref 3.5–5.1)
SODIUM: 139 meq/L (ref 136–145)
TOTAL PROTEIN: 7.7 g/dL (ref 6.4–8.3)

## 2016-11-14 LAB — CBC WITH DIFFERENTIAL/PLATELET
BASO%: 0.6 % (ref 0.0–2.0)
Basophils Absolute: 0 10*3/uL (ref 0.0–0.1)
EOS ABS: 0 10*3/uL (ref 0.0–0.5)
EOS%: 0 % (ref 0.0–7.0)
HCT: 28.8 % — ABNORMAL LOW (ref 38.4–49.9)
HEMOGLOBIN: 9.5 g/dL — AB (ref 13.0–17.1)
LYMPH%: 4.8 % — ABNORMAL LOW (ref 14.0–49.0)
MCH: 33.7 pg — ABNORMAL HIGH (ref 27.2–33.4)
MCHC: 33 g/dL (ref 32.0–36.0)
MCV: 102 fL — AB (ref 79.3–98.0)
MONO#: 0.4 10*3/uL (ref 0.1–0.9)
MONO%: 5.5 % (ref 0.0–14.0)
NEUT%: 89.1 % — ABNORMAL HIGH (ref 39.0–75.0)
NEUTROS ABS: 6.1 10*3/uL (ref 1.5–6.5)
Platelets: 177 10*3/uL (ref 140–400)
RBC: 2.83 10*6/uL — ABNORMAL LOW (ref 4.20–5.82)
RDW: 24.8 % — AB (ref 11.0–14.6)
WBC: 6.8 10*3/uL (ref 4.0–10.3)
lymph#: 0.3 10*3/uL — ABNORMAL LOW (ref 0.9–3.3)

## 2016-11-14 MED ORDER — PEGFILGRASTIM INJECTION 6 MG/0.6ML ~~LOC~~
6.0000 mg | PREFILLED_SYRINGE | Freq: Once | SUBCUTANEOUS | Status: AC
  Administered 2016-11-14: 6 mg via SUBCUTANEOUS
  Filled 2016-11-14: qty 0.6

## 2016-11-14 NOTE — Patient Instructions (Signed)
Pegfilgrastim injection What is this medicine? PEGFILGRASTIM (PEG fil gra stim) is a long-acting granulocyte colony-stimulating factor that stimulates the growth of neutrophils, a type of white blood cell important in the body's fight against infection. It is used to reduce the incidence of fever and infection in patients with certain types of cancer who are receiving chemotherapy that affects the bone marrow, and to increase survival after being exposed to high doses of radiation. This medicine may be used for other purposes; ask your health care provider or pharmacist if you have questions. COMMON BRAND NAME(S): Neulasta What should I tell my health care provider before I take this medicine? They need to know if you have any of these conditions: -kidney disease -latex allergy -ongoing radiation therapy -sickle cell disease -skin reactions to acrylic adhesives (On-Body Injector only) -an unusual or allergic reaction to pegfilgrastim, filgrastim, other medicines, foods, dyes, or preservatives -pregnant or trying to get pregnant -breast-feeding How should I use this medicine? This medicine is for injection under the skin. If you get this medicine at home, you will be taught how to prepare and give the pre-filled syringe or how to use the On-body Injector. Refer to the patient Instructions for Use for detailed instructions. Use exactly as directed. Tell your healthcare provider immediately if you suspect that the On-body Injector may not have performed as intended or if you suspect the use of the On-body Injector resulted in a missed or partial dose. It is important that you put your used needles and syringes in a special sharps container. Do not put them in a trash can. If you do not have a sharps container, call your pharmacist or healthcare provider to get one. Talk to your pediatrician regarding the use of this medicine in children. While this drug may be prescribed for selected conditions,  precautions do apply. Overdosage: If you think you have taken too much of this medicine contact a poison control center or emergency room at once. NOTE: This medicine is only for you. Do not share this medicine with others. What if I miss a dose? It is important not to miss your dose. Call your doctor or health care professional if you miss your dose. If you miss a dose due to an On-body Injector failure or leakage, a new dose should be administered as soon as possible using a single prefilled syringe for manual use. What may interact with this medicine? Interactions have not been studied. Give your health care provider a list of all the medicines, herbs, non-prescription drugs, or dietary supplements you use. Also tell them if you smoke, drink alcohol, or use illegal drugs. Some items may interact with your medicine. This list may not describe all possible interactions. Give your health care provider a list of all the medicines, herbs, non-prescription drugs, or dietary supplements you use. Also tell them if you smoke, drink alcohol, or use illegal drugs. Some items may interact with your medicine. What should I watch for while using this medicine? You may need blood work done while you are taking this medicine. If you are going to need a MRI, CT scan, or other procedure, tell your doctor that you are using this medicine (On-Body Injector only). What side effects may I notice from receiving this medicine? Side effects that you should report to your doctor or health care professional as soon as possible: -allergic reactions like skin rash, itching or hives, swelling of the face, lips, or tongue -dizziness -fever -pain, redness, or irritation at site   where injected -pinpoint red spots on the skin -red or dark-brown urine -shortness of breath or breathing problems -stomach or side pain, or pain at the shoulder -swelling -tiredness -trouble passing urine or change in the amount of urine Side  effects that usually do not require medical attention (report to your doctor or health care professional if they continue or are bothersome): -bone pain -muscle pain This list may not describe all possible side effects. Call your doctor for medical advice about side effects. You may report side effects to FDA at 1-800-FDA-1088. Where should I keep my medicine? Keep out of the reach of children. Store pre-filled syringes in a refrigerator between 2 and 8 degrees C (36 and 46 degrees F). Do not freeze. Keep in carton to protect from light. Throw away this medicine if it is left out of the refrigerator for more than 48 hours. Throw away any unused medicine after the expiration date. NOTE: This sheet is a summary. It may not cover all possible information. If you have questions about this medicine, talk to your doctor, pharmacist, or health care provider.  2018 Elsevier/Gold Standard (2016-04-21 12:58:03)  

## 2016-11-16 ENCOUNTER — Encounter (HOSPITAL_COMMUNITY)
Admission: RE | Admit: 2016-11-16 | Discharge: 2016-11-16 | Disposition: A | Discharge status: Home / Self Care | Payer: Medicare Other | Source: Ambulatory Visit | Attending: Hematology and Oncology | Admitting: Hematology and Oncology

## 2016-11-16 ENCOUNTER — Encounter (HOSPITAL_COMMUNITY): Payer: Self-pay

## 2016-11-16 DIAGNOSIS — C3412 Malignant neoplasm of upper lobe, left bronchus or lung: Secondary | ICD-10-CM | POA: Insufficient documentation

## 2016-11-21 ENCOUNTER — Other Ambulatory Visit: Payer: Self-pay | Admitting: Hematology and Oncology

## 2016-11-21 DIAGNOSIS — C3412 Malignant neoplasm of upper lobe, left bronchus or lung: Secondary | ICD-10-CM

## 2016-11-23 ENCOUNTER — Ambulatory Visit (HOSPITAL_BASED_OUTPATIENT_CLINIC_OR_DEPARTMENT_OTHER): Payer: Medicare Other

## 2016-11-23 ENCOUNTER — Encounter: Payer: Self-pay | Admitting: Hematology and Oncology

## 2016-11-23 ENCOUNTER — Ambulatory Visit: Payer: Medicare Other

## 2016-11-23 ENCOUNTER — Other Ambulatory Visit (HOSPITAL_BASED_OUTPATIENT_CLINIC_OR_DEPARTMENT_OTHER): Payer: Medicare Other

## 2016-11-23 ENCOUNTER — Ambulatory Visit (HOSPITAL_BASED_OUTPATIENT_CLINIC_OR_DEPARTMENT_OTHER): Payer: Medicare Other | Admitting: Hematology and Oncology

## 2016-11-23 ENCOUNTER — Telehealth: Payer: Self-pay | Admitting: Hematology and Oncology

## 2016-11-23 DIAGNOSIS — C801 Malignant (primary) neoplasm, unspecified: Secondary | ICD-10-CM

## 2016-11-23 DIAGNOSIS — C3412 Malignant neoplasm of upper lobe, left bronchus or lung: Secondary | ICD-10-CM

## 2016-11-23 DIAGNOSIS — D4620 Refractory anemia with excess of blasts, unspecified: Secondary | ICD-10-CM

## 2016-11-23 DIAGNOSIS — R64 Cachexia: Secondary | ICD-10-CM | POA: Diagnosis not present

## 2016-11-23 DIAGNOSIS — Z5111 Encounter for antineoplastic chemotherapy: Secondary | ICD-10-CM | POA: Diagnosis not present

## 2016-11-23 DIAGNOSIS — C7802 Secondary malignant neoplasm of left lung: Secondary | ICD-10-CM

## 2016-11-23 DIAGNOSIS — D61818 Other pancytopenia: Secondary | ICD-10-CM

## 2016-11-23 LAB — COMPREHENSIVE METABOLIC PANEL
ALBUMIN: 3.1 g/dL — AB (ref 3.5–5.0)
ALK PHOS: 120 U/L (ref 40–150)
ALT: <6 U/L (ref 0–55)
ANION GAP: 11 meq/L (ref 3–11)
AST: 9 U/L (ref 5–34)
BILIRUBIN TOTAL: 0.34 mg/dL (ref 0.20–1.20)
BUN: 17.4 mg/dL (ref 7.0–26.0)
CALCIUM: 8.8 mg/dL (ref 8.4–10.4)
CO2: 21 mEq/L — ABNORMAL LOW (ref 22–29)
Chloride: 110 mEq/L — ABNORMAL HIGH (ref 98–109)
Creatinine: 0.9 mg/dL (ref 0.7–1.3)
Glucose: 83 mg/dl (ref 70–140)
POTASSIUM: 4.1 meq/L (ref 3.5–5.1)
Sodium: 141 mEq/L (ref 136–145)
TOTAL PROTEIN: 7.9 g/dL (ref 6.4–8.3)

## 2016-11-23 LAB — CBC WITH DIFFERENTIAL/PLATELET
BASO%: 0.1 % (ref 0.0–2.0)
BASOS ABS: 0 10*3/uL (ref 0.0–0.1)
EOS ABS: 0 10*3/uL (ref 0.0–0.5)
EOS%: 0.1 % (ref 0.0–7.0)
HCT: 29.2 % — ABNORMAL LOW (ref 38.4–49.9)
HEMOGLOBIN: 9.2 g/dL — AB (ref 13.0–17.1)
LYMPH%: 3.6 % — ABNORMAL LOW (ref 14.0–49.0)
MCH: 33.2 pg (ref 27.2–33.4)
MCHC: 31.5 g/dL — ABNORMAL LOW (ref 32.0–36.0)
MCV: 105.4 fL — AB (ref 79.3–98.0)
MONO#: 1.5 10*3/uL — AB (ref 0.1–0.9)
MONO%: 6.6 % (ref 0.0–14.0)
NEUT%: 89.6 % — ABNORMAL HIGH (ref 39.0–75.0)
NEUTROS ABS: 19.7 10*3/uL — AB (ref 1.5–6.5)
PLATELETS: 240 10*3/uL (ref 140–400)
RBC: 2.77 10*6/uL — ABNORMAL LOW (ref 4.20–5.82)
RDW: 25 % — AB (ref 11.0–14.6)
WBC: 22 10*3/uL — ABNORMAL HIGH (ref 4.0–10.3)
lymph#: 0.8 10*3/uL — ABNORMAL LOW (ref 0.9–3.3)

## 2016-11-23 MED ORDER — DIPHENHYDRAMINE HCL 50 MG/ML IJ SOLN
INTRAMUSCULAR | Status: AC
  Filled 2016-11-23: qty 1

## 2016-11-23 MED ORDER — SODIUM CHLORIDE 0.9 % IV SOLN
Freq: Once | INTRAVENOUS | Status: AC
  Administered 2016-11-23: 10:00:00 via INTRAVENOUS

## 2016-11-23 MED ORDER — PALONOSETRON HCL INJECTION 0.25 MG/5ML
INTRAVENOUS | Status: AC
  Filled 2016-11-23: qty 5

## 2016-11-23 MED ORDER — FAMOTIDINE IN NACL 20-0.9 MG/50ML-% IV SOLN
20.0000 mg | Freq: Once | INTRAVENOUS | Status: AC
  Administered 2016-11-23: 20 mg via INTRAVENOUS

## 2016-11-23 MED ORDER — PACLITAXEL CHEMO INJECTION 300 MG/50ML
45.0000 mg/m2 | Freq: Once | INTRAVENOUS | Status: AC
  Administered 2016-11-23: 72 mg via INTRAVENOUS
  Filled 2016-11-23: qty 12

## 2016-11-23 MED ORDER — SODIUM CHLORIDE 0.9 % IV SOLN
20.0000 mg | Freq: Once | INTRAVENOUS | Status: AC
  Administered 2016-11-23: 20 mg via INTRAVENOUS
  Filled 2016-11-23: qty 2

## 2016-11-23 MED ORDER — DIPHENHYDRAMINE HCL 50 MG/ML IJ SOLN
50.0000 mg | Freq: Once | INTRAMUSCULAR | Status: AC
  Administered 2016-11-23: 50 mg via INTRAVENOUS

## 2016-11-23 MED ORDER — PALONOSETRON HCL INJECTION 0.25 MG/5ML
0.2500 mg | Freq: Once | INTRAVENOUS | Status: AC
  Administered 2016-11-23: 0.25 mg via INTRAVENOUS

## 2016-11-23 MED ORDER — DARBEPOETIN ALFA 500 MCG/ML IJ SOSY
500.0000 ug | PREFILLED_SYRINGE | Freq: Once | INTRAMUSCULAR | Status: AC
  Administered 2016-11-23: 500 ug via SUBCUTANEOUS
  Filled 2016-11-23: qty 1

## 2016-11-23 MED ORDER — SODIUM CHLORIDE 0.9% FLUSH
10.0000 mL | Freq: Once | INTRAVENOUS | Status: AC
  Administered 2016-11-23: 10 mL
  Filled 2016-11-23: qty 10

## 2016-11-23 MED ORDER — SODIUM CHLORIDE 0.9% FLUSH
10.0000 mL | INTRAVENOUS | Status: DC | PRN
  Administered 2016-11-23: 10 mL
  Filled 2016-11-23: qty 10

## 2016-11-23 MED ORDER — SODIUM CHLORIDE 0.9 % IV SOLN
153.4000 mg | Freq: Once | INTRAVENOUS | Status: AC
  Administered 2016-11-23: 150 mg via INTRAVENOUS
  Filled 2016-11-23: qty 15

## 2016-11-23 MED ORDER — HEPARIN SOD (PORK) LOCK FLUSH 100 UNIT/ML IV SOLN
500.0000 [IU] | Freq: Once | INTRAVENOUS | Status: AC | PRN
  Administered 2016-11-23: 500 [IU]
  Filled 2016-11-23: qty 5

## 2016-11-23 MED ORDER — FAMOTIDINE IN NACL 20-0.9 MG/50ML-% IV SOLN
INTRAVENOUS | Status: AC
  Filled 2016-11-23: qty 50

## 2016-11-23 NOTE — Assessment & Plan Note (Signed)
He tolerated treatment very well with no side effects from treatment He will continue treatment without dose adjustment He will continue to get coverage for G-CSF, blood transfusion and Aranesp for MDS while on treatment PET CT scan is scheduled for next week and I plan to see him back to discuss response to treatment

## 2016-11-23 NOTE — Progress Notes (Signed)
[facility] OFFICE PROGRESS NOTE  Patient Care Team: [name], MD as PCP - General (Family Medicine) [name], MD as Consulting Physician (Hematology and Oncology)  SUMMARY OF ONCOLOGIC HISTORY: Oncology History   MDS, R-IPSS score of 3, low risk (Hg 8.9, +16 chromosome on BM, 2% blast count)   Squamous cell carcinoma of the epiglottis   Primary site: Larynx - Supraglottis (Left)   Staging method: AJCC 7th Edition   Clinical: Stage I (T1, N0, M0) signed by [name], MD on 04/30/2013 12:49 PM   Pathologic: Stage I (T1, N0, cM0) signed by [name], MD on 04/30/2013 12:49 PM   Summary: Stage I (T1, N0, cM0)       MDS (myelodysplastic syndrome), low grade ([place])   02/01/2007 Bone Marrow Biopsy    BM biopsy was abnormal, overall probable low grade MDS      03/23/2011 - 02/06/2013 Chemotherapy    [name], discontinued due to diagnosis of laryngeal ca      09/06/2013 Bone Marrow Biopsy    Repeat bone marrow aspirate and biopsy confirmed low-grade myelodysplastic syndrome.      09/17/2013 -  Chemotherapy    The patient resumed darbepoetin injections to treat the anemia.       History of head and neck cancer   03/06/2013 Procedure    Biopsy from epiglottic region showed invasive [facility]      04/25/2013 Imaging    Ct scan showed no other involvement in the LN      05/13/2013 - 06/28/2013 Radiation Therapy    He received radiation therapy, 70 gray in 35 fractions to the larynx      06/09/2016 Imaging    CT scan of the chest showed Bilateral spiculated soft tissue pulmonary masses, highly suspicious for multifocal malignancy. Borderline enlarged left-sided mediastinal lymphadenopathy. Background of severe emphysematous changes, chronic cylindrical bronchiectasis and hyperinflation of the lungs. Findings consistent with longstanding COPD. Diffusely thickened interstitial markings. This may represent chronic interstitial lung changes, however  lymphangitic spread of malignancy cannot be excluded. Anterior compression deformities of several of the thoracic vertebral bodies, with heterogeneous appearance of T7 and T9 vertebral body. This may represent degenerative changes, however osseous metastatic disease cannot be excluded.       07/11/2016 PET scan    There are 2 nodules in the left upper lobe and 2 nodules within the right lower lobe which have spiculated margin and exhibit intense radiotracer uptake compatible with multifocal pulmonary metastasis versus multiple synchronous primary pulmonary neoplasms. 2. Emphysema 3. Aortic atherosclerosis and multi vessel coronary artery calcification.       Cancer of upper lobe of left lung ([place])   06/09/2016 Imaging    CT chest: Bilateral spiculated soft tissue pulmonary masses, highly suspicious for multifocal malignancy. Borderline enlarged left-sided mediastinal lymphadenopathy. Background of severe emphysematous changes, chronic cylindrical bronchiectasis and hyperinflation of the lungs. Findings consistent with longstanding COPD. Diffusely thickened interstitial markings. This may represent chronic interstitial lung changes, however lymphangitic spread of malignancy cannot be excluded. Anterior compression deformities of several of the thoracic vertebral bodies, with heterogeneous appearance of T7 and T9 vertebral body. This may represent degenerative changes, however osseous metastatic disease cannot be excluded.       06/29/2016 Imaging    MRI brain: No evidence of metastatic disease or recent infarction. Advanced generalized brain atrophy with moderate to marked chronic small-vessel ischemic changes throughout. FLAIR imaging appears to show scattered gyri showing FLAIR hyperintensity. These abnormalities are not  confirmed with restricted diffusion or contrast enhancement. Therefore, we are not certain that this is not artifactual. The differential diagnosis does include true pathology such  as posterior reversible encephalopathy, viral or prion disease, postictal change in post chemotherapy change. If there is concern about active CNS pathology, re- scanning in 4-6 weeks could be useful.      07/27/2016 Imaging    CT chest: Bilateral pulmonary lesions. These are suspicious for metastatic disease. Lesions have minimally changed but there may be slight enlargement of the cavitary lesion in the right lower lobe. Severe emphysematous changes.       08/10/2016 Pathology Results    1. Lung, biopsy, RUL, (apical segment) - SCANT BENIGN LUNG PARENCHYMA. - THERE IS NO EVIDENCE OF MALIGNANCY. 2. Lung, biopsy, LUL lingula - SQUAMOUS CELL CARCINOMA. - SEE COMMENT. 3. Lung, biopsy, LUL - ADENOCARCINOMA. - SEE COMMENT. Microscopic Comment 2. The malignant cells are positive for cytokeratin 56 and p63. They are negative for TTF-1. The findings are consistent with squamous cell carcinoma. There is likely insufficient tissue remaining for additional studies, if requested. 3. The malignant cells are positive for TTF-1 and negative for p63 and cytokeratin 5/6. The findings are consistent with adenocarcinoma.      08/10/2016 Procedure    The targets were defined as follows: Left upper lobe nodule, target 1 Lingular nodule, target 2 Proximal right lower lobe nodule, target 3 More distal right lower lobe, nodule  The extendable working channel was secured into place and the locator guide was withdrawn. Under fluoroscopic guidance transbronchial needle brushings, transbronchial Wang needle biopsies, and transbronchial forceps biopsies were performed in the LUL (target 1) and the lingula (target 2) to be sent for cytology and pathology. A bronchioalveolar lavage was performed in the lingula in the vicinity of target 2  and sent for cytology and microbiology (bacterial, fungal, AFB smears and cultures).  Fiducial markers were then placed around target 1, target 2, and in the right lower lobe in the  vicinity of both target 3 and target 4 to be used for possible radiation therapy should this be indicated. At the end of the procedure a general airway inspection was performed and there was no evidence of active bleeding. The bronchoscope was removed.  The patient tolerated the procedure well. There was no significant blood loss and there were no obvious complications. A post-procedural chest x-ray is pending.  Samples: 1. Transbronchial needle brushings from targets 1 and 2 2. Transbronchial Wang needle biopsies from targets 1 and 2 3. Transbronchial forceps biopsies from targets 1 and 2 4. Bronchoalveolar lavage from lingula, target 2 5. Endobronchial biopsies from RUL apical segment 6. Endobronchial brushings from right upper lobe apical segment       08/10/2016 Genetic Testing    Patient has genetic testing done for [employer] and PD-L1 testing Results revealed PD-L1 testing is negative 0%      08/31/2016 Procedure    Technically successful right IJ power-injectable port catheter placement. Ready for routine use       INTERVAL HISTORY: Please see below for problem oriented charting. He returns for further follow-up He is doing well He has no further weight loss He denies recent smoking No recent infection Denies peripheral neuropathy from treatment Overall, he had no side effects of treatment except for abnormal CBC.  REVIEW OF SYSTEMS:   Constitutional: Denies fevers, chills or abnormal weight loss Eyes: Denies blurriness of vision Ears, nose, mouth, throat, and face: Denies mucositis or sore throat Respiratory:  Denies cough, dyspnea or wheezes Cardiovascular: Denies palpitation, chest discomfort or lower extremity swelling Gastrointestinal:  Denies nausea, heartburn or change in bowel habits Skin: Denies abnormal skin rashes Lymphatics: Denies new lymphadenopathy or easy bruising Neurological:Denies numbness, tingling or new weaknesses Behavioral/Psych: Mood is  stable, no new changes  All other systems were reviewed with the patient and are negative.  I have reviewed the past medical history, past surgical history, social history and family history with the patient and they are unchanged from previous note.  ALLERGIES:  is allergic to no known allergies.  MEDICATIONS:  Current Outpatient Prescriptions  Medication Sig Dispense Refill  . albuterol (PROVENTIL HFA;VENTOLIN HFA) 108 (90 Base) MCG/ACT inhaler Inhale 2 puffs into the lungs every 4 (four) hours as needed for wheezing or shortness of breath. 1 Inhaler 5  . cyanocobalamin 500 MCG tablet Take 500 mcg by mouth daily.    . ferrous sulfate 325 (65 FE) MG tablet Take 325 mg by mouth daily with breakfast.    . folic acid (FOLVITE) 1 MG tablet Take 1 mg by mouth daily.    . Glycopyrrolate-Formoterol (BEVESPI AEROSPHERE) 9-4.8 MCG/ACT AERO Inhale 2 puffs into the lungs 2 (two) times daily. 2 Inhaler 0  . levETIRAcetam (KEPPRA) 500 MG tablet Take 1 tablet (500 mg total) by mouth 2 (two) times daily. 60 tablet 11  . lidocaine-prilocaine (EMLA) cream Apply to affected area once 30 g 3  . ondansetron (ZOFRAN) 8 MG tablet Take 1 tablet (8 mg total) by mouth 2 (two) times daily as needed for refractory nausea / vomiting. Start on day 3 after chemo. 30 tablet 1  . vitamin E 1000 UNIT capsule Take 1,000 Units by mouth daily.      No current facility-administered medications for this visit.     PHYSICAL EXAMINATION: ECOG PERFORMANCE STATUS: 1 - Symptomatic but completely ambulatory  Vitals:   11/23/16 1100  BP: 121/66  Pulse: 81  Resp: 17  Temp: 97.9 F (36.6 C)   Filed Weights   11/23/16 1100  Weight: 110 lb 11.2 oz (50.2 kg)    GENERAL:alert, no distress and comfortable SKIN: skin color, texture, turgor are normal, no rashes or significant lesions EYES: normal, Conjunctiva are pink and non-injected, sclera clear OROPHARYNX:no exudate, no erythema and lips, buccal mucosa, and tongue normal   NECK: supple, thyroid normal size, non-tender, without nodularity LYMPH:  no palpable lymphadenopathy in the cervical, axillary or inguinal LUNGS: clear to auscultation and percussion with normal breathing effort HEART: regular rate & rhythm and no murmurs and no lower extremity edema ABDOMEN:abdomen soft, non-tender and normal bowel sounds Musculoskeletal:no cyanosis of digits and no clubbing  NEURO: alert & oriented x 3 with fluent speech, no focal motor/sensory deficits  LABORATORY DATA:  I have reviewed the data as listed    Component Value Date/Time   NA 141 11/23/2016 1024   K 4.1 11/23/2016 1024   CL 108 08/31/2016 1155   CL 107 09/19/2012 1325   CO2 21 (L) 11/23/2016 1024   GLUCOSE 83 11/23/2016 1024   GLUCOSE 97 09/19/2012 1325   BUN 17.4 11/23/2016 1024   CREATININE 0.9 11/23/2016 1024   CALCIUM 8.8 11/23/2016 1024   PROT 7.9 11/23/2016 1024   ALBUMIN 3.1 (L) 11/23/2016 1024   AST 9 11/23/2016 1024   ALT <6 11/23/2016 1024   ALKPHOS 120 11/23/2016 1024   BILITOT 0.34 11/23/2016 1024   GFRNONAA >60 08/31/2016 1155   GFRAA >60 08/31/2016 1155    No  results found for: SPEP, UPEP  Lab Results  Component Value Date   WBC 22.0 (H) 11/23/2016   NEUTROABS 19.7 (H) 11/23/2016   HGB 9.2 (L) 11/23/2016   HCT 29.2 (L) 11/23/2016   MCV 105.4 (H) 11/23/2016   PLT 240 11/23/2016      Chemistry      Component Value Date/Time   NA 141 11/23/2016 1024   K 4.1 11/23/2016 1024   CL 108 08/31/2016 1155   CL 107 09/19/2012 1325   CO2 21 (L) 11/23/2016 1024   BUN 17.4 11/23/2016 1024   CREATININE 0.9 11/23/2016 1024      Component Value Date/Time   CALCIUM 8.8 11/23/2016 1024   ALKPHOS 120 11/23/2016 1024   AST 9 11/23/2016 1024   ALT <6 11/23/2016 1024   BILITOT 0.34 11/23/2016 1024    ASSESSMENT & PLAN:  Cancer of upper lobe of left lung ([place]) He tolerated treatment very well with no side effects from treatment He will continue treatment without dose  adjustment He will continue to get coverage for G-CSF, blood transfusion and Aranesp for MDS while on treatment PET CT scan is scheduled for next week and I plan to see him back to discuss response to treatment  MDS (myelodysplastic syndrome), low grade The anemia from MDS is stable. I suspect he may have some degree of bone marrow suppression from his antiseizure medications. He had responded well to Darbopeitin in the past with improvement in energy level.  The patient will get regular blood work done. The patient will continue on Aranep injection to keep hemoglobin greater than 10 g. He will continue to get darbepoetin injection while on treatment, scheduled to be given every 2 weeks. He had recently received blood transfusion with symptomatic improvement.  He will continue to receive blood if hemoglobin is less than 8  Malignant cachexia (HCC) His appetite is improved. He is doing well on low-dose prednisone therapy He has not lost any weight Continue the same   No orders of the defined types were placed in this encounter.  All questions were answered. The patient knows to call the clinic with any problems, questions or concerns. No barriers to learning was detected. I spent 15 minutes counseling the patient face to face. The total time spent in the appointment was 20 minutes and more than 50% was on counseling and review of test results     [name], MD 11/23/2016 6:19 PM

## 2016-11-23 NOTE — Telephone Encounter (Signed)
Scheduled appt per 7/18 los - Gave patient AVS and calender per los.

## 2016-11-23 NOTE — Assessment & Plan Note (Signed)
His appetite is improved. He is doing well on low-dose prednisone therapy He has not lost any weight Continue the same

## 2016-11-23 NOTE — Patient Instructions (Signed)
[place] Discharge Instructions for Patients Receiving Chemotherapy  Today you received the following chemotherapy agents Carboplatin/Taxol  To help prevent nausea and vomiting after your treatment, we encourage you to take your nausea medication    If you develop nausea and vomiting that is not controlled by your nausea medication, call the clinic.   BELOW ARE SYMPTOMS THAT SHOULD BE REPORTED IMMEDIATELY:  *FEVER GREATER THAN 100.5 F  *CHILLS WITH OR WITHOUT FEVER  NAUSEA AND VOMITING THAT IS NOT CONTROLLED WITH YOUR NAUSEA MEDICATION  *UNUSUAL SHORTNESS OF BREATH  *UNUSUAL BRUISING OR BLEEDING  TENDERNESS IN MOUTH AND THROAT WITH OR WITHOUT PRESENCE OF ULCERS  *URINARY PROBLEMS  *BOWEL PROBLEMS  UNUSUAL RASH Items with * indicate a potential emergency and should be followed up as soon as possible.  Feel free to call the clinic you have any questions or concerns. The clinic phone number is [phone].  Please show the [place] at check-in to the Emergency Department and triage nurse.   Blood Transfusion A blood transfusion is a procedure in which you are given blood through an IV tube. You may need this procedure because of:  Illness.  Surgery.  Injury.  The blood may come from someone else (a donor). You may also be able to donate blood for yourself (autologous blood donation). The blood given in a transfusion is made up of different types of cells. You may get:  Red blood cells. These carry oxygen to the cells in the body.  White blood cells. These help you fight infections.  Platelets. These help your blood to clot.  Plasma. This is the liquid part of your blood. It helps with fluid imbalances.  If you have a clotting disorder, you may also get other types of blood products. What happens before the procedure?  You will have a blood test to find out your blood type. The test also finds out what type of blood your body will  accept and matches it to the donor type.  If you are going to have a planned surgery, you may be able to donate your own blood. This may be done in case you need a transfusion.  If you have had an allergic reaction to a transfusion in the past, you may be given medicine to help prevent a reaction. This medicine may be given to you by mouth or through an IV.  You will have your temperature, blood pressure, and pulse checked.  Follow instructions from your doctor about what you cannot eat or drink.  Ask your doctor about: ? Changing or stopping your regular medicines. This is important if you take diabetes medicines or blood thinners. ? Taking medicines such as aspirin and ibuprofen. These medicines can thin your blood. Do not take these medicines before your procedure if your doctor tells you not to. What happens during the procedure?  An IV tube will be put into one of your veins.  The bag of donated blood will be attached to your IV tube. Then, the blood will enter through your vein.  Your temperature, blood pressure, and pulse will be checked regularly during the procedure. This is done to find early signs of a transfusion reaction.  If you have any signs or symptoms of a reaction, your transfusion will be stopped. You may also be given medicine.  When the transfusion is done, your IV tube will be taken out.  Pressure may be applied to the IV site for a few minutes.  A bandage (dressing) will be put on the IV site. The procedure may vary among doctors and hospitals. What happens after the procedure?  Your temperature, blood pressure, heart rate, breathing rate, and blood oxygen level will be checked often.  Your blood may be tested to see how you are responding to the transfusion.  You may be warmed with fluids or blankets. This is done to keep the temperature of your body normal. Summary  A blood transfusion is a procedure in which you are given blood through an IV  tube.  The blood may come from someone else (a donor). You may also be able to donate blood for yourself.  If you have had an allergic reaction to a transfusion in the past, you may be given medicine to help prevent a reaction. This medicine may be given to you by mouth or through an IV tube.  Your temperature, blood pressure, heart rate, breathing rate, and blood oxygen level will be checked often.  Your blood may be tested to see how you are responding to the transfusion. This information is not intended to replace advice given to you by your health care provider. Make sure you discuss any questions you have with your health care provider. Document Released: 07/22/2008 Document Revised: 12/18/2015 Document Reviewed: 12/18/2015 Elsevier Interactive Patient Education  2017 [employer].

## 2016-11-23 NOTE — Assessment & Plan Note (Signed)
The anemia from MDS is stable. I suspect he may have some degree of bone marrow suppression from his antiseizure medications. He had responded well to Darbopeitin in the past with improvement in energy level.  The patient will get regular blood work done. The patient will continue on Aranep injection to keep hemoglobin greater than 10 g. He will continue to get darbepoetin injection while on treatment, scheduled to be given every 2 weeks. He had recently received blood transfusion with symptomatic improvement.  He will continue to receive blood if hemoglobin is less than 8

## 2016-11-29 ENCOUNTER — Ambulatory Visit (HOSPITAL_COMMUNITY)
Admission: RE | Admit: 2016-11-29 | Discharge: 2016-11-29 | Disposition: A | Discharge status: Home / Self Care | Payer: Medicare Other | Source: Ambulatory Visit | Attending: Hematology and Oncology | Admitting: Hematology and Oncology

## 2016-11-29 DIAGNOSIS — C3412 Malignant neoplasm of upper lobe, left bronchus or lung: Secondary | ICD-10-CM | POA: Diagnosis not present

## 2016-11-29 DIAGNOSIS — C3492 Malignant neoplasm of unspecified part of left bronchus or lung: Secondary | ICD-10-CM | POA: Diagnosis not present

## 2016-11-29 LAB — GLUCOSE, CAPILLARY: Glucose-Capillary: 81 mg/dL (ref 65–99)

## 2016-11-29 MED ORDER — FLUDEOXYGLUCOSE F - 18 (FDG) INJECTION
4.6000 | Freq: Once | INTRAVENOUS | Status: AC | PRN
  Administered 2016-11-29: 4.6 via INTRAVENOUS

## 2016-11-30 ENCOUNTER — Telehealth: Payer: Self-pay | Admitting: Oncology

## 2016-11-30 ENCOUNTER — Other Ambulatory Visit (HOSPITAL_BASED_OUTPATIENT_CLINIC_OR_DEPARTMENT_OTHER): Payer: Medicare Other

## 2016-11-30 ENCOUNTER — Ambulatory Visit: Payer: Medicare Other

## 2016-11-30 ENCOUNTER — Ambulatory Visit (HOSPITAL_BASED_OUTPATIENT_CLINIC_OR_DEPARTMENT_OTHER): Payer: Medicare Other

## 2016-11-30 ENCOUNTER — Encounter: Payer: Self-pay | Admitting: Hematology and Oncology

## 2016-11-30 ENCOUNTER — Other Ambulatory Visit: Payer: Self-pay | Admitting: Hematology and Oncology

## 2016-11-30 ENCOUNTER — Ambulatory Visit (HOSPITAL_BASED_OUTPATIENT_CLINIC_OR_DEPARTMENT_OTHER): Payer: Medicare Other | Admitting: Hematology and Oncology

## 2016-11-30 VITALS — BP 111/61 | HR 81 | Temp 97.7°F | Resp 18 | Ht 72.0 in | Wt 110.6 lb

## 2016-11-30 VITALS — BP 127/58 | HR 69 | Temp 97.7°F | Resp 17

## 2016-11-30 DIAGNOSIS — D61818 Other pancytopenia: Secondary | ICD-10-CM

## 2016-11-30 DIAGNOSIS — C3412 Malignant neoplasm of upper lobe, left bronchus or lung: Secondary | ICD-10-CM

## 2016-11-30 DIAGNOSIS — D4620 Refractory anemia with excess of blasts, unspecified: Secondary | ICD-10-CM | POA: Diagnosis not present

## 2016-11-30 DIAGNOSIS — Z8589 Personal history of malignant neoplasm of other organs and systems: Secondary | ICD-10-CM

## 2016-11-30 DIAGNOSIS — Z5111 Encounter for antineoplastic chemotherapy: Secondary | ICD-10-CM

## 2016-11-30 DIAGNOSIS — C7802 Secondary malignant neoplasm of left lung: Secondary | ICD-10-CM

## 2016-11-30 DIAGNOSIS — C801 Malignant (primary) neoplasm, unspecified: Secondary | ICD-10-CM

## 2016-11-30 LAB — COMPREHENSIVE METABOLIC PANEL
ALBUMIN: 3.2 g/dL — AB (ref 3.5–5.0)
AST: 8 U/L (ref 5–34)
Alkaline Phosphatase: 96 U/L (ref 40–150)
Anion Gap: 8 mEq/L (ref 3–11)
BILIRUBIN TOTAL: 0.45 mg/dL (ref 0.20–1.20)
BUN: 24.7 mg/dL (ref 7.0–26.0)
CO2: 21 meq/L — AB (ref 22–29)
CREATININE: 0.9 mg/dL (ref 0.7–1.3)
Calcium: 9 mg/dL (ref 8.4–10.4)
Chloride: 110 mEq/L — ABNORMAL HIGH (ref 98–109)
GLUCOSE: 88 mg/dL (ref 70–140)
Potassium: 4.8 mEq/L (ref 3.5–5.1)
SODIUM: 138 meq/L (ref 136–145)
TOTAL PROTEIN: 7.8 g/dL (ref 6.4–8.3)

## 2016-11-30 LAB — CBC WITH DIFFERENTIAL/PLATELET
BASO%: 0.7 % (ref 0.0–2.0)
Basophils Absolute: 0.1 10*3/uL (ref 0.0–0.1)
EOS%: 0.2 % (ref 0.0–7.0)
Eosinophils Absolute: 0 10*3/uL (ref 0.0–0.5)
HCT: 25.8 % — ABNORMAL LOW (ref 38.4–49.9)
HEMOGLOBIN: 8.1 g/dL — AB (ref 13.0–17.1)
LYMPH%: 6.6 % — AB (ref 14.0–49.0)
MCH: 32.5 pg (ref 27.2–33.4)
MCHC: 31.4 g/dL — ABNORMAL LOW (ref 32.0–36.0)
MCV: 103.6 fL — ABNORMAL HIGH (ref 79.3–98.0)
MONO#: 0.4 10*3/uL (ref 0.1–0.9)
MONO%: 5.3 % (ref 0.0–14.0)
NEUT%: 87.2 % — ABNORMAL HIGH (ref 39.0–75.0)
NEUTROS ABS: 7.1 10*3/uL — AB (ref 1.5–6.5)
Platelets: 165 10*3/uL (ref 140–400)
RBC: 2.49 10*6/uL — AB (ref 4.20–5.82)
RDW: 24.8 % — AB (ref 11.0–14.6)
WBC: 8.2 10*3/uL (ref 4.0–10.3)
lymph#: 0.5 10*3/uL — ABNORMAL LOW (ref 0.9–3.3)

## 2016-11-30 LAB — PREPARE RBC (CROSSMATCH)

## 2016-11-30 MED ORDER — ACETAMINOPHEN 325 MG PO TABS
650.0000 mg | ORAL_TABLET | Freq: Once | ORAL | Status: AC
  Administered 2016-11-30: 650 mg via ORAL

## 2016-11-30 MED ORDER — DIPHENHYDRAMINE HCL 50 MG/ML IJ SOLN
50.0000 mg | Freq: Once | INTRAMUSCULAR | Status: AC
  Administered 2016-11-30: 50 mg via INTRAVENOUS

## 2016-11-30 MED ORDER — PALONOSETRON HCL INJECTION 0.25 MG/5ML
INTRAVENOUS | Status: AC
  Filled 2016-11-30: qty 5

## 2016-11-30 MED ORDER — DIPHENHYDRAMINE HCL 50 MG/ML IJ SOLN
INTRAMUSCULAR | Status: AC
  Filled 2016-11-30: qty 1

## 2016-11-30 MED ORDER — SODIUM CHLORIDE 0.9 % IV SOLN
152.0000 mg | Freq: Once | INTRAVENOUS | Status: AC
  Administered 2016-11-30: 150 mg via INTRAVENOUS
  Filled 2016-11-30: qty 15

## 2016-11-30 MED ORDER — SODIUM CHLORIDE 0.9% FLUSH
10.0000 mL | Freq: Once | INTRAVENOUS | Status: AC
  Administered 2016-11-30: 10 mL
  Filled 2016-11-30: qty 10

## 2016-11-30 MED ORDER — HEPARIN SOD (PORK) LOCK FLUSH 100 UNIT/ML IV SOLN
500.0000 [IU] | Freq: Once | INTRAVENOUS | Status: AC | PRN
  Administered 2016-11-30: 500 [IU]
  Filled 2016-11-30: qty 5

## 2016-11-30 MED ORDER — PACLITAXEL CHEMO INJECTION 300 MG/50ML
45.0000 mg/m2 | Freq: Once | INTRAVENOUS | Status: AC
  Administered 2016-11-30: 72 mg via INTRAVENOUS
  Filled 2016-11-30: qty 12

## 2016-11-30 MED ORDER — ACETAMINOPHEN 325 MG PO TABS
ORAL_TABLET | ORAL | Status: AC
  Filled 2016-11-30: qty 2

## 2016-11-30 MED ORDER — SODIUM CHLORIDE 0.9 % IV SOLN
Freq: Once | INTRAVENOUS | Status: AC
  Administered 2016-11-30: 10:00:00 via INTRAVENOUS

## 2016-11-30 MED ORDER — PALONOSETRON HCL INJECTION 0.25 MG/5ML
0.2500 mg | Freq: Once | INTRAVENOUS | Status: AC
  Administered 2016-11-30: 0.25 mg via INTRAVENOUS

## 2016-11-30 MED ORDER — FAMOTIDINE IN NACL 20-0.9 MG/50ML-% IV SOLN
INTRAVENOUS | Status: AC
  Filled 2016-11-30: qty 50

## 2016-11-30 MED ORDER — SODIUM CHLORIDE 0.9% FLUSH
10.0000 mL | INTRAVENOUS | Status: DC | PRN
  Administered 2016-11-30: 10 mL
  Filled 2016-11-30: qty 10

## 2016-11-30 MED ORDER — SODIUM CHLORIDE 0.9 % IV SOLN
20.0000 mg | Freq: Once | INTRAVENOUS | Status: AC
  Administered 2016-11-30: 20 mg via INTRAVENOUS
  Filled 2016-11-30: qty 2

## 2016-11-30 MED ORDER — FAMOTIDINE IN NACL 20-0.9 MG/50ML-% IV SOLN
20.0000 mg | Freq: Once | INTRAVENOUS | Status: AC
  Administered 2016-11-30: 20 mg via INTRAVENOUS

## 2016-11-30 NOTE — Assessment & Plan Note (Signed)
The anemia from MDS is stable. I suspect he may have some degree of bone marrow suppression from his antiseizure medications. He had responded well to Darbopeitin in the past with improvement in energy level.  The patient will get regular blood work done. The patient will continue on Aranep injection to keep hemoglobin greater than 10 g. He will continue to get darbepoetin injection while on treatment, scheduled to be given every 2 weeks. He had recently received blood transfusion with symptomatic improvement.  He will continue to receive blood if hemoglobin is less than 8 Today, he is mildly symptomatic. We discussed some of the risks, benefits, and alternatives of blood transfusions. The patient is symptomatic from anemia and the hemoglobin level is critically low.  Some of the side-effects to be expected including risks of transfusion reactions, chills, infection, syndrome of volume overload and risk of hospitalization from various reasons and the patient is willing to proceed and went ahead to sign consent today. I plan to give him a unit of blood after chemo today

## 2016-11-30 NOTE — Telephone Encounter (Signed)
Gave patient relative avs report and appointments for July thru September

## 2016-11-30 NOTE — Patient Instructions (Signed)
[facility] Discharge Instructions for Patients Receiving Chemotherapy  Today you received the following chemotherapy agents Taxol and Carboplatin   To help prevent nausea and vomiting after your treatment, we encourage you to take your nausea medication as directed. No Zofran for 3 days.    If you develop nausea and vomiting that is not controlled by your nausea medication, call the clinic.   BELOW ARE SYMPTOMS THAT SHOULD BE REPORTED IMMEDIATELY:  *FEVER GREATER THAN 100.5 F  *CHILLS WITH OR WITHOUT FEVER  NAUSEA AND VOMITING THAT IS NOT CONTROLLED WITH YOUR NAUSEA MEDICATION  *UNUSUAL SHORTNESS OF BREATH  *UNUSUAL BRUISING OR BLEEDING  TENDERNESS IN MOUTH AND THROAT WITH OR WITHOUT PRESENCE OF ULCERS  *URINARY PROBLEMS  *BOWEL PROBLEMS  UNUSUAL RASH Items with * indicate a potential emergency and should be followed up as soon as possible.  Feel free to call the clinic you have any questions or concerns. The clinic phone number is [phone].  Please show the [place] at check-in to the Emergency Department and triage nurse.   Blood Transfusion, Adult, Care After This sheet gives you information about how to care for yourself after your procedure. Your health care provider may also give you more specific instructions. If you have problems or questions, contact your health care provider. What can I expect after the procedure? After your procedure, it is common to have:  Bruising and soreness where the IV tube was inserted.  Headache.  Follow these instructions at home:  Take over-the-counter and prescription medicines only as told by your health care provider.  Return to your normal activities as told by your health care provider.  Follow instructions from your health care provider about how to take care of your IV insertion site. Make sure you: ? Wash your hands with soap and water before you change your bandage (dressing). If soap and  water are not available, use hand sanitizer. ? Change your dressing as told by your health care provider.  Check your IV insertion site every day for signs of infection. Check for: ? More redness, swelling, or pain. ? More fluid or blood. ? Warmth. ? Pus or a bad smell. Contact a health care provider if:  You have more redness, swelling, or pain around the IV insertion site.  You have more fluid or blood coming from the IV insertion site.  Your IV insertion site feels warm to the touch.  You have pus or a bad smell coming from the IV insertion site.  Your urine turns pink, red, or brown.  You feel weak after doing your normal activities. Get help right away if:  You have signs of a serious allergic or immune system reaction, including: ? Itchiness. ? Hives. ? Trouble breathing. ? Anxiety. ? Chest or lower back pain. ? Fever, flushing, and chills. ? Rapid pulse. ? Rash. ? Diarrhea. ? Vomiting. ? Dark urine. ? Serious headache. ? Dizziness. ? Stiff neck. ? Yellow coloration of the face or the white parts of the eyes (jaundice). This information is not intended to replace advice given to you by your health care provider. Make sure you discuss any questions you have with your health care provider. Document Released: 05/16/2014 Document Revised: 12/23/2015 Document Reviewed: 11/09/2015 Elsevier Interactive Patient Education  [employer].

## 2016-11-30 NOTE — Assessment & Plan Note (Signed)
PET CT scan show positive response to treatment I recommend we continue the same dose of chemotherapy, carboplatin and Taxol on days 1 and 8 and rest day 15 He will get Neulasta support after day 8 treatment I plan to see him back in a few weeks for further supportive care and I plan to repeat imaging study again in October

## 2016-11-30 NOTE — Assessment & Plan Note (Signed)
Repeat PET scan showed no evidence of recurrence of disease

## 2016-11-30 NOTE — Patient Instructions (Signed)

## 2016-11-30 NOTE — Progress Notes (Signed)
[facility] OFFICE PROGRESS NOTE  Patient Care Team: [name], MD as PCP - General (Family Medicine) [name], MD as Consulting Physician (Hematology and Oncology)  SUMMARY OF ONCOLOGIC HISTORY: Oncology History   MDS, R-IPSS score of 3, low risk (Hg 8.9, +16 chromosome on BM, 2% blast count)   Squamous cell carcinoma of the epiglottis   Primary site: Larynx - Supraglottis (Left)   Staging method: AJCC 7th Edition   Clinical: Stage I (T1, N0, M0) signed by [name], MD on 04/30/2013 12:49 PM   Pathologic: Stage I (T1, N0, cM0) signed by [name], MD on 04/30/2013 12:49 PM   Summary: Stage I (T1, N0, cM0)       MDS (myelodysplastic syndrome), low grade ([place])   02/01/2007 Bone Marrow Biopsy    BM biopsy was abnormal, overall probable low grade MDS      03/23/2011 - 02/06/2013 Chemotherapy    [name], discontinued due to diagnosis of laryngeal ca      09/06/2013 Bone Marrow Biopsy    Repeat bone marrow aspirate and biopsy confirmed low-grade myelodysplastic syndrome.      09/17/2013 -  Chemotherapy    The patient resumed darbepoetin injections to treat the anemia.       History of head and neck cancer   03/06/2013 Procedure    Biopsy from epiglottic region showed invasive [facility]      04/25/2013 Imaging    Ct scan showed no other involvement in the LN      05/13/2013 - 06/28/2013 Radiation Therapy    He received radiation therapy, 70 gray in 35 fractions to the larynx      06/09/2016 Imaging    CT scan of the chest showed Bilateral spiculated soft tissue pulmonary masses, highly suspicious for multifocal malignancy. Borderline enlarged left-sided mediastinal lymphadenopathy. Background of severe emphysematous changes, chronic cylindrical bronchiectasis and hyperinflation of the lungs. Findings consistent with longstanding COPD. Diffusely thickened interstitial markings. This may represent chronic interstitial lung changes, however  lymphangitic spread of malignancy cannot be excluded. Anterior compression deformities of several of the thoracic vertebral bodies, with heterogeneous appearance of T7 and T9 vertebral body. This may represent degenerative changes, however osseous metastatic disease cannot be excluded.       07/11/2016 PET scan    There are 2 nodules in the left upper lobe and 2 nodules within the right lower lobe which have spiculated margin and exhibit intense radiotracer uptake compatible with multifocal pulmonary metastasis versus multiple synchronous primary pulmonary neoplasms. 2. Emphysema 3. Aortic atherosclerosis and multi vessel coronary artery calcification.       Cancer of upper lobe of left lung ([place])   06/09/2016 Imaging    CT chest: Bilateral spiculated soft tissue pulmonary masses, highly suspicious for multifocal malignancy. Borderline enlarged left-sided mediastinal lymphadenopathy. Background of severe emphysematous changes, chronic cylindrical bronchiectasis and hyperinflation of the lungs. Findings consistent with longstanding COPD. Diffusely thickened interstitial markings. This may represent chronic interstitial lung changes, however lymphangitic spread of malignancy cannot be excluded. Anterior compression deformities of several of the thoracic vertebral bodies, with heterogeneous appearance of T7 and T9 vertebral body. This may represent degenerative changes, however osseous metastatic disease cannot be excluded.       06/29/2016 Imaging    MRI brain: No evidence of metastatic disease or recent infarction. Advanced generalized brain atrophy with moderate to marked chronic small-vessel ischemic changes throughout. FLAIR imaging appears to show scattered gyri showing FLAIR hyperintensity. These abnormalities are not  confirmed with restricted diffusion or contrast enhancement. Therefore, we are not certain that this is not artifactual. The differential diagnosis does include true pathology such  as posterior reversible encephalopathy, viral or prion disease, postictal change in post chemotherapy change. If there is concern about active CNS pathology, re- scanning in 4-6 weeks could be useful.      07/27/2016 Imaging    CT chest: Bilateral pulmonary lesions. These are suspicious for metastatic disease. Lesions have minimally changed but there may be slight enlargement of the cavitary lesion in the right lower lobe. Severe emphysematous changes.       08/10/2016 Pathology Results    1. Lung, biopsy, RUL, (apical segment) - SCANT BENIGN LUNG PARENCHYMA. - THERE IS NO EVIDENCE OF MALIGNANCY. 2. Lung, biopsy, LUL lingula - SQUAMOUS CELL CARCINOMA. - SEE COMMENT. 3. Lung, biopsy, LUL - ADENOCARCINOMA. - SEE COMMENT. Microscopic Comment 2. The malignant cells are positive for cytokeratin 56 and p63. They are negative for TTF-1. The findings are consistent with squamous cell carcinoma. There is likely insufficient tissue remaining for additional studies, if requested. 3. The malignant cells are positive for TTF-1 and negative for p63 and cytokeratin 5/6. The findings are consistent with adenocarcinoma.      08/10/2016 Procedure    The targets were defined as follows: Left upper lobe nodule, target 1 Lingular nodule, target 2 Proximal right lower lobe nodule, target 3 More distal right lower lobe, nodule  The extendable working channel was secured into place and the locator guide was withdrawn. Under fluoroscopic guidance transbronchial needle brushings, transbronchial Wang needle biopsies, and transbronchial forceps biopsies were performed in the LUL (target 1) and the lingula (target 2) to be sent for cytology and pathology. A bronchioalveolar lavage was performed in the lingula in the vicinity of target 2  and sent for cytology and microbiology (bacterial, fungal, AFB smears and cultures).  Fiducial markers were then placed around target 1, target 2, and in the right lower lobe in the  vicinity of both target 3 and target 4 to be used for possible radiation therapy should this be indicated. At the end of the procedure a general airway inspection was performed and there was no evidence of active bleeding. The bronchoscope was removed.  The patient tolerated the procedure well. There was no significant blood loss and there were no obvious complications. A post-procedural chest x-ray is pending.  Samples: 1. Transbronchial needle brushings from targets 1 and 2 2. Transbronchial Wang needle biopsies from targets 1 and 2 3. Transbronchial forceps biopsies from targets 1 and 2 4. Bronchoalveolar lavage from lingula, target 2 5. Endobronchial biopsies from RUL apical segment 6. Endobronchial brushings from right upper lobe apical segment       08/10/2016 Genetic Testing    Patient has genetic testing done for [employer] and PD-L1 testing Results revealed PD-L1 testing is negative 0%      08/31/2016 Procedure    Technically successful right IJ power-injectable port catheter placement. Ready for routine use      11/30/2016 PET scan    1. Interval resection of the hypermetabolic lingular nodule. 2. The 3 other spiculated, hypermetabolic nodules within the left upper and right lower lobes are slightly smaller with decreased metabolic activity, consistent with some response to interval therapy. 3. No progressive disease       INTERVAL HISTORY: Please see below for problem oriented charting. He returns with his wife for further follow-up He is doing well Denies chest pain or shortness of  breath He complain of fatigue He denies recent smoking or drinking No recent infection The patient denies any recent signs or symptoms of bleeding such as spontaneous epistaxis, hematuria or hematochezia. He denies cough. He denies peripheral neuropathy  REVIEW OF SYSTEMS:   Constitutional: Denies fevers, chills or abnormal weight loss Eyes: Denies blurriness of vision Ears, nose,  mouth, throat, and face: Denies mucositis or sore throat Respiratory: Denies cough, dyspnea or wheezes Cardiovascular: Denies palpitation, chest discomfort or lower extremity swelling Gastrointestinal:  Denies nausea, heartburn or change in bowel habits Skin: Denies abnormal skin rashes Lymphatics: Denies new lymphadenopathy or easy bruising Neurological:Denies numbness, tingling or new weaknesses Behavioral/Psych: Mood is stable, no new changes  All other systems were reviewed with the patient and are negative.  I have reviewed the past medical history, past surgical history, social history and family history with the patient and they are unchanged from previous note.  ALLERGIES:  is allergic to no known allergies.  MEDICATIONS:  Current Outpatient Prescriptions  Medication Sig Dispense Refill  . albuterol (PROVENTIL HFA;VENTOLIN HFA) 108 (90 Base) MCG/ACT inhaler Inhale 2 puffs into the lungs every 4 (four) hours as needed for wheezing or shortness of breath. 1 Inhaler 5  . cyanocobalamin 500 MCG tablet Take 500 mcg by mouth daily.    . ferrous sulfate 325 (65 FE) MG tablet Take 325 mg by mouth daily with breakfast.    . folic acid (FOLVITE) 1 MG tablet Take 1 mg by mouth daily.    . Glycopyrrolate-Formoterol (BEVESPI AEROSPHERE) 9-4.8 MCG/ACT AERO Inhale 2 puffs into the lungs 2 (two) times daily. 2 Inhaler 0  . levETIRAcetam (KEPPRA) 500 MG tablet Take 1 tablet (500 mg total) by mouth 2 (two) times daily. 60 tablet 11  . lidocaine-prilocaine (EMLA) cream Apply to affected area once 30 g 3  . ondansetron (ZOFRAN) 8 MG tablet Take 1 tablet (8 mg total) by mouth 2 (two) times daily as needed for refractory nausea / vomiting. Start on day 3 after chemo. 30 tablet 1  . vitamin E 1000 UNIT capsule Take 1,000 Units by mouth daily.      No current facility-administered medications for this visit.    Facility-Administered Medications Ordered in Other Visits  Medication Dose Route Frequency  Provider Last Rate Last Dose  . heparin lock flush 100 unit/mL  500 Units Intracatheter Once PRN Alvy Bimler, Caree Wolpert, MD      . sodium chloride flush (NS) 0.9 % injection 10 mL  10 mL Intracatheter PRN Alvy Bimler, Jiro Kiester, MD        PHYSICAL EXAMINATION: ECOG PERFORMANCE STATUS: 1 - Symptomatic but completely ambulatory  Vitals:   11/30/16 0927  BP: 111/61  Pulse: 81  Resp: 18  Temp: 97.7 F (36.5 C)   Filed Weights   11/30/16 0927  Weight: 110 lb 9.6 oz (50.2 kg)    GENERAL:alert, no distress and comfortable.  He looks thin and cachectic SKIN: skin color, texture, turgor are normal, no rashes or significant lesions EYES: normal, Conjunctiva are pink and non-injected, sclera clear OROPHARYNX:no exudate, no erythema and lips, buccal mucosa, and tongue normal  NECK: supple, thyroid normal size, non-tender, without nodularity LYMPH:  no palpable lymphadenopathy in the cervical, axillary or inguinal LUNGS: clear to auscultation and percussion with normal breathing effort HEART: regular rate & rhythm and no murmurs and no lower extremity edema ABDOMEN:abdomen soft, non-tender and normal bowel sounds Musculoskeletal:no cyanosis of digits and no clubbing  NEURO: alert & oriented x 3 with fluent  speech, no focal motor/sensory deficits  LABORATORY DATA:  I have reviewed the data as listed    Component Value Date/Time   NA 138 11/30/2016 0853   K 4.8 11/30/2016 0853   CL 108 08/31/2016 1155   CL 107 09/19/2012 1325   CO2 21 (L) 11/30/2016 0853   GLUCOSE 88 11/30/2016 0853   GLUCOSE 97 09/19/2012 1325   BUN 24.7 11/30/2016 0853   CREATININE 0.9 11/30/2016 0853   CALCIUM 9.0 11/30/2016 0853   PROT 7.8 11/30/2016 0853   ALBUMIN 3.2 (L) 11/30/2016 0853   AST 8 11/30/2016 0853   ALT <6 11/30/2016 0853   ALKPHOS 96 11/30/2016 0853   BILITOT 0.45 11/30/2016 0853   GFRNONAA >60 08/31/2016 1155   GFRAA >60 08/31/2016 1155    No results found for: SPEP, UPEP  Lab Results  Component Value Date    WBC 8.2 11/30/2016   NEUTROABS 7.1 (H) 11/30/2016   HGB 8.1 (L) 11/30/2016   HCT 25.8 (L) 11/30/2016   MCV 103.6 (H) 11/30/2016   PLT 165 11/30/2016      Chemistry      Component Value Date/Time   NA 138 11/30/2016 0853   K 4.8 11/30/2016 0853   CL 108 08/31/2016 1155   CL 107 09/19/2012 1325   CO2 21 (L) 11/30/2016 0853   BUN 24.7 11/30/2016 0853   CREATININE 0.9 11/30/2016 0853      Component Value Date/Time   CALCIUM 9.0 11/30/2016 0853   ALKPHOS 96 11/30/2016 0853   AST 8 11/30/2016 0853   ALT <6 11/30/2016 0853   BILITOT 0.45 11/30/2016 0853       RADIOGRAPHIC STUDIES: I reviewed imaging study with him and his wife I have personally reviewed the radiological images as listed and agreed with the findings in the report. Nm Pet Image Restag (ps) Skull Base To Thigh  Result Date: 11/30/2016 CLINICAL DATA:  Subsequent treatment strategy for left lung cancer. EXAM: NUCLEAR MEDICINE PET SKULL BASE TO THIGH TECHNIQUE: 4.6 mCi F-18 FDG was injected intravenously. Full-ring PET imaging was performed from the skull base to thigh after the radiotracer. CT data was obtained and used for attenuation correction and anatomic localization. FASTING BLOOD GLUCOSE:  Value: 81 Mg/dl COMPARISON:  PET-CT 07/11/2016.  Chest CT 06-24-2016. FINDINGS: NECK No hypermetabolic cervical lymph nodes are identified.There are no lesions of the pharyngeal mucosal space. There is prominent symmetric parotid activity bilaterally. CHEST There are no hypermetabolic mediastinal, hilar or axillary lymph nodes. The previously demonstrated spiculated, hypermetabolic nodule within the lingula has been resected. Spiculated left upper lobe lesion is slightly smaller, measuring 2.2 x 1.9 cm on image 27 (previously up to 2.5 cm). This remains hypermetabolic with an SUV max of 9.0 (previously 11.9). The 2 adjacent, spiculated hypermetabolic right lower lobe nodules have changed little in size or appearance. The more  superior 1 measures 1.9 x 2.1 cm on image 46 (SUV max 9.8). The more inferior 1 measures 1.7 x 2.1 cm on image 50 (SUV max 7.1). No new or other suspicious pulmonary nodule identified. There is advanced centrilobular emphysema with right upper lobe scarring. Port-A-Cath remains in place. There is mild atherosclerosis. ABDOMEN/PELVIS There is no hypermetabolic activity within the liver, adrenal glands, spleen or pancreas. There is no hypermetabolic nodal activity. Aortic and branch vessel atherosclerosis noted. SKELETON There is no focal hypermetabolic activity to suggest metastatic disease. Generalized increased marrow activity is noted, likely response to therapy. IMPRESSION: 1. Interval resection of the hypermetabolic lingular nodule.  2. The 3 other spiculated, hypermetabolic nodules within the left upper and right lower lobes are slightly smaller with decreased metabolic activity, consistent with some response to interval therapy. 3. No progressive disease. Electronically Signed   By: Richardean Sale M.D.   On: 11/30/2016 08:40    ASSESSMENT & PLAN:  Cancer of upper lobe of left lung ([facility]) PET CT scan show positive response to treatment I recommend we continue the same dose of chemotherapy, carboplatin and Taxol on days 1 and 8 and rest day 15 He will get Neulasta support after day 8 treatment I plan to see him back in a few weeks for further supportive care and I plan to repeat imaging study again in October   History of head and neck cancer Repeat PET scan showed no evidence of recurrence of disease  MDS (myelodysplastic syndrome), low grade The anemia from MDS is stable. I suspect he may have some degree of bone marrow suppression from his antiseizure medications. He had responded well to Darbopeitin in the past with improvement in energy level.  The patient will get regular blood work done. The patient will continue on Aranep injection to keep hemoglobin greater than 10 g. He will continue  to get darbepoetin injection while on treatment, scheduled to be given every 2 weeks. He had recently received blood transfusion with symptomatic improvement.  He will continue to receive blood if hemoglobin is less than 8 Today, he is mildly symptomatic. We discussed some of the risks, benefits, and alternatives of blood transfusions. The patient is symptomatic from anemia and the hemoglobin level is critically low.  Some of the side-effects to be expected including risks of transfusion reactions, chills, infection, syndrome of volume overload and risk of hospitalization from various reasons and the patient is willing to proceed and went ahead to sign consent today. I plan to give him a unit of blood after chemo today   No orders of the defined types were placed in this encounter.  All questions were answered. The patient knows to call the clinic with any problems, questions or concerns. No barriers to learning was detected. I spent 25 minutes counseling the patient face to face. The total time spent in the appointment was 40 minutes and more than 50% was on counseling and review of test results     [name], MD 11/30/2016 2:43 PM

## 2016-12-01 LAB — BPAM RBC
BLOOD PRODUCT EXPIRATION DATE: 201808182359
ISSUE DATE / TIME: 201807251223
UNIT TYPE AND RH: 5100

## 2016-12-01 LAB — TYPE AND SCREEN
ABO/RH(D): O POS
ANTIBODY SCREEN: NEGATIVE
Donor AG Type: NEGATIVE
UNIT DIVISION: 0

## 2016-12-02 ENCOUNTER — Ambulatory Visit (HOSPITAL_BASED_OUTPATIENT_CLINIC_OR_DEPARTMENT_OTHER): Payer: Medicare Other

## 2016-12-02 VITALS — BP 117/80 | HR 84 | Temp 98.0°F | Resp 18

## 2016-12-02 DIAGNOSIS — C3412 Malignant neoplasm of upper lobe, left bronchus or lung: Secondary | ICD-10-CM | POA: Diagnosis not present

## 2016-12-02 DIAGNOSIS — Z5189 Encounter for other specified aftercare: Secondary | ICD-10-CM

## 2016-12-02 DIAGNOSIS — C801 Malignant (primary) neoplasm, unspecified: Secondary | ICD-10-CM

## 2016-12-02 DIAGNOSIS — C7802 Secondary malignant neoplasm of left lung: Secondary | ICD-10-CM

## 2016-12-02 MED ORDER — PEGFILGRASTIM INJECTION 6 MG/0.6ML ~~LOC~~
6.0000 mg | PREFILLED_SYRINGE | Freq: Once | SUBCUTANEOUS | Status: AC
  Administered 2016-12-02: 6 mg via SUBCUTANEOUS
  Filled 2016-12-02: qty 0.6

## 2016-12-02 NOTE — Patient Instructions (Signed)
Pegfilgrastim injection What is this medicine? PEGFILGRASTIM (PEG fil gra stim) is a long-acting granulocyte colony-stimulating factor that stimulates the growth of neutrophils, a type of white blood cell important in the body's fight against infection. It is used to reduce the incidence of fever and infection in patients with certain types of cancer who are receiving chemotherapy that affects the bone marrow, and to increase survival after being exposed to high doses of radiation. This medicine may be used for other purposes; ask your health care provider or pharmacist if you have questions. COMMON BRAND NAME(S): Neulasta What should I tell my health care provider before I take this medicine? They need to know if you have any of these conditions: -kidney disease -latex allergy -ongoing radiation therapy -sickle cell disease -skin reactions to acrylic adhesives (On-Body Injector only) -an unusual or allergic reaction to pegfilgrastim, filgrastim, other medicines, foods, dyes, or preservatives -pregnant or trying to get pregnant -breast-feeding How should I use this medicine? This medicine is for injection under the skin. If you get this medicine at home, you will be taught how to prepare and give the pre-filled syringe or how to use the On-body Injector. Refer to the patient Instructions for Use for detailed instructions. Use exactly as directed. Tell your healthcare provider immediately if you suspect that the On-body Injector may not have performed as intended or if you suspect the use of the On-body Injector resulted in a missed or partial dose. It is important that you put your used needles and syringes in a special sharps container. Do not put them in a trash can. If you do not have a sharps container, call your pharmacist or healthcare provider to get one. Talk to your pediatrician regarding the use of this medicine in children. While this drug may be prescribed for selected conditions,  precautions do apply. Overdosage: If you think you have taken too much of this medicine contact a poison control center or emergency room at once. NOTE: This medicine is only for you. Do not share this medicine with others. What if I miss a dose? It is important not to miss your dose. Call your doctor or health care professional if you miss your dose. If you miss a dose due to an On-body Injector failure or leakage, a new dose should be administered as soon as possible using a single prefilled syringe for manual use. What may interact with this medicine? Interactions have not been studied. Give your health care provider a list of all the medicines, herbs, non-prescription drugs, or dietary supplements you use. Also tell them if you smoke, drink alcohol, or use illegal drugs. Some items may interact with your medicine. This list may not describe all possible interactions. Give your health care provider a list of all the medicines, herbs, non-prescription drugs, or dietary supplements you use. Also tell them if you smoke, drink alcohol, or use illegal drugs. Some items may interact with your medicine. What should I watch for while using this medicine? You may need blood work done while you are taking this medicine. If you are going to need a MRI, CT scan, or other procedure, tell your doctor that you are using this medicine (On-Body Injector only). What side effects may I notice from receiving this medicine? Side effects that you should report to your doctor or health care professional as soon as possible: -allergic reactions like skin rash, itching or hives, swelling of the face, lips, or tongue -dizziness -fever -pain, redness, or irritation at site   where injected -pinpoint red spots on the skin -red or dark-brown urine -shortness of breath or breathing problems -stomach or side pain, or pain at the shoulder -swelling -tiredness -trouble passing urine or change in the amount of urine Side  effects that usually do not require medical attention (report to your doctor or health care professional if they continue or are bothersome): -bone pain -muscle pain This list may not describe all possible side effects. Call your doctor for medical advice about side effects. You may report side effects to FDA at 1-800-FDA-1088. Where should I keep my medicine? Keep out of the reach of children. Store pre-filled syringes in a refrigerator between 2 and 8 degrees C (36 and 46 degrees F). Do not freeze. Keep in carton to protect from light. Throw away this medicine if it is left out of the refrigerator for more than 48 hours. Throw away any unused medicine after the expiration date. NOTE: This sheet is a summary. It may not cover all possible information. If you have questions about this medicine, talk to your doctor, pharmacist, or health care provider.  2018 Elsevier/Gold Standard (2016-04-21 12:58:03)  

## 2016-12-03 ENCOUNTER — Other Ambulatory Visit: Payer: Self-pay | Admitting: Neurology

## 2016-12-03 ENCOUNTER — Other Ambulatory Visit: Payer: Self-pay | Admitting: Hematology and Oncology

## 2016-12-05 ENCOUNTER — Other Ambulatory Visit: Payer: Self-pay

## 2016-12-05 MED ORDER — LEVETIRACETAM 500 MG PO TABS: 500.0000 mg | ORAL_TABLET | Freq: Two times a day (BID) | ORAL | 3 refills | Status: DC

## 2016-12-14 ENCOUNTER — Ambulatory Visit (HOSPITAL_BASED_OUTPATIENT_CLINIC_OR_DEPARTMENT_OTHER): Payer: Medicare Other

## 2016-12-14 ENCOUNTER — Other Ambulatory Visit (HOSPITAL_BASED_OUTPATIENT_CLINIC_OR_DEPARTMENT_OTHER): Payer: Medicare Other

## 2016-12-14 VITALS — BP 121/62 | HR 71 | Temp 98.1°F | Resp 18

## 2016-12-14 DIAGNOSIS — D4620 Refractory anemia with excess of blasts, unspecified: Secondary | ICD-10-CM | POA: Diagnosis not present

## 2016-12-14 DIAGNOSIS — C7802 Secondary malignant neoplasm of left lung: Secondary | ICD-10-CM

## 2016-12-14 DIAGNOSIS — C801 Malignant (primary) neoplasm, unspecified: Secondary | ICD-10-CM

## 2016-12-14 DIAGNOSIS — C3412 Malignant neoplasm of upper lobe, left bronchus or lung: Secondary | ICD-10-CM

## 2016-12-14 DIAGNOSIS — Z5111 Encounter for antineoplastic chemotherapy: Secondary | ICD-10-CM | POA: Diagnosis not present

## 2016-12-14 LAB — CBC WITH DIFFERENTIAL/PLATELET
BASO%: 0.1 % (ref 0.0–2.0)
BASOS ABS: 0 10*3/uL (ref 0.0–0.1)
EOS ABS: 0 10*3/uL (ref 0.0–0.5)
EOS%: 0.1 % (ref 0.0–7.0)
HEMATOCRIT: 29.6 % — AB (ref 38.4–49.9)
HGB: 9.3 g/dL — ABNORMAL LOW (ref 13.0–17.1)
LYMPH%: 2.6 % — ABNORMAL LOW (ref 14.0–49.0)
MCH: 32 pg (ref 27.2–33.4)
MCHC: 31.4 g/dL — ABNORMAL LOW (ref 32.0–36.0)
MCV: 101.7 fL — AB (ref 79.3–98.0)
MONO#: 1.1 10*3/uL — ABNORMAL HIGH (ref 0.1–0.9)
MONO%: 5.2 % (ref 0.0–14.0)
NEUT#: 19.6 10*3/uL — ABNORMAL HIGH (ref 1.5–6.5)
NEUT%: 92 % — AB (ref 39.0–75.0)
PLATELETS: 208 10*3/uL (ref 140–400)
RBC: 2.91 10*6/uL — ABNORMAL LOW (ref 4.20–5.82)
RDW: 25.2 % — ABNORMAL HIGH (ref 11.0–14.6)
WBC: 21.3 10*3/uL — ABNORMAL HIGH (ref 4.0–10.3)
lymph#: 0.6 10*3/uL — ABNORMAL LOW (ref 0.9–3.3)

## 2016-12-14 LAB — COMPREHENSIVE METABOLIC PANEL
AST: 10 U/L (ref 5–34)
Albumin: 3.2 g/dL — ABNORMAL LOW (ref 3.5–5.0)
Alkaline Phosphatase: 107 U/L (ref 40–150)
Anion Gap: 8 mEq/L (ref 3–11)
BILIRUBIN TOTAL: 0.43 mg/dL (ref 0.20–1.20)
BUN: 23.3 mg/dL (ref 7.0–26.0)
CO2: 20 meq/L — AB (ref 22–29)
CREATININE: 0.8 mg/dL (ref 0.7–1.3)
Calcium: 8.9 mg/dL (ref 8.4–10.4)
Chloride: 110 mEq/L — ABNORMAL HIGH (ref 98–109)
EGFR: >90 mL/min/{1.73_m2} (ref >90)
GLUCOSE: 86 mg/dL (ref 70–140)
Potassium: 5 mEq/L (ref 3.5–5.1)
SODIUM: 138 meq/L (ref 136–145)
TOTAL PROTEIN: 7.8 g/dL (ref 6.4–8.3)

## 2016-12-14 MED ORDER — FAMOTIDINE IN NACL 20-0.9 MG/50ML-% IV SOLN
20.0000 mg | Freq: Once | INTRAVENOUS | Status: AC
  Administered 2016-12-14: 20 mg via INTRAVENOUS

## 2016-12-14 MED ORDER — SODIUM CHLORIDE 0.9 % IV SOLN
152.0000 mg | Freq: Once | INTRAVENOUS | Status: AC
  Administered 2016-12-14: 150 mg via INTRAVENOUS
  Filled 2016-12-14: qty 15

## 2016-12-14 MED ORDER — HEPARIN SOD (PORK) LOCK FLUSH 100 UNIT/ML IV SOLN
500.0000 [IU] | Freq: Once | INTRAVENOUS | Status: AC | PRN
  Administered 2016-12-14: 500 [IU]
  Filled 2016-12-14: qty 5

## 2016-12-14 MED ORDER — SODIUM CHLORIDE 0.9% FLUSH
10.0000 mL | INTRAVENOUS | Status: DC | PRN
  Administered 2016-12-14: 10 mL
  Filled 2016-12-14: qty 10

## 2016-12-14 MED ORDER — PALONOSETRON HCL INJECTION 0.25 MG/5ML
0.2500 mg | Freq: Once | INTRAVENOUS | Status: AC
  Administered 2016-12-14: 0.25 mg via INTRAVENOUS

## 2016-12-14 MED ORDER — SODIUM CHLORIDE 0.9 % IV SOLN
20.0000 mg | Freq: Once | INTRAVENOUS | Status: AC
  Administered 2016-12-14: 20 mg via INTRAVENOUS
  Filled 2016-12-14: qty 2

## 2016-12-14 MED ORDER — PACLITAXEL CHEMO INJECTION 300 MG/50ML
45.0000 mg/m2 | Freq: Once | INTRAVENOUS | Status: AC
  Administered 2016-12-14: 72 mg via INTRAVENOUS
  Filled 2016-12-14: qty 12

## 2016-12-14 MED ORDER — DIPHENHYDRAMINE HCL 50 MG/ML IJ SOLN
INTRAMUSCULAR | Status: AC
  Filled 2016-12-14: qty 1

## 2016-12-14 MED ORDER — DARBEPOETIN ALFA 500 MCG/ML IJ SOSY
500.0000 ug | PREFILLED_SYRINGE | Freq: Once | INTRAMUSCULAR | Status: AC
  Administered 2016-12-14: 500 ug via SUBCUTANEOUS
  Filled 2016-12-14: qty 1

## 2016-12-14 MED ORDER — PALONOSETRON HCL INJECTION 0.25 MG/5ML
INTRAVENOUS | Status: AC
  Filled 2016-12-14: qty 5

## 2016-12-14 MED ORDER — SODIUM CHLORIDE 0.9 % IV SOLN
Freq: Once | INTRAVENOUS | Status: AC
  Administered 2016-12-14: 10:00:00 via INTRAVENOUS

## 2016-12-14 MED ORDER — FAMOTIDINE IN NACL 20-0.9 MG/50ML-% IV SOLN
INTRAVENOUS | Status: AC
  Filled 2016-12-14: qty 50

## 2016-12-14 MED ORDER — DIPHENHYDRAMINE HCL 50 MG/ML IJ SOLN
50.0000 mg | Freq: Once | INTRAMUSCULAR | Status: AC
  Administered 2016-12-14: 50 mg via INTRAVENOUS

## 2016-12-14 NOTE — Patient Instructions (Signed)
Carboplatin injection What is this medicine? CARBOPLATIN (KAR boe pla tin) is a chemotherapy drug. It targets fast dividing cells, like cancer cells, and causes these cells to die. This medicine is used to treat ovarian cancer and many other cancers. This medicine may be used for other purposes; ask your health care provider or pharmacist if you have questions. COMMON BRAND NAME(S): Paraplatin What should I tell my health care provider before I take this medicine? They need to know if you have any of these conditions: -blood disorders -hearing problems -kidney disease -recent or ongoing radiation therapy -an unusual or allergic reaction to carboplatin, cisplatin, other chemotherapy, other medicines, foods, dyes, or preservatives -pregnant or trying to get pregnant -breast-feeding How should I use this medicine? This drug is usually given as an infusion into a vein. It is administered in a hospital or clinic by a specially trained health care professional. Talk to your pediatrician regarding the use of this medicine in children. Special care may be needed. Overdosage: If you think you have taken too much of this medicine contact a poison control center or emergency room at once. NOTE: This medicine is only for you. Do not share this medicine with others. What if I miss a dose? It is important not to miss a dose. Call your doctor or health care professional if you are unable to keep an appointment. What may interact with this medicine? -medicines for seizures -medicines to increase blood counts like filgrastim, pegfilgrastim, sargramostim -some antibiotics like amikacin, gentamicin, neomycin, streptomycin, tobramycin -vaccines Talk to your doctor or health care professional before taking any of these medicines: -acetaminophen -aspirin -ibuprofen -ketoprofen -naproxen This list may not describe all possible interactions. Give your health care provider a list of all the medicines, herbs,  non-prescription drugs, or dietary supplements you use. Also tell them if you smoke, drink alcohol, or use illegal drugs. Some items may interact with your medicine. What should I watch for while using this medicine? Your condition will be monitored carefully while you are receiving this medicine. You will need important blood work done while you are taking this medicine. This drug may make you feel generally unwell. This is not uncommon, as chemotherapy can affect healthy cells as well as cancer cells. Report any side effects. Continue your course of treatment even though you feel ill unless your doctor tells you to stop. In some cases, you may be given additional medicines to help with side effects. Follow all directions for their use. Call your doctor or health care professional for advice if you get a fever, chills or sore throat, or other symptoms of a cold or flu. Do not treat yourself. This drug decreases your body's ability to fight infections. Try to avoid being around people who are sick. This medicine may increase your risk to bruise or bleed. Call your doctor or health care professional if you notice any unusual bleeding. Be careful brushing and flossing your teeth or using a toothpick because you may get an infection or bleed more easily. If you have any dental work done, tell your dentist you are receiving this medicine. Avoid taking products that contain aspirin, acetaminophen, ibuprofen, naproxen, or ketoprofen unless instructed by your doctor. These medicines may hide a fever. Do not become pregnant while taking this medicine. Women should inform their doctor if they wish to become pregnant or think they might be pregnant. There is a potential for serious side effects to an unborn child. Talk to your health care professional or  pharmacist for more information. Do not breast-feed an infant while taking this medicine. What side effects may I notice from receiving this medicine? Side effects  that you should report to your doctor or health care professional as soon as possible: -allergic reactions like skin rash, itching or hives, swelling of the face, lips, or tongue -signs of infection - fever or chills, cough, sore throat, pain or difficulty passing urine -signs of decreased platelets or bleeding - bruising, pinpoint red spots on the skin, black, tarry stools, nosebleeds -signs of decreased red blood cells - unusually weak or tired, fainting spells, lightheadedness -breathing problems -changes in hearing -changes in vision -chest pain -high blood pressure -low blood counts - This drug may decrease the number of white blood cells, red blood cells and platelets. You may be at increased risk for infections and bleeding. -nausea and vomiting -pain, swelling, redness or irritation at the injection site -pain, tingling, numbness in the hands or feet -problems with balance, talking, walking -trouble passing urine or change in the amount of urine Side effects that usually do not require medical attention (report to your doctor or health care professional if they continue or are bothersome): -hair loss -loss of appetite -metallic taste in the mouth or changes in taste This list may not describe all possible side effects. Call your doctor for medical advice about side effects. You may report side effects to FDA at 1-800-FDA-1088. Where should I keep my medicine? This drug is given in a hospital or clinic and will not be stored at home. NOTE: This sheet is a summary. It may not cover all possible information. If you have questions about this medicine, talk to your doctor, pharmacist, or health care provider.  2018 Elsevier/Gold Standard (2007-07-31 14:38:05) Paclitaxel injection What is this medicine? PACLITAXEL (PAK li TAX el) is a chemotherapy drug. It targets fast dividing cells, like cancer cells, and causes these cells to die. This medicine is used to treat ovarian cancer, breast  cancer, and other cancers. This medicine may be used for other purposes; ask your health care provider or pharmacist if you have questions. COMMON BRAND NAME(S): Onxol, Taxol What should I tell my health care provider before I take this medicine? They need to know if you have any of these conditions: -blood disorders -irregular heartbeat -infection (especially a virus infection such as chickenpox, cold sores, or herpes) -liver disease -previous or ongoing radiation therapy -an unusual or allergic reaction to paclitaxel, alcohol, polyoxyethylated castor oil, other chemotherapy agents, other medicines, foods, dyes, or preservatives -pregnant or trying to get pregnant -breast-feeding How should I use this medicine? This drug is given as an infusion into a vein. It is administered in a hospital or clinic by a specially trained health care professional. Talk to your pediatrician regarding the use of this medicine in children. Special care may be needed. Overdosage: If you think you have taken too much of this medicine contact a poison control center or emergency room at once. NOTE: This medicine is only for you. Do not share this medicine with others. What if I miss a dose? It is important not to miss your dose. Call your doctor or health care professional if you are unable to keep an appointment. What may interact with this medicine? Do not take this medicine with any of the following medications: -disulfiram -metronidazole This medicine may also interact with the following medications: -cyclosporine -diazepam -ketoconazole -medicines to increase blood counts like filgrastim, pegfilgrastim, sargramostim -other chemotherapy drugs like cisplatin,  doxorubicin, epirubicin, etoposide, teniposide, vincristine -quinidine -testosterone -vaccines -verapamil Talk to your doctor or health care professional before taking any of these  medicines: -acetaminophen -aspirin -ibuprofen -ketoprofen -naproxen This list may not describe all possible interactions. Give your health care provider a list of all the medicines, herbs, non-prescription drugs, or dietary supplements you use. Also tell them if you smoke, drink alcohol, or use illegal drugs. Some items may interact with your medicine. What should I watch for while using this medicine? Your condition will be monitored carefully while you are receiving this medicine. You will need important blood work done while you are taking this medicine. This medicine can cause serious allergic reactions. To reduce your risk you will need to take other medicine(s) before treatment with this medicine. If you experience allergic reactions like skin rash, itching or hives, swelling of the face, lips, or tongue, tell your doctor or health care professional right away. In some cases, you may be given additional medicines to help with side effects. Follow all directions for their use. This drug may make you feel generally unwell. This is not uncommon, as chemotherapy can affect healthy cells as well as cancer cells. Report any side effects. Continue your course of treatment even though you feel ill unless your doctor tells you to stop. Call your doctor or health care professional for advice if you get a fever, chills or sore throat, or other symptoms of a cold or flu. Do not treat yourself. This drug decreases your body's ability to fight infections. Try to avoid being around people who are sick. This medicine may increase your risk to bruise or bleed. Call your doctor or health care professional if you notice any unusual bleeding. Be careful brushing and flossing your teeth or using a toothpick because you may get an infection or bleed more easily. If you have any dental work done, tell your dentist you are receiving this medicine. Avoid taking products that contain aspirin, acetaminophen, ibuprofen,  naproxen, or ketoprofen unless instructed by your doctor. These medicines may hide a fever. Do not become pregnant while taking this medicine. Women should inform their doctor if they wish to become pregnant or think they might be pregnant. There is a potential for serious side effects to an unborn child. Talk to your health care professional or pharmacist for more information. Do not breast-feed an infant while taking this medicine. Men are advised not to father a child while receiving this medicine. This product may contain alcohol. Ask your pharmacist or healthcare provider if this medicine contains alcohol. Be sure to tell all healthcare providers you are taking this medicine. Certain medicines, like metronidazole and disulfiram, can cause an unpleasant reaction when taken with alcohol. The reaction includes flushing, headache, nausea, vomiting, sweating, and increased thirst. The reaction can last from 30 minutes to several hours. What side effects may I notice from receiving this medicine? Side effects that you should report to your doctor or health care professional as soon as possible: -allergic reactions like skin rash, itching or hives, swelling of the face, lips, or tongue -low blood counts - This drug may decrease the number of white blood cells, red blood cells and platelets. You may be at increased risk for infections and bleeding. -signs of infection - fever or chills, cough, sore throat, pain or difficulty passing urine -signs of decreased platelets or bleeding - bruising, pinpoint red spots on the skin, black, tarry stools, nosebleeds -signs of decreased red blood cells - unusually weak or  tired, fainting spells, lightheadedness -breathing problems -chest pain -high or low blood pressure -mouth sores -nausea and vomiting -pain, swelling, redness or irritation at the injection site -pain, tingling, numbness in the hands or feet -slow or irregular heartbeat -swelling of the ankle,  feet, hands Side effects that usually do not require medical attention (report to your doctor or health care professional if they continue or are bothersome): -bone pain -complete hair loss including hair on your head, underarms, pubic hair, eyebrows, and eyelashes -changes in the color of fingernails -diarrhea -loosening of the fingernails -loss of appetite -muscle or joint pain -red flush to skin -sweating This list may not describe all possible side effects. Call your doctor for medical advice about side effects. You may report side effects to FDA at 1-800-FDA-1088. Where should I keep my medicine? This drug is given in a hospital or clinic and will not be stored at home. NOTE: This sheet is a summary. It may not cover all possible information. If you have questions about this medicine, talk to your doctor, pharmacist, or health care provider.  2018 Elsevier/Gold Standard (2015-02-24 19:58:00)

## 2016-12-21 ENCOUNTER — Telehealth: Payer: Self-pay | Admitting: Hematology and Oncology

## 2016-12-21 ENCOUNTER — Ambulatory Visit (HOSPITAL_BASED_OUTPATIENT_CLINIC_OR_DEPARTMENT_OTHER): Payer: Medicare Other | Admitting: Hematology and Oncology

## 2016-12-21 ENCOUNTER — Ambulatory Visit (HOSPITAL_COMMUNITY)
Admission: RE | Admit: 2016-12-21 | Discharge: 2016-12-21 | Disposition: A | Discharge status: Home / Self Care | Payer: Medicare Other | Source: Ambulatory Visit | Attending: Hematology and Oncology | Admitting: Hematology and Oncology

## 2016-12-21 ENCOUNTER — Ambulatory Visit (HOSPITAL_BASED_OUTPATIENT_CLINIC_OR_DEPARTMENT_OTHER): Payer: Medicare Other

## 2016-12-21 ENCOUNTER — Ambulatory Visit: Payer: Medicare Other

## 2016-12-21 ENCOUNTER — Other Ambulatory Visit (HOSPITAL_BASED_OUTPATIENT_CLINIC_OR_DEPARTMENT_OTHER): Payer: Medicare Other

## 2016-12-21 VITALS — BP 120/57 | HR 80 | Temp 97.7°F | Resp 18 | Ht 72.0 in | Wt 108.9 lb

## 2016-12-21 VITALS — BP 154/71 | HR 67 | Temp 97.7°F | Resp 16

## 2016-12-21 DIAGNOSIS — Z5111 Encounter for antineoplastic chemotherapy: Secondary | ICD-10-CM

## 2016-12-21 DIAGNOSIS — R64 Cachexia: Secondary | ICD-10-CM

## 2016-12-21 DIAGNOSIS — D6481 Anemia due to antineoplastic chemotherapy: Secondary | ICD-10-CM

## 2016-12-21 DIAGNOSIS — C3412 Malignant neoplasm of upper lobe, left bronchus or lung: Secondary | ICD-10-CM

## 2016-12-21 DIAGNOSIS — T451X5A Adverse effect of antineoplastic and immunosuppressive drugs, initial encounter: Secondary | ICD-10-CM | POA: Diagnosis not present

## 2016-12-21 DIAGNOSIS — D4620 Refractory anemia with excess of blasts, unspecified: Secondary | ICD-10-CM

## 2016-12-21 DIAGNOSIS — D61818 Other pancytopenia: Secondary | ICD-10-CM

## 2016-12-21 DIAGNOSIS — C7802 Secondary malignant neoplasm of left lung: Secondary | ICD-10-CM

## 2016-12-21 DIAGNOSIS — C801 Malignant (primary) neoplasm, unspecified: Secondary | ICD-10-CM

## 2016-12-21 LAB — CBC WITH DIFFERENTIAL/PLATELET
BASO%: 0.5 % (ref 0.0–2.0)
Basophils Absolute: 0 10*3/uL (ref 0.0–0.1)
EOS ABS: 0 10*3/uL (ref 0.0–0.5)
EOS%: 0.2 % (ref 0.0–7.0)
HCT: 25.8 % — ABNORMAL LOW (ref 38.4–49.9)
HEMOGLOBIN: 8.2 g/dL — AB (ref 13.0–17.1)
LYMPH%: 4.5 % — ABNORMAL LOW (ref 14.0–49.0)
MCH: 32.3 pg (ref 27.2–33.4)
MCHC: 31.8 g/dL — ABNORMAL LOW (ref 32.0–36.0)
MCV: 101.6 fL — AB (ref 79.3–98.0)
MONO#: 0.4 10*3/uL (ref 0.1–0.9)
MONO%: 4.8 % (ref 0.0–14.0)
NEUT%: 90 % — ABNORMAL HIGH (ref 39.0–75.0)
NEUTROS ABS: 7.5 10*3/uL — AB (ref 1.5–6.5)
Platelets: 131 10*3/uL — ABNORMAL LOW (ref 140–400)
RBC: 2.54 10*6/uL — ABNORMAL LOW (ref 4.20–5.82)
RDW: 25.2 % — AB (ref 11.0–14.6)
WBC: 8.3 10*3/uL (ref 4.0–10.3)
lymph#: 0.4 10*3/uL — ABNORMAL LOW (ref 0.9–3.3)

## 2016-12-21 LAB — COMPREHENSIVE METABOLIC PANEL
ALBUMIN: 3.3 g/dL — AB (ref 3.5–5.0)
ALT: <6 U/L (ref 0–55)
AST: 10 U/L (ref 5–34)
Alkaline Phosphatase: 88 U/L (ref 40–150)
Anion Gap: 7 mEq/L (ref 3–11)
BILIRUBIN TOTAL: 0.47 mg/dL (ref 0.20–1.20)
BUN: 31.3 mg/dL — AB (ref 7.0–26.0)
CO2: 19 mEq/L — ABNORMAL LOW (ref 22–29)
CREATININE: 0.9 mg/dL (ref 0.7–1.3)
Calcium: 9.1 mg/dL (ref 8.4–10.4)
Chloride: 112 mEq/L — ABNORMAL HIGH (ref 98–109)
GLUCOSE: 86 mg/dL (ref 70–140)
Potassium: 4.7 mEq/L (ref 3.5–5.1)
SODIUM: 138 meq/L (ref 136–145)
TOTAL PROTEIN: 7.9 g/dL (ref 6.4–8.3)

## 2016-12-21 LAB — PREPARE RBC (CROSSMATCH)

## 2016-12-21 MED ORDER — DIPHENHYDRAMINE HCL 25 MG PO CAPS
ORAL_CAPSULE | ORAL | Status: AC
  Filled 2016-12-21: qty 1

## 2016-12-21 MED ORDER — SODIUM CHLORIDE 0.9% FLUSH
10.0000 mL | INTRAVENOUS | Status: DC | PRN
  Filled 2016-12-21: qty 10

## 2016-12-21 MED ORDER — PACLITAXEL CHEMO INJECTION 300 MG/50ML
45.0000 mg/m2 | Freq: Once | INTRAVENOUS | Status: AC
  Administered 2016-12-21: 72 mg via INTRAVENOUS
  Filled 2016-12-21: qty 12

## 2016-12-21 MED ORDER — SODIUM CHLORIDE 0.9% FLUSH
10.0000 mL | INTRAVENOUS | Status: AC | PRN
  Administered 2016-12-21: 10 mL
  Filled 2016-12-21: qty 10

## 2016-12-21 MED ORDER — DIPHENHYDRAMINE HCL 50 MG/ML IJ SOLN
50.0000 mg | Freq: Once | INTRAMUSCULAR | Status: AC
  Administered 2016-12-21: 50 mg via INTRAVENOUS

## 2016-12-21 MED ORDER — SODIUM CHLORIDE 0.9 % IV SOLN: 250.0000 mL | Freq: Once | INTRAVENOUS | Status: DC

## 2016-12-21 MED ORDER — FAMOTIDINE IN NACL 20-0.9 MG/50ML-% IV SOLN
INTRAVENOUS | Status: AC
  Filled 2016-12-21: qty 50

## 2016-12-21 MED ORDER — ACETAMINOPHEN 325 MG PO TABS
650.0000 mg | ORAL_TABLET | Freq: Once | ORAL | Status: AC
  Administered 2016-12-21: 650 mg via ORAL

## 2016-12-21 MED ORDER — SODIUM CHLORIDE 0.9% FLUSH
10.0000 mL | Freq: Once | INTRAVENOUS | Status: AC
  Administered 2016-12-21: 10 mL
  Filled 2016-12-21: qty 10

## 2016-12-21 MED ORDER — SODIUM CHLORIDE 0.9 % IV SOLN
20.0000 mg | Freq: Once | INTRAVENOUS | Status: AC
  Administered 2016-12-21: 20 mg via INTRAVENOUS
  Filled 2016-12-21: qty 2

## 2016-12-21 MED ORDER — ACETAMINOPHEN 325 MG PO TABS
ORAL_TABLET | ORAL | Status: AC
  Filled 2016-12-21: qty 2

## 2016-12-21 MED ORDER — DIPHENHYDRAMINE HCL 25 MG PO CAPS
25.0000 mg | ORAL_CAPSULE | Freq: Once | ORAL | Status: AC
  Administered 2016-12-21: 25 mg via ORAL

## 2016-12-21 MED ORDER — FAMOTIDINE IN NACL 20-0.9 MG/50ML-% IV SOLN
20.0000 mg | Freq: Once | INTRAVENOUS | Status: AC
  Administered 2016-12-21: 20 mg via INTRAVENOUS

## 2016-12-21 MED ORDER — PALONOSETRON HCL INJECTION 0.25 MG/5ML
INTRAVENOUS | Status: AC
  Filled 2016-12-21: qty 5

## 2016-12-21 MED ORDER — SODIUM CHLORIDE 0.9 % IV SOLN
152.0000 mg | Freq: Once | INTRAVENOUS | Status: AC
  Administered 2016-12-21: 150 mg via INTRAVENOUS
  Filled 2016-12-21: qty 15

## 2016-12-21 MED ORDER — DIPHENHYDRAMINE HCL 50 MG/ML IJ SOLN
INTRAMUSCULAR | Status: AC
  Filled 2016-12-21: qty 1

## 2016-12-21 MED ORDER — HEPARIN SOD (PORK) LOCK FLUSH 100 UNIT/ML IV SOLN
500.0000 [IU] | Freq: Once | INTRAVENOUS | Status: AC | PRN
  Administered 2016-12-21: 500 [IU]
  Filled 2016-12-21: qty 5

## 2016-12-21 MED ORDER — PALONOSETRON HCL INJECTION 0.25 MG/5ML
0.2500 mg | Freq: Once | INTRAVENOUS | Status: AC
  Administered 2016-12-21: 0.25 mg via INTRAVENOUS

## 2016-12-21 MED ORDER — SODIUM CHLORIDE 0.9 % IV SOLN
Freq: Once | INTRAVENOUS | Status: AC
  Administered 2016-12-21: 11:00:00 via INTRAVENOUS

## 2016-12-21 NOTE — Patient Instructions (Signed)
Implanted Port Home Guide An implanted port is a type of central line that is placed under the skin. Central lines are used to provide IV access when treatment or nutrition needs to be given through a person's veins. Implanted ports are used for long-term IV access. An implanted port may be placed because:  You need IV medicine that would be irritating to the small veins in your hands or arms.  You need long-term IV medicines, such as antibiotics.  You need IV nutrition for a long period.  You need frequent blood draws for lab tests.  You need dialysis.  Implanted ports are usually placed in the chest area, but they can also be placed in the upper arm, the abdomen, or the leg. An implanted port has two main parts:  Reservoir. The reservoir is round and will appear as a small, raised area under your skin. The reservoir is the part where a needle is inserted to give medicines or draw blood.  Catheter. The catheter is a thin, flexible tube that extends from the reservoir. The catheter is placed into a large vein. Medicine that is inserted into the reservoir goes into the catheter and then into the vein.  How will I care for my incision site? Do not get the incision site wet. Bathe or shower as directed by your health care provider. How is my port accessed? Special steps must be taken to access the port:  Before the port is accessed, a numbing cream can be placed on the skin. This helps numb the skin over the port site.  Your health care provider uses a sterile technique to access the port. ? Your health care provider must put on a mask and sterile gloves. ? The skin over your port is cleaned carefully with an antiseptic and allowed to dry. ? The port is gently pinched between sterile gloves, and a needle is inserted into the port.  Only "non-coring" port needles should be used to access the port. Once the port is accessed, a blood return should be checked. This helps ensure that the port  is in the vein and is not clogged.  If your port needs to remain accessed for a constant infusion, a clear (transparent) bandage will be placed over the needle site. The bandage and needle will need to be changed every week, or as directed by your health care provider.  Keep the bandage covering the needle clean and dry. Do not get it wet. Follow your health care provider's instructions on how to take a shower or bath while the port is accessed.  If your port does not need to stay accessed, no bandage is needed over the port.  What is flushing? Flushing helps keep the port from getting clogged. Follow your health care provider's instructions on how and when to flush the port. Ports are usually flushed with saline solution or a medicine called heparin. The need for flushing will depend on how the port is used.  If the port is used for intermittent medicines or blood draws, the port will need to be flushed: ? After medicines have been given. ? After blood has been drawn. ? As part of routine maintenance.  If a constant infusion is running, the port may not need to be flushed.  How long will my port stay implanted? The port can stay in for as long as your health care provider thinks it is needed. When it is time for the port to come out, surgery will be   done to remove it. The procedure is similar to the one performed when the port was put in. When should I seek immediate medical care? When you have an implanted port, you should seek immediate medical care if:  You notice a bad smell coming from the incision site.  You have swelling, redness, or drainage at the incision site.  You have more swelling or pain at the port site or the surrounding area.  You have a fever that is not controlled with medicine.  This information is not intended to replace advice given to you by your health care provider. Make sure you discuss any questions you have with your health care provider. Document  Released: 04/25/2005 Document Revised: 10/01/2015 Document Reviewed: 12/31/2012 Elsevier Interactive Patient Education  2017 [employer]  

## 2016-12-21 NOTE — Patient Instructions (Addendum)
[facility] Discharge Instructions for Patients Receiving Chemotherapy  Today you received the following chemotherapy agents: Taxol and Carboplatin.  To help prevent nausea and vomiting after your treatment, we encourage you to take your nausea medication: Compazine. Take one every 6 hours as needed. If you develop nausea and vomiting that is not controlled by your nausea medication, call the clinic.   BELOW ARE SYMPTOMS THAT SHOULD BE REPORTED IMMEDIATELY:  *FEVER GREATER THAN 100.5 F  *CHILLS WITH OR WITHOUT FEVER  NAUSEA AND VOMITING THAT IS NOT CONTROLLED WITH YOUR NAUSEA MEDICATION  *UNUSUAL SHORTNESS OF BREATH  *UNUSUAL BRUISING OR BLEEDING  TENDERNESS IN MOUTH AND THROAT WITH OR WITHOUT PRESENCE OF ULCERS  *URINARY PROBLEMS  *BOWEL PROBLEMS  UNUSUAL RASH Items with * indicate a potential emergency and should be followed up as soon as possible.  Feel free to call the clinic should you have any questions or concerns. The clinic phone number is [phone].  Please show the [facility] at check-in to the Emergency Department and triage nurse.   Blood Transfusion, Adult A blood transfusion is a procedure in which you receive donated blood, including plasma, platelets, and red blood cells, through an IV tube. You may need a blood transfusion because of illness, surgery, or injury. The blood may come from a donor. You may also be able to donate blood for yourself (autologous blood donation) before a surgery if you know that you might require a blood transfusion. The blood given in a transfusion is made up of different types of cells. You may receive:  Red blood cells. These carry oxygen to the cells in the body.  White blood cells. These help you fight infections.  Platelets. These help your blood to clot.  Plasma. This is the liquid part of your blood and it helps with fluid imbalances.  If you have hemophilia or another clotting disorder, you may  also receive other types of blood products. Tell a health care provider about:  Any allergies you have.  All medicines you are taking, including vitamins, herbs, eye drops, creams, and over-the-counter medicines.  Any problems you or family members have had with anesthetic medicines.  Any blood disorders you have.  Any surgeries you have had.  Any medical conditions you have, including any recent fever or cold symptoms.  Whether you are pregnant or may be pregnant.  Any previous reactions you have had during a blood transfusion. What are the risks? Generally, this is a safe procedure. However, problems may occur, including:  Having an allergic reaction to something in the donated blood. Hives and itching may be symptoms of this type of reaction.  Fever. This may be a reaction to the white blood cells in the transfused blood. Nausea or chest pain may accompany a fever.  Iron overload. This can happen from having many transfusions.  Transfusion-related acute lung injury (TRALI). This is a rare reaction that causes lung damage. The cause is not known.TRALI can occur within hours of a transfusion or several days later.  Sudden (acute) or delayed hemolytic reactions. This happens if your blood does not match the cells in your transfusion. Your body's defense system (immune system) may try to attack the new cells. This complication is rare. The symptoms include fever, chills, nausea, and low back pain or chest pain.  Infection or disease transmission. This is rare.  What happens before the procedure?  You will have a blood test to determine your blood type. This is  necessary to know what kind of blood your body will accept and to match it to the donor blood.  If you are going to have a planned surgery, you may be able to do an autologous blood donation. This may be done in case you need to have a transfusion.  If you have had an allergic reaction to a transfusion in the past, you  may be given medicine to help prevent a reaction. This medicine may be given to you by mouth or through an IV tube.  You will have your temperature, blood pressure, and pulse monitored before the transfusion.  Follow instructions from your health care provider about eating and drinking restrictions.  Ask your health care provider about: ? Changing or stopping your regular medicines. This is especially important if you are taking diabetes medicines or blood thinners. ? Taking medicines such as aspirin and ibuprofen. These medicines can thin your blood. Do not take these medicines before your procedure if your health care provider instructs you not to. What happens during the procedure?  An IV tube will be inserted into one of your veins.  The bag of donated blood will be attached to your IV tube. The blood will then enter through your vein.  Your temperature, blood pressure, and pulse will be monitored regularly during the transfusion. This monitoring is done to detect early signs of a transfusion reaction.  If you have any signs or symptoms of a reaction, your transfusion will be stopped and you may be given medicine.  When the transfusion is complete, your IV tube will be removed.  Pressure may be applied to the IV site for a few minutes.  A bandage (dressing) will be applied. The procedure may vary among health care providers and hospitals. What happens after the procedure?  Your temperature, blood pressure, heart rate, breathing rate, and blood oxygen level will be monitored often.  Your blood may be tested to see how you are responding to the transfusion.  You may be warmed with fluids or blankets to maintain a normal body temperature. Summary  A blood transfusion is a procedure in which you receive donated blood, including plasma, platelets, and red blood cells, through an IV tube.  Your temperature, blood pressure, and pulse will be monitored before, during, and after the  transfusion.  Your blood may be tested after the transfusion to see how your body has responded. This information is not intended to replace advice given to you by your health care provider. Make sure you discuss any questions you have with your health care provider. Document Released: 04/22/2000 Document Revised: 01/21/2016 Document Reviewed: 01/21/2016 Elsevier Interactive Patient Education  [employer].

## 2016-12-21 NOTE — Telephone Encounter (Signed)
Gave pt avs and calendars for appts.

## 2016-12-22 ENCOUNTER — Encounter: Payer: Self-pay | Admitting: Hematology and Oncology

## 2016-12-22 LAB — TYPE AND SCREEN
ABO/RH(D): O POS
Antibody Screen: NEGATIVE
DONOR AG TYPE: NEGATIVE
UNIT DIVISION: 0

## 2016-12-22 LAB — BPAM RBC
Blood Product Expiration Date: 201809112359
ISSUE DATE / TIME: 201808151334
UNIT TYPE AND RH: 5100

## 2016-12-22 NOTE — Assessment & Plan Note (Signed)
Recent PET CT scan showed positive response to treatment I recommend we continue the same dose of chemotherapy, carboplatin and Taxol on days 1 and 8 and rest day 15 He will get Neulasta support after day 8 treatment I plan to see him back in a few weeks for further supportive care and I plan to repeat imaging study again in October

## 2016-12-22 NOTE — Assessment & Plan Note (Signed)
His appetite is improved. He is doing well on low-dose prednisone therapy He has not lost any weight Continue the same

## 2016-12-22 NOTE — Progress Notes (Signed)
[place] OFFICE PROGRESS NOTE  Patient Care Team: [name], MD as PCP - General (Family Medicine) [name], MD as Consulting Physician (Hematology and Oncology)  SUMMARY OF ONCOLOGIC HISTORY: Oncology History   MDS, R-IPSS score of 3, low risk (Hg 8.9, +16 chromosome on BM, 2% blast count)   Squamous cell carcinoma of the epiglottis   Primary site: Larynx - Supraglottis (Left)   Staging method: AJCC 7th Edition   Clinical: Stage I (T1, N0, M0) signed by [name], MD on 04/30/2013 12:49 PM   Pathologic: Stage I (T1, N0, cM0) signed by [name], MD on 04/30/2013 12:49 PM   Summary: Stage I (T1, N0, cM0)       MDS (myelodysplastic syndrome), low grade ([place])   02/01/2007 Bone Marrow Biopsy    BM biopsy was abnormal, overall probable low grade MDS      03/23/2011 - 02/06/2013 Chemotherapy    [name], discontinued due to diagnosis of laryngeal ca      09/06/2013 Bone Marrow Biopsy    Repeat bone marrow aspirate and biopsy confirmed low-grade myelodysplastic syndrome.      09/17/2013 -  Chemotherapy    The patient resumed darbepoetin injections to treat the anemia.       History of head and neck cancer   03/06/2013 Procedure    Biopsy from epiglottic region showed invasive [facility]      04/25/2013 Imaging    Ct scan showed no other involvement in the LN      05/13/2013 - 06/28/2013 Radiation Therapy    He received radiation therapy, 70 gray in 35 fractions to the larynx      06/09/2016 Imaging    CT scan of the chest showed Bilateral spiculated soft tissue pulmonary masses, highly suspicious for multifocal malignancy. Borderline enlarged left-sided mediastinal lymphadenopathy. Background of severe emphysematous changes, chronic cylindrical bronchiectasis and hyperinflation of the lungs. Findings consistent with longstanding COPD. Diffusely thickened interstitial markings. This may represent chronic interstitial lung changes, however  lymphangitic spread of malignancy cannot be excluded. Anterior compression deformities of several of the thoracic vertebral bodies, with heterogeneous appearance of T7 and T9 vertebral body. This may represent degenerative changes, however osseous metastatic disease cannot be excluded.       07/11/2016 PET scan    There are 2 nodules in the left upper lobe and 2 nodules within the right lower lobe which have spiculated margin and exhibit intense radiotracer uptake compatible with multifocal pulmonary metastasis versus multiple synchronous primary pulmonary neoplasms. 2. Emphysema 3. Aortic atherosclerosis and multi vessel coronary artery calcification.       Cancer of upper lobe of left lung ([place])   06/09/2016 Imaging    CT chest: Bilateral spiculated soft tissue pulmonary masses, highly suspicious for multifocal malignancy. Borderline enlarged left-sided mediastinal lymphadenopathy. Background of severe emphysematous changes, chronic cylindrical bronchiectasis and hyperinflation of the lungs. Findings consistent with longstanding COPD. Diffusely thickened interstitial markings. This may represent chronic interstitial lung changes, however lymphangitic spread of malignancy cannot be excluded. Anterior compression deformities of several of the thoracic vertebral bodies, with heterogeneous appearance of T7 and T9 vertebral body. This may represent degenerative changes, however osseous metastatic disease cannot be excluded.       06/29/2016 Imaging    MRI brain: No evidence of metastatic disease or recent infarction. Advanced generalized brain atrophy with moderate to marked chronic small-vessel ischemic changes throughout. FLAIR imaging appears to show scattered gyri showing FLAIR hyperintensity. These abnormalities are not  confirmed with restricted diffusion or contrast enhancement. Therefore, we are not certain that this is not artifactual. The differential diagnosis does include true pathology such  as posterior reversible encephalopathy, viral or prion disease, postictal change in post chemotherapy change. If there is concern about active CNS pathology, re- scanning in 4-6 weeks could be useful.      07/27/2016 Imaging    CT chest: Bilateral pulmonary lesions. These are suspicious for metastatic disease. Lesions have minimally changed but there may be slight enlargement of the cavitary lesion in the right lower lobe. Severe emphysematous changes.       08/10/2016 Pathology Results    1. Lung, biopsy, RUL, (apical segment) - SCANT BENIGN LUNG PARENCHYMA. - THERE IS NO EVIDENCE OF MALIGNANCY. 2. Lung, biopsy, LUL lingula - SQUAMOUS CELL CARCINOMA. - SEE COMMENT. 3. Lung, biopsy, LUL - ADENOCARCINOMA. - SEE COMMENT. Microscopic Comment 2. The malignant cells are positive for cytokeratin 56 and p63. They are negative for TTF-1. The findings are consistent with squamous cell carcinoma. There is likely insufficient tissue remaining for additional studies, if requested. 3. The malignant cells are positive for TTF-1 and negative for p63 and cytokeratin 5/6. The findings are consistent with adenocarcinoma.      08/10/2016 Procedure    The targets were defined as follows: Left upper lobe nodule, target 1 Lingular nodule, target 2 Proximal right lower lobe nodule, target 3 More distal right lower lobe, nodule  The extendable working channel was secured into place and the locator guide was withdrawn. Under fluoroscopic guidance transbronchial needle brushings, transbronchial Wang needle biopsies, and transbronchial forceps biopsies were performed in the LUL (target 1) and the lingula (target 2) to be sent for cytology and pathology. A bronchioalveolar lavage was performed in the lingula in the vicinity of target 2  and sent for cytology and microbiology (bacterial, fungal, AFB smears and cultures).  Fiducial markers were then placed around target 1, target 2, and in the right lower lobe in the  vicinity of both target 3 and target 4 to be used for possible radiation therapy should this be indicated. At the end of the procedure a general airway inspection was performed and there was no evidence of active bleeding. The bronchoscope was removed.  The patient tolerated the procedure well. There was no significant blood loss and there were no obvious complications. A post-procedural chest x-ray is pending.  Samples: 1. Transbronchial needle brushings from targets 1 and 2 2. Transbronchial Wang needle biopsies from targets 1 and 2 3. Transbronchial forceps biopsies from targets 1 and 2 4. Bronchoalveolar lavage from lingula, target 2 5. Endobronchial biopsies from RUL apical segment 6. Endobronchial brushings from right upper lobe apical segment       08/10/2016 Genetic Testing    Patient has genetic testing done for [employer] and PD-L1 testing Results revealed PD-L1 testing is negative 0%      08/31/2016 Procedure    Technically successful right IJ power-injectable port catheter placement. Ready for routine use      11/30/2016 PET scan    1. Interval resection of the hypermetabolic lingular nodule. 2. The 3 other spiculated, hypermetabolic nodules within the left upper and right lower lobes are slightly smaller with decreased metabolic activity, consistent with some response to interval therapy. 3. No progressive disease       INTERVAL HISTORY: Please see below for problem oriented charting. He returns for further follow-up, to be seen prior to chemotherapy He complained of fatigue Denies recent smoking  No recent cough, chest pain or shortness of breath Denies peripheral neuropathy from treatment.  No recent infection.  REVIEW OF SYSTEMS:   Constitutional: Denies fevers, chills or abnormal weight loss Eyes: Denies blurriness of vision Ears, nose, mouth, throat, and face: Denies mucositis or sore throat Respiratory: Denies cough, dyspnea or wheezes Cardiovascular:  Denies palpitation, chest discomfort or lower extremity swelling Gastrointestinal:  Denies nausea, heartburn or change in bowel habits Skin: Denies abnormal skin rashes Lymphatics: Denies new lymphadenopathy or easy bruising Neurological:Denies numbness, tingling or new weaknesses Behavioral/Psych: Mood is stable, no new changes  All other systems were reviewed with the patient and are negative.  I have reviewed the past medical history, past surgical history, social history and family history with the patient and they are unchanged from previous note.  ALLERGIES:  is allergic to no known allergies.  MEDICATIONS:  Current Outpatient Prescriptions  Medication Sig Dispense Refill  . albuterol (PROVENTIL HFA;VENTOLIN HFA) 108 (90 Base) MCG/ACT inhaler Inhale 2 puffs into the lungs every 4 (four) hours as needed for wheezing or shortness of breath. 1 Inhaler 5  . cyanocobalamin 500 MCG tablet Take 500 mcg by mouth daily.    . ferrous sulfate 325 (65 FE) MG tablet Take 325 mg by mouth daily with breakfast.    . folic acid (FOLVITE) 1 MG tablet Take 1 mg by mouth daily.    . Glycopyrrolate-Formoterol (BEVESPI AEROSPHERE) 9-4.8 MCG/ACT AERO Inhale 2 puffs into the lungs 2 (two) times daily. 2 Inhaler 0  . levETIRAcetam (KEPPRA) 500 MG tablet Take 1 tablet (500 mg total) by mouth 2 (two) times daily. 60 tablet 3  . lidocaine-prilocaine (EMLA) cream Apply to affected area once 30 g 3  . ondansetron (ZOFRAN) 8 MG tablet Take 1 tablet (8 mg total) by mouth 2 (two) times daily as needed for refractory nausea / vomiting. Start on day 3 after chemo. 30 tablet 1  . predniSONE (DELTASONE) 5 MG tablet TAKE 1 TABLET (5 MG TOTAL) BY MOUTH DAILY WITH BREAKFAST. 30 tablet 1  . prochlorperazine (COMPAZINE) 10 MG tablet Take 10 mg by mouth every 6 (six) hours as needed.    . vitamin E 1000 UNIT capsule Take 1,000 Units by mouth daily.      No current facility-administered medications for this visit.      PHYSICAL EXAMINATION: ECOG PERFORMANCE STATUS: 1 - Symptomatic but completely ambulatory  Vitals:   12/21/16 0952  BP: (!) 120/57  Pulse: 80  Resp: 18  Temp: 97.7 F (36.5 C)  SpO2: 100%   Filed Weights   12/21/16 0952  Weight: 108 lb 14.4 oz (49.4 kg)    GENERAL:alert, no distress and comfortable.  He looks thin and cachectic SKIN: skin color, texture, turgor are normal, no rashes or significant lesions EYES: normal, Conjunctiva are pink and non-injected, sclera clear OROPHARYNX:no exudate, no erythema and lips, buccal mucosa, and tongue normal  NECK: supple, thyroid normal size, non-tender, without nodularity LYMPH:  no palpable lymphadenopathy in the cervical, axillary or inguinal LUNGS: clear to auscultation and percussion with normal breathing effort HEART: regular rate & rhythm and no murmurs and no lower extremity edema ABDOMEN:abdomen soft, non-tender and normal bowel sounds Musculoskeletal:no cyanosis of digits and no clubbing  NEURO: alert & oriented x 3 with fluent speech, no focal motor/sensory deficits  LABORATORY DATA:  I have reviewed the data as listed    Component Value Date/Time   NA 138 12/21/2016 0913   K 4.7 12/21/2016 0913  CL 108 08/31/2016 1155   CL 107 09/19/2012 1325   CO2 19 (L) 12/21/2016 0913   GLUCOSE 86 12/21/2016 0913   GLUCOSE 97 09/19/2012 1325   BUN 31.3 (H) 12/21/2016 0913   CREATININE 0.9 12/21/2016 0913   CALCIUM 9.1 12/21/2016 0913   PROT 7.9 12/21/2016 0913   ALBUMIN 3.3 (L) 12/21/2016 0913   AST 10 12/21/2016 0913   ALT <6 12/21/2016 0913   ALKPHOS 88 12/21/2016 0913   BILITOT 0.47 12/21/2016 0913   GFRNONAA >60 08/31/2016 1155   GFRAA >60 08/31/2016 1155    No results found for: SPEP, UPEP  Lab Results  Component Value Date   WBC 8.3 12/21/2016   NEUTROABS 7.5 (H) 12/21/2016   HGB 8.2 (L) 12/21/2016   HCT 25.8 (L) 12/21/2016   MCV 101.6 (H) 12/21/2016   PLT 131 (L) 12/21/2016      Chemistry       Component Value Date/Time   NA 138 12/21/2016 0913   K 4.7 12/21/2016 0913   CL 108 08/31/2016 1155   CL 107 09/19/2012 1325   CO2 19 (L) 12/21/2016 0913   BUN 31.3 (H) 12/21/2016 0913   CREATININE 0.9 12/21/2016 0913      Component Value Date/Time   CALCIUM 9.1 12/21/2016 0913   ALKPHOS 88 12/21/2016 0913   AST 10 12/21/2016 0913   ALT <6 12/21/2016 0913   BILITOT 0.47 12/21/2016 0913       RADIOGRAPHIC STUDIES: I have personally reviewed the radiological images as listed and agreed with the findings in the report. Nm Pet Image Restag (ps) Skull Base To Thigh  Result Date: 11/30/2016 CLINICAL DATA:  Subsequent treatment strategy for left lung cancer. EXAM: NUCLEAR MEDICINE PET SKULL BASE TO THIGH TECHNIQUE: 4.6 mCi F-18 FDG was injected intravenously. Full-ring PET imaging was performed from the skull base to thigh after the radiotracer. CT data was obtained and used for attenuation correction and anatomic localization. FASTING BLOOD GLUCOSE:  Value: 81 Mg/dl COMPARISON:  PET-CT 07/11/2016.  Chest CT 09-07-202018. FINDINGS: NECK No hypermetabolic cervical lymph nodes are identified.There are no lesions of the pharyngeal mucosal space. There is prominent symmetric parotid activity bilaterally. CHEST There are no hypermetabolic mediastinal, hilar or axillary lymph nodes. The previously demonstrated spiculated, hypermetabolic nodule within the lingula has been resected. Spiculated left upper lobe lesion is slightly smaller, measuring 2.2 x 1.9 cm on image 27 (previously up to 2.5 cm). This remains hypermetabolic with an SUV max of 9.0 (previously 11.9). The 2 adjacent, spiculated hypermetabolic right lower lobe nodules have changed little in size or appearance. The more superior 1 measures 1.9 x 2.1 cm on image 46 (SUV max 9.8). The more inferior 1 measures 1.7 x 2.1 cm on image 50 (SUV max 7.1). No new or other suspicious pulmonary nodule identified. There is advanced centrilobular emphysema  with right upper lobe scarring. Port-A-Cath remains in place. There is mild atherosclerosis. ABDOMEN/PELVIS There is no hypermetabolic activity within the liver, adrenal glands, spleen or pancreas. There is no hypermetabolic nodal activity. Aortic and branch vessel atherosclerosis noted. SKELETON There is no focal hypermetabolic activity to suggest metastatic disease. Generalized increased marrow activity is noted, likely response to therapy. IMPRESSION: 1. Interval resection of the hypermetabolic lingular nodule. 2. The 3 other spiculated, hypermetabolic nodules within the left upper and right lower lobes are slightly smaller with decreased metabolic activity, consistent with some response to interval therapy. 3. No progressive disease. Electronically Signed   By: Caryl Comes.D.  On: 11/30/2016 08:40    ASSESSMENT & PLAN:  Cancer of upper lobe of left lung ([facility]) Recent PET CT scan showed positive response to treatment I recommend we continue the same dose of chemotherapy, carboplatin and Taxol on days 1 and 8 and rest day 15 He will get Neulasta support after day 8 treatment I plan to see him back in a few weeks for further supportive care and I plan to repeat imaging study again in October   MDS (myelodysplastic syndrome), low grade The anemia from MDS is stable. I suspect he may have some degree of bone marrow suppression from his antiseizure medications. He had responded well to Darbopeitin in the past with improvement in energy level.  The patient will get regular blood work done. The patient will continue on Aranep injection to keep hemoglobin greater than 10 g. He will continue to get darbepoetin injection while on treatment, scheduled to be given every 2 weeks. He had recently received blood transfusion with symptomatic improvement.  He will continue to receive blood if hemoglobin is less than 8 Today, he is mildly symptomatic. We discussed some of the risks, benefits, and  alternatives of blood transfusions. The patient is symptomatic from anemia and the hemoglobin level is critically low.  Some of the side-effects to be expected including risks of transfusion reactions, chills, infection, syndrome of volume overload and risk of hospitalization from various reasons and the patient is willing to proceed and went ahead to sign consent today. I plan to give him a unit of blood after chemo today  Malignant cachexia ([place]) His appetite is improved. He is doing well on low-dose prednisone therapy He has not lost any weight Continue the same   No orders of the defined types were placed in this encounter.  All questions were answered. The patient knows to call the clinic with any problems, questions or concerns. No barriers to learning was detected. I spent 15 minutes counseling the patient face to face. The total time spent in the appointment was 20 minutes and more than 50% was on counseling and review of test results     [name], MD 12/22/2016 1:43 PM

## 2016-12-22 NOTE — Assessment & Plan Note (Signed)
The anemia from MDS is stable. I suspect he may have some degree of bone marrow suppression from his antiseizure medications. He had responded well to Darbopeitin in the past with improvement in energy level.  The patient will get regular blood work done. The patient will continue on Aranep injection to keep hemoglobin greater than 10 g. He will continue to get darbepoetin injection while on treatment, scheduled to be given every 2 weeks. He had recently received blood transfusion with symptomatic improvement.  He will continue to receive blood if hemoglobin is less than 8 Today, he is mildly symptomatic. We discussed some of the risks, benefits, and alternatives of blood transfusions. The patient is symptomatic from anemia and the hemoglobin level is critically low.  Some of the side-effects to be expected including risks of transfusion reactions, chills, infection, syndrome of volume overload and risk of hospitalization from various reasons and the patient is willing to proceed and went ahead to sign consent today. I plan to give him a unit of blood after chemo today

## 2016-12-23 ENCOUNTER — Ambulatory Visit (HOSPITAL_BASED_OUTPATIENT_CLINIC_OR_DEPARTMENT_OTHER): Payer: Medicare Other

## 2016-12-23 DIAGNOSIS — C7802 Secondary malignant neoplasm of left lung: Secondary | ICD-10-CM

## 2016-12-23 DIAGNOSIS — C3412 Malignant neoplasm of upper lobe, left bronchus or lung: Secondary | ICD-10-CM

## 2016-12-23 DIAGNOSIS — Z5189 Encounter for other specified aftercare: Secondary | ICD-10-CM

## 2016-12-23 DIAGNOSIS — C801 Malignant (primary) neoplasm, unspecified: Secondary | ICD-10-CM

## 2016-12-23 MED ORDER — PEGFILGRASTIM INJECTION 6 MG/0.6ML ~~LOC~~
6.0000 mg | PREFILLED_SYRINGE | Freq: Once | SUBCUTANEOUS | Status: AC
  Administered 2016-12-23: 6 mg via SUBCUTANEOUS
  Filled 2016-12-23: qty 0.6

## 2016-12-23 NOTE — Patient Instructions (Signed)
Pegfilgrastim injection What is this medicine? PEGFILGRASTIM (PEG fil gra stim) is a long-acting granulocyte colony-stimulating factor that stimulates the growth of neutrophils, a type of white blood cell important in the body's fight against infection. It is used to reduce the incidence of fever and infection in patients with certain types of cancer who are receiving chemotherapy that affects the bone marrow, and to increase survival after being exposed to high doses of radiation. This medicine may be used for other purposes; ask your health care provider or pharmacist if you have questions. COMMON BRAND NAME(S): Neulasta What should I tell my health care provider before I take this medicine? They need to know if you have any of these conditions: -kidney disease -latex allergy -ongoing radiation therapy -sickle cell disease -skin reactions to acrylic adhesives (On-Body Injector only) -an unusual or allergic reaction to pegfilgrastim, filgrastim, other medicines, foods, dyes, or preservatives -pregnant or trying to get pregnant -breast-feeding How should I use this medicine? This medicine is for injection under the skin. If you get this medicine at home, you will be taught how to prepare and give the pre-filled syringe or how to use the On-body Injector. Refer to the patient Instructions for Use for detailed instructions. Use exactly as directed. Tell your healthcare provider immediately if you suspect that the On-body Injector may not have performed as intended or if you suspect the use of the On-body Injector resulted in a missed or partial dose. It is important that you put your used needles and syringes in a special sharps container. Do not put them in a trash can. If you do not have a sharps container, call your pharmacist or healthcare provider to get one. Talk to your pediatrician regarding the use of this medicine in children. While this drug may be prescribed for selected conditions,  precautions do apply. Overdosage: If you think you have taken too much of this medicine contact a poison control center or emergency room at once. NOTE: This medicine is only for you. Do not share this medicine with others. What if I miss a dose? It is important not to miss your dose. Call your doctor or health care professional if you miss your dose. If you miss a dose due to an On-body Injector failure or leakage, a new dose should be administered as soon as possible using a single prefilled syringe for manual use. What may interact with this medicine? Interactions have not been studied. Give your health care provider a list of all the medicines, herbs, non-prescription drugs, or dietary supplements you use. Also tell them if you smoke, drink alcohol, or use illegal drugs. Some items may interact with your medicine. This list may not describe all possible interactions. Give your health care provider a list of all the medicines, herbs, non-prescription drugs, or dietary supplements you use. Also tell them if you smoke, drink alcohol, or use illegal drugs. Some items may interact with your medicine. What should I watch for while using this medicine? You may need blood work done while you are taking this medicine. If you are going to need a MRI, CT scan, or other procedure, tell your doctor that you are using this medicine (On-Body Injector only). What side effects may I notice from receiving this medicine? Side effects that you should report to your doctor or health care professional as soon as possible: -allergic reactions like skin rash, itching or hives, swelling of the face, lips, or tongue -dizziness -fever -pain, redness, or irritation at site   where injected -pinpoint red spots on the skin -red or dark-brown urine -shortness of breath or breathing problems -stomach or side pain, or pain at the shoulder -swelling -tiredness -trouble passing urine or change in the amount of urine Side  effects that usually do not require medical attention (report to your doctor or health care professional if they continue or are bothersome): -bone pain -muscle pain This list may not describe all possible side effects. Call your doctor for medical advice about side effects. You may report side effects to FDA at 1-800-FDA-1088. Where should I keep my medicine? Keep out of the reach of children. Store pre-filled syringes in a refrigerator between 2 and 8 degrees C (36 and 46 degrees F). Do not freeze. Keep in carton to protect from light. Throw away this medicine if it is left out of the refrigerator for more than 48 hours. Throw away any unused medicine after the expiration date. NOTE: This sheet is a summary. It may not cover all possible information. If you have questions about this medicine, talk to your doctor, pharmacist, or health care provider.  2018 Elsevier/Gold Standard (2016-04-21 12:58:03)  

## 2017-01-04 ENCOUNTER — Ambulatory Visit (INDEPENDENT_AMBULATORY_CARE_PROVIDER_SITE_OTHER): Payer: Medicare Other | Admitting: Neurology

## 2017-01-04 ENCOUNTER — Encounter (INDEPENDENT_AMBULATORY_CARE_PROVIDER_SITE_OTHER): Payer: Self-pay

## 2017-01-04 ENCOUNTER — Ambulatory Visit (HOSPITAL_BASED_OUTPATIENT_CLINIC_OR_DEPARTMENT_OTHER): Payer: Medicare Other

## 2017-01-04 ENCOUNTER — Other Ambulatory Visit (HOSPITAL_BASED_OUTPATIENT_CLINIC_OR_DEPARTMENT_OTHER): Payer: Medicare Other

## 2017-01-04 ENCOUNTER — Encounter: Payer: Self-pay | Admitting: Neurology

## 2017-01-04 VITALS — BP 121/69 | HR 80 | Temp 98.0°F | Resp 17

## 2017-01-04 VITALS — BP 102/59 | HR 76 | Wt 109.4 lb

## 2017-01-04 DIAGNOSIS — C3412 Malignant neoplasm of upper lobe, left bronchus or lung: Secondary | ICD-10-CM | POA: Diagnosis not present

## 2017-01-04 DIAGNOSIS — C7802 Secondary malignant neoplasm of left lung: Secondary | ICD-10-CM

## 2017-01-04 DIAGNOSIS — C801 Malignant (primary) neoplasm, unspecified: Secondary | ICD-10-CM

## 2017-01-04 DIAGNOSIS — G40909 Epilepsy, unspecified, not intractable, without status epilepticus: Secondary | ICD-10-CM | POA: Diagnosis not present

## 2017-01-04 DIAGNOSIS — D4620 Refractory anemia with excess of blasts, unspecified: Secondary | ICD-10-CM

## 2017-01-04 DIAGNOSIS — Z5111 Encounter for antineoplastic chemotherapy: Secondary | ICD-10-CM | POA: Diagnosis not present

## 2017-01-04 LAB — COMPREHENSIVE METABOLIC PANEL
ALBUMIN: 3.2 g/dL — AB (ref 3.5–5.0)
ALT: <6 U/L (ref 0–55)
AST: 11 U/L (ref 5–34)
Alkaline Phosphatase: 118 U/L (ref 40–150)
Anion Gap: 6 mEq/L (ref 3–11)
BUN: 22.8 mg/dL (ref 7.0–26.0)
CO2: 21 mEq/L — ABNORMAL LOW (ref 22–29)
CREATININE: 0.9 mg/dL (ref 0.7–1.3)
Calcium: 9.1 mg/dL (ref 8.4–10.4)
Chloride: 112 mEq/L — ABNORMAL HIGH (ref 98–109)
EGFR: >90 mL/min/{1.73_m2} (ref >90)
GLUCOSE: 89 mg/dL (ref 70–140)
Potassium: 5.2 mEq/L — ABNORMAL HIGH (ref 3.5–5.1)
SODIUM: 139 meq/L (ref 136–145)
TOTAL PROTEIN: 8 g/dL (ref 6.4–8.3)
Total Bilirubin: 0.38 mg/dL (ref 0.20–1.20)

## 2017-01-04 LAB — CBC WITH DIFFERENTIAL/PLATELET
BASO%: 0.1 % (ref 0.0–2.0)
Basophils Absolute: 0 10*3/uL (ref 0.0–0.1)
EOS%: 0 % (ref 0.0–7.0)
Eosinophils Absolute: 0 10*3/uL (ref 0.0–0.5)
HCT: 27.4 % — ABNORMAL LOW (ref 38.4–49.9)
HEMOGLOBIN: 8.8 g/dL — AB (ref 13.0–17.1)
LYMPH%: 1.9 % — ABNORMAL LOW (ref 14.0–49.0)
MCH: 32.2 pg (ref 27.2–33.4)
MCHC: 32.1 g/dL (ref 32.0–36.0)
MCV: 100.4 fL — ABNORMAL HIGH (ref 79.3–98.0)
MONO#: 1.4 10*3/uL — ABNORMAL HIGH (ref 0.1–0.9)
MONO%: 4.4 % (ref 0.0–14.0)
NEUT%: 93.6 % — ABNORMAL HIGH (ref 39.0–75.0)
NEUTROS ABS: 29.2 10*3/uL — AB (ref 1.5–6.5)
Platelets: 201 10*3/uL (ref 140–400)
RBC: 2.73 10*6/uL — ABNORMAL LOW (ref 4.20–5.82)
RDW: 25.7 % — AB (ref 11.0–14.6)
WBC: 31.3 10*3/uL — AB (ref 4.0–10.3)
lymph#: 0.6 10*3/uL — ABNORMAL LOW (ref 0.9–3.3)

## 2017-01-04 MED ORDER — DIPHENHYDRAMINE HCL 50 MG/ML IJ SOLN
INTRAMUSCULAR | Status: AC
  Filled 2017-01-04: qty 1

## 2017-01-04 MED ORDER — SODIUM CHLORIDE 0.9 % IV SOLN
20.0000 mg | Freq: Once | INTRAVENOUS | Status: AC
  Administered 2017-01-04: 20 mg via INTRAVENOUS
  Filled 2017-01-04: qty 2

## 2017-01-04 MED ORDER — PALONOSETRON HCL INJECTION 0.25 MG/5ML
0.2500 mg | Freq: Once | INTRAVENOUS | Status: AC
  Administered 2017-01-04: 0.25 mg via INTRAVENOUS

## 2017-01-04 MED ORDER — LEVETIRACETAM 500 MG PO TABS: 500.0000 mg | ORAL_TABLET | Freq: Two times a day (BID) | ORAL | 3 refills | Status: DC

## 2017-01-04 MED ORDER — PACLITAXEL CHEMO INJECTION 300 MG/50ML
45.0000 mg/m2 | Freq: Once | INTRAVENOUS | Status: AC
  Administered 2017-01-04: 72 mg via INTRAVENOUS
  Filled 2017-01-04: qty 12

## 2017-01-04 MED ORDER — FAMOTIDINE IN NACL 20-0.9 MG/50ML-% IV SOLN
INTRAVENOUS | Status: AC
  Filled 2017-01-04: qty 50

## 2017-01-04 MED ORDER — CARBOPLATIN CHEMO INJECTION 450 MG/45ML
152.0000 mg | Freq: Once | INTRAVENOUS | Status: AC
  Administered 2017-01-04: 150 mg via INTRAVENOUS
  Filled 2017-01-04: qty 15

## 2017-01-04 MED ORDER — SODIUM CHLORIDE 0.9 % IV SOLN
Freq: Once | INTRAVENOUS | Status: AC
  Administered 2017-01-04: 13:00:00 via INTRAVENOUS

## 2017-01-04 MED ORDER — FAMOTIDINE IN NACL 20-0.9 MG/50ML-% IV SOLN
20.0000 mg | Freq: Once | INTRAVENOUS | Status: AC
  Administered 2017-01-04: 20 mg via INTRAVENOUS

## 2017-01-04 MED ORDER — DIPHENHYDRAMINE HCL 50 MG/ML IJ SOLN
50.0000 mg | Freq: Once | INTRAMUSCULAR | Status: AC
  Administered 2017-01-04: 50 mg via INTRAVENOUS

## 2017-01-04 MED ORDER — PALONOSETRON HCL INJECTION 0.25 MG/5ML
INTRAVENOUS | Status: AC
  Filled 2017-01-04: qty 5

## 2017-01-04 MED ORDER — SODIUM CHLORIDE 0.9% FLUSH
10.0000 mL | INTRAVENOUS | Status: DC | PRN
  Administered 2017-01-04: 10 mL
  Filled 2017-01-04: qty 10

## 2017-01-04 MED ORDER — HEPARIN SOD (PORK) LOCK FLUSH 100 UNIT/ML IV SOLN
500.0000 [IU] | Freq: Once | INTRAVENOUS | Status: AC | PRN
  Administered 2017-01-04: 500 [IU]
  Filled 2017-01-04: qty 5

## 2017-01-04 NOTE — Patient Instructions (Signed)
I had a long discussion the patient and his wife with regards to his remote seizures which appear quite stable and has been seizure-free now for 6 years. Continue Keppra 500 mg twice daily is tolerating it well without side effects. He was given a refill to last a year. His mild cognitive impairment also appears to be stable. I again encouraged him to increase participation in cognitively challenging activities like solving crossword puzzles, playing bridge and sudoku. He was advised to continue his chemotherapy for lung cancer as per his oncologist. He will return for follow-up in a year or call earlier if necessary.

## 2017-01-04 NOTE — Progress Notes (Signed)
[facility] [address] [place]. [place] [address] [phone]       OFFICE FOLLOW UP VISIT NOTE  [name] Date of Birth:  [birth year] Medical Record Number:  [id number]   Referring MD:  Adrian [name]  Reason for Referral:  seizure  HPI: 66 year male with history of [name] since 30 years which were apparently called alcohol related either due to intoxication or withdrawal. Adrian has been on Dilantin since a long time and Adrian could tell of previous stroke. Adrian has myelodysplastic syndrome with anemia. It is suspected that chronic Dilantin usage may have contributed to his bone marrow suppression fan primary care physician has raised the question as to whether Adrian can come off anticonvulsants since Adrian has been seizure-free for a long time. Adrian denies history of head injury, loss of consciousness, febrile illness for unprovoked [name]. Adrian does not remember any unprovoked [name] which was nonl alcohol related. There is no family history of [name] or epilepsy. UPDATE 03/05/2014 : Adrian returns for followup of her last visit 4 months ago. Adrian [name]. Adrian continues to have a mild short term memory difficulties which appear to be unchanged. Adrian still independent in most activities of daily living except Adrian does not drive. Adrian has not had any new neurological complaints. Adrian underwent lab work on 12/25/13 which showed vitamin B12, TSH to be normal and RPR to be nonreactive. MRI scan of the brain personally reviewed by me done on 01/30/14 shows moderate changes of generalized cerebral atrophy and mild changes of chronic microvascular ischemia and paranasal sinusitis. EEG done on 12/25/13 personally reviewed by me is normal without any epileptiform features . Update 07/09/2014 ; Adrian returns for follow-up after last visit 2 months ago. Adrian is accompanied by his wife and stated that Adrian had a generalized tonic-clonic seizure 2  weeks ago when Adrian was on a tapering dose of Dilantin of 100 mg daily. Adrian was seen in the ER where Adrian was loaded with Dilantin but subsequently Adrian [name] changed him to Adrian [name] 500 twice daily as the plan was to discontinue Dilantin since hematologist felt that it was contributing to his myelodysplasia. Patient has noted some irritable mood and has not felt as alert and wife feels this may be effect of Keppra. However Adrian has been noted for less than 2 weeks. Adrian feels his memory difficulties are unchanged. Adrian scored 23/30 on the Mini-Mental today. Update 01/07/2015 : Adrian returns for follow-up after last visit 6 months ago accompanied by his wife. Adrian is doing well without recurrent [name] now. Adrian is tolerating Keppra better and his irritability and mood changes at the beginning have now settled down. Adrian has had no breakthrough [name]. Adrian had EEG done on 07/16/2014 which I personally reviewed and was normal. Adrian states his memory and come to difficulties remain unchanged. Adrian is independent in activities of daily living and does not need any help at home. Adrian has no new complaints. Adrian is not part sparing in any cognitively challenging activities. Update 01/07/2016 : Adrian returns for follow-up after last visit a year ago. Adrian continues to do well and has been seizure-free now since 2008. Adrian starting Keppra well without dizziness or any side effects. Adrian has had no new interval health problems. Continues to have mild short-term memory difficulties which are unchanged. Adrian still independent in activities of daily living. Adrian is not part sparing in regular activities  for mentally challenging tasks like playing bridge crossword puzzles. Adrian has no new complaints today. Update 01/04/2017 : Adrian returns for follow-up after last visit a year ago. Adrian is accompanied by his wife. Adrian has not had any breakthrough [name] since 2008. Adrian remains on Keppra 500 mg twice daily which is tolerating it well without any side effects. Adrian continues to  have mild short-term memory difficulties which appear to be unchanged. Adrian still independent in activities of daily living. Adrian unfortunately has been diagnosed with lung cancer in March of this year and is currently on chemotherapy which Adrian seems to be tolerating all right so far. ROS:   14 system review of systems is positive for memory loss , cough, shortness of breath seizure,   and all of the systems negative  PMH:  Past Medical History:  Diagnosis Date  . Anxiety   . Arthritis    rheumatoid  . Cancer ([place]) 03/11/2013   larnyx/epiglottic Squamous cell in situ  . Emphysema of lung ([place])   . Esophageal reflux    not current  . History of blood transfusion   . History of radiation therapy 05/13/2013-06/28/2013   68 gray to epiglottis/neck  . Megaloblastic anemia   . Pneumonia 01/2013  . [name] ([place])    last one was 8 years ago- 2008  . Shortness of breath    with exertion  . Throat pain 06/13/2013    Social History:  Social History   Social History  . Marital status: Married    Spouse name: Adrian [name]  . Number of children: 3  . Years of education: 12th   Occupational History  . Retired Other   Social History Main Topics  . Smoking status: Former Smoker    Packs/day: 0.12    Years: 40.00    Types: Cigarettes    Quit date: 10/04/2016  . Smokeless tobacco: Never Used     Comment: smoke 1 to 2 a day  . Alcohol use No  . Drug use: No  . Sexual activity: Not on file   Other Topics Concern  . Not on file   Social History Narrative   04/12/2013   The patient is married with 3 children. (2 girls and one boy)    The patient has a history of smoking a quarter pack per day for 15 years.   The patient is trying to quit on his own.   Patient has not had any alcohol for the past 6 months by report. Patient usually drinks vodka.   Caffeine Use: 2 cups daily             Medications:   Current Outpatient Prescriptions on File Prior to Visit  Medication Sig Dispense Refill  .  albuterol (PROVENTIL HFA;VENTOLIN HFA) 108 (90 Base) MCG/ACT inhaler Inhale 2 puffs into the lungs every 4 (four) hours as needed for wheezing or shortness of breath. 1 Inhaler 5  . cyanocobalamin 500 MCG tablet Take 500 mcg by mouth daily.    . ferrous sulfate 325 (65 FE) MG tablet Take 325 mg by mouth daily with breakfast.    . folic acid (FOLVITE) 1 MG tablet Take 1 mg by mouth daily.    . Glycopyrrolate-Formoterol (BEVESPI AEROSPHERE) 9-4.8 MCG/ACT AERO Inhale 2 puffs into the lungs 2 (two) times daily. 2 Inhaler 0  . lidocaine-prilocaine (EMLA) cream Apply to affected area once 30 g 3  . ondansetron (ZOFRAN) 8 MG tablet Take 1 tablet (8 mg total) by mouth 2 (  two) times daily as needed for refractory nausea / vomiting. Start on day 3 after chemo. 30 tablet 1  . predniSONE (DELTASONE) 5 MG tablet TAKE 1 TABLET (5 MG TOTAL) BY MOUTH DAILY WITH BREAKFAST. 30 tablet 1  . prochlorperazine (COMPAZINE) 10 MG tablet Take 10 mg by mouth every 6 (six) hours as needed.    . vitamin E 1000 UNIT capsule Take 1,000 Units by mouth daily.      No current facility-administered medications on file prior to visit.     Allergies:   Allergies  Allergen Reactions  . No Known Allergies     Physical Exam General: Frail middle-age African-American male, seated, in no evident distress Head: head normocephalic and atraumatic.     Neck: supple with no carotid or supraclavicular bruits Cardiovascular: regular rate and rhythm, no murmurs Musculoskeletal: no deformity Skin:  no rash/petichiae. Chronic fungus infection left hand fingernails with dystrophic nails. Mild clubbing of the nail with conjunctival pallor Vascular:  Normal pulses all extremities Vitals:   01/04/17 1118  BP: (!) 102/59  Pulse: 76    Neurologic Exam Mental Status: Awake and fully alert. Oriented to place and time. Recent and remote memory intact. Attention span, concentration and fund of knowledge appropriate. Mood and affect  appropriate. Mini-Mental status exam not done today.Recall diminished 1/3. Animal naming 8 only.   Cranial Nerves: Fundoscopic exam not done . Pupils equal, briskly reactive to light. Extraocular movements full without nystagmus. Visual fields full to confrontation. Hearing intact. Facial sensation intact. Face, tongue, palate moves normally and symmetrically.  Motor: Normal bulk and tone. Normal strength in all tested extremity muscles. Sensory.: intact to tough and pinprick and vibratory.  Coordination: Rapid alternating movements normal in all extremities. Finger-to-nose and heel-to-shin performed accurately bilaterally. Gait and Station: Arises from chair without difficulty. Stance is normal. Gait demonstrates normal stride length and balance . Able to heel, toe and tandem walk with mild  difficulty.  Reflexes: 1+ and symmetric. Toes downgoing.    ASSESSMENT: 7 year remote history of [name] since 30 years has been seizure-free since 2008. Most of the [name] appear to have been alcohol related.Adrian also has a mild cognitive impairment which appears stable. Recent diagnosis of lung cancer and now started chemotherapy    PLAN: I had a long discussion the patient and his wife with regards to his remote [name] which appear quite stable and has been seizure-free now for 6 years. Continue Keppra 500 mg twice daily is tolerating it well without side effects. Adrian was given a refill to last a year. His mild cognitive impairment also appears to be stable. I again encouraged him to increase participation in cognitively challenging activities like solving crossword puzzles, playing bridge and sudoku. Adrian was advised to continue his chemotherapy for lung cancer as per his oncologist. Adrian will return for follow-up in a year or call earlier if necessary.Antony Contras, MD Note: This document was prepared with digital dictation and possible smart phrase technology. Any transcriptional errors that result  from this process are unintentional.

## 2017-01-04 NOTE — Progress Notes (Signed)
Reviewed pt labs with Dr. Alvy Bimler and ok to treat with Potassium 5.2  Pt aware to take laxatives for the next few days to help bring potassium down. Pt verbalized understanding and had no further questions.

## 2017-01-04 NOTE — Patient Instructions (Signed)
[facility] Discharge Instructions for Patients Receiving Chemotherapy  Today you received the following chemotherapy agents: Taxol and Carboplatin.  To help prevent nausea and vomiting after your treatment, we encourage you to take your nausea medication: Compazine. Take one every 6 hours as needed. If you develop nausea and vomiting that is not controlled by your nausea medication, call the clinic.   BELOW ARE SYMPTOMS THAT SHOULD BE REPORTED IMMEDIATELY:  *FEVER GREATER THAN 100.5 F  *CHILLS WITH OR WITHOUT FEVER  NAUSEA AND VOMITING THAT IS NOT CONTROLLED WITH YOUR NAUSEA MEDICATION  *UNUSUAL SHORTNESS OF BREATH  *UNUSUAL BRUISING OR BLEEDING  TENDERNESS IN MOUTH AND THROAT WITH OR WITHOUT PRESENCE OF ULCERS  *URINARY PROBLEMS  *BOWEL PROBLEMS  UNUSUAL RASH Items with * indicate a potential emergency and should be followed up as soon as possible.  Feel free to call the clinic should you have any questions or concerns. The clinic phone number is [phone].  Please show the [facility] at check-in to the Emergency Department and triage nurse.

## 2017-01-06 ENCOUNTER — Ambulatory Visit: Payer: BC Managed Care – PPO | Admitting: Neurology

## 2017-01-11 ENCOUNTER — Ambulatory Visit (HOSPITAL_BASED_OUTPATIENT_CLINIC_OR_DEPARTMENT_OTHER): Payer: Medicare Other

## 2017-01-11 ENCOUNTER — Ambulatory Visit (HOSPITAL_BASED_OUTPATIENT_CLINIC_OR_DEPARTMENT_OTHER): Payer: Medicare Other | Admitting: Hematology and Oncology

## 2017-01-11 ENCOUNTER — Ambulatory Visit (HOSPITAL_COMMUNITY)
Admission: RE | Admit: 2017-01-11 | Discharge: 2017-01-11 | Disposition: A | Discharge status: Home / Self Care | Payer: Medicare Other | Source: Ambulatory Visit | Attending: Hematology and Oncology | Admitting: Hematology and Oncology

## 2017-01-11 ENCOUNTER — Other Ambulatory Visit (HOSPITAL_BASED_OUTPATIENT_CLINIC_OR_DEPARTMENT_OTHER): Payer: Medicare Other

## 2017-01-11 ENCOUNTER — Ambulatory Visit: Payer: Medicare Other | Admitting: Nutrition

## 2017-01-11 ENCOUNTER — Ambulatory Visit: Payer: Medicare Other

## 2017-01-11 VITALS — BP 142/67 | HR 67 | Temp 97.6°F | Resp 18

## 2017-01-11 VITALS — BP 116/52 | HR 78 | Temp 97.8°F | Resp 18 | Ht 72.0 in | Wt 109.3 lb

## 2017-01-11 DIAGNOSIS — D696 Thrombocytopenia, unspecified: Secondary | ICD-10-CM | POA: Diagnosis not present

## 2017-01-11 DIAGNOSIS — D4620 Refractory anemia with excess of blasts, unspecified: Secondary | ICD-10-CM

## 2017-01-11 DIAGNOSIS — Z5111 Encounter for antineoplastic chemotherapy: Secondary | ICD-10-CM

## 2017-01-11 DIAGNOSIS — R64 Cachexia: Secondary | ICD-10-CM | POA: Diagnosis not present

## 2017-01-11 DIAGNOSIS — C3412 Malignant neoplasm of upper lobe, left bronchus or lung: Secondary | ICD-10-CM

## 2017-01-11 DIAGNOSIS — C7802 Secondary malignant neoplasm of left lung: Secondary | ICD-10-CM

## 2017-01-11 DIAGNOSIS — C801 Malignant (primary) neoplasm, unspecified: Secondary | ICD-10-CM

## 2017-01-11 DIAGNOSIS — Z8589 Personal history of malignant neoplasm of other organs and systems: Secondary | ICD-10-CM

## 2017-01-11 LAB — COMPREHENSIVE METABOLIC PANEL
ALBUMIN: 3.3 g/dL — AB (ref 3.5–5.0)
ALK PHOS: 88 U/L (ref 40–150)
ANION GAP: 7 meq/L (ref 3–11)
AST: 11 U/L (ref 5–34)
BUN: 30.8 mg/dL — ABNORMAL HIGH (ref 7.0–26.0)
CO2: 19 mEq/L — ABNORMAL LOW (ref 22–29)
Calcium: 9.1 mg/dL (ref 8.4–10.4)
Chloride: 112 mEq/L — ABNORMAL HIGH (ref 98–109)
Creatinine: 0.9 mg/dL (ref 0.7–1.3)
GLUCOSE: 88 mg/dL (ref 70–140)
Potassium: 5 mEq/L (ref 3.5–5.1)
SODIUM: 138 meq/L (ref 136–145)
TOTAL PROTEIN: 8 g/dL (ref 6.4–8.3)
Total Bilirubin: 0.37 mg/dL (ref 0.20–1.20)

## 2017-01-11 LAB — CBC WITH DIFFERENTIAL/PLATELET
BASO%: 0.5 % (ref 0.0–2.0)
Basophils Absolute: 0 10*3/uL (ref 0.0–0.1)
EOS ABS: 0 10*3/uL (ref 0.0–0.5)
EOS%: 0.3 % (ref 0.0–7.0)
HCT: 24.1 % — ABNORMAL LOW (ref 38.4–49.9)
HEMOGLOBIN: 7.6 g/dL — AB (ref 13.0–17.1)
LYMPH#: 0.8 10*3/uL — AB (ref 0.9–3.3)
LYMPH%: 9.2 % — ABNORMAL LOW (ref 14.0–49.0)
MCH: 31.3 pg (ref 27.2–33.4)
MCHC: 31.5 g/dL — ABNORMAL LOW (ref 32.0–36.0)
MCV: 99.2 fL — AB (ref 79.3–98.0)
MONO#: 0.5 10*3/uL (ref 0.1–0.9)
MONO%: 5.3 % (ref 0.0–14.0)
NEUT%: 84.7 % — ABNORMAL HIGH (ref 39.0–75.0)
NEUTROS ABS: 7.4 10*3/uL — AB (ref 1.5–6.5)
PLATELETS: 136 10*3/uL — AB (ref 140–400)
RBC: 2.43 10*6/uL — ABNORMAL LOW (ref 4.20–5.82)
RDW: 25.8 % — AB (ref 11.0–14.6)
WBC: 8.7 10*3/uL (ref 4.0–10.3)

## 2017-01-11 LAB — PREPARE RBC (CROSSMATCH)

## 2017-01-11 MED ORDER — PALONOSETRON HCL INJECTION 0.25 MG/5ML
INTRAVENOUS | Status: AC
  Filled 2017-01-11: qty 5

## 2017-01-11 MED ORDER — SODIUM CHLORIDE 0.9 % IV SOLN
152.0000 mg | Freq: Once | INTRAVENOUS | Status: AC
  Administered 2017-01-11: 150 mg via INTRAVENOUS
  Filled 2017-01-11: qty 15

## 2017-01-11 MED ORDER — ACETAMINOPHEN 325 MG PO TABS
ORAL_TABLET | ORAL | Status: AC
  Filled 2017-01-11: qty 2

## 2017-01-11 MED ORDER — FAMOTIDINE IN NACL 20-0.9 MG/50ML-% IV SOLN
20.0000 mg | Freq: Once | INTRAVENOUS | Status: AC
  Administered 2017-01-11: 20 mg via INTRAVENOUS

## 2017-01-11 MED ORDER — SODIUM CHLORIDE 0.9% FLUSH
10.0000 mL | INTRAVENOUS | Status: DC | PRN
  Administered 2017-01-11: 10 mL
  Filled 2017-01-11: qty 10

## 2017-01-11 MED ORDER — SODIUM CHLORIDE 0.9 % IV SOLN
20.0000 mg | Freq: Once | INTRAVENOUS | Status: AC
  Administered 2017-01-11: 20 mg via INTRAVENOUS
  Filled 2017-01-11: qty 2

## 2017-01-11 MED ORDER — DEXAMETHASONE SODIUM PHOSPHATE 10 MG/ML IJ SOLN
INTRAMUSCULAR | Status: AC
  Filled 2017-01-11: qty 1

## 2017-01-11 MED ORDER — SODIUM CHLORIDE 0.9 % IV SOLN
250.0000 mL | Freq: Once | INTRAVENOUS | Status: AC
  Administered 2017-01-11: 20 mL via INTRAVENOUS

## 2017-01-11 MED ORDER — HEPARIN SOD (PORK) LOCK FLUSH 100 UNIT/ML IV SOLN
500.0000 [IU] | Freq: Once | INTRAVENOUS | Status: AC | PRN
  Administered 2017-01-11: 500 [IU]
  Filled 2017-01-11: qty 5

## 2017-01-11 MED ORDER — DIPHENHYDRAMINE HCL 25 MG PO CAPS: 25.0000 mg | ORAL_CAPSULE | Freq: Once | ORAL | Status: DC

## 2017-01-11 MED ORDER — DIPHENHYDRAMINE HCL 50 MG/ML IJ SOLN
50.0000 mg | Freq: Once | INTRAMUSCULAR | Status: AC
  Administered 2017-01-11: 50 mg via INTRAVENOUS

## 2017-01-11 MED ORDER — DIPHENHYDRAMINE HCL 25 MG PO CAPS
ORAL_CAPSULE | ORAL | Status: AC
  Filled 2017-01-11: qty 2

## 2017-01-11 MED ORDER — ACETAMINOPHEN 325 MG PO TABS
650.0000 mg | ORAL_TABLET | Freq: Once | ORAL | Status: AC
  Administered 2017-01-11: 650 mg via ORAL

## 2017-01-11 MED ORDER — DIPHENHYDRAMINE HCL 50 MG/ML IJ SOLN
INTRAMUSCULAR | Status: AC
  Filled 2017-01-11: qty 1

## 2017-01-11 MED ORDER — PACLITAXEL CHEMO INJECTION 300 MG/50ML
45.0000 mg/m2 | Freq: Once | INTRAVENOUS | Status: AC
  Administered 2017-01-11: 72 mg via INTRAVENOUS
  Filled 2017-01-11: qty 12

## 2017-01-11 MED ORDER — FAMOTIDINE IN NACL 20-0.9 MG/50ML-% IV SOLN
INTRAVENOUS | Status: AC
  Filled 2017-01-11: qty 50

## 2017-01-11 MED ORDER — PALONOSETRON HCL INJECTION 0.25 MG/5ML
0.2500 mg | Freq: Once | INTRAVENOUS | Status: AC
  Administered 2017-01-11: 0.25 mg via INTRAVENOUS

## 2017-01-11 MED ORDER — SODIUM CHLORIDE 0.9 % IV SOLN
Freq: Once | INTRAVENOUS | Status: AC
  Administered 2017-01-11: 11:00:00 via INTRAVENOUS

## 2017-01-11 MED ORDER — DARBEPOETIN ALFA 500 MCG/ML IJ SOSY
500.0000 ug | PREFILLED_SYRINGE | Freq: Once | INTRAMUSCULAR | Status: AC
  Administered 2017-01-11: 500 ug via SUBCUTANEOUS
  Filled 2017-01-11: qty 1

## 2017-01-11 NOTE — Patient Instructions (Signed)
Implanted Port Home Guide An implanted port is a type of central line that is placed under the skin. Central lines are used to provide IV access when treatment or nutrition needs to be given through a person's veins. Implanted ports are used for long-term IV access. An implanted port may be placed because:  You need IV medicine that would be irritating to the small veins in your hands or arms.  You need long-term IV medicines, such as antibiotics.  You need IV nutrition for a long period.  You need frequent blood draws for lab tests.  You need dialysis.  Implanted ports are usually placed in the chest area, but they can also be placed in the upper arm, the abdomen, or the leg. An implanted port has two main parts:  Reservoir. The reservoir is round and will appear as a small, raised area under your skin. The reservoir is the part where a needle is inserted to give medicines or draw blood.  Catheter. The catheter is a thin, flexible tube that extends from the reservoir. The catheter is placed into a large vein. Medicine that is inserted into the reservoir goes into the catheter and then into the vein.  How will I care for my incision site? Do not get the incision site wet. Bathe or shower as directed by your health care provider. How is my port accessed? Special steps must be taken to access the port:  Before the port is accessed, a numbing cream can be placed on the skin. This helps numb the skin over the port site.  Your health care provider uses a sterile technique to access the port. ? Your health care provider must put on a mask and sterile gloves. ? The skin over your port is cleaned carefully with an antiseptic and allowed to dry. ? The port is gently pinched between sterile gloves, and a needle is inserted into the port.  Only "non-coring" port needles should be used to access the port. Once the port is accessed, a blood return should be checked. This helps ensure that the port  is in the vein and is not clogged.  If your port needs to remain accessed for a constant infusion, a clear (transparent) bandage will be placed over the needle site. The bandage and needle will need to be changed every week, or as directed by your health care provider.  Keep the bandage covering the needle clean and dry. Do not get it wet. Follow your health care provider's instructions on how to take a shower or bath while the port is accessed.  If your port does not need to stay accessed, no bandage is needed over the port.  What is flushing? Flushing helps keep the port from getting clogged. Follow your health care provider's instructions on how and when to flush the port. Ports are usually flushed with saline solution or a medicine called heparin. The need for flushing will depend on how the port is used.  If the port is used for intermittent medicines or blood draws, the port will need to be flushed: ? After medicines have been given. ? After blood has been drawn. ? As part of routine maintenance.  If a constant infusion is running, the port may not need to be flushed.  How long will my port stay implanted? The port can stay in for as long as your health care provider thinks it is needed. When it is time for the port to come out, surgery will be   done to remove it. The procedure is similar to the one performed when the port was put in. When should I seek immediate medical care? When you have an implanted port, you should seek immediate medical care if:  You notice a bad smell coming from the incision site.  You have swelling, redness, or drainage at the incision site.  You have more swelling or pain at the port site or the surrounding area.  You have a fever that is not controlled with medicine.  This information is not intended to replace advice given to you by your health care provider. Make sure you discuss any questions you have with your health care provider. Document  Released: 04/25/2005 Document Revised: 10/01/2015 Document Reviewed: 12/31/2012 Elsevier Interactive Patient Education  2017 [employer]  

## 2017-01-11 NOTE — Patient Instructions (Signed)
[facility] Discharge Instructions for Patients Receiving Chemotherapy  Today you received the following chemotherapy agents taxol and carboplatin  To help prevent nausea and vomiting after your treatment, we encourage you to take your nausea medication as dirceted   If you develop nausea and vomiting that is not controlled by your nausea medication, call the clinic.   BELOW ARE SYMPTOMS THAT SHOULD BE REPORTED IMMEDIATELY:  *FEVER GREATER THAN 100.5 F  *CHILLS WITH OR WITHOUT FEVER  NAUSEA AND VOMITING THAT IS NOT CONTROLLED WITH YOUR NAUSEA MEDICATION  *UNUSUAL SHORTNESS OF BREATH  *UNUSUAL BRUISING OR BLEEDING  TENDERNESS IN MOUTH AND THROAT WITH OR WITHOUT PRESENCE OF ULCERS  *URINARY PROBLEMS  *BOWEL PROBLEMS  UNUSUAL RASH Items with * indicate a potential emergency and should be followed up as soon as possible.  Feel free to call the clinic you have any questions or concerns. The clinic phone number is [phone].  Please show the [facility] at check-in to the Emergency Department and triage nurse.    Blood Transfusion, Adult A blood transfusion is a procedure in which you receive donated blood, including plasma, platelets, and red blood cells, through an IV tube. You may need a blood transfusion because of illness, surgery, or injury. The blood may come from a donor. You may also be able to donate blood for yourself (autologous blood donation) before a surgery if you know that you might require a blood transfusion. The blood given in a transfusion is made up of different types of cells. You may receive:  Red blood cells. These carry oxygen to the cells in the body.  White blood cells. These help you fight infections.  Platelets. These help your blood to clot.  Plasma. This is the liquid part of your blood and it helps with fluid imbalances.  If you have hemophilia or another clotting disorder, you may also receive other types of blood  products. Tell a health care provider about:  Any allergies you have.  All medicines you are taking, including vitamins, herbs, eye drops, creams, and over-the-counter medicines.  Any problems you or family members have had with anesthetic medicines.  Any blood disorders you have.  Any surgeries you have had.  Any medical conditions you have, including any recent fever or cold symptoms.  Whether you are pregnant or may be pregnant.  Any previous reactions you have had during a blood transfusion. What are the risks? Generally, this is a safe procedure. However, problems may occur, including:  Having an allergic reaction to something in the donated blood. Hives and itching may be symptoms of this type of reaction.  Fever. This may be a reaction to the white blood cells in the transfused blood. Nausea or chest pain may accompany a fever.  Iron overload. This can happen from having many transfusions.  Transfusion-related acute lung injury (TRALI). This is a rare reaction that causes lung damage. The cause is not known.TRALI can occur within hours of a transfusion or several days later.  Sudden (acute) or delayed hemolytic reactions. This happens if your blood does not match the cells in your transfusion. Your body's defense system (immune system) may try to attack the new cells. This complication is rare. The symptoms include fever, chills, nausea, and low back pain or chest pain.  Infection or disease transmission. This is rare.  What happens before the procedure?  You will have a blood test to determine your blood type. This is necessary to know what  kind of blood your body will accept and to match it to the donor blood.  If you are going to have a planned surgery, you may be able to do an autologous blood donation. This may be done in case you need to have a transfusion.  If you have had an allergic reaction to a transfusion in the past, you may be given medicine to help  prevent a reaction. This medicine may be given to you by mouth or through an IV tube.  You will have your temperature, blood pressure, and pulse monitored before the transfusion.  Follow instructions from your health care provider about eating and drinking restrictions.  Ask your health care provider about: ? Changing or stopping your regular medicines. This is especially important if you are taking diabetes medicines or blood thinners. ? Taking medicines such as aspirin and ibuprofen. These medicines can thin your blood. Do not take these medicines before your procedure if your health care provider instructs you not to. What happens during the procedure?  An IV tube will be inserted into one of your veins.  The bag of donated blood will be attached to your IV tube. The blood will then enter through your vein.  Your temperature, blood pressure, and pulse will be monitored regularly during the transfusion. This monitoring is done to detect early signs of a transfusion reaction.  If you have any signs or symptoms of a reaction, your transfusion will be stopped and you may be given medicine.  When the transfusion is complete, your IV tube will be removed.  Pressure may be applied to the IV site for a few minutes.  A bandage (dressing) will be applied. The procedure may vary among health care providers and hospitals. What happens after the procedure?  Your temperature, blood pressure, heart rate, breathing rate, and blood oxygen level will be monitored often.  Your blood may be tested to see how you are responding to the transfusion.  You may be warmed with fluids or blankets to maintain a normal body temperature. Summary  A blood transfusion is a procedure in which you receive donated blood, including plasma, platelets, and red blood cells, through an IV tube.  Your temperature, blood pressure, and pulse will be monitored before, during, and after the transfusion.  Your blood may  be tested after the transfusion to see how your body has responded. This information is not intended to replace advice given to you by your health care provider. Make sure you discuss any questions you have with your health care provider. Document Released: 04/22/2000 Document Revised: 01/21/2016 Document Reviewed: 01/21/2016 Elsevier Interactive Patient Education  [employer].

## 2017-01-11 NOTE — Progress Notes (Signed)
Nutrition follow-up completed with patient during chemotherapy for metastatic lung cancer. Weight improved documented as 109.4 pounds August 29 increased from 108 pounds June 6. Patient continues to drink oral nutrition supplements.   He is requesting coupons. He denies nutrition impact symptoms and states over all, he is doing well.  Nutrition diagnosis: Underweight continues.  Intervention: Provided support and encouragement for patient to continue strategies for increased calories and protein. Encouraged oral nutrition supplements. Teach back method used.  Monitoring, evaluation, goals: Patient will work to continue to increase calories and protein for weight gain/weight maintenance.  Next visit: To be scheduled as needed.  **Disclaimer: This note was dictated with voice recognition software. Similar sounding words can inadvertently be transcribed and this note may contain transcription errors which may not have been corrected upon publication of note.**

## 2017-01-12 ENCOUNTER — Encounter: Payer: Self-pay | Admitting: Hematology and Oncology

## 2017-01-12 DIAGNOSIS — D696 Thrombocytopenia, unspecified: Secondary | ICD-10-CM | POA: Insufficient documentation

## 2017-01-12 LAB — TYPE AND SCREEN
ABO/RH(D): O POS
Antibody Screen: NEGATIVE
Donor AG Type: NEGATIVE
Unit division: 0

## 2017-01-12 LAB — BPAM RBC
BLOOD PRODUCT EXPIRATION DATE: 201810032359
ISSUE DATE / TIME: 201809051404
Unit Type and Rh: 5100

## 2017-01-12 NOTE — Assessment & Plan Note (Signed)
He had responded well to Darbopeitin in the past with improvement in energy level.  The patient will get regular blood work done. The patient will continue on Aranep injection to keep hemoglobin greater than 10 g. He will continue to get darbepoetin injection while on treatment, scheduled to be given every 2 weeks. He had recently received blood transfusion with symptomatic improvement.  He will continue to receive blood if hemoglobin is less than 8 Today, he is mildly symptomatic. We discussed some of the risks, benefits, and alternatives of blood transfusions. The patient is symptomatic from anemia and the hemoglobin level is critically low.  Some of the side-effects to be expected including risks of transfusion reactions, chills, infection, syndrome of volume overload and risk of hospitalization from various reasons and the patient is willing to proceed and went ahead to sign consent today. I plan to give him a unit of blood after chemo today

## 2017-01-12 NOTE — Assessment & Plan Note (Signed)
Recent PET CT scan on 11/29/16 showed positive response to treatment I recommend we continue the same dose of chemotherapy, carboplatin and Taxol on days 1 and 8 and rest day 15 He will get Neulasta support after day 8 treatment I plan to see him back in a few weeks for further supportive care and I plan to repeat imaging study again in October

## 2017-01-12 NOTE — Assessment & Plan Note (Signed)
This is likely due to recent treatment. The patient denies recent history of bleeding such as epistaxis, hematuria or hematochezia. He is asymptomatic from the low platelet count. I will observe for now.  he does not require transfusion now. I will continue the chemotherapy at current dose without dosage adjustment.  If the thrombocytopenia gets progressive worse in the future, I might have to delay his treatment or adjust the chemotherapy dose.

## 2017-01-12 NOTE — Progress Notes (Signed)
[place] OFFICE PROGRESS NOTE  Patient Care Team: [name], MD as PCP - General (Family Medicine) [name], MD as Consulting Physician (Hematology and Oncology)  SUMMARY OF ONCOLOGIC HISTORY: Oncology History   MDS, R-IPSS score of 3, low risk (Hg 8.9, +16 chromosome on BM, 2% blast count)   Squamous cell carcinoma of the epiglottis   Primary site: Larynx - Supraglottis (Left)   Staging method: AJCC 7th Edition   Clinical: Stage I (T1, N0, M0) signed by [name], MD on 04/30/2013 12:49 PM   Pathologic: Stage I (T1, N0, cM0) signed by [name], MD on 04/30/2013 12:49 PM   Summary: Stage I (T1, N0, cM0)       MDS (myelodysplastic syndrome), low grade ([place])   02/01/2007 Bone Marrow Biopsy    BM biopsy was abnormal, overall probable low grade MDS      03/23/2011 - 02/06/2013 Chemotherapy    [name], discontinued due to diagnosis of laryngeal ca      09/06/2013 Bone Marrow Biopsy    Repeat bone marrow aspirate and biopsy confirmed low-grade myelodysplastic syndrome.      09/17/2013 -  Chemotherapy    The patient resumed darbepoetin injections to treat the anemia.       History of head and neck cancer   03/06/2013 Procedure    Biopsy from epiglottic region showed invasive [facility]      04/25/2013 Imaging    Ct scan showed no other involvement in the LN      05/13/2013 - 06/28/2013 Radiation Therapy    He received radiation therapy, 70 gray in 35 fractions to the larynx      06/09/2016 Imaging    CT scan of the chest showed Bilateral spiculated soft tissue pulmonary masses, highly suspicious for multifocal malignancy. Borderline enlarged left-sided mediastinal lymphadenopathy. Background of severe emphysematous changes, chronic cylindrical bronchiectasis and hyperinflation of the lungs. Findings consistent with longstanding COPD. Diffusely thickened interstitial markings. This may represent chronic interstitial lung changes, however  lymphangitic spread of malignancy cannot be excluded. Anterior compression deformities of several of the thoracic vertebral bodies, with heterogeneous appearance of T7 and T9 vertebral body. This may represent degenerative changes, however osseous metastatic disease cannot be excluded.       07/11/2016 PET scan    There are 2 nodules in the left upper lobe and 2 nodules within the right lower lobe which have spiculated margin and exhibit intense radiotracer uptake compatible with multifocal pulmonary metastasis versus multiple synchronous primary pulmonary neoplasms. 2. Emphysema 3. Aortic atherosclerosis and multi vessel coronary artery calcification.       Cancer of upper lobe of left lung ([place])   06/09/2016 Imaging    CT chest: Bilateral spiculated soft tissue pulmonary masses, highly suspicious for multifocal malignancy. Borderline enlarged left-sided mediastinal lymphadenopathy. Background of severe emphysematous changes, chronic cylindrical bronchiectasis and hyperinflation of the lungs. Findings consistent with longstanding COPD. Diffusely thickened interstitial markings. This may represent chronic interstitial lung changes, however lymphangitic spread of malignancy cannot be excluded. Anterior compression deformities of several of the thoracic vertebral bodies, with heterogeneous appearance of T7 and T9 vertebral body. This may represent degenerative changes, however osseous metastatic disease cannot be excluded.       06/29/2016 Imaging    MRI brain: No evidence of metastatic disease or recent infarction. Advanced generalized brain atrophy with moderate to marked chronic small-vessel ischemic changes throughout. FLAIR imaging appears to show scattered gyri showing FLAIR hyperintensity. These abnormalities are not  confirmed with restricted diffusion or contrast enhancement. Therefore, we are not certain that this is not artifactual. The differential diagnosis does include true pathology such  as posterior reversible encephalopathy, viral or prion disease, postictal change in post chemotherapy change. If there is concern about active CNS pathology, re- scanning in 4-6 weeks could be useful.      07/27/2016 Imaging    CT chest: Bilateral pulmonary lesions. These are suspicious for metastatic disease. Lesions have minimally changed but there may be slight enlargement of the cavitary lesion in the right lower lobe. Severe emphysematous changes.       08/10/2016 Pathology Results    1. Lung, biopsy, RUL, (apical segment) - SCANT BENIGN LUNG PARENCHYMA. - THERE IS NO EVIDENCE OF MALIGNANCY. 2. Lung, biopsy, LUL lingula - SQUAMOUS CELL CARCINOMA. - SEE COMMENT. 3. Lung, biopsy, LUL - ADENOCARCINOMA. - SEE COMMENT. Microscopic Comment 2. The malignant cells are positive for cytokeratin 56 and p63. They are negative for TTF-1. The findings are consistent with squamous cell carcinoma. There is likely insufficient tissue remaining for additional studies, if requested. 3. The malignant cells are positive for TTF-1 and negative for p63 and cytokeratin 5/6. The findings are consistent with adenocarcinoma.      08/10/2016 Procedure    The targets were defined as follows: Left upper lobe nodule, target 1 Lingular nodule, target 2 Proximal right lower lobe nodule, target 3 More distal right lower lobe, nodule  The extendable working channel was secured into place and the locator guide was withdrawn. Under fluoroscopic guidance transbronchial needle brushings, transbronchial Wang needle biopsies, and transbronchial forceps biopsies were performed in the LUL (target 1) and the lingula (target 2) to be sent for cytology and pathology. A bronchioalveolar lavage was performed in the lingula in the vicinity of target 2  and sent for cytology and microbiology (bacterial, fungal, AFB smears and cultures).  Fiducial markers were then placed around target 1, target 2, and in the right lower lobe in the  vicinity of both target 3 and target 4 to be used for possible radiation therapy should this be indicated. At the end of the procedure a general airway inspection was performed and there was no evidence of active bleeding. The bronchoscope was removed.  The patient tolerated the procedure well. There was no significant blood loss and there were no obvious complications. A post-procedural chest x-ray is pending.  Samples: 1. Transbronchial needle brushings from targets 1 and 2 2. Transbronchial Wang needle biopsies from targets 1 and 2 3. Transbronchial forceps biopsies from targets 1 and 2 4. Bronchoalveolar lavage from lingula, target 2 5. Endobronchial biopsies from RUL apical segment 6. Endobronchial brushings from right upper lobe apical segment       08/10/2016 Genetic Testing    Patient has genetic testing done for [employer] and PD-L1 testing Results revealed PD-L1 testing is negative 0%      08/31/2016 Procedure    Technically successful right IJ power-injectable port catheter placement. Ready for routine use      11/30/2016 PET scan    1. Interval resection of the hypermetabolic lingular nodule. 2. The 3 other spiculated, hypermetabolic nodules within the left upper and right lower lobes are slightly smaller with decreased metabolic activity, consistent with some response to interval therapy. 3. No progressive disease       INTERVAL HISTORY: Please see below for problem oriented charting. He returns today with his wife He tolerated last cycle treatment well Denies peripheral neuropathy Denies recent infection.  He did not smoke recently.  No chest pain, cough or shortness of breath He is mildly fatigued. The patient denies any recent signs or symptoms of bleeding such as spontaneous epistaxis, hematuria or hematochezia.  REVIEW OF SYSTEMS:   Constitutional: Denies fevers, chills or abnormal weight loss Eyes: Denies blurriness of vision Ears, nose, mouth, throat,  and face: Denies mucositis or sore throat Respiratory: Denies cough, dyspnea or wheezes Cardiovascular: Denies palpitation, chest discomfort or lower extremity swelling Gastrointestinal:  Denies nausea, heartburn or change in bowel habits Skin: Denies abnormal skin rashes Lymphatics: Denies new lymphadenopathy or easy bruising Neurological:Denies numbness, tingling or new weaknesses Behavioral/Psych: Mood is stable, no new changes  All other systems were reviewed with the patient and are negative.  I have reviewed the past medical history, past surgical history, social history and family history with the patient and they are unchanged from previous note.  ALLERGIES:  is allergic to no known allergies.  MEDICATIONS:  Current Outpatient Prescriptions  Medication Sig Dispense Refill  . albuterol (PROVENTIL HFA;VENTOLIN HFA) 108 (90 Base) MCG/ACT inhaler Inhale 2 puffs into the lungs every 4 (four) hours as needed for wheezing or shortness of breath. 1 Inhaler 5  . cyanocobalamin 500 MCG tablet Take 500 mcg by mouth daily.    . ferrous sulfate 325 (65 FE) MG tablet Take 325 mg by mouth daily with breakfast.    . folic acid (FOLVITE) 1 MG tablet Take 1 mg by mouth daily.    . Glycopyrrolate-Formoterol (BEVESPI AEROSPHERE) 9-4.8 MCG/ACT AERO Inhale 2 puffs into the lungs 2 (two) times daily. 2 Inhaler 0  . levETIRAcetam (KEPPRA) 500 MG tablet Take 1 tablet (500 mg total) by mouth 2 (two) times daily. 60 tablet 3  . lidocaine-prilocaine (EMLA) cream Apply to affected area once 30 g 3  . ondansetron (ZOFRAN) 8 MG tablet Take 1 tablet (8 mg total) by mouth 2 (two) times daily as needed for refractory nausea / vomiting. Start on day 3 after chemo. 30 tablet 1  . predniSONE (DELTASONE) 5 MG tablet TAKE 1 TABLET (5 MG TOTAL) BY MOUTH DAILY WITH BREAKFAST. 30 tablet 1  . prochlorperazine (COMPAZINE) 10 MG tablet Take 10 mg by mouth every 6 (six) hours as needed.    . vitamin E 1000 UNIT capsule Take  1,000 Units by mouth daily.      No current facility-administered medications for this visit.     PHYSICAL EXAMINATION: ECOG PERFORMANCE STATUS: 1 - Symptomatic but completely ambulatory  Vitals:   01/11/17 1007  BP: (!) 116/52  Pulse: 78  Resp: 18  Temp: 97.8 F (36.6 C)  SpO2: 100%   Filed Weights   01/11/17 1007  Weight: 109 lb 4.8 oz (49.6 kg)    GENERAL:alert, no distress and comfortable.  He is thin SKIN: skin color, texture, turgor are normal, no rashes or significant lesions EYES: normal, Conjunctiva are pink and non-injected, sclera clear OROPHARYNX:no exudate, no erythema and lips, buccal mucosa, and tongue normal  NECK: supple, thyroid normal size, non-tender, without nodularity LYMPH:  no palpable lymphadenopathy in the cervical, axillary or inguinal LUNGS: clear to auscultation and percussion with normal breathing effort HEART: regular rate & rhythm and no murmurs and no lower extremity edema ABDOMEN:abdomen soft, non-tender and normal bowel sounds Musculoskeletal:no cyanosis of digits and no clubbing  NEURO: alert & oriented x 3 with fluent speech, no focal motor/sensory deficits  LABORATORY DATA:  I have reviewed the data as listed    Component Value  Date/Time   NA 138 01/11/2017 0925   K 5.0 01/11/2017 0925   CL 108 08/31/2016 1155   CL 107 09/19/2012 1325   CO2 19 (L) 01/11/2017 0925   GLUCOSE 88 01/11/2017 0925   GLUCOSE 97 09/19/2012 1325   BUN 30.8 (H) 01/11/2017 0925   CREATININE 0.9 01/11/2017 0925   CALCIUM 9.1 01/11/2017 0925   PROT 8.0 01/11/2017 0925   ALBUMIN 3.3 (L) 01/11/2017 0925   AST 11 01/11/2017 0925   ALT <6 01/11/2017 0925   ALKPHOS 88 01/11/2017 0925   BILITOT 0.37 01/11/2017 0925   GFRNONAA >60 08/31/2016 1155   GFRAA >60 08/31/2016 1155    No results found for: SPEP, UPEP  Lab Results  Component Value Date   WBC 8.7 01/11/2017   NEUTROABS 7.4 (H) 01/11/2017   HGB 7.6 (L) 01/11/2017   HCT 24.1 (L) 01/11/2017   MCV  99.2 (H) 01/11/2017   PLT 136 (L) 01/11/2017      Chemistry      Component Value Date/Time   NA 138 01/11/2017 0925   K 5.0 01/11/2017 0925   CL 108 08/31/2016 1155   CL 107 09/19/2012 1325   CO2 19 (L) 01/11/2017 0925   BUN 30.8 (H) 01/11/2017 0925   CREATININE 0.9 01/11/2017 0925      Component Value Date/Time   CALCIUM 9.1 01/11/2017 0925   ALKPHOS 88 01/11/2017 0925   AST 11 01/11/2017 0925   ALT <6 01/11/2017 0925   BILITOT 0.37 01/11/2017 0925       ASSESSMENT & PLAN:  Cancer of upper lobe of left lung ([facility]) Recent PET CT scan on 11/29/16 showed positive response to treatment I recommend we continue the same dose of chemotherapy, carboplatin and Taxol on days 1 and 8 and rest day 15 He will get Neulasta support after day 8 treatment I plan to see him back in a few weeks for further supportive care and I plan to repeat imaging study again in October   History of head and neck cancer Repeat PET scan showed no evidence of recurrence of disease  MDS (myelodysplastic syndrome), low grade ([place]) He had responded well to Darbopeitin in the past with improvement in energy level.  The patient will get regular blood work done. The patient will continue on Aranep injection to keep hemoglobin greater than 10 g. He will continue to get darbepoetin injection while on treatment, scheduled to be given every 2 weeks. He had recently received blood transfusion with symptomatic improvement.  He will continue to receive blood if hemoglobin is less than 8 Today, he is mildly symptomatic. We discussed some of the risks, benefits, and alternatives of blood transfusions. The patient is symptomatic from anemia and the hemoglobin level is critically low.  Some of the side-effects to be expected including risks of transfusion reactions, chills, infection, syndrome of volume overload and risk of hospitalization from various reasons and the patient is willing to proceed and went ahead to sign  consent today. I plan to give him a unit of blood after chemo today  Malignant cachexia ([place]) His appetite is improved. He is doing well on low-dose prednisone therapy He has not lost any weight Continue the same  Thrombocytopenia [facility]) This is likely due to recent treatment. The patient denies recent history of bleeding such as epistaxis, hematuria or hematochezia. He is asymptomatic from the low platelet count. I will observe for now.  he does not require transfusion now. I will continue the chemotherapy  at current dose without dosage adjustment.  If the thrombocytopenia gets progressive worse in the future, I might have to delay his treatment or adjust the chemotherapy dose.   No orders of the defined types were placed in this encounter.  All questions were answered. The patient knows to call the clinic with any problems, questions or concerns. No barriers to learning was detected. I spent 25 minutes counseling the patient face to face. The total time spent in the appointment was 30 minutes and more than 50% was on counseling and review of test results     [name], MD 01/12/2017 6:49 AM

## 2017-01-12 NOTE — Assessment & Plan Note (Signed)
His appetite is improved. He is doing well on low-dose prednisone therapy He has not lost any weight Continue the same

## 2017-01-12 NOTE — Assessment & Plan Note (Signed)
Repeat PET scan showed no evidence of recurrence of disease

## 2017-01-13 ENCOUNTER — Telehealth: Payer: Self-pay | Admitting: Hematology and Oncology

## 2017-01-13 ENCOUNTER — Ambulatory Visit (HOSPITAL_BASED_OUTPATIENT_CLINIC_OR_DEPARTMENT_OTHER): Payer: Medicare Other

## 2017-01-13 VITALS — BP 125/69 | HR 90 | Temp 98.0°F | Resp 20

## 2017-01-13 DIAGNOSIS — C7802 Secondary malignant neoplasm of left lung: Secondary | ICD-10-CM

## 2017-01-13 DIAGNOSIS — Z5189 Encounter for other specified aftercare: Secondary | ICD-10-CM | POA: Diagnosis not present

## 2017-01-13 DIAGNOSIS — C3412 Malignant neoplasm of upper lobe, left bronchus or lung: Secondary | ICD-10-CM | POA: Diagnosis not present

## 2017-01-13 DIAGNOSIS — C801 Malignant (primary) neoplasm, unspecified: Secondary | ICD-10-CM

## 2017-01-13 MED ORDER — PEGFILGRASTIM INJECTION 6 MG/0.6ML ~~LOC~~
6.0000 mg | PREFILLED_SYRINGE | Freq: Once | SUBCUTANEOUS | Status: AC
  Administered 2017-01-13: 6 mg via SUBCUTANEOUS
  Filled 2017-01-13: qty 0.6

## 2017-01-13 NOTE — Telephone Encounter (Signed)
Left message for patient regarding upcoming September appointments.

## 2017-01-13 NOTE — Patient Instructions (Signed)
Pegfilgrastim injection What is this medicine? PEGFILGRASTIM (PEG fil gra stim) is a long-acting granulocyte colony-stimulating factor that stimulates the growth of neutrophils, a type of white blood cell important in the body's fight against infection. It is used to reduce the incidence of fever and infection in patients with certain types of cancer who are receiving chemotherapy that affects the bone marrow, and to increase survival after being exposed to high doses of radiation. This medicine may be used for other purposes; ask your health care provider or pharmacist if you have questions. COMMON BRAND NAME(S): Neulasta What should I tell my health care provider before I take this medicine? They need to know if you have any of these conditions: -kidney disease -latex allergy -ongoing radiation therapy -sickle cell disease -skin reactions to acrylic adhesives (On-Body Injector only) -an unusual or allergic reaction to pegfilgrastim, filgrastim, other medicines, foods, dyes, or preservatives -pregnant or trying to get pregnant -breast-feeding How should I use this medicine? This medicine is for injection under the skin. If you get this medicine at home, you will be taught how to prepare and give the pre-filled syringe or how to use the On-body Injector. Refer to the patient Instructions for Use for detailed instructions. Use exactly as directed. Tell your healthcare provider immediately if you suspect that the On-body Injector may not have performed as intended or if you suspect the use of the On-body Injector resulted in a missed or partial dose. It is important that you put your used needles and syringes in a special sharps container. Do not put them in a trash can. If you do not have a sharps container, call your pharmacist or healthcare provider to get one. Talk to your pediatrician regarding the use of this medicine in children. While this drug may be prescribed for selected conditions,  precautions do apply. Overdosage: If you think you have taken too much of this medicine contact a poison control center or emergency room at once. NOTE: This medicine is only for you. Do not share this medicine with others. What if I miss a dose? It is important not to miss your dose. Call your doctor or health care professional if you miss your dose. If you miss a dose due to an On-body Injector failure or leakage, a new dose should be administered as soon as possible using a single prefilled syringe for manual use. What may interact with this medicine? Interactions have not been studied. Give your health care provider a list of all the medicines, herbs, non-prescription drugs, or dietary supplements you use. Also tell them if you smoke, drink alcohol, or use illegal drugs. Some items may interact with your medicine. This list may not describe all possible interactions. Give your health care provider a list of all the medicines, herbs, non-prescription drugs, or dietary supplements you use. Also tell them if you smoke, drink alcohol, or use illegal drugs. Some items may interact with your medicine. What should I watch for while using this medicine? You may need blood work done while you are taking this medicine. If you are going to need a MRI, CT scan, or other procedure, tell your doctor that you are using this medicine (On-Body Injector only). What side effects may I notice from receiving this medicine? Side effects that you should report to your doctor or health care professional as soon as possible: -allergic reactions like skin rash, itching or hives, swelling of the face, lips, or tongue -dizziness -fever -pain, redness, or irritation at site   where injected -pinpoint red spots on the skin -red or dark-brown urine -shortness of breath or breathing problems -stomach or side pain, or pain at the shoulder -swelling -tiredness -trouble passing urine or change in the amount of urine Side  effects that usually do not require medical attention (report to your doctor or health care professional if they continue or are bothersome): -bone pain -muscle pain This list may not describe all possible side effects. Call your doctor for medical advice about side effects. You may report side effects to FDA at 1-800-FDA-1088. Where should I keep my medicine? Keep out of the reach of children. Store pre-filled syringes in a refrigerator between 2 and 8 degrees C (36 and 46 degrees F). Do not freeze. Keep in carton to protect from light. Throw away this medicine if it is left out of the refrigerator for more than 48 hours. Throw away any unused medicine after the expiration date. NOTE: This sheet is a summary. It may not cover all possible information. If you have questions about this medicine, talk to your doctor, pharmacist, or health care provider.  2018 Elsevier/Gold Standard (2016-04-21 12:58:03)  

## 2017-01-25 ENCOUNTER — Ambulatory Visit (HOSPITAL_BASED_OUTPATIENT_CLINIC_OR_DEPARTMENT_OTHER): Payer: Medicare Other

## 2017-01-25 ENCOUNTER — Other Ambulatory Visit (HOSPITAL_BASED_OUTPATIENT_CLINIC_OR_DEPARTMENT_OTHER): Payer: Medicare Other

## 2017-01-25 ENCOUNTER — Other Ambulatory Visit: Payer: Self-pay | Admitting: Hematology and Oncology

## 2017-01-25 VITALS — BP 123/70 | HR 61 | Temp 97.6°F | Resp 16

## 2017-01-25 DIAGNOSIS — Z5111 Encounter for antineoplastic chemotherapy: Secondary | ICD-10-CM | POA: Diagnosis not present

## 2017-01-25 DIAGNOSIS — C7802 Secondary malignant neoplasm of left lung: Secondary | ICD-10-CM

## 2017-01-25 DIAGNOSIS — D4620 Refractory anemia with excess of blasts, unspecified: Secondary | ICD-10-CM

## 2017-01-25 DIAGNOSIS — C3412 Malignant neoplasm of upper lobe, left bronchus or lung: Secondary | ICD-10-CM | POA: Diagnosis not present

## 2017-01-25 DIAGNOSIS — C801 Malignant (primary) neoplasm, unspecified: Secondary | ICD-10-CM

## 2017-01-25 LAB — COMPREHENSIVE METABOLIC PANEL
ALT: <3 U/L (ref 0–55)
AST: 9 U/L (ref 5–34)
Albumin: 3.1 g/dL — ABNORMAL LOW (ref 3.5–5.0)
Alkaline Phosphatase: 112 U/L (ref 40–150)
Anion Gap: 8 mEq/L (ref 3–11)
BUN: 21 mg/dL (ref 7.0–26.0)
CALCIUM: 8.9 mg/dL (ref 8.4–10.4)
CHLORIDE: 113 meq/L — AB (ref 98–109)
CO2: 20 meq/L — AB (ref 22–29)
Creatinine: 0.9 mg/dL (ref 0.7–1.3)
EGFR: >90 mL/min/{1.73_m2} (ref >90)
Glucose: 85 mg/dl (ref 70–140)
POTASSIUM: 4.3 meq/L (ref 3.5–5.1)
Sodium: 140 mEq/L (ref 136–145)
Total Bilirubin: 0.4 mg/dL (ref 0.20–1.20)
Total Protein: 7.7 g/dL (ref 6.4–8.3)

## 2017-01-25 LAB — CBC WITH DIFFERENTIAL/PLATELET
BASO%: 0.1 % (ref 0.0–2.0)
BASOS ABS: 0 10*3/uL (ref 0.0–0.1)
EOS%: 0.3 % (ref 0.0–7.0)
Eosinophils Absolute: 0.1 10*3/uL (ref 0.0–0.5)
HCT: 23.2 % — ABNORMAL LOW (ref 38.4–49.9)
HGB: 7.5 g/dL — ABNORMAL LOW (ref 13.0–17.1)
LYMPH#: 0.8 10*3/uL — AB (ref 0.9–3.3)
LYMPH%: 4.4 % — AB (ref 14.0–49.0)
MCH: 30.6 pg (ref 27.2–33.4)
MCHC: 32.3 g/dL (ref 32.0–36.0)
MCV: 94.7 fL (ref 79.3–98.0)
MONO#: 1.1 10*3/uL — ABNORMAL HIGH (ref 0.1–0.9)
MONO%: 6.1 % (ref 0.0–14.0)
NEUT#: 16.5 10*3/uL — ABNORMAL HIGH (ref 1.5–6.5)
NEUT%: 89.1 % — AB (ref 39.0–75.0)
Platelets: 163 10*3/uL (ref 140–400)
RBC: 2.45 10*6/uL — AB (ref 4.20–5.82)
RDW: 29 % — ABNORMAL HIGH (ref 11.0–14.6)
WBC: 18.5 10*3/uL — ABNORMAL HIGH (ref 4.0–10.3)

## 2017-01-25 LAB — PREPARE RBC (CROSSMATCH)

## 2017-01-25 MED ORDER — SODIUM CHLORIDE 0.9 % IV SOLN
152.0000 mg | Freq: Once | INTRAVENOUS | Status: AC
  Administered 2017-01-25: 150 mg via INTRAVENOUS
  Filled 2017-01-25: qty 15

## 2017-01-25 MED ORDER — SODIUM CHLORIDE 0.9 % IV SOLN
Freq: Once | INTRAVENOUS | Status: AC
  Administered 2017-01-25: 09:00:00 via INTRAVENOUS

## 2017-01-25 MED ORDER — HEPARIN SOD (PORK) LOCK FLUSH 100 UNIT/ML IV SOLN
500.0000 [IU] | Freq: Once | INTRAVENOUS | Status: DC | PRN
  Filled 2017-01-25: qty 5

## 2017-01-25 MED ORDER — ACETAMINOPHEN 325 MG PO TABS
ORAL_TABLET | ORAL | Status: AC
  Filled 2017-01-25: qty 2

## 2017-01-25 MED ORDER — SODIUM CHLORIDE 0.9% FLUSH
10.0000 mL | INTRAVENOUS | Status: AC | PRN
  Administered 2017-01-25: 10 mL
  Filled 2017-01-25: qty 10

## 2017-01-25 MED ORDER — PALONOSETRON HCL INJECTION 0.25 MG/5ML
INTRAVENOUS | Status: AC
  Filled 2017-01-25: qty 5

## 2017-01-25 MED ORDER — PACLITAXEL CHEMO INJECTION 300 MG/50ML
45.0000 mg/m2 | Freq: Once | INTRAVENOUS | Status: AC
  Administered 2017-01-25: 72 mg via INTRAVENOUS
  Filled 2017-01-25: qty 12

## 2017-01-25 MED ORDER — ACETAMINOPHEN 325 MG PO TABS
650.0000 mg | ORAL_TABLET | Freq: Once | ORAL | Status: AC
  Administered 2017-01-25: 650 mg via ORAL

## 2017-01-25 MED ORDER — DARBEPOETIN ALFA 500 MCG/ML IJ SOSY
500.0000 ug | PREFILLED_SYRINGE | Freq: Once | INTRAMUSCULAR | Status: AC
  Administered 2017-01-25: 500 ug via SUBCUTANEOUS
  Filled 2017-01-25: qty 1

## 2017-01-25 MED ORDER — SODIUM CHLORIDE 0.9 % IV SOLN
250.0000 mL | Freq: Once | INTRAVENOUS | Status: AC
  Administered 2017-01-25: 250 mL via INTRAVENOUS

## 2017-01-25 MED ORDER — PALONOSETRON HCL INJECTION 0.25 MG/5ML
0.2500 mg | Freq: Once | INTRAVENOUS | Status: AC
  Administered 2017-01-25: 0.25 mg via INTRAVENOUS

## 2017-01-25 MED ORDER — SODIUM CHLORIDE 0.9 % IV SOLN
20.0000 mg | Freq: Once | INTRAVENOUS | Status: AC
  Administered 2017-01-25: 20 mg via INTRAVENOUS
  Filled 2017-01-25: qty 2

## 2017-01-25 MED ORDER — DIPHENHYDRAMINE HCL 50 MG/ML IJ SOLN
50.0000 mg | Freq: Once | INTRAMUSCULAR | Status: AC
  Administered 2017-01-25: 50 mg via INTRAVENOUS

## 2017-01-25 MED ORDER — FAMOTIDINE IN NACL 20-0.9 MG/50ML-% IV SOLN
20.0000 mg | Freq: Once | INTRAVENOUS | Status: AC
  Administered 2017-01-25: 20 mg via INTRAVENOUS

## 2017-01-25 MED ORDER — FAMOTIDINE IN NACL 20-0.9 MG/50ML-% IV SOLN
INTRAVENOUS | Status: AC
  Filled 2017-01-25: qty 50

## 2017-01-25 MED ORDER — HEPARIN SOD (PORK) LOCK FLUSH 100 UNIT/ML IV SOLN
500.0000 [IU] | Freq: Every day | INTRAVENOUS | Status: AC | PRN
  Administered 2017-01-25: 500 [IU]
  Filled 2017-01-25: qty 5

## 2017-01-25 MED ORDER — DIPHENHYDRAMINE HCL 50 MG/ML IJ SOLN
INTRAMUSCULAR | Status: AC
  Filled 2017-01-25: qty 1

## 2017-01-25 MED ORDER — DIPHENHYDRAMINE HCL 25 MG PO CAPS
ORAL_CAPSULE | ORAL | Status: AC
  Filled 2017-01-25: qty 1

## 2017-01-25 MED ORDER — SODIUM CHLORIDE 0.9% FLUSH
10.0000 mL | INTRAVENOUS | Status: DC | PRN
  Filled 2017-01-25: qty 10

## 2017-01-25 MED ORDER — DIPHENHYDRAMINE HCL 25 MG PO CAPS
25.0000 mg | ORAL_CAPSULE | Freq: Once | ORAL | Status: AC
  Administered 2017-01-25: 25 mg via ORAL

## 2017-01-25 NOTE — Patient Instructions (Signed)
[facility] Discharge Instructions for Patients Receiving Chemotherapy  Today you received the following chemotherapy agents :  Taxol, Carboplatin.  To help prevent nausea and vomiting after your treatment, we encourage you to take your nausea medication as prescribed.   If you develop nausea and vomiting that is not controlled by your nausea medication, call the clinic.   BELOW ARE SYMPTOMS THAT SHOULD BE REPORTED IMMEDIATELY:  *FEVER GREATER THAN 100.5 F  *CHILLS WITH OR WITHOUT FEVER  NAUSEA AND VOMITING THAT IS NOT CONTROLLED WITH YOUR NAUSEA MEDICATION  *UNUSUAL SHORTNESS OF BREATH  *UNUSUAL BRUISING OR BLEEDING  TENDERNESS IN MOUTH AND THROAT WITH OR WITHOUT PRESENCE OF ULCERS  *URINARY PROBLEMS  *BOWEL PROBLEMS  UNUSUAL RASH Items with * indicate a potential emergency and should be followed up as soon as possible.  Feel free to call the clinic you have any questions or concerns. The clinic phone number is [phone].  Please show the [facility] at check-in to the Emergency Department and triage nurse.    Blood Transfusion, Adult A blood transfusion is a procedure in which you receive donated blood, including plasma, platelets, and red blood cells, through an IV tube. You may need a blood transfusion because of illness, surgery, or injury. The blood may come from a donor. You may also be able to donate blood for yourself (autologous blood donation) before a surgery if you know that you might require a blood transfusion. The blood given in a transfusion is made up of different types of cells. You may receive:  Red blood cells. These carry oxygen to the cells in the body.  White blood cells. These help you fight infections.  Platelets. These help your blood to clot.  Plasma. This is the liquid part of your blood and it helps with fluid imbalances.  If you have hemophilia or another clotting disorder, you may also receive other types of blood  products. Tell a health care provider about:  Any allergies you have.  All medicines you are taking, including vitamins, herbs, eye drops, creams, and over-the-counter medicines.  Any problems you or family members have had with anesthetic medicines.  Any blood disorders you have.  Any surgeries you have had.  Any medical conditions you have, including any recent fever or cold symptoms.  Whether you are pregnant or may be pregnant.  Any previous reactions you have had during a blood transfusion. What are the risks? Generally, this is a safe procedure. However, problems may occur, including:  Having an allergic reaction to something in the donated blood. Hives and itching may be symptoms of this type of reaction.  Fever. This may be a reaction to the white blood cells in the transfused blood. Nausea or chest pain may accompany a fever.  Iron overload. This can happen from having many transfusions.  Transfusion-related acute lung injury (TRALI). This is a rare reaction that causes lung damage. The cause is not known.TRALI can occur within hours of a transfusion or several days later.  Sudden (acute) or delayed hemolytic reactions. This happens if your blood does not match the cells in your transfusion. Your body's defense system (immune system) may try to attack the new cells. This complication is rare. The symptoms include fever, chills, nausea, and low back pain or chest pain.  Infection or disease transmission. This is rare.  What happens before the procedure?  You will have a blood test to determine your blood type. This is necessary to know  what kind of blood your body will accept and to match it to the donor blood.  If you are going to have a planned surgery, you may be able to do an autologous blood donation. This may be done in case you need to have a transfusion.  If you have had an allergic reaction to a transfusion in the past, you may be given medicine to help  prevent a reaction. This medicine may be given to you by mouth or through an IV tube.  You will have your temperature, blood pressure, and pulse monitored before the transfusion.  Follow instructions from your health care provider about eating and drinking restrictions.  Ask your health care provider about: ? Changing or stopping your regular medicines. This is especially important if you are taking diabetes medicines or blood thinners. ? Taking medicines such as aspirin and ibuprofen. These medicines can thin your blood. Do not take these medicines before your procedure if your health care provider instructs you not to. What happens during the procedure?  An IV tube will be inserted into one of your veins.  The bag of donated blood will be attached to your IV tube. The blood will then enter through your vein.  Your temperature, blood pressure, and pulse will be monitored regularly during the transfusion. This monitoring is done to detect early signs of a transfusion reaction.  If you have any signs or symptoms of a reaction, your transfusion will be stopped and you may be given medicine.  When the transfusion is complete, your IV tube will be removed.  Pressure may be applied to the IV site for a few minutes.  A bandage (dressing) will be applied. The procedure may vary among health care providers and hospitals. What happens after the procedure?  Your temperature, blood pressure, heart rate, breathing rate, and blood oxygen level will be monitored often.  Your blood may be tested to see how you are responding to the transfusion.  You may be warmed with fluids or blankets to maintain a normal body temperature. Summary  A blood transfusion is a procedure in which you receive donated blood, including plasma, platelets, and red blood cells, through an IV tube.  Your temperature, blood pressure, and pulse will be monitored before, during, and after the transfusion.  Your blood may  be tested after the transfusion to see how your body has responded. This information is not intended to replace advice given to you by your health care provider. Make sure you discuss any questions you have with your health care provider. Document Released: 04/22/2000 Document Revised: 01/21/2016 Document Reviewed: 01/21/2016 Elsevier Interactive Patient Education  [employer].

## 2017-01-26 LAB — TYPE AND SCREEN
ABO/RH(D): O POS
Antibody Screen: NEGATIVE
Donor AG Type: NEGATIVE
Unit division: 0

## 2017-01-26 LAB — BPAM RBC
BLOOD PRODUCT EXPIRATION DATE: 201810142359
ISSUE DATE / TIME: 201809191258
Unit Type and Rh: 5100

## 2017-02-01 ENCOUNTER — Telehealth: Payer: Self-pay | Admitting: Hematology and Oncology

## 2017-02-01 ENCOUNTER — Encounter: Payer: Self-pay | Admitting: Hematology and Oncology

## 2017-02-01 ENCOUNTER — Other Ambulatory Visit (HOSPITAL_BASED_OUTPATIENT_CLINIC_OR_DEPARTMENT_OTHER): Payer: Medicare Other

## 2017-02-01 ENCOUNTER — Ambulatory Visit (HOSPITAL_BASED_OUTPATIENT_CLINIC_OR_DEPARTMENT_OTHER): Payer: Medicare Other | Admitting: Hematology and Oncology

## 2017-02-01 ENCOUNTER — Other Ambulatory Visit: Payer: Self-pay | Admitting: Hematology and Oncology

## 2017-02-01 ENCOUNTER — Ambulatory Visit (HOSPITAL_BASED_OUTPATIENT_CLINIC_OR_DEPARTMENT_OTHER): Payer: Medicare Other

## 2017-02-01 ENCOUNTER — Ambulatory Visit: Payer: Medicare Other

## 2017-02-01 VITALS — BP 157/80 | HR 65 | Temp 97.5°F | Resp 17

## 2017-02-01 DIAGNOSIS — Z23 Encounter for immunization: Secondary | ICD-10-CM

## 2017-02-01 DIAGNOSIS — D696 Thrombocytopenia, unspecified: Secondary | ICD-10-CM

## 2017-02-01 DIAGNOSIS — C801 Malignant (primary) neoplasm, unspecified: Secondary | ICD-10-CM

## 2017-02-01 DIAGNOSIS — C3412 Malignant neoplasm of upper lobe, left bronchus or lung: Secondary | ICD-10-CM

## 2017-02-01 DIAGNOSIS — J449 Chronic obstructive pulmonary disease, unspecified: Secondary | ICD-10-CM

## 2017-02-01 DIAGNOSIS — D4620 Refractory anemia with excess of blasts, unspecified: Secondary | ICD-10-CM

## 2017-02-01 DIAGNOSIS — C7802 Secondary malignant neoplasm of left lung: Secondary | ICD-10-CM

## 2017-02-01 DIAGNOSIS — Z5111 Encounter for antineoplastic chemotherapy: Secondary | ICD-10-CM

## 2017-02-01 LAB — COMPREHENSIVE METABOLIC PANEL
ALBUMIN: 3.2 g/dL — AB (ref 3.5–5.0)
ALK PHOS: 88 U/L (ref 40–150)
AST: 10 U/L (ref 5–34)
Anion Gap: 6 mEq/L (ref 3–11)
BILIRUBIN TOTAL: 0.46 mg/dL (ref 0.20–1.20)
BUN: 23 mg/dL (ref 7.0–26.0)
CALCIUM: 9 mg/dL (ref 8.4–10.4)
CHLORIDE: 114 meq/L — AB (ref 98–109)
CO2: 20 mEq/L — ABNORMAL LOW (ref 22–29)
CREATININE: 0.8 mg/dL (ref 0.7–1.3)
EGFR: >90 mL/min/{1.73_m2} (ref >90)
Glucose: 82 mg/dl (ref 70–140)
Potassium: 4.8 mEq/L (ref 3.5–5.1)
Sodium: 141 mEq/L (ref 136–145)
TOTAL PROTEIN: 7.9 g/dL (ref 6.4–8.3)

## 2017-02-01 LAB — CBC WITH DIFFERENTIAL/PLATELET
BASO%: 0.9 % (ref 0.0–2.0)
Basophils Absolute: 0.1 10*3/uL (ref 0.0–0.1)
EOS ABS: 0 10*3/uL (ref 0.0–0.5)
EOS%: 0.1 % (ref 0.0–7.0)
HCT: 25 % — ABNORMAL LOW (ref 38.4–49.9)
HGB: 8.2 g/dL — ABNORMAL LOW (ref 13.0–17.1)
LYMPH%: 7.6 % — AB (ref 14.0–49.0)
MCH: 30.8 pg (ref 27.2–33.4)
MCHC: 32.9 g/dL (ref 32.0–36.0)
MCV: 93.4 fL (ref 79.3–98.0)
MONO#: 0.4 10*3/uL (ref 0.1–0.9)
MONO%: 5 % (ref 0.0–14.0)
NEUT%: 86.4 % — ABNORMAL HIGH (ref 39.0–75.0)
NEUTROS ABS: 6.9 10*3/uL — AB (ref 1.5–6.5)
Platelets: 104 10*3/uL — ABNORMAL LOW (ref 140–400)
RBC: 2.67 10*6/uL — AB (ref 4.20–5.82)
RDW: 28.6 % — ABNORMAL HIGH (ref 11.0–14.6)
WBC: 8 10*3/uL (ref 4.0–10.3)
lymph#: 0.6 10*3/uL — ABNORMAL LOW (ref 0.9–3.3)

## 2017-02-01 LAB — PREPARE RBC (CROSSMATCH)

## 2017-02-01 MED ORDER — SODIUM CHLORIDE 0.9% FLUSH
10.0000 mL | INTRAVENOUS | Status: AC | PRN
Start: 2017-02-01 — End: 2017-02-01
  Administered 2017-02-01: 10 mL
  Filled 2017-02-01: qty 10

## 2017-02-01 MED ORDER — SODIUM CHLORIDE 0.9% FLUSH
10.0000 mL | INTRAVENOUS | Status: DC | PRN
  Filled 2017-02-01: qty 10

## 2017-02-01 MED ORDER — PALONOSETRON HCL INJECTION 0.25 MG/5ML
INTRAVENOUS | Status: AC
  Filled 2017-02-01: qty 5

## 2017-02-01 MED ORDER — DIPHENHYDRAMINE HCL 25 MG PO CAPS: 25.0000 mg | ORAL_CAPSULE | Freq: Once | ORAL | Status: DC

## 2017-02-01 MED ORDER — PREDNISONE 10 MG PO TABS: 10.0000 mg | ORAL_TABLET | Freq: Every day | ORAL | 1 refills | Status: DC

## 2017-02-01 MED ORDER — PALONOSETRON HCL INJECTION 0.25 MG/5ML
0.2500 mg | Freq: Once | INTRAVENOUS | Status: AC
  Administered 2017-02-01: 0.25 mg via INTRAVENOUS

## 2017-02-01 MED ORDER — SODIUM CHLORIDE 0.9 % IV SOLN
152.0000 mg | Freq: Once | INTRAVENOUS | Status: AC
  Administered 2017-02-01: 150 mg via INTRAVENOUS
  Filled 2017-02-01: qty 15

## 2017-02-01 MED ORDER — SODIUM CHLORIDE 0.9% FLUSH
3.0000 mL | INTRAVENOUS | Status: DC | PRN
Start: 2017-02-01 — End: 2017-02-01
  Filled 2017-02-01: qty 10

## 2017-02-01 MED ORDER — DIPHENHYDRAMINE HCL 50 MG/ML IJ SOLN
50.0000 mg | Freq: Once | INTRAMUSCULAR | Status: AC
  Administered 2017-02-01: 50 mg via INTRAVENOUS

## 2017-02-01 MED ORDER — PACLITAXEL CHEMO INJECTION 300 MG/50ML
45.0000 mg/m2 | Freq: Once | INTRAVENOUS | Status: AC
  Administered 2017-02-01: 72 mg via INTRAVENOUS
  Filled 2017-02-01: qty 12

## 2017-02-01 MED ORDER — HEPARIN SOD (PORK) LOCK FLUSH 100 UNIT/ML IV SOLN
500.0000 [IU] | Freq: Once | INTRAVENOUS | Status: DC | PRN
  Filled 2017-02-01: qty 5

## 2017-02-01 MED ORDER — DIPHENHYDRAMINE HCL 50 MG/ML IJ SOLN
INTRAMUSCULAR | Status: AC
  Filled 2017-02-01: qty 1

## 2017-02-01 MED ORDER — INFLUENZA VAC SPLIT QUAD 0.5 ML IM SUSY
0.5000 mL | PREFILLED_SYRINGE | Freq: Once | INTRAMUSCULAR | Status: AC
  Administered 2017-02-01: 0.5 mL via INTRAMUSCULAR
  Filled 2017-02-01: qty 0.5

## 2017-02-01 MED ORDER — ACETAMINOPHEN 325 MG PO TABS
ORAL_TABLET | ORAL | Status: AC
  Filled 2017-02-01: qty 2

## 2017-02-01 MED ORDER — SODIUM CHLORIDE 0.9 % IV SOLN
Freq: Once | INTRAVENOUS | Status: AC
  Administered 2017-02-01: 11:00:00 via INTRAVENOUS

## 2017-02-01 MED ORDER — SODIUM CHLORIDE 0.9 % IV SOLN
250.0000 mL | Freq: Once | INTRAVENOUS | Status: AC
  Administered 2017-02-01: 250 mL via INTRAVENOUS

## 2017-02-01 MED ORDER — ACETAMINOPHEN 325 MG PO TABS
650.0000 mg | ORAL_TABLET | Freq: Once | ORAL | Status: AC
  Administered 2017-02-01: 650 mg via ORAL

## 2017-02-01 MED ORDER — HEPARIN SOD (PORK) LOCK FLUSH 100 UNIT/ML IV SOLN
500.0000 [IU] | Freq: Every day | INTRAVENOUS | Status: AC | PRN
  Administered 2017-02-01: 500 [IU]
  Filled 2017-02-01: qty 5

## 2017-02-01 MED ORDER — FAMOTIDINE IN NACL 20-0.9 MG/50ML-% IV SOLN
20.0000 mg | Freq: Once | INTRAVENOUS | Status: AC
  Administered 2017-02-01: 20 mg via INTRAVENOUS

## 2017-02-01 MED ORDER — HEPARIN SOD (PORK) LOCK FLUSH 100 UNIT/ML IV SOLN
250.0000 [IU] | INTRAVENOUS | Status: DC | PRN
  Filled 2017-02-01: qty 5

## 2017-02-01 MED ORDER — FAMOTIDINE IN NACL 20-0.9 MG/50ML-% IV SOLN
INTRAVENOUS | Status: AC
  Filled 2017-02-01: qty 50

## 2017-02-01 MED ORDER — SODIUM CHLORIDE 0.9 % IV SOLN
20.0000 mg | Freq: Once | INTRAVENOUS | Status: AC
  Administered 2017-02-01: 20 mg via INTRAVENOUS
  Filled 2017-02-01: qty 2

## 2017-02-01 NOTE — Assessment & Plan Note (Signed)
He had responded well to Darbopeitin in the past with improvement in energy level.  The patient will get regular blood work done. The patient will continue on Aranep injection to keep hemoglobin greater than 10 g. He will continue to get darbepoetin injection while on treatment, scheduled to be given every 2 weeks. He had recently received blood transfusion with symptomatic improvement.   Today, he is mildly symptomatic. We discussed some of the risks, benefits, and alternatives of blood transfusions. The patient is symptomatic from anemia and the hemoglobin level is critically low.  Some of the side-effects to be expected including risks of transfusion reactions, chills, infection, syndrome of volume overload and risk of hospitalization from various reasons and the patient is willing to proceed and went ahead to sign consent today. I plan to give him a unit of blood after chemo today

## 2017-02-01 NOTE — Patient Instructions (Signed)
[facility] Discharge Instructions for Patients Receiving Chemotherapy  Today you received the following chemotherapy agents Carboplatin and Taxol  To help prevent nausea and vomiting after your treatment, we encourage you to take your nausea medication as directed  If you develop nausea and vomiting that is not controlled by your nausea medication, call the clinic.   BELOW ARE SYMPTOMS THAT SHOULD BE REPORTED IMMEDIATELY:  *FEVER GREATER THAN 100.5 F  *CHILLS WITH OR WITHOUT FEVER  NAUSEA AND VOMITING THAT IS NOT CONTROLLED WITH YOUR NAUSEA MEDICATION  *UNUSUAL SHORTNESS OF BREATH  *UNUSUAL BRUISING OR BLEEDING  TENDERNESS IN MOUTH AND THROAT WITH OR WITHOUT PRESENCE OF ULCERS  *URINARY PROBLEMS  *BOWEL PROBLEMS  UNUSUAL RASH Items with * indicate a potential emergency and should be followed up as soon as possible.  Feel free to call the clinic should you have any questions or concerns. The clinic phone number is [phone].  Please show the [facility] at check-in to the Emergency Department and triage nurse.    Blood Transfusion, Adult A blood transfusion is a procedure in which you receive donated blood, including plasma, platelets, and red blood cells, through an IV tube. You may need a blood transfusion because of illness, surgery, or injury. The blood may come from a donor. You may also be able to donate blood for yourself (autologous blood donation) before a surgery if you know that you might require a blood transfusion. The blood given in a transfusion is made up of different types of cells. You may receive:  Red blood cells. These carry oxygen to the cells in the body.  White blood cells. These help you fight infections.  Platelets. These help your blood to clot.  Plasma. This is the liquid part of your blood and it helps with fluid imbalances.  If you have hemophilia or another clotting disorder, you may also receive other types of  blood products. Tell a health care provider about:  Any allergies you have.  All medicines you are taking, including vitamins, herbs, eye drops, creams, and over-the-counter medicines.  Any problems you or family members have had with anesthetic medicines.  Any blood disorders you have.  Any surgeries you have had.  Any medical conditions you have, including any recent fever or cold symptoms.  Whether you are pregnant or may be pregnant.  Any previous reactions you have had during a blood transfusion. What are the risks? Generally, this is a safe procedure. However, problems may occur, including:  Having an allergic reaction to something in the donated blood. Hives and itching may be symptoms of this type of reaction.  Fever. This may be a reaction to the white blood cells in the transfused blood. Nausea or chest pain may accompany a fever.  Iron overload. This can happen from having many transfusions.  Transfusion-related acute lung injury (TRALI). This is a rare reaction that causes lung damage. The cause is not known.TRALI can occur within hours of a transfusion or several days later.  Sudden (acute) or delayed hemolytic reactions. This happens if your blood does not match the cells in your transfusion. Your body's defense system (immune system) may try to attack the new cells. This complication is rare. The symptoms include fever, chills, nausea, and low back pain or chest pain.  Infection or disease transmission. This is rare.  What happens before the procedure?  You will have a blood test to determine your blood type. This is necessary to know what  kind of blood your body will accept and to match it to the donor blood.  If you are going to have a planned surgery, you may be able to do an autologous blood donation. This may be done in case you need to have a transfusion.  If you have had an allergic reaction to a transfusion in the past, you may be given medicine to help  prevent a reaction. This medicine may be given to you by mouth or through an IV tube.  You will have your temperature, blood pressure, and pulse monitored before the transfusion.  Follow instructions from your health care provider about eating and drinking restrictions.  Ask your health care provider about: ? Changing or stopping your regular medicines. This is especially important if you are taking diabetes medicines or blood thinners. ? Taking medicines such as aspirin and ibuprofen. These medicines can thin your blood. Do not take these medicines before your procedure if your health care provider instructs you not to. What happens during the procedure?  An IV tube will be inserted into one of your veins.  The bag of donated blood will be attached to your IV tube. The blood will then enter through your vein.  Your temperature, blood pressure, and pulse will be monitored regularly during the transfusion. This monitoring is done to detect early signs of a transfusion reaction.  If you have any signs or symptoms of a reaction, your transfusion will be stopped and you may be given medicine.  When the transfusion is complete, your IV tube will be removed.  Pressure may be applied to the IV site for a few minutes.  A bandage (dressing) will be applied. The procedure may vary among health care providers and hospitals. What happens after the procedure?  Your temperature, blood pressure, heart rate, breathing rate, and blood oxygen level will be monitored often.  Your blood may be tested to see how you are responding to the transfusion.  You may be warmed with fluids or blankets to maintain a normal body temperature. Summary  A blood transfusion is a procedure in which you receive donated blood, including plasma, platelets, and red blood cells, through an IV tube.  Your temperature, blood pressure, and pulse will be monitored before, during, and after the transfusion.  Your blood may  be tested after the transfusion to see how your body has responded. This information is not intended to replace advice given to you by your health care provider. Make sure you discuss any questions you have with your health care provider. Document Released: 04/22/2000 Document Revised: 01/21/2016 Document Reviewed: 01/21/2016 Elsevier Interactive Patient Education  [employer].

## 2017-02-01 NOTE — Assessment & Plan Note (Signed)
Recent PET CT scan on 11/29/16 showed positive response to treatment I recommend we continue the same dose of chemotherapy, carboplatin and Taxol on days 1 and 8 and rest day 15 He will get Neulasta support after day 8 treatment I plan to see him back in a few weeks for further supportive care and I plan to repeat imaging study again in October

## 2017-02-01 NOTE — Telephone Encounter (Signed)
Gave patient AVS and calendar of upcoming October appointments °

## 2017-02-01 NOTE — Assessment & Plan Note (Signed)
He has mild recent COPD exacerbation I recommend increasing prednisone therapy to 20 mg a day for 1 week and then reduce back to 5 mg daily He does not need antibiotic treatment

## 2017-02-01 NOTE — Progress Notes (Signed)
[facility] OFFICE PROGRESS NOTE  Patient Care Team: [name], MD as PCP - General (Family Medicine) [name], MD as Consulting Physician (Hematology and Oncology)  SUMMARY OF ONCOLOGIC HISTORY: Oncology History   MDS, R-IPSS score of 3, low risk (Hg 8.9, +16 chromosome on BM, 2% blast count)   Squamous cell carcinoma of the epiglottis   Primary site: Larynx - Supraglottis (Left)   Staging method: AJCC 7th Edition   Clinical: Stage I (T1, N0, M0) signed by [name], MD on 04/30/2013 12:49 PM   Pathologic: Stage I (T1, N0, cM0) signed by [name], MD on 04/30/2013 12:49 PM   Summary: Stage I (T1, N0, cM0)       MDS (myelodysplastic syndrome), low grade ([place])   02/01/2007 Bone Marrow Biopsy    BM biopsy was abnormal, overall probable low grade MDS      03/23/2011 - 02/06/2013 Chemotherapy    [name], discontinued due to diagnosis of laryngeal ca      09/06/2013 Bone Marrow Biopsy    Repeat bone marrow aspirate and biopsy confirmed low-grade myelodysplastic syndrome.      09/17/2013 -  Chemotherapy    The patient resumed darbepoetin injections to treat the anemia.       History of head and neck cancer   03/06/2013 Procedure    Biopsy from epiglottic region showed invasive [facility]      04/25/2013 Imaging    Ct scan showed no other involvement in the LN      05/13/2013 - 06/28/2013 Radiation Therapy    He received radiation therapy, 70 gray in 35 fractions to the larynx      06/09/2016 Imaging    CT scan of the chest showed Bilateral spiculated soft tissue pulmonary masses, highly suspicious for multifocal malignancy. Borderline enlarged left-sided mediastinal lymphadenopathy. Background of severe emphysematous changes, chronic cylindrical bronchiectasis and hyperinflation of the lungs. Findings consistent with longstanding COPD. Diffusely thickened interstitial markings. This may represent chronic interstitial lung changes, however  lymphangitic spread of malignancy cannot be excluded. Anterior compression deformities of several of the thoracic vertebral bodies, with heterogeneous appearance of T7 and T9 vertebral body. This may represent degenerative changes, however osseous metastatic disease cannot be excluded.       07/11/2016 PET scan    There are 2 nodules in the left upper lobe and 2 nodules within the right lower lobe which have spiculated margin and exhibit intense radiotracer uptake compatible with multifocal pulmonary metastasis versus multiple synchronous primary pulmonary neoplasms. 2. Emphysema 3. Aortic atherosclerosis and multi vessel coronary artery calcification.       Cancer of upper lobe of left lung ([place])   06/09/2016 Imaging    CT chest: Bilateral spiculated soft tissue pulmonary masses, highly suspicious for multifocal malignancy. Borderline enlarged left-sided mediastinal lymphadenopathy. Background of severe emphysematous changes, chronic cylindrical bronchiectasis and hyperinflation of the lungs. Findings consistent with longstanding COPD. Diffusely thickened interstitial markings. This may represent chronic interstitial lung changes, however lymphangitic spread of malignancy cannot be excluded. Anterior compression deformities of several of the thoracic vertebral bodies, with heterogeneous appearance of T7 and T9 vertebral body. This may represent degenerative changes, however osseous metastatic disease cannot be excluded.       06/29/2016 Imaging    MRI brain: No evidence of metastatic disease or recent infarction. Advanced generalized brain atrophy with moderate to marked chronic small-vessel ischemic changes throughout. FLAIR imaging appears to show scattered gyri showing FLAIR hyperintensity. These abnormalities are not  confirmed with restricted diffusion or contrast enhancement. Therefore, we are not certain that this is not artifactual. The differential diagnosis does include true pathology such  as posterior reversible encephalopathy, viral or prion disease, postictal change in post chemotherapy change. If there is concern about active CNS pathology, re- scanning in 4-6 weeks could be useful.      07/27/2016 Imaging    CT chest: Bilateral pulmonary lesions. These are suspicious for metastatic disease. Lesions have minimally changed but there may be slight enlargement of the cavitary lesion in the right lower lobe. Severe emphysematous changes.       08/10/2016 Pathology Results    1. Lung, biopsy, RUL, (apical segment) - SCANT BENIGN LUNG PARENCHYMA. - THERE IS NO EVIDENCE OF MALIGNANCY. 2. Lung, biopsy, LUL lingula - SQUAMOUS CELL CARCINOMA. - SEE COMMENT. 3. Lung, biopsy, LUL - ADENOCARCINOMA. - SEE COMMENT. Microscopic Comment 2. The malignant cells are positive for cytokeratin 56 and p63. They are negative for TTF-1. The findings are consistent with squamous cell carcinoma. There is likely insufficient tissue remaining for additional studies, if requested. 3. The malignant cells are positive for TTF-1 and negative for p63 and cytokeratin 5/6. The findings are consistent with adenocarcinoma.      08/10/2016 Procedure    The targets were defined as follows: Left upper lobe nodule, target 1 Lingular nodule, target 2 Proximal right lower lobe nodule, target 3 More distal right lower lobe, nodule  The extendable working channel was secured into place and the locator guide was withdrawn. Under fluoroscopic guidance transbronchial needle brushings, transbronchial Wang needle biopsies, and transbronchial forceps biopsies were performed in the LUL (target 1) and the lingula (target 2) to be sent for cytology and pathology. A bronchioalveolar lavage was performed in the lingula in the vicinity of target 2  and sent for cytology and microbiology (bacterial, fungal, AFB smears and cultures).  Fiducial markers were then placed around target 1, target 2, and in the right lower lobe in the  vicinity of both target 3 and target 4 to be used for possible radiation therapy should this be indicated. At the end of the procedure a general airway inspection was performed and there was no evidence of active bleeding. The bronchoscope was removed.  The patient tolerated the procedure well. There was no significant blood loss and there were no obvious complications. A post-procedural chest x-ray is pending.  Samples: 1. Transbronchial needle brushings from targets 1 and 2 2. Transbronchial Wang needle biopsies from targets 1 and 2 3. Transbronchial forceps biopsies from targets 1 and 2 4. Bronchoalveolar lavage from lingula, target 2 5. Endobronchial biopsies from RUL apical segment 6. Endobronchial brushings from right upper lobe apical segment       08/10/2016 Genetic Testing    Patient has genetic testing done for [employer] and PD-L1 testing Results revealed PD-L1 testing is negative 0%      08/31/2016 Procedure    Technically successful right IJ power-injectable port catheter placement. Ready for routine use      11/30/2016 PET scan    1. Interval resection of the hypermetabolic lingular nodule. 2. The 3 other spiculated, hypermetabolic nodules within the left upper and right lower lobes are slightly smaller with decreased metabolic activity, consistent with some response to interval therapy. 3. No progressive disease       INTERVAL HISTORY: Please see below for problem oriented charting. He returns for further follow-up and chemotherapy He complain of mild fatigue He had recent mild congestion and  cough No fever or chills No hemoptysis Denies peripheral neuropathy His appetite is stable and he has not lost any weight No recent nausea vomiting  REVIEW OF SYSTEMS:   Constitutional: Denies fevers, chills or abnormal weight loss Eyes: Denies blurriness of vision Ears, nose, mouth, throat, and face: Denies mucositis or sore throat Cardiovascular: Denies palpitation,  chest discomfort or lower extremity swelling Gastrointestinal:  Denies nausea, heartburn or change in bowel habits Skin: Denies abnormal skin rashes Lymphatics: Denies new lymphadenopathy or easy bruising Neurological:Denies numbness, tingling or new weaknesses Behavioral/Psych: Mood is stable, no new changes  All other systems were reviewed with the patient and are negative.  I have reviewed the past medical history, past surgical history, social history and family history with the patient and they are unchanged from previous note.  ALLERGIES:  is allergic to no known allergies.  MEDICATIONS:  Current Outpatient Prescriptions  Medication Sig Dispense Refill  . albuterol (PROVENTIL HFA;VENTOLIN HFA) 108 (90 Base) MCG/ACT inhaler Inhale 2 puffs into the lungs every 4 (four) hours as needed for wheezing or shortness of breath. 1 Inhaler 5  . cyanocobalamin 500 MCG tablet Take 500 mcg by mouth daily.    . ferrous sulfate 325 (65 FE) MG tablet Take 325 mg by mouth daily with breakfast.    . folic acid (FOLVITE) 1 MG tablet Take 1 mg by mouth daily.    . Glycopyrrolate-Formoterol (BEVESPI AEROSPHERE) 9-4.8 MCG/ACT AERO Inhale 2 puffs into the lungs 2 (two) times daily. 2 Inhaler 0  . levETIRAcetam (KEPPRA) 500 MG tablet Take 1 tablet (500 mg total) by mouth 2 (two) times daily. 60 tablet 3  . lidocaine-prilocaine (EMLA) cream Apply to affected area once 30 g 3  . ondansetron (ZOFRAN) 8 MG tablet Take 1 tablet (8 mg total) by mouth 2 (two) times daily as needed for refractory nausea / vomiting. Start on day 3 after chemo. 30 tablet 1  . predniSONE (DELTASONE) 10 MG tablet Take 1 tablet (10 mg total) by mouth daily with breakfast. 60 tablet 1  . prochlorperazine (COMPAZINE) 10 MG tablet Take 10 mg by mouth every 6 (six) hours as needed.    . vitamin E 1000 UNIT capsule Take 1,000 Units by mouth daily.      No current facility-administered medications for this visit.     PHYSICAL  EXAMINATION: ECOG PERFORMANCE STATUS: 1 - Symptomatic but completely ambulatory  Vitals:   02/01/17 0958  BP: 130/74  Pulse: 78  Resp: 20  Temp: 97.7 F (36.5 C)  SpO2: 96%   Filed Weights   02/01/17 0958  Weight: 109 lb 11.2 oz (49.8 kg)    GENERAL:alert, no distress and comfortable.  He looks thin and cachectic  SKIN: skin color, texture, turgor are normal, no rashes or significant lesions EYES: normal, Conjunctiva are pink and non-injected, sclera clear OROPHARYNX:no exudate, no erythema and lips, buccal mucosa, and tongue normal  NECK: supple, thyroid normal size, non-tender, without nodularity LYMPH:  no palpable lymphadenopathy in the cervical, axillary or inguinal LUNGS: clear to auscultation and percussion with normal breathing effort HEART: regular rate & rhythm and no murmurs and no lower extremity edema ABDOMEN:abdomen soft, non-tender and normal bowel sounds Musculoskeletal:no cyanosis of digits with digital clubbing NEURO: alert & oriented x 3 with fluent speech, no focal motor/sensory deficits  LABORATORY DATA:  I have reviewed the data as listed    Component Value Date/Time   NA 140 01/25/2017 0807   K 4.3 01/25/2017 8657  CL 108 08/31/2016 1155   CL 107 09/19/2012 1325   CO2 20 (L) 01/25/2017 0807   GLUCOSE 85 01/25/2017 0807   GLUCOSE 97 09/19/2012 1325   BUN 21.0 01/25/2017 0807   CREATININE 0.9 01/25/2017 0807   CALCIUM 8.9 01/25/2017 0807   PROT 7.7 01/25/2017 0807   ALBUMIN 3.1 (L) 01/25/2017 0807   AST 9 01/25/2017 0807   ALT <3 01/25/2017 0807   ALKPHOS 112 01/25/2017 0807   BILITOT 0.40 01/25/2017 0807   GFRNONAA >60 08/31/2016 1155   GFRAA >60 08/31/2016 1155    No results found for: SPEP, UPEP  Lab Results  Component Value Date   WBC 8.0 02/01/2017   NEUTROABS 6.9 (H) 02/01/2017   HGB 8.2 (L) 02/01/2017   HCT 25.0 (L) 02/01/2017   MCV 93.4 02/01/2017   PLT 104 (L) 02/01/2017      Chemistry      Component Value Date/Time    NA 140 01/25/2017 0807   K 4.3 01/25/2017 0807   CL 108 08/31/2016 1155   CL 107 09/19/2012 1325   CO2 20 (L) 01/25/2017 0807   BUN 21.0 01/25/2017 0807   CREATININE 0.9 01/25/2017 0807      Component Value Date/Time   CALCIUM 8.9 01/25/2017 0807   ALKPHOS 112 01/25/2017 0807   AST 9 01/25/2017 0807   ALT <3 01/25/2017 0807   BILITOT 0.40 01/25/2017 0807      ASSESSMENT & PLAN:  Cancer of upper lobe of left lung ([facility]) Recent PET CT scan on 11/29/16 showed positive response to treatment I recommend we continue the same dose of chemotherapy, carboplatin and Taxol on days 1 and 8 and rest day 15 He will get Neulasta support after day 8 treatment I plan to see him back in a few weeks for further supportive care and I plan to repeat imaging study again in October   MDS (myelodysplastic syndrome), low grade ([place]) He had responded well to Darbopeitin in the past with improvement in energy level.  The patient will get regular blood work done. The patient will continue on Aranep injection to keep hemoglobin greater than 10 g. He will continue to get darbepoetin injection while on treatment, scheduled to be given every 2 weeks. He had recently received blood transfusion with symptomatic improvement.   Today, he is mildly symptomatic. We discussed some of the risks, benefits, and alternatives of blood transfusions. The patient is symptomatic from anemia and the hemoglobin level is critically low.  Some of the side-effects to be expected including risks of transfusion reactions, chills, infection, syndrome of volume overload and risk of hospitalization from various reasons and the patient is willing to proceed and went ahead to sign consent today. I plan to give him a unit of blood after chemo today  Thrombocytopenia ([place]) This is likely due to recent treatment. The patient denies recent history of bleeding such as epistaxis, hematuria or hematochezia. He is asymptomatic from the low platelet  count. I will observe for now.  he does not require transfusion now. I will continue the chemotherapy at current dose without dosage adjustment.  If the thrombocytopenia gets progressive worse in the future, I might have to delay his treatment or adjust the chemotherapy dose.  COPD, mild ([place]) He has mild recent COPD exacerbation I recommend increasing prednisone therapy to 20 mg a day for 1 week and then reduce back to 5 mg daily He does not need antibiotic treatment   No orders of the defined  types were placed in this encounter.  All questions were answered. The patient knows to call the clinic with any problems, questions or concerns. No barriers to learning was detected. I spent 25 minutes counseling the patient face to face. The total time spent in the appointment was 30 minutes and more than 50% was on counseling and review of test results     [name], MD 02/01/2017 10:22 AM

## 2017-02-01 NOTE — Patient Instructions (Signed)

## 2017-02-01 NOTE — Assessment & Plan Note (Signed)
This is likely due to recent treatment. The patient denies recent history of bleeding such as epistaxis, hematuria or hematochezia. He is asymptomatic from the low platelet count. I will observe for now.  he does not require transfusion now. I will continue the chemotherapy at current dose without dosage adjustment.  If the thrombocytopenia gets progressive worse in the future, I might have to delay his treatment or adjust the chemotherapy dose.

## 2017-02-02 LAB — TYPE AND SCREEN
ABO/RH(D): O POS
ANTIBODY SCREEN: NEGATIVE
Donor AG Type: NEGATIVE
Unit division: 0

## 2017-02-02 LAB — BPAM RBC
Blood Product Expiration Date: 201810042359
ISSUE DATE / TIME: 201809261350
UNIT TYPE AND RH: 5100

## 2017-02-03 ENCOUNTER — Ambulatory Visit: Payer: Self-pay

## 2017-02-03 ENCOUNTER — Telehealth: Payer: Self-pay | Admitting: Hematology and Oncology

## 2017-02-03 NOTE — Telephone Encounter (Signed)
Left message for patient regarding updated October schedule.

## 2017-02-06 ENCOUNTER — Ambulatory Visit (HOSPITAL_BASED_OUTPATIENT_CLINIC_OR_DEPARTMENT_OTHER): Payer: Medicare Other

## 2017-02-06 VITALS — BP 122/60 | HR 85 | Temp 97.8°F | Resp 18

## 2017-02-06 DIAGNOSIS — C7802 Secondary malignant neoplasm of left lung: Secondary | ICD-10-CM

## 2017-02-06 DIAGNOSIS — C801 Malignant (primary) neoplasm, unspecified: Secondary | ICD-10-CM

## 2017-02-06 DIAGNOSIS — Z5189 Encounter for other specified aftercare: Secondary | ICD-10-CM

## 2017-02-06 DIAGNOSIS — C3412 Malignant neoplasm of upper lobe, left bronchus or lung: Secondary | ICD-10-CM

## 2017-02-06 MED ORDER — PEGFILGRASTIM INJECTION 6 MG/0.6ML ~~LOC~~
6.0000 mg | PREFILLED_SYRINGE | Freq: Once | SUBCUTANEOUS | Status: AC
  Administered 2017-02-06: 6 mg via SUBCUTANEOUS
  Filled 2017-02-06: qty 0.6

## 2017-02-06 NOTE — Patient Instructions (Signed)
Pegfilgrastim injection What is this medicine? PEGFILGRASTIM (PEG fil gra stim) is a long-acting granulocyte colony-stimulating factor that stimulates the growth of neutrophils, a type of white blood cell important in the body's fight against infection. It is used to reduce the incidence of fever and infection in patients with certain types of cancer who are receiving chemotherapy that affects the bone marrow, and to increase survival after being exposed to high doses of radiation. This medicine may be used for other purposes; ask your health care provider or pharmacist if you have questions. COMMON BRAND NAME(S): Neulasta What should I tell my health care provider before I take this medicine? They need to know if you have any of these conditions: -kidney disease -latex allergy -ongoing radiation therapy -sickle cell disease -skin reactions to acrylic adhesives (On-Body Injector only) -an unusual or allergic reaction to pegfilgrastim, filgrastim, other medicines, foods, dyes, or preservatives -pregnant or trying to get pregnant -breast-feeding How should I use this medicine? This medicine is for injection under the skin. If you get this medicine at home, you will be taught how to prepare and give the pre-filled syringe or how to use the On-body Injector. Refer to the patient Instructions for Use for detailed instructions. Use exactly as directed. Tell your healthcare provider immediately if you suspect that the On-body Injector may not have performed as intended or if you suspect the use of the On-body Injector resulted in a missed or partial dose. It is important that you put your used needles and syringes in a special sharps container. Do not put them in a trash can. If you do not have a sharps container, call your pharmacist or healthcare provider to get one. Talk to your pediatrician regarding the use of this medicine in children. While this drug may be prescribed for selected conditions,  precautions do apply. Overdosage: If you think you have taken too much of this medicine contact a poison control center or emergency room at once. NOTE: This medicine is only for you. Do not share this medicine with others. What if I miss a dose? It is important not to miss your dose. Call your doctor or health care professional if you miss your dose. If you miss a dose due to an On-body Injector failure or leakage, a new dose should be administered as soon as possible using a single prefilled syringe for manual use. What may interact with this medicine? Interactions have not been studied. Give your health care provider a list of all the medicines, herbs, non-prescription drugs, or dietary supplements you use. Also tell them if you smoke, drink alcohol, or use illegal drugs. Some items may interact with your medicine. This list may not describe all possible interactions. Give your health care provider a list of all the medicines, herbs, non-prescription drugs, or dietary supplements you use. Also tell them if you smoke, drink alcohol, or use illegal drugs. Some items may interact with your medicine. What should I watch for while using this medicine? You may need blood work done while you are taking this medicine. If you are going to need a MRI, CT scan, or other procedure, tell your doctor that you are using this medicine (On-Body Injector only). What side effects may I notice from receiving this medicine? Side effects that you should report to your doctor or health care professional as soon as possible: -allergic reactions like skin rash, itching or hives, swelling of the face, lips, or tongue -dizziness -fever -pain, redness, or irritation at site   where injected -pinpoint red spots on the skin -red or dark-brown urine -shortness of breath or breathing problems -stomach or side pain, or pain at the shoulder -swelling -tiredness -trouble passing urine or change in the amount of urine Side  effects that usually do not require medical attention (report to your doctor or health care professional if they continue or are bothersome): -bone pain -muscle pain This list may not describe all possible side effects. Call your doctor for medical advice about side effects. You may report side effects to FDA at 1-800-FDA-1088. Where should I keep my medicine? Keep out of the reach of children. Store pre-filled syringes in a refrigerator between 2 and 8 degrees C (36 and 46 degrees F). Do not freeze. Keep in carton to protect from light. Throw away this medicine if it is left out of the refrigerator for more than 48 hours. Throw away any unused medicine after the expiration date. NOTE: This sheet is a summary. It may not cover all possible information. If you have questions about this medicine, talk to your doctor, pharmacist, or health care provider.  2018 Elsevier/Gold Standard (2016-04-21 12:58:03)  

## 2017-02-11 ENCOUNTER — Other Ambulatory Visit: Payer: Self-pay | Admitting: Hematology and Oncology

## 2017-02-15 ENCOUNTER — Other Ambulatory Visit (HOSPITAL_BASED_OUTPATIENT_CLINIC_OR_DEPARTMENT_OTHER): Payer: Medicare Other

## 2017-02-15 ENCOUNTER — Ambulatory Visit (HOSPITAL_BASED_OUTPATIENT_CLINIC_OR_DEPARTMENT_OTHER): Payer: Medicare Other

## 2017-02-15 VITALS — BP 127/69 | HR 75 | Temp 97.6°F | Resp 22

## 2017-02-15 DIAGNOSIS — C801 Malignant (primary) neoplasm, unspecified: Secondary | ICD-10-CM

## 2017-02-15 DIAGNOSIS — Z5111 Encounter for antineoplastic chemotherapy: Secondary | ICD-10-CM | POA: Diagnosis not present

## 2017-02-15 DIAGNOSIS — C3412 Malignant neoplasm of upper lobe, left bronchus or lung: Secondary | ICD-10-CM | POA: Diagnosis not present

## 2017-02-15 DIAGNOSIS — D4620 Refractory anemia with excess of blasts, unspecified: Secondary | ICD-10-CM

## 2017-02-15 DIAGNOSIS — C7802 Secondary malignant neoplasm of left lung: Secondary | ICD-10-CM

## 2017-02-15 LAB — CBC WITH DIFFERENTIAL/PLATELET
BASO%: 0.2 % (ref 0.0–2.0)
Basophils Absolute: 0.1 10*3/uL (ref 0.0–0.1)
EOS ABS: 0 10*3/uL (ref 0.0–0.5)
EOS%: 0.1 % (ref 0.0–7.0)
HCT: 26.6 % — ABNORMAL LOW (ref 38.4–49.9)
HGB: 8.6 g/dL — ABNORMAL LOW (ref 13.0–17.1)
LYMPH%: 2.6 % — AB (ref 14.0–49.0)
MCH: 30.5 pg (ref 27.2–33.4)
MCHC: 32.3 g/dL (ref 32.0–36.0)
MCV: 94.3 fL (ref 79.3–98.0)
MONO#: 2.3 10*3/uL — ABNORMAL HIGH (ref 0.1–0.9)
MONO%: 6.2 % (ref 0.0–14.0)
NEUT%: 90.9 % — ABNORMAL HIGH (ref 39.0–75.0)
NEUTROS ABS: 33.6 10*3/uL — AB (ref 1.5–6.5)
Platelets: 181 10*3/uL (ref 140–400)
RBC: 2.82 10*6/uL — AB (ref 4.20–5.82)
RDW: 26.6 % — AB (ref 11.0–14.6)
WBC: 36.9 10*3/uL — AB (ref 4.0–10.3)
lymph#: 1 10*3/uL (ref 0.9–3.3)

## 2017-02-15 LAB — COMPREHENSIVE METABOLIC PANEL
ALBUMIN: 3.3 g/dL — AB (ref 3.5–5.0)
ALK PHOS: 141 U/L (ref 40–150)
ANION GAP: 10 meq/L (ref 3–11)
AST: 13 U/L (ref 5–34)
BILIRUBIN TOTAL: 0.31 mg/dL (ref 0.20–1.20)
BUN: 17.8 mg/dL (ref 7.0–26.0)
CALCIUM: 8.6 mg/dL (ref 8.4–10.4)
CHLORIDE: 113 meq/L — AB (ref 98–109)
CO2: 20 mEq/L — ABNORMAL LOW (ref 22–29)
CREATININE: 0.9 mg/dL (ref 0.7–1.3)
EGFR: >60 mL/min/{1.73_m2} (ref >60)
Glucose: 85 mg/dl (ref 70–140)
Potassium: 4.3 mEq/L (ref 3.5–5.1)
Sodium: 143 mEq/L (ref 136–145)
TOTAL PROTEIN: 8.3 g/dL (ref 6.4–8.3)

## 2017-02-15 MED ORDER — PACLITAXEL CHEMO INJECTION 300 MG/50ML
45.0000 mg/m2 | Freq: Once | INTRAVENOUS | Status: AC
  Administered 2017-02-15: 72 mg via INTRAVENOUS
  Filled 2017-02-15: qty 12

## 2017-02-15 MED ORDER — DIPHENHYDRAMINE HCL 50 MG/ML IJ SOLN
INTRAMUSCULAR | Status: AC
  Filled 2017-02-15: qty 1

## 2017-02-15 MED ORDER — SODIUM CHLORIDE 0.9 % IV SOLN
Freq: Once | INTRAVENOUS | Status: AC
  Administered 2017-02-15: 10:00:00 via INTRAVENOUS

## 2017-02-15 MED ORDER — FAMOTIDINE IN NACL 20-0.9 MG/50ML-% IV SOLN
20.0000 mg | Freq: Once | INTRAVENOUS | Status: AC
  Administered 2017-02-15: 20 mg via INTRAVENOUS

## 2017-02-15 MED ORDER — FAMOTIDINE IN NACL 20-0.9 MG/50ML-% IV SOLN
INTRAVENOUS | Status: AC
  Filled 2017-02-15: qty 50

## 2017-02-15 MED ORDER — SODIUM CHLORIDE 0.9 % IV SOLN
152.0000 mg | Freq: Once | INTRAVENOUS | Status: AC
  Administered 2017-02-15: 150 mg via INTRAVENOUS
  Filled 2017-02-15: qty 15

## 2017-02-15 MED ORDER — PALONOSETRON HCL INJECTION 0.25 MG/5ML
0.2500 mg | Freq: Once | INTRAVENOUS | Status: AC
  Administered 2017-02-15: 0.25 mg via INTRAVENOUS

## 2017-02-15 MED ORDER — SODIUM CHLORIDE 0.9 % IV SOLN
20.0000 mg | Freq: Once | INTRAVENOUS | Status: AC
  Administered 2017-02-15: 20 mg via INTRAVENOUS
  Filled 2017-02-15: qty 2

## 2017-02-15 MED ORDER — DARBEPOETIN ALFA 500 MCG/ML IJ SOSY
500.0000 ug | PREFILLED_SYRINGE | Freq: Once | INTRAMUSCULAR | Status: AC
  Administered 2017-02-15: 500 ug via SUBCUTANEOUS
  Filled 2017-02-15: qty 1

## 2017-02-15 MED ORDER — PALONOSETRON HCL INJECTION 0.25 MG/5ML
INTRAVENOUS | Status: AC
  Filled 2017-02-15: qty 5

## 2017-02-15 MED ORDER — DIPHENHYDRAMINE HCL 50 MG/ML IJ SOLN
50.0000 mg | Freq: Once | INTRAMUSCULAR | Status: AC
  Administered 2017-02-15: 50 mg via INTRAVENOUS

## 2017-02-15 MED ORDER — HEPARIN SOD (PORK) LOCK FLUSH 100 UNIT/ML IV SOLN
500.0000 [IU] | Freq: Once | INTRAVENOUS | Status: AC | PRN
  Administered 2017-02-15: 500 [IU]
  Filled 2017-02-15: qty 5

## 2017-02-15 NOTE — Patient Instructions (Signed)
[facility] Discharge Instructions for Patients Receiving Chemotherapy  Today you received the following chemotherapy agents Carboplatin and Taxol  To help prevent nausea and vomiting after your treatment, we encourage you to take your nausea medication as directed  If you develop nausea and vomiting that is not controlled by your nausea medication, call the clinic.   BELOW ARE SYMPTOMS THAT SHOULD BE REPORTED IMMEDIATELY:  *FEVER GREATER THAN 100.5 F  *CHILLS WITH OR WITHOUT FEVER  NAUSEA AND VOMITING THAT IS NOT CONTROLLED WITH YOUR NAUSEA MEDICATION  *UNUSUAL SHORTNESS OF BREATH  *UNUSUAL BRUISING OR BLEEDING  TENDERNESS IN MOUTH AND THROAT WITH OR WITHOUT PRESENCE OF ULCERS  *URINARY PROBLEMS  *BOWEL PROBLEMS  UNUSUAL RASH Items with * indicate a potential emergency and should be followed up as soon as possible.  Feel free to call the clinic should you have any questions or concerns. The clinic phone number is [phone].  Please show the [facility] at check-in to the Emergency Department and triage nurse.    Blood Transfusion, Adult A blood transfusion is a procedure in which you receive donated blood, including plasma, platelets, and red blood cells, through an IV tube. You may need a blood transfusion because of illness, surgery, or injury. The blood may come from a donor. You may also be able to donate blood for yourself (autologous blood donation) before a surgery if you know that you might require a blood transfusion. The blood given in a transfusion is made up of different types of cells. You may receive:  Red blood cells. These carry oxygen to the cells in the body.  White blood cells. These help you fight infections.  Platelets. These help your blood to clot.  Plasma. This is the liquid part of your blood and it helps with fluid imbalances.  If you have hemophilia or another clotting disorder, you may also receive other types of  blood products. Tell a health care provider about:  Any allergies you have.  All medicines you are taking, including vitamins, herbs, eye drops, creams, and over-the-counter medicines.  Any problems you or family members have had with anesthetic medicines.  Any blood disorders you have.  Any surgeries you have had.  Any medical conditions you have, including any recent fever or cold symptoms.  Whether you are pregnant or may be pregnant.  Any previous reactions you have had during a blood transfusion. What are the risks? Generally, this is a safe procedure. However, problems may occur, including:  Having an allergic reaction to something in the donated blood. Hives and itching may be symptoms of this type of reaction.  Fever. This may be a reaction to the white blood cells in the transfused blood. Nausea or chest pain may accompany a fever.  Iron overload. This can happen from having many transfusions.  Transfusion-related acute lung injury (TRALI). This is a rare reaction that causes lung damage. The cause is not known.TRALI can occur within hours of a transfusion or several days later.  Sudden (acute) or delayed hemolytic reactions. This happens if your blood does not match the cells in your transfusion. Your body's defense system (immune system) may try to attack the new cells. This complication is rare. The symptoms include fever, chills, nausea, and low back pain or chest pain.  Infection or disease transmission. This is rare.  What happens before the procedure?  You will have a blood test to determine your blood type. This is necessary to know what  kind of blood your body will accept and to match it to the donor blood.  If you are going to have a planned surgery, you may be able to do an autologous blood donation. This may be done in case you need to have a transfusion.  If you have had an allergic reaction to a transfusion in the past, you may be given medicine to help  prevent a reaction. This medicine may be given to you by mouth or through an IV tube.  You will have your temperature, blood pressure, and pulse monitored before the transfusion.  Follow instructions from your health care provider about eating and drinking restrictions.  Ask your health care provider about: ? Changing or stopping your regular medicines. This is especially important if you are taking diabetes medicines or blood thinners. ? Taking medicines such as aspirin and ibuprofen. These medicines can thin your blood. Do not take these medicines before your procedure if your health care provider instructs you not to. What happens during the procedure?  An IV tube will be inserted into one of your veins.  The bag of donated blood will be attached to your IV tube. The blood will then enter through your vein.  Your temperature, blood pressure, and pulse will be monitored regularly during the transfusion. This monitoring is done to detect early signs of a transfusion reaction.  If you have any signs or symptoms of a reaction, your transfusion will be stopped and you may be given medicine.  When the transfusion is complete, your IV tube will be removed.  Pressure may be applied to the IV site for a few minutes.  A bandage (dressing) will be applied. The procedure may vary among health care providers and hospitals. What happens after the procedure?  Your temperature, blood pressure, heart rate, breathing rate, and blood oxygen level will be monitored often.  Your blood may be tested to see how you are responding to the transfusion.  You may be warmed with fluids or blankets to maintain a normal body temperature. Summary  A blood transfusion is a procedure in which you receive donated blood, including plasma, platelets, and red blood cells, through an IV tube.  Your temperature, blood pressure, and pulse will be monitored before, during, and after the transfusion.  Your blood may  be tested after the transfusion to see how your body has responded. This information is not intended to replace advice given to you by your health care provider. Make sure you discuss any questions you have with your health care provider. Document Released: 04/22/2000 Document Revised: 01/21/2016 Document Reviewed: 01/21/2016 Elsevier Interactive Patient Education  [employer].

## 2017-02-15 NOTE — Progress Notes (Signed)
Hgb 8.6 today.  Okay to proceed with treatment, no blood transfusion needed.  Administer aranesp today per Tammi per Dr. Alvy Bimler

## 2017-02-22 ENCOUNTER — Other Ambulatory Visit (HOSPITAL_BASED_OUTPATIENT_CLINIC_OR_DEPARTMENT_OTHER): Payer: Medicare Other

## 2017-02-22 ENCOUNTER — Ambulatory Visit: Payer: Medicare Other

## 2017-02-22 ENCOUNTER — Ambulatory Visit (HOSPITAL_BASED_OUTPATIENT_CLINIC_OR_DEPARTMENT_OTHER): Payer: Medicare Other

## 2017-02-22 ENCOUNTER — Encounter: Payer: Self-pay | Admitting: Hematology and Oncology

## 2017-02-22 ENCOUNTER — Other Ambulatory Visit: Payer: Self-pay | Admitting: Hematology and Oncology

## 2017-02-22 ENCOUNTER — Ambulatory Visit (HOSPITAL_COMMUNITY)
Admission: RE | Admit: 2017-02-22 | Discharge: 2017-02-22 | Disposition: A | Discharge status: Home / Self Care | Payer: Medicare Other | Source: Ambulatory Visit | Attending: Hematology and Oncology | Admitting: Hematology and Oncology

## 2017-02-22 ENCOUNTER — Telehealth: Payer: Self-pay | Admitting: Hematology and Oncology

## 2017-02-22 ENCOUNTER — Ambulatory Visit (HOSPITAL_BASED_OUTPATIENT_CLINIC_OR_DEPARTMENT_OTHER): Payer: Medicare Other | Admitting: Hematology and Oncology

## 2017-02-22 VITALS — BP 147/67 | HR 72 | Temp 97.8°F | Resp 16

## 2017-02-22 DIAGNOSIS — C3412 Malignant neoplasm of upper lobe, left bronchus or lung: Secondary | ICD-10-CM

## 2017-02-22 DIAGNOSIS — R64 Cachexia: Secondary | ICD-10-CM | POA: Diagnosis not present

## 2017-02-22 DIAGNOSIS — Z5111 Encounter for antineoplastic chemotherapy: Secondary | ICD-10-CM

## 2017-02-22 DIAGNOSIS — D4620 Refractory anemia with excess of blasts, unspecified: Secondary | ICD-10-CM | POA: Insufficient documentation

## 2017-02-22 DIAGNOSIS — D696 Thrombocytopenia, unspecified: Secondary | ICD-10-CM | POA: Diagnosis not present

## 2017-02-22 DIAGNOSIS — D61818 Other pancytopenia: Secondary | ICD-10-CM

## 2017-02-22 DIAGNOSIS — C801 Malignant (primary) neoplasm, unspecified: Secondary | ICD-10-CM

## 2017-02-22 DIAGNOSIS — C7802 Secondary malignant neoplasm of left lung: Secondary | ICD-10-CM

## 2017-02-22 LAB — COMPREHENSIVE METABOLIC PANEL
AST: 11 U/L (ref 5–34)
Albumin: 3.3 g/dL — ABNORMAL LOW (ref 3.5–5.0)
Alkaline Phosphatase: 92 U/L (ref 40–150)
Anion Gap: 10 mEq/L (ref 3–11)
BUN: 21.9 mg/dL (ref 7.0–26.0)
CALCIUM: 9 mg/dL (ref 8.4–10.4)
CHLORIDE: 114 meq/L — AB (ref 98–109)
CO2: 16 meq/L — AB (ref 22–29)
CREATININE: 0.8 mg/dL (ref 0.7–1.3)
EGFR: >60 mL/min/{1.73_m2} (ref >60)
GLUCOSE: 83 mg/dL (ref 70–140)
Potassium: 4.7 mEq/L (ref 3.5–5.1)
SODIUM: 141 meq/L (ref 136–145)
Total Bilirubin: 0.42 mg/dL (ref 0.20–1.20)
Total Protein: 8 g/dL (ref 6.4–8.3)

## 2017-02-22 LAB — CBC WITH DIFFERENTIAL/PLATELET
BASO%: 0.4 % (ref 0.0–2.0)
BASOS ABS: 0 10*3/uL (ref 0.0–0.1)
EOS ABS: 0 10*3/uL (ref 0.0–0.5)
EOS%: 0.2 % (ref 0.0–7.0)
HCT: 23.1 % — ABNORMAL LOW (ref 38.4–49.9)
HGB: 7.3 g/dL — ABNORMAL LOW (ref 13.0–17.1)
LYMPH%: 6.5 % — AB (ref 14.0–49.0)
MCH: 30 pg (ref 27.2–33.4)
MCHC: 31.6 g/dL — AB (ref 32.0–36.0)
MCV: 95.1 fL (ref 79.3–98.0)
MONO#: 0.4 10*3/uL (ref 0.1–0.9)
MONO%: 4 % (ref 0.0–14.0)
NEUT#: 9.4 10*3/uL — ABNORMAL HIGH (ref 1.5–6.5)
NEUT%: 88.9 % — AB (ref 39.0–75.0)
PLATELETS: 125 10*3/uL — AB (ref 140–400)
RBC: 2.43 10*6/uL — AB (ref 4.20–5.82)
RDW: 26.7 % — ABNORMAL HIGH (ref 11.0–14.6)
WBC: 10.6 10*3/uL — AB (ref 4.0–10.3)
lymph#: 0.7 10*3/uL — ABNORMAL LOW (ref 0.9–3.3)

## 2017-02-22 LAB — PREPARE RBC (CROSSMATCH)

## 2017-02-22 MED ORDER — SODIUM CHLORIDE 0.9 % IV SOLN
152.0000 mg | Freq: Once | INTRAVENOUS | Status: AC
  Administered 2017-02-22: 150 mg via INTRAVENOUS
  Filled 2017-02-22: qty 15

## 2017-02-22 MED ORDER — SODIUM CHLORIDE 0.9 % IV SOLN
250.0000 mL | Freq: Once | INTRAVENOUS | Status: AC
Start: 2017-02-22 — End: 2017-02-22
  Administered 2017-02-22: 250 mL via INTRAVENOUS

## 2017-02-22 MED ORDER — PALONOSETRON HCL INJECTION 0.25 MG/5ML
0.2500 mg | Freq: Once | INTRAVENOUS | Status: AC
  Administered 2017-02-22: 0.25 mg via INTRAVENOUS

## 2017-02-22 MED ORDER — ACETAMINOPHEN 325 MG PO TABS
ORAL_TABLET | ORAL | Status: AC
  Filled 2017-02-22: qty 2

## 2017-02-22 MED ORDER — DIPHENHYDRAMINE HCL 25 MG PO CAPS
ORAL_CAPSULE | ORAL | Status: AC
  Filled 2017-02-22: qty 1

## 2017-02-22 MED ORDER — FAMOTIDINE IN NACL 20-0.9 MG/50ML-% IV SOLN
20.0000 mg | Freq: Once | INTRAVENOUS | Status: AC
  Administered 2017-02-22: 20 mg via INTRAVENOUS

## 2017-02-22 MED ORDER — SODIUM CHLORIDE 0.9 % IV SOLN
Freq: Once | INTRAVENOUS | Status: AC
  Administered 2017-02-22: 10:00:00 via INTRAVENOUS

## 2017-02-22 MED ORDER — DIPHENHYDRAMINE HCL 50 MG/ML IJ SOLN
INTRAMUSCULAR | Status: AC
  Filled 2017-02-22: qty 1

## 2017-02-22 MED ORDER — SODIUM CHLORIDE 0.9% FLUSH
10.0000 mL | INTRAVENOUS | Status: DC | PRN
  Administered 2017-02-22: 10 mL
  Filled 2017-02-22: qty 10

## 2017-02-22 MED ORDER — SODIUM CHLORIDE 0.9 % IV SOLN
45.0000 mg/m2 | Freq: Once | INTRAVENOUS | Status: AC
  Administered 2017-02-22: 72 mg via INTRAVENOUS
  Filled 2017-02-22: qty 12

## 2017-02-22 MED ORDER — HEPARIN SOD (PORK) LOCK FLUSH 100 UNIT/ML IV SOLN
500.0000 [IU] | Freq: Once | INTRAVENOUS | Status: AC | PRN
  Administered 2017-02-22: 500 [IU]
  Filled 2017-02-22: qty 5

## 2017-02-22 MED ORDER — ACETAMINOPHEN 325 MG PO TABS
650.0000 mg | ORAL_TABLET | Freq: Once | ORAL | Status: AC
  Administered 2017-02-22: 650 mg via ORAL

## 2017-02-22 MED ORDER — SODIUM CHLORIDE 0.9 % IV SOLN
20.0000 mg | Freq: Once | INTRAVENOUS | Status: AC
  Administered 2017-02-22: 20 mg via INTRAVENOUS
  Filled 2017-02-22: qty 2

## 2017-02-22 MED ORDER — DIPHENHYDRAMINE HCL 50 MG/ML IJ SOLN
50.0000 mg | Freq: Once | INTRAMUSCULAR | Status: AC
  Administered 2017-02-22: 50 mg via INTRAVENOUS

## 2017-02-22 MED ORDER — PALONOSETRON HCL INJECTION 0.25 MG/5ML
INTRAVENOUS | Status: AC
  Filled 2017-02-22: qty 5

## 2017-02-22 MED ORDER — FAMOTIDINE IN NACL 20-0.9 MG/50ML-% IV SOLN
INTRAVENOUS | Status: AC
  Filled 2017-02-22: qty 50

## 2017-02-22 MED ORDER — DIPHENHYDRAMINE HCL 25 MG PO CAPS
25.0000 mg | ORAL_CAPSULE | Freq: Once | ORAL | Status: AC
  Administered 2017-02-22: 25 mg via ORAL

## 2017-02-22 MED ORDER — SODIUM CHLORIDE 0.9% FLUSH
10.0000 mL | Freq: Once | INTRAVENOUS | Status: AC
  Administered 2017-02-22: 10 mL
  Filled 2017-02-22: qty 10

## 2017-02-22 NOTE — Assessment & Plan Note (Signed)
His appetite is improved. He is doing well on low-dose prednisone therapy Continue the same

## 2017-02-22 NOTE — Progress Notes (Signed)
[facility] OFFICE PROGRESS NOTE  Patient Care Team: [name], MD as PCP - General (Family Medicine) [name], MD as Consulting Physician (Hematology and Oncology)  SUMMARY OF ONCOLOGIC HISTORY: Oncology History   MDS, R-IPSS score of 3, low risk (Hg 8.9, +16 chromosome on BM, 2% blast count)   Squamous cell carcinoma of the epiglottis   Primary site: Larynx - Supraglottis (Left)   Staging method: AJCC 7th Edition   Clinical: Stage I (T1, N0, M0) signed by [name], MD on 04/30/2013 12:49 PM   Pathologic: Stage I (T1, N0, cM0) signed by [name], MD on 04/30/2013 12:49 PM   Summary: Stage I (T1, N0, cM0)       MDS (myelodysplastic syndrome), low grade ([place])   02/01/2007 Bone Marrow Biopsy    BM biopsy was abnormal, overall probable low grade MDS      03/23/2011 - 02/06/2013 Chemotherapy    [name], discontinued due to diagnosis of laryngeal ca      09/06/2013 Bone Marrow Biopsy    Repeat bone marrow aspirate and biopsy confirmed low-grade myelodysplastic syndrome.      09/17/2013 -  Chemotherapy    The patient resumed darbepoetin injections to treat the anemia.       History of head and neck cancer   03/06/2013 Procedure    Biopsy from epiglottic region showed invasive [facility]      04/25/2013 Imaging    Ct scan showed no other involvement in the LN      05/13/2013 - 06/28/2013 Radiation Therapy    He received radiation therapy, 70 gray in 35 fractions to the larynx      06/09/2016 Imaging    CT scan of the chest showed Bilateral spiculated soft tissue pulmonary masses, highly suspicious for multifocal malignancy. Borderline enlarged left-sided mediastinal lymphadenopathy. Background of severe emphysematous changes, chronic cylindrical bronchiectasis and hyperinflation of the lungs. Findings consistent with longstanding COPD. Diffusely thickened interstitial markings. This may represent chronic interstitial lung changes, however  lymphangitic spread of malignancy cannot be excluded. Anterior compression deformities of several of the thoracic vertebral bodies, with heterogeneous appearance of T7 and T9 vertebral body. This may represent degenerative changes, however osseous metastatic disease cannot be excluded.       07/11/2016 PET scan    There are 2 nodules in the left upper lobe and 2 nodules within the right lower lobe which have spiculated margin and exhibit intense radiotracer uptake compatible with multifocal pulmonary metastasis versus multiple synchronous primary pulmonary neoplasms. 2. Emphysema 3. Aortic atherosclerosis and multi vessel coronary artery calcification.       Cancer of upper lobe of left lung ([place])   06/09/2016 Imaging    CT chest: Bilateral spiculated soft tissue pulmonary masses, highly suspicious for multifocal malignancy. Borderline enlarged left-sided mediastinal lymphadenopathy. Background of severe emphysematous changes, chronic cylindrical bronchiectasis and hyperinflation of the lungs. Findings consistent with longstanding COPD. Diffusely thickened interstitial markings. This may represent chronic interstitial lung changes, however lymphangitic spread of malignancy cannot be excluded. Anterior compression deformities of several of the thoracic vertebral bodies, with heterogeneous appearance of T7 and T9 vertebral body. This may represent degenerative changes, however osseous metastatic disease cannot be excluded.       06/29/2016 Imaging    MRI brain: No evidence of metastatic disease or recent infarction. Advanced generalized brain atrophy with moderate to marked chronic small-vessel ischemic changes throughout. FLAIR imaging appears to show scattered gyri showing FLAIR hyperintensity. These abnormalities are not  confirmed with restricted diffusion or contrast enhancement. Therefore, we are not certain that this is not artifactual. The differential diagnosis does include true pathology such  as posterior reversible encephalopathy, viral or prion disease, postictal change in post chemotherapy change. If there is concern about active CNS pathology, re- scanning in 4-6 weeks could be useful.      07/27/2016 Imaging    CT chest: Bilateral pulmonary lesions. These are suspicious for metastatic disease. Lesions have minimally changed but there may be slight enlargement of the cavitary lesion in the right lower lobe. Severe emphysematous changes.       08/10/2016 Pathology Results    1. Lung, biopsy, RUL, (apical segment) - SCANT BENIGN LUNG PARENCHYMA. - THERE IS NO EVIDENCE OF MALIGNANCY. 2. Lung, biopsy, LUL lingula - SQUAMOUS CELL CARCINOMA. - SEE COMMENT. 3. Lung, biopsy, LUL - ADENOCARCINOMA. - SEE COMMENT. Microscopic Comment 2. The malignant cells are positive for cytokeratin 56 and p63. They are negative for TTF-1. The findings are consistent with squamous cell carcinoma. There is likely insufficient tissue remaining for additional studies, if requested. 3. The malignant cells are positive for TTF-1 and negative for p63 and cytokeratin 5/6. The findings are consistent with adenocarcinoma.      08/10/2016 Procedure    The targets were defined as follows: Left upper lobe nodule, target 1 Lingular nodule, target 2 Proximal right lower lobe nodule, target 3 More distal right lower lobe, nodule  The extendable working channel was secured into place and the locator guide was withdrawn. Under fluoroscopic guidance transbronchial needle brushings, transbronchial Wang needle biopsies, and transbronchial forceps biopsies were performed in the LUL (target 1) and the lingula (target 2) to be sent for cytology and pathology. A bronchioalveolar lavage was performed in the lingula in the vicinity of target 2  and sent for cytology and microbiology (bacterial, fungal, AFB smears and cultures).  Fiducial markers were then placed around target 1, target 2, and in the right lower lobe in the  vicinity of both target 3 and target 4 to be used for possible radiation therapy should this be indicated. At the end of the procedure a general airway inspection was performed and there was no evidence of active bleeding. The bronchoscope was removed.  The patient tolerated the procedure well. There was no significant blood loss and there were no obvious complications. A post-procedural chest x-ray is pending.  Samples: 1. Transbronchial needle brushings from targets 1 and 2 2. Transbronchial Wang needle biopsies from targets 1 and 2 3. Transbronchial forceps biopsies from targets 1 and 2 4. Bronchoalveolar lavage from lingula, target 2 5. Endobronchial biopsies from RUL apical segment 6. Endobronchial brushings from right upper lobe apical segment       08/10/2016 Genetic Testing    Patient has genetic testing done for [employer] and PD-L1 testing Results revealed PD-L1 testing is negative 0%      08/31/2016 Procedure    Technically successful right IJ power-injectable port catheter placement. Ready for routine use      11/30/2016 PET scan    1. Interval resection of the hypermetabolic lingular nodule. 2. The 3 other spiculated, hypermetabolic nodules within the left upper and right lower lobes are slightly smaller with decreased metabolic activity, consistent with some response to interval therapy. 3. No progressive disease       INTERVAL HISTORY: Please see below for problem oriented charting. He returns for further follow-up He continues to have mild productive cough with white sputum No hemoptysis He  denies recent smoking No recent fever or chills He denies peripheral neuropathy from treatment He complain of fatigue No chest pain, shortness of breath or dizziness  REVIEW OF SYSTEMS:   Constitutional: Denies fevers, chills or abnormal weight loss Eyes: Denies blurriness of vision Ears, nose, mouth, throat, and face: Denies mucositis or sore throat Cardiovascular:  Denies palpitation, chest discomfort or lower extremity swelling Gastrointestinal:  Denies nausea, heartburn or change in bowel habits Skin: Denies abnormal skin rashes Lymphatics: Denies new lymphadenopathy or easy bruising Neurological:Denies numbness, tingling or new weaknesses Behavioral/Psych: Mood is stable, no new changes  All other systems were reviewed with the patient and are negative.  I have reviewed the past medical history, past surgical history, social history and family history with the patient and they are unchanged from previous note.  ALLERGIES:  is allergic to no known allergies.  MEDICATIONS:  Current Outpatient Prescriptions  Medication Sig Dispense Refill  . albuterol (PROVENTIL HFA;VENTOLIN HFA) 108 (90 Base) MCG/ACT inhaler Inhale 2 puffs into the lungs every 4 (four) hours as needed for wheezing or shortness of breath. 1 Inhaler 5  . cyanocobalamin 500 MCG tablet Take 500 mcg by mouth daily.    . ferrous sulfate 325 (65 FE) MG tablet Take 325 mg by mouth daily with breakfast.    . folic acid (FOLVITE) 1 MG tablet Take 1 mg by mouth daily.    . Glycopyrrolate-Formoterol (BEVESPI AEROSPHERE) 9-4.8 MCG/ACT AERO Inhale 2 puffs into the lungs 2 (two) times daily. 2 Inhaler 0  . levETIRAcetam (KEPPRA) 500 MG tablet Take 1 tablet (500 mg total) by mouth 2 (two) times daily. 60 tablet 3  . lidocaine-prilocaine (EMLA) cream Apply to affected area once 30 g 3  . ondansetron (ZOFRAN) 8 MG tablet Take 1 tablet (8 mg total) by mouth 2 (two) times daily as needed for refractory nausea / vomiting. Start on day 3 after chemo. 30 tablet 1  . predniSONE (DELTASONE) 10 MG tablet Take 1 tablet (10 mg total) by mouth daily with breakfast. 60 tablet 1  . predniSONE (DELTASONE) 5 MG tablet TAKE 1 TABLET (5 MG TOTAL) BY MOUTH DAILY WITH BREAKFAST. 30 tablet 1  . prochlorperazine (COMPAZINE) 10 MG tablet Take 10 mg by mouth every 6 (six) hours as needed.    . vitamin E 1000 UNIT capsule  Take 1,000 Units by mouth daily.      No current facility-administered medications for this visit.     PHYSICAL EXAMINATION: ECOG PERFORMANCE STATUS: 2 - Symptomatic, <50% confined to bed  Vitals:   02/22/17 0903  BP: (!) 121/59  Pulse: 88  Resp: 18   Filed Weights   02/22/17 0903  Weight: 107 lb 9.6 oz (48.8 kg)    GENERAL:alert, no distress and comfortable.  He looks thin and cachectic SKIN: skin color, texture, turgor are normal, no rashes or significant lesions EYES: normal, Conjunctiva are pink and non-injected, sclera clear OROPHARYNX:no exudate, no erythema and lips, buccal mucosa, and tongue normal  NECK: supple, thyroid normal size, non-tender, without nodularity LYMPH:  no palpable lymphadenopathy in the cervical, axillary or inguinal LUNGS: clear to auscultation and percussion with normal breathing effort HEART: regular rate & rhythm and no murmurs and no lower extremity edema ABDOMEN:abdomen soft, non-tender and normal bowel sounds Musculoskeletal:no cyanosis of digits and no clubbing  NEURO: alert & oriented x 3 with fluent speech, no focal motor/sensory deficits  LABORATORY DATA:  I have reviewed the data as listed    Component Value  Date/Time   NA 143 02/15/2017 0919   K 4.3 02/15/2017 0919   CL 108 08/31/2016 1155   CL 107 09/19/2012 1325   CO2 20 (L) 02/15/2017 0919   GLUCOSE 85 02/15/2017 0919   GLUCOSE 97 09/19/2012 1325   BUN 17.8 02/15/2017 0919   CREATININE 0.9 02/15/2017 0919   CALCIUM 8.6 02/15/2017 0919   PROT 8.3 02/15/2017 0919   ALBUMIN 3.3 (L) 02/15/2017 0919   AST 13 02/15/2017 0919   ALT <6 02/15/2017 0919   ALKPHOS 141 02/15/2017 0919   BILITOT 0.31 02/15/2017 0919   GFRNONAA >60 08/31/2016 1155   GFRAA >60 08/31/2016 1155    No results found for: SPEP, UPEP  Lab Results  Component Value Date   WBC 10.6 (H) 02/22/2017   NEUTROABS 9.4 (H) 02/22/2017   HGB 7.3 (L) 02/22/2017   HCT 23.1 (L) 02/22/2017   MCV 95.1 02/22/2017    PLT 125 (L) 02/22/2017      Chemistry      Component Value Date/Time   NA 143 02/15/2017 0919   K 4.3 02/15/2017 0919   CL 108 08/31/2016 1155   CL 107 09/19/2012 1325   CO2 20 (L) 02/15/2017 0919   BUN 17.8 02/15/2017 0919   CREATININE 0.9 02/15/2017 0919      Component Value Date/Time   CALCIUM 8.6 02/15/2017 0919   ALKPHOS 141 02/15/2017 0919   AST 13 02/15/2017 0919   ALT <6 02/15/2017 0919   BILITOT 0.31 02/15/2017 0919      ASSESSMENT & PLAN:  Cancer of upper lobe of left lung ([place]) Recent PET CT scan on 11/29/16 showed positive response to treatment I recommend we continue the same dose of chemotherapy, carboplatin and Taxol on days 1 and 8 and rest day 15 He will get Neulasta support after day 8 treatment I plan to repeat imaging study next week   MDS (myelodysplastic syndrome), low grade ([place]) He had responded well to Darbopeitin in the past with improvement in energy level.  The patient will get regular blood work done. The patient will continue on Aranep injection to keep hemoglobin greater than 10 g. He will continue to get darbepoetin injection while on treatment, scheduled to be given every 2 weeks. He had recently received blood transfusion with symptomatic improvement.   Today, he is mildly symptomatic. We discussed some of the risks, benefits, and alternatives of blood transfusions. The patient is symptomatic from anemia and the hemoglobin level is critically low.  Some of the side-effects to be expected including risks of transfusion reactions, chills, infection, syndrome of volume overload and risk of hospitalization from various reasons and the patient is willing to proceed and went ahead to sign consent today. I plan to give him a unit of blood after chemo today  Thrombocytopenia ([place]) This is likely due to recent treatment. The patient denies recent history of bleeding such as epistaxis, hematuria or hematochezia. He is asymptomatic from the low platelet  count. I will observe for now.  he does not require transfusion now. I will continue the chemotherapy at current dose without dosage adjustment.  If the thrombocytopenia gets progressive worse in the future, I might have to delay his treatment or adjust the chemotherapy dose.  Malignant cachexia ([place]) His appetite is improved. He is doing well on low-dose prednisone therapy Continue the same   No orders of the defined types were placed in this encounter.  All questions were answered. The patient knows to call the clinic  with any problems, questions or concerns. No barriers to learning was detected. I spent 25 minutes counseling the patient face to face. The total time spent in the appointment was 40 minutes and more than 50% was on counseling and review of test results     [name], MD 02/22/2017 9:25 AM

## 2017-02-22 NOTE — Assessment & Plan Note (Signed)
Recent PET CT scan on 11/29/16 showed positive response to treatment I recommend we continue the same dose of chemotherapy, carboplatin and Taxol on days 1 and 8 and rest day 15 He will get Neulasta support after day 8 treatment I plan to repeat imaging study next week

## 2017-02-22 NOTE — Assessment & Plan Note (Signed)
He had responded well to Darbopeitin in the past with improvement in energy level.  The patient will get regular blood work done. The patient will continue on Aranep injection to keep hemoglobin greater than 10 g. He will continue to get darbepoetin injection while on treatment, scheduled to be given every 2 weeks. He had recently received blood transfusion with symptomatic improvement.   Today, he is mildly symptomatic. We discussed some of the risks, benefits, and alternatives of blood transfusions. The patient is symptomatic from anemia and the hemoglobin level is critically low.  Some of the side-effects to be expected including risks of transfusion reactions, chills, infection, syndrome of volume overload and risk of hospitalization from various reasons and the patient is willing to proceed and went ahead to sign consent today. I plan to give him a unit of blood after chemo today

## 2017-02-22 NOTE — Assessment & Plan Note (Signed)
This is likely due to recent treatment. The patient denies recent history of bleeding such as epistaxis, hematuria or hematochezia. He is asymptomatic from the low platelet count. I will observe for now.  he does not require transfusion now. I will continue the chemotherapy at current dose without dosage adjustment.  If the thrombocytopenia gets progressive worse in the future, I might have to delay his treatment or adjust the chemotherapy dose.

## 2017-02-22 NOTE — Telephone Encounter (Signed)
Scheduled appt per 10/17 los - Gave patient AVS and calender per los.

## 2017-02-22 NOTE — Patient Instructions (Signed)
[facility] Discharge Instructions for Patients Receiving Chemotherapy  Today you received the following chemotherapy agents Taxol, Carboplatin.   To help prevent nausea and vomiting after your treatment, we encourage you to take your nausea medication as prescribed.   If you develop nausea and vomiting that is not controlled by your nausea medication, call the clinic.   BELOW ARE SYMPTOMS THAT SHOULD BE REPORTED IMMEDIATELY:  *FEVER GREATER THAN 100.5 F  *CHILLS WITH OR WITHOUT FEVER  NAUSEA AND VOMITING THAT IS NOT CONTROLLED WITH YOUR NAUSEA MEDICATION  *UNUSUAL SHORTNESS OF BREATH  *UNUSUAL BRUISING OR BLEEDING  TENDERNESS IN MOUTH AND THROAT WITH OR WITHOUT PRESENCE OF ULCERS  *URINARY PROBLEMS  *BOWEL PROBLEMS  UNUSUAL RASH Items with * indicate a potential emergency and should be followed up as soon as possible.  Feel free to call the clinic should you have any questions or concerns. The clinic phone number is [phone].  Please show the [facility] at check-in to the Emergency Department and triage nurse.    Blood Transfusion, Care After This sheet gives you information about how to care for yourself after your procedure. Your doctor may also give you more specific instructions. If you have problems or questions, contact your doctor. Follow these instructions at home:  Take over-the-counter and prescription medicines only as told by your doctor.  Go back to your normal activities as told by your doctor.  Follow instructions from your doctor about how to take care of the area where an IV tube was put into your vein (insertion site). Make sure you: ? Wash your hands with soap and water before you change your bandage (dressing). If there is no soap and water, use hand sanitizer. ? Change your bandage as told by your doctor.  Check your IV insertion site every day for signs of infection. Check for: ? More redness, swelling, or pain. ? More  fluid or blood. ? Warmth. ? Pus or a bad smell. Contact a doctor if:  You have more redness, swelling, or pain around the IV insertion site..  You have more fluid or blood coming from the IV insertion site.  Your IV insertion site feels warm to the touch.  You have pus or a bad smell coming from the IV insertion site.  Your pee (urine) turns pink, red, or brown.  You feel weak after doing your normal activities. Get help right away if:  You have signs of a serious allergic or body defense (immune) system reaction, including: ? Itchiness. ? Hives. ? Trouble breathing. ? Anxiety. ? Pain in your chest or lower back. ? Fever, flushing, and chills. ? Fast pulse. ? Rash. ? Watery poop (diarrhea). ? Throwing up (vomiting). ? Dark pee. ? Serious headache. ? Dizziness. ? Stiff neck. ? Yellow color in your face or the white parts of your eyes (jaundice). Summary  After a blood transfusion, return to your normal activities as told by your doctor.  Every day, check for signs of infection where the IV tube was put into your vein.  Some signs of infection are warm skin, more redness and pain, more fluid or blood, and pus or a bad smell where the needle went in.  Contact your doctor if you feel weak or have any unusual symptoms. This information is not intended to replace advice given to you by your health care provider. Make sure you discuss any questions you have with your health care provider. Document Released: 05/16/2014 Document Revised:  12/18/2015 Document Reviewed: 12/18/2015 Elsevier Interactive Patient Education  2017 [employer].

## 2017-02-23 LAB — TYPE AND SCREEN
ABO/RH(D): O POS
Antibody Screen: NEGATIVE
DONOR AG TYPE: NEGATIVE
UNIT DIVISION: 0

## 2017-02-23 LAB — BPAM RBC
BLOOD PRODUCT EXPIRATION DATE: 201811052359
ISSUE DATE / TIME: 201810171251
Unit Type and Rh: 5100

## 2017-02-24 ENCOUNTER — Ambulatory Visit (HOSPITAL_BASED_OUTPATIENT_CLINIC_OR_DEPARTMENT_OTHER): Payer: Medicare Other

## 2017-02-24 VITALS — BP 131/72 | HR 85 | Temp 97.9°F | Resp 18

## 2017-02-24 DIAGNOSIS — Z5189 Encounter for other specified aftercare: Secondary | ICD-10-CM

## 2017-02-24 DIAGNOSIS — C7802 Secondary malignant neoplasm of left lung: Secondary | ICD-10-CM

## 2017-02-24 DIAGNOSIS — C801 Malignant (primary) neoplasm, unspecified: Secondary | ICD-10-CM

## 2017-02-24 DIAGNOSIS — C3412 Malignant neoplasm of upper lobe, left bronchus or lung: Secondary | ICD-10-CM | POA: Diagnosis not present

## 2017-02-24 MED ORDER — PEGFILGRASTIM INJECTION 6 MG/0.6ML ~~LOC~~
6.0000 mg | PREFILLED_SYRINGE | Freq: Once | SUBCUTANEOUS | Status: AC
  Administered 2017-02-24: 6 mg via SUBCUTANEOUS
  Filled 2017-02-24: qty 0.6

## 2017-02-24 NOTE — Patient Instructions (Signed)
Pegfilgrastim injection What is this medicine? PEGFILGRASTIM (PEG fil gra stim) is a long-acting granulocyte colony-stimulating factor that stimulates the growth of neutrophils, a type of white blood cell important in the body's fight against infection. It is used to reduce the incidence of fever and infection in patients with certain types of cancer who are receiving chemotherapy that affects the bone marrow, and to increase survival after being exposed to high doses of radiation. This medicine may be used for other purposes; ask your health care provider or pharmacist if you have questions. COMMON BRAND NAME(S): Neulasta What should I tell my health care provider before I take this medicine? They need to know if you have any of these conditions: -kidney disease -latex allergy -ongoing radiation therapy -sickle cell disease -skin reactions to acrylic adhesives (On-Body Injector only) -an unusual or allergic reaction to pegfilgrastim, filgrastim, other medicines, foods, dyes, or preservatives -pregnant or trying to get pregnant -breast-feeding How should I use this medicine? This medicine is for injection under the skin. If you get this medicine at home, you will be taught how to prepare and give the pre-filled syringe or how to use the On-body Injector. Refer to the patient Instructions for Use for detailed instructions. Use exactly as directed. Tell your healthcare provider immediately if you suspect that the On-body Injector may not have performed as intended or if you suspect the use of the On-body Injector resulted in a missed or partial dose. It is important that you put your used needles and syringes in a special sharps container. Do not put them in a trash can. If you do not have a sharps container, call your pharmacist or healthcare provider to get one. Talk to your pediatrician regarding the use of this medicine in children. While this drug may be prescribed for selected conditions,  precautions do apply. Overdosage: If you think you have taken too much of this medicine contact a poison control center or emergency room at once. NOTE: This medicine is only for you. Do not share this medicine with others. What if I miss a dose? It is important not to miss your dose. Call your doctor or health care professional if you miss your dose. If you miss a dose due to an On-body Injector failure or leakage, a new dose should be administered as soon as possible using a single prefilled syringe for manual use. What may interact with this medicine? Interactions have not been studied. Give your health care provider a list of all the medicines, herbs, non-prescription drugs, or dietary supplements you use. Also tell them if you smoke, drink alcohol, or use illegal drugs. Some items may interact with your medicine. This list may not describe all possible interactions. Give your health care provider a list of all the medicines, herbs, non-prescription drugs, or dietary supplements you use. Also tell them if you smoke, drink alcohol, or use illegal drugs. Some items may interact with your medicine. What should I watch for while using this medicine? You may need blood work done while you are taking this medicine. If you are going to need a MRI, CT scan, or other procedure, tell your doctor that you are using this medicine (On-Body Injector only). What side effects may I notice from receiving this medicine? Side effects that you should report to your doctor or health care professional as soon as possible: -allergic reactions like skin rash, itching or hives, swelling of the face, lips, or tongue -dizziness -fever -pain, redness, or irritation at site   where injected -pinpoint red spots on the skin -red or dark-brown urine -shortness of breath or breathing problems -stomach or side pain, or pain at the shoulder -swelling -tiredness -trouble passing urine or change in the amount of urine Side  effects that usually do not require medical attention (report to your doctor or health care professional if they continue or are bothersome): -bone pain -muscle pain This list may not describe all possible side effects. Call your doctor for medical advice about side effects. You may report side effects to FDA at 1-800-FDA-1088. Where should I keep my medicine? Keep out of the reach of children. Store pre-filled syringes in a refrigerator between 2 and 8 degrees C (36 and 46 degrees F). Do not freeze. Keep in carton to protect from light. Throw away this medicine if it is left out of the refrigerator for more than 48 hours. Throw away any unused medicine after the expiration date. NOTE: This sheet is a summary. It may not cover all possible information. If you have questions about this medicine, talk to your doctor, pharmacist, or health care provider.  2018 Elsevier/Gold Standard (2016-04-21 12:58:03)  

## 2017-03-02 ENCOUNTER — Encounter: Payer: Self-pay | Admitting: *Deleted

## 2017-03-06 ENCOUNTER — Ambulatory Visit (HOSPITAL_COMMUNITY)
Admission: RE | Admit: 2017-03-06 | Discharge: 2017-03-06 | Disposition: A | Discharge status: Home / Self Care | Payer: Medicare Other | Source: Ambulatory Visit | Attending: Hematology and Oncology | Admitting: Hematology and Oncology

## 2017-03-06 DIAGNOSIS — D469 Myelodysplastic syndrome, unspecified: Secondary | ICD-10-CM | POA: Insufficient documentation

## 2017-03-06 DIAGNOSIS — I2510 Atherosclerotic heart disease of native coronary artery without angina pectoris: Secondary | ICD-10-CM | POA: Insufficient documentation

## 2017-03-06 DIAGNOSIS — J439 Emphysema, unspecified: Secondary | ICD-10-CM | POA: Diagnosis not present

## 2017-03-06 DIAGNOSIS — J984 Other disorders of lung: Secondary | ICD-10-CM | POA: Diagnosis not present

## 2017-03-06 DIAGNOSIS — C3412 Malignant neoplasm of upper lobe, left bronchus or lung: Secondary | ICD-10-CM | POA: Diagnosis not present

## 2017-03-06 DIAGNOSIS — C3490 Malignant neoplasm of unspecified part of unspecified bronchus or lung: Secondary | ICD-10-CM | POA: Diagnosis not present

## 2017-03-06 DIAGNOSIS — Q6102 Congenital multiple renal cysts: Secondary | ICD-10-CM | POA: Insufficient documentation

## 2017-03-06 DIAGNOSIS — R911 Solitary pulmonary nodule: Secondary | ICD-10-CM | POA: Diagnosis not present

## 2017-03-06 DIAGNOSIS — D4620 Refractory anemia with excess of blasts, unspecified: Secondary | ICD-10-CM | POA: Insufficient documentation

## 2017-03-06 DIAGNOSIS — I700 Atherosclerosis of aorta: Secondary | ICD-10-CM | POA: Insufficient documentation

## 2017-03-06 LAB — GLUCOSE, CAPILLARY: GLUCOSE-CAPILLARY: 81 mg/dL (ref 65–99)

## 2017-03-06 MED ORDER — FLUDEOXYGLUCOSE F - 18 (FDG) INJECTION
4.7000 | Freq: Once | INTRAVENOUS | Status: AC | PRN
  Administered 2017-03-06: 4.7 via INTRAVENOUS

## 2017-03-08 ENCOUNTER — Other Ambulatory Visit (HOSPITAL_BASED_OUTPATIENT_CLINIC_OR_DEPARTMENT_OTHER): Payer: Medicare Other

## 2017-03-08 ENCOUNTER — Other Ambulatory Visit: Payer: Self-pay | Admitting: Hematology and Oncology

## 2017-03-08 ENCOUNTER — Ambulatory Visit (HOSPITAL_BASED_OUTPATIENT_CLINIC_OR_DEPARTMENT_OTHER): Payer: Medicare Other | Admitting: Hematology and Oncology

## 2017-03-08 ENCOUNTER — Ambulatory Visit (HOSPITAL_BASED_OUTPATIENT_CLINIC_OR_DEPARTMENT_OTHER): Payer: Medicare Other

## 2017-03-08 ENCOUNTER — Encounter: Payer: Self-pay | Admitting: Hematology and Oncology

## 2017-03-08 ENCOUNTER — Ambulatory Visit: Payer: Medicare Other

## 2017-03-08 VITALS — BP 133/64 | HR 70 | Temp 97.9°F | Resp 20

## 2017-03-08 VITALS — BP 135/61 | HR 77 | Temp 98.0°F | Resp 18 | Ht 72.0 in | Wt 109.6 lb

## 2017-03-08 DIAGNOSIS — C801 Malignant (primary) neoplasm, unspecified: Secondary | ICD-10-CM

## 2017-03-08 DIAGNOSIS — D4620 Refractory anemia with excess of blasts, unspecified: Secondary | ICD-10-CM

## 2017-03-08 DIAGNOSIS — C3412 Malignant neoplasm of upper lobe, left bronchus or lung: Secondary | ICD-10-CM

## 2017-03-08 DIAGNOSIS — C7802 Secondary malignant neoplasm of left lung: Secondary | ICD-10-CM

## 2017-03-08 DIAGNOSIS — D61818 Other pancytopenia: Secondary | ICD-10-CM

## 2017-03-08 DIAGNOSIS — Z5111 Encounter for antineoplastic chemotherapy: Secondary | ICD-10-CM | POA: Diagnosis not present

## 2017-03-08 LAB — CBC WITH DIFFERENTIAL/PLATELET
BASO%: 0.2 % (ref 0.0–2.0)
BASOS ABS: 0 10*3/uL (ref 0.0–0.1)
EOS%: 0.2 % (ref 0.0–7.0)
Eosinophils Absolute: 0 10*3/uL (ref 0.0–0.5)
HEMATOCRIT: 23.7 % — AB (ref 38.4–49.9)
HEMOGLOBIN: 7.5 g/dL — AB (ref 13.0–17.1)
LYMPH#: 1.1 10*3/uL (ref 0.9–3.3)
LYMPH%: 4.7 % — ABNORMAL LOW (ref 14.0–49.0)
MCH: 29.4 pg (ref 27.2–33.4)
MCHC: 31.6 g/dL — ABNORMAL LOW (ref 32.0–36.0)
MCV: 92.9 fL (ref 79.3–98.0)
MONO#: 1.4 10*3/uL — AB (ref 0.1–0.9)
MONO%: 6.3 % (ref 0.0–14.0)
NEUT#: 20 10*3/uL — ABNORMAL HIGH (ref 1.5–6.5)
NEUT%: 88.6 % — AB (ref 39.0–75.0)
NRBC: 0 % (ref 0–0)
Platelets: 169 10*3/uL (ref 140–400)
RBC: 2.55 10*6/uL — ABNORMAL LOW (ref 4.20–5.82)
RDW: 25.2 % — AB (ref 11.0–14.6)
WBC: 22.5 10*3/uL — ABNORMAL HIGH (ref 4.0–10.3)

## 2017-03-08 LAB — COMPREHENSIVE METABOLIC PANEL
ALBUMIN: 3.2 g/dL — AB (ref 3.5–5.0)
ALK PHOS: 119 U/L (ref 40–150)
AST: 11 U/L (ref 5–34)
Anion Gap: 8 mEq/L (ref 3–11)
BUN: 17.3 mg/dL (ref 7.0–26.0)
CALCIUM: 8.6 mg/dL (ref 8.4–10.4)
CO2: 21 mEq/L — ABNORMAL LOW (ref 22–29)
CREATININE: 0.8 mg/dL (ref 0.7–1.3)
Chloride: 112 mEq/L — ABNORMAL HIGH (ref 98–109)
EGFR: >60 mL/min/{1.73_m2} (ref >60)
GLUCOSE: 86 mg/dL (ref 70–140)
POTASSIUM: 4.4 meq/L (ref 3.5–5.1)
SODIUM: 141 meq/L (ref 136–145)
TOTAL PROTEIN: 7.9 g/dL (ref 6.4–8.3)
Total Bilirubin: 0.26 mg/dL (ref 0.20–1.20)

## 2017-03-08 LAB — PREPARE RBC (CROSSMATCH)

## 2017-03-08 MED ORDER — ACETAMINOPHEN 325 MG PO TABS
650.0000 mg | ORAL_TABLET | Freq: Once | ORAL | Status: AC
  Administered 2017-03-08: 650 mg via ORAL

## 2017-03-08 MED ORDER — PALONOSETRON HCL INJECTION 0.25 MG/5ML
0.2500 mg | Freq: Once | INTRAVENOUS | Status: AC
  Administered 2017-03-08: 0.25 mg via INTRAVENOUS

## 2017-03-08 MED ORDER — FAMOTIDINE IN NACL 20-0.9 MG/50ML-% IV SOLN
INTRAVENOUS | Status: AC
  Filled 2017-03-08: qty 50

## 2017-03-08 MED ORDER — DIPHENHYDRAMINE HCL 25 MG PO CAPS: 25.0000 mg | ORAL_CAPSULE | Freq: Once | ORAL | Status: AC

## 2017-03-08 MED ORDER — HEPARIN SOD (PORK) LOCK FLUSH 100 UNIT/ML IV SOLN
500.0000 [IU] | Freq: Once | INTRAVENOUS | Status: AC | PRN
  Administered 2017-03-08: 500 [IU]
  Filled 2017-03-08: qty 5

## 2017-03-08 MED ORDER — SODIUM CHLORIDE 0.9 % IV SOLN
250.0000 mL | Freq: Once | INTRAVENOUS | Status: AC
  Administered 2017-03-08: 250 mL via INTRAVENOUS

## 2017-03-08 MED ORDER — SODIUM CHLORIDE 0.9 % IV SOLN
152.0000 mg | Freq: Once | INTRAVENOUS | Status: AC
  Administered 2017-03-08: 150 mg via INTRAVENOUS
  Filled 2017-03-08: qty 15

## 2017-03-08 MED ORDER — FAMOTIDINE IN NACL 20-0.9 MG/50ML-% IV SOLN
20.0000 mg | Freq: Once | INTRAVENOUS | Status: AC
  Administered 2017-03-08: 20 mg via INTRAVENOUS

## 2017-03-08 MED ORDER — SODIUM CHLORIDE 0.9 % IV SOLN
Freq: Once | INTRAVENOUS | Status: AC
  Administered 2017-03-08: 09:00:00 via INTRAVENOUS

## 2017-03-08 MED ORDER — ACETAMINOPHEN 325 MG PO TABS
ORAL_TABLET | ORAL | Status: AC
Start: 2017-03-08 — End: 2017-03-08
  Filled 2017-03-08: qty 2

## 2017-03-08 MED ORDER — SODIUM CHLORIDE 0.9% FLUSH
10.0000 mL | Freq: Once | INTRAVENOUS | Status: AC
  Administered 2017-03-08: 10 mL
  Filled 2017-03-08: qty 10

## 2017-03-08 MED ORDER — DARBEPOETIN ALFA 500 MCG/ML IJ SOSY
500.0000 ug | PREFILLED_SYRINGE | Freq: Once | INTRAMUSCULAR | Status: AC
  Administered 2017-03-08: 500 ug via SUBCUTANEOUS
  Filled 2017-03-08: qty 1

## 2017-03-08 MED ORDER — DIPHENHYDRAMINE HCL 50 MG/ML IJ SOLN
INTRAMUSCULAR | Status: AC
  Filled 2017-03-08: qty 1

## 2017-03-08 MED ORDER — DIPHENHYDRAMINE HCL 50 MG/ML IJ SOLN
50.0000 mg | Freq: Once | INTRAMUSCULAR | Status: AC
  Administered 2017-03-08: 50 mg via INTRAVENOUS

## 2017-03-08 MED ORDER — PALONOSETRON HCL INJECTION 0.25 MG/5ML
INTRAVENOUS | Status: AC
  Filled 2017-03-08: qty 5

## 2017-03-08 MED ORDER — PACLITAXEL CHEMO INJECTION 300 MG/50ML
45.0000 mg/m2 | Freq: Once | INTRAVENOUS | Status: AC
  Administered 2017-03-08: 72 mg via INTRAVENOUS
  Filled 2017-03-08: qty 12

## 2017-03-08 MED ORDER — SODIUM CHLORIDE 0.9 % IV SOLN
20.0000 mg | Freq: Once | INTRAVENOUS | Status: AC
  Administered 2017-03-08: 20 mg via INTRAVENOUS
  Filled 2017-03-08: qty 2

## 2017-03-08 MED ORDER — SODIUM CHLORIDE 0.9% FLUSH
10.0000 mL | INTRAVENOUS | Status: DC | PRN
  Administered 2017-03-08: 10 mL
  Filled 2017-03-08: qty 10

## 2017-03-08 NOTE — Patient Instructions (Signed)
[facility] Discharge Instructions for Patients Receiving Chemotherapy  Today you received the following chemotherapy agents: Taxol and Carboplatin   To help prevent nausea and vomiting after your treatment, we encourage you to take your nausea medication as directed.    If you develop nausea and vomiting that is not controlled by your nausea medication, call the clinic.   BELOW ARE SYMPTOMS THAT SHOULD BE REPORTED IMMEDIATELY:  *FEVER GREATER THAN 100.5 F  *CHILLS WITH OR WITHOUT FEVER  NAUSEA AND VOMITING THAT IS NOT CONTROLLED WITH YOUR NAUSEA MEDICATION  *UNUSUAL SHORTNESS OF BREATH  *UNUSUAL BRUISING OR BLEEDING  TENDERNESS IN MOUTH AND THROAT WITH OR WITHOUT PRESENCE OF ULCERS  *URINARY PROBLEMS  *BOWEL PROBLEMS  UNUSUAL RASH Items with * indicate a potential emergency and should be followed up as soon as possible.  Feel free to call the clinic should you have any questions or concerns. The clinic phone number is [phone].  Please show the [facility] at check-in to the Emergency Department and triage nurse.   Blood Transfusion, Adult A blood transfusion is a procedure in which you receive donated blood, including plasma, platelets, and red blood cells, through an IV tube. You may need a blood transfusion because of illness, surgery, or injury. The blood may come from a donor. You may also be able to donate blood for yourself (autologous blood donation) before a surgery if you know that you might require a blood transfusion. The blood given in a transfusion is made up of different types of cells. You may receive:  Red blood cells. These carry oxygen to the cells in the body.  White blood cells. These help you fight infections.  Platelets. These help your blood to clot.  Plasma. This is the liquid part of your blood and it helps with fluid imbalances.  If you have hemophilia or another clotting disorder, you may also receive other types of  blood products. Tell a health care provider about:  Any allergies you have.  All medicines you are taking, including vitamins, herbs, eye drops, creams, and over-the-counter medicines.  Any problems you or family members have had with anesthetic medicines.  Any blood disorders you have.  Any surgeries you have had.  Any medical conditions you have, including any recent fever or cold symptoms.  Whether you are pregnant or may be pregnant.  Any previous reactions you have had during a blood transfusion. What are the risks? Generally, this is a safe procedure. However, problems may occur, including:  Having an allergic reaction to something in the donated blood. Hives and itching may be symptoms of this type of reaction.  Fever. This may be a reaction to the white blood cells in the transfused blood. Nausea or chest pain may accompany a fever.  Iron overload. This can happen from having many transfusions.  Transfusion-related acute lung injury (TRALI). This is a rare reaction that causes lung damage. The cause is not known.TRALI can occur within hours of a transfusion or several days later.  Sudden (acute) or delayed hemolytic reactions. This happens if your blood does not match the cells in your transfusion. Your body's defense system (immune system) may try to attack the new cells. This complication is rare. The symptoms include fever, chills, nausea, and low back pain or chest pain.  Infection or disease transmission. This is rare.  What happens before the procedure?  You will have a blood test to determine your blood type. This is necessary to  know what kind of blood your body will accept and to match it to the donor blood.  If you are going to have a planned surgery, you may be able to do an autologous blood donation. This may be done in case you need to have a transfusion.  If you have had an allergic reaction to a transfusion in the past, you may be given medicine to help  prevent a reaction. This medicine may be given to you by mouth or through an IV tube.  You will have your temperature, blood pressure, and pulse monitored before the transfusion.  Follow instructions from your health care provider about eating and drinking restrictions.  Ask your health care provider about: ? Changing or stopping your regular medicines. This is especially important if you are taking diabetes medicines or blood thinners. ? Taking medicines such as aspirin and ibuprofen. These medicines can thin your blood. Do not take these medicines before your procedure if your health care provider instructs you not to. What happens during the procedure?  An IV tube will be inserted into one of your veins.  The bag of donated blood will be attached to your IV tube. The blood will then enter through your vein.  Your temperature, blood pressure, and pulse will be monitored regularly during the transfusion. This monitoring is done to detect early signs of a transfusion reaction.  If you have any signs or symptoms of a reaction, your transfusion will be stopped and you may be given medicine.  When the transfusion is complete, your IV tube will be removed.  Pressure may be applied to the IV site for a few minutes.  A bandage (dressing) will be applied. The procedure may vary among health care providers and hospitals. What happens after the procedure?  Your temperature, blood pressure, heart rate, breathing rate, and blood oxygen level will be monitored often.  Your blood may be tested to see how you are responding to the transfusion.  You may be warmed with fluids or blankets to maintain a normal body temperature. Summary  A blood transfusion is a procedure in which you receive donated blood, including plasma, platelets, and red blood cells, through an IV tube.  Your temperature, blood pressure, and pulse will be monitored before, during, and after the transfusion.  Your blood may  be tested after the transfusion to see how your body has responded. This information is not intended to replace advice given to you by your health care provider. Make sure you discuss any questions you have with your health care provider. Document Released: 04/22/2000 Document Revised: 01/21/2016 Document Reviewed: 01/21/2016 Elsevier Interactive Patient Education  [employer].

## 2017-03-08 NOTE — Progress Notes (Signed)
[place] OFFICE PROGRESS NOTE  Patient Care Team: [name], MD as PCP - General (Family Medicine) [name], MD as Consulting Physician (Hematology and Oncology)  SUMMARY OF ONCOLOGIC HISTORY: Oncology History   MDS, R-IPSS score of 3, low risk (Hg 8.9, +16 chromosome on BM, 2% blast count)   Squamous cell carcinoma of the epiglottis   Primary site: Larynx - Supraglottis (Left)   Staging method: AJCC 7th Edition   Clinical: Stage I (T1, N0, M0) signed by [name], MD on 04/30/2013 12:49 PM   Pathologic: Stage I (T1, N0, cM0) signed by [name], MD on 04/30/2013 12:49 PM   Summary: Stage I (T1, N0, cM0)       MDS (myelodysplastic syndrome), low grade ([place])   02/01/2007 Bone Marrow Biopsy    BM biopsy was abnormal, overall probable low grade MDS      03/23/2011 - 02/06/2013 Chemotherapy    [name], discontinued due to diagnosis of laryngeal ca      09/06/2013 Bone Marrow Biopsy    Repeat bone marrow aspirate and biopsy confirmed low-grade myelodysplastic syndrome.      09/17/2013 -  Chemotherapy    The patient resumed darbepoetin injections to treat the anemia.       History of head and neck cancer   03/06/2013 Procedure    Biopsy from epiglottic region showed invasive [facility]      04/25/2013 Imaging    Ct scan showed no other involvement in the LN      05/13/2013 - 06/28/2013 Radiation Therapy    He received radiation therapy, 70 gray in 35 fractions to the larynx      06/09/2016 Imaging    CT scan of the chest showed Bilateral spiculated soft tissue pulmonary masses, highly suspicious for multifocal malignancy. Borderline enlarged left-sided mediastinal lymphadenopathy. Background of severe emphysematous changes, chronic cylindrical bronchiectasis and hyperinflation of the lungs. Findings consistent with longstanding COPD. Diffusely thickened interstitial markings. This may represent chronic interstitial lung changes, however  lymphangitic spread of malignancy cannot be excluded. Anterior compression deformities of several of the thoracic vertebral bodies, with heterogeneous appearance of T7 and T9 vertebral body. This may represent degenerative changes, however osseous metastatic disease cannot be excluded.       07/11/2016 PET scan    There are 2 nodules in the left upper lobe and 2 nodules within the right lower lobe which have spiculated margin and exhibit intense radiotracer uptake compatible with multifocal pulmonary metastasis versus multiple synchronous primary pulmonary neoplasms. 2. Emphysema 3. Aortic atherosclerosis and multi vessel coronary artery calcification.       Cancer of upper lobe of left lung ([place])   06/09/2016 Imaging    CT chest: Bilateral spiculated soft tissue pulmonary masses, highly suspicious for multifocal malignancy. Borderline enlarged left-sided mediastinal lymphadenopathy. Background of severe emphysematous changes, chronic cylindrical bronchiectasis and hyperinflation of the lungs. Findings consistent with longstanding COPD. Diffusely thickened interstitial markings. This may represent chronic interstitial lung changes, however lymphangitic spread of malignancy cannot be excluded. Anterior compression deformities of several of the thoracic vertebral bodies, with heterogeneous appearance of T7 and T9 vertebral body. This may represent degenerative changes, however osseous metastatic disease cannot be excluded.       06/29/2016 Imaging    MRI brain: No evidence of metastatic disease or recent infarction. Advanced generalized brain atrophy with moderate to marked chronic small-vessel ischemic changes throughout. FLAIR imaging appears to show scattered gyri showing FLAIR hyperintensity. These abnormalities are not  confirmed with restricted diffusion or contrast enhancement. Therefore, we are not certain that this is not artifactual. The differential diagnosis does include true pathology such  as posterior reversible encephalopathy, viral or prion disease, postictal change in post chemotherapy change. If there is concern about active CNS pathology, re- scanning in 4-6 weeks could be useful.      07/27/2016 Imaging    CT chest: Bilateral pulmonary lesions. These are suspicious for metastatic disease. Lesions have minimally changed but there may be slight enlargement of the cavitary lesion in the right lower lobe. Severe emphysematous changes.       08/10/2016 Pathology Results    1. Lung, biopsy, RUL, (apical segment) - SCANT BENIGN LUNG PARENCHYMA. - THERE IS NO EVIDENCE OF MALIGNANCY. 2. Lung, biopsy, LUL lingula - SQUAMOUS CELL CARCINOMA. - SEE COMMENT. 3. Lung, biopsy, LUL - ADENOCARCINOMA. - SEE COMMENT. Microscopic Comment 2. The malignant cells are positive for cytokeratin 56 and p63. They are negative for TTF-1. The findings are consistent with squamous cell carcinoma. There is likely insufficient tissue remaining for additional studies, if requested. 3. The malignant cells are positive for TTF-1 and negative for p63 and cytokeratin 5/6. The findings are consistent with adenocarcinoma.      08/10/2016 Procedure    The targets were defined as follows: Left upper lobe nodule, target 1 Lingular nodule, target 2 Proximal right lower lobe nodule, target 3 More distal right lower lobe, nodule  The extendable working channel was secured into place and the locator guide was withdrawn. Under fluoroscopic guidance transbronchial needle brushings, transbronchial Wang needle biopsies, and transbronchial forceps biopsies were performed in the LUL (target 1) and the lingula (target 2) to be sent for cytology and pathology. A bronchioalveolar lavage was performed in the lingula in the vicinity of target 2  and sent for cytology and microbiology (bacterial, fungal, AFB smears and cultures).  Fiducial markers were then placed around target 1, target 2, and in the right lower lobe in the  vicinity of both target 3 and target 4 to be used for possible radiation therapy should this be indicated. At the end of the procedure a general airway inspection was performed and there was no evidence of active bleeding. The bronchoscope was removed.  The patient tolerated the procedure well. There was no significant blood loss and there were no obvious complications. A post-procedural chest x-ray is pending.  Samples: 1. Transbronchial needle brushings from targets 1 and 2 2. Transbronchial Wang needle biopsies from targets 1 and 2 3. Transbronchial forceps biopsies from targets 1 and 2 4. Bronchoalveolar lavage from lingula, target 2 5. Endobronchial biopsies from RUL apical segment 6. Endobronchial brushings from right upper lobe apical segment       08/10/2016 Genetic Testing    Patient has genetic testing done for [employer] and PD-L1 testing Results revealed PD-L1 testing is negative 0%      08/31/2016 Procedure    Technically successful right IJ power-injectable port catheter placement. Ready for routine use      11/30/2016 PET scan    1. Interval resection of the hypermetabolic lingular nodule. 2. The 3 other spiculated, hypermetabolic nodules within the left upper and right lower lobes are slightly smaller with decreased metabolic activity, consistent with some response to interval therapy. 3. No progressive disease      03/06/2017 PET scan    1. Mixed appearance, with significant improvement in the left upper lobe nodule ; stable appearance of the more cephalad right lower lobe  nodule; but enlargement and increased in metabolic activity of the more inferior right lower lobe nodule which appears cavitary. No new nodule. 2. Previously there was some moderate increase in diffuse marrow activity. That has increased further, and could be a response from prior granulocyte/marrow stimulation, although I cannot completely exclude the possibility that this is related to the  patient's myelodysplastic syndrome. No CT correlate. 3. Other imaging findings of potential clinical significance: Aortic Atherosclerosis (ICD10-I70.0) and Emphysema (ICD10-J43.9). Coronary atherosclerosis. Low-density blood pool suggests anemia. Chronic right upper lobe scarring. Mucus in both mainstem bronchi. Suspected left renal cysts. Indistinct but likely enlarged prostate gland.       INTERVAL HISTORY: Please see below for problem oriented charting. He returns for further follow-up and chemotherapy He has some chronic mild productive cough He continues to smoke sporadically He denies recent hemoptysis His energy level is fair but he did complain of fatigue His weight has been stable He denies peripheral neuropathy from treatment  REVIEW OF SYSTEMS:   Constitutional: Denies fevers, chills or abnormal weight loss Eyes: Denies blurriness of vision Ears, nose, mouth, throat, and face: Denies mucositis or sore throat Cardiovascular: Denies palpitation, chest discomfort or lower extremity swelling Gastrointestinal:  Denies nausea, heartburn or change in bowel habits Skin: Denies abnormal skin rashes Lymphatics: Denies new lymphadenopathy or easy bruising Neurological:Denies numbness, tingling or new weaknesses Behavioral/Psych: Mood is stable, no new changes  All other systems were reviewed with the patient and are negative.  I have reviewed the past medical history, past surgical history, social history and family history with the patient and they are unchanged from previous note.  ALLERGIES:  is allergic to no known allergies.  MEDICATIONS:  Current Outpatient Prescriptions  Medication Sig Dispense Refill  . albuterol (PROVENTIL HFA;VENTOLIN HFA) 108 (90 Base) MCG/ACT inhaler Inhale 2 puffs into the lungs every 4 (four) hours as needed for wheezing or shortness of breath. 1 Inhaler 5  . cyanocobalamin 500 MCG tablet Take 500 mcg by mouth daily.    . ferrous sulfate 325 (65 FE)  MG tablet Take 325 mg by mouth daily with breakfast.    . folic acid (FOLVITE) 1 MG tablet Take 1 mg by mouth daily.    . Glycopyrrolate-Formoterol (BEVESPI AEROSPHERE) 9-4.8 MCG/ACT AERO Inhale 2 puffs into the lungs 2 (two) times daily. 2 Inhaler 0  . levETIRAcetam (KEPPRA) 500 MG tablet Take 1 tablet (500 mg total) by mouth 2 (two) times daily. 60 tablet 3  . lidocaine-prilocaine (EMLA) cream Apply to affected area once 30 g 3  . ondansetron (ZOFRAN) 8 MG tablet Take 1 tablet (8 mg total) by mouth 2 (two) times daily as needed for refractory nausea / vomiting. Start on day 3 after chemo. 30 tablet 1  . predniSONE (DELTASONE) 10 MG tablet Take 1 tablet (10 mg total) by mouth daily with breakfast. 60 tablet 1  . predniSONE (DELTASONE) 5 MG tablet TAKE 1 TABLET (5 MG TOTAL) BY MOUTH DAILY WITH BREAKFAST. 30 tablet 1  . prochlorperazine (COMPAZINE) 10 MG tablet Take 10 mg by mouth every 6 (six) hours as needed.    . vitamin E 1000 UNIT capsule Take 1,000 Units by mouth daily.      No current facility-administered medications for this visit.    Facility-Administered Medications Ordered in Other Visits  Medication Dose Route Frequency Provider Last Rate Last Dose  . 0.9 %  sodium chloride infusion  250 mL Intravenous Once [name], MD      .  acetaminophen (TYLENOL) tablet 650 mg  650 mg Oral Once Ciel Chervenak, MD      . CARBOplatin (PARAPLATIN) 150 mg in sodium chloride 0.9 % 250 mL chemo infusion  150 mg Intravenous Once Alvy Bimler, Laelani Vasko, MD      . Darbepoetin Alfa (ARANESP) injection 500 mcg  500 mcg Subcutaneous Once Alvy Bimler, Loyed Wilmes, MD      . dexamethasone (DECADRON) 20 mg in sodium chloride 0.9 % 50 mL IVPB  20 mg Intravenous Once Alvy Bimler, Johnavon Mcclafferty, MD   20 mg at 03/08/17 0950  . diphenhydrAMINE (BENADRYL) capsule 25 mg  25 mg Oral Once Alvy Bimler, Kameren Pargas, MD      . heparin lock flush 100 unit/mL  500 Units Intracatheter Once PRN Alvy Bimler, Shalla Bulluck, MD      . PACLitaxel (TAXOL) 72 mg in sodium chloride 0.9 % 250 mL  chemo infusion (</= 74m/m2)  45 mg/m2 (Treatment Plan Recorded) Intravenous Once Lore Polka, MD      . sodium chloride flush (NS) 0.9 % injection 10 mL  10 mL Intracatheter PRN GAlvy Bimler Brittany Osier, MD        PHYSICAL EXAMINATION: ECOG PERFORMANCE STATUS: 1 - Symptomatic but completely ambulatory  Vitals:   03/08/17 0823  BP: 135/61  Pulse: 77  Resp: 18  Temp: 98 F (36.7 C)  SpO2: 100%   Filed Weights   03/08/17 0823  Weight: 109 lb 9.6 oz (49.7 kg)    GENERAL:alert, no distress and comfortable.  He looks thin and cachectic SKIN: skin color, texture, turgor are normal, no rashes or significant lesions EYES: normal, Conjunctiva are pale and non-injected, sclera clear OROPHARYNX:no exudate, no erythema and lips, buccal mucosa, and tongue normal  NECK: supple, thyroid normal size, non-tender, without nodularity LYMPH:  no palpable lymphadenopathy in the cervical, axillary or inguinal LUNGS: clear to auscultation and percussion with normal breathing effort HEART: regular rate & rhythm and no murmurs and no lower extremity edema ABDOMEN:abdomen soft, non-tender and normal bowel sounds Musculoskeletal:no cyanosis of digits with digital clubbing NEURO: alert & oriented x 3 with fluent speech, no focal motor/sensory deficits  LABORATORY DATA:  I have reviewed the data as listed    Component Value Date/Time   NA 141 03/08/2017 0754   K 4.4 03/08/2017 0754   CL 108 08/31/2016 1155   CL 107 09/19/2012 1325   CO2 21 (L) 03/08/2017 0754   GLUCOSE 86 03/08/2017 0754   GLUCOSE 97 09/19/2012 1325   BUN 17.3 03/08/2017 0754   CREATININE 0.8 03/08/2017 0754   CALCIUM 8.6 03/08/2017 0754   PROT 7.9 03/08/2017 0754   ALBUMIN 3.2 (L) 03/08/2017 0754   AST 11 03/08/2017 0754   ALT <6 03/08/2017 0754   ALKPHOS 119 03/08/2017 0754   BILITOT 0.26 03/08/2017 0754   GFRNONAA >60 08/31/2016 1155   GFRAA >60 08/31/2016 1155    No results found for: SPEP, UPEP  Lab Results  Component Value  Date   WBC 22.5 (H) 03/08/2017   NEUTROABS 20.0 (H) 03/08/2017   HGB 7.5 (L) 03/08/2017   HCT 23.7 (L) 03/08/2017   MCV 92.9 03/08/2017   PLT 169 03/08/2017      Chemistry      Component Value Date/Time   NA 141 03/08/2017 0754   K 4.4 03/08/2017 0754   CL 108 08/31/2016 1155   CL 107 09/19/2012 1325   CO2 21 (L) 03/08/2017 0754   BUN 17.3 03/08/2017 0754   CREATININE 0.8 03/08/2017 0754      Component Value  Date/Time   CALCIUM 8.6 03/08/2017 0754   ALKPHOS 119 03/08/2017 0754   AST 11 03/08/2017 0754   ALT <6 03/08/2017 0754   BILITOT 0.26 03/08/2017 0754       RADIOGRAPHIC STUDIES: I have personally reviewed imaging study with the patient I have personally reviewed the radiological images as listed and agreed with the findings in the report. Nm Pet Image Restag (ps) Skull Base To Thigh  Result Date: 03/06/2017 CLINICAL DATA:  Subsequent treatment strategy for left upper lobe lung cancer. Myelodysplastic syndrome. EXAM: NUCLEAR MEDICINE PET SKULL BASE TO THIGH TECHNIQUE: 4.7 mCi F-18 FDG was injected intravenously. Full-ring PET imaging was performed from the skull base to thigh after the radiotracer. CT data was obtained and used for attenuation correction and anatomic localization. FASTING BLOOD GLUCOSE:  Value: 81 mg/dl COMPARISON:  Multiple exams, including 11/29/2016 PET-CT FINDINGS: NECK No hypermetabolic lymph nodes in the neck. CHEST The spiculated left upper lobe nodule on image 24/ 8 measures 2.0 by 2.1 cm, and by my measurements was previously 2.4 by 2.0 cm. This lesion has a current maximum standard uptake value of 3.4, formerly 9.0. There appears to be a fiducial along its medial margin. The more cephalad of the 2 right lower lobe pulmonary nodules as spiculated margins and measures 2.3 by 2.2 cm on image 41/8 (formerly 2.3 by 2.1 cm by my measurements) and has a maximum standard uptake value of 9.7 (formerly 9.8). The more caudad lesion appears to have internal  necrosis with an air-fluid level, and measures 3.1 by 2.3 cm on image 49/8, formerly 2.2 by 1.6 cm by my measurement, and has a maximum standard uptake value of 7.4 (formerly 7.1). Several fiducials are present in the vicinity of these right lower lobe lesions. There also several fiducials in the lingula. Bandlike density in the lingula with very faint metabolic activity, maximum SUV 1.4, formerly 1.8. This is likely postoperative. No significant hypermetabolic adenopathy in the chest. Right Port-A-Cath tip: Right atrium. Aortic arch and coronary atherosclerosis. Low-density blood pool. Severe centrilobular emphysema. Chronic right upper lobe scarring. Frothy mucous in the mainstem bronchi bilaterally. ABDOMEN/PELVIS No abnormal hypermetabolic activity within the liver, pancreas, adrenal glands, or spleen. No hypermetabolic lymph nodes in the abdomen or pelvis. There is some localized activity in right lower quadrant bowel without a CT correlate, not matching any prior abnormal accentuated bowel activity on the previous exam, accordingly probably physiologic. Aortoiliac atherosclerotic vascular disease. Suspected left renal cysts. Mildly distended urinary bladder. Indistinct but likely enlarged prostate gland with punctate calcifications internally, but no significant abnormal hypermetabolic activity in the prostate. SKELETON No focal hypermetabolic activity to suggest skeletal metastasis. However, there is diffuse accentuated marrow activity throughout the skeleton, possibly due to granulocyte stimulation/marrow stimulation, although I cannot completely exclude the possibility that this is related to the patient's myelodysplastic syndrome. The degree of marrow uptake is increased from the prior exam. From an quantitative standpoint the L5 vertebral body marrow has a maximum SUV of 7.3 today, previously 5.3. No CT correlate. IMPRESSION: 1. Mixed appearance, with significant improvement in the left upper lobe nodule  ; stable appearance of the more cephalad right lower lobe nodule; but enlargement and increased in metabolic activity of the more inferior right lower lobe nodule which appears cavitary. No new nodule. 2. Previously there was some moderate increase in diffuse marrow activity. That has increased further, and could be a response from prior granulocyte/marrow stimulation, although I cannot completely exclude the possibility that this is related  to the patient's myelodysplastic syndrome. No CT correlate. 3. Other imaging findings of potential clinical significance: Aortic Atherosclerosis (ICD10-I70.0) and Emphysema (ICD10-J43.9). Coronary atherosclerosis. Low-density blood pool suggests anemia. Chronic right upper lobe scarring. Mucus in both mainstem bronchi. Suspected left renal cysts. Indistinct but likely enlarged prostate gland. Electronically Signed   By: Van Clines M.D.   On: 03/06/2017 16:39    ASSESSMENT & PLAN:  Cancer of upper lobe of left lung ([place]) I have reviewed imaging study with the patient's The patient had mixed response The right lower lobe lung nodule has progressed slightly but overall other nodules has responded to treatment He tolerated treatment fairly well I recommend we continue palliative treatment but I plan to refer him to radiation oncologist to consider palliative radiation therapy to the progressive lung nodule He agreed to proceed I plan to see him again in a few weeks time with plan to repeat imaging study in 3 months, next due around end of January 2019  MDS (myelodysplastic syndrome), low grade ([place]) He had responded well to Darbopeitin in the past with improvement in energy level.  The patient will get regular blood work done. The patient will continue on Aranep injection to keep hemoglobin greater than 10 g. He will continue to get darbepoetin injection while on treatment, scheduled to be given every 2 weeks. He had recently received blood transfusion with  symptomatic improvement.   Today, he is mildly symptomatic. We discussed some of the risks, benefits, and alternatives of blood transfusions. The patient is symptomatic from anemia and the hemoglobin level is critically low.  Some of the side-effects to be expected including risks of transfusion reactions, chills, infection, syndrome of volume overload and risk of hospitalization from various reasons and the patient is willing to proceed and went ahead to sign consent today. I plan to give him a unit of blood after chemo today He will get G-CSF support after day 14 of treatment   Orders Placed This Encounter  Procedures  . Ambulatory referral to Radiation Oncology    Referral Priority:   Routine    Referral Type:   Consultation    Referral Reason:   Specialty Services Required    Referred to Provider:   Gery Pray, MD    Requested Specialty:   Radiation Oncology    Number of Visits Requested:   1   All questions were answered. The patient knows to call the clinic with any problems, questions or concerns. No barriers to learning was detected. I spent 30 minutes counseling the patient face to face. The total time spent in the appointment was 40 minutes and more than 50% was on counseling and review of test results     [name], MD 03/08/2017 9:56 AM

## 2017-03-08 NOTE — Patient Instructions (Signed)
Implanted Port Home Guide An implanted port is a type of central line that is placed under the skin. Central lines are used to provide IV access when treatment or nutrition needs to be given through a person's veins. Implanted ports are used for long-term IV access. An implanted port may be placed because:  You need IV medicine that would be irritating to the small veins in your hands or arms.  You need long-term IV medicines, such as antibiotics.  You need IV nutrition for a long period.  You need frequent blood draws for lab tests.  You need dialysis.  Implanted ports are usually placed in the chest area, but they can also be placed in the upper arm, the abdomen, or the leg. An implanted port has two main parts:  Reservoir. The reservoir is round and will appear as a small, raised area under your skin. The reservoir is the part where a needle is inserted to give medicines or draw blood.  Catheter. The catheter is a thin, flexible tube that extends from the reservoir. The catheter is placed into a large vein. Medicine that is inserted into the reservoir goes into the catheter and then into the vein.  How will I care for my incision site? Do not get the incision site wet. Bathe or shower as directed by your health care provider. How is my port accessed? Special steps must be taken to access the port:  Before the port is accessed, a numbing cream can be placed on the skin. This helps numb the skin over the port site.  Your health care provider uses a sterile technique to access the port. ? Your health care provider must put on a mask and sterile gloves. ? The skin over your port is cleaned carefully with an antiseptic and allowed to dry. ? The port is gently pinched between sterile gloves, and a needle is inserted into the port.  Only "non-coring" port needles should be used to access the port. Once the port is accessed, a blood return should be checked. This helps ensure that the port  is in the vein and is not clogged.  If your port needs to remain accessed for a constant infusion, a clear (transparent) bandage will be placed over the needle site. The bandage and needle will need to be changed every week, or as directed by your health care provider.  Keep the bandage covering the needle clean and dry. Do not get it wet. Follow your health care provider's instructions on how to take a shower or bath while the port is accessed.  If your port does not need to stay accessed, no bandage is needed over the port.  What is flushing? Flushing helps keep the port from getting clogged. Follow your health care provider's instructions on how and when to flush the port. Ports are usually flushed with saline solution or a medicine called heparin. The need for flushing will depend on how the port is used.  If the port is used for intermittent medicines or blood draws, the port will need to be flushed: ? After medicines have been given. ? After blood has been drawn. ? As part of routine maintenance.  If a constant infusion is running, the port may not need to be flushed.  How long will my port stay implanted? The port can stay in for as long as your health care provider thinks it is needed. When it is time for the port to come out, surgery will be   done to remove it. The procedure is similar to the one performed when the port was put in. When should I seek immediate medical care? When you have an implanted port, you should seek immediate medical care if:  You notice a bad smell coming from the incision site.  You have swelling, redness, or drainage at the incision site.  You have more swelling or pain at the port site or the surrounding area.  You have a fever that is not controlled with medicine.  This information is not intended to replace advice given to you by your health care provider. Make sure you discuss any questions you have with your health care provider. Document  Released: 04/25/2005 Document Revised: 10/01/2015 Document Reviewed: 12/31/2012 Elsevier Interactive Patient Education  2017 [employer]  

## 2017-03-08 NOTE — Assessment & Plan Note (Addendum)
He had responded well to Darbopeitin in the past with improvement in energy level.  The patient will get regular blood work done. The patient will continue on Aranep injection to keep hemoglobin greater than 10 g. He will continue to get darbepoetin injection while on treatment, scheduled to be given every 2 weeks. He had recently received blood transfusion with symptomatic improvement.   Today, he is mildly symptomatic. We discussed some of the risks, benefits, and alternatives of blood transfusions. The patient is symptomatic from anemia and the hemoglobin level is critically low.  Some of the side-effects to be expected including risks of transfusion reactions, chills, infection, syndrome of volume overload and risk of hospitalization from various reasons and the patient is willing to proceed and went ahead to sign consent today. I plan to give him a unit of blood after chemo today He will get G-CSF support after day 14 of treatment

## 2017-03-08 NOTE — Assessment & Plan Note (Signed)
I have reviewed imaging study with the patient's The patient had mixed response The right lower lobe lung nodule has progressed slightly but overall other nodules has responded to treatment He tolerated treatment fairly well I recommend we continue palliative treatment but I plan to refer him to radiation oncologist to consider palliative radiation therapy to the progressive lung nodule He agreed to proceed I plan to see him again in a few weeks time with plan to repeat imaging study in 3 months, next due around end of January 2019

## 2017-03-09 ENCOUNTER — Other Ambulatory Visit: Payer: Self-pay | Admitting: Hematology and Oncology

## 2017-03-09 DIAGNOSIS — D4620 Refractory anemia with excess of blasts, unspecified: Secondary | ICD-10-CM

## 2017-03-09 LAB — BPAM RBC
BLOOD PRODUCT EXPIRATION DATE: 201811282359
ISSUE DATE / TIME: 201810311212
Unit Type and Rh: 5100

## 2017-03-09 LAB — TYPE AND SCREEN
ABO/RH(D): O POS
Antibody Screen: NEGATIVE
DONOR AG TYPE: NEGATIVE
UNIT DIVISION: 0

## 2017-03-10 ENCOUNTER — Encounter: Payer: Self-pay | Admitting: Radiation Oncology

## 2017-03-15 ENCOUNTER — Ambulatory Visit (HOSPITAL_BASED_OUTPATIENT_CLINIC_OR_DEPARTMENT_OTHER): Payer: Medicare Other

## 2017-03-15 ENCOUNTER — Ambulatory Visit: Payer: Medicare Other

## 2017-03-15 ENCOUNTER — Telehealth: Payer: Self-pay | Admitting: Hematology and Oncology

## 2017-03-15 ENCOUNTER — Other Ambulatory Visit (HOSPITAL_BASED_OUTPATIENT_CLINIC_OR_DEPARTMENT_OTHER): Payer: Medicare Other

## 2017-03-15 ENCOUNTER — Other Ambulatory Visit: Payer: Self-pay | Admitting: Hematology and Oncology

## 2017-03-15 ENCOUNTER — Ambulatory Visit (HOSPITAL_COMMUNITY)
Admission: RE | Admit: 2017-03-15 | Discharge: 2017-03-15 | Disposition: A | Discharge status: Home / Self Care | Payer: Medicare Other | Source: Ambulatory Visit | Attending: Hematology and Oncology | Admitting: Hematology and Oncology

## 2017-03-15 VITALS — BP 142/73 | HR 75 | Temp 98.3°F | Resp 20

## 2017-03-15 DIAGNOSIS — C3412 Malignant neoplasm of upper lobe, left bronchus or lung: Secondary | ICD-10-CM

## 2017-03-15 DIAGNOSIS — C7802 Secondary malignant neoplasm of left lung: Secondary | ICD-10-CM

## 2017-03-15 DIAGNOSIS — D649 Anemia, unspecified: Secondary | ICD-10-CM

## 2017-03-15 DIAGNOSIS — D4620 Refractory anemia with excess of blasts, unspecified: Secondary | ICD-10-CM

## 2017-03-15 DIAGNOSIS — Z5111 Encounter for antineoplastic chemotherapy: Secondary | ICD-10-CM | POA: Diagnosis not present

## 2017-03-15 DIAGNOSIS — C801 Malignant (primary) neoplasm, unspecified: Secondary | ICD-10-CM

## 2017-03-15 DIAGNOSIS — D61818 Other pancytopenia: Secondary | ICD-10-CM

## 2017-03-15 LAB — CBC WITH DIFFERENTIAL/PLATELET
BASO%: 0.8 % (ref 0.0–2.0)
BASOS ABS: 0.1 10*3/uL (ref 0.0–0.1)
EOS%: 0.2 % (ref 0.0–7.0)
Eosinophils Absolute: 0 10*3/uL (ref 0.0–0.5)
HCT: 23.1 % — ABNORMAL LOW (ref 38.4–49.9)
HEMOGLOBIN: 7.8 g/dL — AB (ref 13.0–17.1)
LYMPH%: 5.8 % — ABNORMAL LOW (ref 14.0–49.0)
MCH: 30.5 pg (ref 27.2–33.4)
MCHC: 33.7 g/dL (ref 32.0–36.0)
MCV: 90.5 fL (ref 79.3–98.0)
MONO#: 0.4 10*3/uL (ref 0.1–0.9)
MONO%: 5.5 % (ref 0.0–14.0)
NEUT%: 87.7 % — ABNORMAL HIGH (ref 39.0–75.0)
NEUTROS ABS: 6.5 10*3/uL (ref 1.5–6.5)
Platelets: 127 10*3/uL — ABNORMAL LOW (ref 140–400)
RBC: 2.55 10*6/uL — AB (ref 4.20–5.82)
RDW: 22.4 % — AB (ref 11.0–14.6)
WBC: 7.4 10*3/uL (ref 4.0–10.3)
lymph#: 0.4 10*3/uL — ABNORMAL LOW (ref 0.9–3.3)

## 2017-03-15 LAB — IRON AND TIBC
%SAT: 95 % — AB (ref 20–55)
Iron: 133 ug/dL (ref 42–163)
TIBC: 140 ug/dL — ABNORMAL LOW (ref 202–409)
UIBC: 7 ug/dL — AB (ref 117–376)

## 2017-03-15 LAB — COMPREHENSIVE METABOLIC PANEL
ALT: <6 U/L (ref 0–55)
AST: 12 U/L (ref 5–34)
Albumin: 3.2 g/dL — ABNORMAL LOW (ref 3.5–5.0)
Alkaline Phosphatase: 86 U/L (ref 40–150)
Anion Gap: 8 mEq/L (ref 3–11)
BUN: 23.3 mg/dL (ref 7.0–26.0)
CO2: 20 meq/L — AB (ref 22–29)
Calcium: 8.9 mg/dL (ref 8.4–10.4)
Chloride: 110 mEq/L — ABNORMAL HIGH (ref 98–109)
Creatinine: 0.8 mg/dL (ref 0.7–1.3)
GLUCOSE: 86 mg/dL (ref 70–140)
POTASSIUM: 4.8 meq/L (ref 3.5–5.1)
SODIUM: 138 meq/L (ref 136–145)
Total Bilirubin: 0.42 mg/dL (ref 0.20–1.20)
Total Protein: 7.9 g/dL (ref 6.4–8.3)

## 2017-03-15 LAB — PREPARE RBC (CROSSMATCH)

## 2017-03-15 LAB — FERRITIN

## 2017-03-15 MED ORDER — SODIUM CHLORIDE 0.9% FLUSH
10.0000 mL | INTRAVENOUS | Status: DC | PRN
  Filled 2017-03-15: qty 10

## 2017-03-15 MED ORDER — SODIUM CHLORIDE 0.9 % IV SOLN
20.0000 mg | Freq: Once | INTRAVENOUS | Status: AC
  Administered 2017-03-15: 20 mg via INTRAVENOUS
  Filled 2017-03-15: qty 2

## 2017-03-15 MED ORDER — DIPHENHYDRAMINE HCL 50 MG/ML IJ SOLN
50.0000 mg | Freq: Once | INTRAMUSCULAR | Status: AC
  Administered 2017-03-15: 50 mg via INTRAVENOUS

## 2017-03-15 MED ORDER — FAMOTIDINE IN NACL 20-0.9 MG/50ML-% IV SOLN
INTRAVENOUS | Status: AC
  Filled 2017-03-15: qty 50

## 2017-03-15 MED ORDER — SODIUM CHLORIDE 0.9 % IV SOLN
152.0000 mg | Freq: Once | INTRAVENOUS | Status: AC
  Administered 2017-03-15: 150 mg via INTRAVENOUS
  Filled 2017-03-15: qty 15

## 2017-03-15 MED ORDER — PALONOSETRON HCL INJECTION 0.25 MG/5ML
0.2500 mg | Freq: Once | INTRAVENOUS | Status: AC
  Administered 2017-03-15: 0.25 mg via INTRAVENOUS

## 2017-03-15 MED ORDER — SODIUM CHLORIDE 0.9 % IV SOLN
45.0000 mg/m2 | Freq: Once | INTRAVENOUS | Status: AC
  Administered 2017-03-15: 72 mg via INTRAVENOUS
  Filled 2017-03-15: qty 12

## 2017-03-15 MED ORDER — HEPARIN SOD (PORK) LOCK FLUSH 100 UNIT/ML IV SOLN
500.0000 [IU] | Freq: Once | INTRAVENOUS | Status: AC | PRN
  Administered 2017-03-15: 500 [IU]
  Filled 2017-03-15: qty 5

## 2017-03-15 MED ORDER — FAMOTIDINE IN NACL 20-0.9 MG/50ML-% IV SOLN
20.0000 mg | Freq: Once | INTRAVENOUS | Status: AC
Start: 2017-03-15 — End: 2017-03-15
  Administered 2017-03-15: 20 mg via INTRAVENOUS

## 2017-03-15 MED ORDER — PALONOSETRON HCL INJECTION 0.25 MG/5ML
INTRAVENOUS | Status: AC
  Filled 2017-03-15: qty 5

## 2017-03-15 MED ORDER — DIPHENHYDRAMINE HCL 50 MG/ML IJ SOLN
INTRAMUSCULAR | Status: AC
  Filled 2017-03-15: qty 1

## 2017-03-15 MED ORDER — SODIUM CHLORIDE 0.9 % IV SOLN
Freq: Once | INTRAVENOUS | Status: AC
  Administered 2017-03-15: 10:00:00 via INTRAVENOUS

## 2017-03-15 MED ORDER — SODIUM CHLORIDE 0.9% FLUSH
10.0000 mL | Freq: Once | INTRAVENOUS | Status: AC
  Administered 2017-03-15: 10 mL
  Filled 2017-03-15: qty 10

## 2017-03-15 NOTE — Patient Instructions (Signed)
Implanted Port Home Guide An implanted port is a type of central line that is placed under the skin. Central lines are used to provide IV access when treatment or nutrition needs to be given through a person's veins. Implanted ports are used for long-term IV access. An implanted port may be placed because:  You need IV medicine that would be irritating to the small veins in your hands or arms.  You need long-term IV medicines, such as antibiotics.  You need IV nutrition for a long period.  You need frequent blood draws for lab tests.  You need dialysis.  Implanted ports are usually placed in the chest area, but they can also be placed in the upper arm, the abdomen, or the leg. An implanted port has two main parts:  Reservoir. The reservoir is round and will appear as a small, raised area under your skin. The reservoir is the part where a needle is inserted to give medicines or draw blood.  Catheter. The catheter is a thin, flexible tube that extends from the reservoir. The catheter is placed into a large vein. Medicine that is inserted into the reservoir goes into the catheter and then into the vein.  How will I care for my incision site? Do not get the incision site wet. Bathe or shower as directed by your health care provider. How is my port accessed? Special steps must be taken to access the port:  Before the port is accessed, a numbing cream can be placed on the skin. This helps numb the skin over the port site.  Your health care provider uses a sterile technique to access the port. ? Your health care provider must put on a mask and sterile gloves. ? The skin over your port is cleaned carefully with an antiseptic and allowed to dry. ? The port is gently pinched between sterile gloves, and a needle is inserted into the port.  Only "non-coring" port needles should be used to access the port. Once the port is accessed, a blood return should be checked. This helps ensure that the port  is in the vein and is not clogged.  If your port needs to remain accessed for a constant infusion, a clear (transparent) bandage will be placed over the needle site. The bandage and needle will need to be changed every week, or as directed by your health care provider.  Keep the bandage covering the needle clean and dry. Do not get it wet. Follow your health care provider's instructions on how to take a shower or bath while the port is accessed.  If your port does not need to stay accessed, no bandage is needed over the port.  What is flushing? Flushing helps keep the port from getting clogged. Follow your health care provider's instructions on how and when to flush the port. Ports are usually flushed with saline solution or a medicine called heparin. The need for flushing will depend on how the port is used.  If the port is used for intermittent medicines or blood draws, the port will need to be flushed: ? After medicines have been given. ? After blood has been drawn. ? As part of routine maintenance.  If a constant infusion is running, the port may not need to be flushed.  How long will my port stay implanted? The port can stay in for as long as your health care provider thinks it is needed. When it is time for the port to come out, surgery will be   done to remove it. The procedure is similar to the one performed when the port was put in. When should I seek immediate medical care? When you have an implanted port, you should seek immediate medical care if:  You notice a bad smell coming from the incision site.  You have swelling, redness, or drainage at the incision site.  You have more swelling or pain at the port site or the surrounding area.  You have a fever that is not controlled with medicine.  This information is not intended to replace advice given to you by your health care provider. Make sure you discuss any questions you have with your health care provider. Document  Released: 04/25/2005 Document Revised: 10/01/2015 Document Reviewed: 12/31/2012 Elsevier Interactive Patient Education  2017 [employer]  

## 2017-03-15 NOTE — Telephone Encounter (Signed)
Per 11/7 schedule message scheduled 3hr blood in b group at 1:15 pm. Per message okayed by Lurlean Horns. Per message desk nurse will contact patient.

## 2017-03-15 NOTE — Patient Instructions (Signed)
[facility] Discharge Instructions for Patients Receiving Chemotherapy  Today you received the following chemotherapy agents:  Taxol & Carboplatin  To help prevent nausea and vomiting after your treatment, we encourage you to take your nausea medication.    If you develop nausea and vomiting that is not controlled by your nausea medication, call the clinic.   BELOW ARE SYMPTOMS THAT SHOULD BE REPORTED IMMEDIATELY:  *FEVER GREATER THAN 100.5 F  *CHILLS WITH OR WITHOUT FEVER  NAUSEA AND VOMITING THAT IS NOT CONTROLLED WITH YOUR NAUSEA MEDICATION  *UNUSUAL SHORTNESS OF BREATH  *UNUSUAL BRUISING OR BLEEDING  TENDERNESS IN MOUTH AND THROAT WITH OR WITHOUT PRESENCE OF ULCERS  *URINARY PROBLEMS  *BOWEL PROBLEMS  UNUSUAL RASH Items with * indicate a potential emergency and should be followed up as soon as possible.  Feel free to call the clinic should you have any questions or concerns. The clinic phone number is [phone].  Please show the [place] [facility] at check-in to the Emergency Department and triage nurse.

## 2017-03-16 LAB — ERYTHROPOIETIN: Erythropoietin: 1968 m[IU]/mL — ABNORMAL HIGH (ref 2.6–18.5)

## 2017-03-16 LAB — VITAMIN B12

## 2017-03-16 LAB — FOLATE RBC
FOLATE, HEMOLYSATE: 230.9 ng/mL
Folate, RBC: 995 ng/mL (ref >498)
Hematocrit: 23.2 % — ABNORMAL LOW (ref 37.5–51.0)

## 2017-03-17 ENCOUNTER — Ambulatory Visit: Payer: Self-pay

## 2017-03-17 ENCOUNTER — Ambulatory Visit (HOSPITAL_BASED_OUTPATIENT_CLINIC_OR_DEPARTMENT_OTHER): Payer: Medicare Other

## 2017-03-17 ENCOUNTER — Telehealth: Payer: Self-pay

## 2017-03-17 VITALS — BP 132/60 | HR 79 | Temp 98.0°F | Resp 18

## 2017-03-17 DIAGNOSIS — Z5189 Encounter for other specified aftercare: Secondary | ICD-10-CM

## 2017-03-17 DIAGNOSIS — C7802 Secondary malignant neoplasm of left lung: Secondary | ICD-10-CM

## 2017-03-17 DIAGNOSIS — C3412 Malignant neoplasm of upper lobe, left bronchus or lung: Secondary | ICD-10-CM | POA: Diagnosis not present

## 2017-03-17 DIAGNOSIS — D4620 Refractory anemia with excess of blasts, unspecified: Secondary | ICD-10-CM

## 2017-03-17 DIAGNOSIS — C801 Malignant (primary) neoplasm, unspecified: Secondary | ICD-10-CM

## 2017-03-17 MED ORDER — ACETAMINOPHEN 325 MG PO TABS
650.0000 mg | ORAL_TABLET | Freq: Once | ORAL | Status: AC
  Administered 2017-03-17: 650 mg via ORAL

## 2017-03-17 MED ORDER — SODIUM CHLORIDE 0.9% FLUSH
10.0000 mL | INTRAVENOUS | Status: AC | PRN
  Administered 2017-03-17: 10 mL
  Filled 2017-03-17: qty 10

## 2017-03-17 MED ORDER — HEPARIN SOD (PORK) LOCK FLUSH 100 UNIT/ML IV SOLN
500.0000 [IU] | Freq: Every day | INTRAVENOUS | Status: AC | PRN
Start: 2017-03-17 — End: 2017-03-17
  Administered 2017-03-17: 500 [IU]
  Filled 2017-03-17: qty 5

## 2017-03-17 MED ORDER — SODIUM CHLORIDE 0.9 % IV SOLN
250.0000 mL | Freq: Once | INTRAVENOUS | Status: AC
  Administered 2017-03-17: 250 mL via INTRAVENOUS

## 2017-03-17 MED ORDER — PEGFILGRASTIM INJECTION 6 MG/0.6ML ~~LOC~~
6.0000 mg | PREFILLED_SYRINGE | Freq: Once | SUBCUTANEOUS | Status: AC
  Administered 2017-03-17: 6 mg via SUBCUTANEOUS
  Filled 2017-03-17: qty 0.6

## 2017-03-17 MED ORDER — ACETAMINOPHEN 325 MG PO TABS
ORAL_TABLET | ORAL | Status: AC
  Filled 2017-03-17: qty 2

## 2017-03-17 MED ORDER — DIPHENHYDRAMINE HCL 25 MG PO CAPS
ORAL_CAPSULE | ORAL | Status: AC
  Filled 2017-03-17: qty 1

## 2017-03-17 MED ORDER — DIPHENHYDRAMINE HCL 25 MG PO CAPS
25.0000 mg | ORAL_CAPSULE | Freq: Once | ORAL | Status: AC
  Administered 2017-03-17: 25 mg via ORAL

## 2017-03-17 NOTE — Patient Instructions (Signed)

## 2017-03-17 NOTE — Progress Notes (Signed)
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 05/13/2013-06/28/2013 70 gray to epiglottis/neck  Pacemaker/ICD? No  Possible current pregnancy? no  Is the patient on methotrexate? No  Additional Complaints / other details: Pt denies having any pain or fatigue. Pt has cough and states that sputum is white in color. Pt denies any hemoptysis. Pt denies having any shortness of breath. Pt denies having any difficulties swallowing. Last chemotherapy treatment was last Wednesday. Pt states that he has had a 5 pound weight loss. Pt had a low blood pressure sitting and standing. Pt denies any symptoms of hypotension. Pt denies any light headedness, dizziness, or shortness of breath.  BP 97/75 (BP Location: Left Arm)   Pulse 94   Temp 97.8 F (36.6 C) (Oral)   Wt 108 lb (49 kg)   SpO2 98%   BMI 14.65 kg/m   Wt Readings from Last 3 Encounters:  03/20/17 108 lb (49 kg)  03/08/17 109 lb 9.6 oz (49.7 kg)  02/22/17 107 lb 9.6 oz (48.8 kg)

## 2017-03-17 NOTE — Telephone Encounter (Signed)
Wife called to verify appt today. Done.

## 2017-03-18 LAB — BPAM RBC
BLOOD PRODUCT EXPIRATION DATE: 201812042359
ISSUE DATE / TIME: 201811091410
UNIT TYPE AND RH: 5100

## 2017-03-18 LAB — TYPE AND SCREEN
ABO/RH(D): O POS
Antibody Screen: NEGATIVE
DONOR AG TYPE: NEGATIVE
UNIT DIVISION: 0

## 2017-03-20 ENCOUNTER — Other Ambulatory Visit: Payer: Self-pay

## 2017-03-20 ENCOUNTER — Encounter: Payer: Self-pay | Admitting: Radiation Oncology

## 2017-03-20 ENCOUNTER — Ambulatory Visit
Admission: RE | Admit: 2017-03-20 | Discharge: 2017-03-20 | Disposition: A | Discharge status: Home / Self Care | Payer: Medicare Other | Source: Ambulatory Visit | Attending: Radiation Oncology | Admitting: Radiation Oncology

## 2017-03-20 DIAGNOSIS — R918 Other nonspecific abnormal finding of lung field: Secondary | ICD-10-CM

## 2017-03-20 DIAGNOSIS — Z8521 Personal history of malignant neoplasm of larynx: Secondary | ICD-10-CM | POA: Insufficient documentation

## 2017-03-20 DIAGNOSIS — Z923 Personal history of irradiation: Secondary | ICD-10-CM | POA: Diagnosis not present

## 2017-03-20 DIAGNOSIS — C7801 Secondary malignant neoplasm of right lung: Secondary | ICD-10-CM | POA: Diagnosis not present

## 2017-03-20 DIAGNOSIS — Z510 Encounter for antineoplastic radiation therapy: Secondary | ICD-10-CM | POA: Diagnosis not present

## 2017-03-20 DIAGNOSIS — Z79899 Other long term (current) drug therapy: Secondary | ICD-10-CM | POA: Insufficient documentation

## 2017-03-20 DIAGNOSIS — C7802 Secondary malignant neoplasm of left lung: Secondary | ICD-10-CM | POA: Diagnosis not present

## 2017-03-20 DIAGNOSIS — C3412 Malignant neoplasm of upper lobe, left bronchus or lung: Secondary | ICD-10-CM

## 2017-03-20 DIAGNOSIS — Z9221 Personal history of antineoplastic chemotherapy: Secondary | ICD-10-CM | POA: Diagnosis not present

## 2017-03-20 NOTE — Progress Notes (Signed)
Radiation Oncology         [phone]) [phone] ________________________________  Name: Adrian [name] MRN: [id number]  Date: 03/20/2017  DOB: 11-May-1950  Reconsultation  Note  CC: [name], MD  [name], MD    ICD-10-CM   1. Cancer of upper lobe of left lung (HCC) C34.12   2. 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 Alvy Bimler.  After definitive treatment for the patient's squamous cell carcinoma of the epiglottis he developed pulmonary metastasis. He did undergo removal of the lingula lesion which showed squamous cell carcinoma, also underwent biopsy of one of his right lung lesions which showed adenocarcinoma. The patient has been receiving palliative carboplatinum and paclitaxel. Most recent PET scan shows a mixed response with most lesions shrinking however the tumor in the right lower lung has progressed during the interval PET scan. Radiation therapy is been consulted for consideration for palliative treatment for this progressive area  Overall the patient seems to be doing well. He does have some fatigue. His appetite is somewhat off. He denies any pain in the chest area cough or hemoptysis. The patient denies any new bony pain headaches dizziness or blurred vision.                          ALLERGIES:  is allergic to no known allergies.  Meds: Current Outpatient Medications  Medication Sig Dispense Refill  . albuterol (PROVENTIL HFA;VENTOLIN HFA) 108 (90 Base) MCG/ACT inhaler Inhale 2 puffs into the lungs every 4 (four) hours as needed for wheezing or shortness of breath. 1 Inhaler 5  . cyanocobalamin 500 MCG tablet Take 500 mcg by mouth daily.    .  ferrous sulfate 325 (65 FE) MG tablet Take 325 mg by mouth daily with breakfast.    . folic acid (FOLVITE) 1 MG tablet Take 1 mg by mouth daily.    . Glycopyrrolate-Formoterol (BEVESPI AEROSPHERE) 9-4.8 MCG/ACT AERO Inhale 2 puffs into the lungs 2 (two) times daily. 2 Inhaler 0  . levETIRAcetam (KEPPRA) 500 MG tablet Take 1 tablet (500 mg total) by mouth 2 (two) times daily. 60 tablet 3  . lidocaine-prilocaine (EMLA) cream Apply to affected area once 30 g 3  . predniSONE (DELTASONE) 10 MG tablet Take 1 tablet (10 mg total) by mouth daily with breakfast. 60 tablet 1  . vitamin E 1000 UNIT capsule Take 1,000 Units by mouth daily.     . ondansetron (ZOFRAN) 8 MG tablet Take 1 tablet (8 mg total) by mouth 2 (two) times daily as needed for refractory nausea / vomiting. Start on day 3 after chemo. (Patient not taking: Reported on 03/20/2017) 30 tablet 1  . predniSONE (DELTASONE) 5 MG tablet TAKE 1 TABLET (5 MG TOTAL) BY MOUTH DAILY WITH BREAKFAST. (Patient not taking: Reported on 03/20/2017) 30 tablet 1  . prochlorperazine (COMPAZINE) 10 MG tablet Take 10 mg by mouth every 6 (six) hours as needed.     No current facility-administered medications for this encounter.     Physical Findings: The patient is in no acute distress. Patient is alert and oriented.  weight is  108 lb (49 kg). His oral temperature is 97.8 F (36.6 C). His blood pressure is 97/75 and his pulse is 94. His oxygen saturation is 98%. .  Lungs are clear to auscultation bilaterally. Heart has regular rate and rhythm. No palpable cervical, supraclavicular, or axillary adenopathy. Abdomen soft, non-tender, normal bowel sounds. Mild radiation changes noted in the neck region. No palpable adenopathy.   Lab Findings: Lab Results  Component Value Date   WBC 7.4 03/15/2017   HGB 7.8 (L) 03/15/2017   HCT 23.2 (L) 03/15/2017   HCT 23.1 (L) 03/15/2017   MCV 90.5 03/15/2017   PLT 127 (L) 03/15/2017    Radiographic Findings: Nm Pet Image  Restag (ps) Skull Base To Thigh  Result Date: 03/06/2017 CLINICAL DATA:  Subsequent treatment strategy for left upper lobe lung cancer. Myelodysplastic syndrome. EXAM: NUCLEAR MEDICINE PET SKULL BASE TO THIGH TECHNIQUE: 4.7 mCi F-18 FDG was injected intravenously. Full-ring PET imaging was performed from the skull base to thigh after the radiotracer. CT data was obtained and used for attenuation correction and anatomic localization. FASTING BLOOD GLUCOSE:  Value: 81 mg/dl COMPARISON:  Multiple exams, including 11/29/2016 PET-CT FINDINGS: NECK No hypermetabolic lymph nodes in the neck. CHEST The spiculated left upper lobe nodule on image 24/ 8 measures 2.0 by 2.1 cm, and by my measurements was previously 2.4 by 2.0 cm. This lesion has a current maximum standard uptake value of 3.4, formerly 9.0. There appears to be a fiducial along its medial margin. The more cephalad of the 2 right lower lobe pulmonary nodules as spiculated margins and measures 2.3 by 2.2 cm on image 41/8 (formerly 2.3 by 2.1 cm by my measurements) and has a maximum standard uptake value of 9.7 (formerly 9.8). The more caudad lesion appears to have internal necrosis with an air-fluid level, and measures 3.1 by 2.3 cm on image 49/8, formerly 2.2 by 1.6 cm by my measurement, and has a maximum standard uptake value of 7.4 (formerly 7.1). Several fiducials are present in the vicinity of these right lower lobe lesions. There also several fiducials in the lingula. Bandlike density in the lingula with very faint metabolic activity, maximum SUV 1.4, formerly 1.8. This is likely postoperative. No significant hypermetabolic adenopathy in the chest. Right Port-A-Cath tip: Right atrium. Aortic arch and coronary atherosclerosis. Low-density blood pool. Severe centrilobular emphysema. Chronic right upper lobe scarring. Frothy mucous in the mainstem bronchi bilaterally. ABDOMEN/PELVIS No abnormal hypermetabolic activity within the liver, pancreas, adrenal  glands, or spleen. No hypermetabolic lymph nodes in the abdomen or pelvis. There is some localized activity in right lower quadrant bowel without a CT correlate, not matching any prior abnormal accentuated bowel activity on the previous exam, accordingly probably physiologic. Aortoiliac atherosclerotic vascular disease. Suspected left renal cysts. Mildly distended urinary bladder. Indistinct but likely enlarged prostate gland with punctate calcifications internally, but no significant abnormal hypermetabolic activity in the prostate. SKELETON No focal hypermetabolic activity to suggest skeletal metastasis. However, there is diffuse accentuated marrow activity throughout the skeleton, possibly due to granulocyte stimulation/marrow stimulation, although I cannot completely exclude the possibility that this is related to the patient's myelodysplastic syndrome. The degree of marrow uptake is increased from the prior exam. From an quantitative standpoint the L5 vertebral body marrow has a maximum SUV of 7.3 today, previously 5.3. No CT correlate. IMPRESSION: 1. Mixed appearance, with significant improvement in the left upper lobe nodule ; stable appearance of the more cephalad right lower lobe nodule; but enlargement and increased in metabolic activity  of the more inferior right lower lobe nodule which appears cavitary. No new nodule. 2. Previously there was some moderate increase in diffuse marrow activity. That has increased further, and could be a response from prior granulocyte/marrow stimulation, although I cannot completely exclude the possibility that this is related to the patient's myelodysplastic syndrome. No CT correlate. 3. Other imaging findings of potential clinical significance: Aortic Atherosclerosis (ICD10-I70.0) and Emphysema (ICD10-J43.9). Coronary atherosclerosis. Low-density blood pool suggests anemia. Chronic right upper lobe scarring. Mucus in both mainstem bronchi. Suspected left renal cysts.  Indistinct but likely enlarged prostate gland. Electronically Signed   By: Van Clines M.D.   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 Patient will be a good candidate for hypofractionated or stereotactic body radiation therapy directed at his progressive lesion in the right lower lung area. I discussed course of treatment side effects and potential toxicities of radiation therapy in this situation with the patient and his wife. He appears to understand wishes to proceed with planned course of treatment. Patient has an additional lesion close to his progressive lesion I may encompass that in his radiation field depending on results of planning  Plan:  Patient will be scheduled for SBRT planning in the near future. I anticipate between 5 and 10 radiation treatments.  ____________________________________ Gery Pray, MD

## 2017-03-22 ENCOUNTER — Ambulatory Visit
Admission: RE | Admit: 2017-03-22 | Discharge: 2017-03-22 | Disposition: A | Discharge status: Home / Self Care | Payer: Medicare Other | Source: Ambulatory Visit | Attending: Radiation Oncology | Admitting: Radiation Oncology

## 2017-03-22 DIAGNOSIS — Z510 Encounter for antineoplastic radiation therapy: Secondary | ICD-10-CM | POA: Diagnosis not present

## 2017-03-22 DIAGNOSIS — C3412 Malignant neoplasm of upper lobe, left bronchus or lung: Secondary | ICD-10-CM

## 2017-03-22 DIAGNOSIS — C3431 Malignant neoplasm of lower lobe, right bronchus or lung: Secondary | ICD-10-CM | POA: Diagnosis not present

## 2017-03-22 NOTE — Progress Notes (Signed)
  Radiation Oncology         [phone] ________________________________  Name: Adrian [name] MRN: [id number]  Date: 03/22/2017  DOB: 05/20/1950   STEREOTACTIC BODY RADIOTHERAPY SIMULATION AND TREATMENT PLANNING NOTE  DIAGNOSIS:  Cancer Staging Cancer of upper lobe of left lung ([facility]) Staging form: Lung, AJCC 8th Edition - Clinical stage from 08/17/2016: Stage IVA (cT4(m), cN0, cM1a) - Unsigned  History of head and neck cancer Staging form: Larynx - Supraglottis, AJCC 7th Edition - Clinical stage from 04/30/2013: Stage I (T1, N0, M0) - Signed by [name], MD on 04/30/2013 - Pathologic: Stage I (T1, N0, cM0) - Signed by [name], MD on 04/30/2013  NARRATIVE:  The patient was brought to the [facility].  Identity was confirmed.  All relevant records and images related to the planned course of therapy were reviewed.  The patient freely provided informed written consent to proceed with treatment after reviewing the details related to the planned course of therapy. The consent form was witnessed and verified by the simulation staff.  Then, the patient was set-up in a stable reproducible  supine position for radiation therapy.  A BodyFix immobilization pillow was fabricated for reproducible positioning.  Then I personally applied the abdominal compression paddle to limit respiratory excursion.  4D respiratoy motion management CT images were obtained.  Surface markings were placed.  The CT images were loaded into the planning software.  Then, using Cine, MIP, and standard views, the internal target volume (ITV) and planning target volumes (PTV) were delinieated, and avoidance structures were contoured.  Treatment planning then occurred.  The radiation prescription was entered and confirmed.  A total of two complex treatment devices were fabricated in the form of the BodyFix immobilization pillow and a neck accuform cushion.  I have requested : 3D Simulation  I have requested a  DVH of the following structures: Heart, Lungs, Esophagus, Chest Wall, Brachial Plexus, Major Blood Vessels, and targets.  PLAN:  The patient will receive 50 Gy in 5-10  fractions directed at his progressive lesion in the right lower lung area.     -----------------------------------  Blair Promise, PhD, MD  This document serves as a record of services personally performed by Gery Pray, MD. It was created on his behalf by Reola Mosher, a trained medical scribe. The creation of this record is based on the scribe's personal observations and the provider's statements to them. This document has been checked and approved by the attending provider.

## 2017-03-28 DIAGNOSIS — Z510 Encounter for antineoplastic radiation therapy: Secondary | ICD-10-CM | POA: Diagnosis not present

## 2017-03-28 DIAGNOSIS — C3431 Malignant neoplasm of lower lobe, right bronchus or lung: Secondary | ICD-10-CM | POA: Diagnosis not present

## 2017-03-29 ENCOUNTER — Other Ambulatory Visit (HOSPITAL_BASED_OUTPATIENT_CLINIC_OR_DEPARTMENT_OTHER): Payer: Medicare Other

## 2017-03-29 ENCOUNTER — Ambulatory Visit (HOSPITAL_BASED_OUTPATIENT_CLINIC_OR_DEPARTMENT_OTHER): Payer: Medicare Other

## 2017-03-29 VITALS — BP 126/71 | HR 72 | Temp 97.9°F | Resp 16

## 2017-03-29 DIAGNOSIS — D649 Anemia, unspecified: Secondary | ICD-10-CM

## 2017-03-29 DIAGNOSIS — D4620 Refractory anemia with excess of blasts, unspecified: Secondary | ICD-10-CM | POA: Diagnosis not present

## 2017-03-29 DIAGNOSIS — C7802 Secondary malignant neoplasm of left lung: Secondary | ICD-10-CM

## 2017-03-29 DIAGNOSIS — C3412 Malignant neoplasm of upper lobe, left bronchus or lung: Secondary | ICD-10-CM | POA: Diagnosis not present

## 2017-03-29 DIAGNOSIS — Z5111 Encounter for antineoplastic chemotherapy: Secondary | ICD-10-CM | POA: Diagnosis not present

## 2017-03-29 DIAGNOSIS — C801 Malignant (primary) neoplasm, unspecified: Secondary | ICD-10-CM

## 2017-03-29 LAB — CBC WITH DIFFERENTIAL/PLATELET
BASO%: 0.2 % (ref 0.0–2.0)
Basophils Absolute: 0 10*3/uL (ref 0.0–0.1)
EOS%: 0.2 % (ref 0.0–7.0)
Eosinophils Absolute: 0 10*3/uL (ref 0.0–0.5)
HCT: 25 % — ABNORMAL LOW (ref 38.4–49.9)
HGB: 8 g/dL — ABNORMAL LOW (ref 13.0–17.1)
LYMPH%: 4.4 % — AB (ref 14.0–49.0)
MCH: 30.1 pg (ref 27.2–33.4)
MCHC: 32 g/dL (ref 32.0–36.0)
MCV: 94 fL (ref 79.3–98.0)
MONO#: 1.2 10*3/uL — ABNORMAL HIGH (ref 0.1–0.9)
MONO%: 6.4 % (ref 0.0–14.0)
NEUT%: 88.8 % — ABNORMAL HIGH (ref 39.0–75.0)
NEUTROS ABS: 16.2 10*3/uL — AB (ref 1.5–6.5)
Platelets: 165 10*3/uL (ref 140–400)
RBC: 2.66 10*6/uL — AB (ref 4.20–5.82)
RDW: 23.3 % — ABNORMAL HIGH (ref 11.0–14.6)
WBC: 18.2 10*3/uL — AB (ref 4.0–10.3)
lymph#: 0.8 10*3/uL — ABNORMAL LOW (ref 0.9–3.3)

## 2017-03-29 LAB — COMPREHENSIVE METABOLIC PANEL
ALBUMIN: 3.1 g/dL — AB (ref 3.5–5.0)
ALK PHOS: 115 U/L (ref 40–150)
AST: 10 U/L (ref 5–34)
Anion Gap: 8 mEq/L (ref 3–11)
BILIRUBIN TOTAL: 0.33 mg/dL (ref 0.20–1.20)
BUN: 19.3 mg/dL (ref 7.0–26.0)
CALCIUM: 8.4 mg/dL (ref 8.4–10.4)
CO2: 21 mEq/L — ABNORMAL LOW (ref 22–29)
CREATININE: 0.8 mg/dL (ref 0.7–1.3)
Chloride: 111 mEq/L — ABNORMAL HIGH (ref 98–109)
EGFR: >60 mL/min/{1.73_m2} (ref >60)
Glucose: 93 mg/dl (ref 70–140)
Potassium: 4.2 mEq/L (ref 3.5–5.1)
Sodium: 140 mEq/L (ref 136–145)
Total Protein: 8 g/dL (ref 6.4–8.3)

## 2017-03-29 LAB — PREPARE RBC (CROSSMATCH)

## 2017-03-29 MED ORDER — SODIUM CHLORIDE 0.9 % IV SOLN
45.0000 mg/m2 | Freq: Once | INTRAVENOUS | Status: AC
  Administered 2017-03-29: 72 mg via INTRAVENOUS
  Filled 2017-03-29: qty 12

## 2017-03-29 MED ORDER — SODIUM CHLORIDE 0.9 % IV SOLN
152.0000 mg | Freq: Once | INTRAVENOUS | Status: AC
  Administered 2017-03-29: 150 mg via INTRAVENOUS
  Filled 2017-03-29: qty 15

## 2017-03-29 MED ORDER — DIPHENHYDRAMINE HCL 50 MG/ML IJ SOLN
50.0000 mg | Freq: Once | INTRAMUSCULAR | Status: AC
  Administered 2017-03-29: 50 mg via INTRAVENOUS

## 2017-03-29 MED ORDER — DARBEPOETIN ALFA 500 MCG/ML IJ SOSY
500.0000 ug | PREFILLED_SYRINGE | Freq: Once | INTRAMUSCULAR | Status: AC
  Administered 2017-03-29: 500 ug via SUBCUTANEOUS
  Filled 2017-03-29: qty 1

## 2017-03-29 MED ORDER — DIPHENHYDRAMINE HCL 50 MG/ML IJ SOLN
INTRAMUSCULAR | Status: AC
  Filled 2017-03-29: qty 1

## 2017-03-29 MED ORDER — FAMOTIDINE IN NACL 20-0.9 MG/50ML-% IV SOLN
20.0000 mg | Freq: Once | INTRAVENOUS | Status: AC
  Administered 2017-03-29: 20 mg via INTRAVENOUS

## 2017-03-29 MED ORDER — HEPARIN SOD (PORK) LOCK FLUSH 100 UNIT/ML IV SOLN
500.0000 [IU] | Freq: Every day | INTRAVENOUS | Status: AC | PRN
  Administered 2017-03-29: 500 [IU]
  Filled 2017-03-29: qty 5

## 2017-03-29 MED ORDER — SODIUM CHLORIDE 0.9 % IV SOLN
Freq: Once | INTRAVENOUS | Status: AC
  Administered 2017-03-29: 10:00:00 via INTRAVENOUS

## 2017-03-29 MED ORDER — FAMOTIDINE IN NACL 20-0.9 MG/50ML-% IV SOLN
INTRAVENOUS | Status: AC
  Filled 2017-03-29: qty 50

## 2017-03-29 MED ORDER — ACETAMINOPHEN 325 MG PO TABS
650.0000 mg | ORAL_TABLET | Freq: Once | ORAL | Status: AC
  Administered 2017-03-29: 650 mg via ORAL

## 2017-03-29 MED ORDER — SODIUM CHLORIDE 0.9% FLUSH
10.0000 mL | INTRAVENOUS | Status: AC | PRN
  Administered 2017-03-29: 10 mL
  Filled 2017-03-29: qty 10

## 2017-03-29 MED ORDER — SODIUM CHLORIDE 0.9 % IV SOLN
20.0000 mg | Freq: Once | INTRAVENOUS | Status: AC
  Administered 2017-03-29: 20 mg via INTRAVENOUS
  Filled 2017-03-29: qty 2

## 2017-03-29 MED ORDER — PALONOSETRON HCL INJECTION 0.25 MG/5ML
INTRAVENOUS | Status: AC
  Filled 2017-03-29: qty 5

## 2017-03-29 MED ORDER — PALONOSETRON HCL INJECTION 0.25 MG/5ML
0.2500 mg | Freq: Once | INTRAVENOUS | Status: AC
  Administered 2017-03-29: 0.25 mg via INTRAVENOUS

## 2017-03-29 MED ORDER — ACETAMINOPHEN 325 MG PO TABS
ORAL_TABLET | ORAL | Status: AC
  Filled 2017-03-29: qty 2

## 2017-03-29 MED ORDER — DIPHENHYDRAMINE HCL 25 MG PO CAPS
25.0000 mg | ORAL_CAPSULE | Freq: Once | ORAL | Status: DC
Start: 2017-03-29 — End: 2017-03-29

## 2017-03-29 NOTE — Patient Instructions (Signed)
Man Discharge Instructions for Patients Receiving Chemotherapy  Today you received the following chemotherapy agents:  Taxol and Carboplatin.  To help prevent nausea and vomiting after your treatment, we encourage you to take your nausea medication as directed.   If you develop nausea and vomiting that is not controlled by your nausea medication, call the clinic.   BELOW ARE SYMPTOMS THAT SHOULD BE REPORTED IMMEDIATELY:  *FEVER GREATER THAN 100.5 F  *CHILLS WITH OR WITHOUT FEVER  NAUSEA AND VOMITING THAT IS NOT CONTROLLED WITH YOUR NAUSEA MEDICATION  *UNUSUAL SHORTNESS OF BREATH  *UNUSUAL BRUISING OR BLEEDING  TENDERNESS IN MOUTH AND THROAT WITH OR WITHOUT PRESENCE OF ULCERS  *URINARY PROBLEMS  *BOWEL PROBLEMS  UNUSUAL RASH Items with * indicate a potential emergency and should be followed up as soon as possible.  Feel free to call the clinic should you have any questions or concerns. The clinic phone number is [phone].  Please show the [facility] at check-in to the Emergency Department and triage nurse.   Blood Transfusion, Adult A blood transfusion is a procedure in which you receive donated blood, including plasma, platelets, and red blood cells, through an IV tube. You may need a blood transfusion because of illness, surgery, or injury. The blood may come from a donor. You may also be able to donate blood for yourself (autologous blood donation) before a surgery if you know that you might require a blood transfusion. The blood given in a transfusion is made up of different types of cells. You may receive:  Red blood cells. These carry oxygen to the cells in the body.  White blood cells. These help you fight infections.  Platelets. These help your blood to clot.  Plasma. This is the liquid part of your blood and it helps with fluid imbalances.  If you have hemophilia or another clotting disorder, you may also receive other types of  blood products. Tell a health care provider about:  Any allergies you have.  All medicines you are taking, including vitamins, herbs, eye drops, creams, and over-the-counter medicines.  Any problems you or family members have had with anesthetic medicines.  Any blood disorders you have.  Any surgeries you have had.  Any medical conditions you have, including any recent fever or cold symptoms.  Whether you are pregnant or may be pregnant.  Any previous reactions you have had during a blood transfusion. What are the risks? Generally, this is a safe procedure. However, problems may occur, including:  Having an allergic reaction to something in the donated blood. Hives and itching may be symptoms of this type of reaction.  Fever. This may be a reaction to the white blood cells in the transfused blood. Nausea or chest pain may accompany a fever.  Iron overload. This can happen from having many transfusions.  Transfusion-related acute lung injury (TRALI). This is a rare reaction that causes lung damage. The cause is not known.TRALI can occur within hours of a transfusion or several days later.  Sudden (acute) or delayed hemolytic reactions. This happens if your blood does not match the cells in your transfusion. Your body's defense system (immune system) may try to attack the new cells. This complication is rare. The symptoms include fever, chills, nausea, and low back pain or chest pain.  Infection or disease transmission. This is rare.  What happens before the procedure?  You will have a blood test to determine your blood type. This is necessary to know  what kind of blood your body will accept and to match it to the donor blood.  If you are going to have a planned surgery, you may be able to do an autologous blood donation. This may be done in case you need to have a transfusion.  If you have had an allergic reaction to a transfusion in the past, you may be given medicine to help  prevent a reaction. This medicine may be given to you by mouth or through an IV tube.  You will have your temperature, blood pressure, and pulse monitored before the transfusion.  Follow instructions from your health care provider about eating and drinking restrictions.  Ask your health care provider about: ? Changing or stopping your regular medicines. This is especially important if you are taking diabetes medicines or blood thinners. ? Taking medicines such as aspirin and ibuprofen. These medicines can thin your blood. Do not take these medicines before your procedure if your health care provider instructs you not to. What happens during the procedure?  An IV tube will be inserted into one of your veins.  The bag of donated blood will be attached to your IV tube. The blood will then enter through your vein.  Your temperature, blood pressure, and pulse will be monitored regularly during the transfusion. This monitoring is done to detect early signs of a transfusion reaction.  If you have any signs or symptoms of a reaction, your transfusion will be stopped and you may be given medicine.  When the transfusion is complete, your IV tube will be removed.  Pressure may be applied to the IV site for a few minutes.  A bandage (dressing) will be applied. The procedure may vary among health care providers and hospitals. What happens after the procedure?  Your temperature, blood pressure, heart rate, breathing rate, and blood oxygen level will be monitored often.  Your blood may be tested to see how you are responding to the transfusion.  You may be warmed with fluids or blankets to maintain a normal body temperature. Summary  A blood transfusion is a procedure in which you receive donated blood, including plasma, platelets, and red blood cells, through an IV tube.  Your temperature, blood pressure, and pulse will be monitored before, during, and after the transfusion.  Your blood may  be tested after the transfusion to see how your body has responded. This information is not intended to replace advice given to you by your health care provider. Make sure you discuss any questions you have with your health care provider. Document Released: 04/22/2000 Document Revised: 01/21/2016 Document Reviewed: 01/21/2016 Elsevier Interactive Patient Education  [employer].

## 2017-03-30 LAB — BPAM RBC
Blood Product Expiration Date: 201812042359
ISSUE DATE / TIME: 201811211211
UNIT TYPE AND RH: 5100

## 2017-03-30 LAB — TYPE AND SCREEN
ABO/RH(D): O POS
ANTIBODY SCREEN: NEGATIVE
DONOR AG TYPE: NEGATIVE
Unit division: 0

## 2017-04-04 ENCOUNTER — Ambulatory Visit: Payer: Medicare Other | Admitting: Radiation Oncology

## 2017-04-05 ENCOUNTER — Ambulatory Visit (HOSPITAL_BASED_OUTPATIENT_CLINIC_OR_DEPARTMENT_OTHER): Payer: Medicare Other

## 2017-04-05 ENCOUNTER — Other Ambulatory Visit: Payer: Self-pay | Admitting: Hematology and Oncology

## 2017-04-05 ENCOUNTER — Ambulatory Visit: Payer: Medicare Other | Admitting: Radiation Oncology

## 2017-04-05 ENCOUNTER — Ambulatory Visit: Payer: Medicare Other

## 2017-04-05 ENCOUNTER — Other Ambulatory Visit (HOSPITAL_BASED_OUTPATIENT_CLINIC_OR_DEPARTMENT_OTHER): Payer: Medicare Other

## 2017-04-05 ENCOUNTER — Ambulatory Visit (HOSPITAL_BASED_OUTPATIENT_CLINIC_OR_DEPARTMENT_OTHER): Payer: Medicare Other | Admitting: Hematology and Oncology

## 2017-04-05 ENCOUNTER — Telehealth: Payer: Self-pay | Admitting: Hematology and Oncology

## 2017-04-05 VITALS — BP 121/62 | HR 72 | Temp 98.9°F | Resp 17

## 2017-04-05 VITALS — BP 115/90 | HR 90 | Temp 97.7°F | Resp 16 | Ht 72.0 in | Wt 107.4 lb

## 2017-04-05 DIAGNOSIS — D4620 Refractory anemia with excess of blasts, unspecified: Secondary | ICD-10-CM

## 2017-04-05 DIAGNOSIS — D649 Anemia, unspecified: Secondary | ICD-10-CM

## 2017-04-05 DIAGNOSIS — C7802 Secondary malignant neoplasm of left lung: Secondary | ICD-10-CM

## 2017-04-05 DIAGNOSIS — C3412 Malignant neoplasm of upper lobe, left bronchus or lung: Secondary | ICD-10-CM | POA: Diagnosis not present

## 2017-04-05 DIAGNOSIS — R64 Cachexia: Secondary | ICD-10-CM | POA: Diagnosis not present

## 2017-04-05 DIAGNOSIS — D696 Thrombocytopenia, unspecified: Secondary | ICD-10-CM | POA: Diagnosis not present

## 2017-04-05 DIAGNOSIS — Z5111 Encounter for antineoplastic chemotherapy: Secondary | ICD-10-CM

## 2017-04-05 DIAGNOSIS — C801 Malignant (primary) neoplasm, unspecified: Secondary | ICD-10-CM

## 2017-04-05 LAB — CBC WITH DIFFERENTIAL/PLATELET
BASO%: 0.5 % (ref 0.0–2.0)
Basophils Absolute: 0 10*3/uL (ref 0.0–0.1)
EOS%: 0.1 % (ref 0.0–7.0)
Eosinophils Absolute: 0 10*3/uL (ref 0.0–0.5)
HEMATOCRIT: 25.8 % — AB (ref 38.4–49.9)
HEMOGLOBIN: 8.4 g/dL — AB (ref 13.0–17.1)
LYMPH#: 0.7 10*3/uL — AB (ref 0.9–3.3)
LYMPH%: 9.1 % — ABNORMAL LOW (ref 14.0–49.0)
MCH: 30.4 pg (ref 27.2–33.4)
MCHC: 32.6 g/dL (ref 32.0–36.0)
MCV: 93.5 fL (ref 79.3–98.0)
MONO#: 0.6 10*3/uL (ref 0.1–0.9)
MONO%: 7.1 % (ref 0.0–14.0)
NEUT#: 6.7 10*3/uL — ABNORMAL HIGH (ref 1.5–6.5)
NEUT%: 83.2 % — ABNORMAL HIGH (ref 39.0–75.0)
Platelets: 123 10*3/uL — ABNORMAL LOW (ref 140–400)
RBC: 2.76 10*6/uL — ABNORMAL LOW (ref 4.20–5.82)
RDW: 21 % — AB (ref 11.0–14.6)
WBC: 8 10*3/uL (ref 4.0–10.3)

## 2017-04-05 LAB — COMPREHENSIVE METABOLIC PANEL
ALT: 6 U/L (ref 0–55)
ANION GAP: 7 meq/L (ref 3–11)
AST: 10 U/L (ref 5–34)
Albumin: 3.2 g/dL — ABNORMAL LOW (ref 3.5–5.0)
Alkaline Phosphatase: 86 U/L (ref 40–150)
BUN: 26 mg/dL (ref 7.0–26.0)
CALCIUM: 9.1 mg/dL (ref 8.4–10.4)
CHLORIDE: 110 meq/L — AB (ref 98–109)
CO2: 21 mEq/L — ABNORMAL LOW (ref 22–29)
CREATININE: 0.8 mg/dL (ref 0.7–1.3)
Glucose: 80 mg/dl (ref 70–140)
Potassium: 5 mEq/L (ref 3.5–5.1)
Sodium: 137 mEq/L (ref 136–145)
Total Bilirubin: 0.42 mg/dL (ref 0.20–1.20)
Total Protein: 8.2 g/dL (ref 6.4–8.3)

## 2017-04-05 LAB — PREPARE RBC (CROSSMATCH)

## 2017-04-05 MED ORDER — PALONOSETRON HCL INJECTION 0.25 MG/5ML
INTRAVENOUS | Status: AC
  Filled 2017-04-05: qty 5

## 2017-04-05 MED ORDER — SODIUM CHLORIDE 0.9 % IV SOLN
20.0000 mg | Freq: Once | INTRAVENOUS | Status: AC
  Administered 2017-04-05: 20 mg via INTRAVENOUS
  Filled 2017-04-05: qty 2

## 2017-04-05 MED ORDER — HEPARIN SOD (PORK) LOCK FLUSH 100 UNIT/ML IV SOLN
500.0000 [IU] | Freq: Every day | INTRAVENOUS | Status: AC | PRN
  Administered 2017-04-05: 500 [IU]
  Filled 2017-04-05: qty 5

## 2017-04-05 MED ORDER — DIPHENHYDRAMINE HCL 25 MG PO CAPS
ORAL_CAPSULE | ORAL | Status: AC
  Filled 2017-04-05: qty 2

## 2017-04-05 MED ORDER — ACETAMINOPHEN 325 MG PO TABS
ORAL_TABLET | ORAL | Status: AC
  Filled 2017-04-05: qty 2

## 2017-04-05 MED ORDER — SODIUM CHLORIDE 0.9 % IV SOLN
152.0000 mg | Freq: Once | INTRAVENOUS | Status: AC
  Administered 2017-04-05: 150 mg via INTRAVENOUS
  Filled 2017-04-05: qty 15

## 2017-04-05 MED ORDER — SODIUM CHLORIDE 0.9 % IV SOLN
45.0000 mg/m2 | Freq: Once | INTRAVENOUS | Status: AC
  Administered 2017-04-05: 72 mg via INTRAVENOUS
  Filled 2017-04-05: qty 12

## 2017-04-05 MED ORDER — ACETAMINOPHEN 325 MG PO TABS
650.0000 mg | ORAL_TABLET | Freq: Once | ORAL | Status: AC
  Administered 2017-04-05: 650 mg via ORAL

## 2017-04-05 MED ORDER — FAMOTIDINE IN NACL 20-0.9 MG/50ML-% IV SOLN
20.0000 mg | Freq: Once | INTRAVENOUS | Status: AC
  Administered 2017-04-05: 20 mg via INTRAVENOUS

## 2017-04-05 MED ORDER — DIPHENHYDRAMINE HCL 25 MG PO CAPS
50.0000 mg | ORAL_CAPSULE | Freq: Once | ORAL | Status: AC
  Administered 2017-04-05: 50 mg via ORAL

## 2017-04-05 MED ORDER — SODIUM CHLORIDE 0.9 % IV SOLN
250.0000 mL | Freq: Once | INTRAVENOUS | Status: AC
  Administered 2017-04-05: 250 mL via INTRAVENOUS

## 2017-04-05 MED ORDER — FAMOTIDINE IN NACL 20-0.9 MG/50ML-% IV SOLN
INTRAVENOUS | Status: AC
  Filled 2017-04-05: qty 50

## 2017-04-05 MED ORDER — PALONOSETRON HCL INJECTION 0.25 MG/5ML
0.2500 mg | Freq: Once | INTRAVENOUS | Status: AC
  Administered 2017-04-05: 0.25 mg via INTRAVENOUS

## 2017-04-05 MED ORDER — SODIUM CHLORIDE 0.9% FLUSH
10.0000 mL | INTRAVENOUS | Status: AC | PRN
  Administered 2017-04-05: 10 mL
  Filled 2017-04-05: qty 10

## 2017-04-05 NOTE — Telephone Encounter (Signed)
Gave avs and calendar for December  °

## 2017-04-05 NOTE — Progress Notes (Signed)
Per Dr. Alvy Bimler, patient to receive 1u PRBC as well as Taxol/Carbo today.   Wylene Simmer, BSN, RN 04/05/2017 10:21 AM

## 2017-04-05 NOTE — Patient Instructions (Signed)
[facility] Discharge Instructions for Patients Receiving Chemotherapy  Today you received the following chemotherapy agents:  Taxol and Carboplatin.  To help prevent nausea and vomiting after your treatment, we encourage you to take your nausea medication as directed.   If you develop nausea and vomiting that is not controlled by your nausea medication, call the clinic.   BELOW ARE SYMPTOMS THAT SHOULD BE REPORTED IMMEDIATELY:  *FEVER GREATER THAN 100.5 F  *CHILLS WITH OR WITHOUT FEVER  NAUSEA AND VOMITING THAT IS NOT CONTROLLED WITH YOUR NAUSEA MEDICATION  *UNUSUAL SHORTNESS OF BREATH  *UNUSUAL BRUISING OR BLEEDING  TENDERNESS IN MOUTH AND THROAT WITH OR WITHOUT PRESENCE OF ULCERS  *URINARY PROBLEMS  *BOWEL PROBLEMS  UNUSUAL RASH Items with * indicate a potential emergency and should be followed up as soon as possible.  Feel free to call the clinic should you have any questions or concerns. The clinic phone number is [phone].  Please show the [facility] at check-in to the Emergency Department and triage nurse.   Blood Transfusion, Adult A blood transfusion is a procedure in which you receive donated blood, including plasma, platelets, and red blood cells, through an IV tube. You may need a blood transfusion because of illness, surgery, or injury. The blood may come from a donor. You may also be able to donate blood for yourself (autologous blood donation) before a surgery if you know that you might require a blood transfusion. The blood given in a transfusion is made up of different types of cells. You may receive:  Red blood cells. These carry oxygen to the cells in the body.  White blood cells. These help you fight infections.  Platelets. These help your blood to clot.  Plasma. This is the liquid part of your blood and it helps with fluid imbalances.  If you have hemophilia or another clotting disorder, you may also receive other types of  blood products. Tell a health care provider about:  Any allergies you have.  All medicines you are taking, including vitamins, herbs, eye drops, creams, and over-the-counter medicines.  Any problems you or family members have had with anesthetic medicines.  Any blood disorders you have.  Any surgeries you have had.  Any medical conditions you have, including any recent fever or cold symptoms.  Whether you are pregnant or may be pregnant.  Any previous reactions you have had during a blood transfusion. What are the risks? Generally, this is a safe procedure. However, problems may occur, including:  Having an allergic reaction to something in the donated blood. Hives and itching may be symptoms of this type of reaction.  Fever. This may be a reaction to the white blood cells in the transfused blood. Nausea or chest pain may accompany a fever.  Iron overload. This can happen from having many transfusions.  Transfusion-related acute lung injury (TRALI). This is a rare reaction that causes lung damage. The cause is not known.TRALI can occur within hours of a transfusion or several days later.  Sudden (acute) or delayed hemolytic reactions. This happens if your blood does not match the cells in your transfusion. Your body's defense system (immune system) may try to attack the new cells. This complication is rare. The symptoms include fever, chills, nausea, and low back pain or chest pain.  Infection or disease transmission. This is rare.  What happens before the procedure?  You will have a blood test to determine your blood type. This is necessary to know  what kind of blood your body will accept and to match it to the donor blood.  If you are going to have a planned surgery, you may be able to do an autologous blood donation. This may be done in case you need to have a transfusion.  If you have had an allergic reaction to a transfusion in the past, you may be given medicine to help  prevent a reaction. This medicine may be given to you by mouth or through an IV tube.  You will have your temperature, blood pressure, and pulse monitored before the transfusion.  Follow instructions from your health care provider about eating and drinking restrictions.  Ask your health care provider about: ? Changing or stopping your regular medicines. This is especially important if you are taking diabetes medicines or blood thinners. ? Taking medicines such as aspirin and ibuprofen. These medicines can thin your blood. Do not take these medicines before your procedure if your health care provider instructs you not to. What happens during the procedure?  An IV tube will be inserted into one of your veins.  The bag of donated blood will be attached to your IV tube. The blood will then enter through your vein.  Your temperature, blood pressure, and pulse will be monitored regularly during the transfusion. This monitoring is done to detect early signs of a transfusion reaction.  If you have any signs or symptoms of a reaction, your transfusion will be stopped and you may be given medicine.  When the transfusion is complete, your IV tube will be removed.  Pressure may be applied to the IV site for a few minutes.  A bandage (dressing) will be applied. The procedure may vary among health care providers and hospitals. What happens after the procedure?  Your temperature, blood pressure, heart rate, breathing rate, and blood oxygen level will be monitored often.  Your blood may be tested to see how you are responding to the transfusion.  You may be warmed with fluids or blankets to maintain a normal body temperature. Summary  A blood transfusion is a procedure in which you receive donated blood, including plasma, platelets, and red blood cells, through an IV tube.  Your temperature, blood pressure, and pulse will be monitored before, during, and after the transfusion.  Your blood may  be tested after the transfusion to see how your body has responded. This information is not intended to replace advice given to you by your health care provider. Make sure you discuss any questions you have with your health care provider. Document Released: 04/22/2000 Document Revised: 01/21/2016 Document Reviewed: 01/21/2016 Elsevier Interactive Patient Education  [employer].

## 2017-04-06 ENCOUNTER — Ambulatory Visit
Admission: RE | Admit: 2017-04-06 | Discharge: 2017-04-06 | Disposition: A | Discharge status: Home / Self Care | Payer: Medicare Other | Source: Ambulatory Visit | Attending: Radiation Oncology | Admitting: Radiation Oncology

## 2017-04-06 ENCOUNTER — Ambulatory Visit: Payer: Medicare Other | Admitting: Radiation Oncology

## 2017-04-06 DIAGNOSIS — Z510 Encounter for antineoplastic radiation therapy: Secondary | ICD-10-CM | POA: Diagnosis not present

## 2017-04-06 DIAGNOSIS — C3412 Malignant neoplasm of upper lobe, left bronchus or lung: Secondary | ICD-10-CM

## 2017-04-06 LAB — BPAM RBC
BLOOD PRODUCT EXPIRATION DATE: 201812202359
ISSUE DATE / TIME: 201811281126
Unit Type and Rh: 5100

## 2017-04-06 LAB — TYPE AND SCREEN
ABO/RH(D): O POS
Antibody Screen: NEGATIVE
DONOR AG TYPE: NEGATIVE
UNIT DIVISION: 0

## 2017-04-06 NOTE — Progress Notes (Signed)
  Radiation Oncology         [phone]) [phone] ________________________________  Name: Adrian [name] MRN: [id number]  Date: 04/06/2017  DOB: Sep 03, 1950  Stereotactic Body Radiotherapy Treatment Procedure Note  NARRATIVE:  Adrian [name] was brought to the stereotactic radiation treatment machine and placed supine on the CT couch. The patient was set up for stereotactic body radiotherapy on the body fix pillow.  3D TREATMENT PLANNING AND DOSIMETRY:  The patient's radiation plan was reviewed and approved prior to starting treatment.  It showed 3-dimensional radiation distributions overlaid onto the planning CT.  The [facility] for the target structures as well as the organs at risk were reviewed. The documentation of this is filed in the radiation oncology EMR.  SIMULATION VERIFICATION:  The patient underwent CT imaging on the treatment unit.  These were carefully aligned to document that the ablative radiation dose would cover the target volume and maximally spare the nearby organs at risk according to the planned distribution.  SPECIAL TREATMENT PROCEDURE: Adrian [name] [facility] received high dose ablative stereotactic body radiotherapy to the planned target volume without unforeseen complications. Treatment was delivered uneventfully. The high doses associated with stereotactic body radiotherapy and the significant potential risks require careful treatment set up and patient monitoring constituting a special treatment procedure   STEREOTACTIC TREATMENT MANAGEMENT:  Following delivery, the patient was evaluated clinically. The patient tolerated treatment without significant acute effects, and was discharged to home in stable condition.    PLAN: Continue treatment as planned.  ________________________________  Blair Promise, PhD, MD

## 2017-04-07 ENCOUNTER — Ambulatory Visit (HOSPITAL_BASED_OUTPATIENT_CLINIC_OR_DEPARTMENT_OTHER): Payer: Medicare Other

## 2017-04-07 ENCOUNTER — Encounter: Payer: Self-pay | Admitting: Hematology and Oncology

## 2017-04-07 ENCOUNTER — Ambulatory Visit: Payer: Medicare Other | Admitting: Radiation Oncology

## 2017-04-07 VITALS — BP 124/71 | HR 95 | Temp 98.6°F | Resp 18

## 2017-04-07 DIAGNOSIS — C3412 Malignant neoplasm of upper lobe, left bronchus or lung: Secondary | ICD-10-CM | POA: Diagnosis not present

## 2017-04-07 DIAGNOSIS — C7802 Secondary malignant neoplasm of left lung: Secondary | ICD-10-CM

## 2017-04-07 DIAGNOSIS — C801 Malignant (primary) neoplasm, unspecified: Secondary | ICD-10-CM

## 2017-04-07 DIAGNOSIS — Z5189 Encounter for other specified aftercare: Secondary | ICD-10-CM

## 2017-04-07 MED ORDER — PEGFILGRASTIM INJECTION 6 MG/0.6ML ~~LOC~~
6.0000 mg | PREFILLED_SYRINGE | Freq: Once | SUBCUTANEOUS | Status: AC
  Administered 2017-04-07: 6 mg via SUBCUTANEOUS
  Filled 2017-04-07: qty 0.6

## 2017-04-07 NOTE — Patient Instructions (Signed)
Pegfilgrastim injection What is this medicine? PEGFILGRASTIM (PEG fil gra stim) is a long-acting granulocyte colony-stimulating factor that stimulates the growth of neutrophils, a type of white blood cell important in the body's fight against infection. It is used to reduce the incidence of fever and infection in patients with certain types of cancer who are receiving chemotherapy that affects the bone marrow, and to increase survival after being exposed to high doses of radiation. This medicine may be used for other purposes; ask your health care provider or pharmacist if you have questions. COMMON BRAND NAME(S): Neulasta What should I tell my health care provider before I take this medicine? They need to know if you have any of these conditions: -kidney disease -latex allergy -ongoing radiation therapy -sickle cell disease -skin reactions to acrylic adhesives (On-Body Injector only) -an unusual or allergic reaction to pegfilgrastim, filgrastim, other medicines, foods, dyes, or preservatives -pregnant or trying to get pregnant -breast-feeding How should I use this medicine? This medicine is for injection under the skin. If you get this medicine at home, you will be taught how to prepare and give the pre-filled syringe or how to use the On-body Injector. Refer to the patient Instructions for Use for detailed instructions. Use exactly as directed. Tell your healthcare provider immediately if you suspect that the On-body Injector may not have performed as intended or if you suspect the use of the On-body Injector resulted in a missed or partial dose. It is important that you put your used needles and syringes in a special sharps container. Do not put them in a trash can. If you do not have a sharps container, call your pharmacist or healthcare provider to get one. Talk to your pediatrician regarding the use of this medicine in children. While this drug may be prescribed for selected conditions,  precautions do apply. Overdosage: If you think you have taken too much of this medicine contact a poison control center or emergency room at once. NOTE: This medicine is only for you. Do not share this medicine with others. What if I miss a dose? It is important not to miss your dose. Call your doctor or health care professional if you miss your dose. If you miss a dose due to an On-body Injector failure or leakage, a new dose should be administered as soon as possible using a single prefilled syringe for manual use. What may interact with this medicine? Interactions have not been studied. Give your health care provider a list of all the medicines, herbs, non-prescription drugs, or dietary supplements you use. Also tell them if you smoke, drink alcohol, or use illegal drugs. Some items may interact with your medicine. This list may not describe all possible interactions. Give your health care provider a list of all the medicines, herbs, non-prescription drugs, or dietary supplements you use. Also tell them if you smoke, drink alcohol, or use illegal drugs. Some items may interact with your medicine. What should I watch for while using this medicine? You may need blood work done while you are taking this medicine. If you are going to need a MRI, CT scan, or other procedure, tell your doctor that you are using this medicine (On-Body Injector only). What side effects may I notice from receiving this medicine? Side effects that you should report to your doctor or health care professional as soon as possible: -allergic reactions like skin rash, itching or hives, swelling of the face, lips, or tongue -dizziness -fever -pain, redness, or irritation at site   where injected -pinpoint red spots on the skin -red or dark-brown urine -shortness of breath or breathing problems -stomach or side pain, or pain at the shoulder -swelling -tiredness -trouble passing urine or change in the amount of urine Side  effects that usually do not require medical attention (report to your doctor or health care professional if they continue or are bothersome): -bone pain -muscle pain This list may not describe all possible side effects. Call your doctor for medical advice about side effects. You may report side effects to FDA at 1-800-FDA-1088. Where should I keep my medicine? Keep out of the reach of children. Store pre-filled syringes in a refrigerator between 2 and 8 degrees C (36 and 46 degrees F). Do not freeze. Keep in carton to protect from light. Throw away this medicine if it is left out of the refrigerator for more than 48 hours. Throw away any unused medicine after the expiration date. NOTE: This sheet is a summary. It may not cover all possible information. If you have questions about this medicine, talk to your doctor, pharmacist, or health care provider.  2018 Elsevier/Gold Standard (2016-04-21 12:58:03)  

## 2017-04-07 NOTE — Progress Notes (Signed)
[facility] OFFICE PROGRESS NOTE  Patient Care Team: [name], MD as PCP - General (Family Medicine) [name], MD as Consulting Physician (Hematology and Oncology)  SUMMARY OF ONCOLOGIC HISTORY: Oncology History   MDS, R-IPSS score of 3, low risk (Hg 8.9, +16 chromosome on BM, 2% blast count)   Squamous cell carcinoma of the epiglottis   Primary site: Larynx - Supraglottis (Left)   Staging method: AJCC 7th Edition   Clinical: Stage I (T1, N0, M0) signed by [name], MD on 04/30/2013 12:49 PM   Pathologic: Stage I (T1, N0, cM0) signed by [name], MD on 04/30/2013 12:49 PM   Summary: Stage I (T1, N0, cM0)       MDS (myelodysplastic syndrome), low grade ([place])   02/01/2007 Bone Marrow Biopsy    BM biopsy was abnormal, overall probable low grade MDS      03/23/2011 - 02/06/2013 Chemotherapy    [name], discontinued due to diagnosis of laryngeal ca      09/06/2013 Bone Marrow Biopsy    Repeat bone marrow aspirate and biopsy confirmed low-grade myelodysplastic syndrome.      09/17/2013 -  Chemotherapy    The patient resumed darbepoetin injections to treat the anemia.       History of head and neck cancer   03/06/2013 Procedure    Biopsy from epiglottic region showed invasive [facility]      04/25/2013 Imaging    Ct scan showed no other involvement in the LN      05/13/2013 - 06/28/2013 Radiation Therapy    He received radiation therapy, 70 gray in 35 fractions to the larynx      06/09/2016 Imaging    CT scan of the chest showed Bilateral spiculated soft tissue pulmonary masses, highly suspicious for multifocal malignancy. Borderline enlarged left-sided mediastinal lymphadenopathy. Background of severe emphysematous changes, chronic cylindrical bronchiectasis and hyperinflation of the lungs. Findings consistent with longstanding COPD. Diffusely thickened interstitial markings. This may represent chronic interstitial lung changes, however  lymphangitic spread of malignancy cannot be excluded. Anterior compression deformities of several of the thoracic vertebral bodies, with heterogeneous appearance of T7 and T9 vertebral body. This may represent degenerative changes, however osseous metastatic disease cannot be excluded.       07/11/2016 PET scan    There are 2 nodules in the left upper lobe and 2 nodules within the right lower lobe which have spiculated margin and exhibit intense radiotracer uptake compatible with multifocal pulmonary metastasis versus multiple synchronous primary pulmonary neoplasms. 2. Emphysema 3. Aortic atherosclerosis and multi vessel coronary artery calcification.       Cancer of upper lobe of left lung ([place])   06/09/2016 Imaging    CT chest: Bilateral spiculated soft tissue pulmonary masses, highly suspicious for multifocal malignancy. Borderline enlarged left-sided mediastinal lymphadenopathy. Background of severe emphysematous changes, chronic cylindrical bronchiectasis and hyperinflation of the lungs. Findings consistent with longstanding COPD. Diffusely thickened interstitial markings. This may represent chronic interstitial lung changes, however lymphangitic spread of malignancy cannot be excluded. Anterior compression deformities of several of the thoracic vertebral bodies, with heterogeneous appearance of T7 and T9 vertebral body. This may represent degenerative changes, however osseous metastatic disease cannot be excluded.       06/29/2016 Imaging    MRI brain: No evidence of metastatic disease or recent infarction. Advanced generalized brain atrophy with moderate to marked chronic small-vessel ischemic changes throughout. FLAIR imaging appears to show scattered gyri showing FLAIR hyperintensity. These abnormalities are not  confirmed with restricted diffusion or contrast enhancement. Therefore, we are not certain that this is not artifactual. The differential diagnosis does include true pathology such  as posterior reversible encephalopathy, viral or prion disease, postictal change in post chemotherapy change. If there is concern about active CNS pathology, re- scanning in 4-6 weeks could be useful.      07/27/2016 Imaging    CT chest: Bilateral pulmonary lesions. These are suspicious for metastatic disease. Lesions have minimally changed but there may be slight enlargement of the cavitary lesion in the right lower lobe. Severe emphysematous changes.       08/10/2016 Pathology Results    1. Lung, biopsy, RUL, (apical segment) - SCANT BENIGN LUNG PARENCHYMA. - THERE IS NO EVIDENCE OF MALIGNANCY. 2. Lung, biopsy, LUL lingula - SQUAMOUS CELL CARCINOMA. - SEE COMMENT. 3. Lung, biopsy, LUL - ADENOCARCINOMA. - SEE COMMENT. Microscopic Comment 2. The malignant cells are positive for cytokeratin 56 and p63. They are negative for TTF-1. The findings are consistent with squamous cell carcinoma. There is likely insufficient tissue remaining for additional studies, if requested. 3. The malignant cells are positive for TTF-1 and negative for p63 and cytokeratin 5/6. The findings are consistent with adenocarcinoma.      08/10/2016 Procedure    The targets were defined as follows: Left upper lobe nodule, target 1 Lingular nodule, target 2 Proximal right lower lobe nodule, target 3 More distal right lower lobe, nodule  The extendable working channel was secured into place and the locator guide was withdrawn. Under fluoroscopic guidance transbronchial needle brushings, transbronchial Wang needle biopsies, and transbronchial forceps biopsies were performed in the LUL (target 1) and the lingula (target 2) to be sent for cytology and pathology. A bronchioalveolar lavage was performed in the lingula in the vicinity of target 2  and sent for cytology and microbiology (bacterial, fungal, AFB smears and cultures).  Fiducial markers were then placed around target 1, target 2, and in the right lower lobe in the  vicinity of both target 3 and target 4 to be used for possible radiation therapy should this be indicated. At the end of the procedure a general airway inspection was performed and there was no evidence of active bleeding. The bronchoscope was removed.  The patient tolerated the procedure well. There was no significant blood loss and there were no obvious complications. A post-procedural chest x-ray is pending.  Samples: 1. Transbronchial needle brushings from targets 1 and 2 2. Transbronchial Wang needle biopsies from targets 1 and 2 3. Transbronchial forceps biopsies from targets 1 and 2 4. Bronchoalveolar lavage from lingula, target 2 5. Endobronchial biopsies from RUL apical segment 6. Endobronchial brushings from right upper lobe apical segment       08/10/2016 Genetic Testing    Patient has genetic testing done for [employer] and PD-L1 testing Results revealed PD-L1 testing is negative 0%      08/31/2016 Procedure    Technically successful right IJ power-injectable port catheter placement. Ready for routine use      11/30/2016 PET scan    1. Interval resection of the hypermetabolic lingular nodule. 2. The 3 other spiculated, hypermetabolic nodules within the left upper and right lower lobes are slightly smaller with decreased metabolic activity, consistent with some response to interval therapy. 3. No progressive disease      03/06/2017 PET scan    1. Mixed appearance, with significant improvement in the left upper lobe nodule ; stable appearance of the more cephalad right lower lobe  nodule; but enlargement and increased in metabolic activity of the more inferior right lower lobe nodule which appears cavitary. No new nodule. 2. Previously there was some moderate increase in diffuse marrow activity. That has increased further, and could be a response from prior granulocyte/marrow stimulation, although I cannot completely exclude the possibility that this is related to the  patient's myelodysplastic syndrome. No CT correlate. 3. Other imaging findings of potential clinical significance: Aortic Atherosclerosis (ICD10-I70.0) and Emphysema (ICD10-J43.9). Coronary atherosclerosis. Low-density blood pool suggests anemia. Chronic right upper lobe scarring. Mucus in both mainstem bronchi. Suspected left renal cysts. Indistinct but likely enlarged prostate gland.       INTERVAL HISTORY: Please see below for problem oriented charting. He returns for further chemotherapy He continued to have mild productive cough He denies hemoptysis Radiation therapy has not been started due to insurance issue He denies peripheral neuropathy He complain of fatigue but denies chest pain or shortness of breath The patient denies any recent signs or symptoms of bleeding such as spontaneous epistaxis, hematuria or hematochezia.  REVIEW OF SYSTEMS:   Constitutional: Denies fevers, chills or abnormal weight loss Eyes: Denies blurriness of vision Ears, nose, mouth, throat, and face: Denies mucositis or sore throat Cardiovascular: Denies palpitation, chest discomfort or lower extremity swelling Gastrointestinal:  Denies nausea, heartburn or change in bowel habits Skin: Denies abnormal skin rashes Lymphatics: Denies new lymphadenopathy or easy bruising Neurological:Denies numbness, tingling or new weaknesses Behavioral/Psych: Mood is stable, no new changes  All other systems were reviewed with the patient and are negative.  I have reviewed the past medical history, past surgical history, social history and family history with the patient and they are unchanged from previous note.  ALLERGIES:  is allergic to no known allergies.  MEDICATIONS:  Current Outpatient Medications  Medication Sig Dispense Refill  . albuterol (PROVENTIL HFA;VENTOLIN HFA) 108 (90 Base) MCG/ACT inhaler Inhale 2 puffs into the lungs every 4 (four) hours as needed for wheezing or shortness of breath. 1 Inhaler 5  .  cyanocobalamin 500 MCG tablet Take 500 mcg by mouth daily.    . ferrous sulfate 325 (65 FE) MG tablet Take 325 mg by mouth daily with breakfast.    . folic acid (FOLVITE) 1 MG tablet Take 1 mg by mouth daily.    . Glycopyrrolate-Formoterol (BEVESPI AEROSPHERE) 9-4.8 MCG/ACT AERO Inhale 2 puffs into the lungs 2 (two) times daily. 2 Inhaler 0  . levETIRAcetam (KEPPRA) 500 MG tablet Take 1 tablet (500 mg total) by mouth 2 (two) times daily. 60 tablet 3  . lidocaine-prilocaine (EMLA) cream Apply to affected area once 30 g 3  . ondansetron (ZOFRAN) 8 MG tablet Take 1 tablet (8 mg total) by mouth 2 (two) times daily as needed for refractory nausea / vomiting. Start on day 3 after chemo. (Patient not taking: Reported on 03/20/2017) 30 tablet 1  . predniSONE (DELTASONE) 10 MG tablet Take 1 tablet (10 mg total) by mouth daily with breakfast. 60 tablet 1  . predniSONE (DELTASONE) 5 MG tablet TAKE 1 TABLET (5 MG TOTAL) BY MOUTH DAILY WITH BREAKFAST. (Patient not taking: Reported on 03/20/2017) 30 tablet 1  . prochlorperazine (COMPAZINE) 10 MG tablet Take 10 mg by mouth every 6 (six) hours as needed.    . vitamin E 1000 UNIT capsule Take 1,000 Units by mouth daily.      No current facility-administered medications for this visit.     PHYSICAL EXAMINATION: ECOG PERFORMANCE STATUS: 2 - Symptomatic, <50% confined to bed  Vitals:   04/05/17 0909  BP: 115/90  Pulse: 90  Resp: 16  Temp: 97.7 F (36.5 C)   Filed Weights   04/05/17 0909  Weight: 107 lb 6.4 oz (48.7 kg)    GENERAL:alert, no distress and comfortable.  He looks thin and cachectic SKIN: skin color, texture, turgor are normal, no rashes or significant lesions EYES: normal, Conjunctiva are pale and non-injected, sclera clear OROPHARYNX:no exudate, no erythema and lips, buccal mucosa, and tongue normal  NECK: supple, thyroid normal size, non-tender, without nodularity LYMPH:  no palpable lymphadenopathy in the cervical, axillary or  inguinal LUNGS: clear to auscultation and percussion with normal breathing effort HEART: regular rate & rhythm and no murmurs and no lower extremity edema ABDOMEN:abdomen soft, non-tender and normal bowel sounds Musculoskeletal:no cyanosis of digits with digital clubbing NEURO: alert & oriented x 3 with fluent speech, no focal motor/sensory deficits  LABORATORY DATA:  I have reviewed the data as listed    Component Value Date/Time   NA 137 04/05/2017 0934   K 5.0 04/05/2017 0934   CL 108 08/31/2016 1155   CL 107 09/19/2012 1325   CO2 21 (L) 04/05/2017 0934   GLUCOSE 80 04/05/2017 0934   GLUCOSE 97 09/19/2012 1325   BUN 26.0 04/05/2017 0934   CREATININE 0.8 04/05/2017 0934   CALCIUM 9.1 04/05/2017 0934   PROT 8.2 04/05/2017 0934   ALBUMIN 3.2 (L) 04/05/2017 0934   AST 10 04/05/2017 0934   ALT 6 04/05/2017 0934   ALKPHOS 86 04/05/2017 0934   BILITOT 0.42 04/05/2017 0934   GFRNONAA >60 08/31/2016 1155   GFRAA >60 08/31/2016 1155    No results found for: SPEP, UPEP  Lab Results  Component Value Date   WBC 8.0 04/05/2017   NEUTROABS 6.7 (H) 04/05/2017   HGB 8.4 (L) 04/05/2017   HCT 25.8 (L) 04/05/2017   MCV 93.5 04/05/2017   PLT 123 (L) 04/05/2017      Chemistry      Component Value Date/Time   NA 137 04/05/2017 0934   K 5.0 04/05/2017 0934   CL 108 08/31/2016 1155   CL 107 09/19/2012 1325   CO2 21 (L) 04/05/2017 0934   BUN 26.0 04/05/2017 0934   CREATININE 0.8 04/05/2017 0934      Component Value Date/Time   CALCIUM 9.1 04/05/2017 0934   ALKPHOS 86 04/05/2017 0934   AST 10 04/05/2017 0934   ALT 6 04/05/2017 0934   BILITOT 0.42 04/05/2017 0934      ASSESSMENT & PLAN:  Cancer of upper lobe of left lung ([facility]) Recent imaging study showed mixed response The right lower lobe lung nodule has progressed slightly but overall other nodules has responded to treatment He tolerated treatment fairly well I recommend we continue palliative treatment along with  palliative radiation therapy to the progressive lung nodule He agreed to proceed  MDS (myelodysplastic syndrome), low grade ([place]) He had responded well to Darbopeitin in the past with improvement in energy level.  The patient will get regular blood work done. The patient will continue on Aranep injection to keep hemoglobin greater than 10 g. He will continue to get darbepoetin injection while on treatment, scheduled to be given every 2 weeks. He had recently received blood transfusion with symptomatic improvement.   Today, he is mildly symptomatic. We discussed some of the risks, benefits, and alternatives of blood transfusions. The patient is symptomatic from anemia and the hemoglobin level is critically low.  Some of the side-effects to be  expected including risks of transfusion reactions, chills, infection, syndrome of volume overload and risk of hospitalization from various reasons and the patient is willing to proceed and went ahead to sign consent today. I plan to give him a unit of blood after chemo today He will get G-CSF support after day 14 of treatment  Thrombocytopenia ([place]) This is likely due to recent treatment. The patient denies recent history of bleeding such as epistaxis, hematuria or hematochezia. He is asymptomatic from the low platelet count. I will observe for now.  he does not require transfusion now. I will continue the chemotherapy at current dose without dosage adjustment.  If the thrombocytopenia gets progressive worse in the future, I might have to delay his treatment or adjust the chemotherapy dose.  Malignant cachexia ([place]) His appetite is improved. He is doing well on low-dose prednisone therapy Continue the same   Orders Placed This Encounter  Procedures  . Comprehensive metabolic panel    Standing Status:   Standing    Number of Occurrences:   22    Standing Expiration Date:   04/05/2018   All questions were answered. The patient knows to call the clinic  with any problems, questions or concerns. No barriers to learning was detected. I spent 25 minutes counseling the patient face to face. The total time spent in the appointment was 30 minutes and more than 50% was on counseling and review of test results     [name], MD 04/07/2017 4:50 PM

## 2017-04-07 NOTE — Assessment & Plan Note (Signed)
This is likely due to recent treatment. The patient denies recent history of bleeding such as epistaxis, hematuria or hematochezia. He is asymptomatic from the low platelet count. I will observe for now.  he does not require transfusion now. I will continue the chemotherapy at current dose without dosage adjustment.  If the thrombocytopenia gets progressive worse in the future, I might have to delay his treatment or adjust the chemotherapy dose.

## 2017-04-07 NOTE — Assessment & Plan Note (Signed)
His appetite is improved. He is doing well on low-dose prednisone therapy Continue the same

## 2017-04-07 NOTE — Assessment & Plan Note (Signed)
He had responded well to Darbopeitin in the past with improvement in energy level.  The patient will get regular blood work done. The patient will continue on Aranep injection to keep hemoglobin greater than 10 g. He will continue to get darbepoetin injection while on treatment, scheduled to be given every 2 weeks. He had recently received blood transfusion with symptomatic improvement.   Today, he is mildly symptomatic. We discussed some of the risks, benefits, and alternatives of blood transfusions. The patient is symptomatic from anemia and the hemoglobin level is critically low.  Some of the side-effects to be expected including risks of transfusion reactions, chills, infection, syndrome of volume overload and risk of hospitalization from various reasons and the patient is willing to proceed and went ahead to sign consent today. I plan to give him a unit of blood after chemo today He will get G-CSF support after day 14 of treatment

## 2017-04-07 NOTE — Assessment & Plan Note (Addendum)
Recent imaging study showed mixed response The right lower lobe lung nodule has progressed slightly but overall other nodules has responded to treatment He tolerated treatment fairly well I recommend we continue palliative treatment along with palliative radiation therapy to the progressive lung nodule He agreed to proceed

## 2017-04-10 ENCOUNTER — Ambulatory Visit
Admission: RE | Admit: 2017-04-10 | Discharge: 2017-04-10 | Disposition: A | Discharge status: Home / Self Care | Payer: Medicare Other | Source: Ambulatory Visit | Attending: Radiation Oncology | Admitting: Radiation Oncology

## 2017-04-10 DIAGNOSIS — C3412 Malignant neoplasm of upper lobe, left bronchus or lung: Secondary | ICD-10-CM

## 2017-04-10 DIAGNOSIS — Z510 Encounter for antineoplastic radiation therapy: Secondary | ICD-10-CM | POA: Diagnosis not present

## 2017-04-10 NOTE — Progress Notes (Signed)
  Radiation Oncology         [phone]) [phone] ________________________________  Name: Adrian [name] MRN: [id number]  Date: 04/10/2017  DOB: 03-Oct-1950  Stereotactic Body Radiotherapy Treatment Procedure Note  NARRATIVE:  Adrian [name] was brought to the stereotactic radiation treatment machine and placed supine on the CT couch. The patient was set up for stereotactic body radiotherapy on the body fix pillow.  3D TREATMENT PLANNING AND DOSIMETRY:  The patient's radiation plan was reviewed and approved prior to starting treatment.  It showed 3-dimensional radiation distributions overlaid onto the planning CT.  The [facility] for the target structures as well as the organs at risk were reviewed. The documentation of this is filed in the radiation oncology EMR.  SIMULATION VERIFICATION:  The patient underwent CT imaging on the treatment unit.  These were carefully aligned to document that the ablative radiation dose would cover the target volume and maximally spare the nearby organs at risk according to the planned distribution.  SPECIAL TREATMENT PROCEDURE: Adrian [name] [facility] received high dose ablative stereotactic body radiotherapy to the planned target volume without unforeseen complications. Treatment was delivered uneventfully. The high doses associated with stereotactic body radiotherapy and the significant potential risks require careful treatment set up and patient monitoring constituting a special treatment procedure   STEREOTACTIC TREATMENT MANAGEMENT:  Following delivery, the patient was evaluated clinically. The patient tolerated treatment without significant acute effects, and was discharged to home in stable condition.    PLAN: Continue treatment as planned.  ________________________________  Adrian Promise, PhD, MD  This document serves as a record of services personally performed by Gery Pray, MD. It was created on his behalf by Reola Mosher, a trained medical scribe. The  creation of this record is based on the scribe's personal observations and the provider's statements to them. This document has been checked and approved by the attending provider.

## 2017-04-11 ENCOUNTER — Ambulatory Visit: Payer: Medicare Other | Admitting: Radiation Oncology

## 2017-04-12 ENCOUNTER — Ambulatory Visit
Admission: RE | Admit: 2017-04-12 | Discharge: 2017-04-12 | Disposition: A | Discharge status: Home / Self Care | Payer: Medicare Other | Source: Ambulatory Visit | Attending: Radiation Oncology | Admitting: Radiation Oncology

## 2017-04-12 DIAGNOSIS — C3412 Malignant neoplasm of upper lobe, left bronchus or lung: Secondary | ICD-10-CM

## 2017-04-12 DIAGNOSIS — Z510 Encounter for antineoplastic radiation therapy: Secondary | ICD-10-CM | POA: Diagnosis not present

## 2017-04-12 NOTE — Progress Notes (Signed)
  Radiation Oncology         [phone]) [phone] ________________________________  Name: Adrian [name] MRN: [id number]  Date: 04/12/2017  DOB: March 27, 1951  Stereotactic Body Radiotherapy Treatment Procedure Note  NARRATIVE:  Adrian [name] was brought to the stereotactic radiation treatment machine and placed supine on the CT couch. The patient was set up for stereotactic body radiotherapy on the body fix pillow.  3D TREATMENT PLANNING AND DOSIMETRY:  The patient's radiation plan was reviewed and approved prior to starting treatment.  It showed 3-dimensional radiation distributions overlaid onto the planning CT.  The [facility] for the target structures as well as the organs at risk were reviewed. The documentation of this is filed in the radiation oncology EMR.  SIMULATION VERIFICATION:  The patient underwent CT imaging on the treatment unit.  These were carefully aligned to document that the ablative radiation dose would cover the target volume and maximally spare the nearby organs at risk according to the planned distribution.  SPECIAL TREATMENT PROCEDURE: Adrian [name] Adrian [name] received high dose ablative stereotactic body radiotherapy to the planned target volume without unforeseen complications. Treatment was delivered uneventfully. The high doses associated with stereotactic body radiotherapy and the significant potential risks require careful treatment set up and patient monitoring constituting a special treatment procedure   STEREOTACTIC TREATMENT MANAGEMENT:  Following delivery, the patient was evaluated clinically. The patient tolerated treatment without significant acute effects, and was discharged to home in stable condition.    PLAN: Continue treatment as planned.  ________________________________  Blair Promise, PhD, MD   This document serves as a record of services personally performed by Gery Pray, MD. It was created on his behalf by Valeta Harms, a trained medical scribe. The  creation of this record is based on the scribe's personal observations and the provider's statements to them. This document has been checked and approved by the attending provider.

## 2017-04-13 ENCOUNTER — Ambulatory Visit: Payer: Medicare Other | Admitting: Radiation Oncology

## 2017-04-14 ENCOUNTER — Ambulatory Visit
Admission: RE | Admit: 2017-04-14 | Discharge: 2017-04-14 | Disposition: A | Discharge status: Home / Self Care | Payer: Medicare Other | Source: Ambulatory Visit | Attending: Radiation Oncology | Admitting: Radiation Oncology

## 2017-04-14 DIAGNOSIS — Z510 Encounter for antineoplastic radiation therapy: Secondary | ICD-10-CM | POA: Diagnosis not present

## 2017-04-17 ENCOUNTER — Ambulatory Visit: Payer: Medicare Other | Admitting: Radiation Oncology

## 2017-04-18 ENCOUNTER — Encounter: Payer: Self-pay | Admitting: Radiation Oncology

## 2017-04-18 ENCOUNTER — Ambulatory Visit
Admission: RE | Admit: 2017-04-18 | Discharge: 2017-04-18 | Disposition: A | Discharge status: Home / Self Care | Payer: Medicare Other | Source: Ambulatory Visit | Attending: Radiation Oncology | Admitting: Radiation Oncology

## 2017-04-18 DIAGNOSIS — C3431 Malignant neoplasm of lower lobe, right bronchus or lung: Secondary | ICD-10-CM | POA: Diagnosis not present

## 2017-04-18 DIAGNOSIS — Z510 Encounter for antineoplastic radiation therapy: Secondary | ICD-10-CM | POA: Diagnosis not present

## 2017-04-18 DIAGNOSIS — R918 Other nonspecific abnormal finding of lung field: Secondary | ICD-10-CM

## 2017-04-18 NOTE — Progress Notes (Signed)
  Radiation Oncology         [phone]) [phone] ________________________________  Name: Adrian [name] MRN: [id number]  Date: 04/18/2017  DOB: 12-May-1950  Stereotactic Body Radiotherapy Treatment Procedure Note  NARRATIVE:  Adrian [name] was brought to the stereotactic radiation treatment machine and placed supine on the CT couch. The patient was set up for stereotactic body radiotherapy on the body fix pillow.  3D TREATMENT PLANNING AND DOSIMETRY:  The patient's radiation plan was reviewed and approved prior to starting treatment.  It showed 3-dimensional radiation distributions overlaid onto the planning CT.  The [facility] for the target structures as well as the organs at risk were reviewed. The documentation of this is filed in the radiation oncology EMR.  SIMULATION VERIFICATION:  The patient underwent CT imaging on the treatment unit.  These were carefully aligned to document that the ablative radiation dose would cover the target volume and maximally spare the nearby organs at risk according to the planned distribution.  SPECIAL TREATMENT PROCEDURE: [name] received high dose ablative stereotactic body radiotherapy to the planned target volume without unforeseen complications. Treatment was delivered uneventfully. The high doses associated with stereotactic body radiotherapy and the significant potential risks require careful treatment set up and patient monitoring constituting a special treatment procedure   STEREOTACTIC TREATMENT MANAGEMENT:  Following delivery, the patient was evaluated clinically. The patient tolerated treatment without significant acute effects, and was discharged to home in stable condition.    PLAN: Continue treatment as planned.  ________________________________  Blair Promise, PhD, MD

## 2017-04-19 ENCOUNTER — Ambulatory Visit: Payer: Medicare Other

## 2017-04-19 ENCOUNTER — Other Ambulatory Visit (HOSPITAL_BASED_OUTPATIENT_CLINIC_OR_DEPARTMENT_OTHER): Payer: Medicare Other

## 2017-04-19 ENCOUNTER — Ambulatory Visit (HOSPITAL_BASED_OUTPATIENT_CLINIC_OR_DEPARTMENT_OTHER): Payer: Medicare Other

## 2017-04-19 VITALS — BP 125/73 | HR 87 | Temp 98.6°F | Resp 18 | Wt 107.8 lb

## 2017-04-19 DIAGNOSIS — C7802 Secondary malignant neoplasm of left lung: Secondary | ICD-10-CM

## 2017-04-19 DIAGNOSIS — D4620 Refractory anemia with excess of blasts, unspecified: Secondary | ICD-10-CM

## 2017-04-19 DIAGNOSIS — D61818 Other pancytopenia: Secondary | ICD-10-CM

## 2017-04-19 DIAGNOSIS — C3412 Malignant neoplasm of upper lobe, left bronchus or lung: Secondary | ICD-10-CM

## 2017-04-19 DIAGNOSIS — Z5111 Encounter for antineoplastic chemotherapy: Secondary | ICD-10-CM | POA: Diagnosis not present

## 2017-04-19 DIAGNOSIS — C801 Malignant (primary) neoplasm, unspecified: Secondary | ICD-10-CM

## 2017-04-19 LAB — CBC WITH DIFFERENTIAL/PLATELET
BASO%: 0.2 % (ref 0.0–2.0)
Basophils Absolute: 0 10*3/uL (ref 0.0–0.1)
EOS%: 0.4 % (ref 0.0–7.0)
Eosinophils Absolute: 0.1 10*3/uL (ref 0.0–0.5)
HEMATOCRIT: 26.5 % — AB (ref 38.4–49.9)
HGB: 8.5 g/dL — ABNORMAL LOW (ref 13.0–17.1)
LYMPH#: 0.5 10*3/uL — AB (ref 0.9–3.3)
LYMPH%: 4.3 % — AB (ref 14.0–49.0)
MCH: 30.7 pg (ref 27.2–33.4)
MCHC: 32.1 g/dL (ref 32.0–36.0)
MCV: 95.7 fL (ref 79.3–98.0)
MONO#: 0.8 10*3/uL (ref 0.1–0.9)
MONO%: 7.4 % (ref 0.0–14.0)
NEUT#: 10 10*3/uL — ABNORMAL HIGH (ref 1.5–6.5)
NEUT%: 87.7 % — ABNORMAL HIGH (ref 39.0–75.0)
Platelets: 136 10*3/uL — ABNORMAL LOW (ref 140–400)
RBC: 2.77 10*6/uL — AB (ref 4.20–5.82)
RDW: 20.9 % — AB (ref 11.0–14.6)
WBC: 11.4 10*3/uL — AB (ref 4.0–10.3)

## 2017-04-19 LAB — COMPREHENSIVE METABOLIC PANEL
ALBUMIN: 3.1 g/dL — AB (ref 3.5–5.0)
ALK PHOS: 108 U/L (ref 40–150)
ALT: <6 U/L (ref 0–55)
ANION GAP: 10 meq/L (ref 3–11)
AST: 11 U/L (ref 5–34)
BUN: 25 mg/dL (ref 7.0–26.0)
CALCIUM: 8.8 mg/dL (ref 8.4–10.4)
CHLORIDE: 111 meq/L — AB (ref 98–109)
CO2: 19 mEq/L — ABNORMAL LOW (ref 22–29)
Creatinine: 0.8 mg/dL (ref 0.7–1.3)
Glucose: 85 mg/dl (ref 70–140)
POTASSIUM: 4.5 meq/L (ref 3.5–5.1)
Sodium: 140 mEq/L (ref 136–145)
Total Bilirubin: 0.38 mg/dL (ref 0.20–1.20)
Total Protein: 8.3 g/dL (ref 6.4–8.3)

## 2017-04-19 MED ORDER — PALONOSETRON HCL INJECTION 0.25 MG/5ML
INTRAVENOUS | Status: AC
  Filled 2017-04-19: qty 5

## 2017-04-19 MED ORDER — FAMOTIDINE IN NACL 20-0.9 MG/50ML-% IV SOLN
INTRAVENOUS | Status: AC
  Filled 2017-04-19: qty 50

## 2017-04-19 MED ORDER — DARBEPOETIN ALFA 500 MCG/ML IJ SOSY
PREFILLED_SYRINGE | INTRAMUSCULAR | Status: AC
  Filled 2017-04-19: qty 1

## 2017-04-19 MED ORDER — DIPHENHYDRAMINE HCL 50 MG/ML IJ SOLN
50.0000 mg | Freq: Once | INTRAMUSCULAR | Status: AC
  Administered 2017-04-19: 50 mg via INTRAVENOUS

## 2017-04-19 MED ORDER — SODIUM CHLORIDE 0.9% FLUSH
10.0000 mL | Freq: Once | INTRAVENOUS | Status: AC
  Administered 2017-04-19: 10 mL
  Filled 2017-04-19: qty 10

## 2017-04-19 MED ORDER — SODIUM CHLORIDE 0.9% FLUSH
10.0000 mL | INTRAVENOUS | Status: DC | PRN
  Administered 2017-04-19: 10 mL
  Filled 2017-04-19: qty 10

## 2017-04-19 MED ORDER — SODIUM CHLORIDE 0.9 % IV SOLN
45.0000 mg/m2 | Freq: Once | INTRAVENOUS | Status: AC
  Administered 2017-04-19: 72 mg via INTRAVENOUS
  Filled 2017-04-19: qty 12

## 2017-04-19 MED ORDER — FAMOTIDINE IN NACL 20-0.9 MG/50ML-% IV SOLN
20.0000 mg | Freq: Once | INTRAVENOUS | Status: AC
  Administered 2017-04-19: 20 mg via INTRAVENOUS

## 2017-04-19 MED ORDER — SODIUM CHLORIDE 0.9 % IV SOLN
Freq: Once | INTRAVENOUS | Status: AC
  Administered 2017-04-19: 10:00:00 via INTRAVENOUS

## 2017-04-19 MED ORDER — SODIUM CHLORIDE 0.9 % IV SOLN
20.0000 mg | Freq: Once | INTRAVENOUS | Status: AC
  Administered 2017-04-19: 20 mg via INTRAVENOUS
  Filled 2017-04-19: qty 2

## 2017-04-19 MED ORDER — PALONOSETRON HCL INJECTION 0.25 MG/5ML
0.2500 mg | Freq: Once | INTRAVENOUS | Status: AC
  Administered 2017-04-19: 0.25 mg via INTRAVENOUS

## 2017-04-19 MED ORDER — DARBEPOETIN ALFA 500 MCG/ML IJ SOSY
500.0000 ug | PREFILLED_SYRINGE | Freq: Once | INTRAMUSCULAR | Status: AC
  Administered 2017-04-19: 500 ug via SUBCUTANEOUS

## 2017-04-19 MED ORDER — DIPHENHYDRAMINE HCL 50 MG/ML IJ SOLN
INTRAMUSCULAR | Status: AC
  Filled 2017-04-19: qty 1

## 2017-04-19 MED ORDER — HEPARIN SOD (PORK) LOCK FLUSH 100 UNIT/ML IV SOLN
500.0000 [IU] | Freq: Once | INTRAVENOUS | Status: AC | PRN
  Administered 2017-04-19: 500 [IU]
  Filled 2017-04-19: qty 5

## 2017-04-19 MED ORDER — SODIUM CHLORIDE 0.9 % IV SOLN
152.0000 mg | Freq: Once | INTRAVENOUS | Status: AC
  Administered 2017-04-19: 150 mg via INTRAVENOUS
  Filled 2017-04-19: qty 15

## 2017-04-19 NOTE — Patient Instructions (Addendum)
   [facility] Discharge Instructions for Patients Receiving Chemotherapy  Today you received the following chemotherapy agents Taxol and Carboplatin   To help prevent nausea and vomiting after your treatment, we encourage you to take your nausea medication as directed.    If you develop nausea and vomiting that is not controlled by your nausea medication, call the clinic.   BELOW ARE SYMPTOMS THAT SHOULD BE REPORTED IMMEDIATELY:  *FEVER GREATER THAN 100.5 F  *CHILLS WITH OR WITHOUT FEVER  NAUSEA AND VOMITING THAT IS NOT CONTROLLED WITH YOUR NAUSEA MEDICATION  *UNUSUAL SHORTNESS OF BREATH  *UNUSUAL BRUISING OR BLEEDING  TENDERNESS IN MOUTH AND THROAT WITH OR WITHOUT PRESENCE OF ULCERS  *URINARY PROBLEMS  *BOWEL PROBLEMS  UNUSUAL RASH Items with * indicate a potential emergency and should be followed up as soon as possible.  Feel free to call the clinic should you have any questions or concerns. The clinic phone number is [phone].  Please show the CHEMO ALERT CARD at check-in to the Emergency Department and triage nurse.   

## 2017-04-19 NOTE — Progress Notes (Signed)
  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 Patient denied pain or difficulty swallowing. Reports having mild fatigue, usually takes naps to rest. Patient states having a dry cough, mild shortness of breath when sitting.   Plan: The patient has completed radiation treatment. The patient will return to radiation oncology clinic for routine followup in one month. I advised them to call or return sooner if they have any questions or concerns related to their recovery or treatment.  -----------------------------------  Blair Promise, PhD, MD  This document serves as a record of services personally performed by Gery Pray MD. It was created on his behalf by Delton Coombes, a trained medical scribe. The creation of this record is based on the scribe's personal observations and the provider's statements to them.

## 2017-04-26 ENCOUNTER — Other Ambulatory Visit: Payer: Self-pay | Admitting: Hematology and Oncology

## 2017-04-26 ENCOUNTER — Ambulatory Visit (HOSPITAL_BASED_OUTPATIENT_CLINIC_OR_DEPARTMENT_OTHER): Payer: Medicare Other

## 2017-04-26 ENCOUNTER — Telehealth: Payer: Self-pay | Admitting: Hematology and Oncology

## 2017-04-26 ENCOUNTER — Other Ambulatory Visit: Payer: Self-pay

## 2017-04-26 ENCOUNTER — Ambulatory Visit: Payer: Medicare Other

## 2017-04-26 ENCOUNTER — Ambulatory Visit (HOSPITAL_BASED_OUTPATIENT_CLINIC_OR_DEPARTMENT_OTHER): Payer: Medicare Other | Admitting: Hematology and Oncology

## 2017-04-26 ENCOUNTER — Other Ambulatory Visit (HOSPITAL_BASED_OUTPATIENT_CLINIC_OR_DEPARTMENT_OTHER): Payer: Medicare Other

## 2017-04-26 ENCOUNTER — Ambulatory Visit (HOSPITAL_COMMUNITY)
Admission: RE | Admit: 2017-04-26 | Discharge: 2017-04-26 | Disposition: A | Discharge status: Home / Self Care | Payer: Medicare Other | Source: Ambulatory Visit | Attending: Hematology and Oncology | Admitting: Hematology and Oncology

## 2017-04-26 VITALS — BP 133/63 | HR 74 | Temp 97.8°F | Resp 18

## 2017-04-26 VITALS — BP 106/64 | HR 82 | Temp 97.5°F | Resp 18 | Ht 72.0 in | Wt 106.6 lb

## 2017-04-26 DIAGNOSIS — C3412 Malignant neoplasm of upper lobe, left bronchus or lung: Secondary | ICD-10-CM

## 2017-04-26 DIAGNOSIS — D4620 Refractory anemia with excess of blasts, unspecified: Secondary | ICD-10-CM

## 2017-04-26 DIAGNOSIS — C801 Malignant (primary) neoplasm, unspecified: Secondary | ICD-10-CM

## 2017-04-26 DIAGNOSIS — Z5111 Encounter for antineoplastic chemotherapy: Secondary | ICD-10-CM | POA: Diagnosis not present

## 2017-04-26 DIAGNOSIS — D696 Thrombocytopenia, unspecified: Secondary | ICD-10-CM | POA: Diagnosis not present

## 2017-04-26 DIAGNOSIS — R64 Cachexia: Secondary | ICD-10-CM

## 2017-04-26 DIAGNOSIS — C7802 Secondary malignant neoplasm of left lung: Secondary | ICD-10-CM

## 2017-04-26 DIAGNOSIS — Z8589 Personal history of malignant neoplasm of other organs and systems: Secondary | ICD-10-CM

## 2017-04-26 DIAGNOSIS — R5383 Other fatigue: Secondary | ICD-10-CM

## 2017-04-26 DIAGNOSIS — D6481 Anemia due to antineoplastic chemotherapy: Secondary | ICD-10-CM

## 2017-04-26 DIAGNOSIS — D61818 Other pancytopenia: Secondary | ICD-10-CM

## 2017-04-26 DIAGNOSIS — T451X5A Adverse effect of antineoplastic and immunosuppressive drugs, initial encounter: Secondary | ICD-10-CM

## 2017-04-26 LAB — COMPREHENSIVE METABOLIC PANEL
ALT: <3 U/L (ref 0–55)
ANION GAP: 7 meq/L (ref 3–11)
AST: 10 U/L (ref 5–34)
Albumin: 3.2 g/dL — ABNORMAL LOW (ref 3.5–5.0)
Alkaline Phosphatase: 95 U/L (ref 40–150)
BILIRUBIN TOTAL: 0.41 mg/dL (ref 0.20–1.20)
BUN: 25.3 mg/dL (ref 7.0–26.0)
CHLORIDE: 110 meq/L — AB (ref 98–109)
CO2: 21 meq/L — AB (ref 22–29)
Calcium: 8.9 mg/dL (ref 8.4–10.4)
Creatinine: 0.8 mg/dL (ref 0.7–1.3)
Glucose: 81 mg/dl (ref 70–140)
Potassium: 4.9 mEq/L (ref 3.5–5.1)
Sodium: 138 mEq/L (ref 136–145)
Total Protein: 8.3 g/dL (ref 6.4–8.3)

## 2017-04-26 LAB — CBC WITH DIFFERENTIAL/PLATELET
BASO%: 0.8 % (ref 0.0–2.0)
Basophils Absolute: 0.1 10*3/uL (ref 0.0–0.1)
EOS ABS: 0 10*3/uL (ref 0.0–0.5)
EOS%: 0.1 % (ref 0.0–7.0)
HCT: 23.8 % — ABNORMAL LOW (ref 38.4–49.9)
HGB: 7.9 g/dL — ABNORMAL LOW (ref 13.0–17.1)
LYMPH%: 5.1 % — AB (ref 14.0–49.0)
MCH: 31.3 pg (ref 27.2–33.4)
MCHC: 33.1 g/dL (ref 32.0–36.0)
MCV: 94.5 fL (ref 79.3–98.0)
MONO#: 0.4 10*3/uL (ref 0.1–0.9)
MONO%: 5.8 % (ref 0.0–14.0)
NEUT#: 6.7 10*3/uL — ABNORMAL HIGH (ref 1.5–6.5)
NEUT%: 88.2 % — AB (ref 39.0–75.0)
PLATELETS: 110 10*3/uL — AB (ref 140–400)
RBC: 2.52 10*6/uL — AB (ref 4.20–5.82)
RDW: 18.4 % — ABNORMAL HIGH (ref 11.0–14.6)
WBC: 7.6 10*3/uL (ref 4.0–10.3)
lymph#: 0.4 10*3/uL — ABNORMAL LOW (ref 0.9–3.3)

## 2017-04-26 LAB — PREPARE RBC (CROSSMATCH)

## 2017-04-26 MED ORDER — PALONOSETRON HCL INJECTION 0.25 MG/5ML
0.2500 mg | Freq: Once | INTRAVENOUS | Status: AC
  Administered 2017-04-26: 0.25 mg via INTRAVENOUS

## 2017-04-26 MED ORDER — ACETAMINOPHEN 325 MG PO TABS
650.0000 mg | ORAL_TABLET | Freq: Once | ORAL | Status: AC
  Administered 2017-04-26: 650 mg via ORAL

## 2017-04-26 MED ORDER — FAMOTIDINE IN NACL 20-0.9 MG/50ML-% IV SOLN
20.0000 mg | Freq: Once | INTRAVENOUS | Status: AC
  Administered 2017-04-26: 20 mg via INTRAVENOUS

## 2017-04-26 MED ORDER — DIPHENHYDRAMINE HCL 50 MG/ML IJ SOLN
INTRAMUSCULAR | Status: AC
  Filled 2017-04-26: qty 1

## 2017-04-26 MED ORDER — SODIUM CHLORIDE 0.9% FLUSH
10.0000 mL | INTRAVENOUS | Status: DC | PRN
  Administered 2017-04-26: 10 mL
  Filled 2017-04-26: qty 10

## 2017-04-26 MED ORDER — SODIUM CHLORIDE 0.9 % IV SOLN
20.0000 mg | Freq: Once | INTRAVENOUS | Status: AC
  Administered 2017-04-26: 20 mg via INTRAVENOUS
  Filled 2017-04-26: qty 2

## 2017-04-26 MED ORDER — SODIUM CHLORIDE 0.9 % IV SOLN
250.0000 mL | Freq: Once | INTRAVENOUS | Status: AC
  Administered 2017-04-26: 250 mL via INTRAVENOUS

## 2017-04-26 MED ORDER — FAMOTIDINE IN NACL 20-0.9 MG/50ML-% IV SOLN
INTRAVENOUS | Status: AC
  Filled 2017-04-26: qty 50

## 2017-04-26 MED ORDER — SODIUM CHLORIDE 0.9 % IV SOLN
45.0000 mg/m2 | Freq: Once | INTRAVENOUS | Status: AC
  Administered 2017-04-26: 72 mg via INTRAVENOUS
  Filled 2017-04-26: qty 12

## 2017-04-26 MED ORDER — ACETAMINOPHEN 325 MG PO TABS
ORAL_TABLET | ORAL | Status: AC
  Filled 2017-04-26: qty 2

## 2017-04-26 MED ORDER — SODIUM CHLORIDE 0.9 % IV SOLN
152.0000 mg | Freq: Once | INTRAVENOUS | Status: AC
  Administered 2017-04-26: 150 mg via INTRAVENOUS
  Filled 2017-04-26: qty 15

## 2017-04-26 MED ORDER — HEPARIN SOD (PORK) LOCK FLUSH 100 UNIT/ML IV SOLN
500.0000 [IU] | Freq: Once | INTRAVENOUS | Status: AC | PRN
  Administered 2017-04-26: 500 [IU]
  Filled 2017-04-26: qty 5

## 2017-04-26 MED ORDER — SODIUM CHLORIDE 0.9 % IV SOLN
Freq: Once | INTRAVENOUS | Status: AC
  Administered 2017-04-26: 12:00:00 via INTRAVENOUS

## 2017-04-26 MED ORDER — DIPHENHYDRAMINE HCL 25 MG PO CAPS: 25.0000 mg | ORAL_CAPSULE | Freq: Once | ORAL | Status: DC

## 2017-04-26 MED ORDER — PALONOSETRON HCL INJECTION 0.25 MG/5ML
INTRAVENOUS | Status: AC
  Filled 2017-04-26: qty 5

## 2017-04-26 MED ORDER — DIPHENHYDRAMINE HCL 50 MG/ML IJ SOLN
50.0000 mg | Freq: Once | INTRAMUSCULAR | Status: AC
  Administered 2017-04-26: 50 mg via INTRAVENOUS

## 2017-04-26 MED ORDER — SODIUM CHLORIDE 0.9% FLUSH
10.0000 mL | Freq: Once | INTRAVENOUS | Status: AC
  Administered 2017-04-26: 10 mL
  Filled 2017-04-26: qty 10

## 2017-04-26 NOTE — Progress Notes (Signed)
Per Dr. Alvy Bimler, okay to treat with abnormal labs.

## 2017-04-26 NOTE — Patient Instructions (Signed)
Implanted Port Home Guide An implanted port is a type of central line that is placed under the skin. Central lines are used to provide IV access when treatment or nutrition needs to be given through a person's veins. Implanted ports are used for long-term IV access. An implanted port may be placed because:  You need IV medicine that would be irritating to the small veins in your hands or arms.  You need long-term IV medicines, such as antibiotics.  You need IV nutrition for a long period.  You need frequent blood draws for lab tests.  You need dialysis.  Implanted ports are usually placed in the chest area, but they can also be placed in the upper arm, the abdomen, or the leg. An implanted port has two main parts:  Reservoir. The reservoir is round and will appear as a small, raised area under your skin. The reservoir is the part where a needle is inserted to give medicines or draw blood.  Catheter. The catheter is a thin, flexible tube that extends from the reservoir. The catheter is placed into a large vein. Medicine that is inserted into the reservoir goes into the catheter and then into the vein.  How will I care for my incision site? Do not get the incision site wet. Bathe or shower as directed by your health care provider. How is my port accessed? Special steps must be taken to access the port:  Before the port is accessed, a numbing cream can be placed on the skin. This helps numb the skin over the port site.  Your health care provider uses a sterile technique to access the port. ? Your health care provider must put on a mask and sterile gloves. ? The skin over your port is cleaned carefully with an antiseptic and allowed to dry. ? The port is gently pinched between sterile gloves, and a needle is inserted into the port.  Only "non-coring" port needles should be used to access the port. Once the port is accessed, a blood return should be checked. This helps ensure that the port  is in the vein and is not clogged.  If your port needs to remain accessed for a constant infusion, a clear (transparent) bandage will be placed over the needle site. The bandage and needle will need to be changed every week, or as directed by your health care provider.  Keep the bandage covering the needle clean and dry. Do not get it wet. Follow your health care provider's instructions on how to take a shower or bath while the port is accessed.  If your port does not need to stay accessed, no bandage is needed over the port.  What is flushing? Flushing helps keep the port from getting clogged. Follow your health care provider's instructions on how and when to flush the port. Ports are usually flushed with saline solution or a medicine called heparin. The need for flushing will depend on how the port is used.  If the port is used for intermittent medicines or blood draws, the port will need to be flushed: ? After medicines have been given. ? After blood has been drawn. ? As part of routine maintenance.  If a constant infusion is running, the port may not need to be flushed.  How long will my port stay implanted? The port can stay in for as long as your health care provider thinks it is needed. When it is time for the port to come out, surgery will be   done to remove it. The procedure is similar to the one performed when the port was put in. When should I seek immediate medical care? When you have an implanted port, you should seek immediate medical care if:  You notice a bad smell coming from the incision site.  You have swelling, redness, or drainage at the incision site.  You have more swelling or pain at the port site or the surrounding area.  You have a fever that is not controlled with medicine.  This information is not intended to replace advice given to you by your health care provider. Make sure you discuss any questions you have with your health care provider. Document  Released: 04/25/2005 Document Revised: 10/01/2015 Document Reviewed: 12/31/2012 Elsevier Interactive Patient Education  2017 [employer]  

## 2017-04-26 NOTE — Patient Instructions (Addendum)
[facility] Discharge Instructions for Patients Receiving Chemotherapy  Today you received the following chemotherapy agents Taxol and Carboplatin  To help prevent nausea and vomiting after your treatment, we encourage you to take your nausea medication as directed   If you develop nausea and vomiting that is not controlled by your nausea medication, call the clinic.   BELOW ARE SYMPTOMS THAT SHOULD BE REPORTED IMMEDIATELY:  *FEVER GREATER THAN 100.5 F  *CHILLS WITH OR WITHOUT FEVER  NAUSEA AND VOMITING THAT IS NOT CONTROLLED WITH YOUR NAUSEA MEDICATION  *UNUSUAL SHORTNESS OF BREATH  *UNUSUAL BRUISING OR BLEEDING  TENDERNESS IN MOUTH AND THROAT WITH OR WITHOUT PRESENCE OF ULCERS  *URINARY PROBLEMS  *BOWEL PROBLEMS  UNUSUAL RASH Items with * indicate a potential emergency and should be followed up as soon as possible.  Feel free to call the clinic should you have any questions or concerns. The clinic phone number is [phone].  Please show the [facility] at check-in to the Emergency Department and triage nurse.   Blood Transfusion, Adult A blood transfusion is a procedure in which you receive donated blood, including plasma, platelets, and red blood cells, through an IV tube. You may need a blood transfusion because of illness, surgery, or injury. The blood may come from a donor. You may also be able to donate blood for yourself (autologous blood donation) before a surgery if you know that you might require a blood transfusion. The blood given in a transfusion is made up of different types of cells. You may receive:  Red blood cells. These carry oxygen to the cells in the body.  White blood cells. These help you fight infections.  Platelets. These help your blood to clot.  Plasma. This is the liquid part of your blood and it helps with fluid imbalances.  If you have hemophilia or another clotting disorder, you may also receive other types of  blood products. Tell a health care provider about:  Any allergies you have.  All medicines you are taking, including vitamins, herbs, eye drops, creams, and over-the-counter medicines.  Any problems you or family members have had with anesthetic medicines.  Any blood disorders you have.  Any surgeries you have had.  Any medical conditions you have, including any recent fever or cold symptoms.  Whether you are pregnant or may be pregnant.  Any previous reactions you have had during a blood transfusion. What are the risks? Generally, this is a safe procedure. However, problems may occur, including:  Having an allergic reaction to something in the donated blood. Hives and itching may be symptoms of this type of reaction.  Fever. This may be a reaction to the white blood cells in the transfused blood. Nausea or chest pain may accompany a fever.  Iron overload. This can happen from having many transfusions.  Transfusion-related acute lung injury (TRALI). This is a rare reaction that causes lung damage. The cause is not known.TRALI can occur within hours of a transfusion or several days later.  Sudden (acute) or delayed hemolytic reactions. This happens if your blood does not match the cells in your transfusion. Your body's defense system (immune system) may try to attack the new cells. This complication is rare. The symptoms include fever, chills, nausea, and low back pain or chest pain.  Infection or disease transmission. This is rare.  What happens before the procedure?  You will have a blood test to determine your blood type. This is necessary to know what  kind of blood your body will accept and to match it to the donor blood.  If you are going to have a planned surgery, you may be able to do an autologous blood donation. This may be done in case you need to have a transfusion.  If you have had an allergic reaction to a transfusion in the past, you may be given medicine to help  prevent a reaction. This medicine may be given to you by mouth or through an IV tube.  You will have your temperature, blood pressure, and pulse monitored before the transfusion.  Follow instructions from your health care provider about eating and drinking restrictions.  Ask your health care provider about: ? Changing or stopping your regular medicines. This is especially important if you are taking diabetes medicines or blood thinners. ? Taking medicines such as aspirin and ibuprofen. These medicines can thin your blood. Do not take these medicines before your procedure if your health care provider instructs you not to. What happens during the procedure?  An IV tube will be inserted into one of your veins.  The bag of donated blood will be attached to your IV tube. The blood will then enter through your vein.  Your temperature, blood pressure, and pulse will be monitored regularly during the transfusion. This monitoring is done to detect early signs of a transfusion reaction.  If you have any signs or symptoms of a reaction, your transfusion will be stopped and you may be given medicine.  When the transfusion is complete, your IV tube will be removed.  Pressure may be applied to the IV site for a few minutes.  A bandage (dressing) will be applied. The procedure may vary among health care providers and hospitals. What happens after the procedure?  Your temperature, blood pressure, heart rate, breathing rate, and blood oxygen level will be monitored often.  Your blood may be tested to see how you are responding to the transfusion.  You may be warmed with fluids or blankets to maintain a normal body temperature. Summary  A blood transfusion is a procedure in which you receive donated blood, including plasma, platelets, and red blood cells, through an IV tube.  Your temperature, blood pressure, and pulse will be monitored before, during, and after the transfusion.  Your blood may  be tested after the transfusion to see how your body has responded. This information is not intended to replace advice given to you by your health care provider. Make sure you discuss any questions you have with your health care provider. Document Released: 04/22/2000 Document Revised: 01/21/2016 Document Reviewed: 01/21/2016 Elsevier Interactive Patient Education  [employer].

## 2017-04-26 NOTE — Telephone Encounter (Signed)
Scheduled appt per 12/19 los - Gave patient AVS and calender per los.

## 2017-04-27 ENCOUNTER — Encounter: Payer: Self-pay | Admitting: Hematology and Oncology

## 2017-04-27 LAB — BPAM RBC
BLOOD PRODUCT EXPIRATION DATE: 201812282359
ISSUE DATE / TIME: 201812191514
Unit Type and Rh: 5100

## 2017-04-27 LAB — TYPE AND SCREEN
ABO/RH(D): O POS
Antibody Screen: NEGATIVE
Donor AG Type: NEGATIVE
Unit division: 0

## 2017-04-27 NOTE — Assessment & Plan Note (Signed)
His appetite is improved. He is doing well on low-dose prednisone therapy Continue the same

## 2017-04-27 NOTE — Assessment & Plan Note (Signed)
Recent imaging study showed mixed response The right lower lobe lung nodule has progressed slightly but overall other nodules has responded to treatment He tolerated treatment fairly well I recommend we continue palliative treatment along with palliative radiation therapy to the progressive lung nodule I plan to repeat PET/CT scan 6 weeks away from most recent radiation treatment to assess response to therapy and he agreed

## 2017-04-27 NOTE — Assessment & Plan Note (Signed)
This is likely due to recent treatment. The patient denies recent history of bleeding such as epistaxis, hematuria or hematochezia. He is asymptomatic from the low platelet count. I will observe for now.  he does not require transfusion now.

## 2017-04-27 NOTE — Assessment & Plan Note (Signed)
We discussed some of the risks, benefits, and alternatives of blood transfusions. The patient is symptomatic from anemia and the hemoglobin level is critically low.  Some of the side-effects to be expected including risks of transfusion reactions, chills, infection, syndrome of volume overload and risk of hospitalization from various reasons and the patient is willing to proceed and went ahead to sign consent today. He will also continue to receive darbepoetin injection to keep hemoglobin greater than 10

## 2017-04-27 NOTE — Progress Notes (Signed)
[place] OFFICE PROGRESS NOTE  Patient Care Team: [name], MD as PCP - General (Family Medicine) [name], MD as Consulting Physician (Hematology and Oncology)  SUMMARY OF ONCOLOGIC HISTORY: Oncology History   MDS, R-IPSS score of 3, low risk (Hg 8.9, +16 chromosome on BM, 2% blast count)   Squamous cell carcinoma of the epiglottis   Primary site: Larynx - Supraglottis (Left)   Staging method: AJCC 7th Edition   Clinical: Stage I (T1, N0, M0) signed by [name], MD on 04/30/2013 12:49 PM   Pathologic: Stage I (T1, N0, cM0) signed by [name], MD on 04/30/2013 12:49 PM   Summary: Stage I (T1, N0, cM0)       MDS (myelodysplastic syndrome), low grade ([place])   02/01/2007 Bone Marrow Biopsy    BM biopsy was abnormal, overall probable low grade MDS      03/23/2011 - 02/06/2013 Chemotherapy    [name], discontinued due to diagnosis of laryngeal ca      09/06/2013 Bone Marrow Biopsy    Repeat bone marrow aspirate and biopsy confirmed low-grade myelodysplastic syndrome.      09/17/2013 -  Chemotherapy    The patient resumed darbepoetin injections to treat the anemia.       History of head and neck cancer   03/06/2013 Procedure    Biopsy from epiglottic region showed invasive [facility]      04/25/2013 Imaging    Ct scan showed no other involvement in the LN      05/13/2013 - 06/28/2013 Radiation Therapy    He received radiation therapy, 70 gray in 35 fractions to the larynx      06/09/2016 Imaging    CT scan of the chest showed Bilateral spiculated soft tissue pulmonary masses, highly suspicious for multifocal malignancy. Borderline enlarged left-sided mediastinal lymphadenopathy. Background of severe emphysematous changes, chronic cylindrical bronchiectasis and hyperinflation of the lungs. Findings consistent with longstanding COPD. Diffusely thickened interstitial markings. This may represent chronic interstitial lung changes, however  lymphangitic spread of malignancy cannot be excluded. Anterior compression deformities of several of the thoracic vertebral bodies, with heterogeneous appearance of T7 and T9 vertebral body. This may represent degenerative changes, however osseous metastatic disease cannot be excluded.       07/11/2016 PET scan    There are 2 nodules in the left upper lobe and 2 nodules within the right lower lobe which have spiculated margin and exhibit intense radiotracer uptake compatible with multifocal pulmonary metastasis versus multiple synchronous primary pulmonary neoplasms. 2. Emphysema 3. Aortic atherosclerosis and multi vessel coronary artery calcification.       Cancer of upper lobe of left lung ([place])   06/09/2016 Imaging    CT chest: Bilateral spiculated soft tissue pulmonary masses, highly suspicious for multifocal malignancy. Borderline enlarged left-sided mediastinal lymphadenopathy. Background of severe emphysematous changes, chronic cylindrical bronchiectasis and hyperinflation of the lungs. Findings consistent with longstanding COPD. Diffusely thickened interstitial markings. This may represent chronic interstitial lung changes, however lymphangitic spread of malignancy cannot be excluded. Anterior compression deformities of several of the thoracic vertebral bodies, with heterogeneous appearance of T7 and T9 vertebral body. This may represent degenerative changes, however osseous metastatic disease cannot be excluded.       06/29/2016 Imaging    MRI brain: No evidence of metastatic disease or recent infarction. Advanced generalized brain atrophy with moderate to marked chronic small-vessel ischemic changes throughout. FLAIR imaging appears to show scattered gyri showing FLAIR hyperintensity. These abnormalities are not  confirmed with restricted diffusion or contrast enhancement. Therefore, we are not certain that this is not artifactual. The differential diagnosis does include true pathology such  as posterior reversible encephalopathy, viral or prion disease, postictal change in post chemotherapy change. If there is concern about active CNS pathology, re- scanning in 4-6 weeks could be useful.      07/27/2016 Imaging    CT chest: Bilateral pulmonary lesions. These are suspicious for metastatic disease. Lesions have minimally changed but there may be slight enlargement of the cavitary lesion in the right lower lobe. Severe emphysematous changes.       08/10/2016 Pathology Results    1. Lung, biopsy, RUL, (apical segment) - SCANT BENIGN LUNG PARENCHYMA. - THERE IS NO EVIDENCE OF MALIGNANCY. 2. Lung, biopsy, LUL lingula - SQUAMOUS CELL CARCINOMA. - SEE COMMENT. 3. Lung, biopsy, LUL - ADENOCARCINOMA. - SEE COMMENT. Microscopic Comment 2. The malignant cells are positive for cytokeratin 56 and p63. They are negative for TTF-1. The findings are consistent with squamous cell carcinoma. There is likely insufficient tissue remaining for additional studies, if requested. 3. The malignant cells are positive for TTF-1 and negative for p63 and cytokeratin 5/6. The findings are consistent with adenocarcinoma.      08/10/2016 Procedure    The targets were defined as follows: Left upper lobe nodule, target 1 Lingular nodule, target 2 Proximal right lower lobe nodule, target 3 More distal right lower lobe, nodule  The extendable working channel was secured into place and the locator guide was withdrawn. Under fluoroscopic guidance transbronchial needle brushings, transbronchial Wang needle biopsies, and transbronchial forceps biopsies were performed in the LUL (target 1) and the lingula (target 2) to be sent for cytology and pathology. A bronchioalveolar lavage was performed in the lingula in the vicinity of target 2  and sent for cytology and microbiology (bacterial, fungal, AFB smears and cultures).  Fiducial markers were then placed around target 1, target 2, and in the right lower lobe in the  vicinity of both target 3 and target 4 to be used for possible radiation therapy should this be indicated. At the end of the procedure a general airway inspection was performed and there was no evidence of active bleeding. The bronchoscope was removed.  The patient tolerated the procedure well. There was no significant blood loss and there were no obvious complications. A post-procedural chest x-ray is pending.  Samples: 1. Transbronchial needle brushings from targets 1 and 2 2. Transbronchial Wang needle biopsies from targets 1 and 2 3. Transbronchial forceps biopsies from targets 1 and 2 4. Bronchoalveolar lavage from lingula, target 2 5. Endobronchial biopsies from RUL apical segment 6. Endobronchial brushings from right upper lobe apical segment       08/10/2016 Genetic Testing    Patient has genetic testing done for [employer] and PD-L1 testing Results revealed PD-L1 testing is negative 0%      08/31/2016 Procedure    Technically successful right IJ power-injectable port catheter placement. Ready for routine use      11/30/2016 PET scan    1. Interval resection of the hypermetabolic lingular nodule. 2. The 3 other spiculated, hypermetabolic nodules within the left upper and right lower lobes are slightly smaller with decreased metabolic activity, consistent with some response to interval therapy. 3. No progressive disease      03/06/2017 PET scan    1. Mixed appearance, with significant improvement in the left upper lobe nodule ; stable appearance of the more cephalad right lower lobe  nodule; but enlargement and increased in metabolic activity of the more inferior right lower lobe nodule which appears cavitary. No new nodule. 2. Previously there was some moderate increase in diffuse marrow activity. That has increased further, and could be a response from prior granulocyte/marrow stimulation, although I cannot completely exclude the possibility that this is related to the  patient's myelodysplastic syndrome. No CT correlate. 3. Other imaging findings of potential clinical significance: Aortic Atherosclerosis (ICD10-I70.0) and Emphysema (ICD10-J43.9). Coronary atherosclerosis. Low-density blood pool suggests anemia. Chronic right upper lobe scarring. Mucus in both mainstem bronchi. Suspected left renal cysts. Indistinct but likely enlarged prostate gland.      04/06/2017 - 04/18/2017 Radiation Therapy    He received SBRT treatment        INTERVAL HISTORY: Please see below for problem oriented charting. He returns with his wife for further follow-up He tolerated radiation treatment  He denies chest pain or shortness of breath His appetite is stable He denies recent infection No recent fever or chills Denies peripheral neuropathy  REVIEW OF SYSTEMS:   Constitutional: Denies fevers, chills or abnormal weight loss Eyes: Denies blurriness of vision Ears, nose, mouth, throat, and face: Denies mucositis or sore throat Respiratory: Denies cough, dyspnea or wheezes Cardiovascular: Denies palpitation, chest discomfort or lower extremity swelling Gastrointestinal:  Denies nausea, heartburn or change in bowel habits Skin: Denies abnormal skin rashes Lymphatics: Denies new lymphadenopathy or easy bruising Neurological:Denies numbness, tingling or new weaknesses Behavioral/Psych: Mood is stable, no new changes  All other systems were reviewed with the patient and are negative.  I have reviewed the past medical history, past surgical history, social history and family history with the patient and they are unchanged from previous note.  ALLERGIES:  is allergic to no known allergies.  MEDICATIONS:  Current Outpatient Medications  Medication Sig Dispense Refill  . albuterol (PROVENTIL HFA;VENTOLIN HFA) 108 (90 Base) MCG/ACT inhaler Inhale 2 puffs into the lungs every 4 (four) hours as needed for wheezing or shortness of breath. 1 Inhaler 5  . cyanocobalamin 500  MCG tablet Take 500 mcg by mouth daily.    . ferrous sulfate 325 (65 FE) MG tablet Take 325 mg by mouth daily with breakfast.    . folic acid (FOLVITE) 1 MG tablet Take 1 mg by mouth daily.    . Glycopyrrolate-Formoterol (BEVESPI AEROSPHERE) 9-4.8 MCG/ACT AERO Inhale 2 puffs into the lungs 2 (two) times daily. 2 Inhaler 0  . levETIRAcetam (KEPPRA) 500 MG tablet Take 1 tablet (500 mg total) by mouth 2 (two) times daily. 60 tablet 3  . lidocaine-prilocaine (EMLA) cream Apply to affected area once 30 g 3  . ondansetron (ZOFRAN) 8 MG tablet Take 1 tablet (8 mg total) by mouth 2 (two) times daily as needed for refractory nausea / vomiting. Start on day 3 after chemo. (Patient not taking: Reported on 03/20/2017) 30 tablet 1  . predniSONE (DELTASONE) 10 MG tablet Take 1 tablet (10 mg total) by mouth daily with breakfast. 60 tablet 1  . predniSONE (DELTASONE) 5 MG tablet TAKE 1 TABLET (5 MG TOTAL) BY MOUTH DAILY WITH BREAKFAST. (Patient not taking: Reported on 03/20/2017) 30 tablet 1  . prochlorperazine (COMPAZINE) 10 MG tablet Take 10 mg by mouth every 6 (six) hours as needed.    . vitamin E 1000 UNIT capsule Take 1,000 Units by mouth daily.      No current facility-administered medications for this visit.     PHYSICAL EXAMINATION: ECOG PERFORMANCE STATUS: 1 - Symptomatic but  completely ambulatory  Vitals:   04/26/17 1054  BP: 106/64  Pulse: 82  Resp: 18  Temp: (!) 97.5 F (36.4 C)  SpO2: 100%   Filed Weights   04/26/17 1054  Weight: 106 lb 9.6 oz (48.4 kg)    GENERAL:alert, no distress and comfortable.  He looks thin and cachectic SKIN: skin color, texture, turgor are normal, no rashes or significant lesions EYES: normal, Conjunctiva are pale and non-injected, sclera clear OROPHARYNX:no exudate, no erythema and lips, buccal mucosa, and tongue normal  NECK: supple, thyroid normal size, non-tender, without nodularity LYMPH:  no palpable lymphadenopathy in the cervical, axillary or  inguinal LUNGS: clear to auscultation and percussion with normal breathing effort HEART: regular rate & rhythm and no murmurs and no lower extremity edema ABDOMEN:abdomen soft, non-tender and normal bowel sounds Musculoskeletal:no cyanosis of digits with digital clubbing  NEURO: alert & oriented x 3 with fluent speech, no focal motor/sensory deficits  LABORATORY DATA:  I have reviewed the data as listed    Component Value Date/Time   NA 138 04/26/2017 0954   K 4.9 04/26/2017 0954   CL 108 08/31/2016 1155   CL 107 09/19/2012 1325   CO2 21 (L) 04/26/2017 0954   GLUCOSE 81 04/26/2017 0954   GLUCOSE 97 09/19/2012 1325   BUN 25.3 04/26/2017 0954   CREATININE 0.8 04/26/2017 0954   CALCIUM 8.9 04/26/2017 0954   PROT 8.3 04/26/2017 0954   ALBUMIN 3.2 (L) 04/26/2017 0954   AST 10 04/26/2017 0954   ALT <3 04/26/2017 0954   ALKPHOS 95 04/26/2017 0954   BILITOT 0.41 04/26/2017 0954   GFRNONAA >60 08/31/2016 1155   GFRAA >60 08/31/2016 1155    No results found for: SPEP, UPEP  Lab Results  Component Value Date   WBC 7.6 04/26/2017   NEUTROABS 6.7 (H) 04/26/2017   HGB 7.9 (L) 04/26/2017   HCT 23.8 (L) 04/26/2017   MCV 94.5 04/26/2017   PLT 110 (L) 04/26/2017      Chemistry      Component Value Date/Time   NA 138 04/26/2017 0954   K 4.9 04/26/2017 0954   CL 108 08/31/2016 1155   CL 107 09/19/2012 1325   CO2 21 (L) 04/26/2017 0954   BUN 25.3 04/26/2017 0954   CREATININE 0.8 04/26/2017 0954      Component Value Date/Time   CALCIUM 8.9 04/26/2017 0954   ALKPHOS 95 04/26/2017 0954   AST 10 04/26/2017 0954   ALT <3 04/26/2017 0954   BILITOT 0.41 04/26/2017 0954       ASSESSMENT & PLAN:  Cancer of upper lobe of left lung (H[facility]) Recent imaging study showed mixed response The right lower lobe lung nodule has progressed slightly but overall other nodules has responded to treatment He tolerated treatment fairly well I recommend we continue palliative treatment along with  palliative radiation therapy to the progressive lung nodule I plan to repeat PET/CT scan 6 weeks away from most recent radiation treatment to assess response to therapy and he agreed  MDS (myelodysplastic syndrome), low grade ([place]) We discussed some of the risks, benefits, and alternatives of blood transfusions. The patient is symptomatic from anemia and the hemoglobin level is critically low.  Some of the side-effects to be expected including risks of transfusion reactions, chills, infection, syndrome of volume overload and risk of hospitalization from various reasons and the patient is willing to proceed and went ahead to sign consent today. He will also continue to receive darbepoetin injection to keep  hemoglobin greater than 10  Malignant cachexia (HCC) His appetite is improved. He is doing well on low-dose prednisone therapy Continue the same  History of head and neck cancer He had radiation exposure in the past I will check TSH level in his next visit  Thrombocytopenia [facility]) This is likely due to recent treatment. The patient denies recent history of bleeding such as epistaxis, hematuria or hematochezia. He is asymptomatic from the low platelet count. I will observe for now.  he does not require transfusion now.   No orders of the defined types were placed in this encounter.  All questions were answered. The patient knows to call the clinic with any problems, questions or concerns. No barriers to learning was detected. I spent 20 minutes counseling the patient face to face. The total time spent in the appointment was 25 minutes and more than 50% was on counseling and review of test results     [name], MD 04/27/2017 3:38 PM

## 2017-04-27 NOTE — Assessment & Plan Note (Signed)
He had radiation exposure in the past I will check TSH level in his next visit

## 2017-04-28 ENCOUNTER — Ambulatory Visit (HOSPITAL_BASED_OUTPATIENT_CLINIC_OR_DEPARTMENT_OTHER): Payer: Medicare Other

## 2017-04-28 VITALS — BP 137/78 | Temp 97.9°F | Resp 20

## 2017-04-28 DIAGNOSIS — Z5189 Encounter for other specified aftercare: Secondary | ICD-10-CM | POA: Diagnosis not present

## 2017-04-28 DIAGNOSIS — C7802 Secondary malignant neoplasm of left lung: Secondary | ICD-10-CM

## 2017-04-28 DIAGNOSIS — C801 Malignant (primary) neoplasm, unspecified: Secondary | ICD-10-CM

## 2017-04-28 DIAGNOSIS — C3412 Malignant neoplasm of upper lobe, left bronchus or lung: Secondary | ICD-10-CM | POA: Diagnosis not present

## 2017-04-28 MED ORDER — PEGFILGRASTIM INJECTION 6 MG/0.6ML ~~LOC~~
6.0000 mg | PREFILLED_SYRINGE | Freq: Once | SUBCUTANEOUS | Status: AC
Start: 2017-04-28 — End: 2017-04-28
  Administered 2017-04-28: 6 mg via SUBCUTANEOUS

## 2017-04-28 MED ORDER — PEGFILGRASTIM INJECTION 6 MG/0.6ML ~~LOC~~
PREFILLED_SYRINGE | SUBCUTANEOUS | Status: AC
  Filled 2017-04-28: qty 0.6

## 2017-04-28 NOTE — Patient Instructions (Signed)
Pegfilgrastim injection What is this medicine? PEGFILGRASTIM (PEG fil gra stim) is a long-acting granulocyte colony-stimulating factor that stimulates the growth of neutrophils, a type of white blood cell important in the body's fight against infection. It is used to reduce the incidence of fever and infection in patients with certain types of cancer who are receiving chemotherapy that affects the bone marrow, and to increase survival after being exposed to high doses of radiation. This medicine may be used for other purposes; ask your health care provider or pharmacist if you have questions. COMMON BRAND NAME(S): Neulasta What should I tell my health care provider before I take this medicine? They need to know if you have any of these conditions: -kidney disease -latex allergy -ongoing radiation therapy -sickle cell disease -skin reactions to acrylic adhesives (On-Body Injector only) -an unusual or allergic reaction to pegfilgrastim, filgrastim, other medicines, foods, dyes, or preservatives -pregnant or trying to get pregnant -breast-feeding How should I use this medicine? This medicine is for injection under the skin. If you get this medicine at home, you will be taught how to prepare and give the pre-filled syringe or how to use the On-body Injector. Refer to the patient Instructions for Use for detailed instructions. Use exactly as directed. Tell your healthcare provider immediately if you suspect that the On-body Injector may not have performed as intended or if you suspect the use of the On-body Injector resulted in a missed or partial dose. It is important that you put your used needles and syringes in a special sharps container. Do not put them in a trash can. If you do not have a sharps container, call your pharmacist or healthcare provider to get one. Talk to your pediatrician regarding the use of this medicine in children. While this drug may be prescribed for selected conditions,  precautions do apply. Overdosage: If you think you have taken too much of this medicine contact a poison control center or emergency room at once. NOTE: This medicine is only for you. Do not share this medicine with others. What if I miss a dose? It is important not to miss your dose. Call your doctor or health care professional if you miss your dose. If you miss a dose due to an On-body Injector failure or leakage, a new dose should be administered as soon as possible using a single prefilled syringe for manual use. What may interact with this medicine? Interactions have not been studied. Give your health care provider a list of all the medicines, herbs, non-prescription drugs, or dietary supplements you use. Also tell them if you smoke, drink alcohol, or use illegal drugs. Some items may interact with your medicine. This list may not describe all possible interactions. Give your health care provider a list of all the medicines, herbs, non-prescription drugs, or dietary supplements you use. Also tell them if you smoke, drink alcohol, or use illegal drugs. Some items may interact with your medicine. What should I watch for while using this medicine? You may need blood work done while you are taking this medicine. If you are going to need a MRI, CT scan, or other procedure, tell your doctor that you are using this medicine (On-Body Injector only). What side effects may I notice from receiving this medicine? Side effects that you should report to your doctor or health care professional as soon as possible: -allergic reactions like skin rash, itching or hives, swelling of the face, lips, or tongue -dizziness -fever -pain, redness, or irritation at site   where injected -pinpoint red spots on the skin -red or dark-brown urine -shortness of breath or breathing problems -stomach or side pain, or pain at the shoulder -swelling -tiredness -trouble passing urine or change in the amount of urine Side  effects that usually do not require medical attention (report to your doctor or health care professional if they continue or are bothersome): -bone pain -muscle pain This list may not describe all possible side effects. Call your doctor for medical advice about side effects. You may report side effects to FDA at 1-800-FDA-1088. Where should I keep my medicine? Keep out of the reach of children. Store pre-filled syringes in a refrigerator between 2 and 8 degrees C (36 and 46 degrees F). Do not freeze. Keep in carton to protect from light. Throw away this medicine if it is left out of the refrigerator for more than 48 hours. Throw away any unused medicine after the expiration date. NOTE: This sheet is a summary. It may not cover all possible information. If you have questions about this medicine, talk to your doctor, pharmacist, or health care provider.  2018 Elsevier/Gold Standard (2016-04-21 12:58:03)  

## 2017-05-03 ENCOUNTER — Ambulatory Visit (HOSPITAL_BASED_OUTPATIENT_CLINIC_OR_DEPARTMENT_OTHER): Payer: Medicare Other

## 2017-05-03 ENCOUNTER — Other Ambulatory Visit: Payer: Self-pay

## 2017-05-03 VITALS — BP 120/70 | HR 76 | Temp 97.9°F | Resp 18

## 2017-05-03 DIAGNOSIS — D4620 Refractory anemia with excess of blasts, unspecified: Secondary | ICD-10-CM | POA: Diagnosis not present

## 2017-05-03 DIAGNOSIS — D6481 Anemia due to antineoplastic chemotherapy: Secondary | ICD-10-CM

## 2017-05-03 DIAGNOSIS — D649 Anemia, unspecified: Secondary | ICD-10-CM

## 2017-05-03 DIAGNOSIS — C3412 Malignant neoplasm of upper lobe, left bronchus or lung: Secondary | ICD-10-CM | POA: Diagnosis not present

## 2017-05-03 DIAGNOSIS — T451X5A Adverse effect of antineoplastic and immunosuppressive drugs, initial encounter: Secondary | ICD-10-CM

## 2017-05-03 DIAGNOSIS — R5383 Other fatigue: Secondary | ICD-10-CM

## 2017-05-03 LAB — CBC WITH DIFFERENTIAL/PLATELET
BASO%: 0.2 % (ref 0.0–2.0)
BASOS ABS: 0 10*3/uL (ref 0.0–0.1)
EOS ABS: 0.1 10*3/uL (ref 0.0–0.5)
EOS%: 0.3 % (ref 0.0–7.0)
HEMATOCRIT: 26.6 % — AB (ref 38.4–49.9)
HEMOGLOBIN: 8.7 g/dL — AB (ref 13.0–17.1)
LYMPH#: 0.6 10*3/uL — AB (ref 0.9–3.3)
LYMPH%: 3.1 % — ABNORMAL LOW (ref 14.0–49.0)
MCH: 30.6 pg (ref 27.2–33.4)
MCHC: 32.9 g/dL (ref 32.0–36.0)
MCV: 93.2 fL (ref 79.3–98.0)
MONO#: 1 10*3/uL — ABNORMAL HIGH (ref 0.1–0.9)
MONO%: 5.1 % (ref 0.0–14.0)
NEUT#: 18.2 10*3/uL — ABNORMAL HIGH (ref 1.5–6.5)
NEUT%: 91.3 % — AB (ref 39.0–75.0)
Platelets: 112 10*3/uL — ABNORMAL LOW (ref 140–400)
RBC: 2.85 10*6/uL — ABNORMAL LOW (ref 4.20–5.82)
RDW: 17.2 % — AB (ref 11.0–14.6)
WBC: 20 10*3/uL — ABNORMAL HIGH (ref 4.0–10.3)

## 2017-05-03 LAB — COMPREHENSIVE METABOLIC PANEL
ALBUMIN: 3.2 g/dL — AB (ref 3.5–5.0)
ALK PHOS: 126 U/L (ref 40–150)
ALT: <6 U/L (ref 0–55)
AST: 9 U/L (ref 5–34)
Anion Gap: 9 mEq/L (ref 3–11)
BILIRUBIN TOTAL: 0.34 mg/dL (ref 0.20–1.20)
BUN: 18.5 mg/dL (ref 7.0–26.0)
CO2: 21 meq/L — AB (ref 22–29)
Calcium: 8.9 mg/dL (ref 8.4–10.4)
Chloride: 112 mEq/L — ABNORMAL HIGH (ref 98–109)
Creatinine: 0.9 mg/dL (ref 0.7–1.3)
EGFR: >60 mL/min/{1.73_m2} (ref >60)
GLUCOSE: 88 mg/dL (ref 70–140)
Potassium: 4.5 mEq/L (ref 3.5–5.1)
SODIUM: 142 meq/L (ref 136–145)
TOTAL PROTEIN: 8.6 g/dL — AB (ref 6.4–8.3)

## 2017-05-03 LAB — TSH: TSH: 1.047 m(IU)/L (ref 0.320–4.118)

## 2017-05-03 MED ORDER — DARBEPOETIN ALFA 500 MCG/ML IJ SOSY
PREFILLED_SYRINGE | INTRAMUSCULAR | Status: AC
  Filled 2017-05-03: qty 1

## 2017-05-03 MED ORDER — DARBEPOETIN ALFA 500 MCG/ML IJ SOSY
500.0000 ug | PREFILLED_SYRINGE | Freq: Once | INTRAMUSCULAR | Status: AC
Start: 2017-05-03 — End: 2017-05-03
  Administered 2017-05-03: 500 ug via SUBCUTANEOUS

## 2017-05-17 ENCOUNTER — Inpatient Hospital Stay: Payer: Medicare Other | Attending: Hematology and Oncology

## 2017-05-17 ENCOUNTER — Other Ambulatory Visit: Payer: Self-pay | Admitting: Hematology and Oncology

## 2017-05-17 ENCOUNTER — Inpatient Hospital Stay: Payer: Medicare Other

## 2017-05-17 VITALS — BP 127/78 | HR 78 | Temp 97.6°F | Resp 16 | Ht 72.0 in | Wt 106.5 lb

## 2017-05-17 DIAGNOSIS — C801 Malignant (primary) neoplasm, unspecified: Secondary | ICD-10-CM

## 2017-05-17 DIAGNOSIS — Z5111 Encounter for antineoplastic chemotherapy: Secondary | ICD-10-CM | POA: Insufficient documentation

## 2017-05-17 DIAGNOSIS — D4620 Refractory anemia with excess of blasts, unspecified: Secondary | ICD-10-CM | POA: Insufficient documentation

## 2017-05-17 DIAGNOSIS — C3412 Malignant neoplasm of upper lobe, left bronchus or lung: Secondary | ICD-10-CM | POA: Insufficient documentation

## 2017-05-17 DIAGNOSIS — J449 Chronic obstructive pulmonary disease, unspecified: Secondary | ICD-10-CM | POA: Insufficient documentation

## 2017-05-17 DIAGNOSIS — C7802 Secondary malignant neoplasm of left lung: Secondary | ICD-10-CM

## 2017-05-17 DIAGNOSIS — R64 Cachexia: Secondary | ICD-10-CM | POA: Diagnosis not present

## 2017-05-17 LAB — COMPREHENSIVE METABOLIC PANEL
ALBUMIN: 3 g/dL — AB (ref 3.5–5.0)
ALT: 4 U/L (ref 0–55)
AST: 9 U/L (ref 5–34)
Alkaline Phosphatase: 94 U/L (ref 40–150)
Anion gap: 8 (ref 3–11)
BUN: 22 mg/dL (ref 7–26)
CHLORIDE: 110 mmol/L — AB (ref 98–109)
CO2: 21 mmol/L — AB (ref 22–29)
CREATININE: 0.88 mg/dL (ref 0.70–1.30)
Calcium: 8.6 mg/dL (ref 8.4–10.4)
GFR calc Af Amer: >60 mL/min (ref >60)
GFR calc non Af Amer: >60 mL/min (ref >60)
Glucose, Bld: 86 mg/dL (ref 70–140)
POTASSIUM: 4.3 mmol/L (ref 3.5–5.1)
SODIUM: 139 mmol/L (ref 136–145)
Total Bilirubin: 0.3 mg/dL (ref 0.2–1.2)
Total Protein: 8.3 g/dL (ref 6.4–8.3)

## 2017-05-17 LAB — CBC WITH DIFFERENTIAL/PLATELET
Abs Granulocyte: 7.7 10*3/uL — ABNORMAL HIGH (ref 1.5–6.5)
BASOS PCT: 1 %
Basophils Absolute: 0.1 10*3/uL (ref 0.0–0.1)
EOS ABS: 0 10*3/uL (ref 0.0–0.5)
EOS PCT: 0 %
HCT: 20.7 % — ABNORMAL LOW (ref 38.4–49.9)
Hemoglobin: 7 g/dL — CL (ref 13.0–17.1)
LYMPHS PCT: 5 %
Lymphs Abs: 0.4 10*3/uL — ABNORMAL LOW (ref 0.9–3.3)
MCH: 31.8 pg (ref 27.2–33.4)
MCHC: 34 g/dL (ref 32.0–36.0)
MCV: 93.6 fL (ref 79.3–98.0)
Monocytes Absolute: 0.7 10*3/uL (ref 0.1–0.9)
Monocytes Relative: 8 %
NEUTROS PCT: 86 %
Neutro Abs: 7.7 10*3/uL — ABNORMAL HIGH (ref 1.5–6.5)
PLATELETS: 140 10*3/uL (ref 140–400)
RBC: 2.21 MIL/uL — AB (ref 4.20–5.82)
RDW: 16.7 % — ABNORMAL HIGH (ref 11.0–15.6)
WBC: 8.9 10*3/uL (ref 4.0–10.3)

## 2017-05-17 LAB — PREPARE RBC (CROSSMATCH)

## 2017-05-17 LAB — SAMPLE TO BLOOD BANK

## 2017-05-17 MED ORDER — SODIUM CHLORIDE 0.9% FLUSH
10.0000 mL | INTRAVENOUS | Status: AC | PRN
  Administered 2017-05-17: 10 mL
  Filled 2017-05-17: qty 10

## 2017-05-17 MED ORDER — SODIUM CHLORIDE 0.9 % IV SOLN
45.0000 mg/m2 | Freq: Once | INTRAVENOUS | Status: AC
  Administered 2017-05-17: 72 mg via INTRAVENOUS
  Filled 2017-05-17: qty 12

## 2017-05-17 MED ORDER — HEPARIN SOD (PORK) LOCK FLUSH 100 UNIT/ML IV SOLN
500.0000 [IU] | Freq: Every day | INTRAVENOUS | Status: AC | PRN
  Administered 2017-05-17: 500 [IU]
  Filled 2017-05-17: qty 5

## 2017-05-17 MED ORDER — SODIUM CHLORIDE 0.9 % IV SOLN: 250.0000 mL | Freq: Once | INTRAVENOUS | Status: AC

## 2017-05-17 MED ORDER — ACETAMINOPHEN 325 MG PO TABS
ORAL_TABLET | ORAL | Status: AC
  Filled 2017-05-17: qty 2

## 2017-05-17 MED ORDER — DIPHENHYDRAMINE HCL 25 MG PO CAPS: 25.0000 mg | ORAL_CAPSULE | Freq: Once | ORAL | Status: DC

## 2017-05-17 MED ORDER — SODIUM CHLORIDE 0.9 % IV SOLN
Freq: Once | INTRAVENOUS | Status: AC
  Administered 2017-05-17: 09:00:00 via INTRAVENOUS

## 2017-05-17 MED ORDER — ACETAMINOPHEN 325 MG PO TABS
650.0000 mg | ORAL_TABLET | Freq: Once | ORAL | Status: AC
  Administered 2017-05-17: 650 mg via ORAL

## 2017-05-17 MED ORDER — FAMOTIDINE IN NACL 20-0.9 MG/50ML-% IV SOLN
INTRAVENOUS | Status: AC
  Filled 2017-05-17: qty 50

## 2017-05-17 MED ORDER — SODIUM CHLORIDE 0.9% FLUSH
10.0000 mL | INTRAVENOUS | Status: DC | PRN
  Filled 2017-05-17: qty 10

## 2017-05-17 MED ORDER — SODIUM CHLORIDE 0.9 % IV SOLN
152.0000 mg | Freq: Once | INTRAVENOUS | Status: AC
  Administered 2017-05-17: 150 mg via INTRAVENOUS
  Filled 2017-05-17: qty 15

## 2017-05-17 MED ORDER — PALONOSETRON HCL INJECTION 0.25 MG/5ML
INTRAVENOUS | Status: AC
  Filled 2017-05-17: qty 5

## 2017-05-17 MED ORDER — SODIUM CHLORIDE 0.9 % IV SOLN
20.0000 mg | Freq: Once | INTRAVENOUS | Status: AC
  Administered 2017-05-17: 20 mg via INTRAVENOUS
  Filled 2017-05-17: qty 2

## 2017-05-17 MED ORDER — HEPARIN SOD (PORK) LOCK FLUSH 100 UNIT/ML IV SOLN
500.0000 [IU] | Freq: Once | INTRAVENOUS | Status: DC | PRN
  Filled 2017-05-17: qty 5

## 2017-05-17 MED ORDER — DIPHENHYDRAMINE HCL 25 MG PO CAPS
ORAL_CAPSULE | ORAL | Status: AC
  Filled 2017-05-17: qty 1

## 2017-05-17 MED ORDER — DIPHENHYDRAMINE HCL 50 MG/ML IJ SOLN
INTRAMUSCULAR | Status: AC
  Filled 2017-05-17: qty 1

## 2017-05-17 MED ORDER — FAMOTIDINE IN NACL 20-0.9 MG/50ML-% IV SOLN
20.0000 mg | Freq: Once | INTRAVENOUS | Status: AC
  Administered 2017-05-17: 20 mg via INTRAVENOUS

## 2017-05-17 MED ORDER — PALONOSETRON HCL INJECTION 0.25 MG/5ML
0.2500 mg | Freq: Once | INTRAVENOUS | Status: AC
  Administered 2017-05-17: 0.25 mg via INTRAVENOUS

## 2017-05-17 MED ORDER — DIPHENHYDRAMINE HCL 50 MG/ML IJ SOLN
50.0000 mg | Freq: Once | INTRAMUSCULAR | Status: AC
  Administered 2017-05-17: 50 mg via INTRAVENOUS

## 2017-05-17 NOTE — Patient Instructions (Signed)
[place] Discharge Instructions for Patients Receiving Chemotherapy  Today you received the following chemotherapy agents Taxol and Carboplatin   To help prevent nausea and vomiting after your treatment, we encourage you to take your nausea medication as directed   If you develop nausea and vomiting that is not controlled by your nausea medication, call the clinic.   BELOW ARE SYMPTOMS THAT SHOULD BE REPORTED IMMEDIATELY:  *FEVER GREATER THAN 100.5 F  *CHILLS WITH OR WITHOUT FEVER  NAUSEA AND VOMITING THAT IS NOT CONTROLLED WITH YOUR NAUSEA MEDICATION  *UNUSUAL SHORTNESS OF BREATH  *UNUSUAL BRUISING OR BLEEDING  TENDERNESS IN MOUTH AND THROAT WITH OR WITHOUT PRESENCE OF ULCERS  *URINARY PROBLEMS  *BOWEL PROBLEMS  UNUSUAL RASH Items with * indicate a potential emergency and should be followed up as soon as possible.  Feel free to call the clinic should you have any questions or concerns. The clinic phone number is [phone].  Please show the [facility] at check-in to the Emergency Department and triage nurse.   Blood Transfusion, Adult A blood transfusion is a procedure in which you receive donated blood, including plasma, platelets, and red blood cells, through an IV tube. You may need a blood transfusion because of illness, surgery, or injury. The blood may come from a donor. You may also be able to donate blood for yourself (autologous blood donation) before a surgery if you know that you might require a blood transfusion. The blood given in a transfusion is made up of different types of cells. You may receive:  Red blood cells. These carry oxygen to the cells in the body.  White blood cells. These help you fight infections.  Platelets. These help your blood to clot.  Plasma. This is the liquid part of your blood and it helps with fluid imbalances.  If you have hemophilia or another clotting disorder, you may also receive other types of  blood products. Tell a health care provider about:  Any allergies you have.  All medicines you are taking, including vitamins, herbs, eye drops, creams, and over-the-counter medicines.  Any problems you or family members have had with anesthetic medicines.  Any blood disorders you have.  Any surgeries you have had.  Any medical conditions you have, including any recent fever or cold symptoms.  Whether you are pregnant or may be pregnant.  Any previous reactions you have had during a blood transfusion. What are the risks? Generally, this is a safe procedure. However, problems may occur, including:  Having an allergic reaction to something in the donated blood. Hives and itching may be symptoms of this type of reaction.  Fever. This may be a reaction to the white blood cells in the transfused blood. Nausea or chest pain may accompany a fever.  Iron overload. This can happen from having many transfusions.  Transfusion-related acute lung injury (TRALI). This is a rare reaction that causes lung damage. The cause is not known.TRALI can occur within hours of a transfusion or several days later.  Sudden (acute) or delayed hemolytic reactions. This happens if your blood does not match the cells in your transfusion. Your body's defense system (immune system) may try to attack the new cells. This complication is rare. The symptoms include fever, chills, nausea, and low back pain or chest pain.  Infection or disease transmission. This is rare.  What happens before the procedure?  You will have a blood test to determine your blood type. This is necessary to know  what kind of blood your body will accept and to match it to the donor blood.  If you are going to have a planned surgery, you may be able to do an autologous blood donation. This may be done in case you need to have a transfusion.  If you have had an allergic reaction to a transfusion in the past, you may be given medicine to help  prevent a reaction. This medicine may be given to you by mouth or through an IV tube.  You will have your temperature, blood pressure, and pulse monitored before the transfusion.  Follow instructions from your health care provider about eating and drinking restrictions.  Ask your health care provider about: ? Changing or stopping your regular medicines. This is especially important if you are taking diabetes medicines or blood thinners. ? Taking medicines such as aspirin and ibuprofen. These medicines can thin your blood. Do not take these medicines before your procedure if your health care provider instructs you not to. What happens during the procedure?  An IV tube will be inserted into one of your veins.  The bag of donated blood will be attached to your IV tube. The blood will then enter through your vein.  Your temperature, blood pressure, and pulse will be monitored regularly during the transfusion. This monitoring is done to detect early signs of a transfusion reaction.  If you have any signs or symptoms of a reaction, your transfusion will be stopped and you may be given medicine.  When the transfusion is complete, your IV tube will be removed.  Pressure may be applied to the IV site for a few minutes.  A bandage (dressing) will be applied. The procedure may vary among health care providers and hospitals. What happens after the procedure?  Your temperature, blood pressure, heart rate, breathing rate, and blood oxygen level will be monitored often.  Your blood may be tested to see how you are responding to the transfusion.  You may be warmed with fluids or blankets to maintain a normal body temperature. Summary  A blood transfusion is a procedure in which you receive donated blood, including plasma, platelets, and red blood cells, through an IV tube.  Your temperature, blood pressure, and pulse will be monitored before, during, and after the transfusion.  Your blood may  be tested after the transfusion to see how your body has responded. This information is not intended to replace advice given to you by your health care provider. Make sure you discuss any questions you have with your health care provider. Document Released: 04/22/2000 Document Revised: 01/21/2016 Document Reviewed: 01/21/2016 Elsevier Interactive Patient Education  [employer].

## 2017-05-18 LAB — TYPE AND SCREEN
ABO/RH(D): O POS
Antibody Screen: NEGATIVE
Donor AG Type: NEGATIVE
Unit division: 0

## 2017-05-18 LAB — BPAM RBC
BLOOD PRODUCT EXPIRATION DATE: 201902042359
ISSUE DATE / TIME: 201901091346
Unit Type and Rh: 5100

## 2017-05-19 ENCOUNTER — Encounter: Payer: Self-pay | Admitting: Oncology

## 2017-05-22 ENCOUNTER — Encounter: Payer: Self-pay | Admitting: Radiation Oncology

## 2017-05-22 ENCOUNTER — Ambulatory Visit
Admission: RE | Admit: 2017-05-22 | Discharge: 2017-05-22 | Disposition: A | Discharge status: Home / Self Care | Payer: Medicare Other | Source: Ambulatory Visit | Attending: Radiation Oncology | Admitting: Radiation Oncology

## 2017-05-22 ENCOUNTER — Other Ambulatory Visit: Payer: Self-pay

## 2017-05-22 DIAGNOSIS — C3491 Malignant neoplasm of unspecified part of right bronchus or lung: Secondary | ICD-10-CM | POA: Insufficient documentation

## 2017-05-22 DIAGNOSIS — Z8521 Personal history of malignant neoplasm of larynx: Secondary | ICD-10-CM | POA: Insufficient documentation

## 2017-05-22 DIAGNOSIS — C3492 Malignant neoplasm of unspecified part of left bronchus or lung: Secondary | ICD-10-CM | POA: Diagnosis not present

## 2017-05-22 DIAGNOSIS — Z79899 Other long term (current) drug therapy: Secondary | ICD-10-CM | POA: Insufficient documentation

## 2017-05-22 DIAGNOSIS — F17210 Nicotine dependence, cigarettes, uncomplicated: Secondary | ICD-10-CM | POA: Insufficient documentation

## 2017-05-22 DIAGNOSIS — C3412 Malignant neoplasm of upper lobe, left bronchus or lung: Secondary | ICD-10-CM

## 2017-05-22 NOTE — Progress Notes (Signed)
Kail is here for follow up after treatment to his right lung.  He denies having any pain or shortness of breath.  He reports having a productive cough with white sputum.  He reports having a good appetite and said that his energy level comes and goes.  He said he is trying to quit smoking and currently smokes 1 cigarette per day.  BP 110/75 (BP Location: Left Arm, Patient Position: Sitting)   Pulse 98   Temp 98.4 F (36.9 C) (Oral)   Ht 6' (1.829 m)   Wt 108 lb 3.2 oz (49.1 kg)   SpO2 100%   BMI 14.67 kg/m    Wt Readings from Last 3 Encounters:  05/22/17 108 lb 3.2 oz (49.1 kg)  05/17/17 106 lb 8 oz (48.3 kg)  04/26/17 106 lb 9.6 oz (48.4 kg)

## 2017-05-22 NOTE — Progress Notes (Signed)
Radiation Oncology         [phone]) [phone] ________________________________  Name: Adrian [name] MRN: [id number]  Date: 05/22/2017  DOB: 12/27/1950  Follow-Up Visit Note  CC: Adrian Arabian, MD  Adrian Lark, MD    ICD-10-CM   1. Cancer of upper lobe of left lung (HCC) C34.12     Diagnosis:   67 y.o. 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  Interval Since Last Radiation:  1 months 04/06/17 - 04/18/17; 50 Gy was given to the right SBRT lung in 5 fractions.  Narrative:  The patient returns today for routine follow-up.  Adrian [name] is here for follow up after treatment to his right lung.  He denies having any pain or shortness of breath.  He reports having a productive cough with white sputum.  He reports having a good appetite and said that his energy level comes and goes. He denies any skin problems or itching. He said he is trying to quit smoking and currently smokes 1 cigarette per day.   ALLERGIES:  is allergic to no known allergies.  Meds: Current Outpatient Medications  Medication Sig Dispense Refill  . albuterol (PROVENTIL HFA;VENTOLIN HFA) 108 (90 Base) MCG/ACT inhaler Inhale 2 puffs into the lungs every 4 (four) hours as needed for wheezing or shortness of breath. 1 Inhaler 5  . cyanocobalamin 500 MCG tablet Take 500 mcg by mouth daily.    . ferrous sulfate 325 (65 FE) MG tablet Take 325 mg by mouth daily with breakfast.    . folic acid (FOLVITE) 1 MG tablet Take 1 mg by mouth daily.    . Glycopyrrolate-Formoterol (BEVESPI AEROSPHERE) 9-4.8 MCG/ACT AERO Inhale 2 puffs into the lungs 2 (two) times daily. 2 Inhaler 0  . levETIRAcetam (KEPPRA) 500 MG tablet Take 1 tablet (500 mg total) by mouth 2 (two) times daily. 60 tablet 3  . lidocaine-prilocaine (EMLA) cream Apply to affected area once 30 g 3  . vitamin E 1000 UNIT capsule Take 1,000 Units by  mouth daily.     . predniSONE (DELTASONE) 10 MG tablet Take 1 tablet (10 mg total) by mouth daily with breakfast. (Patient not taking: Reported on 05/22/2017) 60 tablet 1  . predniSONE (DELTASONE) 5 MG tablet TAKE 1 TABLET (5 MG TOTAL) BY MOUTH DAILY WITH BREAKFAST. (Patient not taking: Reported on 03/20/2017) 30 tablet 1   No current facility-administered medications for this encounter.     Physical Findings: The patient is in no acute distress. Patient is alert and oriented.  height is 6' (1.829 m) and weight is 108 lb 3.2 oz (49.1 kg). His oral temperature is 98.4 F (36.9 C). His blood pressure is 110/75 and his pulse is 98. His oxygen saturation is 100%. .  No significant changes. Lungs are clear to auscultation bilaterally. Heart has regular rate and rhythm. No palpable cervical, supraclavicular, or axillary adenopathy. Abdomen soft, non-tender, normal bowel sounds.   Lab Findings: Lab Results  Component Value Date   WBC 8.9 05/17/2017   HGB 7.0 (LL) 05/17/2017   HCT 20.7 (L) 05/17/2017   MCV 93.6 05/17/2017   PLT 140 05/17/2017    Radiographic Findings: No results found.  Impression:   Patient is doing well after his SBRT treatment.    Plan:  PRN follow up with radiation oncology. Will have have an imagining performed in the near future to assess response to  treatment, ordered by medical oncology.  ____________________________________  Blair Promise, PhD, MD  This document serves as a record of services personally performed by Gery Pray MD. It was created on his behalf by Delton Coombes, a trained medical scribe. The creation of this record is based on the scribe's personal observations and the provider's statements to them.

## 2017-05-24 ENCOUNTER — Inpatient Hospital Stay (HOSPITAL_BASED_OUTPATIENT_CLINIC_OR_DEPARTMENT_OTHER): Payer: Medicare Other | Admitting: Hematology and Oncology

## 2017-05-24 ENCOUNTER — Inpatient Hospital Stay: Payer: Medicare Other

## 2017-05-24 ENCOUNTER — Encounter: Payer: Self-pay | Admitting: Hematology and Oncology

## 2017-05-24 ENCOUNTER — Other Ambulatory Visit: Payer: Self-pay | Admitting: Hematology and Oncology

## 2017-05-24 VITALS — BP 124/67 | HR 78 | Temp 97.3°F | Resp 18 | Ht 72.0 in | Wt 104.7 lb

## 2017-05-24 VITALS — BP 150/82 | HR 92 | Temp 97.5°F | Resp 17

## 2017-05-24 DIAGNOSIS — D4620 Refractory anemia with excess of blasts, unspecified: Secondary | ICD-10-CM

## 2017-05-24 DIAGNOSIS — D61818 Other pancytopenia: Secondary | ICD-10-CM

## 2017-05-24 DIAGNOSIS — R64 Cachexia: Secondary | ICD-10-CM

## 2017-05-24 DIAGNOSIS — C7802 Secondary malignant neoplasm of left lung: Secondary | ICD-10-CM

## 2017-05-24 DIAGNOSIS — J449 Chronic obstructive pulmonary disease, unspecified: Secondary | ICD-10-CM | POA: Diagnosis not present

## 2017-05-24 DIAGNOSIS — C3412 Malignant neoplasm of upper lobe, left bronchus or lung: Secondary | ICD-10-CM

## 2017-05-24 DIAGNOSIS — D6481 Anemia due to antineoplastic chemotherapy: Secondary | ICD-10-CM

## 2017-05-24 DIAGNOSIS — T451X5A Adverse effect of antineoplastic and immunosuppressive drugs, initial encounter: Secondary | ICD-10-CM

## 2017-05-24 DIAGNOSIS — C801 Malignant (primary) neoplasm, unspecified: Secondary | ICD-10-CM

## 2017-05-24 DIAGNOSIS — Z5111 Encounter for antineoplastic chemotherapy: Secondary | ICD-10-CM | POA: Diagnosis not present

## 2017-05-24 LAB — CBC WITH DIFFERENTIAL/PLATELET
BASOS ABS: 0 10*3/uL (ref 0.0–0.1)
Basophils Relative: 1 %
EOS PCT: 0 %
Eosinophils Absolute: 0 10*3/uL (ref 0.0–0.5)
HCT: 22.9 % — ABNORMAL LOW (ref 38.4–49.9)
Hemoglobin: 7.7 g/dL — ABNORMAL LOW (ref 13.0–17.1)
Lymphocytes Relative: 5 %
Lymphs Abs: 0.3 10*3/uL — ABNORMAL LOW (ref 0.9–3.3)
MCH: 31.3 pg (ref 27.2–33.4)
MCHC: 33.6 g/dL (ref 32.0–36.0)
MCV: 93.3 fL (ref 79.3–98.0)
Monocytes Absolute: 0.3 10*3/uL (ref 0.1–0.9)
Monocytes Relative: 5 %
NEUTROS ABS: 5.9 10*3/uL (ref 1.5–6.5)
Neutrophils Relative %: 89 %
PLATELETS: 146 10*3/uL (ref 140–400)
RBC: 2.46 MIL/uL — AB (ref 4.20–5.82)
RDW: 16.5 % — ABNORMAL HIGH (ref 11.0–15.6)
WBC: 6.6 10*3/uL (ref 4.0–10.3)

## 2017-05-24 LAB — COMPREHENSIVE METABOLIC PANEL
ALBUMIN: 2.9 g/dL — AB (ref 3.5–5.0)
ALT: 6 U/L (ref 0–55)
AST: 10 U/L (ref 5–34)
Alkaline Phosphatase: 81 U/L (ref 40–150)
Anion gap: 8 (ref 3–11)
BUN: 26 mg/dL (ref 7–26)
CHLORIDE: 110 mmol/L — AB (ref 98–109)
CO2: 21 mmol/L — AB (ref 22–29)
CREATININE: 0.82 mg/dL (ref 0.70–1.30)
Calcium: 8.8 mg/dL (ref 8.4–10.4)
GFR calc Af Amer: >60 mL/min (ref >60)
GLUCOSE: 84 mg/dL (ref 70–140)
Potassium: 4.5 mmol/L (ref 3.5–5.1)
Sodium: 139 mmol/L (ref 136–145)
Total Bilirubin: 0.4 mg/dL (ref 0.2–1.2)
Total Protein: 8.1 g/dL (ref 6.4–8.3)

## 2017-05-24 LAB — SAMPLE TO BLOOD BANK

## 2017-05-24 LAB — PREPARE RBC (CROSSMATCH)

## 2017-05-24 MED ORDER — DARBEPOETIN ALFA 500 MCG/ML IJ SOSY
PREFILLED_SYRINGE | INTRAMUSCULAR | Status: AC
  Filled 2017-05-24: qty 1

## 2017-05-24 MED ORDER — SODIUM CHLORIDE 0.9 % IV SOLN
45.0000 mg/m2 | Freq: Once | INTRAVENOUS | Status: AC
  Administered 2017-05-24: 72 mg via INTRAVENOUS
  Filled 2017-05-24: qty 12

## 2017-05-24 MED ORDER — SODIUM CHLORIDE 0.9 % IV SOLN
152.0000 mg | Freq: Once | INTRAVENOUS | Status: AC
  Administered 2017-05-24: 150 mg via INTRAVENOUS
  Filled 2017-05-24: qty 15

## 2017-05-24 MED ORDER — ACETAMINOPHEN 325 MG PO TABS
ORAL_TABLET | ORAL | Status: AC
  Filled 2017-05-24: qty 2

## 2017-05-24 MED ORDER — DIPHENHYDRAMINE HCL 25 MG PO CAPS: 25.0000 mg | ORAL_CAPSULE | Freq: Once | ORAL | Status: DC

## 2017-05-24 MED ORDER — PALONOSETRON HCL INJECTION 0.25 MG/5ML
INTRAVENOUS | Status: AC
  Filled 2017-05-24: qty 5

## 2017-05-24 MED ORDER — ACETAMINOPHEN 325 MG PO TABS
650.0000 mg | ORAL_TABLET | Freq: Once | ORAL | Status: AC
  Administered 2017-05-24: 650 mg via ORAL

## 2017-05-24 MED ORDER — SODIUM CHLORIDE 0.9 % IV SOLN
250.0000 mL | Freq: Once | INTRAVENOUS | Status: AC
  Administered 2017-05-24: 250 mL via INTRAVENOUS

## 2017-05-24 MED ORDER — DARBEPOETIN ALFA 500 MCG/ML IJ SOSY
500.0000 ug | PREFILLED_SYRINGE | Freq: Once | INTRAMUSCULAR | Status: AC
  Administered 2017-05-24: 500 ug via SUBCUTANEOUS

## 2017-05-24 MED ORDER — DEXAMETHASONE SODIUM PHOSPHATE 100 MG/10ML IJ SOLN
20.0000 mg | Freq: Once | INTRAMUSCULAR | Status: AC
  Administered 2017-05-24: 20 mg via INTRAVENOUS
  Filled 2017-05-24: qty 2

## 2017-05-24 MED ORDER — DIPHENHYDRAMINE HCL 50 MG/ML IJ SOLN
50.0000 mg | Freq: Once | INTRAMUSCULAR | Status: AC
  Administered 2017-05-24: 50 mg via INTRAVENOUS

## 2017-05-24 MED ORDER — FAMOTIDINE IN NACL 20-0.9 MG/50ML-% IV SOLN
20.0000 mg | Freq: Once | INTRAVENOUS | Status: AC
  Administered 2017-05-24: 20 mg via INTRAVENOUS

## 2017-05-24 MED ORDER — SODIUM CHLORIDE 0.9% FLUSH
10.0000 mL | INTRAVENOUS | Status: DC | PRN
  Administered 2017-05-24: 10 mL
  Filled 2017-05-24: qty 10

## 2017-05-24 MED ORDER — SODIUM CHLORIDE 0.9% FLUSH
10.0000 mL | Freq: Once | INTRAVENOUS | Status: AC
  Administered 2017-05-24: 10 mL
  Filled 2017-05-24: qty 10

## 2017-05-24 MED ORDER — HEPARIN SOD (PORK) LOCK FLUSH 100 UNIT/ML IV SOLN
500.0000 [IU] | Freq: Once | INTRAVENOUS | Status: AC | PRN
  Administered 2017-05-24: 500 [IU]
  Filled 2017-05-24: qty 5

## 2017-05-24 MED ORDER — SODIUM CHLORIDE 0.9 % IV SOLN
Freq: Once | INTRAVENOUS | Status: AC
  Administered 2017-05-24: 10:00:00 via INTRAVENOUS

## 2017-05-24 MED ORDER — FAMOTIDINE IN NACL 20-0.9 MG/50ML-% IV SOLN
INTRAVENOUS | Status: AC
  Filled 2017-05-24: qty 50

## 2017-05-24 MED ORDER — DIPHENHYDRAMINE HCL 50 MG/ML IJ SOLN
INTRAMUSCULAR | Status: AC
  Filled 2017-05-24: qty 1

## 2017-05-24 MED ORDER — PALONOSETRON HCL INJECTION 0.25 MG/5ML
0.2500 mg | Freq: Once | INTRAVENOUS | Status: AC
  Administered 2017-05-24: 0.25 mg via INTRAVENOUS

## 2017-05-24 NOTE — Progress Notes (Signed)
[place] OFFICE PROGRESS NOTE  Patient Care Team: [name], MD as PCP - General (Family Medicine) [name], MD as Consulting Physician (Hematology and Oncology)  SUMMARY OF ONCOLOGIC HISTORY: Oncology History   MDS, R-IPSS score of 3, low risk (Hg 8.9, +16 chromosome on BM, 2% blast count)   Squamous cell carcinoma of the epiglottis   Primary site: Larynx - Supraglottis (Left)   Staging method: AJCC 7th Edition   Clinical: Stage I (T1, N0, M0) signed by [name], MD on 04/30/2013 12:49 PM   Pathologic: Stage I (T1, N0, cM0) signed by [name], MD on 04/30/2013 12:49 PM   Summary: Stage I (T1, N0, cM0)       MDS (myelodysplastic syndrome), low grade ([place])   02/01/2007 Bone Marrow Biopsy    BM biopsy was abnormal, overall probable low grade MDS      03/23/2011 - 02/06/2013 Chemotherapy    [name], discontinued due to diagnosis of laryngeal ca      09/06/2013 Bone Marrow Biopsy    Repeat bone marrow aspirate and biopsy confirmed low-grade myelodysplastic syndrome.      09/17/2013 -  Chemotherapy    The patient resumed darbepoetin injections to treat the anemia.       History of head and neck cancer   03/06/2013 Procedure    Biopsy from epiglottic region showed invasive [facility]      04/25/2013 Imaging    Ct scan showed no other involvement in the LN      05/13/2013 - 06/28/2013 Radiation Therapy    He received radiation therapy, 70 gray in 35 fractions to the larynx      06/09/2016 Imaging    CT scan of the chest showed Bilateral spiculated soft tissue pulmonary masses, highly suspicious for multifocal malignancy. Borderline enlarged left-sided mediastinal lymphadenopathy. Background of severe emphysematous changes, chronic cylindrical bronchiectasis and hyperinflation of the lungs. Findings consistent with longstanding COPD. Diffusely thickened interstitial markings. This may represent chronic interstitial lung changes, however  lymphangitic spread of malignancy cannot be excluded. Anterior compression deformities of several of the thoracic vertebral bodies, with heterogeneous appearance of T7 and T9 vertebral body. This may represent degenerative changes, however osseous metastatic disease cannot be excluded.       07/11/2016 PET scan    There are 2 nodules in the left upper lobe and 2 nodules within the right lower lobe which have spiculated margin and exhibit intense radiotracer uptake compatible with multifocal pulmonary metastasis versus multiple synchronous primary pulmonary neoplasms. 2. Emphysema 3. Aortic atherosclerosis and multi vessel coronary artery calcification.       Cancer of upper lobe of left lung ([place])   06/09/2016 Imaging    CT chest: Bilateral spiculated soft tissue pulmonary masses, highly suspicious for multifocal malignancy. Borderline enlarged left-sided mediastinal lymphadenopathy. Background of severe emphysematous changes, chronic cylindrical bronchiectasis and hyperinflation of the lungs. Findings consistent with longstanding COPD. Diffusely thickened interstitial markings. This may represent chronic interstitial lung changes, however lymphangitic spread of malignancy cannot be excluded. Anterior compression deformities of several of the thoracic vertebral bodies, with heterogeneous appearance of T7 and T9 vertebral body. This may represent degenerative changes, however osseous metastatic disease cannot be excluded.       06/29/2016 Imaging    MRI brain: No evidence of metastatic disease or recent infarction. Advanced generalized brain atrophy with moderate to marked chronic small-vessel ischemic changes throughout. FLAIR imaging appears to show scattered gyri showing FLAIR hyperintensity. These abnormalities are not  confirmed with restricted diffusion or contrast enhancement. Therefore, we are not certain that this is not artifactual. The differential diagnosis does include true pathology such  as posterior reversible encephalopathy, viral or prion disease, postictal change in post chemotherapy change. If there is concern about active CNS pathology, re- scanning in 4-6 weeks could be useful.      07/27/2016 Imaging    CT chest: Bilateral pulmonary lesions. These are suspicious for metastatic disease. Lesions have minimally changed but there may be slight enlargement of the cavitary lesion in the right lower lobe. Severe emphysematous changes.       08/10/2016 Pathology Results    1. Lung, biopsy, RUL, (apical segment) - SCANT BENIGN LUNG PARENCHYMA. - THERE IS NO EVIDENCE OF MALIGNANCY. 2. Lung, biopsy, LUL lingula - SQUAMOUS CELL CARCINOMA. - SEE COMMENT. 3. Lung, biopsy, LUL - ADENOCARCINOMA. - SEE COMMENT. Microscopic Comment 2. The malignant cells are positive for cytokeratin 56 and p63. They are negative for TTF-1. The findings are consistent with squamous cell carcinoma. There is likely insufficient tissue remaining for additional studies, if requested. 3. The malignant cells are positive for TTF-1 and negative for p63 and cytokeratin 5/6. The findings are consistent with adenocarcinoma.      08/10/2016 Procedure    The targets were defined as follows: Left upper lobe nodule, target 1 Lingular nodule, target 2 Proximal right lower lobe nodule, target 3 More distal right lower lobe, nodule  The extendable working channel was secured into place and the locator guide was withdrawn. Under fluoroscopic guidance transbronchial needle brushings, transbronchial Wang needle biopsies, and transbronchial forceps biopsies were performed in the LUL (target 1) and the lingula (target 2) to be sent for cytology and pathology. A bronchioalveolar lavage was performed in the lingula in the vicinity of target 2  and sent for cytology and microbiology (bacterial, fungal, AFB smears and cultures).  Fiducial markers were then placed around target 1, target 2, and in the right lower lobe in the  vicinity of both target 3 and target 4 to be used for possible radiation therapy should this be indicated. At the end of the procedure a general airway inspection was performed and there was no evidence of active bleeding. The bronchoscope was removed.  The patient tolerated the procedure well. There was no significant blood loss and there were no obvious complications. A post-procedural chest x-ray is pending.  Samples: 1. Transbronchial needle brushings from targets 1 and 2 2. Transbronchial Wang needle biopsies from targets 1 and 2 3. Transbronchial forceps biopsies from targets 1 and 2 4. Bronchoalveolar lavage from lingula, target 2 5. Endobronchial biopsies from RUL apical segment 6. Endobronchial brushings from right upper lobe apical segment       08/10/2016 Genetic Testing    Patient has genetic testing done for [employer] and PD-L1 testing Results revealed PD-L1 testing is negative 0%      08/31/2016 Procedure    Technically successful right IJ power-injectable port catheter placement. Ready for routine use      11/30/2016 PET scan    1. Interval resection of the hypermetabolic lingular nodule. 2. The 3 other spiculated, hypermetabolic nodules within the left upper and right lower lobes are slightly smaller with decreased metabolic activity, consistent with some response to interval therapy. 3. No progressive disease      03/06/2017 PET scan    1. Mixed appearance, with significant improvement in the left upper lobe nodule ; stable appearance of the more cephalad right lower lobe  nodule; but enlargement and increased in metabolic activity of the more inferior right lower lobe nodule which appears cavitary. No new nodule. 2. Previously there was some moderate increase in diffuse marrow activity. That has increased further, and could be a response from prior granulocyte/marrow stimulation, although I cannot completely exclude the possibility that this is related to the  patient's myelodysplastic syndrome. No CT correlate. 3. Other imaging findings of potential clinical significance: Aortic Atherosclerosis (ICD10-I70.0) and Emphysema (ICD10-J43.9). Coronary atherosclerosis. Low-density blood pool suggests anemia. Chronic right upper lobe scarring. Mucus in both mainstem bronchi. Suspected left renal cysts. Indistinct but likely enlarged prostate gland.      04/06/2017 - 04/18/2017 Radiation Therapy    He received SBRT treatment Radiation treatment dates:   04/06/17 - 04/18/17  Site/dose:   Right SBRT Lung// 50 Gy in 5 fx  Beams/energy:   Photon // SRBT/ SBT-3D        INTERVAL HISTORY: Please see below for problem oriented charting. He returns for further follow-up He has mild chronic cough Denies recent smoking No recent fever or chills Denies peripheral neuropathy He has mild fatigue His appetite remained poor/stable and he is not gaining weight.   No recent nausea, vomiting, diarrhea or constipation   REVIEW OF SYSTEMS:   Constitutional: Denies fevers, chills or abnormal weight loss Eyes: Denies blurriness of vision Ears, nose, mouth, throat, and face: Denies mucositis or sore throat Cardiovascular: Denies palpitation, chest discomfort or lower extremity swelling Gastrointestinal:  Denies nausea, heartburn or change in bowel habits Skin: Denies abnormal skin rashes Lymphatics: Denies new lymphadenopathy or easy bruising Neurological:Denies numbness, tingling or new weaknesses Behavioral/Psych: Mood is stable, no new changes  All other systems were reviewed with the patient and are negative.  I have reviewed the past medical history, past surgical history, social history and family history with the patient and they are unchanged from previous note.  ALLERGIES:  is allergic to no known allergies.  MEDICATIONS:  Current Outpatient Medications  Medication Sig Dispense Refill  . albuterol (PROVENTIL HFA;VENTOLIN HFA) 108 (90 Base)  MCG/ACT inhaler Inhale 2 puffs into the lungs every 4 (four) hours as needed for wheezing or shortness of breath. 1 Inhaler 5  . cyanocobalamin 500 MCG tablet Take 500 mcg by mouth daily.    . ferrous sulfate 325 (65 FE) MG tablet Take 325 mg by mouth daily with breakfast.    . folic acid (FOLVITE) 1 MG tablet Take 1 mg by mouth daily.    . Glycopyrrolate-Formoterol (BEVESPI AEROSPHERE) 9-4.8 MCG/ACT AERO Inhale 2 puffs into the lungs 2 (two) times daily. 2 Inhaler 0  . levETIRAcetam (KEPPRA) 500 MG tablet Take 1 tablet (500 mg total) by mouth 2 (two) times daily. 60 tablet 3  . lidocaine-prilocaine (EMLA) cream Apply to affected area once 30 g 3  . vitamin E 1000 UNIT capsule Take 1,000 Units by mouth daily.      No current facility-administered medications for this visit.     PHYSICAL EXAMINATION: ECOG PERFORMANCE STATUS: 2 - Symptomatic, <50% confined to bed  Vitals:   05/24/17 0853  BP: 124/67  Pulse: 78  Resp: 18  Temp: (!) 97.3 F (36.3 C)   Filed Weights   05/24/17 0853  Weight: 104 lb 11.2 oz (47.5 kg)    GENERAL:alert, no distress and comfortable.  He is extremely thin and cachectic SKIN: skin color, texture, turgor are normal, no rashes or significant lesions EYES: normal, Conjunctiva are pale and non-injected, sclera clear OROPHARYNX:no exudate,  no erythema and lips, buccal mucosa, and tongue normal  NECK: supple, thyroid normal size, non-tender, without nodularity LYMPH:  no palpable lymphadenopathy in the cervical, axillary or inguinal LUNGS: clear to auscultation and percussion with normal breathing effort HEART: regular rate & rhythm and no murmurs and no lower extremity edema ABDOMEN:abdomen soft, non-tender and normal bowel sounds Musculoskeletal:n noted digital clubbing NEURO: alert & oriented x 3 with fluent speech, no focal motor/sensory deficits  LABORATORY DATA:  I have reviewed the data as listed    Component Value Date/Time   NA 139 05/24/2017 0807    NA 142 05/03/2017 1114   K 4.5 05/24/2017 0807   K 4.5 05/03/2017 1114   CL 110 (H) 05/24/2017 0807   CL 107 09/19/2012 1325   CO2 21 (L) 05/24/2017 0807   CO2 21 (L) 05/03/2017 1114   GLUCOSE 84 05/24/2017 0807   GLUCOSE 88 05/03/2017 1114   GLUCOSE 97 09/19/2012 1325   BUN 26 05/24/2017 0807   BUN 18.5 05/03/2017 1114   CREATININE 0.82 05/24/2017 0807   CREATININE 0.9 05/03/2017 1114   CALCIUM 8.8 05/24/2017 0807   CALCIUM 8.9 05/03/2017 1114   PROT 8.1 05/24/2017 0807   PROT 8.6 (H) 05/03/2017 1114   ALBUMIN 2.9 (L) 05/24/2017 0807   ALBUMIN 3.2 (L) 05/03/2017 1114   AST 10 05/24/2017 0807   AST 9 05/03/2017 1114   ALT 6 05/24/2017 0807   ALT <6 05/03/2017 1114   ALKPHOS 81 05/24/2017 0807   ALKPHOS 126 05/03/2017 1114   BILITOT 0.4 05/24/2017 0807   BILITOT 0.34 05/03/2017 1114   GFRNONAA >60 05/24/2017 0807   GFRAA >60 05/24/2017 0807    No results found for: SPEP, UPEP  Lab Results  Component Value Date   WBC 6.6 05/24/2017   NEUTROABS 5.9 05/24/2017   HGB 7.7 (L) 05/24/2017   HCT 22.9 (L) 05/24/2017   MCV 93.3 05/24/2017   PLT 146 05/24/2017      Chemistry      Component Value Date/Time   NA 139 05/24/2017 0807   NA 142 05/03/2017 1114   K 4.5 05/24/2017 0807   K 4.5 05/03/2017 1114   CL 110 (H) 05/24/2017 0807   CL 107 09/19/2012 1325   CO2 21 (L) 05/24/2017 0807   CO2 21 (L) 05/03/2017 1114   BUN 26 05/24/2017 0807   BUN 18.5 05/03/2017 1114   CREATININE 0.82 05/24/2017 0807   CREATININE 0.9 05/03/2017 1114      Component Value Date/Time   CALCIUM 8.8 05/24/2017 0807   CALCIUM 8.9 05/03/2017 1114   ALKPHOS 81 05/24/2017 0807   ALKPHOS 126 05/03/2017 1114   AST 10 05/24/2017 0807   AST 9 05/03/2017 1114   ALT 6 05/24/2017 0807   ALT <6 05/03/2017 1114   BILITOT 0.4 05/24/2017 0807   BILITOT 0.34 05/03/2017 1114       ASSESSMENT & PLAN:  Cancer of upper lobe of left lung (HCC) Recent imaging study showed mixed response The right  lower lobe lung nodule has progressed slightly but overall other nodules has responded to treatment He tolerated treatment fairly well I recommend we continue palliative treatment along with palliative radiation therapy to the progressive lung nodule I plan to repeat PET/CT scan at the end of the month to assess treatment response  MDS (myelodysplastic syndrome), low grade ([place]) We discussed some of the risks, benefits, and alternatives of blood transfusions. The patient is symptomatic from anemia and the hemoglobin level is  critically low.  Some of the side-effects to be expected including risks of transfusion reactions, chills, infection, syndrome of volume overload and risk of hospitalization from various reasons and the patient is willing to proceed and went ahead to sign consent today. He will also continue to receive darbepoetin injection to keep hemoglobin greater than 10  Malignant cachexia ([place]) His appetite is stable but he is not able to gain any weight He has minimum response of prednisone  I encouraged him to increase oral supplement as tolerated  COPD, mild ([place]) He has chronic COPD without recent exacerbation He does not need antibiotic treatment   Orders Placed This Encounter  Procedures  . NM PET Image Restag (PS) Skull Base To Thigh    Standing Status:   Future    Standing Expiration Date:   05/24/2018    Order Specific Question:   If indicated for the ordered procedure, I authorize the administration of a radiopharmaceutical per Radiology protocol    Answer:   Yes    Order Specific Question:   Preferred imaging location?    Answer:   [facility]    Order Specific Question:   Radiology Contrast Protocol - do NOT remove file path    Answer:   [email or web address]  . Type and screen    Standing Status:   Future    Standing Expiration Date:   05/24/2018  . Prepare RBC    Standing Status:   Standing    Number of Occurrences:   1     Order Specific Question:   # of Units    Answer:   1 unit    Order Specific Question:   Transfusion Indications    Answer:   Symptomatic Anemia    Order Specific Question:   Special Requirements    Answer:   Irradiated    Order Specific Question:   If emergent release call blood bank    Answer:   Not emergent release  . Type and screen   All questions were answered. The patient knows to call the clinic with any problems, questions or concerns. No barriers to learning was detected. I spent 30 minutes counseling the patient face to face. The total time spent in the appointment was 40 minutes and more than 50% was on counseling and review of test results     [name], MD 05/24/2017 9:28 AM

## 2017-05-24 NOTE — Assessment & Plan Note (Signed)
We discussed some of the risks, benefits, and alternatives of blood transfusions. The patient is symptomatic from anemia and the hemoglobin level is critically low.  Some of the side-effects to be expected including risks of transfusion reactions, chills, infection, syndrome of volume overload and risk of hospitalization from various reasons and the patient is willing to proceed and went ahead to sign consent today. He will also continue to receive darbepoetin injection to keep hemoglobin greater than 10

## 2017-05-24 NOTE — Assessment & Plan Note (Signed)
His appetite is stable but he is not able to gain any weight He has minimum response of prednisone  I encouraged him to increase oral supplement as tolerated

## 2017-05-24 NOTE — Assessment & Plan Note (Signed)
He has chronic COPD without recent exacerbation He does not need antibiotic treatment

## 2017-05-24 NOTE — Assessment & Plan Note (Signed)
Recent imaging study showed mixed response The right lower lobe lung nodule has progressed slightly but overall other nodules has responded to treatment He tolerated treatment fairly well I recommend we continue palliative treatment along with palliative radiation therapy to the progressive lung nodule I plan to repeat PET/CT scan at the end of the month to assess treatment response

## 2017-05-24 NOTE — Patient Instructions (Signed)
[facility] Discharge Instructions for Patients Receiving Chemotherapy  Today you received the following chemotherapy agents: Paclitaxel (Taxol) and Carboplatin (Paraplatin).  To help prevent nausea and vomiting after your treatment, we encourage you to take your nausea medication as prescribed. Received Aloxi during treatment-->take Compazine (not Zofran) for the next 3 days.   If you develop nausea and vomiting that is not controlled by your nausea medication, call the clinic.   BELOW ARE SYMPTOMS THAT SHOULD BE REPORTED IMMEDIATELY:  *FEVER GREATER THAN 100.5 F  *CHILLS WITH OR WITHOUT FEVER  NAUSEA AND VOMITING THAT IS NOT CONTROLLED WITH YOUR NAUSEA MEDICATION  *UNUSUAL SHORTNESS OF BREATH  *UNUSUAL BRUISING OR BLEEDING  TENDERNESS IN MOUTH AND THROAT WITH OR WITHOUT PRESENCE OF ULCERS  *URINARY PROBLEMS  *BOWEL PROBLEMS  UNUSUAL RASH Items with * indicate a potential emergency and should be followed up as soon as possible.  Feel free to call the clinic should you have any questions or concerns. The clinic phone number is [phone].  Please show the [facility] at check-in to the Emergency Department and triage nurse.   Blood Transfusion, Care After This sheet gives you information about how to care for yourself after your procedure. Your doctor may also give you more specific instructions. If you have problems or questions, contact your doctor. Follow these instructions at home:  Take over-the-counter and prescription medicines only as told by your doctor.  Go back to your normal activities as told by your doctor.  Follow instructions from your doctor about how to take care of the area where an IV tube was put into your vein (insertion site). Make sure you: ? Wash your hands with soap and water before you change your bandage (dressing). If there is no soap and water, use hand sanitizer. ? Change your bandage as told by your doctor.  Check  your IV insertion site every day for signs of infection. Check for: ? More redness, swelling, or pain. ? More fluid or blood. ? Warmth. ? Pus or a bad smell. Contact a doctor if:  You have more redness, swelling, or pain around the IV insertion site..  You have more fluid or blood coming from the IV insertion site.  Your IV insertion site feels warm to the touch.  You have pus or a bad smell coming from the IV insertion site.  Your pee (urine) turns pink, red, or brown.  You feel weak after doing your normal activities. Get help right away if:  You have signs of a serious allergic or body defense (immune) system reaction, including: ? Itchiness. ? Hives. ? Trouble breathing. ? Anxiety. ? Pain in your chest or lower back. ? Fever, flushing, and chills. ? Fast pulse. ? Rash. ? Watery poop (diarrhea). ? Throwing up (vomiting). ? Dark pee. ? Serious headache. ? Dizziness. ? Stiff neck. ? Yellow color in your face or the white parts of your eyes (jaundice). Summary  After a blood transfusion, return to your normal activities as told by your doctor.  Every day, check for signs of infection where the IV tube was put into your vein.  Some signs of infection are warm skin, more redness and pain, more fluid or blood, and pus or a bad smell where the needle went in.  Contact your doctor if you feel weak or have any unusual symptoms. This information is not intended to replace advice given to you by your health care provider. Make sure you discuss any  questions you have with your health care provider. Document Released: 05/16/2014 Document Revised: 12/18/2015 Document Reviewed: 12/18/2015 Elsevier Interactive Patient Education  2017 [employer].

## 2017-05-25 DIAGNOSIS — Z1389 Encounter for screening for other disorder: Secondary | ICD-10-CM | POA: Diagnosis not present

## 2017-05-25 DIAGNOSIS — Z0000 Encounter for general adult medical examination without abnormal findings: Secondary | ICD-10-CM | POA: Diagnosis not present

## 2017-05-25 DIAGNOSIS — G40909 Epilepsy, unspecified, not intractable, without status epilepticus: Secondary | ICD-10-CM | POA: Diagnosis not present

## 2017-05-25 DIAGNOSIS — Z8589 Personal history of malignant neoplasm of other organs and systems: Secondary | ICD-10-CM | POA: Diagnosis not present

## 2017-05-25 LAB — TYPE AND SCREEN
ABO/RH(D): O POS
Antibody Screen: NEGATIVE
DONOR AG TYPE: NEGATIVE
Unit division: 0

## 2017-05-25 LAB — BPAM RBC
Blood Product Expiration Date: 201902112359
ISSUE DATE / TIME: 201901161241
Unit Type and Rh: 5100

## 2017-05-26 ENCOUNTER — Inpatient Hospital Stay: Payer: Medicare Other

## 2017-05-26 VITALS — BP 120/83 | HR 95 | Temp 98.9°F | Resp 20

## 2017-05-26 DIAGNOSIS — C3412 Malignant neoplasm of upper lobe, left bronchus or lung: Secondary | ICD-10-CM

## 2017-05-26 DIAGNOSIS — C7802 Secondary malignant neoplasm of left lung: Secondary | ICD-10-CM

## 2017-05-26 DIAGNOSIS — Z5111 Encounter for antineoplastic chemotherapy: Secondary | ICD-10-CM | POA: Diagnosis not present

## 2017-05-26 DIAGNOSIS — C801 Malignant (primary) neoplasm, unspecified: Secondary | ICD-10-CM

## 2017-05-26 MED ORDER — PEGFILGRASTIM INJECTION 6 MG/0.6ML ~~LOC~~
PREFILLED_SYRINGE | SUBCUTANEOUS | Status: AC
  Filled 2017-05-26: qty 0.6

## 2017-05-26 MED ORDER — PEGFILGRASTIM INJECTION 6 MG/0.6ML ~~LOC~~
6.0000 mg | PREFILLED_SYRINGE | Freq: Once | SUBCUTANEOUS | Status: AC
  Administered 2017-05-26: 6 mg via SUBCUTANEOUS

## 2017-05-26 NOTE — Patient Instructions (Signed)
Pegfilgrastim injection What is this medicine? PEGFILGRASTIM (PEG fil gra stim) is a long-acting granulocyte colony-stimulating factor that stimulates the growth of neutrophils, a type of white blood cell important in the body's fight against infection. It is used to reduce the incidence of fever and infection in patients with certain types of cancer who are receiving chemotherapy that affects the bone marrow, and to increase survival after being exposed to high doses of radiation. This medicine may be used for other purposes; ask your health care provider or pharmacist if you have questions. COMMON BRAND NAME(S): Neulasta What should I tell my health care provider before I take this medicine? They need to know if you have any of these conditions: -kidney disease -latex allergy -ongoing radiation therapy -sickle cell disease -skin reactions to acrylic adhesives (On-Body Injector only) -an unusual or allergic reaction to pegfilgrastim, filgrastim, other medicines, foods, dyes, or preservatives -pregnant or trying to get pregnant -breast-feeding How should I use this medicine? This medicine is for injection under the skin. If you get this medicine at home, you will be taught how to prepare and give the pre-filled syringe or how to use the On-body Injector. Refer to the patient Instructions for Use for detailed instructions. Use exactly as directed. Tell your healthcare provider immediately if you suspect that the On-body Injector may not have performed as intended or if you suspect the use of the On-body Injector resulted in a missed or partial dose. It is important that you put your used needles and syringes in a special sharps container. Do not put them in a trash can. If you do not have a sharps container, call your pharmacist or healthcare provider to get one. Talk to your pediatrician regarding the use of this medicine in children. While this drug may be prescribed for selected conditions,  precautions do apply. Overdosage: If you think you have taken too much of this medicine contact a poison control center or emergency room at once. NOTE: This medicine is only for you. Do not share this medicine with others. What if I miss a dose? It is important not to miss your dose. Call your doctor or health care professional if you miss your dose. If you miss a dose due to an On-body Injector failure or leakage, a new dose should be administered as soon as possible using a single prefilled syringe for manual use. What may interact with this medicine? Interactions have not been studied. Give your health care provider a list of all the medicines, herbs, non-prescription drugs, or dietary supplements you use. Also tell them if you smoke, drink alcohol, or use illegal drugs. Some items may interact with your medicine. This list may not describe all possible interactions. Give your health care provider a list of all the medicines, herbs, non-prescription drugs, or dietary supplements you use. Also tell them if you smoke, drink alcohol, or use illegal drugs. Some items may interact with your medicine. What should I watch for while using this medicine? You may need blood work done while you are taking this medicine. If you are going to need a MRI, CT scan, or other procedure, tell your doctor that you are using this medicine (On-Body Injector only). What side effects may I notice from receiving this medicine? Side effects that you should report to your doctor or health care professional as soon as possible: -allergic reactions like skin rash, itching or hives, swelling of the face, lips, or tongue -dizziness -fever -pain, redness, or irritation at site   where injected -pinpoint red spots on the skin -red or dark-brown urine -shortness of breath or breathing problems -stomach or side pain, or pain at the shoulder -swelling -tiredness -trouble passing urine or change in the amount of urine Side  effects that usually do not require medical attention (report to your doctor or health care professional if they continue or are bothersome): -bone pain -muscle pain This list may not describe all possible side effects. Call your doctor for medical advice about side effects. You may report side effects to FDA at 1-800-FDA-1088. Where should I keep my medicine? Keep out of the reach of children. Store pre-filled syringes in a refrigerator between 2 and 8 degrees C (36 and 46 degrees F). Do not freeze. Keep in carton to protect from light. Throw away this medicine if it is left out of the refrigerator for more than 48 hours. Throw away any unused medicine after the expiration date. NOTE: This sheet is a summary. It may not cover all possible information. If you have questions about this medicine, talk to your doctor, pharmacist, or health care provider.  2018 Elsevier/Gold Standard (2016-04-21 12:58:03)  

## 2017-05-29 ENCOUNTER — Telehealth: Payer: Self-pay | Admitting: Hematology and Oncology

## 2017-05-29 NOTE — Telephone Encounter (Signed)
Patients wife decided to leave it on 1/31 I did explain if she moved to 1/30 he would not have treatment per Tammi.

## 2017-06-07 ENCOUNTER — Other Ambulatory Visit: Payer: Self-pay

## 2017-06-07 MED ORDER — PALONOSETRON HCL INJECTION 0.25 MG/5ML
INTRAVENOUS | Status: AC
  Filled 2017-06-07: qty 5

## 2017-06-07 MED ORDER — FAMOTIDINE IN NACL 20-0.9 MG/50ML-% IV SOLN
INTRAVENOUS | Status: AC
  Filled 2017-06-07: qty 50

## 2017-06-07 MED ORDER — DIPHENHYDRAMINE HCL 50 MG/ML IJ SOLN
INTRAMUSCULAR | Status: AC
  Filled 2017-06-07: qty 1

## 2017-06-08 ENCOUNTER — Inpatient Hospital Stay (HOSPITAL_BASED_OUTPATIENT_CLINIC_OR_DEPARTMENT_OTHER): Payer: Medicare Other | Admitting: Hematology and Oncology

## 2017-06-08 ENCOUNTER — Inpatient Hospital Stay: Payer: Medicare Other

## 2017-06-08 ENCOUNTER — Ambulatory Visit: Payer: Self-pay

## 2017-06-08 ENCOUNTER — Other Ambulatory Visit: Payer: Self-pay

## 2017-06-08 ENCOUNTER — Telehealth: Payer: Self-pay | Admitting: Hematology and Oncology

## 2017-06-08 ENCOUNTER — Encounter: Payer: Self-pay | Admitting: Hematology and Oncology

## 2017-06-08 ENCOUNTER — Ambulatory Visit: Payer: Self-pay | Admitting: Hematology and Oncology

## 2017-06-08 DIAGNOSIS — D4620 Refractory anemia with excess of blasts, unspecified: Secondary | ICD-10-CM

## 2017-06-08 DIAGNOSIS — C3412 Malignant neoplasm of upper lobe, left bronchus or lung: Secondary | ICD-10-CM

## 2017-06-08 DIAGNOSIS — J449 Chronic obstructive pulmonary disease, unspecified: Secondary | ICD-10-CM

## 2017-06-08 DIAGNOSIS — Z5111 Encounter for antineoplastic chemotherapy: Secondary | ICD-10-CM | POA: Diagnosis not present

## 2017-06-08 DIAGNOSIS — C7802 Secondary malignant neoplasm of left lung: Secondary | ICD-10-CM

## 2017-06-08 DIAGNOSIS — C801 Malignant (primary) neoplasm, unspecified: Secondary | ICD-10-CM

## 2017-06-08 DIAGNOSIS — D61818 Other pancytopenia: Secondary | ICD-10-CM

## 2017-06-08 LAB — COMPREHENSIVE METABOLIC PANEL
AST: 11 U/L (ref 5–34)
Albumin: 3 g/dL — ABNORMAL LOW (ref 3.5–5.0)
Alkaline Phosphatase: 112 U/L (ref 40–150)
Anion gap: 8 (ref 3–11)
BUN: 20 mg/dL (ref 7–26)
CHLORIDE: 108 mmol/L (ref 98–109)
CO2: 22 mmol/L (ref 22–29)
CREATININE: 0.85 mg/dL (ref 0.70–1.30)
Calcium: 8.9 mg/dL (ref 8.4–10.4)
GFR calc Af Amer: >60 mL/min (ref >60)
GLUCOSE: 83 mg/dL (ref 70–140)
Potassium: 4.5 mmol/L (ref 3.5–5.1)
SODIUM: 138 mmol/L (ref 136–145)
Total Bilirubin: 0.4 mg/dL (ref 0.2–1.2)
Total Protein: 8.3 g/dL (ref 6.4–8.3)

## 2017-06-08 LAB — CBC WITH DIFFERENTIAL/PLATELET
Basophils Absolute: 0.1 10*3/uL (ref 0.0–0.1)
Basophils Relative: 0 %
EOS ABS: 0 10*3/uL (ref 0.0–0.5)
EOS PCT: 0 %
HCT: 26.1 % — ABNORMAL LOW (ref 38.4–49.9)
Hemoglobin: 8.6 g/dL — ABNORMAL LOW (ref 13.0–17.1)
LYMPHS ABS: 0.5 10*3/uL — AB (ref 0.9–3.3)
LYMPHS PCT: 2 %
MCH: 30.8 pg (ref 27.2–33.4)
MCHC: 33 g/dL (ref 32.0–36.0)
MCV: 93.2 fL (ref 79.3–98.0)
MONO ABS: 1 10*3/uL — AB (ref 0.1–0.9)
MONOS PCT: 5 %
Neutro Abs: 19.7 10*3/uL — ABNORMAL HIGH (ref 1.5–6.5)
Neutrophils Relative %: 93 %
PLATELETS: 162 10*3/uL (ref 140–400)
RBC: 2.8 MIL/uL — ABNORMAL LOW (ref 4.20–5.82)
RDW: 15.9 % — ABNORMAL HIGH (ref 11.0–14.6)
WBC: 21.2 10*3/uL — ABNORMAL HIGH (ref 4.0–10.3)

## 2017-06-08 LAB — TYPE AND SCREEN
ABO/RH(D): O POS
Antibody Screen: NEGATIVE

## 2017-06-08 MED ORDER — SODIUM CHLORIDE 0.9% FLUSH
10.0000 mL | Freq: Once | INTRAVENOUS | Status: AC
  Administered 2017-06-08: 10 mL
  Filled 2017-06-08: qty 10

## 2017-06-08 MED ORDER — SODIUM CHLORIDE 0.9 % IV SOLN
Freq: Once | INTRAVENOUS | Status: AC
  Administered 2017-06-08: 11:00:00 via INTRAVENOUS

## 2017-06-08 MED ORDER — DARBEPOETIN ALFA 500 MCG/ML IJ SOSY
500.0000 ug | PREFILLED_SYRINGE | Freq: Once | INTRAMUSCULAR | Status: AC
  Administered 2017-06-08: 500 ug via SUBCUTANEOUS
  Filled 2017-06-08: qty 1

## 2017-06-08 MED ORDER — PALONOSETRON HCL INJECTION 0.25 MG/5ML
0.2500 mg | Freq: Once | INTRAVENOUS | Status: AC
  Administered 2017-06-08: 0.25 mg via INTRAVENOUS

## 2017-06-08 MED ORDER — SODIUM CHLORIDE 0.9 % IV SOLN
45.0000 mg/m2 | Freq: Once | INTRAVENOUS | Status: AC
  Administered 2017-06-08: 72 mg via INTRAVENOUS
  Filled 2017-06-08: qty 12

## 2017-06-08 MED ORDER — DIPHENHYDRAMINE HCL 50 MG/ML IJ SOLN
INTRAMUSCULAR | Status: AC
  Filled 2017-06-08: qty 1

## 2017-06-08 MED ORDER — SODIUM CHLORIDE 0.9 % IV SOLN
20.0000 mg | Freq: Once | INTRAVENOUS | Status: AC
  Administered 2017-06-08: 20 mg via INTRAVENOUS
  Filled 2017-06-08: qty 2

## 2017-06-08 MED ORDER — DIPHENHYDRAMINE HCL 50 MG/ML IJ SOLN
50.0000 mg | Freq: Once | INTRAMUSCULAR | Status: AC
  Administered 2017-06-08: 50 mg via INTRAVENOUS

## 2017-06-08 MED ORDER — HEPARIN SOD (PORK) LOCK FLUSH 100 UNIT/ML IV SOLN
500.0000 [IU] | Freq: Once | INTRAVENOUS | Status: AC | PRN
  Administered 2017-06-08: 500 [IU]
  Filled 2017-06-08: qty 5

## 2017-06-08 MED ORDER — FAMOTIDINE IN NACL 20-0.9 MG/50ML-% IV SOLN
20.0000 mg | Freq: Once | INTRAVENOUS | Status: AC
  Administered 2017-06-08: 20 mg via INTRAVENOUS

## 2017-06-08 MED ORDER — SODIUM CHLORIDE 0.9% FLUSH
10.0000 mL | INTRAVENOUS | Status: DC | PRN
  Administered 2017-06-08: 10 mL
  Filled 2017-06-08: qty 10

## 2017-06-08 MED ORDER — FAMOTIDINE IN NACL 20-0.9 MG/50ML-% IV SOLN
INTRAVENOUS | Status: AC
  Filled 2017-06-08: qty 50

## 2017-06-08 MED ORDER — SODIUM CHLORIDE 0.9 % IV SOLN
152.0000 mg | Freq: Once | INTRAVENOUS | Status: AC
  Administered 2017-06-08: 150 mg via INTRAVENOUS
  Filled 2017-06-08: qty 15

## 2017-06-08 MED ORDER — PALONOSETRON HCL INJECTION 0.25 MG/5ML
INTRAVENOUS | Status: AC
  Filled 2017-06-08: qty 5

## 2017-06-08 NOTE — Assessment & Plan Note (Signed)
Unfortunately, PET/CT scan was not done I will get scheduler to reschedule his PET scan We would proceed with chemotherapy today I will see him back next week to review results of PET scan

## 2017-06-08 NOTE — Assessment & Plan Note (Signed)
He is not symptomatic from anemia He will get darbepoetin injection today He does not need blood transfusion

## 2017-06-08 NOTE — Assessment & Plan Note (Signed)
He has chronic COPD without recent exacerbation He does not need antibiotic treatment

## 2017-06-08 NOTE — Patient Instructions (Signed)
[facility] Discharge Instructions for Patients Receiving Chemotherapy  Today you received the following chemotherapy agents: Paclitaxel (Taxol) and Carboplatin (Paraplatin).  To help prevent nausea and vomiting after your treatment, we encourage you to take your nausea medication as prescribed. Received Aloxi during treatment-->Take Compazine (not Zofran) for the next 3 days.  If you develop nausea and vomiting that is not controlled by your nausea medication, call the clinic.   BELOW ARE SYMPTOMS THAT SHOULD BE REPORTED IMMEDIATELY:  *FEVER GREATER THAN 100.5 F  *CHILLS WITH OR WITHOUT FEVER  NAUSEA AND VOMITING THAT IS NOT CONTROLLED WITH YOUR NAUSEA MEDICATION  *UNUSUAL SHORTNESS OF BREATH  *UNUSUAL BRUISING OR BLEEDING  TENDERNESS IN MOUTH AND THROAT WITH OR WITHOUT PRESENCE OF ULCERS  *URINARY PROBLEMS  *BOWEL PROBLEMS  UNUSUAL RASH Items with * indicate a potential emergency and should be followed up as soon as possible.  Feel free to call the clinic should you have any questions or concerns. The clinic phone number is [phone].  Please show the [facility] at check-in to the Emergency Department and triage nurse.  Darbepoetin Alfa injection What is this medicine? DARBEPOETIN ALFA (dar be POE e tin AL fa) helps your body make more red blood cells. It is used to treat anemia caused by chronic kidney failure and chemotherapy. This medicine may be used for other purposes; ask your health care provider or pharmacist if you have questions. COMMON BRAND NAME(S): Aranesp What should I tell my health care provider before I take this medicine? They need to know if you have any of these conditions: -blood clotting disorders or history of blood clots -cancer patient not on chemotherapy -cystic fibrosis -heart disease, such as angina, heart failure, or a history of a heart attack -hemoglobin level of 12 g/dL or greater -high blood pressure -low levels  of folate, iron, or vitamin B12 -seizures -an unusual or allergic reaction to darbepoetin, erythropoietin, albumin, hamster proteins, latex, other medicines, foods, dyes, or preservatives -pregnant or trying to get pregnant -breast-feeding How should I use this medicine? This medicine is for injection into a vein or under the skin. It is usually given by a health care professional in a hospital or clinic setting. If you get this medicine at home, you will be taught how to prepare and give this medicine. Use exactly as directed. Take your medicine at regular intervals. Do not take your medicine more often than directed. It is important that you put your used needles and syringes in a special sharps container. Do not put them in a trash can. If you do not have a sharps container, call your pharmacist or healthcare provider to get one. A special MedGuide will be given to you by the pharmacist with each prescription and refill. Be sure to read this information carefully each time. Talk to your pediatrician regarding the use of this medicine in children. While this medicine may be used in children as young as 1 year for selected conditions, precautions do apply. Overdosage: If you think you have taken too much of this medicine contact a poison control center or emergency room at once. NOTE: This medicine is only for you. Do not share this medicine with others. What if I miss a dose? If you miss a dose, take it as soon as you can. If it is almost time for your next dose, take only that dose. Do not take double or extra doses. What may interact with this medicine? Do not take  this medicine with any of the following medications: -epoetin alfa This list may not describe all possible interactions. Give your health care provider a list of all the medicines, herbs, non-prescription drugs, or dietary supplements you use. Also tell them if you smoke, drink alcohol, or use illegal drugs. Some items may interact  with your medicine. What should I watch for while using this medicine? Your condition will be monitored carefully while you are receiving this medicine. You may need blood work done while you are taking this medicine. What side effects may I notice from receiving this medicine? Side effects that you should report to your doctor or health care professional as soon as possible: -allergic reactions like skin rash, itching or hives, swelling of the face, lips, or tongue -breathing problems -changes in vision -chest pain -confusion, trouble speaking or understanding -feeling faint or lightheaded, falls -high blood pressure -muscle aches or pains -pain, swelling, warmth in the leg -rapid weight gain -severe headaches -sudden numbness or weakness of the face, arm or leg -trouble walking, dizziness, loss of balance or coordination -seizures (convulsions) -swelling of the ankles, feet, hands -unusually weak or tired Side effects that usually do not require medical attention (report to your doctor or health care professional if they continue or are bothersome): -diarrhea -fever, chills (flu-like symptoms) -headaches -nausea, vomiting -redness, stinging, or swelling at site where injected This list may not describe all possible side effects. Call your doctor for medical advice about side effects. You may report side effects to FDA at 1-800-FDA-1088. Where should I keep my medicine? Keep out of the reach of children. Store in a refrigerator between 2 and 8 degrees C (36 and 46 degrees F). Do not freeze. Do not shake. Throw away any unused portion if using a single-dose vial. Throw away any unused medicine after the expiration date. NOTE: This sheet is a summary. It may not cover all possible information. If you have questions about this medicine, talk to your doctor, pharmacist, or health care provider.  2018 Elsevier/Gold Standard (2015-12-14 19:52:26)

## 2017-06-08 NOTE — Progress Notes (Signed)
[place] OFFICE PROGRESS NOTE  Patient Care Team: [name], MD as PCP - General (Family Medicine) [name], MD as Consulting Physician (Hematology and Oncology)  SUMMARY OF ONCOLOGIC HISTORY: Oncology History   MDS, R-IPSS score of 3, low risk (Hg 8.9, +16 chromosome on BM, 2% blast count)   Squamous cell carcinoma of the epiglottis   Primary site: Larynx - Supraglottis (Left)   Staging method: AJCC 7th Edition   Clinical: Stage I (T1, N0, M0) signed by [name], MD on 04/30/2013 12:49 PM   Pathologic: Stage I (T1, N0, cM0) signed by [name], MD on 04/30/2013 12:49 PM   Summary: Stage I (T1, N0, cM0)       MDS (myelodysplastic syndrome), low grade ([place])   02/01/2007 Bone Marrow Biopsy    BM biopsy was abnormal, overall probable low grade MDS      03/23/2011 - 02/06/2013 Chemotherapy    [name], discontinued due to diagnosis of laryngeal ca      09/06/2013 Bone Marrow Biopsy    Repeat bone marrow aspirate and biopsy confirmed low-grade myelodysplastic syndrome.      09/17/2013 -  Chemotherapy    The patient resumed darbepoetin injections to treat the anemia.       History of head and neck cancer   03/06/2013 Procedure    Biopsy from epiglottic region showed invasive [facility]      04/25/2013 Imaging    Ct scan showed no other involvement in the LN      05/13/2013 - 06/28/2013 Radiation Therapy    He received radiation therapy, 70 gray in 35 fractions to the larynx      06/09/2016 Imaging    CT scan of the chest showed Bilateral spiculated soft tissue pulmonary masses, highly suspicious for multifocal malignancy. Borderline enlarged left-sided mediastinal lymphadenopathy. Background of severe emphysematous changes, chronic cylindrical bronchiectasis and hyperinflation of the lungs. Findings consistent with longstanding COPD. Diffusely thickened interstitial markings. This may represent chronic interstitial lung changes, however  lymphangitic spread of malignancy cannot be excluded. Anterior compression deformities of several of the thoracic vertebral bodies, with heterogeneous appearance of T7 and T9 vertebral body. This may represent degenerative changes, however osseous metastatic disease cannot be excluded.       07/11/2016 PET scan    There are 2 nodules in the left upper lobe and 2 nodules within the right lower lobe which have spiculated margin and exhibit intense radiotracer uptake compatible with multifocal pulmonary metastasis versus multiple synchronous primary pulmonary neoplasms. 2. Emphysema 3. Aortic atherosclerosis and multi vessel coronary artery calcification.       Cancer of upper lobe of left lung ([place])   06/09/2016 Imaging    CT chest: Bilateral spiculated soft tissue pulmonary masses, highly suspicious for multifocal malignancy. Borderline enlarged left-sided mediastinal lymphadenopathy. Background of severe emphysematous changes, chronic cylindrical bronchiectasis and hyperinflation of the lungs. Findings consistent with longstanding COPD. Diffusely thickened interstitial markings. This may represent chronic interstitial lung changes, however lymphangitic spread of malignancy cannot be excluded. Anterior compression deformities of several of the thoracic vertebral bodies, with heterogeneous appearance of T7 and T9 vertebral body. This may represent degenerative changes, however osseous metastatic disease cannot be excluded.       06/29/2016 Imaging    MRI brain: No evidence of metastatic disease or recent infarction. Advanced generalized brain atrophy with moderate to marked chronic small-vessel ischemic changes throughout. FLAIR imaging appears to show scattered gyri showing FLAIR hyperintensity. These abnormalities are not  confirmed with restricted diffusion or contrast enhancement. Therefore, we are not certain that this is not artifactual. The differential diagnosis does include true pathology such  as posterior reversible encephalopathy, viral or prion disease, postictal change in post chemotherapy change. If there is concern about active CNS pathology, re- scanning in 4-6 weeks could be useful.      07/27/2016 Imaging    CT chest: Bilateral pulmonary lesions. These are suspicious for metastatic disease. Lesions have minimally changed but there may be slight enlargement of the cavitary lesion in the right lower lobe. Severe emphysematous changes.       08/10/2016 Pathology Results    1. Lung, biopsy, RUL, (apical segment) - SCANT BENIGN LUNG PARENCHYMA. - THERE IS NO EVIDENCE OF MALIGNANCY. 2. Lung, biopsy, LUL lingula - SQUAMOUS CELL CARCINOMA. - SEE COMMENT. 3. Lung, biopsy, LUL - ADENOCARCINOMA. - SEE COMMENT. Microscopic Comment 2. The malignant cells are positive for cytokeratin 56 and p63. They are negative for TTF-1. The findings are consistent with squamous cell carcinoma. There is likely insufficient tissue remaining for additional studies, if requested. 3. The malignant cells are positive for TTF-1 and negative for p63 and cytokeratin 5/6. The findings are consistent with adenocarcinoma.      08/10/2016 Procedure    The targets were defined as follows: Left upper lobe nodule, target 1 Lingular nodule, target 2 Proximal right lower lobe nodule, target 3 More distal right lower lobe, nodule  The extendable working channel was secured into place and the locator guide was withdrawn. Under fluoroscopic guidance transbronchial needle brushings, transbronchial Wang needle biopsies, and transbronchial forceps biopsies were performed in the LUL (target 1) and the lingula (target 2) to be sent for cytology and pathology. A bronchioalveolar lavage was performed in the lingula in the vicinity of target 2  and sent for cytology and microbiology (bacterial, fungal, AFB smears and cultures).  Fiducial markers were then placed around target 1, target 2, and in the right lower lobe in the  vicinity of both target 3 and target 4 to be used for possible radiation therapy should this be indicated. At the end of the procedure a general airway inspection was performed and there was no evidence of active bleeding. The bronchoscope was removed.  The patient tolerated the procedure well. There was no significant blood loss and there were no obvious complications. A post-procedural chest x-ray is pending.  Samples: 1. Transbronchial needle brushings from targets 1 and 2 2. Transbronchial Wang needle biopsies from targets 1 and 2 3. Transbronchial forceps biopsies from targets 1 and 2 4. Bronchoalveolar lavage from lingula, target 2 5. Endobronchial biopsies from RUL apical segment 6. Endobronchial brushings from right upper lobe apical segment       08/10/2016 Genetic Testing    Patient has genetic testing done for [employer] and PD-L1 testing Results revealed PD-L1 testing is negative 0%      08/31/2016 Procedure    Technically successful right IJ power-injectable port catheter placement. Ready for routine use      11/30/2016 PET scan    1. Interval resection of the hypermetabolic lingular nodule. 2. The 3 other spiculated, hypermetabolic nodules within the left upper and right lower lobes are slightly smaller with decreased metabolic activity, consistent with some response to interval therapy. 3. No progressive disease      03/06/2017 PET scan    1. Mixed appearance, with significant improvement in the left upper lobe nodule ; stable appearance of the more cephalad right lower lobe  nodule; but enlargement and increased in metabolic activity of the more inferior right lower lobe nodule which appears cavitary. No new nodule. 2. Previously there was some moderate increase in diffuse marrow activity. That has increased further, and could be a response from prior granulocyte/marrow stimulation, although I cannot completely exclude the possibility that this is related to the  patient's myelodysplastic syndrome. No CT correlate. 3. Other imaging findings of potential clinical significance: Aortic Atherosclerosis (ICD10-I70.0) and Emphysema (ICD10-J43.9). Coronary atherosclerosis. Low-density blood pool suggests anemia. Chronic right upper lobe scarring. Mucus in both mainstem bronchi. Suspected left renal cysts. Indistinct but likely enlarged prostate gland.      04/06/2017 - 04/18/2017 Radiation Therapy    He received SBRT treatment Radiation treatment dates:   04/06/17 - 04/18/17  Site/dose:   Right SBRT Lung// 50 Gy in 5 fx  Beams/energy:   Photon // SRBT/ SBT-3D        INTERVAL HISTORY: Please see below for problem oriented charting. He returns with his wife for further follow-up Unfortunately, PET/CT scan was not done He continues to have mild nonproductive cough Denies recent fever or chills No peripheral neuropathy. The patient denies any recent signs or symptoms of bleeding such as spontaneous epistaxis, hematuria or hematochezia.   REVIEW OF SYSTEMS:   Constitutional: Denies fevers, chills or abnormal weight loss Eyes: Denies blurriness of vision Ears, nose, mouth, throat, and face: Denies mucositis or sore throat Cardiovascular: Denies palpitation, chest discomfort or lower extremity swelling Gastrointestinal:  Denies nausea, heartburn or change in bowel habits Skin: Denies abnormal skin rashes Lymphatics: Denies new lymphadenopathy or easy bruising Neurological:Denies numbness, tingling or new weaknesses Behavioral/Psych: Mood is stable, no new changes  All other systems were reviewed with the patient and are negative.  I have reviewed the past medical history, past surgical history, social history and family history with the patient and they are unchanged from previous note.  ALLERGIES:  is allergic to no known allergies.  MEDICATIONS:  Current Outpatient Medications  Medication Sig Dispense Refill  . albuterol (PROVENTIL  HFA;VENTOLIN HFA) 108 (90 Base) MCG/ACT inhaler Inhale 2 puffs into the lungs every 4 (four) hours as needed for wheezing or shortness of breath. 1 Inhaler 5  . cyanocobalamin 500 MCG tablet Take 500 mcg by mouth daily.    . ferrous sulfate 325 (65 FE) MG tablet Take 325 mg by mouth daily with breakfast.    . folic acid (FOLVITE) 1 MG tablet Take 1 mg by mouth daily.    . Glycopyrrolate-Formoterol (BEVESPI AEROSPHERE) 9-4.8 MCG/ACT AERO Inhale 2 puffs into the lungs 2 (two) times daily. 2 Inhaler 0  . levETIRAcetam (KEPPRA) 500 MG tablet Take 1 tablet (500 mg total) by mouth 2 (two) times daily. 60 tablet 3  . lidocaine-prilocaine (EMLA) cream Apply to affected area once 30 g 3  . vitamin E 1000 UNIT capsule Take 1,000 Units by mouth daily.      No current facility-administered medications for this visit.     PHYSICAL EXAMINATION: ECOG PERFORMANCE STATUS: 1 - Symptomatic but completely ambulatory  Vitals:   06/08/17 1000  BP: 129/71  Pulse: 96  Resp: 18  Temp: (!) 97.5 F (36.4 C)  SpO2: 96%   Filed Weights   06/08/17 1000  Weight: 106 lb 9.6 oz (48.4 kg)    GENERAL:alert, no distress and comfortable.  He looks thin and frail SKIN: skin color, texture, turgor are normal, no rashes or significant lesions EYES: normal, Conjunctiva are pink and non-injected, sclera  clear OROPHARYNX:no exudate, no erythema and lips, buccal mucosa, and tongue normal  NECK: supple, thyroid normal size, non-tender, without nodularity LYMPH:  no palpable lymphadenopathy in the cervical, axillary or inguinal LUNGS: clear to auscultation and percussion with normal breathing effort HEART: regular rate & rhythm and no murmurs and no lower extremity edema ABDOMEN:abdomen soft, non-tender and normal bowel sounds Musculoskeletal:no cyanosis of digits with digital clubbing NEURO: alert & oriented x 3 with fluent speech, no focal motor/sensory deficits  LABORATORY DATA:  I have reviewed the data as listed     Component Value Date/Time   NA 138 06/08/2017 0929   NA 142 05/03/2017 1114   K 4.5 06/08/2017 0929   K 4.5 05/03/2017 1114   CL 108 06/08/2017 0929   CL 107 09/19/2012 1325   CO2 22 06/08/2017 0929   CO2 21 (L) 05/03/2017 1114   GLUCOSE 83 06/08/2017 0929   GLUCOSE 88 05/03/2017 1114   GLUCOSE 97 09/19/2012 1325   BUN 20 06/08/2017 0929   BUN 18.5 05/03/2017 1114   CREATININE 0.85 06/08/2017 0929   CREATININE 0.9 05/03/2017 1114   CALCIUM 8.9 06/08/2017 0929   CALCIUM 8.9 05/03/2017 1114   PROT 8.3 06/08/2017 0929   PROT 8.6 (H) 05/03/2017 1114   ALBUMIN 3.0 (L) 06/08/2017 0929   ALBUMIN 3.2 (L) 05/03/2017 1114   AST 11 06/08/2017 0929   AST 9 05/03/2017 1114   ALT <6 06/08/2017 0929   ALT <6 05/03/2017 1114   ALKPHOS 112 06/08/2017 0929   ALKPHOS 126 05/03/2017 1114   BILITOT 0.4 06/08/2017 0929   BILITOT 0.34 05/03/2017 1114   GFRNONAA >60 06/08/2017 0929   GFRAA >60 06/08/2017 0929    No results found for: SPEP, UPEP  Lab Results  Component Value Date   WBC 21.2 (H) 06/08/2017   NEUTROABS 19.7 (H) 06/08/2017   HGB 8.6 (L) 06/08/2017   HCT 26.1 (L) 06/08/2017   MCV 93.2 06/08/2017   PLT 162 06/08/2017      Chemistry      Component Value Date/Time   NA 138 06/08/2017 0929   NA 142 05/03/2017 1114   K 4.5 06/08/2017 0929   K 4.5 05/03/2017 1114   CL 108 06/08/2017 0929   CL 107 09/19/2012 1325   CO2 22 06/08/2017 0929   CO2 21 (L) 05/03/2017 1114   BUN 20 06/08/2017 0929   BUN 18.5 05/03/2017 1114   CREATININE 0.85 06/08/2017 0929   CREATININE 0.9 05/03/2017 1114      Component Value Date/Time   CALCIUM 8.9 06/08/2017 0929   CALCIUM 8.9 05/03/2017 1114   ALKPHOS 112 06/08/2017 0929   ALKPHOS 126 05/03/2017 1114   AST 11 06/08/2017 0929   AST 9 05/03/2017 1114   ALT <6 06/08/2017 0929   ALT <6 05/03/2017 1114   BILITOT 0.4 06/08/2017 0929   BILITOT 0.34 05/03/2017 1114       ASSESSMENT & PLAN:  Cancer of upper lobe of left lung  ([place]) Unfortunately, PET/CT scan was not done I will get scheduler to reschedule his PET scan We would proceed with chemotherapy today I will see him back next week to review results of PET scan  MDS (myelodysplastic syndrome), low grade ([place]) He is not symptomatic from anemia He will get darbepoetin injection today He does not need blood transfusion  COPD, mild ([place]) He has chronic COPD without recent exacerbation He does not need antibiotic treatment   No orders of the defined types were placed  in this encounter.  All questions were answered. The patient knows to call the clinic with any problems, questions or concerns. No barriers to learning was detected. I spent 15 minutes counseling the patient face to face. The total time spent in the appointment was 20 minutes and more than 50% was on counseling and review of test results     [name], MD 06/08/2017 10:53 AM

## 2017-06-08 NOTE — Telephone Encounter (Signed)
Gave patient avs report and appointments for February. Per NG auth for pet scan pending - no chemo until after patient has pet scan.

## 2017-06-09 ENCOUNTER — Telehealth: Payer: Self-pay | Admitting: Hematology and Oncology

## 2017-06-09 NOTE — Telephone Encounter (Signed)
Left message for patient regarding upcoming February appointments per 1/31 sch message.

## 2017-06-14 ENCOUNTER — Ambulatory Visit (HOSPITAL_COMMUNITY): Payer: Medicare Other

## 2017-06-14 ENCOUNTER — Ambulatory Visit: Payer: Self-pay | Admitting: Hematology and Oncology

## 2017-06-14 ENCOUNTER — Ambulatory Visit: Payer: Self-pay

## 2017-06-14 ENCOUNTER — Other Ambulatory Visit: Payer: Self-pay

## 2017-06-15 ENCOUNTER — Telehealth: Payer: Self-pay | Admitting: Hematology and Oncology

## 2017-06-15 NOTE — Telephone Encounter (Signed)
Left message for patient regarding upcoming February appointments per 2/7 sch message.

## 2017-06-16 ENCOUNTER — Ambulatory Visit: Payer: Self-pay

## 2017-06-16 ENCOUNTER — Ambulatory Visit: Payer: Self-pay | Admitting: Hematology and Oncology

## 2017-06-21 ENCOUNTER — Inpatient Hospital Stay: Payer: Medicare Other

## 2017-06-21 ENCOUNTER — Inpatient Hospital Stay: Payer: Medicare Other | Attending: Hematology and Oncology

## 2017-06-21 ENCOUNTER — Other Ambulatory Visit: Payer: Self-pay | Admitting: Hematology and Oncology

## 2017-06-21 DIAGNOSIS — D4620 Refractory anemia with excess of blasts, unspecified: Secondary | ICD-10-CM

## 2017-06-21 DIAGNOSIS — J449 Chronic obstructive pulmonary disease, unspecified: Secondary | ICD-10-CM | POA: Insufficient documentation

## 2017-06-21 DIAGNOSIS — C3412 Malignant neoplasm of upper lobe, left bronchus or lung: Secondary | ICD-10-CM | POA: Diagnosis present

## 2017-06-21 DIAGNOSIS — R5383 Other fatigue: Secondary | ICD-10-CM | POA: Diagnosis not present

## 2017-06-21 DIAGNOSIS — T451X5S Adverse effect of antineoplastic and immunosuppressive drugs, sequela: Secondary | ICD-10-CM | POA: Diagnosis not present

## 2017-06-21 DIAGNOSIS — Z9221 Personal history of antineoplastic chemotherapy: Secondary | ICD-10-CM | POA: Diagnosis not present

## 2017-06-21 DIAGNOSIS — R42 Dizziness and giddiness: Secondary | ICD-10-CM | POA: Insufficient documentation

## 2017-06-21 DIAGNOSIS — Z923 Personal history of irradiation: Secondary | ICD-10-CM | POA: Insufficient documentation

## 2017-06-21 DIAGNOSIS — Z79899 Other long term (current) drug therapy: Secondary | ICD-10-CM | POA: Insufficient documentation

## 2017-06-21 DIAGNOSIS — D61818 Other pancytopenia: Secondary | ICD-10-CM | POA: Diagnosis not present

## 2017-06-21 DIAGNOSIS — D6481 Anemia due to antineoplastic chemotherapy: Secondary | ICD-10-CM | POA: Diagnosis not present

## 2017-06-21 LAB — CBC WITH DIFFERENTIAL/PLATELET
BASOS ABS: 0 10*3/uL (ref 0.0–0.1)
Basophils Relative: 0 %
EOS ABS: 0 10*3/uL (ref 0.0–0.5)
EOS PCT: 0 %
HCT: 21 % — ABNORMAL LOW (ref 38.4–49.9)
Hemoglobin: 6.7 g/dL — CL (ref 13.0–17.1)
Lymphocytes Relative: 7 %
Lymphs Abs: 0.5 10*3/uL — ABNORMAL LOW (ref 0.9–3.3)
MCH: 30.6 pg (ref 27.2–33.4)
MCHC: 31.9 g/dL — ABNORMAL LOW (ref 32.0–36.0)
MCV: 95.9 fL (ref 79.3–98.0)
Monocytes Absolute: 0.8 10*3/uL (ref 0.1–0.9)
Monocytes Relative: 11 %
Neutro Abs: 5.9 10*3/uL (ref 1.5–6.5)
Neutrophils Relative %: 82 %
PLATELETS: 147 10*3/uL (ref 140–400)
RBC: 2.19 MIL/uL — AB (ref 4.20–5.82)
RDW: 20 % — ABNORMAL HIGH (ref 11.0–14.6)
WBC: 7.2 10*3/uL (ref 4.0–10.3)

## 2017-06-21 LAB — COMPREHENSIVE METABOLIC PANEL
AST: 9 U/L (ref 5–34)
Albumin: 2.9 g/dL — ABNORMAL LOW (ref 3.5–5.0)
Alkaline Phosphatase: 89 U/L (ref 40–150)
Anion gap: 9 (ref 3–11)
BILIRUBIN TOTAL: 0.3 mg/dL (ref 0.2–1.2)
BUN: 18 mg/dL (ref 7–26)
CO2: 22 mmol/L (ref 22–29)
CREATININE: 0.82 mg/dL (ref 0.70–1.30)
Calcium: 8.7 mg/dL (ref 8.4–10.4)
Chloride: 111 mmol/L — ABNORMAL HIGH (ref 98–109)
GFR calc Af Amer: >60 mL/min (ref >60)
Glucose, Bld: 84 mg/dL (ref 70–140)
Potassium: 4.9 mmol/L (ref 3.5–5.1)
Sodium: 142 mmol/L (ref 136–145)
TOTAL PROTEIN: 8 g/dL (ref 6.4–8.3)

## 2017-06-21 LAB — SAMPLE TO BLOOD BANK

## 2017-06-21 LAB — PREPARE RBC (CROSSMATCH)

## 2017-06-21 MED ORDER — ACETAMINOPHEN 325 MG PO TABS
650.0000 mg | ORAL_TABLET | Freq: Once | ORAL | Status: AC
  Administered 2017-06-21: 650 mg via ORAL

## 2017-06-21 MED ORDER — SODIUM CHLORIDE 0.9% FLUSH
10.0000 mL | Freq: Once | INTRAVENOUS | Status: AC
  Administered 2017-06-21: 10 mL
  Filled 2017-06-21: qty 10

## 2017-06-21 MED ORDER — ACETAMINOPHEN 325 MG PO TABS
ORAL_TABLET | ORAL | Status: AC
  Filled 2017-06-21: qty 2

## 2017-06-21 MED ORDER — SODIUM CHLORIDE 0.9% FLUSH
10.0000 mL | INTRAVENOUS | Status: AC | PRN
  Administered 2017-06-21: 10 mL
  Filled 2017-06-21: qty 10

## 2017-06-21 MED ORDER — DIPHENHYDRAMINE HCL 25 MG PO CAPS
25.0000 mg | ORAL_CAPSULE | Freq: Once | ORAL | Status: AC
  Administered 2017-06-21: 25 mg via ORAL

## 2017-06-21 MED ORDER — DIPHENHYDRAMINE HCL 25 MG PO CAPS
ORAL_CAPSULE | ORAL | Status: AC
  Filled 2017-06-21: qty 1

## 2017-06-21 MED ORDER — SODIUM CHLORIDE 0.9 % IV SOLN
250.0000 mL | Freq: Once | INTRAVENOUS | Status: AC
  Administered 2017-06-21: 250 mL via INTRAVENOUS

## 2017-06-21 MED ORDER — HEPARIN SOD (PORK) LOCK FLUSH 100 UNIT/ML IV SOLN
500.0000 [IU] | Freq: Every day | INTRAVENOUS | Status: AC | PRN
  Administered 2017-06-21: 500 [IU]
  Filled 2017-06-21: qty 5

## 2017-06-21 NOTE — Patient Instructions (Signed)

## 2017-06-22 LAB — BPAM RBC
BLOOD PRODUCT EXPIRATION DATE: 201902252359
ISSUE DATE / TIME: 201902131223
Unit Type and Rh: 5100

## 2017-06-22 LAB — TYPE AND SCREEN
ABO/RH(D): O POS
Antibody Screen: NEGATIVE
DONOR AG TYPE: NEGATIVE
UNIT DIVISION: 0

## 2017-06-26 ENCOUNTER — Encounter (HOSPITAL_COMMUNITY)
Admission: RE | Admit: 2017-06-26 | Discharge: 2017-06-26 | Disposition: A | Discharge status: Home / Self Care | Payer: Medicare Other | Source: Ambulatory Visit | Attending: Hematology and Oncology | Admitting: Hematology and Oncology

## 2017-06-26 DIAGNOSIS — C3412 Malignant neoplasm of upper lobe, left bronchus or lung: Secondary | ICD-10-CM | POA: Insufficient documentation

## 2017-06-26 DIAGNOSIS — C3490 Malignant neoplasm of unspecified part of unspecified bronchus or lung: Secondary | ICD-10-CM | POA: Diagnosis not present

## 2017-06-26 LAB — GLUCOSE, CAPILLARY: GLUCOSE-CAPILLARY: 83 mg/dL (ref 65–99)

## 2017-06-26 MED ORDER — FLUDEOXYGLUCOSE F - 18 (FDG) INJECTION
5.2000 | Freq: Once | INTRAVENOUS | Status: AC | PRN
  Administered 2017-06-26: 5.2 via INTRAVENOUS

## 2017-06-28 ENCOUNTER — Inpatient Hospital Stay: Payer: Medicare Other

## 2017-06-28 ENCOUNTER — Telehealth: Payer: Self-pay | Admitting: Hematology and Oncology

## 2017-06-28 ENCOUNTER — Inpatient Hospital Stay (HOSPITAL_BASED_OUTPATIENT_CLINIC_OR_DEPARTMENT_OTHER): Payer: Medicare Other | Admitting: Hematology and Oncology

## 2017-06-28 ENCOUNTER — Encounter: Payer: Self-pay | Admitting: Hematology and Oncology

## 2017-06-28 VITALS — BP 128/79 | HR 90 | Temp 97.6°F | Resp 24 | Ht 72.0 in | Wt 104.3 lb

## 2017-06-28 DIAGNOSIS — R5383 Other fatigue: Secondary | ICD-10-CM

## 2017-06-28 DIAGNOSIS — D61818 Other pancytopenia: Secondary | ICD-10-CM | POA: Diagnosis not present

## 2017-06-28 DIAGNOSIS — Z9221 Personal history of antineoplastic chemotherapy: Secondary | ICD-10-CM | POA: Diagnosis not present

## 2017-06-28 DIAGNOSIS — Z79899 Other long term (current) drug therapy: Secondary | ICD-10-CM

## 2017-06-28 DIAGNOSIS — D6481 Anemia due to antineoplastic chemotherapy: Secondary | ICD-10-CM | POA: Diagnosis not present

## 2017-06-28 DIAGNOSIS — D4620 Refractory anemia with excess of blasts, unspecified: Secondary | ICD-10-CM

## 2017-06-28 DIAGNOSIS — T451X5S Adverse effect of antineoplastic and immunosuppressive drugs, sequela: Secondary | ICD-10-CM

## 2017-06-28 DIAGNOSIS — C3412 Malignant neoplasm of upper lobe, left bronchus or lung: Secondary | ICD-10-CM

## 2017-06-28 DIAGNOSIS — T451X5A Adverse effect of antineoplastic and immunosuppressive drugs, initial encounter: Principal | ICD-10-CM

## 2017-06-28 DIAGNOSIS — R64 Cachexia: Secondary | ICD-10-CM

## 2017-06-28 DIAGNOSIS — Z7189 Other specified counseling: Secondary | ICD-10-CM

## 2017-06-28 DIAGNOSIS — R42 Dizziness and giddiness: Secondary | ICD-10-CM | POA: Diagnosis not present

## 2017-06-28 DIAGNOSIS — J449 Chronic obstructive pulmonary disease, unspecified: Secondary | ICD-10-CM | POA: Diagnosis not present

## 2017-06-28 DIAGNOSIS — Z923 Personal history of irradiation: Secondary | ICD-10-CM

## 2017-06-28 LAB — CBC WITH DIFFERENTIAL/PLATELET
Basophils Absolute: 0 10*3/uL (ref 0.0–0.1)
Basophils Relative: 1 %
Eosinophils Absolute: 0.1 10*3/uL (ref 0.0–0.5)
Eosinophils Relative: 1 %
HEMATOCRIT: 24.4 % — AB (ref 38.4–49.9)
HEMOGLOBIN: 8.2 g/dL — AB (ref 13.0–17.1)
LYMPHS ABS: 0.4 10*3/uL — AB (ref 0.9–3.3)
Lymphocytes Relative: 7 %
MCH: 31.2 pg (ref 27.2–33.4)
MCHC: 33.7 g/dL (ref 32.0–36.0)
MCV: 92.8 fL (ref 79.3–98.0)
MONOS PCT: 8 %
Monocytes Absolute: 0.5 10*3/uL (ref 0.1–0.9)
NEUTROS ABS: 5.3 10*3/uL (ref 1.5–6.5)
NEUTROS PCT: 83 %
Platelets: 205 10*3/uL (ref 140–400)
RBC: 2.63 MIL/uL — AB (ref 4.20–5.82)
RDW: 15.2 % — ABNORMAL HIGH (ref 11.0–14.6)
WBC: 6.3 10*3/uL (ref 4.0–10.3)

## 2017-06-28 LAB — COMPREHENSIVE METABOLIC PANEL
ALBUMIN: 2.9 g/dL — AB (ref 3.5–5.0)
ALK PHOS: 91 U/L (ref 40–150)
ANION GAP: 8 (ref 3–11)
AST: 12 U/L (ref 5–34)
BILIRUBIN TOTAL: 0.4 mg/dL (ref 0.2–1.2)
BUN: 17 mg/dL (ref 7–26)
CALCIUM: 8.9 mg/dL (ref 8.4–10.4)
CO2: 23 mmol/L (ref 22–29)
CREATININE: 0.8 mg/dL (ref 0.70–1.30)
Chloride: 108 mmol/L (ref 98–109)
GFR calc Af Amer: >60 mL/min (ref >60)
GFR calc non Af Amer: >60 mL/min (ref >60)
GLUCOSE: 79 mg/dL (ref 70–140)
Potassium: 5 mmol/L (ref 3.5–5.1)
Sodium: 139 mmol/L (ref 136–145)
Total Protein: 8.2 g/dL (ref 6.4–8.3)

## 2017-06-28 LAB — PREPARE RBC (CROSSMATCH)

## 2017-06-28 LAB — SAMPLE TO BLOOD BANK

## 2017-06-28 MED ORDER — HEPARIN SOD (PORK) LOCK FLUSH 100 UNIT/ML IV SOLN
250.0000 [IU] | INTRAVENOUS | Status: AC | PRN
  Administered 2017-06-28: 500 [IU]
  Filled 2017-06-28: qty 5

## 2017-06-28 MED ORDER — SODIUM CHLORIDE 0.9% FLUSH
10.0000 mL | Freq: Once | INTRAVENOUS | Status: AC
  Administered 2017-06-28: 10 mL
  Filled 2017-06-28: qty 10

## 2017-06-28 MED ORDER — DIPHENHYDRAMINE HCL 25 MG PO CAPS
25.0000 mg | ORAL_CAPSULE | Freq: Once | ORAL | Status: AC
  Administered 2017-06-28: 25 mg via ORAL

## 2017-06-28 MED ORDER — ACETAMINOPHEN 325 MG PO TABS
650.0000 mg | ORAL_TABLET | Freq: Once | ORAL | Status: AC
  Administered 2017-06-28: 650 mg via ORAL

## 2017-06-28 MED ORDER — DIPHENHYDRAMINE HCL 25 MG PO CAPS
ORAL_CAPSULE | ORAL | Status: AC
Start: 2017-06-28 — End: 2017-06-28
  Filled 2017-06-28: qty 1

## 2017-06-28 MED ORDER — ACETAMINOPHEN 325 MG PO TABS
ORAL_TABLET | ORAL | Status: AC
  Filled 2017-06-28: qty 2

## 2017-06-28 MED ORDER — SODIUM CHLORIDE 0.9 % IV SOLN
250.0000 mL | Freq: Once | INTRAVENOUS | Status: AC
  Administered 2017-06-28: 250 mL via INTRAVENOUS

## 2017-06-28 MED ORDER — SODIUM CHLORIDE 0.9% FLUSH
10.0000 mL | INTRAVENOUS | Status: AC | PRN
  Administered 2017-06-28: 10 mL
  Filled 2017-06-28: qty 10

## 2017-06-28 NOTE — Assessment & Plan Note (Signed)
The patient is cachectic, likely caused by side effects of chemotherapy as well As above, we will proceed with chemotherapy holiday and he will continue to increase nutritional supplement as tolerated

## 2017-06-28 NOTE — Assessment & Plan Note (Signed)
He continues to have chronic mild cough from COPD exacerbation He does not have signs or symptoms of infection He does not need antibiotic treatment

## 2017-06-28 NOTE — Assessment & Plan Note (Signed)
I have extensive discussion with the patient and his wife regarding the goals of care Due to stability of disease seen on PET scan, I recommend chemotherapy holiday now They are aware that his disease is incurable and he will likely need to resume treatment in a few months

## 2017-06-28 NOTE — Progress Notes (Signed)
[place] OFFICE PROGRESS NOTE  Patient Care Team: [name], MD as PCP - General (Family Medicine) [name], MD as Consulting Physician (Hematology and Oncology)  ASSESSMENT & PLAN:  Cancer of upper lobe of left lung [facility]) I have reviewed imaging study with the patient and wife in great detail The patient has near complete response to treatment He has fatigue, severe pancytopenia and has became transfusion dependent and suffered from poor memory I recommend chemotherapy holiday I plan to continue aggressive supportive care and repeat imaging study to reassess disease status in 3 months, due at the end of May  MDS (myelodysplastic syndrome), low grade ([place]) We discussed some of the risks, benefits, and alternatives of blood transfusions. The patient is symptomatic from anemia and the hemoglobin level is critically low.  Some of the side-effects to be expected including risks of transfusion reactions, chills, infection, syndrome of volume overload and risk of hospitalization from various reasons and the patient is willing to proceed and went ahead to sign consent today. We will proceed with 1 unit of blood transfusion I will schedule darbepoetin injection and blood draws every 2 weeks and I plan to see him back in 2 months  Malignant cachexia (HCC) The patient is cachectic, likely caused by side effects of chemotherapy as well As above, we will proceed with chemotherapy holiday and he will continue to increase nutritional supplement as tolerated  COPD, mild ([place]) He continues to have chronic mild cough from COPD exacerbation He does not have signs or symptoms of infection He does not need antibiotic treatment  Goals of care, counseling/discussion I have extensive discussion with the patient and his wife regarding the goals of care Due to stability of disease seen on PET scan, I recommend chemotherapy holiday now They are aware that his disease is incurable and  he will likely need to resume treatment in a few months   Orders Placed This Encounter  Procedures  . Type and screen    Standing Status:   Future    Number of Occurrences:   1    Standing Expiration Date:   06/28/2018  . Prepare RBC    Standing Status:   Standing    Number of Occurrences:   1    Order Specific Question:   # of Units    Answer:   1 unit    Order Specific Question:   Transfusion Indications    Answer:   Symptomatic Anemia    Order Specific Question:   Special Requirements    Answer:   Irradiated    Order Specific Question:   If emergent release call blood bank    Answer:   Not emergent release    INTERVAL HISTORY: Please see below for problem oriented charting. He returns with his wife for further follow-up He has multiple delays of getting PET CT scan done due to poor memory and transportations issue He complained of fatigue, weak and dizzy He continues to have mild chronic productive cough of white sputum He denies recent smoking He denies peripheral neuropathy from treatment The patient denies any recent signs or symptoms of bleeding such as spontaneous epistaxis, hematuria or hematochezia.   SUMMARY OF ONCOLOGIC HISTORY: Oncology History   MDS, R-IPSS score of 3, low risk (Hg 8.9, +16 chromosome on BM, 2% blast count)   Squamous cell carcinoma of the epiglottis   Primary site: Larynx - Supraglottis (Left)   Staging method: AJCC 7th Edition   Clinical: Stage I (  T1, N0, M0) signed by [name], MD on 04/30/2013 12:49 PM   Pathologic: Stage I (T1, N0, cM0) signed by [name], MD on 04/30/2013 12:49 PM   Summary: Stage I (T1, N0, cM0)       MDS (myelodysplastic syndrome), low grade ([place])   02/01/2007 Bone Marrow Biopsy    BM biopsy was abnormal, overall probable low grade MDS      03/23/2011 - 02/06/2013 Chemotherapy    [name], discontinued due to diagnosis of laryngeal ca      09/06/2013 Bone Marrow Biopsy    Repeat bone marrow  aspirate and biopsy confirmed low-grade myelodysplastic syndrome.      09/17/2013 -  Chemotherapy    The patient resumed darbepoetin injections to treat the anemia.       History of head and neck cancer   03/06/2013 Procedure    Biopsy from epiglottic region showed invasive [facility]      04/25/2013 Imaging    Ct scan showed no other involvement in the LN      05/13/2013 - 06/28/2013 Radiation Therapy    He received radiation therapy, 70 gray in 35 fractions to the larynx      06/09/2016 Imaging    CT scan of the chest showed Bilateral spiculated soft tissue pulmonary masses, highly suspicious for multifocal malignancy. Borderline enlarged left-sided mediastinal lymphadenopathy. Background of severe emphysematous changes, chronic cylindrical bronchiectasis and hyperinflation of the lungs. Findings consistent with longstanding COPD. Diffusely thickened interstitial markings. This may represent chronic interstitial lung changes, however lymphangitic spread of malignancy cannot be excluded. Anterior compression deformities of several of the thoracic vertebral bodies, with heterogeneous appearance of T7 and T9 vertebral body. This may represent degenerative changes, however osseous metastatic disease cannot be excluded.       07/11/2016 PET scan    There are 2 nodules in the left upper lobe and 2 nodules within the right lower lobe which have spiculated margin and exhibit intense radiotracer uptake compatible with multifocal pulmonary metastasis versus multiple synchronous primary pulmonary neoplasms. 2. Emphysema 3. Aortic atherosclerosis and multi vessel coronary artery calcification.       Cancer of upper lobe of left lung ([place])   06/09/2016 Imaging    CT chest: Bilateral spiculated soft tissue pulmonary masses, highly suspicious for multifocal malignancy. Borderline enlarged left-sided mediastinal lymphadenopathy. Background of severe emphysematous changes, chronic cylindrical bronchiectasis and  hyperinflation of the lungs. Findings consistent with longstanding COPD. Diffusely thickened interstitial markings. This may represent chronic interstitial lung changes, however lymphangitic spread of malignancy cannot be excluded. Anterior compression deformities of several of the thoracic vertebral bodies, with heterogeneous appearance of T7 and T9 vertebral body. This may represent degenerative changes, however osseous metastatic disease cannot be excluded.       06/29/2016 Imaging    MRI brain: No evidence of metastatic disease or recent infarction. Advanced generalized brain atrophy with moderate to marked chronic small-vessel ischemic changes throughout. FLAIR imaging appears to show scattered gyri showing FLAIR hyperintensity. These abnormalities are not confirmed with restricted diffusion or contrast enhancement. Therefore, we are not certain that this is not artifactual. The differential diagnosis does include true pathology such as posterior reversible encephalopathy, viral or prion disease, postictal change in post chemotherapy change. If there is concern about active CNS pathology, re- scanning in 4-6 weeks could be useful.      07/27/2016 Imaging    CT chest: Bilateral pulmonary lesions. These are suspicious for metastatic disease. Lesions have minimally changed but  there may be slight enlargement of the cavitary lesion in the right lower lobe. Severe emphysematous changes.       08/10/2016 Pathology Results    1. Lung, biopsy, RUL, (apical segment) - SCANT BENIGN LUNG PARENCHYMA. - THERE IS NO EVIDENCE OF MALIGNANCY. 2. Lung, biopsy, LUL lingula - SQUAMOUS CELL CARCINOMA. - SEE COMMENT. 3. Lung, biopsy, LUL - ADENOCARCINOMA. - SEE COMMENT. Microscopic Comment 2. The malignant cells are positive for cytokeratin 56 and p63. They are negative for TTF-1. The findings are consistent with squamous cell carcinoma. There is likely insufficient tissue remaining for additional studies,  if requested. 3. The malignant cells are positive for TTF-1 and negative for p63 and cytokeratin 5/6. The findings are consistent with adenocarcinoma.      08/10/2016 Procedure    The targets were defined as follows: Left upper lobe nodule, target 1 Lingular nodule, target 2 Proximal right lower lobe nodule, target 3 More distal right lower lobe, nodule  The extendable working channel was secured into place and the locator guide was withdrawn. Under fluoroscopic guidance transbronchial needle brushings, transbronchial Wang needle biopsies, and transbronchial forceps biopsies were performed in the LUL (target 1) and the lingula (target 2) to be sent for cytology and pathology. A bronchioalveolar lavage was performed in the lingula in the vicinity of target 2  and sent for cytology and microbiology (bacterial, fungal, AFB smears and cultures).  Fiducial markers were then placed around target 1, target 2, and in the right lower lobe in the vicinity of both target 3 and target 4 to be used for possible radiation therapy should this be indicated. At the end of the procedure a general airway inspection was performed and there was no evidence of active bleeding. The bronchoscope was removed.  The patient tolerated the procedure well. There was no significant blood loss and there were no obvious complications. A post-procedural chest x-ray is pending.  Samples: 1. Transbronchial needle brushings from targets 1 and 2 2. Transbronchial Wang needle biopsies from targets 1 and 2 3. Transbronchial forceps biopsies from targets 1 and 2 4. Bronchoalveolar lavage from lingula, target 2 5. Endobronchial biopsies from RUL apical segment 6. Endobronchial brushings from right upper lobe apical segment       08/10/2016 Genetic Testing    Patient has genetic testing done for [employer] and PD-L1 testing Results revealed PD-L1 testing is negative 0%      08/31/2016 Procedure    Technically successful right  IJ power-injectable port catheter placement. Ready for routine use      09/21/2016 - 06/08/2017 Chemotherapy    The patient had chemotherapy with carboplatin and Taxol.      11/30/2016 PET scan    1. Interval resection of the hypermetabolic lingular nodule. 2. The 3 other spiculated, hypermetabolic nodules within the left upper and right lower lobes are slightly smaller with decreased metabolic activity, consistent with some response to interval therapy. 3. No progressive disease      03/06/2017 PET scan    1. Mixed appearance, with significant improvement in the left upper lobe nodule ; stable appearance of the more cephalad right lower lobe nodule; but enlargement and increased in metabolic activity of the more inferior right lower lobe nodule which appears cavitary. No new nodule. 2. Previously there was some moderate increase in diffuse marrow activity. That has increased further, and could be a response from prior granulocyte/marrow stimulation, although I cannot completely exclude the possibility that this is related to the patient's myelodysplastic  syndrome. No CT correlate. 3. Other imaging findings of potential clinical significance: Aortic Atherosclerosis (ICD10-I70.0) and Emphysema (ICD10-J43.9). Coronary atherosclerosis. Low-density blood pool suggests anemia. Chronic right upper lobe scarring. Mucus in both mainstem bronchi. Suspected left renal cysts. Indistinct but likely enlarged prostate gland.      04/06/2017 - 04/18/2017 Radiation Therapy    He received SBRT treatment Radiation treatment dates:   04/06/17 - 04/18/17  Site/dose:   Right SBRT Lung// 50 Gy in 5 fx  Beams/energy:   Photon // SRBT/ SBT-3D       06/26/2017 PET scan    1. Clear interval response to therapy of the 2 right lower lobe pulmonary nodules, each decreased in both size and hypermetabolism. 2. Stable size and hypermetabolic activity in the left upper lobe nodule. 3. Diffusely decreased FDG  accumulation within the marrow space. 4. Symmetric uptake in both parotid glands is likely physiologic. Given the symmetry, this is most likely physiologic or inflammatory. 5. Prostatomegaly with marked bladder distention. Component of bladder outlet obstruction suspected. 6. Emphysema. (ICD10-J43.9) 7. Aortic Atherosclerois (ICD10-170.0)       REVIEW OF SYSTEMS:   Constitutional: Denies fevers, chills or abnormal weight loss Eyes: Denies blurriness of vision Ears, nose, mouth, throat, and face: Denies mucositis or sore throat Respiratory: Denies cough, dyspnea or wheezes Cardiovascular: Denies palpitation, chest discomfort or lower extremity swelling Gastrointestinal:  Denies nausea, heartburn or change in bowel habits Skin: Denies abnormal skin rashes Lymphatics: Denies new lymphadenopathy or easy bruising Neurological:Denies numbness, tingling or new weaknesses Behavioral/Psych: Mood is stable, no new changes  All other systems were reviewed with the patient and are negative.  I have reviewed the past medical history, past surgical history, social history and family history with the patient and they are unchanged from previous note.  ALLERGIES:  is allergic to no known allergies.  MEDICATIONS:  Current Outpatient Medications  Medication Sig Dispense Refill  . albuterol (PROVENTIL HFA;VENTOLIN HFA) 108 (90 Base) MCG/ACT inhaler Inhale 2 puffs into the lungs every 4 (four) hours as needed for wheezing or shortness of breath. 1 Inhaler 5  . cyanocobalamin 500 MCG tablet Take 500 mcg by mouth daily.    . ferrous sulfate 325 (65 FE) MG tablet Take 325 mg by mouth daily with breakfast.    . folic acid (FOLVITE) 1 MG tablet Take 1 mg by mouth daily.    . Glycopyrrolate-Formoterol (BEVESPI AEROSPHERE) 9-4.8 MCG/ACT AERO Inhale 2 puffs into the lungs 2 (two) times daily. 2 Inhaler 0  . levETIRAcetam (KEPPRA) 500 MG tablet Take 1 tablet (500 mg total) by mouth 2 (two) times daily. 60  tablet 3  . vitamin E 1000 UNIT capsule Take 1,000 Units by mouth daily.      No current facility-administered medications for this visit.     PHYSICAL EXAMINATION: ECOG PERFORMANCE STATUS: 2 - Symptomatic, <50% confined to bed  Vitals:   06/28/17 1045  BP: 128/79  Pulse: 90  Resp: (!) 24  Temp: 97.6 F (36.4 C)  SpO2: 96%   Filed Weights   06/28/17 1045  Weight: 104 lb 4.8 oz (47.3 kg)    GENERAL:alert, no distress and comfortable.  He looks thin and cachectic SKIN: skin color, texture, turgor are normal, no rashes or significant lesions EYES: normal, Conjunctiva are pink and non-injected, sclera clear OROPHARYNX:no exudate, no erythema and lips, buccal mucosa, and tongue normal  NECK: supple, thyroid normal size, non-tender, without nodularity LYMPH:  no palpable lymphadenopathy in the cervical, axillary or inguinal  LUNGS: clear to auscultation and percussion with normal breathing effort HEART: regular rate & rhythm and no murmurs and no lower extremity edema ABDOMEN:abdomen soft, non-tender and normal bowel sounds Musculoskeletal: He has chronic digital clubbing NEURO: alert & oriented x 3 with fluent speech, no focal motor/sensory deficits  LABORATORY DATA:  I have reviewed the data as listed    Component Value Date/Time   NA 139 06/28/2017 0951   NA 142 05/03/2017 1114   K 5.0 06/28/2017 0951   K 4.5 05/03/2017 1114   CL 108 06/28/2017 0951   CL 107 09/19/2012 1325   CO2 23 06/28/2017 0951   CO2 21 (L) 05/03/2017 1114   GLUCOSE 79 06/28/2017 0951   GLUCOSE 88 05/03/2017 1114   GLUCOSE 97 09/19/2012 1325   BUN 17 06/28/2017 0951   BUN 18.5 05/03/2017 1114   CREATININE 0.80 06/28/2017 0951   CREATININE 0.9 05/03/2017 1114   CALCIUM 8.9 06/28/2017 0951   CALCIUM 8.9 05/03/2017 1114   PROT 8.2 06/28/2017 0951   PROT 8.6 (H) 05/03/2017 1114   ALBUMIN 2.9 (L) 06/28/2017 0951   ALBUMIN 3.2 (L) 05/03/2017 1114   AST 12 06/28/2017 0951   AST 9 05/03/2017 1114    ALT <6 06/28/2017 0951   ALT <6 05/03/2017 1114   ALKPHOS 91 06/28/2017 0951   ALKPHOS 126 05/03/2017 1114   BILITOT 0.4 06/28/2017 0951   BILITOT 0.34 05/03/2017 1114   GFRNONAA >60 06/28/2017 0951   GFRAA >60 06/28/2017 0951    No results found for: SPEP, UPEP  Lab Results  Component Value Date   WBC 6.3 06/28/2017   NEUTROABS 5.3 06/28/2017   HGB 8.2 (L) 06/28/2017   HCT 24.4 (L) 06/28/2017   MCV 92.8 06/28/2017   PLT 205 06/28/2017      Chemistry      Component Value Date/Time   NA 139 06/28/2017 0951   NA 142 05/03/2017 1114   K 5.0 06/28/2017 0951   K 4.5 05/03/2017 1114   CL 108 06/28/2017 0951   CL 107 09/19/2012 1325   CO2 23 06/28/2017 0951   CO2 21 (L) 05/03/2017 1114   BUN 17 06/28/2017 0951   BUN 18.5 05/03/2017 1114   CREATININE 0.80 06/28/2017 0951   CREATININE 0.9 05/03/2017 1114      Component Value Date/Time   CALCIUM 8.9 06/28/2017 0951   CALCIUM 8.9 05/03/2017 1114   ALKPHOS 91 06/28/2017 0951   ALKPHOS 126 05/03/2017 1114   AST 12 06/28/2017 0951   AST 9 05/03/2017 1114   ALT <6 06/28/2017 0951   ALT <6 05/03/2017 1114   BILITOT 0.4 06/28/2017 0951   BILITOT 0.34 05/03/2017 1114       RADIOGRAPHIC STUDIES: I have reviewed multiple imaging studies with his wife I have personally reviewed the radiological images as listed and agreed with the findings in the report. Nm Pet Image Restag (ps) Skull Base To Thigh  Result Date: 06/26/2017 CLINICAL DATA:  Subsequent treatment strategy for non-small-cell lung cancer. EXAM: NUCLEAR MEDICINE PET SKULL BASE TO THIGH TECHNIQUE: 5.2 mCi F-18 FDG was injected intravenously. Full-ring PET imaging was performed from the skull base to thigh after the radiotracer. CT data was obtained and used for attenuation correction and anatomic localization. FASTING BLOOD GLUCOSE:  Value: 83 mg/dl COMPARISON:  None. FINDINGS: NECK: Diffuse and symmetric FDG accumulation identified in the parotid glands. No  hypermetabolic lymphadenopathy in the neck. CHEST: Left upper lobe nodule is stable in size measuring 2.1 x  1.9 cm today (image 21 series 8) compared to 2.1 x 2.0 cm previously. SUV max = 3.0 for this nodule today which compares to 3.4 previously. Right lower lobe index nodule (image 39 series 8) measured previously at 2.3 x 2.2 cm has decreased, measuring 1.3 x 1.6 cm. Qualitatively, this nodule is much less confluent on CT imaging than on the prior study. SUV max = 1.8 today which compares to 9.7 previously. The more caudal right lower lobe nodule measured previously at 3.1 x 2.3 cm has decreased in the interval measuring 2.1 x 1.6 cm. This lesion is become more cavitary since the prior study. SUV max = 1.9 today which compares to 7.4 previously. The previously described subtle bandlike opacity in the lingula with adjacent fiducial markers is stable. Marked changes of emphysema noted with upper lobe bullous disease, right greater than left and prominent right apical pleuroparenchymal scarring/architectural distortion. No hypermetabolic lymphadenopathy evident in the mediastinum or hilar regions on PET imaging. Right-sided Port-A-Cath tip is positioned in the mid right atrium. ABDOMEN/PELVIS: No abnormal hypermetabolic activity within the liver, pancreas, adrenal glands, or spleen. No hypermetabolic lymph nodes in the abdomen or pelvis. Similar appearance 2.3 cm cyst in the left kidney. There is abdominal aortic atherosclerosis without aneurysm. Bladder is markedly distended as before. Prostate gland is enlarged. SKELETON: The diffuse skeletal hypermetabolism seen on the previous study has decreased in the interval. No discrete or focal suspicious hypermetabolic bone lesion evident on today's study. IMPRESSION: 1. Clear interval response to therapy of the 2 right lower lobe pulmonary nodules, each decreased in both size and hypermetabolism. 2. Stable size and hypermetabolic activity in the left upper lobe nodule.  3. Diffusely decreased FDG accumulation within the marrow space. 4. Symmetric uptake in both parotid glands is likely physiologic. Given the symmetry, this is most likely physiologic or inflammatory. 5. Prostatomegaly with marked bladder distention. Component of bladder outlet obstruction suspected. 6.  Emphysema. (ICD10-J43.9) 7.  Aortic Atherosclerois (ICD10-170.0) Electronically Signed   By: Misty Stanley M.D.   On: 06/26/2017 12:32    All questions were answered. The patient knows to call the clinic with any problems, questions or concerns. No barriers to learning was detected.  I spent 30 minutes counseling the patient face to face. The total time spent in the appointment was 40 minutes and more than 50% was on counseling and review of test results  [name], MD 06/28/2017 11:55 AM

## 2017-06-28 NOTE — Telephone Encounter (Signed)
Gave patient AVs and calendar of upcoming February and march appointments.

## 2017-06-28 NOTE — Assessment & Plan Note (Signed)
We discussed some of the risks, benefits, and alternatives of blood transfusions. The patient is symptomatic from anemia and the hemoglobin level is critically low.  Some of the side-effects to be expected including risks of transfusion reactions, chills, infection, syndrome of volume overload and risk of hospitalization from various reasons and the patient is willing to proceed and went ahead to sign consent today. We will proceed with 1 unit of blood transfusion I will schedule darbepoetin injection and blood draws every 2 weeks and I plan to see him back in 2 months

## 2017-06-28 NOTE — Patient Instructions (Signed)

## 2017-06-28 NOTE — Assessment & Plan Note (Signed)
I have reviewed imaging study with the patient and wife in great detail The patient has near complete response to treatment He has fatigue, severe pancytopenia and has became transfusion dependent and suffered from poor memory I recommend chemotherapy holiday I plan to continue aggressive supportive care and repeat imaging study to reassess disease status in 3 months, due at the end of May

## 2017-06-29 LAB — BPAM RBC
Blood Product Expiration Date: 201903192359
ISSUE DATE / TIME: 201902201249
UNIT TYPE AND RH: 5100

## 2017-06-29 LAB — TYPE AND SCREEN
ABO/RH(D): O POS
Antibody Screen: NEGATIVE
DONOR AG TYPE: NEGATIVE
Unit division: 0

## 2017-07-05 ENCOUNTER — Inpatient Hospital Stay: Payer: Medicare Other

## 2017-07-05 VITALS — BP 123/77 | HR 99 | Temp 97.9°F | Resp 18

## 2017-07-05 DIAGNOSIS — D4620 Refractory anemia with excess of blasts, unspecified: Secondary | ICD-10-CM

## 2017-07-05 DIAGNOSIS — C3412 Malignant neoplasm of upper lobe, left bronchus or lung: Secondary | ICD-10-CM | POA: Diagnosis not present

## 2017-07-05 LAB — CBC WITH DIFFERENTIAL/PLATELET
Basophils Absolute: 0 10*3/uL (ref 0.0–0.1)
Basophils Relative: 0 %
Eosinophils Absolute: 0 10*3/uL (ref 0.0–0.5)
Eosinophils Relative: 0 %
HEMATOCRIT: 30.4 % — AB (ref 38.4–49.9)
Hemoglobin: 9.8 g/dL — ABNORMAL LOW (ref 13.0–17.1)
LYMPHS ABS: 0.5 10*3/uL — AB (ref 0.9–3.3)
LYMPHS PCT: 6 %
MCH: 30.9 pg (ref 27.2–33.4)
MCHC: 32.2 g/dL (ref 32.0–36.0)
MCV: 95.9 fL (ref 79.3–98.0)
MONO ABS: 0.6 10*3/uL (ref 0.1–0.9)
MONOS PCT: 7 %
NEUTROS ABS: 7.3 10*3/uL — AB (ref 1.5–6.5)
Neutrophils Relative %: 87 %
Platelets: 178 10*3/uL (ref 140–400)
RBC: 3.17 MIL/uL — ABNORMAL LOW (ref 4.20–5.82)
RDW: 20 % — AB (ref 11.0–14.6)
WBC: 8.5 10*3/uL (ref 4.0–10.3)

## 2017-07-05 LAB — SAMPLE TO BLOOD BANK

## 2017-07-05 MED ORDER — DARBEPOETIN ALFA 500 MCG/ML IJ SOSY
500.0000 ug | PREFILLED_SYRINGE | Freq: Once | INTRAMUSCULAR | Status: AC
  Administered 2017-07-05: 500 ug via SUBCUTANEOUS

## 2017-07-05 NOTE — Patient Instructions (Signed)

## 2017-07-19 ENCOUNTER — Inpatient Hospital Stay: Payer: Medicare Other

## 2017-07-19 ENCOUNTER — Other Ambulatory Visit: Payer: Self-pay | Admitting: Neurology

## 2017-07-19 ENCOUNTER — Inpatient Hospital Stay: Payer: Medicare Other | Attending: Hematology and Oncology

## 2017-07-19 VITALS — BP 134/75 | HR 87 | Temp 97.9°F | Resp 18

## 2017-07-19 DIAGNOSIS — Z923 Personal history of irradiation: Secondary | ICD-10-CM | POA: Insufficient documentation

## 2017-07-19 DIAGNOSIS — Z9221 Personal history of antineoplastic chemotherapy: Secondary | ICD-10-CM | POA: Insufficient documentation

## 2017-07-19 DIAGNOSIS — D61818 Other pancytopenia: Secondary | ICD-10-CM | POA: Insufficient documentation

## 2017-07-19 DIAGNOSIS — D4620 Refractory anemia with excess of blasts, unspecified: Secondary | ICD-10-CM | POA: Diagnosis not present

## 2017-07-19 DIAGNOSIS — C3412 Malignant neoplasm of upper lobe, left bronchus or lung: Secondary | ICD-10-CM | POA: Diagnosis present

## 2017-07-19 LAB — CBC WITH DIFFERENTIAL/PLATELET
BASOS ABS: 0 10*3/uL (ref 0.0–0.1)
BASOS PCT: 1 %
EOS PCT: 2 %
Eosinophils Absolute: 0.1 10*3/uL (ref 0.0–0.5)
HEMATOCRIT: 25.3 % — AB (ref 38.4–49.9)
Hemoglobin: 8.5 g/dL — ABNORMAL LOW (ref 13.0–17.1)
LYMPHS PCT: 17 %
Lymphs Abs: 0.7 10*3/uL — ABNORMAL LOW (ref 0.9–3.3)
MCH: 32.3 pg (ref 27.2–33.4)
MCHC: 33.4 g/dL (ref 32.0–36.0)
MCV: 96.6 fL (ref 79.3–98.0)
MONO ABS: 0.4 10*3/uL (ref 0.1–0.9)
MONOS PCT: 10 %
NEUTROS ABS: 2.8 10*3/uL (ref 1.5–6.5)
Neutrophils Relative %: 70 %
PLATELETS: 254 10*3/uL (ref 140–400)
RBC: 2.62 MIL/uL — ABNORMAL LOW (ref 4.20–5.82)
RDW: 19.3 % — ABNORMAL HIGH (ref 11.0–14.6)
WBC: 4 10*3/uL (ref 4.0–10.3)

## 2017-07-19 LAB — SAMPLE TO BLOOD BANK

## 2017-07-19 MED ORDER — DIPHENHYDRAMINE HCL 50 MG/ML IJ SOLN
INTRAMUSCULAR | Status: AC
  Filled 2017-07-19: qty 1

## 2017-07-19 MED ORDER — DARBEPOETIN ALFA 500 MCG/ML IJ SOSY
500.0000 ug | PREFILLED_SYRINGE | Freq: Once | INTRAMUSCULAR | Status: AC
  Administered 2017-07-19: 500 ug via SUBCUTANEOUS

## 2017-07-19 MED ORDER — PALONOSETRON HCL INJECTION 0.25 MG/5ML
INTRAVENOUS | Status: AC
  Filled 2017-07-19: qty 5

## 2017-07-19 NOTE — Patient Instructions (Signed)

## 2017-07-31 ENCOUNTER — Telehealth: Payer: Self-pay | Admitting: *Deleted

## 2017-07-31 ENCOUNTER — Telehealth: Payer: Self-pay | Admitting: Hematology and Oncology

## 2017-07-31 NOTE — Telephone Encounter (Signed)
Wife called requesting that patient's records be sent Attention: Dr Ace Gins at the [facility].  " [facility]"  Phone: [phone] Fax: [phone]

## 2017-07-31 NOTE — Telephone Encounter (Signed)
Faxed records to the [facility]

## 2017-08-02 ENCOUNTER — Telehealth: Payer: Self-pay

## 2017-08-02 ENCOUNTER — Inpatient Hospital Stay: Payer: Medicare Other

## 2017-08-02 ENCOUNTER — Other Ambulatory Visit: Payer: Self-pay

## 2017-08-02 VITALS — BP 137/85 | HR 98 | Temp 98.1°F | Resp 16

## 2017-08-02 DIAGNOSIS — D4620 Refractory anemia with excess of blasts, unspecified: Secondary | ICD-10-CM

## 2017-08-02 DIAGNOSIS — C3412 Malignant neoplasm of upper lobe, left bronchus or lung: Secondary | ICD-10-CM | POA: Diagnosis not present

## 2017-08-02 LAB — CBC WITH DIFFERENTIAL/PLATELET
BASOS ABS: 0 10*3/uL (ref 0.0–0.1)
BASOS PCT: 1 %
Eosinophils Absolute: 0.1 10*3/uL (ref 0.0–0.5)
Eosinophils Relative: 2 %
HEMATOCRIT: 24.3 % — AB (ref 38.4–49.9)
HEMOGLOBIN: 7.7 g/dL — AB (ref 13.0–17.1)
Lymphocytes Relative: 13 %
Lymphs Abs: 0.8 10*3/uL — ABNORMAL LOW (ref 0.9–3.3)
MCH: 32.6 pg (ref 27.2–33.4)
MCHC: 31.7 g/dL — ABNORMAL LOW (ref 32.0–36.0)
MCV: 103 fL — ABNORMAL HIGH (ref 79.3–98.0)
Monocytes Absolute: 0.5 10*3/uL (ref 0.1–0.9)
Monocytes Relative: 8 %
NEUTROS PCT: 76 %
Neutro Abs: 4.6 10*3/uL (ref 1.5–6.5)
Platelets: 219 10*3/uL (ref 140–400)
RBC: 2.36 MIL/uL — AB (ref 4.20–5.82)
RDW: UNDETERMINED % (ref 11.0–14.6)
WBC: 6 10*3/uL (ref 4.0–10.3)

## 2017-08-02 LAB — SAMPLE TO BLOOD BANK

## 2017-08-02 LAB — PREPARE RBC (CROSSMATCH)

## 2017-08-02 MED ORDER — SODIUM CHLORIDE 0.9 % IV SOLN
250.0000 mL | Freq: Once | INTRAVENOUS | Status: AC
  Administered 2017-08-02: 250 mL via INTRAVENOUS

## 2017-08-02 MED ORDER — DIPHENHYDRAMINE HCL 25 MG PO CAPS
ORAL_CAPSULE | ORAL | Status: AC
Start: 2017-08-02 — End: 2017-08-02
  Filled 2017-08-02: qty 1

## 2017-08-02 MED ORDER — DIPHENHYDRAMINE HCL 25 MG PO CAPS
25.0000 mg | ORAL_CAPSULE | Freq: Once | ORAL | Status: AC
  Administered 2017-08-02: 25 mg via ORAL

## 2017-08-02 MED ORDER — ACETAMINOPHEN 325 MG PO TABS
650.0000 mg | ORAL_TABLET | Freq: Once | ORAL | Status: AC
  Administered 2017-08-02: 650 mg via ORAL

## 2017-08-02 MED ORDER — DARBEPOETIN ALFA 500 MCG/ML IJ SOSY
PREFILLED_SYRINGE | INTRAMUSCULAR | Status: AC
  Filled 2017-08-02: qty 1

## 2017-08-02 MED ORDER — ACETAMINOPHEN 325 MG PO TABS
ORAL_TABLET | ORAL | Status: AC
  Filled 2017-08-02: qty 2

## 2017-08-02 MED ORDER — HEPARIN SOD (PORK) LOCK FLUSH 100 UNIT/ML IV SOLN
500.0000 [IU] | Freq: Every day | INTRAVENOUS | Status: AC | PRN
  Administered 2017-08-02: 500 [IU]
  Filled 2017-08-02: qty 5

## 2017-08-02 MED ORDER — DARBEPOETIN ALFA 500 MCG/ML IJ SOSY
500.0000 ug | PREFILLED_SYRINGE | Freq: Once | INTRAMUSCULAR | Status: AC
  Administered 2017-08-02: 500 ug via SUBCUTANEOUS

## 2017-08-02 MED ORDER — SODIUM CHLORIDE 0.9% FLUSH
10.0000 mL | INTRAVENOUS | Status: AC | PRN
  Administered 2017-08-02: 10 mL
  Filled 2017-08-02: qty 10

## 2017-08-02 NOTE — Patient Instructions (Signed)

## 2017-08-02 NOTE — Progress Notes (Signed)
RN visit for blood transfusion. Pt tolerated well w/o adverse effects. VSS Taken to lobby in w/c. Family member to pick him up to go home.

## 2017-08-02 NOTE — Telephone Encounter (Signed)
Received hemoglobin results of 7.7 today.  Notified Dr Alvy Bimler and received orders for I unit of PRBCs - orders placed.  Notified Beth in symptom management and was able to schedule adm of blood for 12 noon today- notified scheduling Ebony Hail. Made pt aware of this appt as well.

## 2017-08-03 LAB — BPAM RBC
Blood Product Expiration Date: 201904162359
ISSUE DATE / TIME: 201903271251
UNIT TYPE AND RH: 5100

## 2017-08-03 LAB — TYPE AND SCREEN
ABO/RH(D): O POS
ANTIBODY SCREEN: NEGATIVE
Donor AG Type: NEGATIVE
Unit division: 0

## 2017-08-16 ENCOUNTER — Telehealth: Payer: Self-pay | Admitting: Hematology and Oncology

## 2017-08-16 ENCOUNTER — Inpatient Hospital Stay: Payer: Medicare Other

## 2017-08-16 ENCOUNTER — Inpatient Hospital Stay (HOSPITAL_BASED_OUTPATIENT_CLINIC_OR_DEPARTMENT_OTHER): Payer: Medicare Other | Admitting: Hematology and Oncology

## 2017-08-16 ENCOUNTER — Inpatient Hospital Stay: Payer: Medicare Other | Attending: Hematology and Oncology

## 2017-08-16 VITALS — BP 104/71 | HR 82 | Temp 97.6°F | Resp 20 | Ht 72.0 in | Wt 103.7 lb

## 2017-08-16 DIAGNOSIS — D4620 Refractory anemia with excess of blasts, unspecified: Secondary | ICD-10-CM | POA: Insufficient documentation

## 2017-08-16 DIAGNOSIS — Z923 Personal history of irradiation: Secondary | ICD-10-CM | POA: Insufficient documentation

## 2017-08-16 DIAGNOSIS — C3412 Malignant neoplasm of upper lobe, left bronchus or lung: Secondary | ICD-10-CM | POA: Diagnosis not present

## 2017-08-16 DIAGNOSIS — Z8589 Personal history of malignant neoplasm of other organs and systems: Secondary | ICD-10-CM

## 2017-08-16 DIAGNOSIS — Z9221 Personal history of antineoplastic chemotherapy: Secondary | ICD-10-CM | POA: Insufficient documentation

## 2017-08-16 DIAGNOSIS — Z79899 Other long term (current) drug therapy: Secondary | ICD-10-CM

## 2017-08-16 DIAGNOSIS — D61818 Other pancytopenia: Secondary | ICD-10-CM

## 2017-08-16 DIAGNOSIS — J441 Chronic obstructive pulmonary disease with (acute) exacerbation: Secondary | ICD-10-CM | POA: Insufficient documentation

## 2017-08-16 DIAGNOSIS — D4621 Refractory anemia with excess of blasts 1: Secondary | ICD-10-CM

## 2017-08-16 DIAGNOSIS — J449 Chronic obstructive pulmonary disease, unspecified: Secondary | ICD-10-CM

## 2017-08-16 DIAGNOSIS — Z8521 Personal history of malignant neoplasm of larynx: Secondary | ICD-10-CM

## 2017-08-16 LAB — SAMPLE TO BLOOD BANK

## 2017-08-16 LAB — CBC WITH DIFFERENTIAL/PLATELET
BASOS ABS: 0 10*3/uL (ref 0.0–0.1)
Basophils Relative: 1 %
EOS ABS: 0 10*3/uL (ref 0.0–0.5)
EOS PCT: 1 %
HCT: 24.4 % — ABNORMAL LOW (ref 38.4–49.9)
Hemoglobin: 8.3 g/dL — ABNORMAL LOW (ref 13.0–17.1)
LYMPHS ABS: 0.5 10*3/uL — AB (ref 0.9–3.3)
Lymphocytes Relative: 9 %
MCH: 34.3 pg — AB (ref 27.2–33.4)
MCHC: 34 g/dL (ref 32.0–36.0)
MCV: 100.7 fL — AB (ref 79.3–98.0)
MONO ABS: 0.4 10*3/uL (ref 0.1–0.9)
Monocytes Relative: 7 %
Neutro Abs: 4.8 10*3/uL (ref 1.5–6.5)
Neutrophils Relative %: 82 %
PLATELETS: 248 10*3/uL (ref 140–400)
RBC: 2.42 MIL/uL — AB (ref 4.20–5.82)
RDW: 31.1 % — AB (ref 11.0–14.6)
WBC: 5.7 10*3/uL (ref 4.0–10.3)

## 2017-08-16 MED ORDER — SODIUM CHLORIDE 0.9% FLUSH
10.0000 mL | Freq: Once | INTRAVENOUS | Status: AC
  Administered 2017-08-16: 10 mL
  Filled 2017-08-16: qty 10

## 2017-08-16 MED ORDER — DARBEPOETIN ALFA 500 MCG/ML IJ SOSY
PREFILLED_SYRINGE | INTRAMUSCULAR | Status: AC
  Filled 2017-08-16: qty 1

## 2017-08-16 MED ORDER — DARBEPOETIN ALFA 500 MCG/ML IJ SOSY
500.0000 ug | PREFILLED_SYRINGE | Freq: Once | INTRAMUSCULAR | Status: AC
  Administered 2017-08-16: 500 ug via SUBCUTANEOUS

## 2017-08-16 MED ORDER — HEPARIN SOD (PORK) LOCK FLUSH 100 UNIT/ML IV SOLN
500.0000 [IU] | Freq: Once | INTRAVENOUS | Status: AC
  Administered 2017-08-16: 500 [IU]
  Filled 2017-08-16: qty 5

## 2017-08-16 NOTE — Telephone Encounter (Signed)
Gave patient AVs and calendar of upcoming April and may appointments,.

## 2017-08-17 ENCOUNTER — Encounter: Payer: Self-pay | Admitting: Hematology and Oncology

## 2017-08-17 NOTE — Assessment & Plan Note (Addendum)
He is not symptomatic from anemia He will get darbepoetin injection today The goal for darbepoetin injection is to keep hemoglobin greater than 10 He does not need blood transfusion We will continue blood transfusion as needed to keep hemoglobin greater than 8 g He needs 1 unit of blood whenever hemoglobin is less than 8

## 2017-08-17 NOTE — Assessment & Plan Note (Signed)
He tolerated chemotherapy poorly He is on a chemo break Plan to repeat PET/CT scan before I see him next month

## 2017-08-17 NOTE — Progress Notes (Signed)
[place] OFFICE PROGRESS NOTE  Patient Care Team: [name], MD as PCP - General (Family Medicine) [name], MD as Consulting Physician (Hematology and Oncology)  ASSESSMENT & PLAN:  Cancer of upper lobe of left lung [facility]) He tolerated chemotherapy poorly He is on a chemo break Plan to repeat PET/CT scan before I see him next month  History of head and neck cancer The patient is at high risk for cancer recurrence I recommend PET/CT scan as above  MDS (myelodysplastic syndrome), low grade ([place]) He is not symptomatic from anemia He will get darbepoetin injection today The goal for darbepoetin injection is to keep hemoglobin greater than 10 He does not need blood transfusion We will continue blood transfusion as needed to keep hemoglobin greater than 8 g He needs 1 unit of blood whenever hemoglobin is less than 8  COPD, mild ([place]) He continues to have chronic mild cough from COPD exacerbation He does not have signs or symptoms of infection He does not need antibiotic treatment   Orders Placed This Encounter  Procedures  . NM PET Image Restag (PS) Skull Base To Thigh    Standing Status:   Future    Standing Expiration Date:   08/18/2018    Order Specific Question:   If indicated for the ordered procedure, I authorize the administration of a radiopharmaceutical per Radiology protocol    Answer:   Yes    Order Specific Question:   Preferred imaging location?    Answer:   [facility]    Order Specific Question:   Radiology Contrast Protocol - do NOT remove file path    Answer:   [email or web address]    INTERVAL HISTORY: Please see below for problem oriented charting. Returns with his wife for further follow-up He denies recent smoking He continues to have chronic mild productive white sputum No recent fever or chills He continues to have poor appetite and appears cachectic The patient denies any recent signs or  symptoms of bleeding such as spontaneous epistaxis, hematuria or hematochezia.   SUMMARY OF ONCOLOGIC HISTORY: Oncology History   MDS, R-IPSS score of 3, low risk (Hg 8.9, +16 chromosome on BM, 2% blast count)   Squamous cell carcinoma of the epiglottis   Primary site: Larynx - Supraglottis (Left)   Staging method: AJCC 7th Edition   Clinical: Stage I (T1, N0, M0) signed by [name], MD on 04/30/2013 12:49 PM   Pathologic: Stage I (T1, N0, cM0) signed by [name], MD on 04/30/2013 12:49 PM   Summary: Stage I (T1, N0, cM0)       MDS (myelodysplastic syndrome), low grade ([place])   02/01/2007 Bone Marrow Biopsy    BM biopsy was abnormal, overall probable low grade MDS      03/23/2011 - 02/06/2013 Chemotherapy    [name], discontinued due to diagnosis of laryngeal ca      09/06/2013 Bone Marrow Biopsy    Repeat bone marrow aspirate and biopsy confirmed low-grade myelodysplastic syndrome.      09/17/2013 -  Chemotherapy    The patient resumed darbepoetin injections to treat the anemia.       History of head and neck cancer   03/06/2013 Procedure    Biopsy from epiglottic region showed invasive [facility]      04/25/2013 Imaging    Ct scan showed no other involvement in the LN      05/13/2013 - 06/28/2013 Radiation Therapy    He received radiation therapy,  70 gray in 35 fractions to the larynx      06/09/2016 Imaging    CT scan of the chest showed Bilateral spiculated soft tissue pulmonary masses, highly suspicious for multifocal malignancy. Borderline enlarged left-sided mediastinal lymphadenopathy. Background of severe emphysematous changes, chronic cylindrical bronchiectasis and hyperinflation of the lungs. Findings consistent with longstanding COPD. Diffusely thickened interstitial markings. This may represent chronic interstitial lung changes, however lymphangitic spread of malignancy cannot be excluded. Anterior compression deformities of several of the thoracic  vertebral bodies, with heterogeneous appearance of T7 and T9 vertebral body. This may represent degenerative changes, however osseous metastatic disease cannot be excluded.       07/11/2016 PET scan    There are 2 nodules in the left upper lobe and 2 nodules within the right lower lobe which have spiculated margin and exhibit intense radiotracer uptake compatible with multifocal pulmonary metastasis versus multiple synchronous primary pulmonary neoplasms. 2. Emphysema 3. Aortic atherosclerosis and multi vessel coronary artery calcification.       Cancer of upper lobe of left lung ([place])   06/09/2016 Imaging    CT chest: Bilateral spiculated soft tissue pulmonary masses, highly suspicious for multifocal malignancy. Borderline enlarged left-sided mediastinal lymphadenopathy. Background of severe emphysematous changes, chronic cylindrical bronchiectasis and hyperinflation of the lungs. Findings consistent with longstanding COPD. Diffusely thickened interstitial markings. This may represent chronic interstitial lung changes, however lymphangitic spread of malignancy cannot be excluded. Anterior compression deformities of several of the thoracic vertebral bodies, with heterogeneous appearance of T7 and T9 vertebral body. This may represent degenerative changes, however osseous metastatic disease cannot be excluded.       06/29/2016 Imaging    MRI brain: No evidence of metastatic disease or recent infarction. Advanced generalized brain atrophy with moderate to marked chronic small-vessel ischemic changes throughout. FLAIR imaging appears to show scattered gyri showing FLAIR hyperintensity. These abnormalities are not confirmed with restricted diffusion or contrast enhancement. Therefore, we are not certain that this is not artifactual. The differential diagnosis does include true pathology such as posterior reversible encephalopathy, viral or prion disease, postictal change in post chemotherapy change. If  there is concern about active CNS pathology, re- scanning in 4-6 weeks could be useful.      07/27/2016 Imaging    CT chest: Bilateral pulmonary lesions. These are suspicious for metastatic disease. Lesions have minimally changed but there may be slight enlargement of the cavitary lesion in the right lower lobe. Severe emphysematous changes.       08/10/2016 Pathology Results    1. Lung, biopsy, RUL, (apical segment) - SCANT BENIGN LUNG PARENCHYMA. - THERE IS NO EVIDENCE OF MALIGNANCY. 2. Lung, biopsy, LUL lingula - SQUAMOUS CELL CARCINOMA. - SEE COMMENT. 3. Lung, biopsy, LUL - ADENOCARCINOMA. - SEE COMMENT. Microscopic Comment 2. The malignant cells are positive for cytokeratin 56 and p63. They are negative for TTF-1. The findings are consistent with squamous cell carcinoma. There is likely insufficient tissue remaining for additional studies, if requested. 3. The malignant cells are positive for TTF-1 and negative for p63 and cytokeratin 5/6. The findings are consistent with adenocarcinoma.      08/10/2016 Procedure    The targets were defined as follows: Left upper lobe nodule, target 1 Lingular nodule, target 2 Proximal right lower lobe nodule, target 3 More distal right lower lobe, nodule  The extendable working channel was secured into place and the locator guide was withdrawn. Under fluoroscopic guidance transbronchial needle brushings, transbronchial Wang needle biopsies, and transbronchial forceps biopsies  were performed in the LUL (target 1) and the lingula (target 2) to be sent for cytology and pathology. A bronchioalveolar lavage was performed in the lingula in the vicinity of target 2  and sent for cytology and microbiology (bacterial, fungal, AFB smears and cultures).  Fiducial markers were then placed around target 1, target 2, and in the right lower lobe in the vicinity of both target 3 and target 4 to be used for possible radiation therapy should this be indicated. At the  end of the procedure a general airway inspection was performed and there was no evidence of active bleeding. The bronchoscope was removed.  The patient tolerated the procedure well. There was no significant blood loss and there were no obvious complications. A post-procedural chest x-ray is pending.  Samples: 1. Transbronchial needle brushings from targets 1 and 2 2. Transbronchial Wang needle biopsies from targets 1 and 2 3. Transbronchial forceps biopsies from targets 1 and 2 4. Bronchoalveolar lavage from lingula, target 2 5. Endobronchial biopsies from RUL apical segment 6. Endobronchial brushings from right upper lobe apical segment       08/10/2016 Genetic Testing    Patient has genetic testing done for [employer] and PD-L1 testing Results revealed PD-L1 testing is negative 0%      08/31/2016 Procedure    Technically successful right IJ power-injectable port catheter placement. Ready for routine use      09/21/2016 - 06/08/2017 Chemotherapy    The patient had chemotherapy with carboplatin and Taxol.      11/30/2016 PET scan    1. Interval resection of the hypermetabolic lingular nodule. 2. The 3 other spiculated, hypermetabolic nodules within the left upper and right lower lobes are slightly smaller with decreased metabolic activity, consistent with some response to interval therapy. 3. No progressive disease      03/06/2017 PET scan    1. Mixed appearance, with significant improvement in the left upper lobe nodule ; stable appearance of the more cephalad right lower lobe nodule; but enlargement and increased in metabolic activity of the more inferior right lower lobe nodule which appears cavitary. No new nodule. 2. Previously there was some moderate increase in diffuse marrow activity. That has increased further, and could be a response from prior granulocyte/marrow stimulation, although I cannot completely exclude the possibility that this is related to the patient's  myelodysplastic syndrome. No CT correlate. 3. Other imaging findings of potential clinical significance: Aortic Atherosclerosis (ICD10-I70.0) and Emphysema (ICD10-J43.9). Coronary atherosclerosis. Low-density blood pool suggests anemia. Chronic right upper lobe scarring. Mucus in both mainstem bronchi. Suspected left renal cysts. Indistinct but likely enlarged prostate gland.      04/06/2017 - 04/18/2017 Radiation Therapy    He received SBRT treatment Radiation treatment dates:   04/06/17 - 04/18/17  Site/dose:   Right SBRT Lung// 50 Gy in 5 fx  Beams/energy:   Photon // SRBT/ SBT-3D       06/26/2017 PET scan    1. Clear interval response to therapy of the 2 right lower lobe pulmonary nodules, each decreased in both size and hypermetabolism. 2. Stable size and hypermetabolic activity in the left upper lobe nodule. 3. Diffusely decreased FDG accumulation within the marrow space. 4. Symmetric uptake in both parotid glands is likely physiologic. Given the symmetry, this is most likely physiologic or inflammatory. 5. Prostatomegaly with marked bladder distention. Component of bladder outlet obstruction suspected. 6. Emphysema. (ICD10-J43.9) 7. Aortic Atherosclerois (ICD10-170.0)       REVIEW OF SYSTEMS:  Constitutional: Denies fevers, chills or abnormal weight loss Eyes: Denies blurriness of vision Ears, nose, mouth, throat, and face: Denies mucositis or sore throat Respiratory: Denies cough, dyspnea or wheezes Cardiovascular: Denies palpitation, chest discomfort or lower extremity swelling Gastrointestinal:  Denies nausea, heartburn or change in bowel habits Skin: Denies abnormal skin rashes Lymphatics: Denies new lymphadenopathy or easy bruising Neurological:Denies numbness, tingling or new weaknesses Behavioral/Psych: Mood is stable, no new changes  All other systems were reviewed with the patient and are negative.  I have reviewed the past medical history, past surgical  history, social history and family history with the patient and they are unchanged from previous note.  ALLERGIES:  is allergic to no known allergies.  MEDICATIONS:  Current Outpatient Medications  Medication Sig Dispense Refill  . albuterol (PROVENTIL HFA;VENTOLIN HFA) 108 (90 Base) MCG/ACT inhaler Inhale 2 puffs into the lungs every 4 (four) hours as needed for wheezing or shortness of breath. 1 Inhaler 5  . cyanocobalamin 500 MCG tablet Take 500 mcg by mouth daily.    . ferrous sulfate 325 (65 FE) MG tablet Take 325 mg by mouth daily with breakfast.    . folic acid (FOLVITE) 1 MG tablet Take 1 mg by mouth daily.    . Glycopyrrolate-Formoterol (BEVESPI AEROSPHERE) 9-4.8 MCG/ACT AERO Inhale 2 puffs into the lungs 2 (two) times daily. 2 Inhaler 0  . levETIRAcetam (KEPPRA) 500 MG tablet TAKE 1 TABLET BY MOUTH TWICE A DAY 180 tablet 3  . vitamin E 1000 UNIT capsule Take 1,000 Units by mouth daily.      No current facility-administered medications for this visit.     PHYSICAL EXAMINATION: ECOG PERFORMANCE STATUS: 1 - Symptomatic but completely ambulatory  Vitals:   08/16/17 1028  BP: 104/71  Pulse: 82  Resp: 20  Temp: 97.6 F (36.4 C)  SpO2: 100%   Filed Weights   08/16/17 1028  Weight: 103 lb 11.2 oz (47 kg)    GENERAL:alert, no distress and comfortable.  He looks thin and cachectic SKIN: skin color, texture, turgor are normal, no rashes or significant lesions EYES: normal, Conjunctiva are pink and non-injected, sclera clear OROPHARYNX:no exudate, no erythema and lips, buccal mucosa, and tongue normal  NECK: supple, thyroid normal size, non-tender, without nodularity LYMPH:  no palpable lymphadenopathy in the cervical, axillary or inguinal LUNGS: clear to auscultation and percussion with normal breathing effort HEART: regular rate & rhythm and no murmurs and no lower extremity edema ABDOMEN:abdomen soft, non-tender and normal bowel sounds Musculoskeletal:no cyanosis of  digits with chronic digital clubbing NEURO: alert & oriented x 3 with fluent speech, no focal motor/sensory deficits  LABORATORY DATA:  I have reviewed the data as listed    Component Value Date/Time   NA 139 06/28/2017 0951   NA 142 05/03/2017 1114   K 5.0 06/28/2017 0951   K 4.5 05/03/2017 1114   CL 108 06/28/2017 0951   CL 107 09/19/2012 1325   CO2 23 06/28/2017 0951   CO2 21 (L) 05/03/2017 1114   GLUCOSE 79 06/28/2017 0951   GLUCOSE 88 05/03/2017 1114   GLUCOSE 97 09/19/2012 1325   BUN 17 06/28/2017 0951   BUN 18.5 05/03/2017 1114   CREATININE 0.80 06/28/2017 0951   CREATININE 0.9 05/03/2017 1114   CALCIUM 8.9 06/28/2017 0951   CALCIUM 8.9 05/03/2017 1114   PROT 8.2 06/28/2017 0951   PROT 8.6 (H) 05/03/2017 1114   ALBUMIN 2.9 (L) 06/28/2017 0951   ALBUMIN 3.2 (L) 05/03/2017 1114  AST 12 06/28/2017 0951   AST 9 05/03/2017 1114   ALT <6 06/28/2017 0951   ALT <6 05/03/2017 1114   ALKPHOS 91 06/28/2017 0951   ALKPHOS 126 05/03/2017 1114   BILITOT 0.4 06/28/2017 0951   BILITOT 0.34 05/03/2017 1114   GFRNONAA >60 06/28/2017 0951   GFRAA >60 06/28/2017 0951    No results found for: SPEP, UPEP  Lab Results  Component Value Date   WBC 5.7 08/16/2017   NEUTROABS 4.8 08/16/2017   HGB 8.3 (L) 08/16/2017   HCT 24.4 (L) 08/16/2017   MCV 100.7 (H) 08/16/2017   PLT 248 08/16/2017      Chemistry      Component Value Date/Time   NA 139 06/28/2017 0951   NA 142 05/03/2017 1114   K 5.0 06/28/2017 0951   K 4.5 05/03/2017 1114   CL 108 06/28/2017 0951   CL 107 09/19/2012 1325   CO2 23 06/28/2017 0951   CO2 21 (L) 05/03/2017 1114   BUN 17 06/28/2017 0951   BUN 18.5 05/03/2017 1114   CREATININE 0.80 06/28/2017 0951   CREATININE 0.9 05/03/2017 1114      Component Value Date/Time   CALCIUM 8.9 06/28/2017 0951   CALCIUM 8.9 05/03/2017 1114   ALKPHOS 91 06/28/2017 0951   ALKPHOS 126 05/03/2017 1114   AST 12 06/28/2017 0951   AST 9 05/03/2017 1114   ALT <6  06/28/2017 0951   ALT <6 05/03/2017 1114   BILITOT 0.4 06/28/2017 0951   BILITOT 0.34 05/03/2017 1114      All questions were answered. The patient knows to call the clinic with any problems, questions or concerns. No barriers to learning was detected.  I spent 20 minutes counseling the patient face to face. The total time spent in the appointment was 25 minutes and more than 50% was on counseling and review of test results  [name], MD 08/17/2017 10:08 AM

## 2017-08-17 NOTE — Assessment & Plan Note (Signed)
The patient is at high risk for cancer recurrence I recommend PET/CT scan as above

## 2017-08-17 NOTE — Assessment & Plan Note (Signed)
He continues to have chronic mild cough from COPD exacerbation He does not have signs or symptoms of infection He does not need antibiotic treatment

## 2017-08-30 ENCOUNTER — Inpatient Hospital Stay: Payer: Medicare Other

## 2017-08-30 ENCOUNTER — Other Ambulatory Visit: Payer: Self-pay

## 2017-08-30 VITALS — BP 103/66 | HR 70 | Temp 97.9°F | Resp 18

## 2017-08-30 DIAGNOSIS — T451X5A Adverse effect of antineoplastic and immunosuppressive drugs, initial encounter: Secondary | ICD-10-CM

## 2017-08-30 DIAGNOSIS — D4620 Refractory anemia with excess of blasts, unspecified: Secondary | ICD-10-CM

## 2017-08-30 DIAGNOSIS — C3412 Malignant neoplasm of upper lobe, left bronchus or lung: Secondary | ICD-10-CM | POA: Diagnosis not present

## 2017-08-30 DIAGNOSIS — D6481 Anemia due to antineoplastic chemotherapy: Secondary | ICD-10-CM

## 2017-08-30 DIAGNOSIS — Z95828 Presence of other vascular implants and grafts: Secondary | ICD-10-CM

## 2017-08-30 DIAGNOSIS — D61818 Other pancytopenia: Secondary | ICD-10-CM

## 2017-08-30 LAB — CBC WITH DIFFERENTIAL/PLATELET
BASOS ABS: 0 10*3/uL (ref 0.0–0.1)
Basophils Relative: 1 %
Eosinophils Absolute: 0 10*3/uL (ref 0.0–0.5)
Eosinophils Relative: 1 %
HCT: 22.6 % — ABNORMAL LOW (ref 38.4–49.9)
Hemoglobin: 7.6 g/dL — ABNORMAL LOW (ref 13.0–17.1)
LYMPHS ABS: 0.6 10*3/uL — AB (ref 0.9–3.3)
LYMPHS PCT: 12 %
MCH: 35.1 pg — AB (ref 27.2–33.4)
MCHC: 33.5 g/dL (ref 32.0–36.0)
MCV: 104.7 fL — AB (ref 79.3–98.0)
Monocytes Absolute: 0.4 10*3/uL (ref 0.1–0.9)
Monocytes Relative: 9 %
NEUTROS ABS: 3.8 10*3/uL (ref 1.5–6.5)
NEUTROS PCT: 77 %
Platelets: 230 10*3/uL (ref 140–400)
RBC: 2.16 MIL/uL — AB (ref 4.20–5.82)
RDW: 31.5 % — ABNORMAL HIGH (ref 11.0–14.6)
WBC: 4.8 10*3/uL (ref 4.0–10.3)

## 2017-08-30 LAB — SAMPLE TO BLOOD BANK

## 2017-08-30 LAB — PREPARE RBC (CROSSMATCH)

## 2017-08-30 MED ORDER — DARBEPOETIN ALFA 500 MCG/ML IJ SOSY
500.0000 ug | PREFILLED_SYRINGE | Freq: Once | INTRAMUSCULAR | Status: AC
  Administered 2017-08-30: 500 ug via SUBCUTANEOUS

## 2017-08-30 MED ORDER — SODIUM CHLORIDE 0.9 % IV SOLN
Freq: Once | INTRAVENOUS | Status: AC
  Administered 2017-08-30: 14:00:00 via INTRAVENOUS

## 2017-08-30 MED ORDER — SODIUM CHLORIDE 0.9% FLUSH
10.0000 mL | Freq: Once | INTRAVENOUS | Status: AC
  Administered 2017-08-30: 10 mL
  Filled 2017-08-30: qty 10

## 2017-08-30 MED ORDER — ACETAMINOPHEN 325 MG PO TABS
ORAL_TABLET | ORAL | Status: AC
  Filled 2017-08-30: qty 2

## 2017-08-30 MED ORDER — DARBEPOETIN ALFA 500 MCG/ML IJ SOSY
PREFILLED_SYRINGE | INTRAMUSCULAR | Status: AC
  Filled 2017-08-30: qty 1

## 2017-08-30 MED ORDER — HEPARIN SOD (PORK) LOCK FLUSH 100 UNIT/ML IV SOLN
500.0000 [IU] | Freq: Once | INTRAVENOUS | Status: AC
  Administered 2017-08-30: 500 [IU] via INTRAVENOUS
  Filled 2017-08-30: qty 5

## 2017-08-30 MED ORDER — SODIUM CHLORIDE 0.9% FLUSH
10.0000 mL | INTRAVENOUS | Status: DC | PRN
  Administered 2017-08-30: 10 mL via INTRAVENOUS
  Filled 2017-08-30: qty 10

## 2017-08-30 MED ORDER — DIPHENHYDRAMINE HCL 25 MG PO CAPS
ORAL_CAPSULE | ORAL | Status: AC
Start: 2017-08-30 — End: 2017-08-30
  Filled 2017-08-30: qty 1

## 2017-08-30 MED ORDER — DIPHENHYDRAMINE HCL 25 MG PO CAPS
25.0000 mg | ORAL_CAPSULE | Freq: Once | ORAL | Status: AC
  Administered 2017-08-30: 25 mg via ORAL

## 2017-08-30 MED ORDER — ACETAMINOPHEN 325 MG PO TABS
650.0000 mg | ORAL_TABLET | Freq: Once | ORAL | Status: AC
  Administered 2017-08-30: 650 mg via ORAL

## 2017-08-30 NOTE — Progress Notes (Signed)
Per Dr.Gorsuch note, transfuse 1 unit for Hg less than 8. Pt hg today 7.6. Placed blood orders and notified infusion and blood bank.

## 2017-08-30 NOTE — Patient Instructions (Signed)

## 2017-08-31 LAB — TYPE AND SCREEN
ABO/RH(D): O POS
ANTIBODY SCREEN: NEGATIVE
Donor AG Type: NEGATIVE
UNIT DIVISION: 0

## 2017-08-31 LAB — BPAM RBC
BLOOD PRODUCT EXPIRATION DATE: 201905082359
ISSUE DATE / TIME: 201904241439
UNIT TYPE AND RH: 5100

## 2017-09-13 ENCOUNTER — Telehealth: Payer: Self-pay

## 2017-09-13 ENCOUNTER — Inpatient Hospital Stay: Payer: Medicare Other

## 2017-09-13 ENCOUNTER — Inpatient Hospital Stay: Payer: Medicare Other | Attending: Hematology and Oncology

## 2017-09-13 DIAGNOSIS — D469 Myelodysplastic syndrome, unspecified: Secondary | ICD-10-CM | POA: Diagnosis not present

## 2017-09-13 DIAGNOSIS — D4620 Refractory anemia with excess of blasts, unspecified: Secondary | ICD-10-CM

## 2017-09-13 DIAGNOSIS — R64 Cachexia: Secondary | ICD-10-CM | POA: Insufficient documentation

## 2017-09-13 DIAGNOSIS — Z981 Arthrodesis status: Secondary | ICD-10-CM | POA: Insufficient documentation

## 2017-09-13 DIAGNOSIS — Z9221 Personal history of antineoplastic chemotherapy: Secondary | ICD-10-CM | POA: Diagnosis not present

## 2017-09-13 DIAGNOSIS — R918 Other nonspecific abnormal finding of lung field: Secondary | ICD-10-CM | POA: Diagnosis not present

## 2017-09-13 DIAGNOSIS — D61818 Other pancytopenia: Secondary | ICD-10-CM

## 2017-09-13 DIAGNOSIS — Z79899 Other long term (current) drug therapy: Secondary | ICD-10-CM | POA: Insufficient documentation

## 2017-09-13 DIAGNOSIS — C3412 Malignant neoplasm of upper lobe, left bronchus or lung: Secondary | ICD-10-CM | POA: Diagnosis not present

## 2017-09-13 LAB — CBC WITH DIFFERENTIAL/PLATELET
Basophils Absolute: 0 10*3/uL (ref 0.0–0.1)
Basophils Relative: 1 %
EOS ABS: 0 10*3/uL (ref 0.0–0.5)
Eosinophils Relative: 1 %
HCT: 26.1 % — ABNORMAL LOW (ref 38.4–49.9)
Hemoglobin: 8.7 g/dL — ABNORMAL LOW (ref 13.0–17.1)
LYMPHS ABS: 0.4 10*3/uL — AB (ref 0.9–3.3)
LYMPHS PCT: 9 %
MCH: 35.2 pg — AB (ref 27.2–33.4)
MCHC: 33.2 g/dL (ref 32.0–36.0)
MCV: 106.1 fL — ABNORMAL HIGH (ref 79.3–98.0)
MONOS PCT: 7 %
Monocytes Absolute: 0.3 10*3/uL (ref 0.1–0.9)
Neutro Abs: 3.6 10*3/uL (ref 1.5–6.5)
Neutrophils Relative %: 82 %
PLATELETS: 223 10*3/uL (ref 140–400)
RBC: 2.46 MIL/uL — ABNORMAL LOW (ref 4.20–5.82)
RDW: 30.1 % — ABNORMAL HIGH (ref 11.0–14.6)
WBC: 4.4 10*3/uL (ref 4.0–10.3)

## 2017-09-13 LAB — SAMPLE TO BLOOD BANK

## 2017-09-13 MED ORDER — DARBEPOETIN ALFA 500 MCG/ML IJ SOSY
PREFILLED_SYRINGE | INTRAMUSCULAR | Status: AC
  Filled 2017-09-13: qty 1

## 2017-09-13 MED ORDER — SODIUM CHLORIDE 0.9% FLUSH
10.0000 mL | Freq: Once | INTRAVENOUS | Status: AC
  Administered 2017-09-13: 10 mL
  Filled 2017-09-13: qty 10

## 2017-09-13 MED ORDER — DARBEPOETIN ALFA 500 MCG/ML IJ SOSY
500.0000 ug | PREFILLED_SYRINGE | Freq: Once | INTRAMUSCULAR | Status: AC
  Administered 2017-09-13: 500 ug via SUBCUTANEOUS

## 2017-09-13 NOTE — Telephone Encounter (Signed)
Received call from lab regarding hemoglobin is 8.7 today.  Then call from [facility] infusion confirming that no blood today per Dr Alvy Bimler 08/16/2017 notes.  Dr Alvy Bimler not in office.  Reviewed her notes "He needs 1 unit of blood whenever hemoglobin is less than 8" with Dr Lindi Adie and made him aware of hemoglobin today is 8.7 and he agrees with her note that pt will not receive blood today.  Notified Psychiatric nurse in infusion.

## 2017-09-13 NOTE — Progress Notes (Signed)
r MD progress note 4/10. Hgb below 8 give 1 unit RBC.Hgb today 8.7. No transfusion needed today. Aranesp released.

## 2017-09-14 ENCOUNTER — Encounter: Payer: Self-pay | Admitting: Internal Medicine

## 2017-09-14 NOTE — Progress Notes (Signed)
Patient's spouse called regarding assistance with bills.  Reviewed billing and he has a balance pending insurance but does not have a balance in self-pay.  Advised her that with two insurances the claims will be sent to both plans for payment and will only receive a bill if one does not pay on something. She states she thought she received a bill but will call me back if she finds it. She has my contact name and number for any additional financial questions or concerns.

## 2017-09-25 ENCOUNTER — Encounter (HOSPITAL_COMMUNITY)
Admission: RE | Admit: 2017-09-25 | Discharge: 2017-09-25 | Disposition: A | Discharge status: Home / Self Care | Payer: Medicare Other | Source: Ambulatory Visit | Attending: Hematology and Oncology | Admitting: Hematology and Oncology

## 2017-09-25 DIAGNOSIS — C3412 Malignant neoplasm of upper lobe, left bronchus or lung: Secondary | ICD-10-CM

## 2017-09-25 DIAGNOSIS — Z8589 Personal history of malignant neoplasm of other organs and systems: Secondary | ICD-10-CM | POA: Diagnosis present

## 2017-09-25 DIAGNOSIS — C3490 Malignant neoplasm of unspecified part of unspecified bronchus or lung: Secondary | ICD-10-CM | POA: Diagnosis not present

## 2017-09-25 LAB — GLUCOSE, CAPILLARY: GLUCOSE-CAPILLARY: 92 mg/dL (ref 65–99)

## 2017-09-25 MED ORDER — FLUDEOXYGLUCOSE F - 18 (FDG) INJECTION
5.1000 | Freq: Once | INTRAVENOUS | Status: AC | PRN
  Administered 2017-09-25: 5.1 via INTRAVENOUS

## 2017-09-27 ENCOUNTER — Inpatient Hospital Stay: Payer: Medicare Other

## 2017-09-27 ENCOUNTER — Telehealth: Payer: Self-pay | Admitting: Hematology and Oncology

## 2017-09-27 ENCOUNTER — Inpatient Hospital Stay (HOSPITAL_BASED_OUTPATIENT_CLINIC_OR_DEPARTMENT_OTHER): Payer: Medicare Other | Admitting: Hematology and Oncology

## 2017-09-27 ENCOUNTER — Encounter: Payer: Self-pay | Admitting: Hematology and Oncology

## 2017-09-27 VITALS — BP 127/70 | HR 93 | Temp 97.6°F | Resp 18 | Ht 72.0 in | Wt 101.8 lb

## 2017-09-27 DIAGNOSIS — C3412 Malignant neoplasm of upper lobe, left bronchus or lung: Secondary | ICD-10-CM

## 2017-09-27 DIAGNOSIS — D4621 Refractory anemia with excess of blasts 1: Secondary | ICD-10-CM

## 2017-09-27 DIAGNOSIS — Z923 Personal history of irradiation: Secondary | ICD-10-CM

## 2017-09-27 DIAGNOSIS — R64 Cachexia: Secondary | ICD-10-CM

## 2017-09-27 DIAGNOSIS — D4620 Refractory anemia with excess of blasts, unspecified: Secondary | ICD-10-CM

## 2017-09-27 DIAGNOSIS — D46Z Other myelodysplastic syndromes: Secondary | ICD-10-CM

## 2017-09-27 DIAGNOSIS — D61818 Other pancytopenia: Secondary | ICD-10-CM

## 2017-09-27 DIAGNOSIS — Z7189 Other specified counseling: Secondary | ICD-10-CM

## 2017-09-27 DIAGNOSIS — Z79899 Other long term (current) drug therapy: Secondary | ICD-10-CM

## 2017-09-27 DIAGNOSIS — J449 Chronic obstructive pulmonary disease, unspecified: Secondary | ICD-10-CM | POA: Diagnosis not present

## 2017-09-27 DIAGNOSIS — Z8521 Personal history of malignant neoplasm of larynx: Secondary | ICD-10-CM | POA: Diagnosis not present

## 2017-09-27 DIAGNOSIS — Z9221 Personal history of antineoplastic chemotherapy: Secondary | ICD-10-CM | POA: Diagnosis not present

## 2017-09-27 LAB — CBC WITH DIFFERENTIAL/PLATELET
BASOS ABS: 0 10*3/uL (ref 0.0–0.1)
Basophils Relative: 1 %
Eosinophils Absolute: 0 10*3/uL (ref 0.0–0.5)
Eosinophils Relative: 1 %
HCT: 24.4 % — ABNORMAL LOW (ref 38.4–49.9)
HEMOGLOBIN: 8.2 g/dL — AB (ref 13.0–17.1)
LYMPHS ABS: 0.5 10*3/uL — AB (ref 0.9–3.3)
LYMPHS PCT: 9 %
MCH: 36.9 pg — AB (ref 27.2–33.4)
MCHC: 33.6 g/dL (ref 32.0–36.0)
MCV: 109.7 fL — AB (ref 79.3–98.0)
Monocytes Absolute: 0.4 10*3/uL (ref 0.1–0.9)
Monocytes Relative: 8 %
NEUTROS PCT: 81 %
Neutro Abs: 4.2 10*3/uL (ref 1.5–6.5)
PLATELETS: 259 10*3/uL (ref 140–400)
RBC: 2.22 MIL/uL — AB (ref 4.20–5.82)
RDW: 30.2 % — ABNORMAL HIGH (ref 11.0–14.6)
WBC: 5.2 10*3/uL (ref 4.0–10.3)

## 2017-09-27 LAB — SAMPLE TO BLOOD BANK

## 2017-09-27 MED ORDER — DARBEPOETIN ALFA 500 MCG/ML IJ SOSY
500.0000 ug | PREFILLED_SYRINGE | Freq: Once | INTRAMUSCULAR | Status: AC
  Administered 2017-09-27: 500 ug via SUBCUTANEOUS

## 2017-09-27 MED ORDER — DARBEPOETIN ALFA 500 MCG/ML IJ SOSY
PREFILLED_SYRINGE | INTRAMUSCULAR | Status: AC
  Filled 2017-09-27: qty 1

## 2017-09-27 MED ORDER — SODIUM CHLORIDE 0.9% FLUSH
10.0000 mL | Freq: Once | INTRAVENOUS | Status: AC
  Administered 2017-09-27: 10 mL
  Filled 2017-09-27: qty 10

## 2017-09-27 MED ORDER — HEPARIN SOD (PORK) LOCK FLUSH 100 UNIT/ML IV SOLN
500.0000 [IU] | Freq: Once | INTRAVENOUS | Status: AC
  Administered 2017-09-27: 500 [IU]
  Filled 2017-09-27: qty 5

## 2017-09-27 NOTE — Assessment & Plan Note (Signed)
We had extensive goals of care discussion in the past The patient has some baseline memory impairment His wife is the dedicated healthcare power of attorney She would like him to proceed with treatment We discussed goals of treatment is strictly palliative

## 2017-09-27 NOTE — Assessment & Plan Note (Signed)
The patient is profoundly cachectic Previous attempt to put him on low-dose steroids as appetite stimulant was not helpful I suspect this is due to disease He will continue frequent small meals as tolerated

## 2017-09-27 NOTE — Assessment & Plan Note (Signed)
He is not symptomatic from anemia He will get darbepoetin injection today The goal for darbepoetin injection is to keep hemoglobin greater than 10 He does not need blood transfusion today We will continue blood transfusion as needed to keep hemoglobin greater than 8 g He needs 1 unit of blood whenever hemoglobin is less than 8

## 2017-09-27 NOTE — Progress Notes (Signed)
DISCONTINUE OFF PATHWAY REGIMEN - Non-Small Cell Lung   [id number]:Carboplatin AUC=2 + Paclitaxel 45 mg/m2 Weekly:   Administer weekly:     Paclitaxel      Carboplatin   **Always confirm dose/schedule in your pharmacy ordering system**    REASON: Disease Progression PRIOR TREATMENT: Off Pathway: Carboplatin AUC=2 + Paclitaxel 45 mg/m2 Weekly TREATMENT RESPONSE: Progressive Disease (PD)  START ON PATHWAY REGIMEN - Non-Small Cell Lung     A cycle is every 21 days:     Atezolizumab   **Always confirm dose/schedule in your pharmacy ordering system**    Patient Characteristics: Stage IV Metastatic, Nonsquamous, Second Line - Chemotherapy/Immunotherapy, PS = 2, No Prior PD-1/PD-L1  Inhibitor and Immunotherapy Candidate AJCC T Category: T4 Current Disease Status: Distant Metastases AJCC N Category: N1 AJCC M Category: M1 AJCC 8 Stage Grouping: IV Histology: Nonsquamous Cell ROS1 Rearrangement Status: Negative T790M Mutation Status: Not Applicable - EGFR Mutation Negative/Unknown Other Mutations/Biomarkers: No Other Actionable Mutations NTRK Gene Fusion Status: Quantity Not Sufficient PD-L1 Expression Status: Quantity Not Sufficient Chemotherapy/Immunotherapy LOT: Second Line Chemotherapy/Immunotherapy Molecular Targeted Therapy: Not Appropriate ALK Translocation Status: Negative EGFR Mutation Status: Non-Sensitizing BRAF V600E Mutation Status: Negative Performance Status: PS = 2 Immunotherapy Candidate Status: Candidate for Immunotherapy Prior Immunotherapy Status: No Prior PD-1/PD-L1 Inhibitor Intent of Therapy: Non-Curative / Palliative Intent, Discussed with Patient

## 2017-09-27 NOTE — Progress Notes (Signed)
[facility] OFFICE PROGRESS NOTE  Patient Care Team: [name], MD as PCP - General (Family Medicine) [name], MD as Consulting Physician (Hematology and Oncology)  ASSESSMENT & PLAN:  Cancer of upper lobe of left lung [facility]) PET CT scan is reviewed with the patient's and his wife It showed evidence of disease progression He tolerated prior chemotherapy poorly Previous biopsy showed insufficient tissue cell for additional studies Given his frail status, I recommend single agent immunotherapy only The risk, benefits, side effects of treatment including risk of pneumonitis, allergic reaction, hypothyroidism and others were discussed and he agreed to proceed I will schedule treatment to start next week I recommend minimum 3 months of treatment before repeat imaging study The patient and his wife is aware that the goals of treatment is palliative only  Malignant cachexia ([place]) The patient is profoundly cachectic Previous attempt to put him on low-dose steroids as appetite stimulant was not helpful I suspect this is due to disease He will continue frequent small meals as tolerated  MDS (myelodysplastic syndrome), low grade ([place]) He is not symptomatic from anemia He will get darbepoetin injection today The goal for darbepoetin injection is to keep hemoglobin greater than 10 He does not need blood transfusion today We will continue blood transfusion as needed to keep hemoglobin greater than 8 g He needs 1 unit of blood whenever hemoglobin is less than 8  Goals of care, counseling/discussion We had extensive goals of care discussion in the past The patient has some baseline memory impairment His wife is the dedicated healthcare power of attorney She would like him to proceed with treatment We discussed goals of treatment is strictly palliative   Orders Placed This Encounter  Procedures  . CBC with Differential (Cancer Center Only)    Standing Status:    Standing    Number of Occurrences:   20    Standing Expiration Date:   09/28/2018  . CMP ([place] only)    Standing Status:   Standing    Number of Occurrences:   20    Standing Expiration Date:   09/28/2018  . TSH    Standing Status:   Standing    Number of Occurrences:   9    Standing Expiration Date:   09/28/2018    INTERVAL HISTORY: Please see below for problem oriented charting. He returns with his wife for further follow-up He has lost some weight He continues to have mild nonproductive cough Memory impairment is stable There were no reported recent infection The patient denies any recent signs or symptoms of bleeding such as spontaneous epistaxis, hematuria or hematochezia.  SUMMARY OF ONCOLOGIC HISTORY: Oncology History   MDS, R-IPSS score of 3, low risk (Hg 8.9, +16 chromosome on BM, 2% blast count)   Squamous cell carcinoma of the epiglottis   Primary site: Larynx - Supraglottis (Left)   Staging method: AJCC 7th Edition   Clinical: Stage I (T1, N0, M0) signed by [name], MD on 04/30/2013 12:49 PM   Pathologic: Stage I (T1, N0, cM0) signed by [name], MD on 04/30/2013 12:49 PM   Summary: Stage I (T1, N0, cM0)       MDS (myelodysplastic syndrome), low grade ([place])   02/01/2007 Bone Marrow Biopsy    BM biopsy was abnormal, overall probable low grade MDS      03/23/2011 - 02/06/2013 Chemotherapy    [name], discontinued due to diagnosis of laryngeal ca      09/06/2013  Bone Marrow Biopsy    Repeat bone marrow aspirate and biopsy confirmed low-grade myelodysplastic syndrome.      09/17/2013 -  Chemotherapy    The patient resumed darbepoetin injections to treat the anemia.       History of head and neck cancer   03/06/2013 Procedure    Biopsy from epiglottic region showed invasive [facility]      04/25/2013 Imaging    Ct scan showed no other involvement in the LN      05/13/2013 - 06/28/2013 Radiation Therapy    He received radiation  therapy, 70 gray in 35 fractions to the larynx      06/09/2016 Imaging    CT scan of the chest showed Bilateral spiculated soft tissue pulmonary masses, highly suspicious for multifocal malignancy. Borderline enlarged left-sided mediastinal lymphadenopathy. Background of severe emphysematous changes, chronic cylindrical bronchiectasis and hyperinflation of the lungs. Findings consistent with longstanding COPD. Diffusely thickened interstitial markings. This may represent chronic interstitial lung changes, however lymphangitic spread of malignancy cannot be excluded. Anterior compression deformities of several of the thoracic vertebral bodies, with heterogeneous appearance of T7 and T9 vertebral body. This may represent degenerative changes, however osseous metastatic disease cannot be excluded.       07/11/2016 PET scan    There are 2 nodules in the left upper lobe and 2 nodules within the right lower lobe which have spiculated margin and exhibit intense radiotracer uptake compatible with multifocal pulmonary metastasis versus multiple synchronous primary pulmonary neoplasms. 2. Emphysema 3. Aortic atherosclerosis and multi vessel coronary artery calcification.       Cancer of upper lobe of left lung ([place])   06/09/2016 Imaging    CT chest: Bilateral spiculated soft tissue pulmonary masses, highly suspicious for multifocal malignancy. Borderline enlarged left-sided mediastinal lymphadenopathy. Background of severe emphysematous changes, chronic cylindrical bronchiectasis and hyperinflation of the lungs. Findings consistent with longstanding COPD. Diffusely thickened interstitial markings. This may represent chronic interstitial lung changes, however lymphangitic spread of malignancy cannot be excluded. Anterior compression deformities of several of the thoracic vertebral bodies, with heterogeneous appearance of T7 and T9 vertebral body. This may represent degenerative changes, however osseous metastatic  disease cannot be excluded.       06/29/2016 Imaging    MRI brain: No evidence of metastatic disease or recent infarction. Advanced generalized brain atrophy with moderate to marked chronic small-vessel ischemic changes throughout. FLAIR imaging appears to show scattered gyri showing FLAIR hyperintensity. These abnormalities are not confirmed with restricted diffusion or contrast enhancement. Therefore, we are not certain that this is not artifactual. The differential diagnosis does include true pathology such as posterior reversible encephalopathy, viral or prion disease, postictal change in post chemotherapy change. If there is concern about active CNS pathology, re- scanning in 4-6 weeks could be useful.      07/27/2016 Imaging    CT chest: Bilateral pulmonary lesions. These are suspicious for metastatic disease. Lesions have minimally changed but there may be slight enlargement of the cavitary lesion in the right lower lobe. Severe emphysematous changes.       08/10/2016 Pathology Results    1. Lung, biopsy, RUL, (apical segment) - SCANT BENIGN LUNG PARENCHYMA. - THERE IS NO EVIDENCE OF MALIGNANCY. 2. Lung, biopsy, LUL lingula - SQUAMOUS CELL CARCINOMA. - SEE COMMENT. 3. Lung, biopsy, LUL - ADENOCARCINOMA. - SEE COMMENT. Microscopic Comment 2. The malignant cells are positive for cytokeratin 56 and p63. They are negative for TTF-1. The findings are consistent with squamous cell carcinoma. There  is likely insufficient tissue remaining for additional studies, if requested. 3. The malignant cells are positive for TTF-1 and negative for p63 and cytokeratin 5/6. The findings are consistent with adenocarcinoma.      08/10/2016 Procedure    The targets were defined as follows: Left upper lobe nodule, target 1 Lingular nodule, target 2 Proximal right lower lobe nodule, target 3 More distal right lower lobe, nodule  The extendable working channel was secured into place and the locator  guide was withdrawn. Under fluoroscopic guidance transbronchial needle brushings, transbronchial Wang needle biopsies, and transbronchial forceps biopsies were performed in the LUL (target 1) and the lingula (target 2) to be sent for cytology and pathology. A bronchioalveolar lavage was performed in the lingula in the vicinity of target 2  and sent for cytology and microbiology (bacterial, fungal, AFB smears and cultures).  Fiducial markers were then placed around target 1, target 2, and in the right lower lobe in the vicinity of both target 3 and target 4 to be used for possible radiation therapy should this be indicated. At the end of the procedure a general airway inspection was performed and there was no evidence of active bleeding. The bronchoscope was removed.  The patient tolerated the procedure well. There was no significant blood loss and there were no obvious complications. A post-procedural chest x-ray is pending.  Samples: 1. Transbronchial needle brushings from targets 1 and 2 2. Transbronchial Wang needle biopsies from targets 1 and 2 3. Transbronchial forceps biopsies from targets 1 and 2 4. Bronchoalveolar lavage from lingula, target 2 5. Endobronchial biopsies from RUL apical segment 6. Endobronchial brushings from right upper lobe apical segment       08/10/2016 Genetic Testing    Patient has genetic testing done for [employer] and PD-L1 testing Results revealed PD-L1 testing is negative 0%      08/31/2016 Procedure    Technically successful right IJ power-injectable port catheter placement. Ready for routine use      09/21/2016 - 06/08/2017 Chemotherapy    The patient had chemotherapy with carboplatin and Taxol.      11/30/2016 PET scan    1. Interval resection of the hypermetabolic lingular nodule. 2. The 3 other spiculated, hypermetabolic nodules within the left upper and right lower lobes are slightly smaller with decreased metabolic activity, consistent with some  response to interval therapy. 3. No progressive disease      03/06/2017 PET scan    1. Mixed appearance, with significant improvement in the left upper lobe nodule ; stable appearance of the more cephalad right lower lobe nodule; but enlargement and increased in metabolic activity of the more inferior right lower lobe nodule which appears cavitary. No new nodule. 2. Previously there was some moderate increase in diffuse marrow activity. That has increased further, and could be a response from prior granulocyte/marrow stimulation, although I cannot completely exclude the possibility that this is related to the patient's myelodysplastic syndrome. No CT correlate. 3. Other imaging findings of potential clinical significance: Aortic Atherosclerosis (ICD10-I70.0) and Emphysema (ICD10-J43.9). Coronary atherosclerosis. Low-density blood pool suggests anemia. Chronic right upper lobe scarring. Mucus in both mainstem bronchi. Suspected left renal cysts. Indistinct but likely enlarged prostate gland.      04/06/2017 - 04/18/2017 Radiation Therapy    He received SBRT treatment Radiation treatment dates:   04/06/17 - 04/18/17  Site/dose:   Right SBRT Lung// 50 Gy in 5 fx  Beams/energy:   Photon // SRBT/ SBT-3D  06/26/2017 PET scan    1. Clear interval response to therapy of the 2 right lower lobe pulmonary nodules, each decreased in both size and hypermetabolism. 2. Stable size and hypermetabolic activity in the left upper lobe nodule. 3. Diffusely decreased FDG accumulation within the marrow space. 4. Symmetric uptake in both parotid glands is likely physiologic. Given the symmetry, this is most likely physiologic or inflammatory. 5. Prostatomegaly with marked bladder distention. Component of bladder outlet obstruction suspected. 6. Emphysema. (ICD10-J43.9) 7. Aortic Atherosclerois (ICD10-170.0)      09/25/2017 PET scan    1. New and enlarging small hypermetabolic nodules in the lingula  are compatible with metastatic disease. These are in proximity to the linear scarring fiducial markers. 2. New hypermetabolic subcarinal nodal metastasis. 3. Left upper lobe index nodule is stable to slightly progressed in the interval in shows mild increase in hypermetabolism. 4. No substantial change in the 2 index right lower lobe nodules showing only minimal FDG uptake, as before. 5. Interval progression of patchy airspace opacity in both dependent lower lungs, compatible with infection/inflammation.      09/27/2017 -  Chemotherapy    The patient had atezolizumab (TECENTRIQ) 1,200 mg in sodium chloride 0.9 % 250 mL chemo infusion, 1,200 mg, Intravenous, Once, 0 of 6 cycles  for chemotherapy treatment.        REVIEW OF SYSTEMS:   Constitutional: Denies fevers, chills  Eyes: Denies blurriness of vision Ears, nose, mouth, throat, and face: Denies mucositis or sore throat Cardiovascular: Denies palpitation, chest discomfort or lower extremity swelling Gastrointestinal:  Denies nausea, heartburn or change in bowel habits Skin: Denies abnormal skin rashes Lymphatics: Denies new lymphadenopathy or easy bruising Neurological:Denies numbness, tingling or new weaknesses Behavioral/Psych: Mood is stable, no new changes  All other systems were reviewed with the patient and are negative.  I have reviewed the past medical history, past surgical history, social history and family history with the patient and they are unchanged from previous note.  ALLERGIES:  is allergic to no known allergies.  MEDICATIONS:  Current Outpatient Medications  Medication Sig Dispense Refill  . albuterol (PROVENTIL HFA;VENTOLIN HFA) 108 (90 Base) MCG/ACT inhaler Inhale 2 puffs into the lungs every 4 (four) hours as needed for wheezing or shortness of breath. 1 Inhaler 5  . cyanocobalamin 500 MCG tablet Take 500 mcg by mouth daily.    . ferrous sulfate 325 (65 FE) MG tablet Take 325 mg by mouth daily with breakfast.     . folic acid (FOLVITE) 1 MG tablet Take 1 mg by mouth daily.    . Glycopyrrolate-Formoterol (BEVESPI AEROSPHERE) 9-4.8 MCG/ACT AERO Inhale 2 puffs into the lungs 2 (two) times daily. 2 Inhaler 0  . levETIRAcetam (KEPPRA) 500 MG tablet TAKE 1 TABLET BY MOUTH TWICE A DAY 180 tablet 3  . vitamin E 1000 UNIT capsule Take 1,000 Units by mouth daily.      No current facility-administered medications for this visit.     PHYSICAL EXAMINATION: ECOG PERFORMANCE STATUS: 1 - Symptomatic but completely ambulatory  Vitals:   09/27/17 0947  BP: 127/70  Pulse: 93  Resp: 18  Temp: 97.6 F (36.4 C)  SpO2: 100%   Filed Weights   09/27/17 0947  Weight: 101 lb 12.8 oz (46.2 kg)    GENERAL:alert, no distress and comfortable.  He looks thin and cachectic SKIN: skin color, texture, turgor are normal, no rashes or significant lesions Musculoskeletal:no cyanosis of digits noted digital clubbing NEURO: alert & oriented x 3 with  fluent speech, no focal motor/sensory deficits  LABORATORY DATA:  I have reviewed the data as listed    Component Value Date/Time   NA 139 06/28/2017 0951   NA 142 05/03/2017 1114   K 5.0 06/28/2017 0951   K 4.5 05/03/2017 1114   CL 108 06/28/2017 0951   CL 107 09/19/2012 1325   CO2 23 06/28/2017 0951   CO2 21 (L) 05/03/2017 1114   GLUCOSE 79 06/28/2017 0951   GLUCOSE 88 05/03/2017 1114   GLUCOSE 97 09/19/2012 1325   BUN 17 06/28/2017 0951   BUN 18.5 05/03/2017 1114   CREATININE 0.80 06/28/2017 0951   CREATININE 0.9 05/03/2017 1114   CALCIUM 8.9 06/28/2017 0951   CALCIUM 8.9 05/03/2017 1114   PROT 8.2 06/28/2017 0951   PROT 8.6 (H) 05/03/2017 1114   ALBUMIN 2.9 (L) 06/28/2017 0951   ALBUMIN 3.2 (L) 05/03/2017 1114   AST 12 06/28/2017 0951   AST 9 05/03/2017 1114   ALT <6 06/28/2017 0951   ALT <6 05/03/2017 1114   ALKPHOS 91 06/28/2017 0951   ALKPHOS 126 05/03/2017 1114   BILITOT 0.4 06/28/2017 0951   BILITOT 0.34 05/03/2017 1114   GFRNONAA >60  06/28/2017 0951   GFRAA >60 06/28/2017 0951    No results found for: SPEP, UPEP  Lab Results  Component Value Date   WBC 5.2 09/27/2017   NEUTROABS 4.2 09/27/2017   HGB 8.2 (L) 09/27/2017   HCT 24.4 (L) 09/27/2017   MCV 109.7 (H) 09/27/2017   PLT 259 09/27/2017      Chemistry      Component Value Date/Time   NA 139 06/28/2017 0951   NA 142 05/03/2017 1114   K 5.0 06/28/2017 0951   K 4.5 05/03/2017 1114   CL 108 06/28/2017 0951   CL 107 09/19/2012 1325   CO2 23 06/28/2017 0951   CO2 21 (L) 05/03/2017 1114   BUN 17 06/28/2017 0951   BUN 18.5 05/03/2017 1114   CREATININE 0.80 06/28/2017 0951   CREATININE 0.9 05/03/2017 1114      Component Value Date/Time   CALCIUM 8.9 06/28/2017 0951   CALCIUM 8.9 05/03/2017 1114   ALKPHOS 91 06/28/2017 0951   ALKPHOS 126 05/03/2017 1114   AST 12 06/28/2017 0951   AST 9 05/03/2017 1114   ALT <6 06/28/2017 0951   ALT <6 05/03/2017 1114   BILITOT 0.4 06/28/2017 0951   BILITOT 0.34 05/03/2017 1114       RADIOGRAPHIC STUDIES: I have reviewed multiple imaging study with the patient and wife I have personally reviewed the radiological images as listed and agreed with the findings in the report. Nm Pet Image Restag (ps) Skull Base To Thigh  Result Date: 09/25/2017 CLINICAL DATA:  Subsequent treatment strategy for metastatic non-small cell lung cancer. EXAM: NUCLEAR MEDICINE PET SKULL BASE TO THIGH TECHNIQUE: 5.1 mCi F-18 FDG was injected intravenously. Full-ring PET imaging was performed from the skull base to thigh after the radiotracer. CT data was obtained and used for attenuation correction and anatomic localization. Fasting blood glucose: 92 mg/dl COMPARISON:  06/26/2017 FINDINGS: Mediastinal blood pool activity: SUV max 1.8 NECK: The symmetric and diffuse uptake seen previously in the parotid gland has decreased in the interval. No hypermetabolic lymphadenopathy in the neck. Incidental CT findings: none CHEST: Left upper lobe nodule  stable to slightly increased in size in the interval measuring 2.2 x 2.2 cm today compared to 2.1 x 1.9 cm previously. This nodule remains hypermetabolic with SUV max =  5.7 today compared to 3.0 previously. Right lower lobe nodule measured previously at 13 x 16 mm now measures 13 x 17 mm. Similar low level FDG accumulation in this nodule with stable SUV max = 1.8. Peripheral cystic and solid right lower lobe lesion (image 52/8) measures 13 x 17 mm today compared to 16 x 21 mm previously. Low level FDG accumulation again identified with SUV max = 2.7 today compared to 1.9 previously. There is a new 8 mm nodule in the lingula, in the region of the fiducial markers along the bandlike scarring. This nodule is markedly hypermetabolic for size with SUV max = 5.4. 8 mm nodule seen in the lingula (image 59/series 8) has increased from 4 mm previously. This nodule is hypermetabolic with SUV max = 4.7. Interval development of hypermetabolic lymphadenopathy in the subcarinal station. Although not well demonstrated on noncontrast CT imaging, 11 mm subcarinal lymph node demonstrates SUV max = 5.6 Incidental CT findings: Coronary artery calcification is evident. Atherosclerotic calcification is noted in the wall of the thoracic aorta. Right Port-A-Cath tip is positioned in the upper right atrium. Emphysema noted with posterior right pleuroparenchymal scarring and bullous change in the upper lungs, right greater than left. Patchy airspace disease in the lower lungs is slightly progressive in the interval and may represent infection/inflammation. ABDOMEN/PELVIS: No abnormal hypermetabolic activity within the liver, pancreas, adrenal glands, or spleen. No hypermetabolic lymph nodes in the abdomen or pelvis. Incidental CT findings: Bilateral renal cysts. Aortic atherosclerosis. Urinary bladder is markedly distended. SKELETON: Interval decrease in diffuse marrow uptake. Incidental CT findings: none IMPRESSION: 1. New and enlarging  small hypermetabolic nodules in the lingula are compatible with metastatic disease. These are in proximity to the linear scarring fiducial markers. 2. New hypermetabolic subcarinal nodal metastasis. 3. Left upper lobe index nodule is stable to slightly progressed in the interval in shows mild increase in hypermetabolism. 4. No substantial change in the 2 index right lower lobe nodules showing only minimal FDG uptake, as before. 5. Interval progression of patchy airspace opacity in both dependent lower lungs, compatible with infection/inflammation. Electronically Signed   By: Misty Stanley M.D.   On: 09/25/2017 13:24    All questions were answered. The patient knows to call the clinic with any problems, questions or concerns. No barriers to learning was detected.  I spent 30 minutes counseling the patient face to face. The total time spent in the appointment was 40 minutes and more than 50% was on counseling and review of test results  [name], MD 09/27/2017 10:31 AM

## 2017-09-27 NOTE — Assessment & Plan Note (Signed)
PET CT scan is reviewed with the patient's and his wife It showed evidence of disease progression He tolerated prior chemotherapy poorly Previous biopsy showed insufficient tissue cell for additional studies Given his frail status, I recommend single agent immunotherapy only The risk, benefits, side effects of treatment including risk of pneumonitis, allergic reaction, hypothyroidism and others were discussed and he agreed to proceed I will schedule treatment to start next week I recommend minimum 3 months of treatment before repeat imaging study The patient and his wife is aware that the goals of treatment is palliative only

## 2017-09-27 NOTE — Telephone Encounter (Signed)
Gave avs and calendar ° °

## 2017-10-12 ENCOUNTER — Inpatient Hospital Stay: Payer: Medicare Other

## 2017-10-12 ENCOUNTER — Inpatient Hospital Stay: Payer: Medicare Other | Attending: Hematology and Oncology

## 2017-10-12 VITALS — BP 114/71 | HR 70 | Temp 98.2°F | Resp 18

## 2017-10-12 DIAGNOSIS — D4620 Refractory anemia with excess of blasts, unspecified: Secondary | ICD-10-CM | POA: Insufficient documentation

## 2017-10-12 DIAGNOSIS — Z79899 Other long term (current) drug therapy: Secondary | ICD-10-CM | POA: Diagnosis not present

## 2017-10-12 DIAGNOSIS — Z923 Personal history of irradiation: Secondary | ICD-10-CM | POA: Insufficient documentation

## 2017-10-12 DIAGNOSIS — D46Z Other myelodysplastic syndromes: Secondary | ICD-10-CM

## 2017-10-12 DIAGNOSIS — Z9221 Personal history of antineoplastic chemotherapy: Secondary | ICD-10-CM | POA: Insufficient documentation

## 2017-10-12 DIAGNOSIS — J449 Chronic obstructive pulmonary disease, unspecified: Secondary | ICD-10-CM | POA: Diagnosis not present

## 2017-10-12 DIAGNOSIS — C3412 Malignant neoplasm of upper lobe, left bronchus or lung: Secondary | ICD-10-CM

## 2017-10-12 DIAGNOSIS — C7839 Secondary malignant neoplasm of other respiratory organs: Secondary | ICD-10-CM | POA: Diagnosis not present

## 2017-10-12 DIAGNOSIS — Z5112 Encounter for antineoplastic immunotherapy: Secondary | ICD-10-CM | POA: Insufficient documentation

## 2017-10-12 DIAGNOSIS — D61818 Other pancytopenia: Secondary | ICD-10-CM

## 2017-10-12 DIAGNOSIS — R64 Cachexia: Secondary | ICD-10-CM | POA: Diagnosis not present

## 2017-10-12 LAB — CMP (CANCER CENTER ONLY)
ALBUMIN: 3.1 g/dL — AB (ref 3.5–5.0)
AST: 12 U/L (ref 5–34)
Alkaline Phosphatase: 90 U/L (ref 40–150)
Anion gap: 8 (ref 3–11)
BUN: 25 mg/dL (ref 7–26)
CHLORIDE: 111 mmol/L — AB (ref 98–109)
CO2: 21 mmol/L — ABNORMAL LOW (ref 22–29)
CREATININE: 0.95 mg/dL (ref 0.70–1.30)
Calcium: 8.8 mg/dL (ref 8.4–10.4)
GFR, Estimated: >60 mL/min (ref >60)
GLUCOSE: 112 mg/dL (ref 70–140)
Potassium: 4.1 mmol/L (ref 3.5–5.1)
Sodium: 140 mmol/L (ref 136–145)
Total Bilirubin: 0.3 mg/dL (ref 0.2–1.2)
Total Protein: 8.6 g/dL — ABNORMAL HIGH (ref 6.4–8.3)

## 2017-10-12 LAB — CBC WITH DIFFERENTIAL (CANCER CENTER ONLY)
BASOS PCT: 1 %
Basophils Absolute: 0 10*3/uL (ref 0.0–0.1)
EOS ABS: 0 10*3/uL (ref 0.0–0.5)
EOS PCT: 1 %
HCT: 25.2 % — ABNORMAL LOW (ref 38.4–49.9)
HEMOGLOBIN: 8 g/dL — AB (ref 13.0–17.1)
Lymphocytes Relative: 12 %
Lymphs Abs: 0.5 10*3/uL — ABNORMAL LOW (ref 0.9–3.3)
MCH: 36.7 pg — ABNORMAL HIGH (ref 27.2–33.4)
MCHC: 31.7 g/dL — ABNORMAL LOW (ref 32.0–36.0)
MCV: 115.6 fL — ABNORMAL HIGH (ref 79.3–98.0)
MONOS PCT: 9 %
Monocytes Absolute: 0.4 10*3/uL (ref 0.1–0.9)
NEUTROS PCT: 77 %
Neutro Abs: 3.4 10*3/uL (ref 1.5–6.5)
Platelet Count: 219 10*3/uL (ref 140–400)
RBC: 2.18 MIL/uL — ABNORMAL LOW (ref 4.20–5.82)
WBC Count: 4.4 10*3/uL (ref 4.0–10.3)

## 2017-10-12 LAB — TSH: TSH: 0.973 u[IU]/mL (ref 0.320–4.118)

## 2017-10-12 LAB — SAMPLE TO BLOOD BANK

## 2017-10-12 LAB — PREPARE RBC (CROSSMATCH)

## 2017-10-12 MED ORDER — SODIUM CHLORIDE 0.9% FLUSH
3.0000 mL | INTRAVENOUS | Status: DC | PRN
  Filled 2017-10-12: qty 10

## 2017-10-12 MED ORDER — DIPHENHYDRAMINE HCL 25 MG PO CAPS
25.0000 mg | ORAL_CAPSULE | Freq: Once | ORAL | Status: AC
  Administered 2017-10-12: 25 mg via ORAL

## 2017-10-12 MED ORDER — ACETAMINOPHEN 325 MG PO TABS
650.0000 mg | ORAL_TABLET | Freq: Once | ORAL | Status: AC
  Administered 2017-10-12: 650 mg via ORAL

## 2017-10-12 MED ORDER — SODIUM CHLORIDE 0.9% FLUSH
10.0000 mL | INTRAVENOUS | Status: DC | PRN
  Filled 2017-10-12: qty 10

## 2017-10-12 MED ORDER — HEPARIN SOD (PORK) LOCK FLUSH 100 UNIT/ML IV SOLN
500.0000 [IU] | Freq: Once | INTRAVENOUS | Status: DC | PRN
  Filled 2017-10-12: qty 5

## 2017-10-12 MED ORDER — HEPARIN SOD (PORK) LOCK FLUSH 100 UNIT/ML IV SOLN
500.0000 [IU] | Freq: Every day | INTRAVENOUS | Status: AC | PRN
  Administered 2017-10-12: 500 [IU]
  Filled 2017-10-12: qty 5

## 2017-10-12 MED ORDER — DARBEPOETIN ALFA 500 MCG/ML IJ SOSY
500.0000 ug | PREFILLED_SYRINGE | Freq: Once | INTRAMUSCULAR | Status: AC
  Administered 2017-10-12: 500 ug via SUBCUTANEOUS

## 2017-10-12 MED ORDER — ACETAMINOPHEN 325 MG PO TABS
ORAL_TABLET | ORAL | Status: AC
Start: 2017-10-12 — End: ?
  Filled 2017-10-12: qty 2

## 2017-10-12 MED ORDER — SODIUM CHLORIDE 0.9 % IV SOLN
250.0000 mL | Freq: Once | INTRAVENOUS | Status: AC
  Administered 2017-10-12: 250 mL via INTRAVENOUS

## 2017-10-12 MED ORDER — DARBEPOETIN ALFA 500 MCG/ML IJ SOSY
PREFILLED_SYRINGE | INTRAMUSCULAR | Status: AC
  Filled 2017-10-12: qty 1

## 2017-10-12 MED ORDER — SODIUM CHLORIDE 0.9 % IV SOLN: Freq: Once | INTRAVENOUS | Status: DC

## 2017-10-12 MED ORDER — DIPHENHYDRAMINE HCL 25 MG PO CAPS
ORAL_CAPSULE | ORAL | Status: AC
Start: 2017-10-12 — End: ?
  Filled 2017-10-12: qty 1

## 2017-10-12 MED ORDER — SODIUM CHLORIDE 0.9% FLUSH
10.0000 mL | INTRAVENOUS | Status: AC | PRN
  Administered 2017-10-12: 10 mL
  Filled 2017-10-12: qty 10

## 2017-10-12 MED ORDER — SODIUM CHLORIDE 0.9 % IV SOLN
1200.0000 mg | Freq: Once | INTRAVENOUS | Status: AC
  Administered 2017-10-12: 1200 mg via INTRAVENOUS
  Filled 2017-10-12: qty 20

## 2017-10-12 MED ORDER — SODIUM CHLORIDE 0.9% FLUSH
10.0000 mL | Freq: Once | INTRAVENOUS | Status: AC
  Administered 2017-10-12: 10 mL
  Filled 2017-10-12: qty 10

## 2017-10-12 NOTE — Patient Instructions (Signed)
Atezolizumab injection What is this medicine? ATEZOLIZUMAB (a te zoe LIZ ue mab) is a monoclonal antibody. It is used to treat bladder cancer (urothelial cancer) and non-small cell lung cancer. This medicine may be used for other purposes; ask your health care provider or pharmacist if you have questions. COMMON BRAND NAME(S): Tecentriq What should I tell my health care provider before I take this medicine? They need to know if you have any of these conditions: -diabetes -immune system problems -infection -inflammatory bowel disease -liver disease -lung or breathing disease -lupus -nervous system problems like myasthenia gravis or Guillain-Barre syndrome -organ transplant -an unusual or allergic reaction to atezolizumab, other medicines, foods, dyes, or preservatives -pregnant or trying to get pregnant -breast-feeding How should I use this medicine? This medicine is for infusion into a vein. It is given by a health care professional in a hospital or clinic setting. A special MedGuide will be given to you before each treatment. Be sure to read this information carefully each time. Talk to your pediatrician regarding the use of this medicine in children. Special care may be needed. Overdosage: If you think you have taken too much of this medicine contact a poison control center or emergency room at once. NOTE: This medicine is only for you. Do not share this medicine with others. What if I miss a dose? It is important not to miss your dose. Call your doctor or health care professional if you are unable to keep an appointment. What may interact with this medicine? Interactions have not been studied. This list may not describe all possible interactions. Give your health care provider a list of all the medicines, herbs, non-prescription drugs, or dietary supplements you use. Also tell them if you smoke, drink alcohol, or use illegal drugs. Some items may interact with your medicine. What  should I watch for while using this medicine? Your condition will be monitored carefully while you are receiving this medicine. You may need blood work done while you are taking this medicine. Do not become pregnant while taking this medicine or for at least 5 months after stopping it. Women should inform their doctor if they wish to become pregnant or think they might be pregnant. There is a potential for serious side effects to an unborn child. Talk to your health care professional or pharmacist for more information. Do not breast-feed an infant while taking this medicine or for at least 5 months after the last dose. What side effects may I notice from receiving this medicine? Side effects that you should report to your doctor or health care professional as soon as possible: -allergic reactions like skin rash, itching or hives, swelling of the face, lips, or tongue -black, tarry stools -bloody or watery diarrhea -breathing problems -changes in vision -chest pain or chest tightness -chills -facial flushing -fever -headache -signs and symptoms of high blood sugar such as dizziness; dry mouth; dry skin; fruity breath; nausea; stomach pain; increased hunger or thirst; increased urination -signs and symptoms of liver injury like dark yellow or brown urine; general ill feeling or flu-like symptoms; light-colored stools; loss of appetite; nausea; right upper belly pain; unusually weak or tired; yellowing of the eyes or skin -stomach pain -trouble passing urine or change in the amount of urine Side effects that usually do not require medical attention (report to your doctor or health care professional if they continue or are bothersome): -cough -diarrhea -joint pain -muscle pain -muscle weakness -tiredness -weight loss This list may not describe all  possible side effects. Call your doctor for medical advice about side effects. You may report side effects to FDA at 1-800-FDA-1088. Where should  I keep my medicine? This drug is given in a hospital or clinic and will not be stored at home. NOTE: This sheet is a summary. It may not cover all possible information. If you have questions about this medicine, talk to your doctor, pharmacist, or health care provider.  2018 Elsevier/Gold Standard (2015-05-27 17:54:14) Darbepoetin Alfa injection What is this medicine? DARBEPOETIN ALFA (dar be POE e tin AL fa) helps your body make more red blood cells. It is used to treat anemia caused by chronic kidney failure and chemotherapy. This medicine may be used for other purposes; ask your health care provider or pharmacist if you have questions. COMMON BRAND NAME(S): Aranesp What should I tell my health care provider before I take this medicine? They need to know if you have any of these conditions: -blood clotting disorders or history of blood clots -cancer patient not on chemotherapy -cystic fibrosis -heart disease, such as angina, heart failure, or a history of a heart attack -hemoglobin level of 12 g/dL or greater -high blood pressure -low levels of folate, iron, or vitamin B12 -seizures -an unusual or allergic reaction to darbepoetin, erythropoietin, albumin, hamster proteins, latex, other medicines, foods, dyes, or preservatives -pregnant or trying to get pregnant -breast-feeding How should I use this medicine? This medicine is for injection into a vein or under the skin. It is usually given by a health care professional in a hospital or clinic setting. If you get this medicine at home, you will be taught how to prepare and give this medicine. Use exactly as directed. Take your medicine at regular intervals. Do not take your medicine more often than directed. It is important that you put your used needles and syringes in a special sharps container. Do not put them in a trash can. If you do not have a sharps container, call your pharmacist or healthcare provider to get one. A special  MedGuide will be given to you by the pharmacist with each prescription and refill. Be sure to read this information carefully each time. Talk to your pediatrician regarding the use of this medicine in children. While this medicine may be used in children as young as 1 year for selected conditions, precautions do apply. Overdosage: If you think you have taken too much of this medicine contact a poison control center or emergency room at once. NOTE: This medicine is only for you. Do not share this medicine with others. What if I miss a dose? If you miss a dose, take it as soon as you can. If it is almost time for your next dose, take only that dose. Do not take double or extra doses. What may interact with this medicine? Do not take this medicine with any of the following medications: -epoetin alfa This list may not describe all possible interactions. Give your health care provider a list of all the medicines, herbs, non-prescription drugs, or dietary supplements you use. Also tell them if you smoke, drink alcohol, or use illegal drugs. Some items may interact with your medicine. What should I watch for while using this medicine? Your condition will be monitored carefully while you are receiving this medicine. You may need blood work done while you are taking this medicine. What side effects may I notice from receiving this medicine? Side effects that you should report to your doctor or health care professional as  soon as possible: -allergic reactions like skin rash, itching or hives, swelling of the face, lips, or tongue -breathing problems -changes in vision -chest pain -confusion, trouble speaking or understanding -feeling faint or lightheaded, falls -high blood pressure -muscle aches or pains -pain, swelling, warmth in the leg -rapid weight gain -severe headaches -sudden numbness or weakness of the face, arm or leg -trouble walking, dizziness, loss of balance or coordination -seizures  (convulsions) -swelling of the ankles, feet, hands -unusually weak or tired Side effects that usually do not require medical attention (report to your doctor or health care professional if they continue or are bothersome): -diarrhea -fever, chills (flu-like symptoms) -headaches -nausea, vomiting -redness, stinging, or swelling at site where injected This list may not describe all possible side effects. Call your doctor for medical advice about side effects. You may report side effects to FDA at 1-800-FDA-1088. Where should I keep my medicine? Keep out of the reach of children. Store in a refrigerator between 2 and 8 degrees C (36 and 46 degrees F). Do not freeze. Do not shake. Throw away any unused portion if using a single-dose vial. Throw away any unused medicine after the expiration date. NOTE: This sheet is a summary. It may not cover all possible information. If you have questions about this medicine, talk to your doctor, pharmacist, or health care provider.  2018 Elsevier/Gold Standard (2015-12-14 19:52:26)  Blood Transfusion A blood transfusion is a procedure in which you are given blood through an IV tube. You may need this procedure because of:  Illness.  Surgery.  Injury.  The blood may come from someone else (a donor). You may also be able to donate blood for yourself (autologous blood donation). The blood given in a transfusion is made up of different types of cells. You may get:  Red blood cells. These carry oxygen to the cells in the body.  White blood cells. These help you fight infections.  Platelets. These help your blood to clot.  Plasma. This is the liquid part of your blood. It helps with fluid imbalances.  If you have a clotting disorder, you may also get other types of blood products. What happens before the procedure?  You will have a blood test to find out your blood type. The test also finds out what type of blood your body will accept and matches it to  the donor type.  If you are going to have a planned surgery, you may be able to donate your own blood. This may be done in case you need a transfusion.  If you have had an allergic reaction to a transfusion in the past, you may be given medicine to help prevent a reaction. This medicine may be given to you by mouth or through an IV.  You will have your temperature, blood pressure, and pulse checked.  Follow instructions from your doctor about what you cannot eat or drink.  Ask your doctor about: ? Changing or stopping your regular medicines. This is important if you take diabetes medicines or blood thinners. ? Taking medicines such as aspirin and ibuprofen. These medicines can thin your blood. Do not take these medicines before your procedure if your doctor tells you not to. What happens during the procedure?  An IV tube will be put into one of your veins.  The bag of donated blood will be attached to your IV tube. Then, the blood will enter through your vein.  Your temperature, blood pressure, and pulse will be checked  regularly during the procedure. This is done to find early signs of a transfusion reaction.  If you have any signs or symptoms of a reaction, your transfusion will be stopped. You may also be given medicine.  When the transfusion is done, your IV tube will be taken out.  Pressure may be applied to the IV site for a few minutes.  A bandage (dressing) will be put on the IV site. The procedure may vary among doctors and hospitals. What happens after the procedure?  Your temperature, blood pressure, heart rate, breathing rate, and blood oxygen level will be checked often.  Your blood may be tested to see how you are responding to the transfusion.  You may be warmed with fluids or blankets. This is done to keep the temperature of your body normal. Summary  A blood transfusion is a procedure in which you are given blood through an IV tube.  The blood may come from  someone else (a donor). You may also be able to donate blood for yourself.  If you have had an allergic reaction to a transfusion in the past, you may be given medicine to help prevent a reaction. This medicine may be given to you by mouth or through an IV tube.  Your temperature, blood pressure, heart rate, breathing rate, and blood oxygen level will be checked often.  Your blood may be tested to see how you are responding to the transfusion. This information is not intended to replace advice given to you by your health care provider. Make sure you discuss any questions you have with your health care provider. Document Released: 07/22/2008 Document Revised: 12/18/2015 Document Reviewed: 12/18/2015 Elsevier Interactive Patient Education  2017 [employer].

## 2017-10-13 LAB — BPAM RBC
Blood Product Expiration Date: 201907042359
ISSUE DATE / TIME: 201906061436
Unit Type and Rh: 5100

## 2017-10-13 LAB — TYPE AND SCREEN
ABO/RH(D): O POS
Antibody Screen: NEGATIVE
Donor AG Type: NEGATIVE
UNIT DIVISION: 0

## 2017-10-25 ENCOUNTER — Inpatient Hospital Stay: Payer: Medicare Other

## 2017-10-25 VITALS — BP 131/82 | HR 92 | Temp 98.2°F | Resp 16

## 2017-10-25 DIAGNOSIS — D4620 Refractory anemia with excess of blasts, unspecified: Secondary | ICD-10-CM

## 2017-10-25 DIAGNOSIS — D61818 Other pancytopenia: Secondary | ICD-10-CM

## 2017-10-25 DIAGNOSIS — C3412 Malignant neoplasm of upper lobe, left bronchus or lung: Secondary | ICD-10-CM

## 2017-10-25 LAB — CMP (CANCER CENTER ONLY)
ALBUMIN: 3 g/dL — AB (ref 3.5–5.0)
AST: 8 U/L (ref 5–34)
Alkaline Phosphatase: 86 U/L (ref 40–150)
Anion gap: 8 (ref 3–11)
BILIRUBIN TOTAL: 0.4 mg/dL (ref 0.2–1.2)
BUN: 18 mg/dL (ref 7–26)
CO2: 23 mmol/L (ref 22–29)
CREATININE: 0.85 mg/dL (ref 0.70–1.30)
Calcium: 8.9 mg/dL (ref 8.4–10.4)
Chloride: 109 mmol/L (ref 98–109)
GFR, Est AFR Am: >60 mL/min (ref >60)
GFR, Estimated: >60 mL/min (ref >60)
GLUCOSE: 86 mg/dL (ref 70–140)
Potassium: 4.2 mmol/L (ref 3.5–5.1)
Sodium: 140 mmol/L (ref 136–145)
Total Protein: 8.5 g/dL — ABNORMAL HIGH (ref 6.4–8.3)

## 2017-10-25 LAB — CBC WITH DIFFERENTIAL (CANCER CENTER ONLY)
BASOS ABS: 0 10*3/uL (ref 0.0–0.1)
Basophils Relative: 1 %
Eosinophils Absolute: 0.1 10*3/uL (ref 0.0–0.5)
Eosinophils Relative: 2 %
HEMATOCRIT: 28.8 % — AB (ref 38.4–49.9)
HEMOGLOBIN: 9.7 g/dL — AB (ref 13.0–17.1)
LYMPHS PCT: 12 %
Lymphs Abs: 0.5 10*3/uL — ABNORMAL LOW (ref 0.9–3.3)
MCH: 37.3 pg — ABNORMAL HIGH (ref 27.2–33.4)
MCHC: 33.6 g/dL (ref 32.0–36.0)
MCV: 111.1 fL — ABNORMAL HIGH (ref 79.3–98.0)
Monocytes Absolute: 0.3 10*3/uL (ref 0.1–0.9)
Monocytes Relative: 7 %
NEUTROS ABS: 3.2 10*3/uL (ref 1.5–6.5)
NEUTROS PCT: 78 %
Platelet Count: 241 10*3/uL (ref 140–400)
RBC: 2.59 MIL/uL — AB (ref 4.20–5.82)
RDW: 28.6 % — ABNORMAL HIGH (ref 11.0–14.6)
WBC: 4.2 10*3/uL (ref 4.0–10.3)

## 2017-10-25 LAB — SAMPLE TO BLOOD BANK

## 2017-10-25 MED ORDER — DARBEPOETIN ALFA 500 MCG/ML IJ SOSY
500.0000 ug | PREFILLED_SYRINGE | Freq: Once | INTRAMUSCULAR | Status: AC
  Administered 2017-10-25: 500 ug via SUBCUTANEOUS

## 2017-10-25 MED ORDER — DARBEPOETIN ALFA 500 MCG/ML IJ SOSY
PREFILLED_SYRINGE | INTRAMUSCULAR | Status: AC
  Filled 2017-10-25: qty 1

## 2017-10-25 NOTE — Patient Instructions (Signed)

## 2017-11-01 ENCOUNTER — Inpatient Hospital Stay: Payer: Medicare Other

## 2017-11-01 ENCOUNTER — Encounter: Payer: Self-pay | Admitting: Hematology and Oncology

## 2017-11-01 ENCOUNTER — Telehealth: Payer: Self-pay | Admitting: Hematology and Oncology

## 2017-11-01 ENCOUNTER — Inpatient Hospital Stay (HOSPITAL_BASED_OUTPATIENT_CLINIC_OR_DEPARTMENT_OTHER): Payer: Medicare Other | Admitting: Hematology and Oncology

## 2017-11-01 DIAGNOSIS — J449 Chronic obstructive pulmonary disease, unspecified: Secondary | ICD-10-CM | POA: Diagnosis not present

## 2017-11-01 DIAGNOSIS — R64 Cachexia: Secondary | ICD-10-CM

## 2017-11-01 DIAGNOSIS — Z79899 Other long term (current) drug therapy: Secondary | ICD-10-CM | POA: Diagnosis not present

## 2017-11-01 DIAGNOSIS — C3412 Malignant neoplasm of upper lobe, left bronchus or lung: Secondary | ICD-10-CM

## 2017-11-01 DIAGNOSIS — Z923 Personal history of irradiation: Secondary | ICD-10-CM

## 2017-11-01 DIAGNOSIS — D4620 Refractory anemia with excess of blasts, unspecified: Secondary | ICD-10-CM

## 2017-11-01 DIAGNOSIS — C7839 Secondary malignant neoplasm of other respiratory organs: Secondary | ICD-10-CM | POA: Diagnosis not present

## 2017-11-01 DIAGNOSIS — Z9221 Personal history of antineoplastic chemotherapy: Secondary | ICD-10-CM | POA: Diagnosis not present

## 2017-11-01 DIAGNOSIS — D61818 Other pancytopenia: Secondary | ICD-10-CM

## 2017-11-01 DIAGNOSIS — Z720 Tobacco use: Secondary | ICD-10-CM

## 2017-11-01 LAB — CMP (CANCER CENTER ONLY)
ALT: <6 U/L (ref 0–44)
ANION GAP: 8 (ref 5–15)
AST: 8 U/L — ABNORMAL LOW (ref 15–41)
Albumin: 2.9 g/dL — ABNORMAL LOW (ref 3.5–5.0)
Alkaline Phosphatase: 88 U/L (ref 38–126)
BUN: 20 mg/dL (ref 8–23)
CHLORIDE: 109 mmol/L (ref 98–111)
CO2: 24 mmol/L (ref 22–32)
Calcium: 8.7 mg/dL — ABNORMAL LOW (ref 8.9–10.3)
Creatinine: 0.82 mg/dL (ref 0.61–1.24)
Glucose, Bld: 89 mg/dL (ref 70–99)
Potassium: 3.8 mmol/L (ref 3.5–5.1)
Sodium: 141 mmol/L (ref 135–145)
Total Bilirubin: 0.4 mg/dL (ref 0.3–1.2)
Total Protein: 8.2 g/dL — ABNORMAL HIGH (ref 6.5–8.1)

## 2017-11-01 LAB — CBC WITH DIFFERENTIAL (CANCER CENTER ONLY)
Basophils Absolute: 0 10*3/uL (ref 0.0–0.1)
Basophils Relative: 1 %
EOS PCT: 1 %
Eosinophils Absolute: 0.1 10*3/uL (ref 0.0–0.5)
HCT: 27.2 % — ABNORMAL LOW (ref 38.4–49.9)
Hemoglobin: 8.6 g/dL — ABNORMAL LOW (ref 13.0–17.1)
LYMPHS ABS: 0.6 10*3/uL — AB (ref 0.9–3.3)
Lymphocytes Relative: 12 %
MCH: 36.3 pg — AB (ref 27.2–33.4)
MCHC: 31.6 g/dL — AB (ref 32.0–36.0)
MCV: 114.8 fL — ABNORMAL HIGH (ref 79.3–98.0)
MONO ABS: 0.4 10*3/uL (ref 0.1–0.9)
MONOS PCT: 8 %
NEUTROS PCT: 78 %
Neutro Abs: 3.6 10*3/uL (ref 1.5–6.5)
PLATELETS: 252 10*3/uL (ref 140–400)
RBC: 2.37 MIL/uL — ABNORMAL LOW (ref 4.20–5.82)
WBC Count: 4.7 10*3/uL (ref 4.0–10.3)

## 2017-11-01 LAB — SAMPLE TO BLOOD BANK

## 2017-11-01 LAB — TSH: TSH: 0.984 u[IU]/mL (ref 0.320–4.118)

## 2017-11-01 MED ORDER — SODIUM CHLORIDE 0.9 % IV SOLN
1200.0000 mg | Freq: Once | INTRAVENOUS | Status: AC
  Administered 2017-11-01: 1200 mg via INTRAVENOUS
  Filled 2017-11-01: qty 20

## 2017-11-01 MED ORDER — SODIUM CHLORIDE 0.9 % IV SOLN
Freq: Once | INTRAVENOUS | Status: AC
  Administered 2017-11-01: 11:00:00 via INTRAVENOUS

## 2017-11-01 MED ORDER — SODIUM CHLORIDE 0.9% FLUSH
10.0000 mL | Freq: Once | INTRAVENOUS | Status: AC
  Administered 2017-11-01: 10 mL
  Filled 2017-11-01: qty 10

## 2017-11-01 MED ORDER — HEPARIN SOD (PORK) LOCK FLUSH 100 UNIT/ML IV SOLN
500.0000 [IU] | Freq: Once | INTRAVENOUS | Status: AC | PRN
Start: 2017-11-01 — End: 2017-11-01
  Administered 2017-11-01: 500 [IU]
  Filled 2017-11-01: qty 5

## 2017-11-01 MED ORDER — SODIUM CHLORIDE 0.9% FLUSH
10.0000 mL | INTRAVENOUS | Status: DC | PRN
  Administered 2017-11-01: 10 mL
  Filled 2017-11-01: qty 10

## 2017-11-01 NOTE — Assessment & Plan Note (Signed)
The patient is profoundly cachectic I suspect this is due to disease He will continue frequent small meals as tolerated

## 2017-11-01 NOTE — Patient Instructions (Signed)
[facility] Discharge Instructions for Patients Receiving Chemotherapy  Today you received the following chemotherapy agents: Tecentriq.  To help prevent nausea and vomiting after your treatment, we encourage you to take your nausea medication as directed.   If you develop nausea and vomiting that is not controlled by your nausea medication, call the clinic.   BELOW ARE SYMPTOMS THAT SHOULD BE REPORTED IMMEDIATELY:  *FEVER GREATER THAN 100.5 F  *CHILLS WITH OR WITHOUT FEVER  NAUSEA AND VOMITING THAT IS NOT CONTROLLED WITH YOUR NAUSEA MEDICATION  *UNUSUAL SHORTNESS OF BREATH  *UNUSUAL BRUISING OR BLEEDING  TENDERNESS IN MOUTH AND THROAT WITH OR WITHOUT PRESENCE OF ULCERS  *URINARY PROBLEMS  *BOWEL PROBLEMS  UNUSUAL RASH Items with * indicate a potential emergency and should be followed up as soon as possible.  Feel free to call the clinic should you have any questions or concerns. The clinic phone number is [phone].  Please show the [place] at check-in to the Emergency Department and triage nurse. Atezolizumab injection What is this medicine? ATEZOLIZUMAB (a te zoe LIZ ue mab) is a monoclonal antibody. It is used to treat bladder cancer (urothelial cancer) and non-small cell lung cancer. This medicine may be used for other purposes; ask your health care provider or pharmacist if you have questions. COMMON BRAND NAME(S): Tecentriq What should I tell my health care provider before I take this medicine? They need to know if you have any of these conditions: -diabetes -immune system problems -infection -inflammatory bowel disease -liver disease -lung or breathing disease -lupus -nervous system problems like myasthenia gravis or Guillain-Barre syndrome -organ transplant -an unusual or allergic reaction to atezolizumab, other medicines, foods, dyes, or preservatives -pregnant or trying to get pregnant -breast-feeding How should I use this  medicine? This medicine is for infusion into a vein. It is given by a health care professional in a hospital or clinic setting. A special MedGuide will be given to you before each treatment. Be sure to read this information carefully each time. Talk to your pediatrician regarding the use of this medicine in children. Special care may be needed. Overdosage: If you think you have taken too much of this medicine contact a poison control center or emergency room at once. NOTE: This medicine is only for you. Do not share this medicine with others. What if I miss a dose? It is important not to miss your dose. Call your doctor or health care professional if you are unable to keep an appointment. What may interact with this medicine? Interactions have not been studied. This list may not describe all possible interactions. Give your health care provider a list of all the medicines, herbs, non-prescription drugs, or dietary supplements you use. Also tell them if you smoke, drink alcohol, or use illegal drugs. Some items may interact with your medicine. What should I watch for while using this medicine? Your condition will be monitored carefully while you are receiving this medicine. You may need blood work done while you are taking this medicine. Do not become pregnant while taking this medicine or for at least 5 months after stopping it. Women should inform their doctor if they wish to become pregnant or think they might be pregnant. There is a potential for serious side effects to an unborn child. Talk to your health care professional or pharmacist for more information. Do not breast-feed an infant while taking this medicine or for at least 5 months after the last dose. What side effects  may I notice from receiving this medicine? Side effects that you should report to your doctor or health care professional as soon as possible: -allergic reactions like skin rash, itching or hives, swelling of the face,  lips, or tongue -black, tarry stools -bloody or watery diarrhea -breathing problems -changes in vision -chest pain or chest tightness -chills -facial flushing -fever -headache -signs and symptoms of high blood sugar such as dizziness; dry mouth; dry skin; fruity breath; nausea; stomach pain; increased hunger or thirst; increased urination -signs and symptoms of liver injury like dark yellow or brown urine; general ill feeling or flu-like symptoms; light-colored stools; loss of appetite; nausea; right upper belly pain; unusually weak or tired; yellowing of the eyes or skin -stomach pain -trouble passing urine or change in the amount of urine Side effects that usually do not require medical attention (report to your doctor or health care professional if they continue or are bothersome): -cough -diarrhea -joint pain -muscle pain -muscle weakness -tiredness -weight loss This list may not describe all possible side effects. Call your doctor for medical advice about side effects. You may report side effects to FDA at 1-800-FDA-1088. Where should I keep my medicine? This drug is given in a hospital or clinic and will not be stored at home. NOTE: This sheet is a summary. It may not cover all possible information. If you have questions about this medicine, talk to your doctor, pharmacist, or health care provider.  2018 Elsevier/Gold Standard (2015-05-27 17:54:14)

## 2017-11-01 NOTE — Telephone Encounter (Signed)
Gave avs and calendar had to decouple 7/17

## 2017-11-01 NOTE — Progress Notes (Signed)
[place] OFFICE PROGRESS NOTE  Patient Care Team: [name], MD as PCP - General (Family Medicine) [name], MD as Consulting Physician (Hematology and Oncology)  ASSESSMENT & PLAN:  Cancer of upper lobe of left lung [facility]) So far he tolerated chemo well I plan minimum 3 months of treatment before repeat imaging  MDS (myelodysplastic syndrome), low grade ([place]) He is not symptomatic from anemia He will get darbepoetin injection every 2 weeks The goal for darbepoetin injection is to keep hemoglobin greater than 10 He does not need blood transfusion today We will continue blood transfusion as needed to keep hemoglobin greater than 8 g He needs 1 unit of blood whenever hemoglobin is less than 8  Malignant cachexia ([place]) The patient is profoundly cachectic I suspect this is due to disease He will continue frequent small meals as tolerated  Tobacco abuse The patient has started to smoke again I reminded him what is at stake and recommend he is quit immediately   No orders of the defined types were placed in this encounter.   INTERVAL HISTORY: Please see below for problem oriented charting. He returns with his wife for further follow-up He feels well Denies side effects from treatment Denies recent infection, fever or chills He has chronic cough due to COPD, stable His weight is stable The patient denies any recent signs or symptoms of bleeding such as spontaneous epistaxis, hematuria or hematochezia.   SUMMARY OF ONCOLOGIC HISTORY: Oncology History   MDS, R-IPSS score of 3, low risk (Hg 8.9, +16 chromosome on BM, 2% blast count)   Squamous cell carcinoma of the epiglottis   Primary site: Larynx - Supraglottis (Left)   Staging method: AJCC 7th Edition   Clinical: Stage I (T1, N0, M0) signed by [name], MD on 04/30/2013 12:49 PM   Pathologic: Stage I (T1, N0, cM0) signed by [name], MD on 04/30/2013 12:49 PM   Summary: Stage I (T1, N0,  cM0)       MDS (myelodysplastic syndrome), low grade ([place])   02/01/2007 Bone Marrow Biopsy    BM biopsy was abnormal, overall probable low grade MDS      03/23/2011 - 02/06/2013 Chemotherapy    [name], discontinued due to diagnosis of laryngeal ca      09/06/2013 Bone Marrow Biopsy    Repeat bone marrow aspirate and biopsy confirmed low-grade myelodysplastic syndrome.      09/17/2013 -  Chemotherapy    The patient resumed darbepoetin injections to treat the anemia.       History of head and neck cancer   03/06/2013 Procedure    Biopsy from epiglottic region showed invasive [facility]      04/25/2013 Imaging    Ct scan showed no other involvement in the LN      05/13/2013 - 06/28/2013 Radiation Therapy    He received radiation therapy, 70 gray in 35 fractions to the larynx      06/09/2016 Imaging    CT scan of the chest showed Bilateral spiculated soft tissue pulmonary masses, highly suspicious for multifocal malignancy. Borderline enlarged left-sided mediastinal lymphadenopathy. Background of severe emphysematous changes, chronic cylindrical bronchiectasis and hyperinflation of the lungs. Findings consistent with longstanding COPD. Diffusely thickened interstitial markings. This may represent chronic interstitial lung changes, however lymphangitic spread of malignancy cannot be excluded. Anterior compression deformities of several of the thoracic vertebral bodies, with heterogeneous appearance of T7 and T9 vertebral body. This may represent degenerative changes, however osseous metastatic disease  cannot be excluded.       07/11/2016 PET scan    There are 2 nodules in the left upper lobe and 2 nodules within the right lower lobe which have spiculated margin and exhibit intense radiotracer uptake compatible with multifocal pulmonary metastasis versus multiple synchronous primary pulmonary neoplasms. 2. Emphysema 3. Aortic atherosclerosis and multi vessel coronary artery  calcification.       Cancer of upper lobe of left lung ([place])   06/09/2016 Imaging    CT chest: Bilateral spiculated soft tissue pulmonary masses, highly suspicious for multifocal malignancy. Borderline enlarged left-sided mediastinal lymphadenopathy. Background of severe emphysematous changes, chronic cylindrical bronchiectasis and hyperinflation of the lungs. Findings consistent with longstanding COPD. Diffusely thickened interstitial markings. This may represent chronic interstitial lung changes, however lymphangitic spread of malignancy cannot be excluded. Anterior compression deformities of several of the thoracic vertebral bodies, with heterogeneous appearance of T7 and T9 vertebral body. This may represent degenerative changes, however osseous metastatic disease cannot be excluded.       06/29/2016 Imaging    MRI brain: No evidence of metastatic disease or recent infarction. Advanced generalized brain atrophy with moderate to marked chronic small-vessel ischemic changes throughout. FLAIR imaging appears to show scattered gyri showing FLAIR hyperintensity. These abnormalities are not confirmed with restricted diffusion or contrast enhancement. Therefore, we are not certain that this is not artifactual. The differential diagnosis does include true pathology such as posterior reversible encephalopathy, viral or prion disease, postictal change in post chemotherapy change. If there is concern about active CNS pathology, re- scanning in 4-6 weeks could be useful.      07/27/2016 Imaging    CT chest: Bilateral pulmonary lesions. These are suspicious for metastatic disease. Lesions have minimally changed but there may be slight enlargement of the cavitary lesion in the right lower lobe. Severe emphysematous changes.       08/10/2016 Pathology Results    1. Lung, biopsy, RUL, (apical segment) - SCANT BENIGN LUNG PARENCHYMA. - THERE IS NO EVIDENCE OF MALIGNANCY. 2. Lung, biopsy, LUL lingula -  SQUAMOUS CELL CARCINOMA. - SEE COMMENT. 3. Lung, biopsy, LUL - ADENOCARCINOMA. - SEE COMMENT. Microscopic Comment 2. The malignant cells are positive for cytokeratin 56 and p63. They are negative for TTF-1. The findings are consistent with squamous cell carcinoma. There is likely insufficient tissue remaining for additional studies, if requested. 3. The malignant cells are positive for TTF-1 and negative for p63 and cytokeratin 5/6. The findings are consistent with adenocarcinoma.      08/10/2016 Procedure    The targets were defined as follows: Left upper lobe nodule, target 1 Lingular nodule, target 2 Proximal right lower lobe nodule, target 3 More distal right lower lobe, nodule  The extendable working channel was secured into place and the locator guide was withdrawn. Under fluoroscopic guidance transbronchial needle brushings, transbronchial Wang needle biopsies, and transbronchial forceps biopsies were performed in the LUL (target 1) and the lingula (target 2) to be sent for cytology and pathology. A bronchioalveolar lavage was performed in the lingula in the vicinity of target 2  and sent for cytology and microbiology (bacterial, fungal, AFB smears and cultures).  Fiducial markers were then placed around target 1, target 2, and in the right lower lobe in the vicinity of both target 3 and target 4 to be used for possible radiation therapy should this be indicated. At the end of the procedure a general airway inspection was performed and there was no evidence of active bleeding. The  bronchoscope was removed.  The patient tolerated the procedure well. There was no significant blood loss and there were no obvious complications. A post-procedural chest x-ray is pending.  Samples: 1. Transbronchial needle brushings from targets 1 and 2 2. Transbronchial Wang needle biopsies from targets 1 and 2 3. Transbronchial forceps biopsies from targets 1 and 2 4. Bronchoalveolar lavage from lingula,  target 2 5. Endobronchial biopsies from RUL apical segment 6. Endobronchial brushings from right upper lobe apical segment       08/10/2016 Genetic Testing    Patient has genetic testing done for [employer] and PD-L1 testing Results revealed PD-L1 testing is negative 0%      08/31/2016 Procedure    Technically successful right IJ power-injectable port catheter placement. Ready for routine use      09/21/2016 - 06/08/2017 Chemotherapy    The patient had chemotherapy with carboplatin and Taxol.      11/30/2016 PET scan    1. Interval resection of the hypermetabolic lingular nodule. 2. The 3 other spiculated, hypermetabolic nodules within the left upper and right lower lobes are slightly smaller with decreased metabolic activity, consistent with some response to interval therapy. 3. No progressive disease      03/06/2017 PET scan    1. Mixed appearance, with significant improvement in the left upper lobe nodule ; stable appearance of the more cephalad right lower lobe nodule; but enlargement and increased in metabolic activity of the more inferior right lower lobe nodule which appears cavitary. No new nodule. 2. Previously there was some moderate increase in diffuse marrow activity. That has increased further, and could be a response from prior granulocyte/marrow stimulation, although I cannot completely exclude the possibility that this is related to the patient's myelodysplastic syndrome. No CT correlate. 3. Other imaging findings of potential clinical significance: Aortic Atherosclerosis (ICD10-I70.0) and Emphysema (ICD10-J43.9). Coronary atherosclerosis. Low-density blood pool suggests anemia. Chronic right upper lobe scarring. Mucus in both mainstem bronchi. Suspected left renal cysts. Indistinct but likely enlarged prostate gland.      04/06/2017 - 04/18/2017 Radiation Therapy    He received SBRT treatment Radiation treatment dates:   04/06/17 - 04/18/17  Site/dose:   Right SBRT  Lung// 50 Gy in 5 fx  Beams/energy:   Photon // SRBT/ SBT-3D       06/26/2017 PET scan    1. Clear interval response to therapy of the 2 right lower lobe pulmonary nodules, each decreased in both size and hypermetabolism. 2. Stable size and hypermetabolic activity in the left upper lobe nodule. 3. Diffusely decreased FDG accumulation within the marrow space. 4. Symmetric uptake in both parotid glands is likely physiologic. Given the symmetry, this is most likely physiologic or inflammatory. 5. Prostatomegaly with marked bladder distention. Component of bladder outlet obstruction suspected. 6. Emphysema. (ICD10-J43.9) 7. Aortic Atherosclerois (ICD10-170.0)      09/25/2017 PET scan    1. New and enlarging small hypermetabolic nodules in the lingula are compatible with metastatic disease. These are in proximity to the linear scarring fiducial markers. 2. New hypermetabolic subcarinal nodal metastasis. 3. Left upper lobe index nodule is stable to slightly progressed in the interval in shows mild increase in hypermetabolism. 4. No substantial change in the 2 index right lower lobe nodules showing only minimal FDG uptake, as before. 5. Interval progression of patchy airspace opacity in both dependent lower lungs, compatible with infection/inflammation.      10/12/2017 -  Chemotherapy    The patient had atezolizumab for chemotherapy treatment.  REVIEW OF SYSTEMS:   Constitutional: Denies fevers, chills or abnormal weight loss Eyes: Denies blurriness of vision Ears, nose, mouth, throat, and face: Denies mucositis or sore throat Cardiovascular: Denies palpitation, chest discomfort or lower extremity swelling Gastrointestinal:  Denies nausea, heartburn or change in bowel habits Skin: Denies abnormal skin rashes Lymphatics: Denies new lymphadenopathy or easy bruising Neurological:Denies numbness, tingling or new weaknesses Behavioral/Psych: Mood is stable, no new changes  All other  systems were reviewed with the patient and are negative.  I have reviewed the past medical history, past surgical history, social history and family history with the patient and they are unchanged from previous note.  ALLERGIES:  is allergic to no known allergies.  MEDICATIONS:  Current Outpatient Medications  Medication Sig Dispense Refill  . albuterol (PROVENTIL HFA;VENTOLIN HFA) 108 (90 Base) MCG/ACT inhaler Inhale 2 puffs into the lungs every 4 (four) hours as needed for wheezing or shortness of breath. 1 Inhaler 5  . cyanocobalamin 500 MCG tablet Take 500 mcg by mouth daily.    . ferrous sulfate 325 (65 FE) MG tablet Take 325 mg by mouth daily with breakfast.    . folic acid (FOLVITE) 1 MG tablet Take 1 mg by mouth daily.    . Glycopyrrolate-Formoterol (BEVESPI AEROSPHERE) 9-4.8 MCG/ACT AERO Inhale 2 puffs into the lungs 2 (two) times daily. 2 Inhaler 0  . levETIRAcetam (KEPPRA) 500 MG tablet TAKE 1 TABLET BY MOUTH TWICE A DAY 180 tablet 3  . vitamin E 1000 UNIT capsule Take 1,000 Units by mouth daily.      No current facility-administered medications for this visit.     PHYSICAL EXAMINATION: ECOG PERFORMANCE STATUS: 1 - Symptomatic but completely ambulatory Temperature 98 Fahrenheit Blood pressure 110/76 heart rate 94 respiration rate 18 weight 100.1 pounds GENERAL:alert, no distress and comfortable.  He looks thin and cachectic SKIN: skin color, texture, turgor are normal, no rashes or significant lesions EYES: normal, Conjunctiva are pink and non-injected, sclera clear OROPHARYNX:no exudate, no erythema and lips, buccal mucosa, and tongue normal  NECK: supple, thyroid normal size, non-tender, without nodularity LYMPH:  no palpable lymphadenopathy in the cervical, axillary or inguinal LUNGS: clear to auscultation and percussion with normal breathing effort HEART: regular rate & rhythm and no murmurs and no lower extremity edema ABDOMEN:abdomen soft, non-tender and normal bowel  sounds Musculoskeletal:no cyanosis of digits with digital clubbing NEURO: alert & oriented x 3 with fluent speech, no focal motor/sensory deficits  LABORATORY DATA:  I have reviewed the data as listed    Component Value Date/Time   NA 141 11/01/2017 1023   NA 142 05/03/2017 1114   K 3.8 11/01/2017 1023   K 4.5 05/03/2017 1114   CL 109 11/01/2017 1023   CL 107 09/19/2012 1325   CO2 24 11/01/2017 1023   CO2 21 (L) 05/03/2017 1114   GLUCOSE 89 11/01/2017 1023   GLUCOSE 88 05/03/2017 1114   GLUCOSE 97 09/19/2012 1325   BUN 20 11/01/2017 1023   BUN 18.5 05/03/2017 1114   CREATININE 0.82 11/01/2017 1023   CREATININE 0.9 05/03/2017 1114   CALCIUM 8.7 (L) 11/01/2017 1023   CALCIUM 8.9 05/03/2017 1114   PROT 8.2 (H) 11/01/2017 1023   PROT 8.6 (H) 05/03/2017 1114   ALBUMIN 2.9 (L) 11/01/2017 1023   ALBUMIN 3.2 (L) 05/03/2017 1114   AST 8 (L) 11/01/2017 1023   AST 9 05/03/2017 1114   ALT <6 11/01/2017 1023   ALT <6 05/03/2017 1114   ALKPHOS 88 11/01/2017  1023   ALKPHOS 126 05/03/2017 1114   BILITOT 0.4 11/01/2017 1023   BILITOT 0.34 05/03/2017 1114   GFRNONAA >60 11/01/2017 1023   GFRAA >60 11/01/2017 1023    No results found for: SPEP, UPEP  Lab Results  Component Value Date   WBC 4.7 11/01/2017   NEUTROABS 3.6 11/01/2017   HGB 8.6 (L) 11/01/2017   HCT 27.2 (L) 11/01/2017   MCV 114.8 (H) 11/01/2017   PLT 252 11/01/2017      Chemistry      Component Value Date/Time   NA 141 11/01/2017 1023   NA 142 05/03/2017 1114   K 3.8 11/01/2017 1023   K 4.5 05/03/2017 1114   CL 109 11/01/2017 1023   CL 107 09/19/2012 1325   CO2 24 11/01/2017 1023   CO2 21 (L) 05/03/2017 1114   BUN 20 11/01/2017 1023   BUN 18.5 05/03/2017 1114   CREATININE 0.82 11/01/2017 1023   CREATININE 0.9 05/03/2017 1114      Component Value Date/Time   CALCIUM 8.7 (L) 11/01/2017 1023   CALCIUM 8.9 05/03/2017 1114   ALKPHOS 88 11/01/2017 1023   ALKPHOS 126 05/03/2017 1114   AST 8 (L) 11/01/2017  1023   AST 9 05/03/2017 1114   ALT <6 11/01/2017 1023   ALT <6 05/03/2017 1114   BILITOT 0.4 11/01/2017 1023   BILITOT 0.34 05/03/2017 1114       All questions were answered. The patient knows to call the clinic with any problems, questions or concerns. No barriers to learning was detected.  I spent 15 minutes counseling the patient face to face. The total time spent in the appointment was 20 minutes and more than 50% was on counseling and review of test results  [name], MD 11/02/2017 7:31 AM

## 2017-11-01 NOTE — Assessment & Plan Note (Signed)
He is not symptomatic from anemia He will get darbepoetin injection every 2 weeks The goal for darbepoetin injection is to keep hemoglobin greater than 10 He does not need blood transfusion today We will continue blood transfusion as needed to keep hemoglobin greater than 8 g He needs 1 unit of blood whenever hemoglobin is less than 8

## 2017-11-01 NOTE — Assessment & Plan Note (Signed)
So far he tolerated chemo well I plan minimum 3 months of treatment before repeat imaging

## 2017-11-01 NOTE — Assessment & Plan Note (Signed)
The patient has started to smoke again I reminded him what is at stake and recommend he is quit immediately

## 2017-11-08 ENCOUNTER — Inpatient Hospital Stay: Payer: Medicare Other

## 2017-11-08 ENCOUNTER — Inpatient Hospital Stay: Payer: Medicare Other | Attending: Hematology and Oncology

## 2017-11-08 VITALS — BP 124/70 | HR 95 | Temp 98.1°F | Resp 20

## 2017-11-08 DIAGNOSIS — R5383 Other fatigue: Secondary | ICD-10-CM | POA: Insufficient documentation

## 2017-11-08 DIAGNOSIS — R630 Anorexia: Secondary | ICD-10-CM | POA: Insufficient documentation

## 2017-11-08 DIAGNOSIS — Z5111 Encounter for antineoplastic chemotherapy: Secondary | ICD-10-CM | POA: Diagnosis not present

## 2017-11-08 DIAGNOSIS — Z79899 Other long term (current) drug therapy: Secondary | ICD-10-CM | POA: Diagnosis not present

## 2017-11-08 DIAGNOSIS — M1990 Unspecified osteoarthritis, unspecified site: Secondary | ICD-10-CM | POA: Diagnosis not present

## 2017-11-08 DIAGNOSIS — C801 Malignant (primary) neoplasm, unspecified: Secondary | ICD-10-CM | POA: Insufficient documentation

## 2017-11-08 DIAGNOSIS — T451X5S Adverse effect of antineoplastic and immunosuppressive drugs, sequela: Secondary | ICD-10-CM | POA: Insufficient documentation

## 2017-11-08 DIAGNOSIS — R64 Cachexia: Secondary | ICD-10-CM | POA: Insufficient documentation

## 2017-11-08 DIAGNOSIS — Z923 Personal history of irradiation: Secondary | ICD-10-CM | POA: Insufficient documentation

## 2017-11-08 DIAGNOSIS — R634 Abnormal weight loss: Secondary | ICD-10-CM | POA: Diagnosis not present

## 2017-11-08 DIAGNOSIS — D4621 Refractory anemia with excess of blasts 1: Secondary | ICD-10-CM | POA: Diagnosis not present

## 2017-11-08 DIAGNOSIS — G40909 Epilepsy, unspecified, not intractable, without status epilepticus: Secondary | ICD-10-CM | POA: Insufficient documentation

## 2017-11-08 DIAGNOSIS — D6481 Anemia due to antineoplastic chemotherapy: Secondary | ICD-10-CM | POA: Diagnosis not present

## 2017-11-08 DIAGNOSIS — C3412 Malignant neoplasm of upper lobe, left bronchus or lung: Secondary | ICD-10-CM

## 2017-11-08 DIAGNOSIS — Z87891 Personal history of nicotine dependence: Secondary | ICD-10-CM | POA: Diagnosis not present

## 2017-11-08 DIAGNOSIS — C7802 Secondary malignant neoplasm of left lung: Secondary | ICD-10-CM | POA: Insufficient documentation

## 2017-11-08 DIAGNOSIS — D4620 Refractory anemia with excess of blasts, unspecified: Secondary | ICD-10-CM

## 2017-11-08 DIAGNOSIS — B370 Candidal stomatitis: Secondary | ICD-10-CM | POA: Diagnosis not present

## 2017-11-08 DIAGNOSIS — Z9221 Personal history of antineoplastic chemotherapy: Secondary | ICD-10-CM | POA: Insufficient documentation

## 2017-11-08 DIAGNOSIS — R0609 Other forms of dyspnea: Secondary | ICD-10-CM | POA: Insufficient documentation

## 2017-11-08 DIAGNOSIS — D61818 Other pancytopenia: Secondary | ICD-10-CM | POA: Diagnosis not present

## 2017-11-08 LAB — CMP (CANCER CENTER ONLY)
ALBUMIN: 3 g/dL — AB (ref 3.5–5.0)
ALT: <6 U/L (ref 0–44)
ANION GAP: 7 (ref 5–15)
AST: 8 U/L — ABNORMAL LOW (ref 15–41)
Alkaline Phosphatase: 94 U/L (ref 38–126)
BUN: 20 mg/dL (ref 8–23)
CO2: 25 mmol/L (ref 22–32)
Calcium: 9 mg/dL (ref 8.9–10.3)
Chloride: 109 mmol/L (ref 98–111)
Creatinine: 0.85 mg/dL (ref 0.61–1.24)
GFR, Est AFR Am: >60 mL/min (ref >60)
GFR, Estimated: >60 mL/min (ref >60)
GLUCOSE: 88 mg/dL (ref 70–99)
Potassium: 3.9 mmol/L (ref 3.5–5.1)
SODIUM: 141 mmol/L (ref 135–145)
Total Bilirubin: 0.4 mg/dL (ref 0.3–1.2)
Total Protein: 8.5 g/dL — ABNORMAL HIGH (ref 6.5–8.1)

## 2017-11-08 LAB — CBC WITH DIFFERENTIAL (CANCER CENTER ONLY)
BASOS ABS: 0 10*3/uL (ref 0.0–0.1)
Basophils Relative: 1 %
Eosinophils Absolute: 0 10*3/uL (ref 0.0–0.5)
Eosinophils Relative: 1 %
HEMATOCRIT: 26.8 % — AB (ref 38.4–49.9)
HEMOGLOBIN: 9 g/dL — AB (ref 13.0–17.1)
LYMPHS PCT: 9 %
Lymphs Abs: 0.4 10*3/uL — ABNORMAL LOW (ref 0.9–3.3)
MCH: 38 pg — ABNORMAL HIGH (ref 27.2–33.4)
MCHC: 33.4 g/dL (ref 32.0–36.0)
MCV: 113.8 fL — ABNORMAL HIGH (ref 79.3–98.0)
MONO ABS: 0.4 10*3/uL (ref 0.1–0.9)
Monocytes Relative: 10 %
NEUTROS ABS: 3.4 10*3/uL (ref 1.5–6.5)
NEUTROS PCT: 79 %
Platelet Count: 259 10*3/uL (ref 140–400)
RBC: 2.36 MIL/uL — ABNORMAL LOW (ref 4.20–5.82)
RDW: 27.7 % — AB (ref 11.0–14.6)
WBC Count: 4.3 10*3/uL (ref 4.0–10.3)

## 2017-11-08 LAB — SAMPLE TO BLOOD BANK

## 2017-11-08 MED ORDER — PEGFILGRASTIM 6 MG/0.6ML ~~LOC~~ PSKT
PREFILLED_SYRINGE | SUBCUTANEOUS | Status: AC
Start: 2017-11-08 — End: ?
  Filled 2017-11-08: qty 0.6

## 2017-11-08 MED ORDER — DARBEPOETIN ALFA 500 MCG/ML IJ SOSY
PREFILLED_SYRINGE | INTRAMUSCULAR | Status: AC
Start: 2017-11-08 — End: ?
  Filled 2017-11-08: qty 1

## 2017-11-08 MED ORDER — DARBEPOETIN ALFA 500 MCG/ML IJ SOSY
500.0000 ug | PREFILLED_SYRINGE | Freq: Once | INTRAMUSCULAR | Status: AC
  Administered 2017-11-08: 500 ug via SUBCUTANEOUS

## 2017-11-08 NOTE — Patient Instructions (Signed)

## 2017-11-15 ENCOUNTER — Telehealth: Payer: Self-pay | Admitting: *Deleted

## 2017-11-15 ENCOUNTER — Other Ambulatory Visit: Payer: Self-pay | Admitting: Emergency Medicine

## 2017-11-15 ENCOUNTER — Inpatient Hospital Stay: Payer: Medicare Other

## 2017-11-15 ENCOUNTER — Inpatient Hospital Stay (HOSPITAL_BASED_OUTPATIENT_CLINIC_OR_DEPARTMENT_OTHER): Payer: Medicare Other | Admitting: Medical

## 2017-11-15 VITALS — BP 96/54 | HR 90 | Temp 98.0°F | Resp 17 | Ht 72.0 in | Wt 97.2 lb

## 2017-11-15 DIAGNOSIS — D4621 Refractory anemia with excess of blasts 1: Secondary | ICD-10-CM | POA: Diagnosis not present

## 2017-11-15 DIAGNOSIS — D6481 Anemia due to antineoplastic chemotherapy: Secondary | ICD-10-CM

## 2017-11-15 DIAGNOSIS — Z923 Personal history of irradiation: Secondary | ICD-10-CM

## 2017-11-15 DIAGNOSIS — R634 Abnormal weight loss: Secondary | ICD-10-CM

## 2017-11-15 DIAGNOSIS — C801 Malignant (primary) neoplasm, unspecified: Secondary | ICD-10-CM

## 2017-11-15 DIAGNOSIS — R630 Anorexia: Secondary | ICD-10-CM

## 2017-11-15 DIAGNOSIS — D4620 Refractory anemia with excess of blasts, unspecified: Secondary | ICD-10-CM

## 2017-11-15 DIAGNOSIS — C7802 Secondary malignant neoplasm of left lung: Secondary | ICD-10-CM

## 2017-11-15 DIAGNOSIS — T451X5A Adverse effect of antineoplastic and immunosuppressive drugs, initial encounter: Secondary | ICD-10-CM

## 2017-11-15 DIAGNOSIS — R5383 Other fatigue: Secondary | ICD-10-CM

## 2017-11-15 DIAGNOSIS — D61818 Other pancytopenia: Secondary | ICD-10-CM

## 2017-11-15 DIAGNOSIS — R0609 Other forms of dyspnea: Secondary | ICD-10-CM

## 2017-11-15 DIAGNOSIS — Z87891 Personal history of nicotine dependence: Secondary | ICD-10-CM

## 2017-11-15 DIAGNOSIS — G40909 Epilepsy, unspecified, not intractable, without status epilepticus: Secondary | ICD-10-CM

## 2017-11-15 DIAGNOSIS — D46Z Other myelodysplastic syndromes: Secondary | ICD-10-CM

## 2017-11-15 DIAGNOSIS — B370 Candidal stomatitis: Secondary | ICD-10-CM

## 2017-11-15 DIAGNOSIS — Z79899 Other long term (current) drug therapy: Secondary | ICD-10-CM

## 2017-11-15 DIAGNOSIS — Z9221 Personal history of antineoplastic chemotherapy: Secondary | ICD-10-CM

## 2017-11-15 DIAGNOSIS — T451X5S Adverse effect of antineoplastic and immunosuppressive drugs, sequela: Secondary | ICD-10-CM

## 2017-11-15 DIAGNOSIS — M1990 Unspecified osteoarthritis, unspecified site: Secondary | ICD-10-CM

## 2017-11-15 LAB — CMP (CANCER CENTER ONLY)
ALBUMIN: 2.8 g/dL — AB (ref 3.5–5.0)
ALT: <6 U/L (ref 0–44)
ANION GAP: 8 (ref 5–15)
AST: 8 U/L — AB (ref 15–41)
Alkaline Phosphatase: 83 U/L (ref 38–126)
BILIRUBIN TOTAL: 0.4 mg/dL (ref 0.3–1.2)
BUN: 24 mg/dL — AB (ref 8–23)
CHLORIDE: 108 mmol/L (ref 98–111)
CO2: 25 mmol/L (ref 22–32)
Calcium: 8.7 mg/dL — ABNORMAL LOW (ref 8.9–10.3)
Creatinine: 0.89 mg/dL (ref 0.61–1.24)
GFR, Est AFR Am: >60 mL/min (ref >60)
GFR, Estimated: >60 mL/min (ref >60)
GLUCOSE: 101 mg/dL — AB (ref 70–99)
POTASSIUM: 3.9 mmol/L (ref 3.5–5.1)
Sodium: 141 mmol/L (ref 135–145)
TOTAL PROTEIN: 8.5 g/dL — AB (ref 6.5–8.1)

## 2017-11-15 LAB — CBC WITH DIFFERENTIAL (CANCER CENTER ONLY)
BASOS ABS: 0 10*3/uL (ref 0.0–0.1)
Basophils Relative: 0 %
EOS PCT: 0 %
Eosinophils Absolute: 0 10*3/uL (ref 0.0–0.5)
HEMATOCRIT: 24.2 % — AB (ref 38.4–49.9)
Hemoglobin: 8.1 g/dL — ABNORMAL LOW (ref 13.0–17.1)
LYMPHS ABS: 0.3 10*3/uL — AB (ref 0.9–3.3)
LYMPHS PCT: 6 %
MCH: 38.4 pg — AB (ref 27.2–33.4)
MCHC: 33.4 g/dL (ref 32.0–36.0)
MCV: 114.8 fL — ABNORMAL HIGH (ref 79.3–98.0)
MONO ABS: 0.4 10*3/uL (ref 0.1–0.9)
Monocytes Relative: 9 %
NEUTROS ABS: 4.2 10*3/uL (ref 1.5–6.5)
Neutrophils Relative %: 85 %
PLATELETS: 298 10*3/uL (ref 140–400)
RBC: 2.1 MIL/uL — ABNORMAL LOW (ref 4.20–5.82)
RDW: 26.9 % — AB (ref 11.0–14.6)
WBC Count: 4.9 10*3/uL (ref 4.0–10.3)

## 2017-11-15 LAB — PREPARE RBC (CROSSMATCH)

## 2017-11-15 LAB — SAMPLE TO BLOOD BANK

## 2017-11-15 MED ORDER — FLUCONAZOLE 100 MG PO TABS: 100.0000 mg | ORAL_TABLET | Freq: Every day | ORAL | 0 refills | Status: DC

## 2017-11-15 MED ORDER — MIRTAZAPINE 15 MG PO TABS: 15.0000 mg | ORAL_TABLET | Freq: Every day | ORAL | 1 refills | Status: DC

## 2017-11-15 NOTE — Progress Notes (Signed)
These preliminary result these preliminary results were noted.  Awaiting final report.

## 2017-11-15 NOTE — Patient Instructions (Signed)
Anemia Anemia is a condition in which you do not have enough red blood cells or hemoglobin. Hemoglobin is a substance in red blood cells that carries oxygen. When you do not have enough red blood cells or hemoglobin (are anemic), your body cannot get enough oxygen and your organs may not work properly. As a result, you may feel very tired or have other problems. What are the causes? Common causes of anemia include:  Excessive bleeding. Anemia can be caused by excessive bleeding inside or outside the body, including bleeding from the intestine or from periods in women.  Poor nutrition.  Long-lasting (chronic) kidney, thyroid, and liver disease.  Bone marrow disorders.  Cancer and treatments for cancer.  HIV (human immunodeficiency virus) and AIDS (acquired immunodeficiency syndrome).  Treatments for HIV and AIDS.  Spleen problems.  Blood disorders.  Infections, medicines, and autoimmune disorders that destroy red blood cells.  What are the signs or symptoms? Symptoms of this condition include:  Minor weakness.  Dizziness.  Headache.  Feeling heartbeats that are irregular or faster than normal (palpitations).  Shortness of breath, especially with exercise.  Paleness.  Cold sensitivity.  Indigestion.  Nausea.  Difficulty sleeping.  Difficulty concentrating.  Symptoms may occur suddenly or develop slowly. If your anemia is mild, you may not have symptoms. How is this diagnosed? This condition is diagnosed based on:  Blood tests.  Your medical history.  A physical exam.  Bone marrow biopsy.  Your health care provider may also check your stool (feces) for blood and may do additional testing to look for the cause of your bleeding. You may also have other tests, including:  Imaging tests, such as a CT scan or MRI.  Endoscopy.  Colonoscopy.  How is this treated? Treatment for this condition depends on the cause. If you continue to lose a lot of blood,  you may need to be treated at a hospital. Treatment may include:  Taking supplements of iron, vitamin B12, or folic acid.  Taking a hormone medicine (erythropoietin) that can help to stimulate red blood cell growth.  Having a blood transfusion. This may be needed if you lose a lot of blood.  Making changes to your diet.  Having surgery to remove your spleen.  Follow these instructions at home:  Take over-the-counter and prescription medicines only as told by your health care provider.  Take supplements only as told by your health care provider.  Follow any diet instructions that you were given.  Keep all follow-up visits as told by your health care provider. This is important. Contact a health care provider if:  You develop new bleeding anywhere in the body. Get help right away if:  You are very weak.  You are short of breath.  You have pain in your abdomen or chest.  You are dizzy or feel faint.  You have trouble concentrating.  You have bloody or black, tarry stools.  You vomit repeatedly or you vomit up blood. Summary  Anemia is a condition in which you do not have enough red blood cells or enough of a substance in your red blood cells that carries oxygen (hemoglobin).  Symptoms may occur suddenly or develop slowly.  If your anemia is mild, you may not have symptoms.  This condition is diagnosed with blood tests as well as a medical history and physical exam. Other tests may be needed.  Treatment for this condition depends on the cause of the anemia. This information is not intended to replace advice   given to you by your health care provider. Make sure you discuss any questions you have with your health care provider. Document Released: 06/02/2004 Document Revised: 05/27/2016 Document Reviewed: 05/27/2016 Elsevier Interactive Patient Education  [employer].

## 2017-11-15 NOTE — Telephone Encounter (Signed)
Wife states patient has not been eating or drinking much, does not feel good. Also states he is SOB. Will bring in to see Sandi Mealy, PA. Order sent to pharmacy for Remeron 15 mg daily at bedtime. Dr Alvy Bimler has discussed using Remeron in the past with patient and wife.

## 2017-11-15 NOTE — Progress Notes (Signed)
Pt to receive 1 unit blood tomorrow in [facility] per PA Lucianne Lei for symptomatic anemia.

## 2017-11-16 ENCOUNTER — Inpatient Hospital Stay: Payer: Medicare Other

## 2017-11-16 DIAGNOSIS — D4620 Refractory anemia with excess of blasts, unspecified: Secondary | ICD-10-CM

## 2017-11-16 DIAGNOSIS — D6481 Anemia due to antineoplastic chemotherapy: Secondary | ICD-10-CM

## 2017-11-16 DIAGNOSIS — T451X5A Adverse effect of antineoplastic and immunosuppressive drugs, initial encounter: Principal | ICD-10-CM

## 2017-11-16 DIAGNOSIS — C7802 Secondary malignant neoplasm of left lung: Secondary | ICD-10-CM | POA: Diagnosis not present

## 2017-11-16 DIAGNOSIS — D61818 Other pancytopenia: Secondary | ICD-10-CM

## 2017-11-16 MED ORDER — DIPHENHYDRAMINE HCL 25 MG PO CAPS
25.0000 mg | ORAL_CAPSULE | Freq: Once | ORAL | Status: AC
  Administered 2017-11-16: 25 mg via ORAL

## 2017-11-16 MED ORDER — ACETAMINOPHEN 325 MG PO TABS
650.0000 mg | ORAL_TABLET | Freq: Once | ORAL | Status: AC
  Administered 2017-11-16: 650 mg via ORAL

## 2017-11-16 MED ORDER — DIPHENHYDRAMINE HCL 25 MG PO CAPS
ORAL_CAPSULE | ORAL | Status: AC
  Filled 2017-11-16: qty 1

## 2017-11-16 MED ORDER — SODIUM CHLORIDE 0.9% FLUSH
10.0000 mL | INTRAVENOUS | Status: AC | PRN
  Administered 2017-11-16: 10 mL
  Filled 2017-11-16: qty 10

## 2017-11-16 MED ORDER — SODIUM CHLORIDE 0.9 % IV SOLN
250.0000 mL | Freq: Once | INTRAVENOUS | Status: AC
  Administered 2017-11-16: 250 mL via INTRAVENOUS

## 2017-11-16 MED ORDER — HEPARIN SOD (PORK) LOCK FLUSH 100 UNIT/ML IV SOLN
500.0000 [IU] | Freq: Every day | INTRAVENOUS | Status: AC | PRN
  Administered 2017-11-16: 500 [IU]
  Filled 2017-11-16: qty 5

## 2017-11-16 MED ORDER — ACETAMINOPHEN 325 MG PO TABS
ORAL_TABLET | ORAL | Status: AC
  Filled 2017-11-16: qty 2

## 2017-11-16 NOTE — Progress Notes (Signed)
These preliminary result these preliminary results were noted.  Awaiting final report.

## 2017-11-16 NOTE — Patient Instructions (Signed)
Blood Transfusion A blood transfusion is a procedure in which you are given blood through an IV tube. You may need this procedure because of:  Illness.  Surgery.  Injury.  The blood may come from someone else (a donor). You may also be able to donate blood for yourself (autologous blood donation). The blood given in a transfusion is made up of different types of cells. You may get:  Red blood cells. These carry oxygen to the cells in the body.  White blood cells. These help you fight infections.  Platelets. These help your blood to clot.  Plasma. This is the liquid part of your blood. It helps with fluid imbalances.  If you have a clotting disorder, you may also get other types of blood products. What happens before the procedure?  You will have a blood test to find out your blood type. The test also finds out what type of blood your body will accept and matches it to the donor type.  If you are going to have a planned surgery, you may be able to donate your own blood. This may be done in case you need a transfusion.  If you have had an allergic reaction to a transfusion in the past, you may be given medicine to help prevent a reaction. This medicine may be given to you by mouth or through an IV.  You will have your temperature, blood pressure, and pulse checked.  Follow instructions from your doctor about what you cannot eat or drink.  Ask your doctor about: ? Changing or stopping your regular medicines. This is important if you take diabetes medicines or blood thinners. ? Taking medicines such as aspirin and ibuprofen. These medicines can thin your blood. Do not take these medicines before your procedure if your doctor tells you not to. What happens during the procedure?  An IV tube will be put into one of your veins.  The bag of donated blood will be attached to your IV tube. Then, the blood will enter through your vein.  Your temperature, blood pressure, and pulse will be  checked regularly during the procedure. This is done to find early signs of a transfusion reaction.  If you have any signs or symptoms of a reaction, your transfusion will be stopped. You may also be given medicine.  When the transfusion is done, your IV tube will be taken out.  Pressure may be applied to the IV site for a few minutes.  A bandage (dressing) will be put on the IV site. The procedure may vary among doctors and hospitals. What happens after the procedure?  Your temperature, blood pressure, heart rate, breathing rate, and blood oxygen level will be checked often.  Your blood may be tested to see how you are responding to the transfusion.  You may be warmed with fluids or blankets. This is done to keep the temperature of your body normal. Summary  A blood transfusion is a procedure in which you are given blood through an IV tube.  The blood may come from someone else (a donor). You may also be able to donate blood for yourself.  If you have had an allergic reaction to a transfusion in the past, you may be given medicine to help prevent a reaction. This medicine may be given to you by mouth or through an IV tube.  Your temperature, blood pressure, heart rate, breathing rate, and blood oxygen level will be checked often.  Your blood may be tested to   see how you are responding to the transfusion. This information is not intended to replace advice given to you by your health care provider. Make sure you discuss any questions you have with your health care provider. Document Released: 07/22/2008 Document Revised: 12/18/2015 Document Reviewed: 12/18/2015 Elsevier Interactive Patient Education  2017 [employer]  

## 2017-11-17 LAB — TYPE AND SCREEN
ABO/RH(D): O POS
ANTIBODY SCREEN: NEGATIVE
DONOR AG TYPE: NEGATIVE
Unit division: 0

## 2017-11-17 LAB — BPAM RBC
BLOOD PRODUCT EXPIRATION DATE: 201908082359
ISSUE DATE / TIME: 201907111051
UNIT TYPE AND RH: 5100

## 2017-11-17 NOTE — Progress Notes (Signed)
[facility] Progress Note   Adrian [name] [id number] 01-17-1951 67 y.o.  Adrian [name] is managed by Dr. Heath [name]  Actively treated with chemotherapy/immunotherapy: yes  Current Therapy: Atezolizumab  Last Treated: 11/01/2017 (cycle 2, day 1)  Assessment: Plan:    Adrian [name] (myelodysplastic syndrome), low grade (Adrian [name]) - Plan: Type and screen, Care order/instruction, Type and screen, BPAM RBC, DISCONTINUED: 0.9 %  sodium chloride [name], DISCONTINUED: heparin lock [name] 100 unit/mL, DISCONTINUED: acetaminophen (TYLENOL) tablet 650 mg, DISCONTINUED: diphenhydrAMINE (BENADRYL) capsule 25 mg, CANCELED: Practitioner attestation of consent, CANCELED: Complete patient signature process for consent form, CANCELED: Care order/instruction, CANCELED: Prepare RBC, CANCELED: Transfuse RBC, CANCELED: Prepare RBC  Metastasis to lung of unknown origin, left (Adrian [name]), Chronic  Seizure disorder (Adrian [name]), Chronic  Other pancytopenia (Adrian [name]) - Plan: Type and screen, Type and screen, DISCONTINUED: 0.9 %  sodium chloride [name], DISCONTINUED: heparin lock [name] 100 unit/mL, DISCONTINUED: acetaminophen (TYLENOL) tablet 650 mg, DISCONTINUED: diphenhydrAMINE (BENADRYL) capsule 25 mg, CANCELED: Practitioner attestation of consent, CANCELED: Complete patient signature process for consent form, CANCELED: Care order/instruction, CANCELED: Prepare RBC, CANCELED: Transfuse RBC, CANCELED: Prepare RBC  Anemia due to antineoplastic chemotherapy - Plan: Prepare RBC, Prepare RBC, BPAM RBC  [facility]rian [name] candidiasis - Plan: fluconazole (DIFLUCAN) 100 MG tablet  Please see After Visit Summary for patient specific instructions.  Future Appointments  Date Time Provider [name]  11/20/2017  9:15 AM Adrian [name] 6 Adrian [name] None  11/20/2017  9:45 AM Adrian [name] Adrian [name] None  11/20/2017 10:15 AM Adrian [name], Ni, MD Adrian [name] None  11/22/2017  8:45 AM Adrian [name] Adrian [name] None  12/06/2017   9:30 AM Adrian [name] 1 Adrian [name] None  12/06/2017 10:00 AM Adrian [name] Adrian [name] None  12/13/2017  9:15 AM Adrian [name] 4 Adrian [name] None  12/13/2017  9:45 AM Adrian [name] Adrian [name] None  12/13/2017 10:30 AM Adrian [name], Ni, MD Adrian [name] None  12/13/2017 11:30 AM Adrian [name] Adrian [name] None  12/20/2017  9:30 AM Adrian [name] 6 Adrian [name] None  12/20/2017 10:00 AM Adrian [name] Adrian [name] None  01/15/2018 10:30 AM Adrian [name], Adrian [place], MD Adrian [name] None    Orders Placed This Encounter  Procedures  . Type and screen  . Prepare RBC       Subjective:   Patient ID:  Adrian [name] is a 67 y.o. (DOB [birth year]) male.  Chief Complaint: No chief complaint on file.   HPI Adrian [name] is a 67 year old male with a history of myelodysplasia and and adenocarcinoma of the right upper lung who is treated by Dr. Heath [name] with Huey Bienenstock with his last treatment completed on 11/01/2017.  He presents to the clinic today with his wife with a report of anorexia, increasing fatigue, dyspnea on exertion and shortness of breath.  He reports that he is only sleeping around 4 to 5 hours/day.  He continues to lose weight and is 97.2 pounds today.  He denies chest pain, chest tightness, dizziness, headaches, nausea, vomiting, diarrhea, constipation, fevers, chills, [name] sweats.  He has been given a prescription for Remeron in hopes of stimulating his appetite.  He is yet to pick this prescription up however.    Medications: I have reviewed the patient's current medications.  Allergies:  Allergies  Allergen Reactions  . No Known Allergies     Past Medical History:  Diagnosis Date  . Anxiety   . Arthritis    rheumatoid  . Cancer (Adrian [name]) 03/11/2013   larnyx/epiglottic Squamous cell in situ  . Emphysema of lung (Adrian [name])   .  Esophageal reflux    not current  . History of blood transfusion   . History of radiation therapy 05/13/2013-06/28/2013   68 gray to epiglottis/neck  .  History of radiation therapy 04/06/17-04/18/17   right SBRT lung 50 Gy in 5 fractions  . Megaloblastic anemia   . Pneumonia 01/2013  . Seizures (Adrian [name])    last one was 8 years ago- 2008  . Shortness of breath    with exertion  . Throat pain 06/13/2013    Past Surgical History:  Procedure Laterality Date  . ABDOMINAL HERNIA REPAIR    . COLONOSCOPY    . IR FLUORO GUIDE PORT INSERTION RIGHT  08/31/2016  . IR US GUIDE VASC ACCESS RIGHT  08/31/2016  . MINOR PLACEMENT OF FIDUCIAL  08/10/2016   Procedure: MINOR PLACEMENT OF FIDUCIAL x3;  Surgeon: Collene Gobble, MD;  Location: [name];  Service: Thoracic;;  . MULTIPLE EXTRACTIONS WITH ALVEOLOPLASTY N/A 04/16/2013   Procedure: Extraction of tooth #'s 2,5,6,7,10,11,15,17,21,22,27,29 with alveoloplasty, bilateral maxillary fibrous tuberosity reductions, removal of excess tissues of mandibular arch, and mandibular left lingual exostoses reductions.;  Surgeon: Lenn Cal, DDS;  Location: [name];  Service: [name] Surgery;  Laterality: N/A;  . PEG PLACEMENT  2014  . tracheostomy  2014  . VIDEO BRONCHOSCOPY WITH ENDOBRONCHIAL NAVIGATION N/A 08/10/2016   Procedure: VIDEO BRONCHOSCOPY WITH ENDOBRONCHIAL NAVIGATION;  Surgeon: Collene Gobble, MD;  Location: [name];  Service: Thoracic;  Laterality: N/A;    Family History  Problem Relation Age of Onset  . Hypertension Mother   . Arthritis/Rheumatoid Mother   . Stroke Mother     Social History   Socioeconomic History  . Marital status: Married    Spouse name: Adrian [name]  . Number of children: 3  . Years of education: 12th  . Highest education level: Not on file  Occupational History  . Occupation: Retired    Fish farm manager: [employer]  Social Needs  . Financial resource strain: Not on file  . Food insecurity:    Worry: Not on file    Inability: Not on file  . Transportation needs:    Medical: Not on file    Non-medical: Not on file  Tobacco Use  . Smoking status: Former Smoker    Packs/day: 0.12    Years:  40.00    Pack years: 4.80    Types: Cigarettes    Last attempt to quit: 10/04/2016    Years since quitting: 1.1  . Smokeless tobacco: Never Used  . Tobacco comment: smoke 1 to 2 a day  Substance and Sexual Activity  . Alcohol use: No    Alcohol/week: 0.0 oz  . Drug use: No  . Sexual activity: Not on file  Lifestyle  . Physical activity:    Days per week: Not on file    Minutes per session: Not on file  . Stress: Not on file  Relationships  . Social connections:    Talks on phone: Not on file    Gets together: Not on file    Attends religious service: Not on file    Active member of club [name] organization: Not on file    Attends meetings of clubs [name] organizations: Not on file    Relationship status: Not on file  . Intimate partner violence:    Fear of current [name] ex partner: Not on file    Emotionally abused: Not on file    Physically abused: Not on file    Forced sexual activity: Not  on file  Other Topics Concern  . Not on file  Social History Narrative   04/12/2013   The patient is married with 3 children. (2 girls and one boy)    The patient has a history of smoking a quarter pack per day for 15 years.   The patient is trying to quit on his own.   Patient has not had any alcohol for the past 6 months by report. Patient usually drinks vodka.   Caffeine Use: 2 cups daily             Past Medical History, Surgical history, Social history, and Family history were reviewed and updated as appropriate.   Please see review of systems for further details on the patient's review from today.   Review of Systems:  Review of Systems  Constitutional: Positive for appetite change and unexpected weight change. Negative for chills, diaphoresis and fever.  HENT: Negative for trouble swallowing.   Respiratory: Positive for shortness of breath. Negative for cough and chest tightness.   Cardiovascular: Negative for chest pain and palpitations.  Gastrointestinal: Negative for abdominal  pain, constipation, diarrhea, nausea and vomiting.  Neurological: Positive for weakness. Negative for dizziness and headaches.    Objective:   Physical Exam:  BP (!) 96/54 (BP Location: Left Arm, Patient Position: Sitting)   Pulse 90   Temp 98 F (36.7 C) ([name])   Resp 17   Ht 6' (1.829 m)   Wt 97 lb 3.2 oz (44.1 kg)   SpO2 99%   BMI 13.18 kg/m  ECOG: 1  Physical Exam  Constitutional: No distress.  The patient is a thin adult male who appears older than his stated age and appears to be chronically ill.  HENT:  Head: Normocephalic and atraumatic.  Whitish plaquing of the oropharynx and tongue.  The whitish plaque can be scraped away from the tongue.  Cardiovascular: Normal rate, regular rhythm and normal heart sounds. Exam reveals no gallop and no friction rub.  No murmur heard. Pulmonary/Chest: Effort normal and breath sounds normal. No stridor. No respiratory distress. He has no wheezes. He has no rales.  Abdominal: Soft. Bowel sounds are normal. He exhibits no distension and no mass. There is no tenderness. There is no rebound and no guarding.  Neurological: He is alert.  Skin: Skin is warm and dry. He is not diaphoretic.  Clubbing of the fingernails is noted    [name] Review:     Component Value Date/Time   NA 141 11/15/2017 1338   NA 142 05/03/2017 1114   K 3.9 11/15/2017 1338   K 4.5 05/03/2017 1114   CL 108 11/15/2017 1338   CL 107 09/19/2012 1325   CO2 25 11/15/2017 1338   CO2 21 (L) 05/03/2017 1114   GLUCOSE 101 (H) 11/15/2017 1338   GLUCOSE 88 05/03/2017 1114   GLUCOSE 97 09/19/2012 1325   BUN 24 (H) 11/15/2017 1338   BUN 18.5 05/03/2017 1114   CREATININE 0.89 11/15/2017 1338   CREATININE 0.9 05/03/2017 1114   CALCIUM 8.7 (L) 11/15/2017 1338   CALCIUM 8.9 05/03/2017 1114   PROT 8.5 (H) 11/15/2017 1338   PROT 8.6 (H) 05/03/2017 1114   ALBUMIN 2.8 (L) 11/15/2017 1338   ALBUMIN 3.2 (L) 05/03/2017 1114   AST 8 (L) 11/15/2017 1338   AST 9 05/03/2017 1114    ALT <6 11/15/2017 1338   ALT <6 05/03/2017 1114   ALKPHOS 83 11/15/2017 1338   ALKPHOS 126 05/03/2017 1114   BILITOT  0.4 11/15/2017 1338   BILITOT 0.34 05/03/2017 1114   GFRNONAA >60 11/15/2017 1338   GFRAA >60 11/15/2017 1338       Component Value Date/Time   WBC 4.9 11/15/2017 1338   WBC 5.2 09/27/2017 0859   RBC 2.10 (L) 11/15/2017 1338   HGB 8.1 (L) 11/15/2017 1338   HGB 8.7 (L) 05/03/2017 1114   HCT 24.2 (L) 11/15/2017 1338   HCT 26.6 (L) 05/03/2017 1114   PLT 298 11/15/2017 1338   PLT 112 (L) 05/03/2017 1114   MCV 114.8 (H) 11/15/2017 1338   MCV 93.2 05/03/2017 1114   MCH 38.4 (H) 11/15/2017 1338   MCHC 33.4 11/15/2017 1338   RDW 26.9 (H) 11/15/2017 1338   RDW 17.2 (H) 05/03/2017 1114   LYMPHSABS 0.3 (L) 11/15/2017 1338   LYMPHSABS 0.6 (L) 05/03/2017 1114   MONOABS 0.4 11/15/2017 1338   MONOABS 1.0 (H) 05/03/2017 1114   EOSABS 0.0 11/15/2017 1338   EOSABS 0.1 05/03/2017 1114   BASOSABS 0.0 11/15/2017 1338   BASOSABS 0.0 05/03/2017 1114   -------------------------------  Imaging from last 24 hours (if applicable):  Radiology interpretation: No results found.

## 2017-11-20 ENCOUNTER — Inpatient Hospital Stay: Payer: Medicare Other

## 2017-11-20 ENCOUNTER — Inpatient Hospital Stay (HOSPITAL_BASED_OUTPATIENT_CLINIC_OR_DEPARTMENT_OTHER): Payer: Medicare Other | Admitting: Hematology and Oncology

## 2017-11-20 ENCOUNTER — Encounter: Payer: Self-pay | Admitting: Hematology and Oncology

## 2017-11-20 DIAGNOSIS — R5383 Other fatigue: Secondary | ICD-10-CM

## 2017-11-20 DIAGNOSIS — C3412 Malignant neoplasm of upper lobe, left bronchus or lung: Secondary | ICD-10-CM

## 2017-11-20 DIAGNOSIS — D61818 Other pancytopenia: Secondary | ICD-10-CM

## 2017-11-20 DIAGNOSIS — Z79899 Other long term (current) drug therapy: Secondary | ICD-10-CM

## 2017-11-20 DIAGNOSIS — R0609 Other forms of dyspnea: Secondary | ICD-10-CM

## 2017-11-20 DIAGNOSIS — C801 Malignant (primary) neoplasm, unspecified: Secondary | ICD-10-CM | POA: Diagnosis not present

## 2017-11-20 DIAGNOSIS — Z9221 Personal history of antineoplastic chemotherapy: Secondary | ICD-10-CM

## 2017-11-20 DIAGNOSIS — D4621 Refractory anemia with excess of blasts 1: Secondary | ICD-10-CM

## 2017-11-20 DIAGNOSIS — Z87891 Personal history of nicotine dependence: Secondary | ICD-10-CM

## 2017-11-20 DIAGNOSIS — M1990 Unspecified osteoarthritis, unspecified site: Secondary | ICD-10-CM

## 2017-11-20 DIAGNOSIS — C7802 Secondary malignant neoplasm of left lung: Secondary | ICD-10-CM

## 2017-11-20 DIAGNOSIS — R634 Abnormal weight loss: Secondary | ICD-10-CM

## 2017-11-20 DIAGNOSIS — B370 Candidal stomatitis: Secondary | ICD-10-CM

## 2017-11-20 DIAGNOSIS — R630 Anorexia: Secondary | ICD-10-CM

## 2017-11-20 DIAGNOSIS — R64 Cachexia: Secondary | ICD-10-CM

## 2017-11-20 DIAGNOSIS — Z923 Personal history of irradiation: Secondary | ICD-10-CM

## 2017-11-20 DIAGNOSIS — D4620 Refractory anemia with excess of blasts, unspecified: Secondary | ICD-10-CM

## 2017-11-20 DIAGNOSIS — D46Z Other myelodysplastic syndromes: Secondary | ICD-10-CM

## 2017-11-20 DIAGNOSIS — D6481 Anemia due to antineoplastic chemotherapy: Secondary | ICD-10-CM

## 2017-11-20 DIAGNOSIS — G40909 Epilepsy, unspecified, not intractable, without status epilepticus: Secondary | ICD-10-CM

## 2017-11-20 DIAGNOSIS — T451X5S Adverse effect of antineoplastic and immunosuppressive drugs, sequela: Secondary | ICD-10-CM

## 2017-11-20 LAB — SAMPLE TO BLOOD BANK

## 2017-11-20 LAB — CMP (CANCER CENTER ONLY)
ALK PHOS: 85 U/L (ref 38–126)
ALT: 7 U/L (ref 0–44)
AST: 12 U/L — AB (ref 15–41)
Albumin: 2.9 g/dL — ABNORMAL LOW (ref 3.5–5.0)
Anion gap: 10 (ref 5–15)
BUN: 28 mg/dL — ABNORMAL HIGH (ref 8–23)
CHLORIDE: 111 mmol/L (ref 98–111)
CO2: 23 mmol/L (ref 22–32)
CREATININE: 0.88 mg/dL (ref 0.61–1.24)
Calcium: 8.9 mg/dL (ref 8.9–10.3)
GFR, Est AFR Am: >60 mL/min (ref >60)
Glucose, Bld: 104 mg/dL — ABNORMAL HIGH (ref 70–99)
Potassium: 3.7 mmol/L (ref 3.5–5.1)
Sodium: 144 mmol/L (ref 135–145)
Total Bilirubin: 0.4 mg/dL (ref 0.3–1.2)
Total Protein: 9 g/dL — ABNORMAL HIGH (ref 6.5–8.1)

## 2017-11-20 LAB — CBC WITH DIFFERENTIAL (CANCER CENTER ONLY)
Basophils Absolute: 0 10*3/uL (ref 0.0–0.1)
Basophils Relative: 1 %
EOS ABS: 0 10*3/uL (ref 0.0–0.5)
EOS PCT: 0 %
HCT: 28.1 % — ABNORMAL LOW (ref 38.4–49.9)
Hemoglobin: 9.5 g/dL — ABNORMAL LOW (ref 13.0–17.1)
LYMPHS ABS: 0.3 10*3/uL — AB (ref 0.9–3.3)
Lymphocytes Relative: 8 %
MCH: 36.9 pg — AB (ref 27.2–33.4)
MCHC: 33.7 g/dL (ref 32.0–36.0)
MCV: 109.6 fL — ABNORMAL HIGH (ref 79.3–98.0)
MONOS PCT: 10 %
Monocytes Absolute: 0.4 10*3/uL (ref 0.1–0.9)
Neutro Abs: 3.3 10*3/uL (ref 1.5–6.5)
Neutrophils Relative %: 81 %
PLATELETS: 288 10*3/uL (ref 140–400)
RBC: 2.57 MIL/uL — ABNORMAL LOW (ref 4.20–5.82)
RDW: 27.8 % — ABNORMAL HIGH (ref 11.0–14.6)
WBC Count: 4.1 10*3/uL (ref 4.0–10.3)

## 2017-11-20 LAB — TSH: TSH: 0.838 u[IU]/mL (ref 0.320–4.118)

## 2017-11-20 MED ORDER — SODIUM CHLORIDE 0.9% FLUSH
10.0000 mL | Freq: Once | INTRAVENOUS | Status: AC
  Administered 2017-11-20: 10 mL
  Filled 2017-11-20: qty 10

## 2017-11-20 NOTE — Assessment & Plan Note (Signed)
So far he tolerated chemo well I plan minimum 3 months of treatment before repeat imaging

## 2017-11-20 NOTE — Assessment & Plan Note (Signed)
He is not symptomatic from anemia He will get darbepoetin injection every 2 weeks The goal for darbepoetin injection is to keep hemoglobin greater than 10 He does not need blood transfusion today We will continue blood transfusion as needed to keep hemoglobin greater than 8 g He needs 1 unit of blood whenever hemoglobin is less than 8

## 2017-11-20 NOTE — Assessment & Plan Note (Signed)
The patient is profoundly cachectic I suspect this is due to disease He will continue frequent small meals as tolerated The patient is also on Remeron His wife claim the patient has dysphagia but the patient declines such thing He had recent thrush.  After resolution of thrush, he denies problem with swallowing

## 2017-11-20 NOTE — Progress Notes (Signed)
[place] OFFICE PROGRESS NOTE  Patient Care Team: [name], MD as PCP - General (Family Medicine) [name], MD as Consulting Physician (Hematology and Oncology)  ASSESSMENT & PLAN:  Cancer of upper lobe of left lung [facility]) So far he tolerated chemo well I plan minimum 3 months of treatment before repeat imaging  Malignant cachexia [facility]) The patient is profoundly cachectic I suspect this is due to disease He will continue frequent small meals as tolerated The patient is also on Remeron His wife claim the patient has dysphagia but the patient declines such thing He had recent thrush.  After resolution of thrush, he denies problem with swallowing  MDS (myelodysplastic syndrome), low grade ([place]) He is not symptomatic from anemia He will get darbepoetin injection every 2 weeks The goal for darbepoetin injection is to keep hemoglobin greater than 10 He does not need blood transfusion today We will continue blood transfusion as needed to keep hemoglobin greater than 8 g He needs 1 unit of blood whenever hemoglobin is less than 8  Seizure disorder He denies recent seizures He is on Keppra His wife noted that the patient has some changes in personality I do not believe that this is related to [place] or his treatment   No orders of the defined types were placed in this encounter.   INTERVAL HISTORY: Please see below for problem oriented charting. He returns with his wife for further follow-up The patient looks cachectic He was recently started on fluconazole for thrush but that has since resolved He claimed that his appetite is stable His wife thought that the patient has complained of difficulties with swallowing with throat pain When I questioned the patient directly today, he declined those symptoms He continues to have minimum cough His wife stated that the patient is occasionally mean to her and is wondering whether this could be due to side effects  of treatment He also have dry skin He has smoked intermittently  SUMMARY OF ONCOLOGIC HISTORY: Oncology History   MDS, R-IPSS score of 3, low risk (Hg 8.9, +16 chromosome on BM, 2% blast count)   Squamous cell carcinoma of the epiglottis   Primary site: Larynx - Supraglottis (Left)   Staging method: AJCC 7th Edition   Clinical: Stage I (T1, N0, M0) signed by [name], MD on 04/30/2013 12:49 PM   Pathologic: Stage I (T1, N0, cM0) signed by [name], MD on 04/30/2013 12:49 PM   Summary: Stage I (T1, N0, cM0)       MDS (myelodysplastic syndrome), low grade ([place])   02/01/2007 Bone Marrow Biopsy    BM biopsy was abnormal, overall probable low grade MDS      03/23/2011 - 02/06/2013 Chemotherapy    [name], discontinued due to diagnosis of laryngeal ca      09/06/2013 Bone Marrow Biopsy    Repeat bone marrow aspirate and biopsy confirmed low-grade myelodysplastic syndrome.      09/17/2013 -  Chemotherapy    The patient resumed darbepoetin injections to treat the anemia.       History of head and neck cancer   03/06/2013 Procedure    Biopsy from epiglottic region showed invasive [facility]      04/25/2013 Imaging    Ct scan showed no other involvement in the LN      05/13/2013 - 06/28/2013 Radiation Therapy    He received radiation therapy, 70 gray in 35 fractions to the larynx      06/09/2016 Imaging  CT scan of the chest showed Bilateral spiculated soft tissue pulmonary masses, highly suspicious for multifocal malignancy. Borderline enlarged left-sided mediastinal lymphadenopathy. Background of severe emphysematous changes, chronic cylindrical bronchiectasis and hyperinflation of the lungs. Findings consistent with longstanding COPD. Diffusely thickened interstitial markings. This may represent chronic interstitial lung changes, however lymphangitic spread of malignancy cannot be excluded. Anterior compression deformities of several of the thoracic vertebral bodies,  with heterogeneous appearance of T7 and T9 vertebral body. This may represent degenerative changes, however osseous metastatic disease cannot be excluded.       07/11/2016 PET scan    There are 2 nodules in the left upper lobe and 2 nodules within the right lower lobe which have spiculated margin and exhibit intense radiotracer uptake compatible with multifocal pulmonary metastasis versus multiple synchronous primary pulmonary neoplasms. 2. Emphysema 3. Aortic atherosclerosis and multi vessel coronary artery calcification.       Cancer of upper lobe of left lung ([place])   06/09/2016 Imaging    CT chest: Bilateral spiculated soft tissue pulmonary masses, highly suspicious for multifocal malignancy. Borderline enlarged left-sided mediastinal lymphadenopathy. Background of severe emphysematous changes, chronic cylindrical bronchiectasis and hyperinflation of the lungs. Findings consistent with longstanding COPD. Diffusely thickened interstitial markings. This may represent chronic interstitial lung changes, however lymphangitic spread of malignancy cannot be excluded. Anterior compression deformities of several of the thoracic vertebral bodies, with heterogeneous appearance of T7 and T9 vertebral body. This may represent degenerative changes, however osseous metastatic disease cannot be excluded.       06/29/2016 Imaging    MRI brain: No evidence of metastatic disease or recent infarction. Advanced generalized brain atrophy with moderate to marked chronic small-vessel ischemic changes throughout. FLAIR imaging appears to show scattered gyri showing FLAIR hyperintensity. These abnormalities are not confirmed with restricted diffusion or contrast enhancement. Therefore, we are not certain that this is not artifactual. The differential diagnosis does include true pathology such as posterior reversible encephalopathy, viral or prion disease, postictal change in post chemotherapy change. If there is concern  about active CNS pathology, re- scanning in 4-6 weeks could be useful.      07/27/2016 Imaging    CT chest: Bilateral pulmonary lesions. These are suspicious for metastatic disease. Lesions have minimally changed but there may be slight enlargement of the cavitary lesion in the right lower lobe. Severe emphysematous changes.       08/10/2016 Pathology Results    1. Lung, biopsy, RUL, (apical segment) - SCANT BENIGN LUNG PARENCHYMA. - THERE IS NO EVIDENCE OF MALIGNANCY. 2. Lung, biopsy, LUL lingula - SQUAMOUS CELL CARCINOMA. - SEE COMMENT. 3. Lung, biopsy, LUL - ADENOCARCINOMA. - SEE COMMENT. Microscopic Comment 2. The malignant cells are positive for cytokeratin 56 and p63. They are negative for TTF-1. The findings are consistent with squamous cell carcinoma. There is likely insufficient tissue remaining for additional studies, if requested. 3. The malignant cells are positive for TTF-1 and negative for p63 and cytokeratin 5/6. The findings are consistent with adenocarcinoma.      08/10/2016 Procedure    The targets were defined as follows: Left upper lobe nodule, target 1 Lingular nodule, target 2 Proximal right lower lobe nodule, target 3 More distal right lower lobe, nodule  The extendable working channel was secured into place and the locator guide was withdrawn. Under fluoroscopic guidance transbronchial needle brushings, transbronchial Wang needle biopsies, and transbronchial forceps biopsies were performed in the LUL (target 1) and the lingula (target 2) to be sent for cytology and  pathology. A bronchioalveolar lavage was performed in the lingula in the vicinity of target 2  and sent for cytology and microbiology (bacterial, fungal, AFB smears and cultures).  Fiducial markers were then placed around target 1, target 2, and in the right lower lobe in the vicinity of both target 3 and target 4 to be used for possible radiation therapy should this be indicated. At the end of the  procedure a general airway inspection was performed and there was no evidence of active bleeding. The bronchoscope was removed.  The patient tolerated the procedure well. There was no significant blood loss and there were no obvious complications. A post-procedural chest x-ray is pending.  Samples: 1. Transbronchial needle brushings from targets 1 and 2 2. Transbronchial Wang needle biopsies from targets 1 and 2 3. Transbronchial forceps biopsies from targets 1 and 2 4. Bronchoalveolar lavage from lingula, target 2 5. Endobronchial biopsies from RUL apical segment 6. Endobronchial brushings from right upper lobe apical segment       08/10/2016 Genetic Testing    Patient has genetic testing done for [employer] and PD-L1 testing Results revealed PD-L1 testing is negative 0%      08/31/2016 Procedure    Technically successful right IJ power-injectable port catheter placement. Ready for routine use      09/21/2016 - 06/08/2017 Chemotherapy    The patient had chemotherapy with carboplatin and Taxol.      11/30/2016 PET scan    1. Interval resection of the hypermetabolic lingular nodule. 2. The 3 other spiculated, hypermetabolic nodules within the left upper and right lower lobes are slightly smaller with decreased metabolic activity, consistent with some response to interval therapy. 3. No progressive disease      03/06/2017 PET scan    1. Mixed appearance, with significant improvement in the left upper lobe nodule ; stable appearance of the more cephalad right lower lobe nodule; but enlargement and increased in metabolic activity of the more inferior right lower lobe nodule which appears cavitary. No new nodule. 2. Previously there was some moderate increase in diffuse marrow activity. That has increased further, and could be a response from prior granulocyte/marrow stimulation, although I cannot completely exclude the possibility that this is related to the patient's myelodysplastic  syndrome. No CT correlate. 3. Other imaging findings of potential clinical significance: Aortic Atherosclerosis (ICD10-I70.0) and Emphysema (ICD10-J43.9). Coronary atherosclerosis. Low-density blood pool suggests anemia. Chronic right upper lobe scarring. Mucus in both mainstem bronchi. Suspected left renal cysts. Indistinct but likely enlarged prostate gland.      04/06/2017 - 04/18/2017 Radiation Therapy    He received SBRT treatment Radiation treatment dates:   04/06/17 - 04/18/17  Site/dose:   Right SBRT Lung// 50 Gy in 5 fx  Beams/energy:   Photon // SRBT/ SBT-3D       06/26/2017 PET scan    1. Clear interval response to therapy of the 2 right lower lobe pulmonary nodules, each decreased in both size and hypermetabolism. 2. Stable size and hypermetabolic activity in the left upper lobe nodule. 3. Diffusely decreased FDG accumulation within the marrow space. 4. Symmetric uptake in both parotid glands is likely physiologic. Given the symmetry, this is most likely physiologic or inflammatory. 5. Prostatomegaly with marked bladder distention. Component of bladder outlet obstruction suspected. 6. Emphysema. (ICD10-J43.9) 7. Aortic Atherosclerois (ICD10-170.0)      09/25/2017 PET scan    1. New and enlarging small hypermetabolic nodules in the lingula are compatible with metastatic disease. These are in  proximity to the linear scarring fiducial markers. 2. New hypermetabolic subcarinal nodal metastasis. 3. Left upper lobe index nodule is stable to slightly progressed in the interval in shows mild increase in hypermetabolism. 4. No substantial change in the 2 index right lower lobe nodules showing only minimal FDG uptake, as before. 5. Interval progression of patchy airspace opacity in both dependent lower lungs, compatible with infection/inflammation.      10/12/2017 -  Chemotherapy    The patient had atezolizumab for chemotherapy treatment.        REVIEW OF SYSTEMS:    Constitutional: Denies fevers, chills  Eyes: Denies blurriness of vision Ears, nose, mouth, throat, and face: Denies mucositis or sore throat Respiratory: Denies cough, dyspnea or wheezes Cardiovascular: Denies palpitation, chest discomfort or lower extremity swelling Gastrointestinal:  Denies nausea, heartburn or change in bowel habits Lymphatics: Denies new lymphadenopathy or easy bruising Neurological:Denies numbness, tingling or new weaknesses All other systems were reviewed with the patient and are negative.  I have reviewed the past medical history, past surgical history, social history and family history with the patient and they are unchanged from previous note.  ALLERGIES:  is allergic to no known allergies.  MEDICATIONS:  Current Outpatient Medications  Medication Sig Dispense Refill  . albuterol (PROVENTIL HFA;VENTOLIN HFA) 108 (90 Base) MCG/ACT inhaler Inhale 2 puffs into the lungs every 4 (four) hours as needed for wheezing or shortness of breath. (Patient not taking: Reported on 11/15/2017) 1 Inhaler 5  . cyanocobalamin 500 MCG tablet Take 500 mcg by mouth daily.    . ferrous sulfate 325 (65 FE) MG tablet Take 325 mg by mouth daily with breakfast.    . folic acid (FOLVITE) 1 MG tablet Take 1 mg by mouth daily.    . Glycopyrrolate-Formoterol (BEVESPI AEROSPHERE) 9-4.8 MCG/ACT AERO Inhale 2 puffs into the lungs 2 (two) times daily. 2 Inhaler 0  . levETIRAcetam (KEPPRA) 500 MG tablet TAKE 1 TABLET BY MOUTH TWICE A DAY 180 tablet 3  . mirtazapine (REMERON) 15 MG tablet Take 1 tablet (15 mg total) by mouth at bedtime. 30 tablet 1  . vitamin E 1000 UNIT capsule Take 1,000 Units by mouth daily.      No current facility-administered medications for this visit.     PHYSICAL EXAMINATION: ECOG PERFORMANCE STATUS: 2 - Symptomatic, <50% confined to bed  Vitals:   11/20/17 1010  BP: 107/70  Pulse: (!) 108  Resp: 18  Temp: (!) 97.5 F (36.4 C)  SpO2: 95%   Filed Weights    11/20/17 1010  Weight: 97 lb 8 oz (44.2 kg)    GENERAL:alert, no distress and comfortable.  He looks thin and cachectic SKIN: skin color, texture, turgor are normal, no rashes or significant lesions EYES: normal, Conjunctiva are pink and non-injected, sclera clear OROPHARYNX:no exudate, no erythema and lips, buccal mucosa, and tongue normal  NECK: supple, thyroid normal size, non-tender, without nodularity LYMPH:  no palpable lymphadenopathy in the cervical, axillary or inguinal LUNGS: clear to auscultation and percussion with normal breathing effort HEART: regular rate & rhythm and no murmurs and no lower extremity edema ABDOMEN:abdomen soft, non-tender and normal bowel sounds Musculoskeletal:no cyanosis of digits with chronic digital clubbing  NEURO: alert & oriented x 3 with fluent speech, no focal motor/sensory deficits  LABORATORY DATA:  I have reviewed the data as listed    Component Value Date/Time   NA 144 11/20/2017 0937   NA 142 05/03/2017 1114   K 3.7 11/20/2017 3570  K 4.5 05/03/2017 1114   CL 111 11/20/2017 0937   CL 107 09/19/2012 1325   CO2 23 11/20/2017 0937   CO2 21 (L) 05/03/2017 1114   GLUCOSE 104 (H) 11/20/2017 0937   GLUCOSE 88 05/03/2017 1114   GLUCOSE 97 09/19/2012 1325   BUN 28 (H) 11/20/2017 0937   BUN 18.5 05/03/2017 1114   CREATININE 0.88 11/20/2017 0937   CREATININE 0.9 05/03/2017 1114   CALCIUM 8.9 11/20/2017 0937   CALCIUM 8.9 05/03/2017 1114   PROT 9.0 (H) 11/20/2017 0937   PROT 8.6 (H) 05/03/2017 1114   ALBUMIN 2.9 (L) 11/20/2017 0937   ALBUMIN 3.2 (L) 05/03/2017 1114   AST 12 (L) 11/20/2017 0937   AST 9 05/03/2017 1114   ALT 7 11/20/2017 0937   ALT <6 05/03/2017 1114   ALKPHOS 85 11/20/2017 0937   ALKPHOS 126 05/03/2017 1114   BILITOT 0.4 11/20/2017 0937   BILITOT 0.34 05/03/2017 1114   GFRNONAA >60 11/20/2017 0937   GFRAA >60 11/20/2017 0937    No results found for: SPEP, UPEP  Lab Results  Component Value Date   WBC 4.1  11/20/2017   NEUTROABS 3.3 11/20/2017   HGB 9.5 (L) 11/20/2017   HCT 28.1 (L) 11/20/2017   MCV 109.6 (H) 11/20/2017   PLT 288 11/20/2017      Chemistry      Component Value Date/Time   NA 144 11/20/2017 0937   NA 142 05/03/2017 1114   K 3.7 11/20/2017 0937   K 4.5 05/03/2017 1114   CL 111 11/20/2017 0937   CL 107 09/19/2012 1325   CO2 23 11/20/2017 0937   CO2 21 (L) 05/03/2017 1114   BUN 28 (H) 11/20/2017 0937   BUN 18.5 05/03/2017 1114   CREATININE 0.88 11/20/2017 0937   CREATININE 0.9 05/03/2017 1114      Component Value Date/Time   CALCIUM 8.9 11/20/2017 0937   CALCIUM 8.9 05/03/2017 1114   ALKPHOS 85 11/20/2017 0937   ALKPHOS 126 05/03/2017 1114   AST 12 (L) 11/20/2017 0937   AST 9 05/03/2017 1114   ALT 7 11/20/2017 0937   ALT <6 05/03/2017 1114   BILITOT 0.4 11/20/2017 0937   BILITOT 0.34 05/03/2017 1114      All questions were answered. The patient knows to call the clinic with any problems, questions or concerns. No barriers to learning was detected.  I spent 25 minutes counseling the patient face to face. The total time spent in the appointment was 30 minutes and more than 50% was on counseling and review of test results  [name], MD 11/20/2017 11:36 AM

## 2017-11-20 NOTE — Assessment & Plan Note (Signed)
He denies recent seizures He is on Keppra His wife noted that the patient has some changes in personality I do not believe that this is related to [name] or his treatment

## 2017-11-22 ENCOUNTER — Inpatient Hospital Stay: Payer: Medicare Other

## 2017-11-22 VITALS — BP 109/75 | HR 99 | Temp 97.8°F | Resp 16

## 2017-11-22 DIAGNOSIS — D61818 Other pancytopenia: Secondary | ICD-10-CM

## 2017-11-22 DIAGNOSIS — C7802 Secondary malignant neoplasm of left lung: Secondary | ICD-10-CM | POA: Diagnosis not present

## 2017-11-22 DIAGNOSIS — C3412 Malignant neoplasm of upper lobe, left bronchus or lung: Secondary | ICD-10-CM

## 2017-11-22 DIAGNOSIS — D4620 Refractory anemia with excess of blasts, unspecified: Secondary | ICD-10-CM

## 2017-11-22 MED ORDER — SODIUM CHLORIDE 0.9 % IV SOLN
1200.0000 mg | Freq: Once | INTRAVENOUS | Status: AC
  Administered 2017-11-22: 1200 mg via INTRAVENOUS
  Filled 2017-11-22: qty 20

## 2017-11-22 MED ORDER — DARBEPOETIN ALFA 500 MCG/ML IJ SOSY
500.0000 ug | PREFILLED_SYRINGE | Freq: Once | INTRAMUSCULAR | Status: AC
  Administered 2017-11-22: 500 ug via SUBCUTANEOUS

## 2017-11-22 MED ORDER — HEPARIN SOD (PORK) LOCK FLUSH 100 UNIT/ML IV SOLN
500.0000 [IU] | Freq: Once | INTRAVENOUS | Status: AC | PRN
  Administered 2017-11-22: 500 [IU]
  Filled 2017-11-22: qty 5

## 2017-11-22 MED ORDER — DARBEPOETIN ALFA 500 MCG/ML IJ SOSY
PREFILLED_SYRINGE | INTRAMUSCULAR | Status: AC
  Filled 2017-11-22: qty 1

## 2017-11-22 MED ORDER — SODIUM CHLORIDE 0.9 % IV SOLN
Freq: Once | INTRAVENOUS | Status: AC
  Administered 2017-11-22: 09:00:00 via INTRAVENOUS

## 2017-11-22 MED ORDER — SODIUM CHLORIDE 0.9% FLUSH
10.0000 mL | INTRAVENOUS | Status: DC | PRN
  Administered 2017-11-22: 10 mL
  Filled 2017-11-22: qty 10

## 2017-11-22 NOTE — Patient Instructions (Signed)
[facility] Discharge Instructions for Patients Receiving Chemotherapy  Today you received the following chemotherapy agents tecentriq   To help prevent nausea and vomiting after your treatment, we encourage you to take your nausea medication as directed  If you develop nausea and vomiting that is not controlled by your nausea medication, call the clinic.   BELOW ARE SYMPTOMS THAT SHOULD BE REPORTED IMMEDIATELY:  *FEVER GREATER THAN 100.5 F  *CHILLS WITH OR WITHOUT FEVER  NAUSEA AND VOMITING THAT IS NOT CONTROLLED WITH YOUR NAUSEA MEDICATION  *UNUSUAL SHORTNESS OF BREATH  *UNUSUAL BRUISING OR BLEEDING  TENDERNESS IN MOUTH AND THROAT WITH OR WITHOUT PRESENCE OF ULCERS  *URINARY PROBLEMS  *BOWEL PROBLEMS  UNUSUAL RASH Items with * indicate a potential emergency and should be followed up as soon as possible.  Feel free to call the clinic you have any questions or concerns. The clinic phone number is [phone].

## 2017-12-06 ENCOUNTER — Inpatient Hospital Stay: Payer: Medicare Other

## 2017-12-06 VITALS — BP 109/76 | HR 100 | Temp 97.9°F | Resp 18

## 2017-12-06 DIAGNOSIS — D4620 Refractory anemia with excess of blasts, unspecified: Secondary | ICD-10-CM

## 2017-12-06 DIAGNOSIS — C7802 Secondary malignant neoplasm of left lung: Secondary | ICD-10-CM | POA: Diagnosis not present

## 2017-12-06 DIAGNOSIS — C3412 Malignant neoplasm of upper lobe, left bronchus or lung: Secondary | ICD-10-CM

## 2017-12-06 LAB — CMP (CANCER CENTER ONLY)
ALT: 7 U/L (ref 0–44)
AST: 14 U/L — AB (ref 15–41)
Albumin: 2.7 g/dL — ABNORMAL LOW (ref 3.5–5.0)
Alkaline Phosphatase: 92 U/L (ref 38–126)
Anion gap: 9 (ref 5–15)
BUN: 25 mg/dL — AB (ref 8–23)
CHLORIDE: 111 mmol/L (ref 98–111)
CO2: 23 mmol/L (ref 22–32)
CREATININE: 0.89 mg/dL (ref 0.61–1.24)
Calcium: 8.7 mg/dL — ABNORMAL LOW (ref 8.9–10.3)
GFR, Est AFR Am: >60 mL/min (ref >60)
GFR, Estimated: >60 mL/min (ref >60)
Glucose, Bld: 86 mg/dL (ref 70–99)
Potassium: 3.9 mmol/L (ref 3.5–5.1)
SODIUM: 143 mmol/L (ref 135–145)
Total Bilirubin: 0.4 mg/dL (ref 0.3–1.2)
Total Protein: 8.6 g/dL — ABNORMAL HIGH (ref 6.5–8.1)

## 2017-12-06 LAB — CBC WITH DIFFERENTIAL (CANCER CENTER ONLY)
Basophils Absolute: 0 10*3/uL (ref 0.0–0.1)
Basophils Relative: 1 %
EOS ABS: 0 10*3/uL (ref 0.0–0.5)
Eosinophils Relative: 0 %
HCT: 27.4 % — ABNORMAL LOW (ref 38.4–49.9)
HEMOGLOBIN: 9 g/dL — AB (ref 13.0–17.1)
LYMPHS ABS: 0.6 10*3/uL — AB (ref 0.9–3.3)
LYMPHS PCT: 10 %
MCH: 36 pg — AB (ref 27.2–33.4)
MCHC: 32.9 g/dL (ref 32.0–36.0)
MCV: 109.7 fL — AB (ref 79.3–98.0)
MONOS PCT: 6 %
Monocytes Absolute: 0.3 10*3/uL (ref 0.1–0.9)
NEUTROS PCT: 83 %
Neutro Abs: 4.8 10*3/uL (ref 1.5–6.5)
Platelet Count: 212 10*3/uL (ref 140–400)
RBC: 2.5 MIL/uL — AB (ref 4.20–5.82)
RDW: 27.6 % — ABNORMAL HIGH (ref 11.0–14.6)
WBC: 5.7 10*3/uL (ref 4.0–10.3)

## 2017-12-06 LAB — SAMPLE TO BLOOD BANK

## 2017-12-06 MED ORDER — DARBEPOETIN ALFA 500 MCG/ML IJ SOSY
500.0000 ug | PREFILLED_SYRINGE | Freq: Once | INTRAMUSCULAR | Status: AC
  Administered 2017-12-06: 500 ug via SUBCUTANEOUS

## 2017-12-06 NOTE — Patient Instructions (Signed)

## 2017-12-13 ENCOUNTER — Telehealth: Payer: Self-pay | Admitting: Hematology and Oncology

## 2017-12-13 ENCOUNTER — Inpatient Hospital Stay: Payer: Medicare Other

## 2017-12-13 ENCOUNTER — Encounter: Payer: Self-pay | Admitting: Hematology and Oncology

## 2017-12-13 ENCOUNTER — Inpatient Hospital Stay (HOSPITAL_BASED_OUTPATIENT_CLINIC_OR_DEPARTMENT_OTHER): Payer: Medicare Other | Admitting: Hematology and Oncology

## 2017-12-13 ENCOUNTER — Inpatient Hospital Stay: Payer: Medicare Other | Attending: Hematology and Oncology

## 2017-12-13 VITALS — BP 117/75 | HR 93 | Temp 97.4°F | Resp 18 | Ht 72.0 in | Wt 100.1 lb

## 2017-12-13 VITALS — BP 126/72 | HR 75 | Temp 97.3°F | Resp 18

## 2017-12-13 DIAGNOSIS — C3412 Malignant neoplasm of upper lobe, left bronchus or lung: Secondary | ICD-10-CM

## 2017-12-13 DIAGNOSIS — D4620 Refractory anemia with excess of blasts, unspecified: Secondary | ICD-10-CM

## 2017-12-13 DIAGNOSIS — R64 Cachexia: Secondary | ICD-10-CM

## 2017-12-13 DIAGNOSIS — Z923 Personal history of irradiation: Secondary | ICD-10-CM | POA: Insufficient documentation

## 2017-12-13 DIAGNOSIS — D46Z Other myelodysplastic syndromes: Secondary | ICD-10-CM

## 2017-12-13 DIAGNOSIS — F17210 Nicotine dependence, cigarettes, uncomplicated: Secondary | ICD-10-CM | POA: Insufficient documentation

## 2017-12-13 DIAGNOSIS — D469 Myelodysplastic syndrome, unspecified: Secondary | ICD-10-CM | POA: Insufficient documentation

## 2017-12-13 DIAGNOSIS — Z8589 Personal history of malignant neoplasm of other organs and systems: Secondary | ICD-10-CM | POA: Insufficient documentation

## 2017-12-13 DIAGNOSIS — Z5112 Encounter for antineoplastic immunotherapy: Secondary | ICD-10-CM | POA: Insufficient documentation

## 2017-12-13 DIAGNOSIS — R1310 Dysphagia, unspecified: Secondary | ICD-10-CM

## 2017-12-13 DIAGNOSIS — C3490 Malignant neoplasm of unspecified part of unspecified bronchus or lung: Secondary | ICD-10-CM

## 2017-12-13 DIAGNOSIS — D61818 Other pancytopenia: Secondary | ICD-10-CM

## 2017-12-13 LAB — CMP (CANCER CENTER ONLY)
ALBUMIN: 2.6 g/dL — AB (ref 3.5–5.0)
ALT: 11 U/L (ref 0–44)
AST: 15 U/L (ref 15–41)
Alkaline Phosphatase: 94 U/L (ref 38–126)
Anion gap: 8 (ref 5–15)
BUN: 20 mg/dL (ref 8–23)
CO2: 22 mmol/L (ref 22–32)
Calcium: 8.1 mg/dL — ABNORMAL LOW (ref 8.9–10.3)
Chloride: 112 mmol/L — ABNORMAL HIGH (ref 98–111)
Creatinine: 0.78 mg/dL (ref 0.61–1.24)
GFR, Est AFR Am: >60 mL/min (ref >60)
GFR, Estimated: >60 mL/min (ref >60)
GLUCOSE: 88 mg/dL (ref 70–99)
POTASSIUM: 4 mmol/L (ref 3.5–5.1)
Sodium: 142 mmol/L (ref 135–145)
Total Bilirubin: 0.3 mg/dL (ref 0.3–1.2)
Total Protein: 8.1 g/dL (ref 6.5–8.1)

## 2017-12-13 LAB — PREPARE RBC (CROSSMATCH)

## 2017-12-13 LAB — CBC WITH DIFFERENTIAL (CANCER CENTER ONLY)
Basophils Absolute: 0 10*3/uL (ref 0.0–0.1)
Basophils Relative: 1 %
EOS PCT: 1 %
Eosinophils Absolute: 0 10*3/uL (ref 0.0–0.5)
HEMATOCRIT: 23 % — AB (ref 38.4–49.9)
Hemoglobin: 7.5 g/dL — ABNORMAL LOW (ref 13.0–17.1)
LYMPHS PCT: 11 %
Lymphs Abs: 0.4 10*3/uL — ABNORMAL LOW (ref 0.9–3.3)
MCH: 36.3 pg — AB (ref 27.2–33.4)
MCHC: 32.7 g/dL (ref 32.0–36.0)
MCV: 110.9 fL — ABNORMAL HIGH (ref 79.3–98.0)
MONO ABS: 0.3 10*3/uL (ref 0.1–0.9)
Monocytes Relative: 10 %
Neutro Abs: 2.8 10*3/uL (ref 1.5–6.5)
Neutrophils Relative %: 77 %
PLATELETS: 197 10*3/uL (ref 140–400)
RBC: 2.07 MIL/uL — AB (ref 4.20–5.82)
RDW: 27.1 % — ABNORMAL HIGH (ref 11.0–14.6)
WBC: 3.6 10*3/uL — AB (ref 4.0–10.3)

## 2017-12-13 LAB — TSH: TSH: 1.147 u[IU]/mL (ref 0.320–4.118)

## 2017-12-13 LAB — SAMPLE TO BLOOD BANK

## 2017-12-13 MED ORDER — DIPHENHYDRAMINE HCL 25 MG PO CAPS
25.0000 mg | ORAL_CAPSULE | Freq: Once | ORAL | Status: AC
  Administered 2017-12-13: 25 mg via ORAL

## 2017-12-13 MED ORDER — SODIUM CHLORIDE 0.9% FLUSH
3.0000 mL | INTRAVENOUS | Status: DC | PRN
  Filled 2017-12-13: qty 10

## 2017-12-13 MED ORDER — DIPHENHYDRAMINE HCL 25 MG PO CAPS
ORAL_CAPSULE | ORAL | Status: AC
  Filled 2017-12-13: qty 1

## 2017-12-13 MED ORDER — HEPARIN SOD (PORK) LOCK FLUSH 100 UNIT/ML IV SOLN
500.0000 [IU] | Freq: Once | INTRAVENOUS | Status: AC | PRN
  Administered 2017-12-13: 500 [IU]
  Filled 2017-12-13: qty 5

## 2017-12-13 MED ORDER — SODIUM CHLORIDE 0.9% FLUSH
10.0000 mL | Freq: Once | INTRAVENOUS | Status: AC
  Administered 2017-12-13: 10 mL
  Filled 2017-12-13: qty 10

## 2017-12-13 MED ORDER — SODIUM CHLORIDE 0.9 % IV SOLN
Freq: Once | INTRAVENOUS | Status: AC
  Administered 2017-12-13: 11:00:00 via INTRAVENOUS
  Filled 2017-12-13: qty 250

## 2017-12-13 MED ORDER — SODIUM CHLORIDE 0.9 % IV SOLN
1200.0000 mg | Freq: Once | INTRAVENOUS | Status: AC
  Administered 2017-12-13: 1200 mg via INTRAVENOUS
  Filled 2017-12-13: qty 20

## 2017-12-13 MED ORDER — SODIUM CHLORIDE 0.9% IV SOLUTION
250.0000 mL | Freq: Once | INTRAVENOUS | Status: AC
  Filled 2017-12-13: qty 250

## 2017-12-13 MED ORDER — ACETAMINOPHEN 325 MG PO TABS
ORAL_TABLET | ORAL | Status: AC
  Filled 2017-12-13: qty 2

## 2017-12-13 MED ORDER — SODIUM CHLORIDE 0.9% FLUSH
10.0000 mL | INTRAVENOUS | Status: DC | PRN
  Administered 2017-12-13: 10 mL
  Filled 2017-12-13: qty 10

## 2017-12-13 MED ORDER — ACETAMINOPHEN 325 MG PO TABS
650.0000 mg | ORAL_TABLET | Freq: Once | ORAL | Status: AC
  Administered 2017-12-13: 650 mg via ORAL

## 2017-12-13 NOTE — Assessment & Plan Note (Signed)
He had recent dysphagia Last PET CT scan did not review signs of local recurrence I plan to repeat imaging study again in 3 weeks

## 2017-12-13 NOTE — Assessment & Plan Note (Signed)
So far he tolerated chemo well Unfortunately, he continues to smoke I recommend smoking cessation immediately I plan to repeat imaging study before he returns in 3 weeks

## 2017-12-13 NOTE — Assessment & Plan Note (Signed)
He has mild dysphagia likely due to prior radiation treatment He denies choking or difficulty swallowing regular food to maintain his nutritional status I do not recommend speech and therapy evaluation as I do not believe he will bring any additional benefit at this point He is not a candidate for esophageal dilation procedure

## 2017-12-13 NOTE — Progress Notes (Signed)
[name] OFFICE PROGRESS NOTE  Patient Care Team: Adrian Arabian, MD as PCP - General (Family Medicine) Adrian Lark, MD as Consulting Physician (Hematology and Oncology)  ASSESSMENT & PLAN:  Cancer of upper lobe of left lung [facility]) So far he tolerated chemo well Unfortunately, he continues to smoke I recommend smoking cessation immediately I plan to repeat imaging study before he returns in 3 weeks  History of head and neck cancer He had recent dysphagia Last PET CT scan did not review signs of local recurrence I plan to repeat imaging study again in 3 weeks  MDS (myelodysplastic syndrome), low grade ([place]) He is frail with symptomatic anemia We discussed some of the risks, benefits, and alternatives of blood transfusions. The patient is symptomatic from anemia and the hemoglobin level is critically low.  Some of the side-effects to be expected including risks of transfusion reactions, chills, infection, syndrome of volume overload and risk of hospitalization from various reasons and the patient is willing to proceed and went ahead to sign consent today. He will receive 1 unit of blood today He will get darbepoetin injection every 2 weeks, due next visit The goal for darbepoetin injection is to keep hemoglobin greater than 10 We will continue blood transfusion as needed to keep hemoglobin greater than 8 g He needs 1 unit of blood whenever hemoglobin is less than 8  Malignant cachexia ([name]) He has gained 2 pounds since I started him on Remeron We will continue the same  Dysphagia He has mild dysphagia likely due to prior radiation treatment He denies choking or difficulty swallowing regular food to maintain his nutritional status I do not recommend speech and therapy evaluation as I do not believe he will bring any additional benefit at this point He is not a candidate for esophageal dilation procedure   Orders Placed This Encounter  Procedures  . NM PET Image  Restag (PS) Skull Base To Thigh    Standing Status:   Future    Standing Expiration Date:   12/14/2018    Order Specific Question:   If indicated for the ordered procedure, I authorize the administration of a radiopharmaceutical per Radiology protocol    Answer:   Yes    Order Specific Question:   Preferred imaging location?    Answer:   [name]    Order Specific Question:   Radiology Contrast Protocol - do NOT remove file path    Answer:   _[email or web address]    INTERVAL HISTORY: Please see below for problem oriented charting. He returns with his wife for further follow-up His wife noted that he has complained of pill dysphagia He denies swallowing difficulties with regular food or liquids He denies recent choking or aspiration No recent fever, chills or signs of infection He has chronic cough due to COPD but denies hemoptysis He continues to smoke several cigarettes per day No recent falls or seizure disorder  SUMMARY OF ONCOLOGIC HISTORY: Oncology History   MDS, R-IPSS score of 3, low risk (Hg 8.9, +16 chromosome on BM, 2% blast count)   Squamous cell carcinoma of the epiglottis   Primary site: Larynx - Supraglottis (Left)   Staging method: AJCC 7th Edition   Clinical: Stage I (T1, N0, M0) signed by Adrian Lark, MD on 04/30/2013 12:49 PM   Pathologic: Stage I (T1, N0, cM0) signed by Adrian Lark, MD on 04/30/2013 12:49 PM   Summary: Stage I (T1, N0, cM0)       MDS (myelodysplastic  syndrome), low grade ([name])   02/01/2007 Bone Marrow Biopsy    BM biopsy was abnormal, overall probable low grade MDS      03/23/2011 - 02/06/2013 Chemotherapy    [name], discontinued due to diagnosis of laryngeal ca      09/06/2013 Bone Marrow Biopsy    Repeat bone marrow aspirate and biopsy confirmed low-grade myelodysplastic syndrome.      09/17/2013 -  Chemotherapy    The patient resumed darbepoetin injections to treat the anemia.        History of head and neck cancer   03/06/2013 Procedure    Biopsy from epiglottic region showed invasive [name]      04/25/2013 Imaging    Ct scan showed no other involvement in the LN      05/13/2013 - 06/28/2013 Radiation Therapy    He received radiation therapy, 70 gray in 35 fractions to the larynx      06/09/2016 Imaging    CT scan of the chest showed Bilateral spiculated soft tissue pulmonary masses, highly suspicious for multifocal malignancy. Borderline enlarged left-sided mediastinal lymphadenopathy. Background of severe emphysematous changes, chronic cylindrical bronchiectasis and hyperinflation of the lungs. Findings consistent with longstanding COPD. Diffusely thickened interstitial markings. This may represent chronic interstitial lung changes, however lymphangitic spread of malignancy cannot be excluded. Anterior compression deformities of several of the thoracic vertebral bodies, with heterogeneous appearance of T7 and T9 vertebral body. This may represent degenerative changes, however osseous metastatic disease cannot be excluded.       07/11/2016 PET scan    There are 2 nodules in the left upper lobe and 2 nodules within the right lower lobe which have spiculated margin and exhibit intense radiotracer uptake compatible with multifocal pulmonary metastasis versus multiple synchronous primary pulmonary neoplasms. 2. Emphysema 3. Aortic atherosclerosis and multi vessel coronary artery calcification.       Cancer of upper lobe of left lung ([name])   06/09/2016 Imaging    CT chest: Bilateral spiculated soft tissue pulmonary masses, highly suspicious for multifocal malignancy. Borderline enlarged left-sided mediastinal lymphadenopathy. Background of severe emphysematous changes, chronic cylindrical bronchiectasis and hyperinflation of the lungs. Findings consistent with longstanding COPD. Diffusely thickened interstitial markings. This may represent chronic interstitial lung changes, however  lymphangitic spread of malignancy cannot be excluded. Anterior compression deformities of several of the thoracic vertebral bodies, with heterogeneous appearance of T7 and T9 vertebral body. This may represent degenerative changes, however osseous metastatic disease cannot be excluded.       06/29/2016 Imaging    MRI brain: No evidence of metastatic disease or recent infarction. Advanced generalized brain atrophy with moderate to marked chronic small-vessel ischemic changes throughout. FLAIR imaging appears to show scattered gyri showing FLAIR hyperintensity. These abnormalities are not confirmed with restricted diffusion or contrast enhancement. Therefore, we are not certain that this is not artifactual. The differential diagnosis does include true pathology such as posterior reversible encephalopathy, viral or prion disease, postictal change in post chemotherapy change. If there is concern about active CNS pathology, re- scanning in 4-6 weeks could be useful.      07/27/2016 Imaging    CT chest: Bilateral pulmonary lesions. These are suspicious for metastatic disease. Lesions have minimally changed but there may be slight enlargement of the cavitary lesion in the right lower lobe. Severe emphysematous changes.       08/10/2016 Pathology Results    1. Lung, biopsy, RUL, (apical segment) - SCANT BENIGN LUNG PARENCHYMA. - THERE IS NO EVIDENCE  OF MALIGNANCY. 2. Lung, biopsy, LUL lingula - SQUAMOUS CELL CARCINOMA. - SEE COMMENT. 3. Lung, biopsy, LUL - ADENOCARCINOMA. - SEE COMMENT. Microscopic Comment 2. The malignant cells are positive for cytokeratin 56 and p63. They are negative for TTF-1. The findings are consistent with squamous cell carcinoma. There is likely insufficient tissue remaining for additional studies, if requested. 3. The malignant cells are positive for TTF-1 and negative for p63 and cytokeratin 5/6. The findings are consistent with adenocarcinoma.      08/10/2016 Procedure     The targets were defined as follows: Left upper lobe nodule, target 1 Lingular nodule, target 2 Proximal right lower lobe nodule, target 3 More distal right lower lobe, nodule  The extendable working channel was secured into place and the locator guide was withdrawn. Under fluoroscopic guidance transbronchial needle brushings, transbronchial Wang needle biopsies, and transbronchial forceps biopsies were performed in the LUL (target 1) and the lingula (target 2) to be sent for cytology and pathology. A bronchioalveolar lavage was performed in the lingula in the vicinity of target 2  and sent for cytology and microbiology (bacterial, fungal, AFB smears and cultures).  Fiducial markers were then placed around target 1, target 2, and in the right lower lobe in the vicinity of both target 3 and target 4 to be used for possible radiation therapy should this be indicated. At the end of the procedure a general airway inspection was performed and there was no evidence of active bleeding. The bronchoscope was removed.  The patient tolerated the procedure well. There was no significant blood loss and there were no obvious complications. A post-procedural chest x-ray is pending.  Samples: 1. Transbronchial needle brushings from targets 1 and 2 2. Transbronchial Wang needle biopsies from targets 1 and 2 3. Transbronchial forceps biopsies from targets 1 and 2 4. Bronchoalveolar lavage from lingula, target 2 5. Endobronchial biopsies from RUL apical segment 6. Endobronchial brushings from right upper lobe apical segment       08/10/2016 Genetic Testing    Patient has genetic testing done for [employer] and PD-L1 testing Results revealed PD-L1 testing is negative 0%      08/31/2016 Procedure    Technically successful right IJ power-injectable port catheter placement. Ready for routine use      09/21/2016 - 06/08/2017 Chemotherapy    The patient had chemotherapy with carboplatin and Taxol.       11/30/2016 PET scan    1. Interval resection of the hypermetabolic lingular nodule. 2. The 3 other spiculated, hypermetabolic nodules within the left upper and right lower lobes are slightly smaller with decreased metabolic activity, consistent with some response to interval therapy. 3. No progressive disease      03/06/2017 PET scan    1. Mixed appearance, with significant improvement in the left upper lobe nodule ; stable appearance of the more cephalad right lower lobe nodule; but enlargement and increased in metabolic activity of the more inferior right lower lobe nodule which appears cavitary. No new nodule. 2. Previously there was some moderate increase in diffuse marrow activity. That has increased further, and could be a response from prior granulocyte/marrow stimulation, although I cannot completely exclude the possibility that this is related to the patient's myelodysplastic syndrome. No CT correlate. 3. Other imaging findings of potential clinical significance: Aortic Atherosclerosis (ICD10-I70.0) and Emphysema (ICD10-J43.9). Coronary atherosclerosis. Low-density blood pool suggests anemia. Chronic right upper lobe scarring. Mucus in both mainstem bronchi. Suspected left renal cysts. Indistinct but likely enlarged prostate gland.  04/06/2017 - 04/18/2017 Radiation Therapy    He received SBRT treatment Radiation treatment dates:   04/06/17 - 04/18/17  Site/dose:   Right SBRT Lung// 50 Gy in 5 fx  Beams/energy:   Photon // SRBT/ SBT-3D       06/26/2017 PET scan    1. Clear interval response to therapy of the 2 right lower lobe pulmonary nodules, each decreased in both size and hypermetabolism. 2. Stable size and hypermetabolic activity in the left upper lobe nodule. 3. Diffusely decreased FDG accumulation within the marrow space. 4. Symmetric uptake in both parotid glands is likely physiologic. Given the symmetry, this is most likely physiologic or inflammatory. 5.  Prostatomegaly with marked bladder distention. Component of bladder outlet obstruction suspected. 6. Emphysema. (ICD10-J43.9) 7. Aortic Atherosclerois (ICD10-170.0)      09/25/2017 PET scan    1. New and enlarging small hypermetabolic nodules in the lingula are compatible with metastatic disease. These are in proximity to the linear scarring fiducial markers. 2. New hypermetabolic subcarinal nodal metastasis. 3. Left upper lobe index nodule is stable to slightly progressed in the interval in shows mild increase in hypermetabolism. 4. No substantial change in the 2 index right lower lobe nodules showing only minimal FDG uptake, as before. 5. Interval progression of patchy airspace opacity in both dependent lower lungs, compatible with infection/inflammation.      10/12/2017 -  Chemotherapy    The patient had atezolizumab for chemotherapy treatment.        REVIEW OF SYSTEMS:   Constitutional: Denies fevers, chills or abnormal weight loss Eyes: Denies blurriness of vision Ears, nose, mouth, throat, and face: Denies mucositis or sore throat Respiratory: Denies cough, dyspnea or wheezes Cardiovascular: Denies palpitation, chest discomfort or lower extremity swelling Gastrointestinal:  Denies nausea, heartburn or change in bowel habits Skin: Denies abnormal skin rashes Lymphatics: Denies new lymphadenopathy or easy bruising Neurological:Denies numbness, tingling or new weaknesses Behavioral/Psych: Mood is stable, no new changes  All other systems were reviewed with the patient and are negative.  I have reviewed the past medical history, past surgical history, social history and family history with the patient and they are unchanged from previous note.  ALLERGIES:  is allergic to no known allergies.  MEDICATIONS:  Current Outpatient Medications  Medication Sig Dispense Refill  . albuterol (PROVENTIL HFA;VENTOLIN HFA) 108 (90 Base) MCG/ACT inhaler Inhale 2 puffs into the lungs every 4  (four) hours as needed for wheezing or shortness of breath. (Patient not taking: Reported on 11/15/2017) 1 Inhaler 5  . cyanocobalamin 500 MCG tablet Take 500 mcg by mouth daily.    . ferrous sulfate 325 (65 FE) MG tablet Take 325 mg by mouth daily with breakfast.    . folic acid (FOLVITE) 1 MG tablet Take 1 mg by mouth daily.    . Glycopyrrolate-Formoterol (BEVESPI AEROSPHERE) 9-4.8 MCG/ACT AERO Inhale 2 puffs into the lungs 2 (two) times daily. 2 Inhaler 0  . levETIRAcetam (KEPPRA) 500 MG tablet TAKE 1 TABLET BY MOUTH TWICE A DAY 180 tablet 3  . mirtazapine (REMERON) 15 MG tablet Take 1 tablet (15 mg total) by mouth at bedtime. 30 tablet 1  . vitamin E 1000 UNIT capsule Take 1,000 Units by mouth daily.      No current facility-administered medications for this visit.     PHYSICAL EXAMINATION: ECOG PERFORMANCE STATUS: 2 - Symptomatic, <50% confined to bed  Vitals:   12/13/17 0934  BP: 117/75  Pulse: 93  Resp: 18  Temp: (!) 97.4  F (36.3 C)  SpO2: 94%   Filed Weights   12/13/17 0934  Weight: 100 lb 1.6 oz (45.4 kg)    GENERAL:alert, no distress and comfortable.  He looks thin and cachectic SKIN: skin color, texture, turgor are normal, no rashes or significant lesions EYES: normal, Conjunctiva are pink and non-injected, sclera clear OROPHARYNX:no exudate, no erythema and lips, buccal mucosa, and tongue normal  NECK: supple, thyroid normal size, non-tender, without nodularity LYMPH:  no palpable lymphadenopathy in the cervical, axillary or inguinal LUNGS: clear to auscultation and percussion with normal breathing effort HEART: regular rate & rhythm and no murmurs and no lower extremity edema ABDOMEN:abdomen soft, non-tender and normal bowel sounds Musculoskeletal: No peripheral cyanosis.  Noted digital clubbing NEURO: alert & oriented x 3 with fluent speech, no focal motor/sensory deficits  LABORATORY DATA:  I have reviewed the data as listed    Component Value Date/Time    NA 142 12/13/2017 0920   NA 142 05/03/2017 1114   K 4.0 12/13/2017 0920   K 4.5 05/03/2017 1114   CL 112 (H) 12/13/2017 0920   CL 107 09/19/2012 1325   CO2 22 12/13/2017 0920   CO2 21 (L) 05/03/2017 1114   GLUCOSE 88 12/13/2017 0920   GLUCOSE 88 05/03/2017 1114   GLUCOSE 97 09/19/2012 1325   BUN 20 12/13/2017 0920   BUN 18.5 05/03/2017 1114   CREATININE 0.78 12/13/2017 0920   CREATININE 0.9 05/03/2017 1114   CALCIUM 8.1 (L) 12/13/2017 0920   CALCIUM 8.9 05/03/2017 1114   PROT 8.1 12/13/2017 0920   PROT 8.6 (H) 05/03/2017 1114   ALBUMIN 2.6 (L) 12/13/2017 0920   ALBUMIN 3.2 (L) 05/03/2017 1114   AST 15 12/13/2017 0920   AST 9 05/03/2017 1114   ALT 11 12/13/2017 0920   ALT <6 05/03/2017 1114   ALKPHOS 94 12/13/2017 0920   ALKPHOS 126 05/03/2017 1114   BILITOT 0.3 12/13/2017 0920   BILITOT 0.34 05/03/2017 1114   GFRNONAA >60 12/13/2017 0920   GFRAA >60 12/13/2017 0920    No results found for: SPEP, UPEP  Lab Results  Component Value Date   WBC 3.6 (L) 12/13/2017   NEUTROABS 2.8 12/13/2017   HGB 7.5 (L) 12/13/2017   HCT 23.0 (L) 12/13/2017   MCV 110.9 (H) 12/13/2017   PLT 197 12/13/2017      Chemistry      Component Value Date/Time   NA 142 12/13/2017 0920   NA 142 05/03/2017 1114   K 4.0 12/13/2017 0920   K 4.5 05/03/2017 1114   CL 112 (H) 12/13/2017 0920   CL 107 09/19/2012 1325   CO2 22 12/13/2017 0920   CO2 21 (L) 05/03/2017 1114   BUN 20 12/13/2017 0920   BUN 18.5 05/03/2017 1114   CREATININE 0.78 12/13/2017 0920   CREATININE 0.9 05/03/2017 1114      Component Value Date/Time   CALCIUM 8.1 (L) 12/13/2017 0920   CALCIUM 8.9 05/03/2017 1114   ALKPHOS 94 12/13/2017 0920   ALKPHOS 126 05/03/2017 1114   AST 15 12/13/2017 0920   AST 9 05/03/2017 1114   ALT 11 12/13/2017 0920   ALT <6 05/03/2017 1114   BILITOT 0.3 12/13/2017 0920   BILITOT 0.34 05/03/2017 1114     All questions were answered. The patient knows to call the clinic with any problems,  questions or concerns. No barriers to learning was detected.  I spent 30 minutes counseling the patient face to face. The total time spent in  the appointment was 40 minutes and more than 50% was on counseling and review of test results  Adrian Lark, MD 12/13/2017 9:56 AM

## 2017-12-13 NOTE — Progress Notes (Signed)
OK to treat with today's CBC per Dr Alvy Bimler.

## 2017-12-13 NOTE — Progress Notes (Signed)
Pt with hemoglobin 7.5. Will be receiving 1 U PRBC today. Per Dr. Alvy Bimler, ok to treat with tecentriq.

## 2017-12-13 NOTE — Assessment & Plan Note (Signed)
He is frail with symptomatic anemia We discussed some of the risks, benefits, and alternatives of blood transfusions. The patient is symptomatic from anemia and the hemoglobin level is critically low.  Some of the side-effects to be expected including risks of transfusion reactions, chills, infection, syndrome of volume overload and risk of hospitalization from various reasons and the patient is willing to proceed and went ahead to sign consent today. He will receive 1 unit of blood today He will get darbepoetin injection every 2 weeks, due next visit The goal for darbepoetin injection is to keep hemoglobin greater than 10 We will continue blood transfusion as needed to keep hemoglobin greater than 8 g He needs 1 unit of blood whenever hemoglobin is less than 8

## 2017-12-13 NOTE — Assessment & Plan Note (Signed)
He has gained 2 pounds since I started him on Remeron We will continue the same

## 2017-12-13 NOTE — Telephone Encounter (Signed)
Gave avs and calendar ° °

## 2017-12-13 NOTE — Patient Instructions (Signed)
[place] Discharge Instructions for Patients Receiving Chemotherapy  Today you received the following chemotherapy agents tecentriq   To help prevent nausea and vomiting after your treatment, we encourage you to take your nausea medication as directed  If you develop nausea and vomiting that is not controlled by your nausea medication, call the clinic.   BELOW ARE SYMPTOMS THAT SHOULD BE REPORTED IMMEDIATELY:  *FEVER GREATER THAN 100.5 F  *CHILLS WITH OR WITHOUT FEVER  NAUSEA AND VOMITING THAT IS NOT CONTROLLED WITH YOUR NAUSEA MEDICATION  *UNUSUAL SHORTNESS OF BREATH  *UNUSUAL BRUISING OR BLEEDING  TENDERNESS IN MOUTH AND THROAT WITH OR WITHOUT PRESENCE OF ULCERS  *URINARY PROBLEMS  *BOWEL PROBLEMS  UNUSUAL RASH Items with * indicate a potential emergency and should be followed up as soon as possible.  Feel free to call the clinic you have any questions or concerns. The clinic phone number is [phone].   Blood Transfusion, Care After This sheet gives you information about how to care for yourself after your procedure. Your doctor may also give you more specific instructions. If you have problems or questions, contact your doctor. Follow these instructions at home:  Take over-the-counter and prescription medicines only as told by your doctor.  Go back to your normal activities as told by your doctor.  Follow instructions from your doctor about how to take care of the area where an IV tube was put into your vein (insertion site). Make sure you: ? Wash your hands with soap and water before you change your bandage (dressing). If there is no soap and water, use hand sanitizer. ? Change your bandage as told by your doctor.  Check your IV insertion site every day for signs of infection. Check for: ? More redness, swelling, or pain. ? More fluid or blood. ? Warmth. ? Pus or a bad smell. Contact a doctor if:  You have more redness, swelling, or pain  around the IV insertion site..  You have more fluid or blood coming from the IV insertion site.  Your IV insertion site feels warm to the touch.  You have pus or a bad smell coming from the IV insertion site.  Your pee (urine) turns pink, red, or brown.  You feel weak after doing your normal activities. Get help right away if:  You have signs of a serious allergic or body defense (immune) system reaction, including: ? Itchiness. ? Hives. ? Trouble breathing. ? Anxiety. ? Pain in your chest or lower back. ? Fever, flushing, and chills. ? Fast pulse. ? Rash. ? Watery poop (diarrhea). ? Throwing up (vomiting). ? Dark pee. ? Serious headache. ? Dizziness. ? Stiff neck. ? Yellow color in your face or the white parts of your eyes (jaundice). Summary  After a blood transfusion, return to your normal activities as told by your doctor.  Every day, check for signs of infection where the IV tube was put into your vein.  Some signs of infection are warm skin, more redness and pain, more fluid or blood, and pus or a bad smell where the needle went in.  Contact your doctor if you feel weak or have any unusual symptoms. This information is not intended to replace advice given to you by your health care provider. Make sure you discuss any questions you have with your health care provider. Document Released: 05/16/2014 Document Revised: 12/18/2015 Document Reviewed: 12/18/2015 Elsevier Interactive Patient Education  2017 [employer].

## 2017-12-14 LAB — TYPE AND SCREEN
ABO/RH(D): O POS
ANTIBODY SCREEN: NEGATIVE
DONOR AG TYPE: NEGATIVE
Unit division: 0

## 2017-12-14 LAB — BPAM RBC
BLOOD PRODUCT EXPIRATION DATE: 201909042359
ISSUE DATE / TIME: 201908071254
UNIT TYPE AND RH: 5100

## 2017-12-15 ENCOUNTER — Telehealth: Payer: Self-pay

## 2017-12-15 NOTE — Telephone Encounter (Signed)
Hassan Rowan called and left a message to verify upcoming appts.  Called back and left message that appts are correct. Scheduling message sent to see Dr. Alvy Bimler 8/28 at 1130.

## 2017-12-20 ENCOUNTER — Telehealth: Payer: Self-pay | Admitting: Hematology and Oncology

## 2017-12-20 ENCOUNTER — Inpatient Hospital Stay: Payer: Medicare Other

## 2017-12-20 VITALS — BP 96/59 | HR 82 | Temp 97.5°F | Resp 18

## 2017-12-20 DIAGNOSIS — C3412 Malignant neoplasm of upper lobe, left bronchus or lung: Secondary | ICD-10-CM

## 2017-12-20 DIAGNOSIS — D4620 Refractory anemia with excess of blasts, unspecified: Secondary | ICD-10-CM

## 2017-12-20 DIAGNOSIS — Z5112 Encounter for antineoplastic immunotherapy: Secondary | ICD-10-CM | POA: Diagnosis not present

## 2017-12-20 LAB — CMP (CANCER CENTER ONLY)
ALBUMIN: 2.4 g/dL — AB (ref 3.5–5.0)
ALK PHOS: 84 U/L (ref 38–126)
ALT: 11 U/L (ref 0–44)
ANION GAP: 10 (ref 5–15)
AST: 14 U/L — AB (ref 15–41)
BILIRUBIN TOTAL: 0.3 mg/dL (ref 0.3–1.2)
BUN: 23 mg/dL (ref 8–23)
CO2: 22 mmol/L (ref 22–32)
Calcium: 8 mg/dL — ABNORMAL LOW (ref 8.9–10.3)
Chloride: 112 mmol/L — ABNORMAL HIGH (ref 98–111)
Creatinine: 0.84 mg/dL (ref 0.61–1.24)
GFR, Est AFR Am: >60 mL/min (ref >60)
GFR, Estimated: >60 mL/min (ref >60)
GLUCOSE: 81 mg/dL (ref 70–99)
POTASSIUM: 3.9 mmol/L (ref 3.5–5.1)
SODIUM: 144 mmol/L (ref 135–145)
TOTAL PROTEIN: 8 g/dL (ref 6.5–8.1)

## 2017-12-20 LAB — CBC WITH DIFFERENTIAL (CANCER CENTER ONLY)
Basophils Absolute: 0 10*3/uL (ref 0.0–0.1)
Basophils Relative: 0 %
Eosinophils Absolute: 0.1 10*3/uL (ref 0.0–0.5)
Eosinophils Relative: 1 %
HEMATOCRIT: 29.6 % — AB (ref 38.4–49.9)
Hemoglobin: 9.1 g/dL — ABNORMAL LOW (ref 13.0–17.1)
LYMPHS PCT: 18 %
Lymphs Abs: 0.7 10*3/uL — ABNORMAL LOW (ref 0.9–3.3)
MCH: 33.6 pg — AB (ref 27.2–33.4)
MCHC: 30.7 g/dL — AB (ref 32.0–36.0)
MCV: 109.2 fL — AB (ref 79.3–98.0)
MONO ABS: 0.3 10*3/uL (ref 0.1–0.9)
MONOS PCT: 7 %
NEUTROS ABS: 2.8 10*3/uL (ref 1.5–6.5)
Neutrophils Relative %: 74 %
Platelet Count: 208 10*3/uL (ref 140–400)
RBC: 2.71 MIL/uL — ABNORMAL LOW (ref 4.20–5.82)
RDW: 25.5 % — AB (ref 11.0–14.6)
WBC Count: 3.8 10*3/uL — ABNORMAL LOW (ref 4.0–10.3)

## 2017-12-20 LAB — SAMPLE TO BLOOD BANK

## 2017-12-20 MED ORDER — DARBEPOETIN ALFA 500 MCG/ML IJ SOSY
PREFILLED_SYRINGE | INTRAMUSCULAR | Status: AC
  Filled 2017-12-20: qty 1

## 2017-12-20 MED ORDER — DARBEPOETIN ALFA 500 MCG/ML IJ SOSY
500.0000 ug | PREFILLED_SYRINGE | Freq: Once | INTRAMUSCULAR | Status: AC
  Administered 2017-12-20: 500 ug via SUBCUTANEOUS

## 2017-12-20 NOTE — Telephone Encounter (Signed)
Patient scheduled per 8/9 sch message

## 2017-12-27 ENCOUNTER — Inpatient Hospital Stay: Payer: Medicare Other

## 2017-12-27 VITALS — BP 105/63 | HR 71 | Temp 97.7°F | Resp 16

## 2017-12-27 DIAGNOSIS — D4620 Refractory anemia with excess of blasts, unspecified: Secondary | ICD-10-CM

## 2017-12-27 DIAGNOSIS — C3412 Malignant neoplasm of upper lobe, left bronchus or lung: Secondary | ICD-10-CM

## 2017-12-27 DIAGNOSIS — Z5112 Encounter for antineoplastic immunotherapy: Secondary | ICD-10-CM | POA: Diagnosis not present

## 2017-12-27 LAB — CBC WITH DIFFERENTIAL (CANCER CENTER ONLY)
BASOS ABS: 0 10*3/uL (ref 0.0–0.1)
Basophils Relative: 1 %
Eosinophils Absolute: 0.1 10*3/uL (ref 0.0–0.5)
Eosinophils Relative: 2 %
HEMATOCRIT: 24.6 % — AB (ref 38.4–49.9)
HEMOGLOBIN: 8.1 g/dL — AB (ref 13.0–17.1)
LYMPHS PCT: 16 %
Lymphs Abs: 0.5 10*3/uL — ABNORMAL LOW (ref 0.9–3.3)
MCH: 34.9 pg — ABNORMAL HIGH (ref 27.2–33.4)
MCHC: 32.9 g/dL (ref 32.0–36.0)
MCV: 106.2 fL — ABNORMAL HIGH (ref 79.3–98.0)
MONO ABS: 0.3 10*3/uL (ref 0.1–0.9)
Monocytes Relative: 8 %
NEUTROS ABS: 2.4 10*3/uL (ref 1.5–6.5)
NEUTROS PCT: 73 %
Platelet Count: 236 10*3/uL (ref 140–400)
RBC: 2.32 MIL/uL — AB (ref 4.20–5.82)
RDW: 27.7 % — AB (ref 11.0–14.6)
WBC Count: 3.2 10*3/uL — ABNORMAL LOW (ref 4.0–10.3)

## 2017-12-27 LAB — CMP (CANCER CENTER ONLY)
ALBUMIN: 2.5 g/dL — AB (ref 3.5–5.0)
ALT: 9 U/L (ref 0–44)
ANION GAP: 7 (ref 5–15)
AST: 11 U/L — ABNORMAL LOW (ref 15–41)
Alkaline Phosphatase: 90 U/L (ref 38–126)
BILIRUBIN TOTAL: 0.2 mg/dL — AB (ref 0.3–1.2)
BUN: 22 mg/dL (ref 8–23)
CO2: 23 mmol/L (ref 22–32)
Calcium: 8.1 mg/dL — ABNORMAL LOW (ref 8.9–10.3)
Chloride: 114 mmol/L — ABNORMAL HIGH (ref 98–111)
Creatinine: 0.8 mg/dL (ref 0.61–1.24)
GFR, Est AFR Am: >60 mL/min (ref >60)
GFR, Estimated: >60 mL/min (ref >60)
GLUCOSE: 82 mg/dL (ref 70–99)
POTASSIUM: 4.3 mmol/L (ref 3.5–5.1)
SODIUM: 144 mmol/L (ref 135–145)
TOTAL PROTEIN: 7.8 g/dL (ref 6.5–8.1)

## 2017-12-27 LAB — SAMPLE TO BLOOD BANK

## 2017-12-27 LAB — PREPARE RBC (CROSSMATCH)

## 2017-12-27 MED ORDER — SODIUM CHLORIDE 0.9% FLUSH
10.0000 mL | INTRAVENOUS | Status: AC | PRN
  Administered 2017-12-27: 10 mL
  Filled 2017-12-27: qty 10

## 2017-12-27 MED ORDER — DIPHENHYDRAMINE HCL 25 MG PO CAPS
ORAL_CAPSULE | ORAL | Status: AC
  Filled 2017-12-27: qty 1

## 2017-12-27 MED ORDER — ACETAMINOPHEN 325 MG PO TABS
650.0000 mg | ORAL_TABLET | Freq: Once | ORAL | Status: AC
  Administered 2017-12-27: 650 mg via ORAL

## 2017-12-27 MED ORDER — HEPARIN SOD (PORK) LOCK FLUSH 100 UNIT/ML IV SOLN
500.0000 [IU] | Freq: Every day | INTRAVENOUS | Status: AC | PRN
  Administered 2017-12-27: 500 [IU]
  Filled 2017-12-27: qty 5

## 2017-12-27 MED ORDER — DIPHENHYDRAMINE HCL 25 MG PO CAPS
25.0000 mg | ORAL_CAPSULE | Freq: Once | ORAL | Status: AC
  Administered 2017-12-27: 25 mg via ORAL

## 2017-12-27 MED ORDER — ACETAMINOPHEN 325 MG PO TABS
ORAL_TABLET | ORAL | Status: AC
  Filled 2017-12-27: qty 2

## 2017-12-27 MED ORDER — SODIUM CHLORIDE 0.9% IV SOLUTION
250.0000 mL | Freq: Once | INTRAVENOUS | Status: AC
  Administered 2017-12-27: 250 mL via INTRAVENOUS
  Filled 2017-12-27: qty 250

## 2017-12-27 NOTE — Patient Instructions (Signed)

## 2017-12-28 LAB — BPAM RBC
BLOOD PRODUCT EXPIRATION DATE: 201909072359
ISSUE DATE / TIME: 201908211134
Unit Type and Rh: 5100

## 2017-12-28 LAB — TYPE AND SCREEN
ABO/RH(D): O POS
Antibody Screen: NEGATIVE
DONOR AG TYPE: NEGATIVE
Unit division: 0

## 2018-01-02 ENCOUNTER — Ambulatory Visit (HOSPITAL_COMMUNITY)
Admission: RE | Admit: 2018-01-02 | Discharge: 2018-01-02 | Disposition: A | Discharge status: Home / Self Care | Payer: Medicare Other | Source: Ambulatory Visit | Attending: Hematology and Oncology | Admitting: Hematology and Oncology

## 2018-01-02 DIAGNOSIS — R918 Other nonspecific abnormal finding of lung field: Secondary | ICD-10-CM | POA: Diagnosis not present

## 2018-01-02 DIAGNOSIS — C3490 Malignant neoplasm of unspecified part of unspecified bronchus or lung: Secondary | ICD-10-CM | POA: Diagnosis not present

## 2018-01-02 DIAGNOSIS — R590 Localized enlarged lymph nodes: Secondary | ICD-10-CM | POA: Insufficient documentation

## 2018-01-02 LAB — GLUCOSE, CAPILLARY: Glucose-Capillary: 80 mg/dL (ref 70–99)

## 2018-01-02 MED ORDER — FLUDEOXYGLUCOSE F - 18 (FDG) INJECTION
5.0200 | Freq: Once | INTRAVENOUS | Status: AC | PRN
  Administered 2018-01-02: 5.02 via INTRAVENOUS

## 2018-01-03 ENCOUNTER — Telehealth: Payer: Self-pay | Admitting: Hematology and Oncology

## 2018-01-03 ENCOUNTER — Inpatient Hospital Stay: Payer: Medicare Other

## 2018-01-03 ENCOUNTER — Inpatient Hospital Stay (HOSPITAL_BASED_OUTPATIENT_CLINIC_OR_DEPARTMENT_OTHER): Payer: Medicare Other | Admitting: Hematology and Oncology

## 2018-01-03 ENCOUNTER — Encounter: Payer: Self-pay | Admitting: Hematology and Oncology

## 2018-01-03 VITALS — BP 115/82 | HR 87 | Temp 97.9°F | Resp 16 | Ht 72.0 in | Wt 102.8 lb

## 2018-01-03 DIAGNOSIS — C3412 Malignant neoplasm of upper lobe, left bronchus or lung: Secondary | ICD-10-CM

## 2018-01-03 DIAGNOSIS — Z923 Personal history of irradiation: Secondary | ICD-10-CM

## 2018-01-03 DIAGNOSIS — D4620 Refractory anemia with excess of blasts, unspecified: Secondary | ICD-10-CM

## 2018-01-03 DIAGNOSIS — D61818 Other pancytopenia: Secondary | ICD-10-CM

## 2018-01-03 DIAGNOSIS — R64 Cachexia: Secondary | ICD-10-CM | POA: Diagnosis not present

## 2018-01-03 DIAGNOSIS — F17210 Nicotine dependence, cigarettes, uncomplicated: Secondary | ICD-10-CM | POA: Diagnosis not present

## 2018-01-03 DIAGNOSIS — Z5112 Encounter for antineoplastic immunotherapy: Secondary | ICD-10-CM | POA: Diagnosis not present

## 2018-01-03 DIAGNOSIS — D469 Myelodysplastic syndrome, unspecified: Secondary | ICD-10-CM

## 2018-01-03 DIAGNOSIS — Z8589 Personal history of malignant neoplasm of other organs and systems: Secondary | ICD-10-CM

## 2018-01-03 DIAGNOSIS — Z7189 Other specified counseling: Secondary | ICD-10-CM

## 2018-01-03 DIAGNOSIS — Z720 Tobacco use: Secondary | ICD-10-CM

## 2018-01-03 LAB — CBC WITH DIFFERENTIAL (CANCER CENTER ONLY)
Basophils Absolute: 0 10*3/uL (ref 0.0–0.1)
Basophils Relative: 1 %
Eosinophils Absolute: 0 10*3/uL (ref 0.0–0.5)
Eosinophils Relative: 1 %
HCT: 28 % — ABNORMAL LOW (ref 38.4–49.9)
HEMOGLOBIN: 9.5 g/dL — AB (ref 13.0–17.1)
LYMPHS ABS: 0.4 10*3/uL — AB (ref 0.9–3.3)
LYMPHS PCT: 7 %
MCH: 34 pg — AB (ref 27.2–33.4)
MCHC: 33.8 g/dL (ref 32.0–36.0)
MCV: 100.5 fL — AB (ref 79.3–98.0)
Monocytes Absolute: 0.4 10*3/uL (ref 0.1–0.9)
Monocytes Relative: 7 %
NEUTROS ABS: 4.7 10*3/uL (ref 1.5–6.5)
NEUTROS PCT: 84 %
Platelet Count: 206 10*3/uL (ref 140–400)
RBC: 2.79 MIL/uL — AB (ref 4.20–5.82)
RDW: 28.6 % — ABNORMAL HIGH (ref 11.0–14.6)
WBC Count: 5.6 10*3/uL (ref 4.0–10.3)

## 2018-01-03 LAB — CMP (CANCER CENTER ONLY)
ALBUMIN: 2.6 g/dL — AB (ref 3.5–5.0)
ALK PHOS: 93 U/L (ref 38–126)
ALT: <6 U/L (ref 0–44)
AST: 12 U/L — ABNORMAL LOW (ref 15–41)
Anion gap: 7 (ref 5–15)
BILIRUBIN TOTAL: 0.4 mg/dL (ref 0.3–1.2)
BUN: 18 mg/dL (ref 8–23)
CALCIUM: 8.6 mg/dL — AB (ref 8.9–10.3)
CO2: 26 mmol/L (ref 22–32)
CREATININE: 0.81 mg/dL (ref 0.61–1.24)
Chloride: 109 mmol/L (ref 98–111)
GFR, Est AFR Am: >60 mL/min (ref >60)
GFR, Estimated: >60 mL/min (ref >60)
GLUCOSE: 80 mg/dL (ref 70–99)
Potassium: 4.2 mmol/L (ref 3.5–5.1)
SODIUM: 142 mmol/L (ref 135–145)
Total Protein: 8.2 g/dL — ABNORMAL HIGH (ref 6.5–8.1)

## 2018-01-03 LAB — SAMPLE TO BLOOD BANK

## 2018-01-03 LAB — TSH: TSH: 0.992 u[IU]/mL (ref 0.320–4.118)

## 2018-01-03 MED ORDER — SODIUM CHLORIDE 0.9% FLUSH
10.0000 mL | Freq: Once | INTRAVENOUS | Status: AC
  Administered 2018-01-03: 10 mL
  Filled 2018-01-03: qty 10

## 2018-01-03 MED ORDER — DARBEPOETIN ALFA 500 MCG/ML IJ SOSY
PREFILLED_SYRINGE | INTRAMUSCULAR | Status: AC
  Filled 2018-01-03: qty 1

## 2018-01-03 MED ORDER — HEPARIN SOD (PORK) LOCK FLUSH 100 UNIT/ML IV SOLN
500.0000 [IU] | Freq: Once | INTRAVENOUS | Status: AC
  Administered 2018-01-03: 500 [IU]
  Filled 2018-01-03: qty 5

## 2018-01-03 MED ORDER — DARBEPOETIN ALFA 500 MCG/ML IJ SOSY
500.0000 ug | PREFILLED_SYRINGE | Freq: Once | INTRAMUSCULAR | Status: AC
  Administered 2018-01-03: 500 ug via SUBCUTANEOUS

## 2018-01-03 NOTE — Assessment & Plan Note (Signed)
Unfortunately, PET CT scan showed disease progression The patient is very frail I do not believe he would tolerate other form of chemotherapy and response rates are expected to be low I recommend consideration for palliative care and hospice The patient and his wife is undecided I plan to see him back again in 2 weeks for further follow-up

## 2018-01-03 NOTE — Patient Instructions (Signed)

## 2018-01-03 NOTE — Assessment & Plan Note (Signed)
He is transfusion dependent He does not need transfusion today We will prescribe darbepoetin injection We discussed the risk and benefits of continuing blood transfusion support in the presence of progressive metastatic cancer I plan to bring him back again in 2 weeks for further follow-up and transfusion as needed

## 2018-01-03 NOTE — Assessment & Plan Note (Signed)
This is likely related to active cancer He will continue mirtazapine

## 2018-01-03 NOTE — Assessment & Plan Note (Signed)
The patient has started to smoke again I reminded him what is at stake and recommend he is quit immediately

## 2018-01-03 NOTE — Assessment & Plan Note (Signed)
We had extensive goals of care discussion in the past The patient has some baseline memory impairment His wife is the dedicated healthcare power of attorney We discussed the roles of palliative care and hospice and the patient and his wife are undecided We will discuss this again in the next visit We discussed poor prognosis, that he will likely succumb to the disease in less than 6 months.

## 2018-01-03 NOTE — Progress Notes (Signed)
[place] OFFICE PROGRESS NOTE  Patient Care Team: [name], MD as PCP - General (Family Medicine) [name], MD as Consulting Physician (Hematology and Oncology)  ASSESSMENT & PLAN:  Cancer of upper lobe of left lung [facility]) Unfortunately, PET CT scan showed disease progression The patient is very frail I do not believe he would tolerate other form of chemotherapy and response rates are expected to be low I recommend consideration for palliative care and hospice The patient and his wife is undecided I plan to see him back again in 2 weeks for further follow-up  Malignant cachexia ([place]) This is likely related to active cancer He will continue mirtazapine  MDS (myelodysplastic syndrome), low grade ([place]) He is transfusion dependent He does not need transfusion today We will prescribe darbepoetin injection We discussed the risk and benefits of continuing blood transfusion support in the presence of progressive metastatic cancer I plan to bring him back again in 2 weeks for further follow-up and transfusion as needed  Tobacco abuse The patient has started to smoke again I reminded him what is at stake and recommend he is quit immediately  Goals of care, counseling/discussion We had extensive goals of care discussion in the past The patient has some baseline memory impairment His wife is the dedicated healthcare power of attorney We discussed the roles of palliative care and hospice and the patient and his wife are undecided We will discuss this again in the next visit We discussed poor prognosis, that he will likely succumb to the disease in less than 6 months.   Orders Placed This Encounter  Procedures  . ARANESP TREATMENT CONDITION    Hold Aranesp: if hemoglobin is >10  . [place] COMMUNICATION LAB    Lab appointment 15 min.  Marland Kitchen SCHEDULING COMMUNICATION INJECTION    Schedule injection appointment 30 min  . PHARMACY COMMUNICATION ONCOLOGY     Aranesp Josem Kaufmann is in under [place] auth:  [id number] Darbepoetin Alfa Authorized 03/08/2017 - 11/08/2017 -- cj 07/17/17    Standing Status:   Standing    Number of Occurrences:   1    INTERVAL HISTORY: Please see below for problem oriented charting. He returns with his wife for further follow-up and review of test results In the last few weeks, he continued to be transfusion dependent and frail He continues to smoke His weight has been stable He has chronic cough but denies hemoptysis No recent infection, fever or chills  SUMMARY OF ONCOLOGIC HISTORY: Oncology History   MDS, R-IPSS score of 3, low risk (Hg 8.9, +16 chromosome on BM, 2% blast count)   Squamous cell carcinoma of the epiglottis   Primary site: Larynx - Supraglottis (Left)   Staging method: AJCC 7th Edition   Clinical: Stage I (T1, N0, M0) signed by [name], MD on 04/30/2013 12:49 PM   Pathologic: Stage I (T1, N0, cM0) signed by [name], MD on 04/30/2013 12:49 PM   Summary: Stage I (T1, N0, cM0)       MDS (myelodysplastic syndrome), low grade ([place])   02/01/2007 Bone Marrow Biopsy    BM biopsy was abnormal, overall probable low grade MDS    03/23/2011 - 02/06/2013 Chemotherapy    [name], discontinued due to diagnosis of laryngeal ca    09/06/2013 Bone Marrow Biopsy    Repeat bone marrow aspirate and biopsy confirmed low-grade myelodysplastic syndrome.    09/17/2013 -  Chemotherapy    The patient resumed darbepoetin injections to treat the anemia.  History of head and neck cancer   03/06/2013 Procedure    Biopsy from epiglottic region showed invasive [facility]    04/25/2013 Imaging    Ct scan showed no other involvement in the LN    05/13/2013 - 06/28/2013 Radiation Therapy    He received radiation therapy, 70 gray in 35 fractions to the larynx    06/09/2016 Imaging    CT scan of the chest showed Bilateral spiculated soft tissue pulmonary masses, highly suspicious for multifocal malignancy. Borderline  enlarged left-sided mediastinal lymphadenopathy. Background of severe emphysematous changes, chronic cylindrical bronchiectasis and hyperinflation of the lungs. Findings consistent with longstanding COPD. Diffusely thickened interstitial markings. This may represent chronic interstitial lung changes, however lymphangitic spread of malignancy cannot be excluded. Anterior compression deformities of several of the thoracic vertebral bodies, with heterogeneous appearance of T7 and T9 vertebral body. This may represent degenerative changes, however osseous metastatic disease cannot be excluded.     07/11/2016 PET scan    There are 2 nodules in the left upper lobe and 2 nodules within the right lower lobe which have spiculated margin and exhibit intense radiotracer uptake compatible with multifocal pulmonary metastasis versus multiple synchronous primary pulmonary neoplasms. 2. Emphysema 3. Aortic atherosclerosis and multi vessel coronary artery calcification.     Cancer of upper lobe of left lung ([place])   06/09/2016 Imaging    CT chest: Bilateral spiculated soft tissue pulmonary masses, highly suspicious for multifocal malignancy. Borderline enlarged left-sided mediastinal lymphadenopathy. Background of severe emphysematous changes, chronic cylindrical bronchiectasis and hyperinflation of the lungs. Findings consistent with longstanding COPD. Diffusely thickened interstitial markings. This may represent chronic interstitial lung changes, however lymphangitic spread of malignancy cannot be excluded. Anterior compression deformities of several of the thoracic vertebral bodies, with heterogeneous appearance of T7 and T9 vertebral body. This may represent degenerative changes, however osseous metastatic disease cannot be excluded.     06/29/2016 Imaging    MRI brain: No evidence of metastatic disease or recent infarction. Advanced generalized brain atrophy with moderate to marked chronic small-vessel ischemic  changes throughout. FLAIR imaging appears to show scattered gyri showing FLAIR hyperintensity. These abnormalities are not confirmed with restricted diffusion or contrast enhancement. Therefore, we are not certain that this is not artifactual. The differential diagnosis does include true pathology such as posterior reversible encephalopathy, viral or prion disease, postictal change in post chemotherapy change. If there is concern about active CNS pathology, re- scanning in 4-6 weeks could be useful.    07/27/2016 Imaging    CT chest: Bilateral pulmonary lesions. These are suspicious for metastatic disease. Lesions have minimally changed but there may be slight enlargement of the cavitary lesion in the right lower lobe. Severe emphysematous changes.     08/10/2016 Pathology Results    1. Lung, biopsy, RUL, (apical segment) - SCANT BENIGN LUNG PARENCHYMA. - THERE IS NO EVIDENCE OF MALIGNANCY. 2. Lung, biopsy, LUL lingula - SQUAMOUS CELL CARCINOMA. - SEE COMMENT. 3. Lung, biopsy, LUL - ADENOCARCINOMA. - SEE COMMENT. Microscopic Comment 2. The malignant cells are positive for cytokeratin 56 and p63. They are negative for TTF-1. The findings are consistent with squamous cell carcinoma. There is likely insufficient tissue remaining for additional studies, if requested. 3. The malignant cells are positive for TTF-1 and negative for p63 and cytokeratin 5/6. The findings are consistent with adenocarcinoma.    08/10/2016 Procedure    The targets were defined as follows: Left upper lobe nodule, target 1 Lingular nodule, target 2 Proximal right lower lobe  nodule, target 3 More distal right lower lobe, nodule  The extendable working channel was secured into place and the locator guide was withdrawn. Under fluoroscopic guidance transbronchial needle brushings, transbronchial Wang needle biopsies, and transbronchial forceps biopsies were performed in the LUL (target 1) and the lingula (target 2) to be  sent for cytology and pathology. A bronchioalveolar lavage was performed in the lingula in the vicinity of target 2  and sent for cytology and microbiology (bacterial, fungal, AFB smears and cultures).  Fiducial markers were then placed around target 1, target 2, and in the right lower lobe in the vicinity of both target 3 and target 4 to be used for possible radiation therapy should this be indicated. At the end of the procedure a general airway inspection was performed and there was no evidence of active bleeding. The bronchoscope was removed.  The patient tolerated the procedure well. There was no significant blood loss and there were no obvious complications. A post-procedural chest x-ray is pending.  Samples: 1. Transbronchial needle brushings from targets 1 and 2 2. Transbronchial Wang needle biopsies from targets 1 and 2 3. Transbronchial forceps biopsies from targets 1 and 2 4. Bronchoalveolar lavage from lingula, target 2 5. Endobronchial biopsies from RUL apical segment 6. Endobronchial brushings from right upper lobe apical segment     08/10/2016 Genetic Testing    Patient has genetic testing done for [employer] and PD-L1 testing Results revealed PD-L1 testing is negative 0%    08/31/2016 Procedure    Technically successful right IJ power-injectable port catheter placement. Ready for routine use    09/21/2016 - 06/08/2017 Chemotherapy    The patient had chemotherapy with carboplatin and Taxol.    11/30/2016 PET scan    1. Interval resection of the hypermetabolic lingular nodule. 2. The 3 other spiculated, hypermetabolic nodules within the left upper and right lower lobes are slightly smaller with decreased metabolic activity, consistent with some response to interval therapy. 3. No progressive disease    03/06/2017 PET scan    1. Mixed appearance, with significant improvement in the left upper lobe nodule ; stable appearance of the more cephalad right lower lobe nodule; but  enlargement and increased in metabolic activity of the more inferior right lower lobe nodule which appears cavitary. No new nodule. 2. Previously there was some moderate increase in diffuse marrow activity. That has increased further, and could be a response from prior granulocyte/marrow stimulation, although I cannot completely exclude the possibility that this is related to the patient's myelodysplastic syndrome. No CT correlate. 3. Other imaging findings of potential clinical significance: Aortic Atherosclerosis (ICD10-I70.0) and Emphysema (ICD10-J43.9). Coronary atherosclerosis. Low-density blood pool suggests anemia. Chronic right upper lobe scarring. Mucus in both mainstem bronchi. Suspected left renal cysts. Indistinct but likely enlarged prostate gland.    04/06/2017 - 04/18/2017 Radiation Therapy    He received SBRT treatment Radiation treatment dates:   04/06/17 - 04/18/17  Site/dose:   Right SBRT Lung// 50 Gy in 5 fx  Beams/energy:   Photon // SRBT/ SBT-3D     06/26/2017 PET scan    1. Clear interval response to therapy of the 2 right lower lobe pulmonary nodules, each decreased in both size and hypermetabolism. 2. Stable size and hypermetabolic activity in the left upper lobe nodule. 3. Diffusely decreased FDG accumulation within the marrow space. 4. Symmetric uptake in both parotid glands is likely physiologic. Given the symmetry, this is most likely physiologic or inflammatory. 5. Prostatomegaly with marked bladder distention.  Component of bladder outlet obstruction suspected. 6. Emphysema. (ICD10-J43.9) 7. Aortic Atherosclerois (ICD10-170.0)    09/25/2017 PET scan    1. New and enlarging small hypermetabolic nodules in the lingula are compatible with metastatic disease. These are in proximity to the linear scarring fiducial markers. 2. New hypermetabolic subcarinal nodal metastasis. 3. Left upper lobe index nodule is stable to slightly progressed in the interval in shows  mild increase in hypermetabolism. 4. No substantial change in the 2 index right lower lobe nodules showing only minimal FDG uptake, as before. 5. Interval progression of patchy airspace opacity in both dependent lower lungs, compatible with infection/inflammation.    10/12/2017 - 12/13/2017 Chemotherapy    The patient had atezolizumab for chemotherapy treatment.     01/02/2018 PET scan    2.8 cm left upper lobe nodule, increased, corresponding to known primary bronchogenic neoplasm.  Two lingular nodules measuring up to 11 mm, mildly increased, suspicious for metastases.  12 mm short axis subcarinal node, worrisome for nodal metastasis, grossly unchanged.  Patchy right lower lobe opacity, suspicious for pneumonia. Mild left basilar opacity, improved.     REVIEW OF SYSTEMS:   Constitutional: Denies fevers, chills or abnormal weight loss Eyes: Denies blurriness of vision Ears, nose, mouth, throat, and face: Denies mucositis or sore throat Cardiovascular: Denies palpitation, chest discomfort or lower extremity swelling Gastrointestinal:  Denies nausea, heartburn or change in bowel habits Skin: Denies abnormal skin rashes Lymphatics: Denies new lymphadenopathy or easy bruising Neurological:Denies numbness, tingling or new weaknesses Behavioral/Psych: Mood is stable, no new changes  All other systems were reviewed with the patient and are negative.  I have reviewed the past medical history, past surgical history, social history and family history with the patient and they are unchanged from previous note.  ALLERGIES:  is allergic to no known allergies.  MEDICATIONS:  Current Outpatient Medications  Medication Sig Dispense Refill  . albuterol (PROVENTIL HFA;VENTOLIN HFA) 108 (90 Base) MCG/ACT inhaler Inhale 2 puffs into the lungs every 4 (four) hours as needed for wheezing or shortness of breath. (Patient not taking: Reported on 11/15/2017) 1 Inhaler 5  . cyanocobalamin 500 MCG tablet Take  500 mcg by mouth daily.    . ferrous sulfate 325 (65 FE) MG tablet Take 325 mg by mouth daily with breakfast.    . folic acid (FOLVITE) 1 MG tablet Take 1 mg by mouth daily.    . Glycopyrrolate-Formoterol (BEVESPI AEROSPHERE) 9-4.8 MCG/ACT AERO Inhale 2 puffs into the lungs 2 (two) times daily. 2 Inhaler 0  . levETIRAcetam (KEPPRA) 500 MG tablet TAKE 1 TABLET BY MOUTH TWICE A DAY 180 tablet 3  . mirtazapine (REMERON) 15 MG tablet Take 1 tablet (15 mg total) by mouth at bedtime. 30 tablet 1  . vitamin E 1000 UNIT capsule Take 1,000 Units by mouth daily.      No current facility-administered medications for this visit.     PHYSICAL EXAMINATION: ECOG PERFORMANCE STATUS: 2 - Symptomatic, <50% confined to bed  Vitals:   01/03/18 1121  BP: 115/82  Pulse: 87  Resp: 16  Temp: 97.9 F (36.6 C)  SpO2: 100%   Filed Weights   01/03/18 1121  Weight: 102 lb 12.8 oz (46.6 kg)    GENERAL:alert, no distress and comfortable.  He looks thin and cachectic Musculoskeletal:no cyanosis of digits and no clubbing  NEURO: alert & oriented x 3 with fluent speech, no focal motor/sensory deficits  LABORATORY DATA:  I have reviewed the data as listed  Component Value Date/Time   NA 142 01/03/2018 1036   NA 142 05/03/2017 1114   K 4.2 01/03/2018 1036   K 4.5 05/03/2017 1114   CL 109 01/03/2018 1036   CL 107 09/19/2012 1325   CO2 26 01/03/2018 1036   CO2 21 (L) 05/03/2017 1114   GLUCOSE 80 01/03/2018 1036   GLUCOSE 88 05/03/2017 1114   GLUCOSE 97 09/19/2012 1325   BUN 18 01/03/2018 1036   BUN 18.5 05/03/2017 1114   CREATININE 0.81 01/03/2018 1036   CREATININE 0.9 05/03/2017 1114   CALCIUM 8.6 (L) 01/03/2018 1036   CALCIUM 8.9 05/03/2017 1114   PROT 8.2 (H) 01/03/2018 1036   PROT 8.6 (H) 05/03/2017 1114   ALBUMIN 2.6 (L) 01/03/2018 1036   ALBUMIN 3.2 (L) 05/03/2017 1114   AST 12 (L) 01/03/2018 1036   AST 9 05/03/2017 1114   ALT <6 01/03/2018 1036   ALT <6 05/03/2017 1114   ALKPHOS 93  01/03/2018 1036   ALKPHOS 126 05/03/2017 1114   BILITOT 0.4 01/03/2018 1036   BILITOT 0.34 05/03/2017 1114   GFRNONAA >60 01/03/2018 1036   GFRAA >60 01/03/2018 1036    No results found for: SPEP, UPEP  Lab Results  Component Value Date   WBC 5.6 01/03/2018   NEUTROABS 4.7 01/03/2018   HGB 9.5 (L) 01/03/2018   HCT 28.0 (L) 01/03/2018   MCV 100.5 (H) 01/03/2018   PLT 206 01/03/2018      Chemistry      Component Value Date/Time   NA 142 01/03/2018 1036   NA 142 05/03/2017 1114   K 4.2 01/03/2018 1036   K 4.5 05/03/2017 1114   CL 109 01/03/2018 1036   CL 107 09/19/2012 1325   CO2 26 01/03/2018 1036   CO2 21 (L) 05/03/2017 1114   BUN 18 01/03/2018 1036   BUN 18.5 05/03/2017 1114   CREATININE 0.81 01/03/2018 1036   CREATININE 0.9 05/03/2017 1114      Component Value Date/Time   CALCIUM 8.6 (L) 01/03/2018 1036   CALCIUM 8.9 05/03/2017 1114   ALKPHOS 93 01/03/2018 1036   ALKPHOS 126 05/03/2017 1114   AST 12 (L) 01/03/2018 1036   AST 9 05/03/2017 1114   ALT <6 01/03/2018 1036   ALT <6 05/03/2017 1114   BILITOT 0.4 01/03/2018 1036   BILITOT 0.34 05/03/2017 1114       RADIOGRAPHIC STUDIES: I have reviewed multiple imaging study with the patient and his wife I have personally reviewed the radiological images as listed and agreed with the findings in the report. Nm Pet Image Restag (ps) Skull Base To Thigh  Result Date: 01/02/2018 CLINICAL DATA:  Subsequent treatment strategy for non-small cell lung cancer. EXAM: NUCLEAR MEDICINE PET SKULL BASE TO THIGH TECHNIQUE: 5.02 mCi F-18 FDG was injected intravenously. Full-ring PET imaging was performed from the skull base to thigh after the radiotracer. CT data was obtained and used for attenuation correction and anatomic localization. Fasting blood glucose: 80 mg/dl COMPARISON:  09/25/2017 FINDINGS: Mediastinal blood pool activity: SUV max 1.67 NECK: No hypermetabolic cervical lymphadenopathy. Hypermetabolism in the left  supraclavicular region is favored to reflect physiologic muscular activity. Incidental CT findings: none CHEST: 2.8 x 2.3 cm left upper lobe nodule (series 8/image 26), previously 2.2 x 2.2 cm, max SUV 8.2, previously 5.7. Two additional lingular nodules measuring up to 11 mm (series 8/images 52 and 59), previously 8 mm, max SUV 5.5, previously 5.4. Multifocal patchy opacities in the medial right lower lobe (series 8/image  17), max SUV 7.0, favoring infection. Mild patchy left basilar opacity, without associated hypermetabolism, improved. 12 mm subcarinal node (series 4/image 66), max SUV 4.5, previously 11 mm with max SUV 5.6. Incidental CT findings: Right apical pleural-parenchymal scarring. Moderate centrilobular and paraseptal emphysematous changes. Atherosclerotic calcifications of the aortic arch. Three vessel coronary atherosclerosis. Right chest port terminating at the cavoatrial junction. ABDOMEN/PELVIS: No abnormal hypermetabolism in the liver, spleen, pancreas, or adrenal glands. No hypermetabolic lymphadenopathy in the abdomen/pelvis. Atherosclerotic calcifications the abdominal aorta and branch vessels. Renal vascular calcifications. 2.5 cm left upper pole renal sinus cyst versus calyceal diverticulum. Additional left lower pole renal cyst. Distended bladder. Incidental CT findings: none SKELETON: No focal hypermetabolic activity to suggest skeletal metastasis. Incidental CT findings: Degenerative changes of the visualized thoracolumbar spine. IMPRESSION: 2.8 cm left upper lobe nodule, increased, corresponding to known primary bronchogenic neoplasm. Two lingular nodules measuring up to 11 mm, mildly increased, suspicious for metastases. 12 mm short axis subcarinal node, worrisome for nodal metastasis, grossly unchanged. Patchy right lower lobe opacity, suspicious for pneumonia. Mild left basilar opacity, improved. Electronically Signed   By: Julian Hy M.D.   On: 01/02/2018 16:42    All  questions were answered. The patient knows to call the clinic with any problems, questions or concerns. No barriers to learning was detected.  I spent 30 minutes counseling the patient face to face. The total time spent in the appointment was 40 minutes and more than 50% was on counseling and review of test results  [name], MD 01/03/2018 2:53 PM

## 2018-01-03 NOTE — Telephone Encounter (Signed)
Gave avs and calendar had to move appt over a day

## 2018-01-10 ENCOUNTER — Ambulatory Visit: Payer: Self-pay

## 2018-01-10 ENCOUNTER — Encounter: Payer: Self-pay | Admitting: Nutrition

## 2018-01-10 ENCOUNTER — Other Ambulatory Visit: Payer: Self-pay | Admitting: Hematology and Oncology

## 2018-01-10 ENCOUNTER — Other Ambulatory Visit: Payer: Self-pay

## 2018-01-15 ENCOUNTER — Ambulatory Visit (INDEPENDENT_AMBULATORY_CARE_PROVIDER_SITE_OTHER): Payer: Medicare Other | Admitting: Neurology

## 2018-01-15 ENCOUNTER — Encounter: Payer: Self-pay | Admitting: Neurology

## 2018-01-15 VITALS — BP 118/62 | HR 64 | Ht 72.0 in | Wt 103.1 lb

## 2018-01-15 DIAGNOSIS — R569 Unspecified convulsions: Secondary | ICD-10-CM | POA: Diagnosis not present

## 2018-01-15 MED ORDER — LEVETIRACETAM 500 MG PO TABS: 500.0000 mg | ORAL_TABLET | Freq: Two times a day (BID) | ORAL | 3 refills | Status: AC

## 2018-01-15 NOTE — Patient Instructions (Signed)
I had a long discussion with the patient and family about his remote seizure which appear to be quite stable. Continue Keppra 500 mg twice daily which is tolerating well without side effects. He was given a refill for a year.Patient `s mild cognitive impairment also appears stable. I encouraged him to be active in mentally challenging activities. He will return for follow-up in a year or call earlier if necessary

## 2018-01-15 NOTE — Progress Notes (Signed)
[facility] [address] [place]. [place] [address] [phone]       OFFICE FOLLOW UP VISIT NOTE  [name] Date of Birth:  1950-08-22 Medical Record Number:  [id number]   Referring MD:  [name]  Reason for Referral:  seizure  HPI: 67 year male with history of seizures since 30 years which were apparently called alcohol related either due to intoxication or withdrawal. He has been on Dilantin since a long time and he could tell of previous stroke. He has myelodysplastic syndrome with anemia. It is suspected that chronic Dilantin usage may have contributed to his bone marrow suppression fan primary care physician has raised the question as to whether he can come off anticonvulsants since he has been seizure-free for a long time. He denies history of head injury, loss of consciousness, febrile illness for unprovoked seizures. He does not remember any unprovoked seizures which was nonl alcohol related. There is no family history of seizures or epilepsy. UPDATE 03/05/2014 : he returns for followup of her last visit 4 months ago. He has reduced her Dilantin to 300 mg daily without any breakthrough seizures. He continues to have a mild short term memory difficulties which appear to be unchanged. He still independent in most activities of daily living except he does not drive. He has not had any new neurological complaints. He underwent lab work on 12/25/13 which showed vitamin B12, TSH to be normal and RPR to be nonreactive. MRI scan of the brain personally reviewed by me done on 01/30/14 shows moderate changes of generalized cerebral atrophy and mild changes of chronic microvascular ischemia and paranasal sinusitis. EEG done on 12/25/13 personally reviewed by me is normal without any epileptiform features . Update 07/09/2014 ; he returns for follow-up after last visit 2 months ago. He is accompanied by his wife and stated that he had a generalized tonic-clonic seizure 2  weeks ago when he was on a tapering dose of Dilantin of 100 mg daily. He was seen in the ER where he was loaded with Dilantin but subsequently Dr. Jannifer Franklin changed him to Cool 500 twice daily as the plan was to discontinue Dilantin since hematologist felt that it was contributing to his myelodysplasia. Patient has noted some irritable mood and has not felt as alert and wife feels this may be effect of Keppra. However he has been noted for less than 2 weeks. He feels his memory difficulties are unchanged. He scored 23/30 on the Mini-Mental today. Update 01/07/2015 : He returns for follow-up after last visit 6 months ago accompanied by his wife. He is doing well without recurrent seizures now. He is tolerating Keppra better and his irritability and mood changes at the beginning have now settled down. He has had no breakthrough seizures. He had EEG done on 07/16/2014 which I personally reviewed and was normal. He states his memory and come to difficulties remain unchanged. He is independent in activities of daily living and does not need any help at home. He has no new complaints. He is not part sparing in any cognitively challenging activities. Update 01/07/2016 : He returns for follow-up after last visit a year ago. He continues to do well and has been seizure-free now since 2008. He starting Keppra well without dizziness or any side effects. He has had no new interval health problems. Continues to have mild short-term memory difficulties which are unchanged. He still independent in activities of daily living. He is not part sparing in regular activities  for mentally challenging tasks like playing bridge crossword puzzles. He has no new complaints today. Update 01/04/2017 : He returns for follow-up after last visit a year ago. He is accompanied by his wife. He has not had any breakthrough seizures since 2008. He remains on Keppra 500 mg twice daily which is tolerating it well without any side effects. He continues to  have mild short-term memory difficulties which appear to be unchanged. He still independent in activities of daily living. He unfortunately has been diagnosed with lung cancer in March of this year and is currently on chemotherapy which he seems to be tolerating all right so far. Update 01/15/2018 : he returns for follow-up after last visit a year ago. She continues to do well and has not had any seizures since 2008. He is tolerating Keppra well without any side effects. He was on chemotherapy for his lung cancer which was switched to immunotherapy and he just finished his last treatment. His memory loss and cognitive impairment appeared to be unchanged. He does have poor short-term memory is living at home with family and there have been no unsafe behaviors, delusions or hallucinations noted. ROS:   14 system review of systems is positive for memory loss , cough, shortness of breath cold intolerance   and all of the systems negative  PMH:  Past Medical History:  Diagnosis Date  . Anxiety   . Arthritis    rheumatoid  . Cancer ([place]) 03/11/2013   larnyx/epiglottic Squamous cell in situ  . Emphysema of lung ([place])   . Esophageal reflux    not current  . History of blood transfusion   . History of radiation therapy 05/13/2013-06/28/2013   70 gray to epiglottis/neck  . History of radiation therapy 04/06/17-04/18/17   right SBRT lung 50 Gy in 5 fractions  . Megaloblastic anemia   . Pneumonia 01/2013  . Seizures ([place])    last one was 8 years ago- 2008  . Shortness of breath    with exertion  . Throat pain 06/13/2013    Social History:  Social History   Socioeconomic History  . Marital status: Married    Spouse name: Hassan Rowan  . Number of children: 3  . Years of education: 12th  . Highest education level: Not on file  Occupational History  . Occupation: Retired    Fish farm manager: [employer]  Social Needs  . Financial resource strain: Not on file  . Food insecurity:    Worry: Not on file    Inability:  Not on file  . Transportation needs:    Medical: Not on file    Non-medical: Not on file  Tobacco Use  . Smoking status: Former Smoker    Packs/day: 0.12    Years: 40.00    Pack years: 4.80    Types: Cigarettes    Last attempt to quit: 10/04/2016    Years since quitting: 1.2  . Smokeless tobacco: Never Used  . Tobacco comment: smoke 1 to 2 a day  Substance and Sexual Activity  . Alcohol use: No    Alcohol/week: 0.0 standard drinks  . Drug use: No  . Sexual activity: Not on file  Lifestyle  . Physical activity:    Days per week: Not on file    Minutes per session: Not on file  . Stress: Not on file  Relationships  . Social connections:    Talks on phone: Not on file    Gets together: Not on file    Attends religious  service: Not on file    Active member of club or organization: Not on file    Attends meetings of clubs or organizations: Not on file    Relationship status: Not on file  . Intimate partner violence:    Fear of current or ex partner: Not on file    Emotionally abused: Not on file    Physically abused: Not on file    Forced sexual activity: Not on file  Other Topics Concern  . Not on file  Social History Narrative   04/12/2013   The patient is married with 3 children. (2 girls and one boy)    The patient has a history of smoking a quarter pack per day for 15 years.   The patient is trying to quit on his own.   Patient has not had any alcohol for the past 6 months by report. Patient usually drinks vodka.   Caffeine Use: 2 cups daily             Medications:   Current Outpatient Medications on File Prior to Visit  Medication Sig Dispense Refill  . albuterol (PROVENTIL HFA;VENTOLIN HFA) 108 (90 Base) MCG/ACT inhaler Inhale 2 puffs into the lungs every 4 (four) hours as needed for wheezing or shortness of breath. 1 Inhaler 5  . cyanocobalamin 500 MCG tablet Take 500 mcg by mouth daily.    . ferrous sulfate 325 (65 FE) MG tablet Take 325 mg by mouth daily  with breakfast.    . folic acid (FOLVITE) 1 MG tablet Take 1 mg by mouth daily.    . Glycopyrrolate-Formoterol (BEVESPI AEROSPHERE) 9-4.8 MCG/ACT AERO Inhale 2 puffs into the lungs 2 (two) times daily. 2 Inhaler 0  . mirtazapine (REMERON) 15 MG tablet TAKE 1 TABLET BY MOUTH EVERYDAY AT BEDTIME 30 tablet 1  . vitamin E 1000 UNIT capsule Take 1,000 Units by mouth daily.      No current facility-administered medications on file prior to visit.     Allergies:   Allergies  Allergen Reactions  . No Known Allergies     Physical Exam General: Frail middle-age African-American male, seated, in no evident distress Head: head normocephalic and atraumatic.     Neck: supple with no carotid or supraclavicular bruits Cardiovascular: regular rate and rhythm, no murmurs Musculoskeletal: no deformity Skin:  no rash/petichiae. Chronic fungus infection left hand fingernails with dystrophic nails. Mild clubbing of the nail with conjunctival pallor Vascular:  Normal pulses all extremities Vitals:   01/15/18 1039  BP: 118/62  Pulse: 64    Neurologic Exam Mental Status: Awake and fully alert. Oriented to place and time. Recent and remote memory intact. Attention span, concentration and fund of knowledge appropriate. Mood and affect appropriate. Mini-Mental status exam not done today.Recall diminished 1/3. Animal naming 8 only.   Cranial Nerves: Fundoscopic exam not done . Pupils equal, briskly reactive to light. Extraocular movements full without nystagmus. Visual fields full to confrontation. Hearing intact. Facial sensation intact. Face, tongue, palate moves normally and symmetrically.  Motor: Normal bulk and tone. Normal strength in all tested extremity muscles. Sensory.: intact to tough and pinprick and vibratory.  Coordination: Rapid alternating movements normal in all extremities. Finger-to-nose and heel-to-shin performed accurately bilaterally. Gait and Station: Arises from chair without  difficulty. Stance is normal. Gait demonstrates normal stride length and balance . Able to heel, toe and tandem walk with mild  difficulty.  Reflexes: 1+ and symmetric. Toes downgoing.    ASSESSMENT: 67 year remote history  of seizures since 30 years has been seizure-free since 2008. Most of the seizures appear to have been alcohol related.He also has a mild cognitive impairment which appears stable.     PLAN: I had a long discussion the patient and his wife with regards to his remote seizures which appear quite stable and has been seizure-free now for 7 years. Continue Keppra 500 mg twice daily is tolerating it well without side effects. He was given a refill to last a year. His mild cognitive impairment also appears to be stable. I again encouraged him to increase participation in cognitively challenging activities like solving crossword puzzles, playing bridge and sudoku. He was advised to continue his chemotherapy for lung cancer as per his oncologist. He will return for follow-up in a year or call earlier if necessary..Greater than 50% time during this 25 minute visit was spent on counseling and coordination of caliber is remote seizures and mild cognitive impairment and answering questions   Antony Contras, MD Note: This document was prepared with digital dictation and possible smart phrase technology. Any transcriptional errors that result from this process are unintentional.

## 2018-01-18 ENCOUNTER — Inpatient Hospital Stay: Payer: Medicare Other | Attending: Hematology and Oncology

## 2018-01-18 ENCOUNTER — Encounter: Payer: Self-pay | Admitting: Hematology and Oncology

## 2018-01-18 ENCOUNTER — Inpatient Hospital Stay: Payer: Medicare Other

## 2018-01-18 ENCOUNTER — Inpatient Hospital Stay (HOSPITAL_BASED_OUTPATIENT_CLINIC_OR_DEPARTMENT_OTHER): Payer: Medicare Other | Admitting: Hematology and Oncology

## 2018-01-18 ENCOUNTER — Telehealth: Payer: Self-pay | Admitting: Hematology and Oncology

## 2018-01-18 DIAGNOSIS — C3412 Malignant neoplasm of upper lobe, left bronchus or lung: Secondary | ICD-10-CM

## 2018-01-18 DIAGNOSIS — D4620 Refractory anemia with excess of blasts, unspecified: Secondary | ICD-10-CM

## 2018-01-18 DIAGNOSIS — Z66 Do not resuscitate: Secondary | ICD-10-CM | POA: Diagnosis not present

## 2018-01-18 DIAGNOSIS — D4621 Refractory anemia with excess of blasts 1: Secondary | ICD-10-CM | POA: Diagnosis not present

## 2018-01-18 DIAGNOSIS — Z8589 Personal history of malignant neoplasm of other organs and systems: Secondary | ICD-10-CM

## 2018-01-18 DIAGNOSIS — Z79899 Other long term (current) drug therapy: Secondary | ICD-10-CM | POA: Diagnosis not present

## 2018-01-18 DIAGNOSIS — I700 Atherosclerosis of aorta: Secondary | ICD-10-CM | POA: Insufficient documentation

## 2018-01-18 DIAGNOSIS — I2510 Atherosclerotic heart disease of native coronary artery without angina pectoris: Secondary | ICD-10-CM

## 2018-01-18 DIAGNOSIS — Z9221 Personal history of antineoplastic chemotherapy: Secondary | ICD-10-CM | POA: Insufficient documentation

## 2018-01-18 DIAGNOSIS — J449 Chronic obstructive pulmonary disease, unspecified: Secondary | ICD-10-CM

## 2018-01-18 DIAGNOSIS — Z7189 Other specified counseling: Secondary | ICD-10-CM

## 2018-01-18 DIAGNOSIS — D649 Anemia, unspecified: Secondary | ICD-10-CM

## 2018-01-18 DIAGNOSIS — R64 Cachexia: Secondary | ICD-10-CM | POA: Diagnosis not present

## 2018-01-18 DIAGNOSIS — Z923 Personal history of irradiation: Secondary | ICD-10-CM | POA: Diagnosis not present

## 2018-01-18 DIAGNOSIS — F17210 Nicotine dependence, cigarettes, uncomplicated: Secondary | ICD-10-CM | POA: Insufficient documentation

## 2018-01-18 DIAGNOSIS — Z23 Encounter for immunization: Secondary | ICD-10-CM | POA: Diagnosis not present

## 2018-01-18 DIAGNOSIS — D61818 Other pancytopenia: Secondary | ICD-10-CM

## 2018-01-18 LAB — CBC WITH DIFFERENTIAL (CANCER CENTER ONLY)
BASOS PCT: 1 %
Basophils Absolute: 0 10*3/uL (ref 0.0–0.1)
EOS PCT: 1 %
Eosinophils Absolute: 0 10*3/uL (ref 0.0–0.5)
HEMATOCRIT: 26.3 % — AB (ref 38.4–49.9)
Hemoglobin: 8.6 g/dL — ABNORMAL LOW (ref 13.0–17.1)
Lymphocytes Relative: 12 %
Lymphs Abs: 0.4 10*3/uL — ABNORMAL LOW (ref 0.9–3.3)
MCH: 33.8 pg — ABNORMAL HIGH (ref 27.2–33.4)
MCHC: 32.7 g/dL (ref 32.0–36.0)
MCV: 103.3 fL — ABNORMAL HIGH (ref 79.3–98.0)
MONO ABS: 0.3 10*3/uL (ref 0.1–0.9)
MONOS PCT: 9 %
NEUTROS ABS: 2.8 10*3/uL (ref 1.5–6.5)
Neutrophils Relative %: 77 %
PLATELETS: 258 10*3/uL (ref 140–400)
RBC: 2.55 MIL/uL — ABNORMAL LOW (ref 4.20–5.82)
RDW: 28.7 % — AB (ref 11.0–14.6)
WBC Count: 3.6 10*3/uL — ABNORMAL LOW (ref 4.0–10.3)

## 2018-01-18 LAB — CMP (CANCER CENTER ONLY)
ALBUMIN: 2.7 g/dL — AB (ref 3.5–5.0)
ALK PHOS: 92 U/L (ref 38–126)
ALT: <6 U/L (ref 0–44)
ANION GAP: 7 (ref 5–15)
AST: 10 U/L — ABNORMAL LOW (ref 15–41)
BUN: 19 mg/dL (ref 8–23)
CALCIUM: 8.7 mg/dL — AB (ref 8.9–10.3)
CO2: 24 mmol/L (ref 22–32)
CREATININE: 0.85 mg/dL (ref 0.61–1.24)
Chloride: 105 mmol/L (ref 98–111)
GLUCOSE: 107 mg/dL — AB (ref 70–99)
Potassium: 4.7 mmol/L (ref 3.5–5.1)
Sodium: 136 mmol/L (ref 135–145)
TOTAL PROTEIN: 8.5 g/dL — AB (ref 6.5–8.1)
Total Bilirubin: 0.4 mg/dL (ref 0.3–1.2)

## 2018-01-18 LAB — SAMPLE TO BLOOD BANK

## 2018-01-18 MED ORDER — DARBEPOETIN ALFA 500 MCG/ML IJ SOSY
PREFILLED_SYRINGE | INTRAMUSCULAR | Status: AC
  Filled 2018-01-18: qty 1

## 2018-01-18 MED ORDER — HEPARIN SOD (PORK) LOCK FLUSH 100 UNIT/ML IV SOLN
500.0000 [IU] | Freq: Once | INTRAVENOUS | Status: AC
  Administered 2018-01-18: 500 [IU]
  Filled 2018-01-18: qty 5

## 2018-01-18 MED ORDER — DARBEPOETIN ALFA 500 MCG/ML IJ SOSY
500.0000 ug | PREFILLED_SYRINGE | Freq: Once | INTRAMUSCULAR | Status: AC
  Administered 2018-01-18: 500 ug via SUBCUTANEOUS

## 2018-01-18 MED ORDER — MIRTAZAPINE 30 MG PO TABS: 30.0000 mg | ORAL_TABLET | Freq: Every day | ORAL | 11 refills | Status: DC

## 2018-01-18 MED ORDER — INFLUENZA VAC SPLIT QUAD 0.5 ML IM SUSY
0.5000 mL | PREFILLED_SYRINGE | Freq: Once | INTRAMUSCULAR | Status: AC
  Administered 2018-01-18: 0.5 mL via INTRAMUSCULAR

## 2018-01-18 MED ORDER — INFLUENZA VAC SPLIT HIGH-DOSE 0.5 ML IM SUSY
0.5000 mL | PREFILLED_SYRINGE | Freq: Once | INTRAMUSCULAR | Status: DC
  Filled 2018-01-18: qty 0.5

## 2018-01-18 MED ORDER — SODIUM CHLORIDE 0.9% FLUSH
10.0000 mL | Freq: Once | INTRAVENOUS | Status: AC
  Administered 2018-01-18: 10 mL
  Filled 2018-01-18: qty 10

## 2018-01-18 NOTE — Telephone Encounter (Signed)
Gave patient avs and calendar.  Patient needed after 10:00 am and before 2 for arrival times.

## 2018-01-18 NOTE — Progress Notes (Signed)
[facility] OFFICE PROGRESS NOTE  Patient Care Team: [name], MD as PCP - General (Family Medicine) [name], MD as Consulting Physician (Hematology and Oncology)  ASSESSMENT & PLAN:  Cancer of upper lobe of left lung ([place]) I explained to the patient and family members again the rationale of not pursuing further palliative chemotherapy due to his frail status and refractory to multiple treatment I recommend we focus on supportive care only They agree with the plan of care We discussed the importance of preventive care and reviewed the vaccination programs. He does not have any prior allergic reactions to influenza vaccination. He agrees to proceed with influenza vaccination today and we will administer it today at the clinic.   MDS (myelodysplastic syndrome), low grade ([place]) He is transfusion dependent He does not need transfusion today We will prescribe darbepoetin injection, to keep hemoglobin greater than 10 We discussed the risk and benefits of continuing blood transfusion support in the presence of progressive metastatic cancer If his hemoglobin is less than 8, he will receive a unit of blood transfusion. I plan to bring him back again in 2 weeks for further follow-up and transfusion as needed  Malignant cachexia ([place]) He is profoundly cachectic I recommend increasing the dose of Remeron to 30 mg and he agreed to proceed.  Goals of care, counseling/discussion We had extensive goals of care discussion in the past The patient has some baseline memory impairment His wife is the dedicated healthcare power of attorney We discussed poor prognosis, that he will likely succumb to the disease in less than 6 months. After extensive discussion, the patient and family members are agreed to DNR I gave him a DNR order I will refer him to care connection/palliative care service to establish care Due to ongoing need for blood transfusion and injections, he is  currently not a hospice patient yet.   No orders of the defined types were placed in this encounter.   INTERVAL HISTORY: Please see below for problem oriented charting. He returns with the daughter and wife for further discussion about plan of care Since last time I saw him, he gets progressively weaker He has lost weight He continues to have nonproductive cough The patient denies any recent signs or symptoms of bleeding such as spontaneous epistaxis, hematuria or hematochezia. He denies other new symptoms.  SUMMARY OF ONCOLOGIC HISTORY: Oncology History   MDS, R-IPSS score of 3, low risk (Hg 8.9, +16 chromosome on BM, 2% blast count)   Squamous cell carcinoma of the epiglottis   Primary site: Larynx - Supraglottis (Left)   Staging method: AJCC 7th Edition   Clinical: Stage I (T1, N0, M0) signed by [name], MD on 04/30/2013 12:49 PM   Pathologic: Stage I (T1, N0, cM0) signed by [name], MD on 04/30/2013 12:49 PM   Summary: Stage I (T1, N0, cM0)       MDS (myelodysplastic syndrome), low grade ([place])   02/01/2007 Bone Marrow Biopsy    BM biopsy was abnormal, overall probable low grade MDS    03/23/2011 - 02/06/2013 Chemotherapy    [name], discontinued due to diagnosis of laryngeal ca    09/06/2013 Bone Marrow Biopsy    Repeat bone marrow aspirate and biopsy confirmed low-grade myelodysplastic syndrome.    09/17/2013 -  Chemotherapy    The patient resumed darbepoetin injections to treat the anemia.     History of head and neck cancer   03/06/2013 Procedure    Biopsy from  epiglottic region showed invasive SCC    04/25/2013 Imaging    Ct scan showed no other involvement in the LN    05/13/2013 - 06/28/2013 Radiation Therapy    He received radiation therapy, 70 gray in 35 fractions to the larynx    06/09/2016 Imaging    CT scan of the chest showed Bilateral spiculated soft tissue pulmonary masses, highly suspicious for multifocal malignancy. Borderline  enlarged left-sided mediastinal lymphadenopathy. Background of severe emphysematous changes, chronic cylindrical bronchiectasis and hyperinflation of the lungs. Findings consistent with longstanding COPD. Diffusely thickened interstitial markings. This may represent chronic interstitial lung changes, however lymphangitic spread of malignancy cannot be excluded. Anterior compression deformities of several of the thoracic vertebral bodies, with heterogeneous appearance of T7 and T9 vertebral body. This may represent degenerative changes, however osseous metastatic disease cannot be excluded.     07/11/2016 PET scan    There are 2 nodules in the left upper lobe and 2 nodules within the right lower lobe which have spiculated margin and exhibit intense radiotracer uptake compatible with multifocal pulmonary metastasis versus multiple synchronous primary pulmonary neoplasms. 2. Emphysema 3. Aortic atherosclerosis and multi vessel coronary artery calcification.     Cancer of upper lobe of left lung ([place])   06/09/2016 Imaging    CT chest: Bilateral spiculated soft tissue pulmonary masses, highly suspicious for multifocal malignancy. Borderline enlarged left-sided mediastinal lymphadenopathy. Background of severe emphysematous changes, chronic cylindrical bronchiectasis and hyperinflation of the lungs. Findings consistent with longstanding COPD. Diffusely thickened interstitial markings. This may represent chronic interstitial lung changes, however lymphangitic spread of malignancy cannot be excluded. Anterior compression deformities of several of the thoracic vertebral bodies, with heterogeneous appearance of T7 and T9 vertebral body. This may represent degenerative changes, however osseous metastatic disease cannot be excluded.     06/29/2016 Imaging    MRI brain: No evidence of metastatic disease or recent infarction. Advanced generalized brain atrophy with moderate to marked chronic small-vessel ischemic  changes throughout. FLAIR imaging appears to show scattered gyri showing FLAIR hyperintensity. These abnormalities are not confirmed with restricted diffusion or contrast enhancement. Therefore, we are not certain that this is not artifactual. The differential diagnosis does include true pathology such as posterior reversible encephalopathy, viral or prion disease, postictal change in post chemotherapy change. If there is concern about active CNS pathology, re- scanning in 4-6 weeks could be useful.    07/27/2016 Imaging    CT chest: Bilateral pulmonary lesions. These are suspicious for metastatic disease. Lesions have minimally changed but there may be slight enlargement of the cavitary lesion in the right lower lobe. Severe emphysematous changes.     08/10/2016 Pathology Results    1. Lung, biopsy, RUL, (apical segment) - SCANT BENIGN LUNG PARENCHYMA. - THERE IS NO EVIDENCE OF MALIGNANCY. 2. Lung, biopsy, LUL lingula - SQUAMOUS CELL CARCINOMA. - SEE COMMENT. 3. Lung, biopsy, LUL - ADENOCARCINOMA. - SEE COMMENT. Microscopic Comment 2. The malignant cells are positive for cytokeratin 56 and p63. They are negative for TTF-1. The findings are consistent with squamous cell carcinoma. There is likely insufficient tissue remaining for additional studies, if requested. 3. The malignant cells are positive for TTF-1 and negative for p63 and cytokeratin 5/6. The findings are consistent with adenocarcinoma.    08/10/2016 Procedure    The targets were defined as follows: Left upper lobe nodule, target 1 Lingular nodule, target 2 Proximal right lower lobe nodule, target 3 More distal right lower lobe, nodule  The extendable working channel was  secured into place and the locator guide was withdrawn. Under fluoroscopic guidance transbronchial needle brushings, transbronchial Wang needle biopsies, and transbronchial forceps biopsies were performed in the LUL (target 1) and the lingula (target 2) to be  sent for cytology and pathology. A bronchioalveolar lavage was performed in the lingula in the vicinity of target 2  and sent for cytology and microbiology (bacterial, fungal, AFB smears and cultures).  Fiducial markers were then placed around target 1, target 2, and in the right lower lobe in the vicinity of both target 3 and target 4 to be used for possible radiation therapy should this be indicated. At the end of the procedure a general airway inspection was performed and there was no evidence of active bleeding. The bronchoscope was removed.  The patient tolerated the procedure well. There was no significant blood loss and there were no obvious complications. A post-procedural chest x-ray is pending.  Samples: 1. Transbronchial needle brushings from targets 1 and 2 2. Transbronchial Wang needle biopsies from targets 1 and 2 3. Transbronchial forceps biopsies from targets 1 and 2 4. Bronchoalveolar lavage from lingula, target 2 5. Endobronchial biopsies from RUL apical segment 6. Endobronchial brushings from right upper lobe apical segment     08/10/2016 Genetic Testing    Patient has genetic testing done for [employer] and PD-L1 testing Results revealed PD-L1 testing is negative 0%    08/31/2016 Procedure    Technically successful right IJ power-injectable port catheter placement. Ready for routine use    09/21/2016 - 06/08/2017 Chemotherapy    The patient had chemotherapy with carboplatin and Taxol.    11/30/2016 PET scan    1. Interval resection of the hypermetabolic lingular nodule. 2. The 3 other spiculated, hypermetabolic nodules within the left upper and right lower lobes are slightly smaller with decreased metabolic activity, consistent with some response to interval therapy. 3. No progressive disease    03/06/2017 PET scan    1. Mixed appearance, with significant improvement in the left upper lobe nodule ; stable appearance of the more cephalad right lower lobe nodule; but  enlargement and increased in metabolic activity of the more inferior right lower lobe nodule which appears cavitary. No new nodule. 2. Previously there was some moderate increase in diffuse marrow activity. That has increased further, and could be a response from prior granulocyte/marrow stimulation, although I cannot completely exclude the possibility that this is related to the patient's myelodysplastic syndrome. No CT correlate. 3. Other imaging findings of potential clinical significance: Aortic Atherosclerosis (ICD10-I70.0) and Emphysema (ICD10-J43.9). Coronary atherosclerosis. Low-density blood pool suggests anemia. Chronic right upper lobe scarring. Mucus in both mainstem bronchi. Suspected left renal cysts. Indistinct but likely enlarged prostate gland.    04/06/2017 - 04/18/2017 Radiation Therapy    He received SBRT treatment Radiation treatment dates:   04/06/17 - 04/18/17  Site/dose:   Right SBRT Lung// 50 Gy in 5 fx  Beams/energy:   Photon // SRBT/ SBT-3D     06/26/2017 PET scan    1. Clear interval response to therapy of the 2 right lower lobe pulmonary nodules, each decreased in both size and hypermetabolism. 2. Stable size and hypermetabolic activity in the left upper lobe nodule. 3. Diffusely decreased FDG accumulation within the marrow space. 4. Symmetric uptake in both parotid glands is likely physiologic. Given the symmetry, this is most likely physiologic or inflammatory. 5. Prostatomegaly with marked bladder distention. Component of bladder outlet obstruction suspected. 6. Emphysema. (ICD10-J43.9) 7. Aortic Atherosclerois (ICD10-170.0)  09/25/2017 PET scan    1. New and enlarging small hypermetabolic nodules in the lingula are compatible with metastatic disease. These are in proximity to the linear scarring fiducial markers. 2. New hypermetabolic subcarinal nodal metastasis. 3. Left upper lobe index nodule is stable to slightly progressed in the interval in shows  mild increase in hypermetabolism. 4. No substantial change in the 2 index right lower lobe nodules showing only minimal FDG uptake, as before. 5. Interval progression of patchy airspace opacity in both dependent lower lungs, compatible with infection/inflammation.    10/12/2017 - 12/13/2017 Chemotherapy    The patient had atezolizumab for chemotherapy treatment.     01/02/2018 PET scan    2.8 cm left upper lobe nodule, increased, corresponding to known primary bronchogenic neoplasm.  Two lingular nodules measuring up to 11 mm, mildly increased, suspicious for metastases.  12 mm short axis subcarinal node, worrisome for nodal metastasis, grossly unchanged.  Patchy right lower lobe opacity, suspicious for pneumonia. Mild left basilar opacity, improved.     REVIEW OF SYSTEMS:   Constitutional: Denies fevers, chills  Eyes: Denies blurriness of vision Ears, nose, mouth, throat, and face: Denies mucositis or sore throat Cardiovascular: Denies palpitation, chest discomfort or lower extremity swelling Gastrointestinal:  Denies nausea, heartburn or change in bowel habits Skin: Denies abnormal skin rashes Lymphatics: Denies new lymphadenopathy or easy bruising Behavioral/Psych: Mood is stable, no new changes  All other systems were reviewed with the patient and are negative.  I have reviewed the past medical history, past surgical history, social history and family history with the patient and they are unchanged from previous note.  ALLERGIES:  is allergic to no known allergies.  MEDICATIONS:  Current Outpatient Medications  Medication Sig Dispense Refill  . albuterol (PROVENTIL HFA;VENTOLIN HFA) 108 (90 Base) MCG/ACT inhaler Inhale 2 puffs into the lungs every 4 (four) hours as needed for wheezing or shortness of breath. 1 Inhaler 5  . ferrous sulfate 325 (65 FE) MG tablet Take 325 mg by mouth daily with breakfast.    . folic acid (FOLVITE) 1 MG tablet Take 1 mg by mouth daily.    .  Glycopyrrolate-Formoterol (BEVESPI AEROSPHERE) 9-4.8 MCG/ACT AERO Inhale 2 puffs into the lungs 2 (two) times daily. 2 Inhaler 0  . levETIRAcetam (KEPPRA) 500 MG tablet Take 1 tablet (500 mg total) by mouth 2 (two) times daily. 180 tablet 3  . mirtazapine (REMERON) 30 MG tablet Take 1 tablet (30 mg total) by mouth at bedtime. 30 tablet 11   No current facility-administered medications for this visit.    Facility-Administered Medications Ordered in Other Visits  Medication Dose Route Frequency Provider Last Rate Last Dose  . Darbepoetin Alfa (ARANESP) injection 500 mcg  500 mcg Subcutaneous Once Alvy Bimler, Arlen Dupuis, MD      . heparin lock flush 100 unit/mL  500 Units Intracatheter Once Alvy Bimler, Mazell Aylesworth, MD      . Influenza vac split quadrivalent PF (FLUZONE HIGH-DOSE) injection 0.5 mL  0.5 mL Intramuscular Once Yiselle Babich, MD      . sodium chloride flush (NS) 0.9 % injection 10 mL  10 mL Intracatheter Once Alvy Bimler, Mayson Sterbenz, MD        PHYSICAL EXAMINATION: ECOG PERFORMANCE STATUS: 3 - Symptomatic, >50% confined to bed  Vitals:   01/18/18 1218  BP: (!) 96/59  Pulse: 90  Resp: 17  SpO2: 100%   Filed Weights   01/18/18 1218  Weight: 99 lb 9.6 oz (45.2 kg)    GENERAL:alert, no distress  and comfortable.  He looks thin and cachectic. SKIN: skin color, texture, turgor are normal, no rashes or significant lesions EYES: normal, Conjunctiva are pink and non-injected, sclera clear OROPHARYNX:no exudate, no erythema and lips, buccal mucosa, and tongue normal  NECK: supple, thyroid normal size, non-tender, without nodularity LYMPH:  no palpable lymphadenopathy in the cervical, axillary or inguinal LUNGS: clear to auscultation and percussion with normal breathing effort HEART: regular rate & rhythm and no murmurs and no lower extremity edema ABDOMEN:abdomen soft, non-tender and normal bowel sounds Musculoskeletal: Digital clubbing is noted NEURO: alert & oriented x 3 with fluent speech, no focal motor/sensory  deficits  LABORATORY DATA:  I have reviewed the data as listed    Component Value Date/Time   NA 142 01/03/2018 1036   NA 142 05/03/2017 1114   K 4.2 01/03/2018 1036   K 4.5 05/03/2017 1114   CL 109 01/03/2018 1036   CL 107 09/19/2012 1325   CO2 26 01/03/2018 1036   CO2 21 (L) 05/03/2017 1114   GLUCOSE 80 01/03/2018 1036   GLUCOSE 88 05/03/2017 1114   GLUCOSE 97 09/19/2012 1325   BUN 18 01/03/2018 1036   BUN 18.5 05/03/2017 1114   CREATININE 0.81 01/03/2018 1036   CREATININE 0.9 05/03/2017 1114   CALCIUM 8.6 (L) 01/03/2018 1036   CALCIUM 8.9 05/03/2017 1114   PROT 8.2 (H) 01/03/2018 1036   PROT 8.6 (H) 05/03/2017 1114   ALBUMIN 2.6 (L) 01/03/2018 1036   ALBUMIN 3.2 (L) 05/03/2017 1114   AST 12 (L) 01/03/2018 1036   AST 9 05/03/2017 1114   ALT <6 01/03/2018 1036   ALT <6 05/03/2017 1114   ALKPHOS 93 01/03/2018 1036   ALKPHOS 126 05/03/2017 1114   BILITOT 0.4 01/03/2018 1036   BILITOT 0.34 05/03/2017 1114   GFRNONAA >60 01/03/2018 1036   GFRAA >60 01/03/2018 1036    No results found for: SPEP, UPEP  Lab Results  Component Value Date   WBC 3.6 (L) 01/18/2018   NEUTROABS 2.8 01/18/2018   HGB 8.6 (L) 01/18/2018   HCT 26.3 (L) 01/18/2018   MCV 103.3 (H) 01/18/2018   PLT 258 01/18/2018      Chemistry      Component Value Date/Time   NA 142 01/03/2018 1036   NA 142 05/03/2017 1114   K 4.2 01/03/2018 1036   K 4.5 05/03/2017 1114   CL 109 01/03/2018 1036   CL 107 09/19/2012 1325   CO2 26 01/03/2018 1036   CO2 21 (L) 05/03/2017 1114   BUN 18 01/03/2018 1036   BUN 18.5 05/03/2017 1114   CREATININE 0.81 01/03/2018 1036   CREATININE 0.9 05/03/2017 1114      Component Value Date/Time   CALCIUM 8.6 (L) 01/03/2018 1036   CALCIUM 8.9 05/03/2017 1114   ALKPHOS 93 01/03/2018 1036   ALKPHOS 126 05/03/2017 1114   AST 12 (L) 01/03/2018 1036   AST 9 05/03/2017 1114   ALT <6 01/03/2018 1036   ALT <6 05/03/2017 1114   BILITOT 0.4 01/03/2018 1036   BILITOT 0.34  05/03/2017 1114       RADIOGRAPHIC STUDIES: I have personally reviewed the radiological images as listed and agreed with the findings in the report. Nm Pet Image Restag (ps) Skull Base To Thigh  Result Date: 01/02/2018 CLINICAL DATA:  Subsequent treatment strategy for non-small cell lung cancer. EXAM: NUCLEAR MEDICINE PET SKULL BASE TO THIGH TECHNIQUE: 5.02 mCi F-18 FDG was injected intravenously. Full-ring PET imaging was performed from the skull  base to thigh after the radiotracer. CT data was obtained and used for attenuation correction and anatomic localization. Fasting blood glucose: 80 mg/dl COMPARISON:  09/25/2017 FINDINGS: Mediastinal blood pool activity: SUV max 1.67 NECK: No hypermetabolic cervical lymphadenopathy. Hypermetabolism in the left supraclavicular region is favored to reflect physiologic muscular activity. Incidental CT findings: none CHEST: 2.8 x 2.3 cm left upper lobe nodule (series 8/image 26), previously 2.2 x 2.2 cm, max SUV 8.2, previously 5.7. Two additional lingular nodules measuring up to 11 mm (series 8/images 52 and 59), previously 8 mm, max SUV 5.5, previously 5.4. Multifocal patchy opacities in the medial right lower lobe (series 8/image 58), max SUV 7.0, favoring infection. Mild patchy left basilar opacity, without associated hypermetabolism, improved. 12 mm subcarinal node (series 4/image 66), max SUV 4.5, previously 11 mm with max SUV 5.6. Incidental CT findings: Right apical pleural-parenchymal scarring. Moderate centrilobular and paraseptal emphysematous changes. Atherosclerotic calcifications of the aortic arch. Three vessel coronary atherosclerosis. Right chest port terminating at the cavoatrial junction. ABDOMEN/PELVIS: No abnormal hypermetabolism in the liver, spleen, pancreas, or adrenal glands. No hypermetabolic lymphadenopathy in the abdomen/pelvis. Atherosclerotic calcifications the abdominal aorta and branch vessels. Renal vascular calcifications. 2.5 cm left  upper pole renal sinus cyst versus calyceal diverticulum. Additional left lower pole renal cyst. Distended bladder. Incidental CT findings: none SKELETON: No focal hypermetabolic activity to suggest skeletal metastasis. Incidental CT findings: Degenerative changes of the visualized thoracolumbar spine. IMPRESSION: 2.8 cm left upper lobe nodule, increased, corresponding to known primary bronchogenic neoplasm. Two lingular nodules measuring up to 11 mm, mildly increased, suspicious for metastases. 12 mm short axis subcarinal node, worrisome for nodal metastasis, grossly unchanged. Patchy right lower lobe opacity, suspicious for pneumonia. Mild left basilar opacity, improved. Electronically Signed   By: Julian Hy M.D.   On: 01/02/2018 16:42    All questions were answered. The patient knows to call the clinic with any problems, questions or concerns. No barriers to learning was detected.  I spent 30 minutes counseling the patient face to face. The total time spent in the appointment was 40 minutes and more than 50% was on counseling and review of test results  [name], MD 01/18/2018 12:48 PM

## 2018-01-18 NOTE — Assessment & Plan Note (Addendum)
I explained to the patient and family members again the rationale of not pursuing further palliative chemotherapy due to his frail status and refractory to multiple treatment I recommend we focus on supportive care only They agree with the plan of care We discussed the importance of preventive care and reviewed the vaccination programs. He does not have any prior allergic reactions to influenza vaccination. He agrees to proceed with influenza vaccination today and we will administer it today at the clinic.

## 2018-01-18 NOTE — Assessment & Plan Note (Addendum)
He is transfusion dependent He does not need transfusion today We will prescribe darbepoetin injection, to keep hemoglobin greater than 10 We discussed the risk and benefits of continuing blood transfusion support in the presence of progressive metastatic cancer If his hemoglobin is less than 8, he will receive a unit of blood transfusion. I plan to bring him back again in 2 weeks for further follow-up and transfusion as needed

## 2018-01-18 NOTE — Assessment & Plan Note (Signed)
He is profoundly cachectic I recommend increasing the dose of Remeron to 30 mg and he agreed to proceed.

## 2018-01-18 NOTE — Assessment & Plan Note (Signed)
We had extensive goals of care discussion in the past The patient has some baseline memory impairment His wife is the dedicated healthcare power of attorney We discussed poor prognosis, that he will likely succumb to the disease in less than 6 months. After extensive discussion, the patient and family members are agreed to DNR I gave him a DNR order I will refer him to care connection/palliative care service to establish care Due to ongoing need for blood transfusion and injections, he is currently not a hospice patient yet.

## 2018-01-31 ENCOUNTER — Other Ambulatory Visit: Payer: Self-pay

## 2018-01-31 ENCOUNTER — Encounter: Payer: Self-pay | Admitting: Hematology and Oncology

## 2018-01-31 ENCOUNTER — Inpatient Hospital Stay: Payer: Medicare Other

## 2018-01-31 ENCOUNTER — Inpatient Hospital Stay (HOSPITAL_BASED_OUTPATIENT_CLINIC_OR_DEPARTMENT_OTHER): Payer: Medicare Other | Admitting: Hematology and Oncology

## 2018-01-31 VITALS — BP 128/70 | HR 81 | Temp 97.5°F | Resp 18 | Ht 72.0 in | Wt 102.6 lb

## 2018-01-31 DIAGNOSIS — Z8589 Personal history of malignant neoplasm of other organs and systems: Secondary | ICD-10-CM

## 2018-01-31 DIAGNOSIS — C3412 Malignant neoplasm of upper lobe, left bronchus or lung: Secondary | ICD-10-CM

## 2018-01-31 DIAGNOSIS — R64 Cachexia: Secondary | ICD-10-CM

## 2018-01-31 DIAGNOSIS — D4621 Refractory anemia with excess of blasts 1: Secondary | ICD-10-CM

## 2018-01-31 DIAGNOSIS — Z79899 Other long term (current) drug therapy: Secondary | ICD-10-CM

## 2018-01-31 DIAGNOSIS — D4620 Refractory anemia with excess of blasts, unspecified: Secondary | ICD-10-CM

## 2018-01-31 DIAGNOSIS — F17210 Nicotine dependence, cigarettes, uncomplicated: Secondary | ICD-10-CM

## 2018-01-31 DIAGNOSIS — Z9221 Personal history of antineoplastic chemotherapy: Secondary | ICD-10-CM | POA: Diagnosis not present

## 2018-01-31 DIAGNOSIS — I2510 Atherosclerotic heart disease of native coronary artery without angina pectoris: Secondary | ICD-10-CM

## 2018-01-31 DIAGNOSIS — Z923 Personal history of irradiation: Secondary | ICD-10-CM

## 2018-01-31 DIAGNOSIS — Z66 Do not resuscitate: Secondary | ICD-10-CM

## 2018-01-31 DIAGNOSIS — I700 Atherosclerosis of aorta: Secondary | ICD-10-CM

## 2018-01-31 DIAGNOSIS — J449 Chronic obstructive pulmonary disease, unspecified: Secondary | ICD-10-CM

## 2018-01-31 LAB — CBC WITH DIFFERENTIAL/PLATELET
Basophils Absolute: 0 10*3/uL (ref 0.0–0.1)
Basophils Relative: 1 %
Eosinophils Absolute: 0.1 10*3/uL (ref 0.0–0.5)
Eosinophils Relative: 3 %
HEMATOCRIT: 23.4 % — AB (ref 38.4–49.9)
Hemoglobin: 7.4 g/dL — ABNORMAL LOW (ref 13.0–17.1)
LYMPHS PCT: 25 %
Lymphs Abs: 0.6 10*3/uL — ABNORMAL LOW (ref 0.9–3.3)
MCH: 34.1 pg — AB (ref 27.2–33.4)
MCHC: 31.6 g/dL — ABNORMAL LOW (ref 32.0–36.0)
MCV: 107.8 fL — ABNORMAL HIGH (ref 79.3–98.0)
Monocytes Absolute: 0.3 10*3/uL (ref 0.1–0.9)
Monocytes Relative: 11 %
NEUTROS ABS: 1.4 10*3/uL — AB (ref 1.5–6.5)
Neutrophils Relative %: 60 %
PLATELETS: 219 10*3/uL (ref 140–400)
RBC: 2.17 MIL/uL — AB (ref 4.20–5.82)
WBC: 2.3 10*3/uL — ABNORMAL LOW (ref 4.0–10.3)

## 2018-01-31 LAB — CMP (CANCER CENTER ONLY)
ALK PHOS: 94 U/L (ref 38–126)
ALT: 7 U/L (ref 0–44)
AST: 11 U/L — ABNORMAL LOW (ref 15–41)
Albumin: 2.6 g/dL — ABNORMAL LOW (ref 3.5–5.0)
Anion gap: 6 (ref 5–15)
BILIRUBIN TOTAL: 0.4 mg/dL (ref 0.3–1.2)
BUN: 20 mg/dL (ref 8–23)
CALCIUM: 8.5 mg/dL — AB (ref 8.9–10.3)
CHLORIDE: 113 mmol/L — AB (ref 98–111)
CO2: 25 mmol/L (ref 22–32)
CREATININE: 0.78 mg/dL (ref 0.61–1.24)
GFR, Est AFR Am: >60 mL/min (ref >60)
Glucose, Bld: 83 mg/dL (ref 70–99)
Potassium: 4.4 mmol/L (ref 3.5–5.1)
Sodium: 144 mmol/L (ref 135–145)
TOTAL PROTEIN: 8 g/dL (ref 6.5–8.1)

## 2018-01-31 LAB — SAMPLE TO BLOOD BANK

## 2018-01-31 LAB — TSH: TSH: 0.941 u[IU]/mL (ref 0.320–4.118)

## 2018-01-31 LAB — PREPARE RBC (CROSSMATCH)

## 2018-01-31 MED ORDER — DIPHENHYDRAMINE HCL 25 MG PO CAPS
25.0000 mg | ORAL_CAPSULE | Freq: Once | ORAL | Status: AC
  Administered 2018-01-31: 25 mg via ORAL

## 2018-01-31 MED ORDER — ACETAMINOPHEN 325 MG PO TABS
ORAL_TABLET | ORAL | Status: AC
  Filled 2018-01-31: qty 2

## 2018-01-31 MED ORDER — DIPHENHYDRAMINE HCL 25 MG PO CAPS
ORAL_CAPSULE | ORAL | Status: AC
  Filled 2018-01-31: qty 1

## 2018-01-31 MED ORDER — HEPARIN SOD (PORK) LOCK FLUSH 100 UNIT/ML IV SOLN
500.0000 [IU] | Freq: Every day | INTRAVENOUS | Status: AC | PRN
  Administered 2018-01-31: 500 [IU]
  Filled 2018-01-31: qty 5

## 2018-01-31 MED ORDER — ACETAMINOPHEN 325 MG PO TABS
650.0000 mg | ORAL_TABLET | Freq: Once | ORAL | Status: AC
  Administered 2018-01-31: 650 mg via ORAL

## 2018-01-31 MED ORDER — SODIUM CHLORIDE 0.9% FLUSH
10.0000 mL | INTRAVENOUS | Status: AC | PRN
  Administered 2018-01-31: 10 mL
  Filled 2018-01-31: qty 10

## 2018-01-31 MED ORDER — SODIUM CHLORIDE 0.9% IV SOLUTION
250.0000 mL | Freq: Once | INTRAVENOUS | Status: AC
  Administered 2018-01-31: 250 mL via INTRAVENOUS
  Filled 2018-01-31: qty 250

## 2018-01-31 NOTE — Assessment & Plan Note (Signed)
He is profoundly cachectic I recommend increasing the dose of Remeron to 30 mg and he agreed to proceed.

## 2018-01-31 NOTE — Progress Notes (Signed)
[facility] OFFICE PROGRESS NOTE  Patient Care Team: [name], MD as PCP - General (Family Medicine) [name], MD as Consulting Physician (Hematology and Oncology)  ASSESSMENT & PLAN:  Cancer of upper lobe of left lung ([place]) I explained to the patient and family members again the rationale of not pursuing further palliative chemotherapy due to his frail status and refractory to multiple treatment I recommend we focus on supportive care only They agree with the plan of care  MDS (myelodysplastic syndrome), low grade ([place]) He is transfusion dependent We will prescribe darbepoetin injection, to keep hemoglobin greater than 10 We discussed the risk and benefits of continuing blood transfusion support in the presence of progressive metastatic cancer He will receive a unit of blood. I plan to bring him back again in 2 weeks for further follow-up and transfusion as needed  Malignant cachexia ([place]) He is profoundly cachectic I recommend increasing the dose of Remeron to 30 mg and he agreed to proceed.   Orders Placed This Encounter  Procedures  . Type and screen  . Prepare RBC    Standing Status:   Standing    Number of Occurrences:   1    Order Specific Question:   # of Units    Answer:   1 unit    Order Specific Question:   Transfusion Indications    Answer:   Symptomatic Anemia    Order Specific Question:   If emergent release call blood bank    Answer:   [facility] [phone]    INTERVAL HISTORY: Please see below for problem oriented charting. He returns with his wife for further follow-up He is doing well He is gaining weight He continues to smoke He has nonproductive cough No recent fever or chills. The patient denies any recent signs or symptoms of bleeding such as spontaneous epistaxis, hematuria or hematochezia.   SUMMARY OF ONCOLOGIC HISTORY: Oncology History   MDS, R-IPSS score of 3, low risk (Hg 8.9, +16 chromosome on BM, 2% blast  count)   Squamous cell carcinoma of the epiglottis   Primary site: Larynx - Supraglottis (Left)   Staging method: AJCC 7th Edition   Clinical: Stage I (T1, N0, M0) signed by [name], MD on 04/30/2013 12:49 PM   Pathologic: Stage I (T1, N0, cM0) signed by [name], MD on 04/30/2013 12:49 PM   Summary: Stage I (T1, N0, cM0)       MDS (myelodysplastic syndrome), low grade ([place])   02/01/2007 Bone Marrow Biopsy    BM biopsy was abnormal, overall probable low grade MDS    03/23/2011 - 02/06/2013 Chemotherapy    [name], discontinued due to diagnosis of laryngeal ca    09/06/2013 Bone Marrow Biopsy    Repeat bone marrow aspirate and biopsy confirmed low-grade myelodysplastic syndrome.    09/17/2013 -  Chemotherapy    The patient resumed darbepoetin injections to treat the anemia.     History of head and neck cancer   03/06/2013 Procedure    Biopsy from epiglottic region showed invasive [facility]    04/25/2013 Imaging    Ct scan showed no other involvement in the LN    05/13/2013 - 06/28/2013 Radiation Therapy    He received radiation therapy, 70 gray in 35 fractions to the larynx    06/09/2016 Imaging    CT scan of the chest showed Bilateral spiculated soft tissue pulmonary masses, highly suspicious for multifocal malignancy. Borderline enlarged left-sided mediastinal lymphadenopathy. Background of  severe emphysematous changes, chronic cylindrical bronchiectasis and hyperinflation of the lungs. Findings consistent with longstanding COPD. Diffusely thickened interstitial markings. This may represent chronic interstitial lung changes, however lymphangitic spread of malignancy cannot be excluded. Anterior compression deformities of several of the thoracic vertebral bodies, with heterogeneous appearance of T7 and T9 vertebral body. This may represent degenerative changes, however osseous metastatic disease cannot be excluded.     07/11/2016 PET scan    There are 2 nodules in the  left upper lobe and 2 nodules within the right lower lobe which have spiculated margin and exhibit intense radiotracer uptake compatible with multifocal pulmonary metastasis versus multiple synchronous primary pulmonary neoplasms. 2. Emphysema 3. Aortic atherosclerosis and multi vessel coronary artery calcification.     Cancer of upper lobe of left lung ([place])   06/09/2016 Imaging    CT chest: Bilateral spiculated soft tissue pulmonary masses, highly suspicious for multifocal malignancy. Borderline enlarged left-sided mediastinal lymphadenopathy. Background of severe emphysematous changes, chronic cylindrical bronchiectasis and hyperinflation of the lungs. Findings consistent with longstanding COPD. Diffusely thickened interstitial markings. This may represent chronic interstitial lung changes, however lymphangitic spread of malignancy cannot be excluded. Anterior compression deformities of several of the thoracic vertebral bodies, with heterogeneous appearance of T7 and T9 vertebral body. This may represent degenerative changes, however osseous metastatic disease cannot be excluded.     06/29/2016 Imaging    MRI brain: No evidence of metastatic disease or recent infarction. Advanced generalized brain atrophy with moderate to marked chronic small-vessel ischemic changes throughout. FLAIR imaging appears to show scattered gyri showing FLAIR hyperintensity. These abnormalities are not confirmed with restricted diffusion or contrast enhancement. Therefore, we are not certain that this is not artifactual. The differential diagnosis does include true pathology such as posterior reversible encephalopathy, viral or prion disease, postictal change in post chemotherapy change. If there is concern about active CNS pathology, re- scanning in 4-6 weeks could be useful.    07/27/2016 Imaging    CT chest: Bilateral pulmonary lesions. These are suspicious for metastatic disease. Lesions have minimally changed but there  may be slight enlargement of the cavitary lesion in the right lower lobe. Severe emphysematous changes.     08/10/2016 Pathology Results    1. Lung, biopsy, RUL, (apical segment) - SCANT BENIGN LUNG PARENCHYMA. - THERE IS NO EVIDENCE OF MALIGNANCY. 2. Lung, biopsy, LUL lingula - SQUAMOUS CELL CARCINOMA. - SEE COMMENT. 3. Lung, biopsy, LUL - ADENOCARCINOMA. - SEE COMMENT. Microscopic Comment 2. The malignant cells are positive for cytokeratin 56 and p63. They are negative for TTF-1. The findings are consistent with squamous cell carcinoma. There is likely insufficient tissue remaining for additional studies, if requested. 3. The malignant cells are positive for TTF-1 and negative for p63 and cytokeratin 5/6. The findings are consistent with adenocarcinoma.    08/10/2016 Procedure    The targets were defined as follows: Left upper lobe nodule, target 1 Lingular nodule, target 2 Proximal right lower lobe nodule, target 3 More distal right lower lobe, nodule  The extendable working channel was secured into place and the locator guide was withdrawn. Under fluoroscopic guidance transbronchial needle brushings, transbronchial Wang needle biopsies, and transbronchial forceps biopsies were performed in the LUL (target 1) and the lingula (target 2) to be sent for cytology and pathology. A bronchioalveolar lavage was performed in the lingula in the vicinity of target 2  and sent for cytology and microbiology (bacterial, fungal, AFB smears and cultures).  Fiducial markers were then placed around target  1, target 2, and in the right lower lobe in the vicinity of both target 3 and target 4 to be used for possible radiation therapy should this be indicated. At the end of the procedure a general airway inspection was performed and there was no evidence of active bleeding. The bronchoscope was removed.  The patient tolerated the procedure well. There was no significant blood loss and there were no obvious  complications. A post-procedural chest x-ray is pending.  Samples: 1. Transbronchial needle brushings from targets 1 and 2 2. Transbronchial Wang needle biopsies from targets 1 and 2 3. Transbronchial forceps biopsies from targets 1 and 2 4. Bronchoalveolar lavage from lingula, target 2 5. Endobronchial biopsies from RUL apical segment 6. Endobronchial brushings from right upper lobe apical segment     08/10/2016 Genetic Testing    Patient has genetic testing done for [employer] and PD-L1 testing Results revealed PD-L1 testing is negative 0%    08/31/2016 Procedure    Technically successful right IJ power-injectable port catheter placement. Ready for routine use    09/21/2016 - 06/08/2017 Chemotherapy    The patient had chemotherapy with carboplatin and Taxol.    11/30/2016 PET scan    1. Interval resection of the hypermetabolic lingular nodule. 2. The 3 other spiculated, hypermetabolic nodules within the left upper and right lower lobes are slightly smaller with decreased metabolic activity, consistent with some response to interval therapy. 3. No progressive disease    03/06/2017 PET scan    1. Mixed appearance, with significant improvement in the left upper lobe nodule ; stable appearance of the more cephalad right lower lobe nodule; but enlargement and increased in metabolic activity of the more inferior right lower lobe nodule which appears cavitary. No new nodule. 2. Previously there was some moderate increase in diffuse marrow activity. That has increased further, and could be a response from prior granulocyte/marrow stimulation, although I cannot completely exclude the possibility that this is related to the patient's myelodysplastic syndrome. No CT correlate. 3. Other imaging findings of potential clinical significance: Aortic Atherosclerosis (ICD10-I70.0) and Emphysema (ICD10-J43.9). Coronary atherosclerosis. Low-density blood pool suggests anemia. Chronic right upper lobe  scarring. Mucus in both mainstem bronchi. Suspected left renal cysts. Indistinct but likely enlarged prostate gland.    04/06/2017 - 04/18/2017 Radiation Therapy    He received SBRT treatment Radiation treatment dates:   04/06/17 - 04/18/17  Site/dose:   Right SBRT Lung// 50 Gy in 5 fx  Beams/energy:   Photon // SRBT/ SBT-3D     06/26/2017 PET scan    1. Clear interval response to therapy of the 2 right lower lobe pulmonary nodules, each decreased in both size and hypermetabolism. 2. Stable size and hypermetabolic activity in the left upper lobe nodule. 3. Diffusely decreased FDG accumulation within the marrow space. 4. Symmetric uptake in both parotid glands is likely physiologic. Given the symmetry, this is most likely physiologic or inflammatory. 5. Prostatomegaly with marked bladder distention. Component of bladder outlet obstruction suspected. 6. Emphysema. (ICD10-J43.9) 7. Aortic Atherosclerois (ICD10-170.0)    09/25/2017 PET scan    1. New and enlarging small hypermetabolic nodules in the lingula are compatible with metastatic disease. These are in proximity to the linear scarring fiducial markers. 2. New hypermetabolic subcarinal nodal metastasis. 3. Left upper lobe index nodule is stable to slightly progressed in the interval in shows mild increase in hypermetabolism. 4. No substantial change in the 2 index right lower lobe nodules showing only minimal FDG uptake, as before.  5. Interval progression of patchy airspace opacity in both dependent lower lungs, compatible with infection/inflammation.    10/12/2017 - 12/13/2017 Chemotherapy    The patient had atezolizumab for chemotherapy treatment.     01/02/2018 PET scan    2.8 cm left upper lobe nodule, increased, corresponding to known primary bronchogenic neoplasm.  Two lingular nodules measuring up to 11 mm, mildly increased, suspicious for metastases.  12 mm short axis subcarinal node, worrisome for nodal metastasis, grossly  unchanged.  Patchy right lower lobe opacity, suspicious for pneumonia. Mild left basilar opacity, improved.     REVIEW OF SYSTEMS:   Constitutional: Denies fevers, chills or abnormal weight loss Eyes: Denies blurriness of vision Ears, nose, mouth, throat, and face: Denies mucositis or sore throat Respiratory: Denies cough, dyspnea or wheezes Cardiovascular: Denies palpitation, chest discomfort or lower extremity swelling Gastrointestinal:  Denies nausea, heartburn or change in bowel habits Skin: Denies abnormal skin rashes Lymphatics: Denies new lymphadenopathy or easy bruising Neurological:Denies numbness, tingling or new weaknesses Behavioral/Psych: Mood is stable, no new changes  All other systems were reviewed with the patient and are negative.  I have reviewed the past medical history, past surgical history, social history and family history with the patient and they are unchanged from previous note.  ALLERGIES:  is allergic to no known allergies.  MEDICATIONS:  Current Outpatient Medications  Medication Sig Dispense Refill  . albuterol (PROVENTIL HFA;VENTOLIN HFA) 108 (90 Base) MCG/ACT inhaler Inhale 2 puffs into the lungs every 4 (four) hours as needed for wheezing or shortness of breath. 1 Inhaler 5  . ferrous sulfate 325 (65 FE) MG tablet Take 325 mg by mouth daily with breakfast.    . folic acid (FOLVITE) 1 MG tablet Take 1 mg by mouth daily.    . Glycopyrrolate-Formoterol (BEVESPI AEROSPHERE) 9-4.8 MCG/ACT AERO Inhale 2 puffs into the lungs 2 (two) times daily. 2 Inhaler 0  . levETIRAcetam (KEPPRA) 500 MG tablet Take 1 tablet (500 mg total) by mouth 2 (two) times daily. 180 tablet 3  . mirtazapine (REMERON) 30 MG tablet Take 1 tablet (30 mg total) by mouth at bedtime. 30 tablet 11   No current facility-administered medications for this visit.    Facility-Administered Medications Ordered in Other Visits  Medication Dose Route Frequency Provider Last Rate Last Dose  .  heparin lock flush 100 unit/mL  500 Units Intracatheter Daily PRN Alvy Bimler, Geniece Akers, MD      . sodium chloride flush (NS) 0.9 % injection 10 mL  10 mL Intracatheter PRN Alvy Bimler, Sherri Levenhagen, MD        PHYSICAL EXAMINATION: ECOG PERFORMANCE STATUS: 2 - Symptomatic, <50% confined to bed  Vitals:   01/31/18 1158  BP: 128/70  Pulse: 81  Resp: 18  Temp: (!) 97.5 F (36.4 C)  SpO2: 100%   Filed Weights   01/31/18 1158  Weight: 102 lb 9.6 oz (46.5 kg)    GENERAL:alert, no distress and comfortable.  He looks thin and cachectic SKIN: skin color, texture, turgor are normal, no rashes or significant lesions EYES: normal, Conjunctiva are pink and non-injected, sclera clear OROPHARYNX:no exudate, no erythema and lips, buccal mucosa, and tongue normal  NECK: supple, thyroid normal size, non-tender, without nodularity LYMPH:  no palpable lymphadenopathy in the cervical, axillary or inguinal LUNGS: clear to auscultation and percussion with normal breathing effort HEART: regular rate & rhythm and no murmurs and no lower extremity edema ABDOMEN:abdomen soft, non-tender and normal bowel sounds Musculoskeletal:no cyanosis of digits with digital clubbing NEURO:  alert & oriented x 3 with fluent speech, no focal motor/sensory deficits  LABORATORY DATA:  I have reviewed the data as listed    Component Value Date/Time   NA 144 01/31/2018 1046   NA 142 05/03/2017 1114   K 4.4 01/31/2018 1046   K 4.5 05/03/2017 1114   CL 113 (H) 01/31/2018 1046   CL 107 09/19/2012 1325   CO2 25 01/31/2018 1046   CO2 21 (L) 05/03/2017 1114   GLUCOSE 83 01/31/2018 1046   GLUCOSE 88 05/03/2017 1114   GLUCOSE 97 09/19/2012 1325   BUN 20 01/31/2018 1046   BUN 18.5 05/03/2017 1114   CREATININE 0.78 01/31/2018 1046   CREATININE 0.9 05/03/2017 1114   CALCIUM 8.5 (L) 01/31/2018 1046   CALCIUM 8.9 05/03/2017 1114   PROT 8.0 01/31/2018 1046   PROT 8.6 (H) 05/03/2017 1114   ALBUMIN 2.6 (L) 01/31/2018 1046   ALBUMIN 3.2 (L)  05/03/2017 1114   AST 11 (L) 01/31/2018 1046   AST 9 05/03/2017 1114   ALT 7 01/31/2018 1046   ALT <6 05/03/2017 1114   ALKPHOS 94 01/31/2018 1046   ALKPHOS 126 05/03/2017 1114   BILITOT 0.4 01/31/2018 1046   BILITOT 0.34 05/03/2017 1114   GFRNONAA >60 01/31/2018 1046   GFRAA >60 01/31/2018 1046    No results found for: SPEP, UPEP  Lab Results  Component Value Date   WBC 2.3 (L) 01/31/2018   NEUTROABS 1.4 (L) 01/31/2018   HGB 7.4 (L) 01/31/2018   HCT 23.4 (L) 01/31/2018   MCV 107.8 (H) 01/31/2018   PLT 219 01/31/2018      Chemistry      Component Value Date/Time   NA 144 01/31/2018 1046   NA 142 05/03/2017 1114   K 4.4 01/31/2018 1046   K 4.5 05/03/2017 1114   CL 113 (H) 01/31/2018 1046   CL 107 09/19/2012 1325   CO2 25 01/31/2018 1046   CO2 21 (L) 05/03/2017 1114   BUN 20 01/31/2018 1046   BUN 18.5 05/03/2017 1114   CREATININE 0.78 01/31/2018 1046   CREATININE 0.9 05/03/2017 1114      Component Value Date/Time   CALCIUM 8.5 (L) 01/31/2018 1046   CALCIUM 8.9 05/03/2017 1114   ALKPHOS 94 01/31/2018 1046   ALKPHOS 126 05/03/2017 1114   AST 11 (L) 01/31/2018 1046   AST 9 05/03/2017 1114   ALT 7 01/31/2018 1046   ALT <6 05/03/2017 1114   BILITOT 0.4 01/31/2018 1046   BILITOT 0.34 05/03/2017 1114       RADIOGRAPHIC STUDIES: I have personally reviewed the radiological images as listed and agreed with the findings in the report. Nm Pet Image Restag (ps) Skull Base To Thigh  Result Date: 01/02/2018 CLINICAL DATA:  Subsequent treatment strategy for non-small cell lung cancer. EXAM: NUCLEAR MEDICINE PET SKULL BASE TO THIGH TECHNIQUE: 5.02 mCi F-18 FDG was injected intravenously. Full-ring PET imaging was performed from the skull base to thigh after the radiotracer. CT data was obtained and used for attenuation correction and anatomic localization. Fasting blood glucose: 80 mg/dl COMPARISON:  09/25/2017 FINDINGS: Mediastinal blood pool activity: SUV max 1.67 NECK: No  hypermetabolic cervical lymphadenopathy. Hypermetabolism in the left supraclavicular region is favored to reflect physiologic muscular activity. Incidental CT findings: none CHEST: 2.8 x 2.3 cm left upper lobe nodule (series 8/image 26), previously 2.2 x 2.2 cm, max SUV 8.2, previously 5.7. Two additional lingular nodules measuring up to 11 mm (series 8/images 52 and 59), previously 8  mm, max SUV 5.5, previously 5.4. Multifocal patchy opacities in the medial right lower lobe (series 8/image 58), max SUV 7.0, favoring infection. Mild patchy left basilar opacity, without associated hypermetabolism, improved. 12 mm subcarinal node (series 4/image 66), max SUV 4.5, previously 11 mm with max SUV 5.6. Incidental CT findings: Right apical pleural-parenchymal scarring. Moderate centrilobular and paraseptal emphysematous changes. Atherosclerotic calcifications of the aortic arch. Three vessel coronary atherosclerosis. Right chest port terminating at the cavoatrial junction. ABDOMEN/PELVIS: No abnormal hypermetabolism in the liver, spleen, pancreas, or adrenal glands. No hypermetabolic lymphadenopathy in the abdomen/pelvis. Atherosclerotic calcifications the abdominal aorta and branch vessels. Renal vascular calcifications. 2.5 cm left upper pole renal sinus cyst versus calyceal diverticulum. Additional left lower pole renal cyst. Distended bladder. Incidental CT findings: none SKELETON: No focal hypermetabolic activity to suggest skeletal metastasis. Incidental CT findings: Degenerative changes of the visualized thoracolumbar spine. IMPRESSION: 2.8 cm left upper lobe nodule, increased, corresponding to known primary bronchogenic neoplasm. Two lingular nodules measuring up to 11 mm, mildly increased, suspicious for metastases. 12 mm short axis subcarinal node, worrisome for nodal metastasis, grossly unchanged. Patchy right lower lobe opacity, suspicious for pneumonia. Mild left basilar opacity, improved. Electronically Signed    By: Julian Hy M.D.   On: 01/02/2018 16:42    All questions were answered. The patient knows to call the clinic with any problems, questions or concerns. No barriers to learning was detected.  I spent 15 minutes counseling the patient face to face. The total time spent in the appointment was 20 minutes and more than 50% was on counseling and review of test results  [name], MD 01/31/2018 2:57 PM

## 2018-01-31 NOTE — Assessment & Plan Note (Signed)
I explained to the patient and family members again the rationale of not pursuing further palliative chemotherapy due to his frail status and refractory to multiple treatment I recommend we focus on supportive care only They agree with the plan of care

## 2018-01-31 NOTE — Assessment & Plan Note (Signed)
He is transfusion dependent We will prescribe darbepoetin injection, to keep hemoglobin greater than 10 We discussed the risk and benefits of continuing blood transfusion support in the presence of progressive metastatic cancer He will receive a unit of blood. I plan to bring him back again in 2 weeks for further follow-up and transfusion as needed

## 2018-02-01 LAB — TYPE AND SCREEN
ABO/RH(D): O POS
ANTIBODY SCREEN: NEGATIVE
Donor AG Type: NEGATIVE
UNIT DIVISION: 0

## 2018-02-01 LAB — BPAM RBC
Blood Product Expiration Date: 201910112359
ISSUE DATE / TIME: 201909251319
Unit Type and Rh: 5100

## 2018-02-08 ENCOUNTER — Telehealth: Payer: Self-pay

## 2018-02-08 NOTE — Telephone Encounter (Signed)
Called wife regarding upcoming appt on 10/9. They will arrive early on 10/9 at 0930 for appt at 10 am. Scheduling message sent for appt with Dr. Alvy Bimler for 10 am on 10/9, with labs/ flush and infusion to follow.

## 2018-02-14 ENCOUNTER — Inpatient Hospital Stay: Payer: Medicare Other | Admitting: Nutrition

## 2018-02-14 ENCOUNTER — Encounter: Payer: Self-pay | Admitting: Hematology and Oncology

## 2018-02-14 ENCOUNTER — Inpatient Hospital Stay (HOSPITAL_BASED_OUTPATIENT_CLINIC_OR_DEPARTMENT_OTHER): Payer: Medicare Other | Admitting: Hematology and Oncology

## 2018-02-14 ENCOUNTER — Inpatient Hospital Stay: Payer: Medicare Other

## 2018-02-14 ENCOUNTER — Inpatient Hospital Stay: Payer: Medicare Other | Attending: Hematology and Oncology

## 2018-02-14 VITALS — BP 103/60 | HR 85 | Temp 97.5°F | Resp 18 | Ht 72.0 in | Wt 103.2 lb

## 2018-02-14 VITALS — BP 120/75 | HR 63 | Temp 97.7°F | Resp 16

## 2018-02-14 DIAGNOSIS — D4620 Refractory anemia with excess of blasts, unspecified: Secondary | ICD-10-CM

## 2018-02-14 DIAGNOSIS — C3412 Malignant neoplasm of upper lobe, left bronchus or lung: Secondary | ICD-10-CM | POA: Diagnosis not present

## 2018-02-14 DIAGNOSIS — Z79899 Other long term (current) drug therapy: Secondary | ICD-10-CM | POA: Diagnosis not present

## 2018-02-14 DIAGNOSIS — I700 Atherosclerosis of aorta: Secondary | ICD-10-CM | POA: Diagnosis not present

## 2018-02-14 DIAGNOSIS — Z923 Personal history of irradiation: Secondary | ICD-10-CM

## 2018-02-14 DIAGNOSIS — Z9221 Personal history of antineoplastic chemotherapy: Secondary | ICD-10-CM

## 2018-02-14 DIAGNOSIS — J449 Chronic obstructive pulmonary disease, unspecified: Secondary | ICD-10-CM | POA: Diagnosis not present

## 2018-02-14 DIAGNOSIS — R64 Cachexia: Secondary | ICD-10-CM | POA: Diagnosis not present

## 2018-02-14 DIAGNOSIS — Z7952 Long term (current) use of systemic steroids: Secondary | ICD-10-CM | POA: Diagnosis not present

## 2018-02-14 DIAGNOSIS — D61818 Other pancytopenia: Secondary | ICD-10-CM

## 2018-02-14 DIAGNOSIS — Z452 Encounter for adjustment and management of vascular access device: Secondary | ICD-10-CM | POA: Diagnosis not present

## 2018-02-14 LAB — CBC WITH DIFFERENTIAL/PLATELET
ABS IMMATURE GRANULOCYTES: 0.01 10*3/uL (ref 0.00–0.07)
Basophils Absolute: 0 10*3/uL (ref 0.0–0.1)
Basophils Relative: 1 %
EOS ABS: 0 10*3/uL (ref 0.0–0.5)
EOS PCT: 1 %
HCT: 25.1 % — ABNORMAL LOW (ref 39.0–52.0)
Hemoglobin: 7.9 g/dL — ABNORMAL LOW (ref 13.0–17.0)
Immature Granulocytes: 0 %
Lymphocytes Relative: 15 %
Lymphs Abs: 0.6 10*3/uL — ABNORMAL LOW (ref 0.7–4.0)
MCH: 34.5 pg — AB (ref 26.0–34.0)
MCHC: 31.5 g/dL (ref 30.0–36.0)
MCV: 109.6 fL — AB (ref 80.0–100.0)
MONO ABS: 0.3 10*3/uL (ref 0.1–1.0)
MONOS PCT: 8 %
Neutro Abs: 2.8 10*3/uL (ref 1.7–7.7)
Neutrophils Relative %: 75 %
PLATELETS: 262 10*3/uL (ref 150–400)
RBC: 2.29 MIL/uL — AB (ref 4.22–5.81)
WBC: 3.7 10*3/uL — AB (ref 4.0–10.5)
nRBC: 0 % (ref 0.0–0.2)

## 2018-02-14 LAB — PREPARE RBC (CROSSMATCH)

## 2018-02-14 LAB — SAMPLE TO BLOOD BANK

## 2018-02-14 MED ORDER — ACETAMINOPHEN 325 MG PO TABS
ORAL_TABLET | ORAL | Status: AC
  Filled 2018-02-14: qty 2

## 2018-02-14 MED ORDER — SODIUM CHLORIDE 0.9% FLUSH
10.0000 mL | INTRAVENOUS | Status: AC | PRN
  Administered 2018-02-14: 10 mL
  Filled 2018-02-14: qty 10

## 2018-02-14 MED ORDER — DARBEPOETIN ALFA 500 MCG/ML IJ SOSY
500.0000 ug | PREFILLED_SYRINGE | Freq: Once | INTRAMUSCULAR | Status: AC
  Administered 2018-02-14: 500 ug via SUBCUTANEOUS

## 2018-02-14 MED ORDER — SODIUM CHLORIDE 0.9% IV SOLUTION
250.0000 mL | Freq: Once | INTRAVENOUS | Status: AC
  Administered 2018-02-14: 250 mL via INTRAVENOUS
  Filled 2018-02-14: qty 250

## 2018-02-14 MED ORDER — DIPHENHYDRAMINE HCL 25 MG PO CAPS
ORAL_CAPSULE | ORAL | Status: AC
  Filled 2018-02-14: qty 1

## 2018-02-14 MED ORDER — DIPHENHYDRAMINE HCL 25 MG PO CAPS
25.0000 mg | ORAL_CAPSULE | Freq: Once | ORAL | Status: AC
  Administered 2018-02-14: 25 mg via ORAL

## 2018-02-14 MED ORDER — DARBEPOETIN ALFA 500 MCG/ML IJ SOSY
PREFILLED_SYRINGE | INTRAMUSCULAR | Status: AC
  Filled 2018-02-14: qty 1

## 2018-02-14 MED ORDER — HEPARIN SOD (PORK) LOCK FLUSH 100 UNIT/ML IV SOLN
500.0000 [IU] | Freq: Every day | INTRAVENOUS | Status: AC | PRN
  Administered 2018-02-14: 500 [IU]
  Filled 2018-02-14: qty 5

## 2018-02-14 MED ORDER — SODIUM CHLORIDE 0.9% FLUSH
10.0000 mL | Freq: Once | INTRAVENOUS | Status: AC
  Administered 2018-02-14: 10 mL
  Filled 2018-02-14: qty 10

## 2018-02-14 MED ORDER — ACETAMINOPHEN 325 MG PO TABS
650.0000 mg | ORAL_TABLET | Freq: Once | ORAL | Status: AC
  Administered 2018-02-14: 650 mg via ORAL

## 2018-02-14 MED ORDER — DARBEPOETIN ALFA 500 MCG/ML IJ SOSY: 500.0000 ug | PREFILLED_SYRINGE | Freq: Once | INTRAMUSCULAR | Status: DC

## 2018-02-14 MED ORDER — PREDNISONE 20 MG PO TABS: 20.0000 mg | ORAL_TABLET | Freq: Every day | ORAL | 11 refills | Status: AC

## 2018-02-14 NOTE — Progress Notes (Signed)
[facility] OFFICE PROGRESS NOTE  Patient Care Team: [name], MD as PCP - General (Family Medicine) [name], MD as Consulting Physician (Hematology and Oncology)  ASSESSMENT & PLAN:  Cancer of upper lobe of left lung [facility]) Previously, I explained to the patient and family members again the rationale of not pursuing further palliative chemotherapy due to his frail status and refractory to multiple treatment I recommend we focus on supportive care only They agree with the plan of care  MDS (myelodysplastic syndrome), low grade ([place]) He is transfusion dependent We will prescribe darbepoetin injection, to keep hemoglobin greater than 10 We discussed the risk and benefits of continuing blood transfusion support in the presence of progressive metastatic cancer He will receive a unit of blood whenever hemoglobin is less than 8 I plan to bring him back again in 2 weeks for further follow-up and transfusion as needed  Malignant cachexia ([place]) He is profoundly cachectic We will try increasing the dose of Remeron to 30 mg but he did not tolerate it due to excessive sedation I recommend reducing Remeron back to 15 mg daily I will add 20 mg of prednisone daily in the morning that might also help with his COPD   Orders Placed This Encounter  Procedures  . Vitamin B12    Standing Status:   Future    Standing Expiration Date:   03/21/2019  . Sedimentation rate    Standing Status:   Future    Standing Expiration Date:   03/21/2019  . Iron and TIBC    Standing Status:   Future    Standing Expiration Date:   03/21/2019  . Ferritin    Standing Status:   Future    Standing Expiration Date:   03/21/2019  . Erythropoietin    Standing Status:   Future    Standing Expiration Date:   03/21/2019  . Sample to Blood Bank    Standing Status:   Standing    Number of Occurrences:   33    Standing Expiration Date:   02/15/2019    INTERVAL HISTORY: Please see below for  problem oriented charting. He returns with his wife for further follow-up He continues to be cachectic despite increasing dose Remeron Remeron has causes urinary difficulties and some mild excessive sedation during daytime He continues to have mild productive cough of white sputum No hemoptysis No recent fever or chills  SUMMARY OF ONCOLOGIC HISTORY: Oncology History   MDS, R-IPSS score of 3, low risk (Hg 8.9, +16 chromosome on BM, 2% blast count)   Squamous cell carcinoma of the epiglottis   Primary site: Larynx - Supraglottis (Left)   Staging method: AJCC 7th Edition   Clinical: Stage I (T1, N0, M0) signed by [name], MD on 04/30/2013 12:49 PM   Pathologic: Stage I (T1, N0, cM0) signed by [name], MD on 04/30/2013 12:49 PM   Summary: Stage I (T1, N0, cM0)       MDS (myelodysplastic syndrome), low grade ([place])   02/01/2007 Bone Marrow Biopsy    BM biopsy was abnormal, overall probable low grade MDS    03/23/2011 - 02/06/2013 Chemotherapy    [name], discontinued due to diagnosis of laryngeal ca    09/06/2013 Bone Marrow Biopsy    Repeat bone marrow aspirate and biopsy confirmed low-grade myelodysplastic syndrome.    09/17/2013 -  Chemotherapy    The patient resumed darbepoetin injections to treat the anemia.     History of head  and neck cancer   03/06/2013 Procedure    Biopsy from epiglottic region showed invasive [facility]    04/25/2013 Imaging    Ct scan showed no other involvement in the LN    05/13/2013 - 06/28/2013 Radiation Therapy    He received radiation therapy, 70 gray in 35 fractions to the larynx    06/09/2016 Imaging    CT scan of the chest showed Bilateral spiculated soft tissue pulmonary masses, highly suspicious for multifocal malignancy. Borderline enlarged left-sided mediastinal lymphadenopathy. Background of severe emphysematous changes, chronic cylindrical bronchiectasis and hyperinflation of the lungs. Findings consistent with longstanding  COPD. Diffusely thickened interstitial markings. This may represent chronic interstitial lung changes, however lymphangitic spread of malignancy cannot be excluded. Anterior compression deformities of several of the thoracic vertebral bodies, with heterogeneous appearance of T7 and T9 vertebral body. This may represent degenerative changes, however osseous metastatic disease cannot be excluded.     07/11/2016 PET scan    There are 2 nodules in the left upper lobe and 2 nodules within the right lower lobe which have spiculated margin and exhibit intense radiotracer uptake compatible with multifocal pulmonary metastasis versus multiple synchronous primary pulmonary neoplasms. 2. Emphysema 3. Aortic atherosclerosis and multi vessel coronary artery calcification.     Cancer of upper lobe of left lung ([place])   06/09/2016 Imaging    CT chest: Bilateral spiculated soft tissue pulmonary masses, highly suspicious for multifocal malignancy. Borderline enlarged left-sided mediastinal lymphadenopathy. Background of severe emphysematous changes, chronic cylindrical bronchiectasis and hyperinflation of the lungs. Findings consistent with longstanding COPD. Diffusely thickened interstitial markings. This may represent chronic interstitial lung changes, however lymphangitic spread of malignancy cannot be excluded. Anterior compression deformities of several of the thoracic vertebral bodies, with heterogeneous appearance of T7 and T9 vertebral body. This may represent degenerative changes, however osseous metastatic disease cannot be excluded.     06/29/2016 Imaging    MRI brain: No evidence of metastatic disease or recent infarction. Advanced generalized brain atrophy with moderate to marked chronic small-vessel ischemic changes throughout. FLAIR imaging appears to show scattered gyri showing FLAIR hyperintensity. These abnormalities are not confirmed with restricted diffusion or contrast enhancement. Therefore, we are  not certain that this is not artifactual. The differential diagnosis does include true pathology such as posterior reversible encephalopathy, viral or prion disease, postictal change in post chemotherapy change. If there is concern about active CNS pathology, re- scanning in 4-6 weeks could be useful.    07/27/2016 Imaging    CT chest: Bilateral pulmonary lesions. These are suspicious for metastatic disease. Lesions have minimally changed but there may be slight enlargement of the cavitary lesion in the right lower lobe. Severe emphysematous changes.     08/10/2016 Pathology Results    1. Lung, biopsy, RUL, (apical segment) - SCANT BENIGN LUNG PARENCHYMA. - THERE IS NO EVIDENCE OF MALIGNANCY. 2. Lung, biopsy, LUL lingula - SQUAMOUS CELL CARCINOMA. - SEE COMMENT. 3. Lung, biopsy, LUL - ADENOCARCINOMA. - SEE COMMENT. Microscopic Comment 2. The malignant cells are positive for cytokeratin 56 and p63. They are negative for TTF-1. The findings are consistent with squamous cell carcinoma. There is likely insufficient tissue remaining for additional studies, if requested. 3. The malignant cells are positive for TTF-1 and negative for p63 and cytokeratin 5/6. The findings are consistent with adenocarcinoma.    08/10/2016 Procedure    The targets were defined as follows: Left upper lobe nodule, target 1 Lingular nodule, target 2 Proximal right lower lobe nodule, target 3  More distal right lower lobe, nodule  The extendable working channel was secured into place and the locator guide was withdrawn. Under fluoroscopic guidance transbronchial needle brushings, transbronchial Wang needle biopsies, and transbronchial forceps biopsies were performed in the LUL (target 1) and the lingula (target 2) to be sent for cytology and pathology. A bronchioalveolar lavage was performed in the lingula in the vicinity of target 2  and sent for cytology and microbiology (bacterial, fungal, AFB smears and cultures).   Fiducial markers were then placed around target 1, target 2, and in the right lower lobe in the vicinity of both target 3 and target 4 to be used for possible radiation therapy should this be indicated. At the end of the procedure a general airway inspection was performed and there was no evidence of active bleeding. The bronchoscope was removed.  The patient tolerated the procedure well. There was no significant blood loss and there were no obvious complications. A post-procedural chest x-ray is pending.  Samples: 1. Transbronchial needle brushings from targets 1 and 2 2. Transbronchial Wang needle biopsies from targets 1 and 2 3. Transbronchial forceps biopsies from targets 1 and 2 4. Bronchoalveolar lavage from lingula, target 2 5. Endobronchial biopsies from RUL apical segment 6. Endobronchial brushings from right upper lobe apical segment     08/10/2016 Genetic Testing    Patient has genetic testing done for [employer] and PD-L1 testing Results revealed PD-L1 testing is negative 0%    08/31/2016 Procedure    Technically successful right IJ power-injectable port catheter placement. Ready for routine use    09/21/2016 - 06/08/2017 Chemotherapy    The patient had chemotherapy with carboplatin and Taxol.    11/30/2016 PET scan    1. Interval resection of the hypermetabolic lingular nodule. 2. The 3 other spiculated, hypermetabolic nodules within the left upper and right lower lobes are slightly smaller with decreased metabolic activity, consistent with some response to interval therapy. 3. No progressive disease    03/06/2017 PET scan    1. Mixed appearance, with significant improvement in the left upper lobe nodule ; stable appearance of the more cephalad right lower lobe nodule; but enlargement and increased in metabolic activity of the more inferior right lower lobe nodule which appears cavitary. No new nodule. 2. Previously there was some moderate increase in diffuse marrow activity.  That has increased further, and could be a response from prior granulocyte/marrow stimulation, although I cannot completely exclude the possibility that this is related to the patient's myelodysplastic syndrome. No CT correlate. 3. Other imaging findings of potential clinical significance: Aortic Atherosclerosis (ICD10-I70.0) and Emphysema (ICD10-J43.9). Coronary atherosclerosis. Low-density blood pool suggests anemia. Chronic right upper lobe scarring. Mucus in both mainstem bronchi. Suspected left renal cysts. Indistinct but likely enlarged prostate gland.    04/06/2017 - 04/18/2017 Radiation Therapy    He received SBRT treatment Radiation treatment dates:   04/06/17 - 04/18/17  Site/dose:   Right SBRT Lung// 50 Gy in 5 fx  Beams/energy:   Photon // SRBT/ SBT-3D     06/26/2017 PET scan    1. Clear interval response to therapy of the 2 right lower lobe pulmonary nodules, each decreased in both size and hypermetabolism. 2. Stable size and hypermetabolic activity in the left upper lobe nodule. 3. Diffusely decreased FDG accumulation within the marrow space. 4. Symmetric uptake in both parotid glands is likely physiologic. Given the symmetry, this is most likely physiologic or inflammatory. 5. Prostatomegaly with marked bladder distention. Component of bladder  outlet obstruction suspected. 6. Emphysema. (ICD10-J43.9) 7. Aortic Atherosclerois (ICD10-170.0)    09/25/2017 PET scan    1. New and enlarging small hypermetabolic nodules in the lingula are compatible with metastatic disease. These are in proximity to the linear scarring fiducial markers. 2. New hypermetabolic subcarinal nodal metastasis. 3. Left upper lobe index nodule is stable to slightly progressed in the interval in shows mild increase in hypermetabolism. 4. No substantial change in the 2 index right lower lobe nodules showing only minimal FDG uptake, as before. 5. Interval progression of patchy airspace opacity in both  dependent lower lungs, compatible with infection/inflammation.    10/12/2017 - 12/13/2017 Chemotherapy    The patient had atezolizumab for chemotherapy treatment.     01/02/2018 PET scan    2.8 cm left upper lobe nodule, increased, corresponding to known primary bronchogenic neoplasm.  Two lingular nodules measuring up to 11 mm, mildly increased, suspicious for metastases.  12 mm short axis subcarinal node, worrisome for nodal metastasis, grossly unchanged.  Patchy right lower lobe opacity, suspicious for pneumonia. Mild left basilar opacity, improved.     REVIEW OF SYSTEMS:   Constitutional: Denies fevers, chills or abnormal weight loss Eyes: Denies blurriness of vision Ears, nose, mouth, throat, and face: Denies mucositis or sore throat Respiratory: Denies cough, dyspnea or wheezes Cardiovascular: Denies palpitation, chest discomfort or lower extremity swelling Gastrointestinal:  Denies nausea, heartburn or change in bowel habits Skin: Denies abnormal skin rashes Lymphatics: Denies new lymphadenopathy or easy bruising Neurological:Denies numbness, tingling or new weaknesses Behavioral/Psych: Mood is stable, no new changes  All other systems were reviewed with the patient and are negative.  I have reviewed the past medical history, past surgical history, social history and family history with the patient and they are unchanged from previous note.  ALLERGIES:  is allergic to no known allergies.  MEDICATIONS:  Current Outpatient Medications  Medication Sig Dispense Refill  . albuterol (PROVENTIL HFA;VENTOLIN HFA) 108 (90 Base) MCG/ACT inhaler Inhale 2 puffs into the lungs every 4 (four) hours as needed for wheezing or shortness of breath. 1 Inhaler 5  . ferrous sulfate 325 (65 FE) MG tablet Take 325 mg by mouth daily with breakfast.    . folic acid (FOLVITE) 1 MG tablet Take 1 mg by mouth daily.    . Glycopyrrolate-Formoterol (BEVESPI AEROSPHERE) 9-4.8 MCG/ACT AERO Inhale 2 puffs  into the lungs 2 (two) times daily. 2 Inhaler 0  . levETIRAcetam (KEPPRA) 500 MG tablet Take 1 tablet (500 mg total) by mouth 2 (two) times daily. 180 tablet 3  . mirtazapine (REMERON) 30 MG tablet Take 1 tablet (30 mg total) by mouth at bedtime. 30 tablet 11  . predniSONE (DELTASONE) 20 MG tablet Take 1 tablet (20 mg total) by mouth daily with breakfast. 30 tablet 11   No current facility-administered medications for this visit.     PHYSICAL EXAMINATION: ECOG PERFORMANCE STATUS: 2 - Symptomatic, <50% confined to bed  Vitals:   02/14/18 0942  BP: 103/60  Pulse: 85  Resp: 18  Temp: (!) 97.5 F (36.4 C)  SpO2: 100%   Filed Weights   02/14/18 0942  Weight: 103 lb 3.2 oz (46.8 kg)    GENERAL:alert, no distress and comfortable.  He looks frail, thin and cachectic SKIN: skin color, texture, turgor are normal, no rashes or significant lesions EYES: normal, Conjunctiva are pink and non-injected, sclera clear OROPHARYNX:no exudate, no erythema and lips, buccal mucosa, and tongue normal  NECK: supple, thyroid normal size, non-tender, without  nodularity LYMPH:  no palpable lymphadenopathy in the cervical, axillary or inguinal LUNGS: clear to auscultation and percussion with normal breathing effort HEART: regular rate & rhythm and no murmurs and no lower extremity edema ABDOMEN:abdomen soft, non-tender and normal bowel sounds Musculoskeletal:no cyanosis of digits with digital clubbing NEURO: alert & oriented x 3 with fluent speech, no focal motor/sensory deficits  LABORATORY DATA:  I have reviewed the data as listed    Component Value Date/Time   NA 144 01/31/2018 1046   NA 142 05/03/2017 1114   K 4.4 01/31/2018 1046   K 4.5 05/03/2017 1114   CL 113 (H) 01/31/2018 1046   CL 107 09/19/2012 1325   CO2 25 01/31/2018 1046   CO2 21 (L) 05/03/2017 1114   GLUCOSE 83 01/31/2018 1046   GLUCOSE 88 05/03/2017 1114   GLUCOSE 97 09/19/2012 1325   BUN 20 01/31/2018 1046   BUN 18.5  05/03/2017 1114   CREATININE 0.78 01/31/2018 1046   CREATININE 0.9 05/03/2017 1114   CALCIUM 8.5 (L) 01/31/2018 1046   CALCIUM 8.9 05/03/2017 1114   PROT 8.0 01/31/2018 1046   PROT 8.6 (H) 05/03/2017 1114   ALBUMIN 2.6 (L) 01/31/2018 1046   ALBUMIN 3.2 (L) 05/03/2017 1114   AST 11 (L) 01/31/2018 1046   AST 9 05/03/2017 1114   ALT 7 01/31/2018 1046   ALT <6 05/03/2017 1114   ALKPHOS 94 01/31/2018 1046   ALKPHOS 126 05/03/2017 1114   BILITOT 0.4 01/31/2018 1046   BILITOT 0.34 05/03/2017 1114   GFRNONAA >60 01/31/2018 1046   GFRAA >60 01/31/2018 1046    No results found for: SPEP, UPEP  Lab Results  Component Value Date   WBC 2.3 (L) 01/31/2018   NEUTROABS 1.4 (L) 01/31/2018   HGB 7.4 (L) 01/31/2018   HCT 23.4 (L) 01/31/2018   MCV 107.8 (H) 01/31/2018   PLT 219 01/31/2018      Chemistry      Component Value Date/Time   NA 144 01/31/2018 1046   NA 142 05/03/2017 1114   K 4.4 01/31/2018 1046   K 4.5 05/03/2017 1114   CL 113 (H) 01/31/2018 1046   CL 107 09/19/2012 1325   CO2 25 01/31/2018 1046   CO2 21 (L) 05/03/2017 1114   BUN 20 01/31/2018 1046   BUN 18.5 05/03/2017 1114   CREATININE 0.78 01/31/2018 1046   CREATININE 0.9 05/03/2017 1114      Component Value Date/Time   CALCIUM 8.5 (L) 01/31/2018 1046   CALCIUM 8.9 05/03/2017 1114   ALKPHOS 94 01/31/2018 1046   ALKPHOS 126 05/03/2017 1114   AST 11 (L) 01/31/2018 1046   AST 9 05/03/2017 1114   ALT 7 01/31/2018 1046   ALT <6 05/03/2017 1114   BILITOT 0.4 01/31/2018 1046   BILITOT 0.34 05/03/2017 1114       All questions were answered. The patient knows to call the clinic with any problems, questions or concerns. No barriers to learning was detected.  I spent 15 minutes counseling the patient face to face. The total time spent in the appointment was 20 minutes and more than 50% was on counseling and review of test results  [name], MD 02/14/2018 9:50 AM

## 2018-02-14 NOTE — Assessment & Plan Note (Signed)
Previously, I explained to the patient and family members again the rationale of not pursuing further palliative chemotherapy due to his frail status and refractory to multiple treatment I recommend we focus on supportive care only They agree with the plan of care

## 2018-02-14 NOTE — Patient Instructions (Signed)
Implanted Port Home Guide An implanted port is a type of central line that is placed under the skin. Central lines are used to provide IV access when treatment or nutrition needs to be given through a person's veins. Implanted ports are used for long-term IV access. An implanted port may be placed because:  You need IV medicine that would be irritating to the small veins in your hands or arms.  You need long-term IV medicines, such as antibiotics.  You need IV nutrition for a long period.  You need frequent blood draws for lab tests.  You need dialysis.  Implanted ports are usually placed in the chest area, but they can also be placed in the upper arm, the abdomen, or the leg. An implanted port has two main parts:  Reservoir. The reservoir is round and will appear as a small, raised area under your skin. The reservoir is the part where a needle is inserted to give medicines or draw blood.  Catheter. The catheter is a thin, flexible tube that extends from the reservoir. The catheter is placed into a large vein. Medicine that is inserted into the reservoir goes into the catheter and then into the vein.  How will I care for my incision site? Do not get the incision site wet. Bathe or shower as directed by your health care provider. How is my port accessed? Special steps must be taken to access the port:  Before the port is accessed, a numbing cream can be placed on the skin. This helps numb the skin over the port site.  Your health care provider uses a sterile technique to access the port. ? Your health care provider must put on a mask and sterile gloves. ? The skin over your port is cleaned carefully with an antiseptic and allowed to dry. ? The port is gently pinched between sterile gloves, and a needle is inserted into the port.  Only "non-coring" port needles should be used to access the port. Once the port is accessed, a blood return should be checked. This helps ensure that the port  is in the vein and is not clogged.  If your port needs to remain accessed for a constant infusion, a clear (transparent) bandage will be placed over the needle site. The bandage and needle will need to be changed every week, or as directed by your health care provider.  Keep the bandage covering the needle clean and dry. Do not get it wet. Follow your health care provider's instructions on how to take a shower or bath while the port is accessed.  If your port does not need to stay accessed, no bandage is needed over the port.  What is flushing? Flushing helps keep the port from getting clogged. Follow your health care provider's instructions on how and when to flush the port. Ports are usually flushed with saline solution or a medicine called heparin. The need for flushing will depend on how the port is used.  If the port is used for intermittent medicines or blood draws, the port will need to be flushed: ? After medicines have been given. ? After blood has been drawn. ? As part of routine maintenance.  If a constant infusion is running, the port may not need to be flushed.  How long will my port stay implanted? The port can stay in for as long as your health care provider thinks it is needed. When it is time for the port to come out, surgery will be   done to remove it. The procedure is similar to the one performed when the port was put in. When should I seek immediate medical care? When you have an implanted port, you should seek immediate medical care if:  You notice a bad smell coming from the incision site.  You have swelling, redness, or drainage at the incision site.  You have more swelling or pain at the port site or the surrounding area.  You have a fever that is not controlled with medicine.  This information is not intended to replace advice given to you by your health care provider. Make sure you discuss any questions you have with your health care provider. Document  Released: 04/25/2005 Document Revised: 10/01/2015 Document Reviewed: 12/31/2012 Elsevier Interactive Patient Education  2017 [employer]  

## 2018-02-14 NOTE — Assessment & Plan Note (Signed)
He is profoundly cachectic We will try increasing the dose of Remeron to 30 mg but he did not tolerate it due to excessive sedation I recommend reducing Remeron back to 15 mg daily I will add 20 mg of prednisone daily in the morning that might also help with his COPD

## 2018-02-14 NOTE — Patient Instructions (Signed)

## 2018-02-14 NOTE — Progress Notes (Signed)
Nutrition follow-up completed with patient during infusion for metastatic lung cancer. Weight decreased and was documented as 103 pounds October 9.  This is decreased from 109.4 pounds August 29. Patient reports he is consuming 2 bottles of Ensure or boost daily.  Patient prefers [employer]. Patient remains underweight and cachectic  Nutrition diagnosis: Underweight continues.  Intervention: I provided encouragement for patient to increase Ensure Plus or boost plus to 3-4 bottles daily. I provided samples and coupons. Teach back method used.  Monitoring, evaluation, goals: Patient will work to increase calories and protein to minimize further weight loss.  Next visit: Wednesday, November 6 during infusion.  **Disclaimer: This note was dictated with voice recognition software. Similar sounding words can inadvertently be transcribed and this note may contain transcription errors which may not have been corrected upon publication of note.**

## 2018-02-14 NOTE — Assessment & Plan Note (Signed)
He is transfusion dependent We will prescribe darbepoetin injection, to keep hemoglobin greater than 10 We discussed the risk and benefits of continuing blood transfusion support in the presence of progressive metastatic cancer He will receive a unit of blood whenever hemoglobin is less than 8 I plan to bring him back again in 2 weeks for further follow-up and transfusion as needed

## 2018-02-15 LAB — TYPE AND SCREEN
ABO/RH(D): O POS
ANTIBODY SCREEN: NEGATIVE
DONOR AG TYPE: NEGATIVE
UNIT DIVISION: 0

## 2018-02-15 LAB — BPAM RBC
Blood Product Expiration Date: 201911062359
ISSUE DATE / TIME: 201910091306
Unit Type and Rh: 5100

## 2018-02-28 ENCOUNTER — Inpatient Hospital Stay: Payer: Medicare Other

## 2018-02-28 ENCOUNTER — Telehealth: Payer: Self-pay | Admitting: Hematology and Oncology

## 2018-02-28 ENCOUNTER — Inpatient Hospital Stay (HOSPITAL_BASED_OUTPATIENT_CLINIC_OR_DEPARTMENT_OTHER): Payer: Medicare Other | Admitting: Hematology and Oncology

## 2018-02-28 ENCOUNTER — Encounter: Payer: Self-pay | Admitting: Hematology and Oncology

## 2018-02-28 DIAGNOSIS — C3412 Malignant neoplasm of upper lobe, left bronchus or lung: Secondary | ICD-10-CM

## 2018-02-28 DIAGNOSIS — D61818 Other pancytopenia: Secondary | ICD-10-CM

## 2018-02-28 DIAGNOSIS — I700 Atherosclerosis of aorta: Secondary | ICD-10-CM

## 2018-02-28 DIAGNOSIS — D4620 Refractory anemia with excess of blasts, unspecified: Secondary | ICD-10-CM

## 2018-02-28 DIAGNOSIS — Z9221 Personal history of antineoplastic chemotherapy: Secondary | ICD-10-CM | POA: Diagnosis not present

## 2018-02-28 DIAGNOSIS — R64 Cachexia: Secondary | ICD-10-CM

## 2018-02-28 DIAGNOSIS — J449 Chronic obstructive pulmonary disease, unspecified: Secondary | ICD-10-CM

## 2018-02-28 DIAGNOSIS — Z923 Personal history of irradiation: Secondary | ICD-10-CM

## 2018-02-28 DIAGNOSIS — Z79899 Other long term (current) drug therapy: Secondary | ICD-10-CM

## 2018-02-28 DIAGNOSIS — Z7952 Long term (current) use of systemic steroids: Secondary | ICD-10-CM

## 2018-02-28 LAB — CBC WITH DIFFERENTIAL/PLATELET
Abs Immature Granulocytes: 0.04 10*3/uL (ref 0.00–0.07)
Basophils Absolute: 0 10*3/uL (ref 0.0–0.1)
Basophils Relative: 0 %
EOS ABS: 0 10*3/uL (ref 0.0–0.5)
Eosinophils Relative: 0 %
HCT: 28.6 % — ABNORMAL LOW (ref 39.0–52.0)
Hemoglobin: 9 g/dL — ABNORMAL LOW (ref 13.0–17.0)
IMMATURE GRANULOCYTES: 1 %
LYMPHS ABS: 0.3 10*3/uL — AB (ref 0.7–4.0)
LYMPHS PCT: 3 %
MCH: 34.4 pg — AB (ref 26.0–34.0)
MCHC: 31.5 g/dL (ref 30.0–36.0)
MCV: 109.2 fL — ABNORMAL HIGH (ref 80.0–100.0)
Monocytes Absolute: 0.4 10*3/uL (ref 0.1–1.0)
Monocytes Relative: 4 %
NEUTROS PCT: 92 %
NRBC: 0 % (ref 0.0–0.2)
Neutro Abs: 7.6 10*3/uL (ref 1.7–7.7)
PLATELETS: 224 10*3/uL (ref 150–400)
RBC: 2.62 MIL/uL — AB (ref 4.22–5.81)
RDW: 26.1 % — AB (ref 11.5–15.5)
WBC: 8.3 10*3/uL (ref 4.0–10.5)

## 2018-02-28 LAB — SAMPLE TO BLOOD BANK

## 2018-02-28 MED ORDER — DARBEPOETIN ALFA 500 MCG/ML IJ SOSY
500.0000 ug | PREFILLED_SYRINGE | Freq: Once | INTRAMUSCULAR | Status: AC
  Administered 2018-02-28: 500 ug via SUBCUTANEOUS

## 2018-02-28 MED ORDER — HEPARIN SOD (PORK) LOCK FLUSH 100 UNIT/ML IV SOLN
500.0000 [IU] | Freq: Once | INTRAVENOUS | Status: AC
  Administered 2018-02-28: 500 [IU]
  Filled 2018-02-28: qty 5

## 2018-02-28 MED ORDER — DARBEPOETIN ALFA 500 MCG/ML IJ SOSY
PREFILLED_SYRINGE | INTRAMUSCULAR | Status: AC
  Filled 2018-02-28: qty 1

## 2018-02-28 MED ORDER — SODIUM CHLORIDE 0.9% FLUSH
10.0000 mL | Freq: Once | INTRAVENOUS | Status: AC
  Administered 2018-02-28: 10 mL
  Filled 2018-02-28: qty 10

## 2018-02-28 NOTE — Telephone Encounter (Signed)
Gave patient avs and calendar.  Per 10/23 los, changed dr for 11/6 and 11/20 appts (to Dr. Maylon Peppers).

## 2018-02-28 NOTE — Assessment & Plan Note (Signed)
He has gained some weight with improved energy and appetite while on prednisone He will continue mild prednisone taper as above

## 2018-02-28 NOTE — Assessment & Plan Note (Signed)
Previously, I explained to the patient and family members again the rationale of not pursuing further palliative chemotherapy due to his frail status and refractory to multiple treatment I recommend we focus on supportive care only They agree with the plan of care

## 2018-02-28 NOTE — Assessment & Plan Note (Signed)
He appears to be responding well to low-dose prednisone therapy along with transfusion support and darbepoetin injection He will get darbepoetin injection today without blood I recommend slow prednisone taper of 10 mg alternate with 20 mg every other day

## 2018-02-28 NOTE — Progress Notes (Signed)
[place] OFFICE PROGRESS NOTE  Patient Care Team: [name], MD as PCP - General (Family Medicine) [name], MD as Consulting Physician (Hematology and Oncology)  ASSESSMENT & PLAN:  Cancer of upper lobe of left lung [facility]) Previously, I explained to the patient and family members again the rationale of not pursuing further palliative chemotherapy due to his frail status and refractory to multiple treatment I recommend we focus on supportive care only They agree with the plan of care  MDS (myelodysplastic syndrome), low grade ([place]) He appears to be responding well to low-dose prednisone therapy along with transfusion support and darbepoetin injection He will get darbepoetin injection today without blood I recommend slow prednisone taper of 10 mg alternate with 20 mg every other day  Malignant cachexia ([place]) He has gained some weight with improved energy and appetite while on prednisone He will continue mild prednisone taper as above   No orders of the defined types were placed in this encounter.   INTERVAL HISTORY: Please see below for problem oriented charting. He returns for further follow-up with his wife He felt better since his been on prednisone He has improved energy and appetite No recent fever or chills His chronic cough is stable The patient denies any recent signs or symptoms of bleeding such as spontaneous epistaxis, hematuria or hematochezia.  SUMMARY OF ONCOLOGIC HISTORY: Oncology History   MDS, R-IPSS score of 3, low risk (Hg 8.9, +16 chromosome on BM, 2% blast count)   Squamous cell carcinoma of the epiglottis   Primary site: Larynx - Supraglottis (Left)   Staging method: AJCC 7th Edition   Clinical: Stage I (T1, N0, M0) signed by [name], MD on 04/30/2013 12:49 PM   Pathologic: Stage I (T1, N0, cM0) signed by [name], MD on 04/30/2013 12:49 PM   Summary: Stage I (T1, N0, cM0)       MDS (myelodysplastic syndrome), low  grade ([place])   02/01/2007 Bone Marrow Biopsy    BM biopsy was abnormal, overall probable low grade MDS    03/23/2011 - 02/06/2013 Chemotherapy    [name], discontinued due to diagnosis of laryngeal ca    09/06/2013 Bone Marrow Biopsy    Repeat bone marrow aspirate and biopsy confirmed low-grade myelodysplastic syndrome.    09/17/2013 -  Chemotherapy    The patient resumed darbepoetin injections to treat the anemia.     History of head and neck cancer   03/06/2013 Procedure    Biopsy from epiglottic region showed invasive [facility]    04/25/2013 Imaging    Ct scan showed no other involvement in the LN    05/13/2013 - 06/28/2013 Radiation Therapy    He received radiation therapy, 70 gray in 35 fractions to the larynx    06/09/2016 Imaging    CT scan of the chest showed Bilateral spiculated soft tissue pulmonary masses, highly suspicious for multifocal malignancy. Borderline enlarged left-sided mediastinal lymphadenopathy. Background of severe emphysematous changes, chronic cylindrical bronchiectasis and hyperinflation of the lungs. Findings consistent with longstanding COPD. Diffusely thickened interstitial markings. This may represent chronic interstitial lung changes, however lymphangitic spread of malignancy cannot be excluded. Anterior compression deformities of several of the thoracic vertebral bodies, with heterogeneous appearance of T7 and T9 vertebral body. This may represent degenerative changes, however osseous metastatic disease cannot be excluded.     07/11/2016 PET scan    There are 2 nodules in the left upper lobe and 2 nodules within the right lower lobe which  have spiculated margin and exhibit intense radiotracer uptake compatible with multifocal pulmonary metastasis versus multiple synchronous primary pulmonary neoplasms. 2. Emphysema 3. Aortic atherosclerosis and multi vessel coronary artery calcification.     Cancer of upper lobe of left lung ([place])   06/09/2016 Imaging     CT chest: Bilateral spiculated soft tissue pulmonary masses, highly suspicious for multifocal malignancy. Borderline enlarged left-sided mediastinal lymphadenopathy. Background of severe emphysematous changes, chronic cylindrical bronchiectasis and hyperinflation of the lungs. Findings consistent with longstanding COPD. Diffusely thickened interstitial markings. This may represent chronic interstitial lung changes, however lymphangitic spread of malignancy cannot be excluded. Anterior compression deformities of several of the thoracic vertebral bodies, with heterogeneous appearance of T7 and T9 vertebral body. This may represent degenerative changes, however osseous metastatic disease cannot be excluded.     06/29/2016 Imaging    MRI brain: No evidence of metastatic disease or recent infarction. Advanced generalized brain atrophy with moderate to marked chronic small-vessel ischemic changes throughout. FLAIR imaging appears to show scattered gyri showing FLAIR hyperintensity. These abnormalities are not confirmed with restricted diffusion or contrast enhancement. Therefore, we are not certain that this is not artifactual. The differential diagnosis does include true pathology such as posterior reversible encephalopathy, viral or prion disease, postictal change in post chemotherapy change. If there is concern about active CNS pathology, re- scanning in 4-6 weeks could be useful.    07/27/2016 Imaging    CT chest: Bilateral pulmonary lesions. These are suspicious for metastatic disease. Lesions have minimally changed but there may be slight enlargement of the cavitary lesion in the right lower lobe. Severe emphysematous changes.     08/10/2016 Pathology Results    1. Lung, biopsy, RUL, (apical segment) - SCANT BENIGN LUNG PARENCHYMA. - THERE IS NO EVIDENCE OF MALIGNANCY. 2. Lung, biopsy, LUL lingula - SQUAMOUS CELL CARCINOMA. - SEE COMMENT. 3. Lung, biopsy, LUL - ADENOCARCINOMA. - SEE  COMMENT. Microscopic Comment 2. The malignant cells are positive for cytokeratin 56 and p63. They are negative for TTF-1. The findings are consistent with squamous cell carcinoma. There is likely insufficient tissue remaining for additional studies, if requested. 3. The malignant cells are positive for TTF-1 and negative for p63 and cytokeratin 5/6. The findings are consistent with adenocarcinoma.    08/10/2016 Procedure    The targets were defined as follows: Left upper lobe nodule, target 1 Lingular nodule, target 2 Proximal right lower lobe nodule, target 3 More distal right lower lobe, nodule  The extendable working channel was secured into place and the locator guide was withdrawn. Under fluoroscopic guidance transbronchial needle brushings, transbronchial Wang needle biopsies, and transbronchial forceps biopsies were performed in the LUL (target 1) and the lingula (target 2) to be sent for cytology and pathology. A bronchioalveolar lavage was performed in the lingula in the vicinity of target 2  and sent for cytology and microbiology (bacterial, fungal, AFB smears and cultures).  Fiducial markers were then placed around target 1, target 2, and in the right lower lobe in the vicinity of both target 3 and target 4 to be used for possible radiation therapy should this be indicated. At the end of the procedure a general airway inspection was performed and there was no evidence of active bleeding. The bronchoscope was removed.  The patient tolerated the procedure well. There was no significant blood loss and there were no obvious complications. A post-procedural chest x-ray is pending.  Samples: 1. Transbronchial needle brushings from targets 1 and 2 2. Transbronchial Wang needle  biopsies from targets 1 and 2 3. Transbronchial forceps biopsies from targets 1 and 2 4. Bronchoalveolar lavage from lingula, target 2 5. Endobronchial biopsies from RUL apical segment 6. Endobronchial brushings from  right upper lobe apical segment     08/10/2016 Genetic Testing    Patient has genetic testing done for [employer] and PD-L1 testing Results revealed PD-L1 testing is negative 0%    08/31/2016 Procedure    Technically successful right IJ power-injectable port catheter placement. Ready for routine use    09/21/2016 - 06/08/2017 Chemotherapy    The patient had chemotherapy with carboplatin and Taxol.    11/30/2016 PET scan    1. Interval resection of the hypermetabolic lingular nodule. 2. The 3 other spiculated, hypermetabolic nodules within the left upper and right lower lobes are slightly smaller with decreased metabolic activity, consistent with some response to interval therapy. 3. No progressive disease    03/06/2017 PET scan    1. Mixed appearance, with significant improvement in the left upper lobe nodule ; stable appearance of the more cephalad right lower lobe nodule; but enlargement and increased in metabolic activity of the more inferior right lower lobe nodule which appears cavitary. No new nodule. 2. Previously there was some moderate increase in diffuse marrow activity. That has increased further, and could be a response from prior granulocyte/marrow stimulation, although I cannot completely exclude the possibility that this is related to the patient's myelodysplastic syndrome. No CT correlate. 3. Other imaging findings of potential clinical significance: Aortic Atherosclerosis (ICD10-I70.0) and Emphysema (ICD10-J43.9). Coronary atherosclerosis. Low-density blood pool suggests anemia. Chronic right upper lobe scarring. Mucus in both mainstem bronchi. Suspected left renal cysts. Indistinct but likely enlarged prostate gland.    04/06/2017 - 04/18/2017 Radiation Therapy    He received SBRT treatment Radiation treatment dates:   04/06/17 - 04/18/17  Site/dose:   Right SBRT Lung// 50 Gy in 5 fx  Beams/energy:   Photon // SRBT/ SBT-3D     06/26/2017 PET scan    1. Clear  interval response to therapy of the 2 right lower lobe pulmonary nodules, each decreased in both size and hypermetabolism. 2. Stable size and hypermetabolic activity in the left upper lobe nodule. 3. Diffusely decreased FDG accumulation within the marrow space. 4. Symmetric uptake in both parotid glands is likely physiologic. Given the symmetry, this is most likely physiologic or inflammatory. 5. Prostatomegaly with marked bladder distention. Component of bladder outlet obstruction suspected. 6. Emphysema. (ICD10-J43.9) 7. Aortic Atherosclerois (ICD10-170.0)    09/25/2017 PET scan    1. New and enlarging small hypermetabolic nodules in the lingula are compatible with metastatic disease. These are in proximity to the linear scarring fiducial markers. 2. New hypermetabolic subcarinal nodal metastasis. 3. Left upper lobe index nodule is stable to slightly progressed in the interval in shows mild increase in hypermetabolism. 4. No substantial change in the 2 index right lower lobe nodules showing only minimal FDG uptake, as before. 5. Interval progression of patchy airspace opacity in both dependent lower lungs, compatible with infection/inflammation.    10/12/2017 - 12/13/2017 Chemotherapy    The patient had atezolizumab for chemotherapy treatment.     01/02/2018 PET scan    2.8 cm left upper lobe nodule, increased, corresponding to known primary bronchogenic neoplasm.  Two lingular nodules measuring up to 11 mm, mildly increased, suspicious for metastases.  12 mm short axis subcarinal node, worrisome for nodal metastasis, grossly unchanged.  Patchy right lower lobe opacity, suspicious for pneumonia. Mild left basilar  opacity, improved.     REVIEW OF SYSTEMS:   Constitutional: Denies fevers, chills or abnormal weight loss Eyes: Denies blurriness of vision Ears, nose, mouth, throat, and face: Denies mucositis or sore throat Cardiovascular: Denies palpitation, chest discomfort or lower  extremity swelling Gastrointestinal:  Denies nausea, heartburn or change in bowel habits Skin: Denies abnormal skin rashes Lymphatics: Denies new lymphadenopathy or easy bruising Neurological:Denies numbness, tingling or new weaknesses Behavioral/Psych: Mood is stable, no new changes  All other systems were reviewed with the patient and are negative.  I have reviewed the past medical history, past surgical history, social history and family history with the patient and they are unchanged from previous note.  ALLERGIES:  is allergic to no known allergies.  MEDICATIONS:  Current Outpatient Medications  Medication Sig Dispense Refill  . albuterol (PROVENTIL HFA;VENTOLIN HFA) 108 (90 Base) MCG/ACT inhaler Inhale 2 puffs into the lungs every 4 (four) hours as needed for wheezing or shortness of breath. 1 Inhaler 5  . ferrous sulfate 325 (65 FE) MG tablet Take 325 mg by mouth daily with breakfast.    . folic acid (FOLVITE) 1 MG tablet Take 1 mg by mouth daily.    . Glycopyrrolate-Formoterol (BEVESPI AEROSPHERE) 9-4.8 MCG/ACT AERO Inhale 2 puffs into the lungs 2 (two) times daily. 2 Inhaler 0  . levETIRAcetam (KEPPRA) 500 MG tablet Take 1 tablet (500 mg total) by mouth 2 (two) times daily. 180 tablet 3  . mirtazapine (REMERON) 30 MG tablet Take 1 tablet (30 mg total) by mouth at bedtime. 30 tablet 11  . predniSONE (DELTASONE) 20 MG tablet Take 1 tablet (20 mg total) by mouth daily with breakfast. 30 tablet 11   No current facility-administered medications for this visit.     PHYSICAL EXAMINATION: ECOG PERFORMANCE STATUS: 2 - Symptomatic, <50% confined to bed  Vitals:   02/28/18 1131  BP: 116/65  Pulse: 82  Resp: 18  Temp: 97.6 F (36.4 C)  SpO2: 100%   Filed Weights   02/28/18 1131  Weight: 104 lb 8 oz (47.4 kg)    GENERAL:alert, no distress and comfortable.  He appears thin and cachectic SKIN: skin color, texture, turgor are normal, no rashes or significant lesions EYES:  normal, Conjunctiva are pale and non-injected, sclera clear OROPHARYNX:no exudate, no erythema and lips, buccal mucosa, and tongue normal  NECK: supple, thyroid normal size, non-tender, without nodularity LYMPH:  no palpable lymphadenopathy in the cervical, axillary or inguinal LUNGS: clear to auscultation and percussion with normal breathing effort HEART: regular rate & rhythm and no murmurs and no lower extremity edema ABDOMEN:abdomen soft, non-tender and normal bowel sounds Musculoskeletal:no cyanosis of digits with digital clubbing NEURO: alert & oriented x 3 with fluent speech, no focal motor/sensory deficits  LABORATORY DATA:  I have reviewed the data as listed    Component Value Date/Time   NA 144 01/31/2018 1046   NA 142 05/03/2017 1114   K 4.4 01/31/2018 1046   K 4.5 05/03/2017 1114   CL 113 (H) 01/31/2018 1046   CL 107 09/19/2012 1325   CO2 25 01/31/2018 1046   CO2 21 (L) 05/03/2017 1114   GLUCOSE 83 01/31/2018 1046   GLUCOSE 88 05/03/2017 1114   GLUCOSE 97 09/19/2012 1325   BUN 20 01/31/2018 1046   BUN 18.5 05/03/2017 1114   CREATININE 0.78 01/31/2018 1046   CREATININE 0.9 05/03/2017 1114   CALCIUM 8.5 (L) 01/31/2018 1046   CALCIUM 8.9 05/03/2017 1114   PROT 8.0 01/31/2018 1046  PROT 8.6 (H) 05/03/2017 1114   ALBUMIN 2.6 (L) 01/31/2018 1046   ALBUMIN 3.2 (L) 05/03/2017 1114   AST 11 (L) 01/31/2018 1046   AST 9 05/03/2017 1114   ALT 7 01/31/2018 1046   ALT <6 05/03/2017 1114   ALKPHOS 94 01/31/2018 1046   ALKPHOS 126 05/03/2017 1114   BILITOT 0.4 01/31/2018 1046   BILITOT 0.34 05/03/2017 1114   GFRNONAA >60 01/31/2018 1046   GFRAA >60 01/31/2018 1046    No results found for: SPEP, UPEP  Lab Results  Component Value Date   WBC 8.3 02/28/2018   NEUTROABS 7.6 02/28/2018   HGB 9.0 (L) 02/28/2018   HCT 28.6 (L) 02/28/2018   MCV 109.2 (H) 02/28/2018   PLT 224 02/28/2018      Chemistry      Component Value Date/Time   NA 144 01/31/2018 1046   NA 142  05/03/2017 1114   K 4.4 01/31/2018 1046   K 4.5 05/03/2017 1114   CL 113 (H) 01/31/2018 1046   CL 107 09/19/2012 1325   CO2 25 01/31/2018 1046   CO2 21 (L) 05/03/2017 1114   BUN 20 01/31/2018 1046   BUN 18.5 05/03/2017 1114   CREATININE 0.78 01/31/2018 1046   CREATININE 0.9 05/03/2017 1114      Component Value Date/Time   CALCIUM 8.5 (L) 01/31/2018 1046   CALCIUM 8.9 05/03/2017 1114   ALKPHOS 94 01/31/2018 1046   ALKPHOS 126 05/03/2017 1114   AST 11 (L) 01/31/2018 1046   AST 9 05/03/2017 1114   ALT 7 01/31/2018 1046   ALT <6 05/03/2017 1114   BILITOT 0.4 01/31/2018 1046   BILITOT 0.34 05/03/2017 1114       All questions were answered. The patient knows to call the clinic with any problems, questions or concerns. No barriers to learning was detected.  I spent 15 minutes counseling the patient face to face. The total time spent in the appointment was 20 minutes and more than 50% was on counseling and review of test results  [name], MD 02/28/2018 11:54 AM

## 2018-03-12 ENCOUNTER — Other Ambulatory Visit: Payer: Self-pay | Admitting: Hematology

## 2018-03-12 NOTE — Progress Notes (Signed)
Adrian [name] OFFICE PROGRESS NOTE  Patient Care Team: Adrian Arabian, MD as PCP - General (Family Medicine) Adrian Lark, MD as Consulting Physician (Hematology and Oncology)  HEME/ONC OVERVIEW: 1. Recurrent metastatic lung cancer -Progressed on multiple lines of therapy, including carbo/Taxol, SBRT, and atezolizumab -No further treatment due to refractory disease and frailty  2. MDS, low-risk, on q2week Aranesp 3. Remote hx of SCCa of the epiglottis   ACTIVE TREATMENTS: 1. q2week Aranesp  2. PRN RBC transfusion for Hgb < 8  ASSESSMENT & PLAN:   Recurrent metastatic lung cancer -Dr. Alvy [name] had discussed with the patient and his family regarding the refractory nature of the lung cancer -Given his overall frailty, it was decided to withhold further chemotherapy and to pursue supportive care only  MDS, low-risks -Likely secondary to chemotherapy treatment in the past   -Hemoglobin remains adequate with every 2 week Aranesp and low-dose prednisone (currently 12m daily) -Hgb 8.5 today, stable  -Proceed with Aranesp injection; no need for RBC transfusion today  Malignant cachexia -Patient is able to continue modest weight gain on oral prednisone -Given that steroid seems to help with his appetite and energy, and he has been tapering off the dose, we will plan to continue prednisone 147mdaily for now   All questions were answered. The patient knows to call the clinic with any problems, questions or concerns. No barriers to learning was detected.  A total of more than 25 minutes were spent face-to-face with the patient during this encounter and over half of that time was spent on counseling and coordination of care as outlined above.   Adrian MenMD 03/14/2018 11:56 AM  CHIEF COMPLAINT: "I am here for my injection"  INTERVAL HISTORY: Mr. McShugarteturns to clinic for lab follow-up and Aranesp injection.  He reports that he has had some intermittent urinary  incontinence, but otherwise denies any new complaint, such as fever, chill, weight loss, night sweats, chest pain, dyspnea, abdominal pain, nausea, vomiting, or diarrhea.  He still smokes approximately 3 cigarettes/day.  SUMMARY OF ONCOLOGIC HISTORY: Oncology History   MDS, R-IPSS score of 3, low risk (Hg 8.9, +16 chromosome on BM, 2% blast count)   Squamous cell carcinoma of the epiglottis   Primary site: Larynx - Supraglottis (Left)   Staging method: AJCC 7th Edition   Clinical: Stage I (T1, N0, M0) signed by Adrian LarkMD on 04/30/2013 12:49 PM   Pathologic: Stage I (T1, N0, cM0) signed by Adrian LarkMD on 04/30/2013 12:49 PM   Summary: Stage I (T1, N0, cM0)       MDS (myelodysplastic syndrome), low grade ([facility]  02/01/2007 Bone Marrow Biopsy    BM biopsy was abnormal, overall probable low grade MDS    03/23/2011 - 02/06/2013 Chemotherapy    [name], discontinued due to diagnosis of laryngeal ca    09/06/2013 Bone Marrow Biopsy    Repeat bone marrow aspirate and biopsy confirmed low-grade myelodysplastic syndrome.    09/17/2013 -  Chemotherapy    The patient resumed darbepoetin injections to treat the anemia.     History of head and neck cancer   03/06/2013 Procedure    [facility]  04/25/2013 Imaging    Ct scan showed no other involvement in the LN    05/13/2013 - 06/28/2013 Radiation Therapy    He received radiation therapy, 70 gray in 35 fractions to the larynx    06/09/2016 Imaging  CT scan of the chest showed Bilateral spiculated soft tissue pulmonary masses, highly suspicious for multifocal malignancy. Borderline enlarged left-sided mediastinal lymphadenopathy. Background of severe emphysematous changes, chronic cylindrical bronchiectasis and hyperinflation of the lungs. Findings consistent with longstanding COPD. Diffusely thickened interstitial markings. This may represent chronic interstitial lung changes, however  lymphangitic spread of malignancy cannot be excluded. Anterior compression deformities of several of the thoracic vertebral bodies, with heterogeneous appearance of T7 and T9 vertebral body. This may represent degenerative changes, however osseous metastatic disease cannot be excluded.     07/11/2016 PET scan    There are 2 nodules in the left upper lobe and 2 nodules within the right lower lobe which have spiculated margin and exhibit intense radiotracer uptake compatible with multifocal pulmonary metastasis versus multiple synchronous primary pulmonary neoplasms. 2. Emphysema 3. Aortic atherosclerosis and multi vessel coronary artery calcification.     Cancer of upper lobe of left lung ([place])   06/09/2016 Imaging    CT chest: Bilateral spiculated soft tissue pulmonary masses, highly suspicious for multifocal malignancy. Borderline enlarged left-sided mediastinal lymphadenopathy. Background of severe emphysematous changes, chronic cylindrical bronchiectasis and hyperinflation of the lungs. Findings consistent with longstanding COPD. Diffusely thickened interstitial markings. This may represent chronic interstitial lung changes, however lymphangitic spread of malignancy cannot be excluded. Anterior compression deformities of several of the thoracic vertebral bodies, with heterogeneous appearance of T7 and T9 vertebral body. This may represent degenerative changes, however osseous metastatic disease cannot be excluded.     06/29/2016 Imaging    MRI brain: No evidence of metastatic disease or recent infarction. Advanced generalized brain atrophy with moderate to marked chronic small-vessel ischemic changes throughout. FLAIR imaging appears to show scattered gyri showing FLAIR hyperintensity. These abnormalities are not confirmed with restricted diffusion or contrast enhancement. Therefore, we are not certain that this is not artifactual. The differential diagnosis does include true pathology such as  posterior reversible encephalopathy, viral or prion disease, postictal change in post chemotherapy change. If there is concern about active CNS pathology, re- scanning in 4-6 weeks could be useful.    07/27/2016 Imaging    CT chest: Bilateral pulmonary lesions. These are suspicious for metastatic disease. Lesions have minimally changed but there may be slight enlargement of the cavitary lesion in the right lower lobe. Severe emphysematous changes.     08/10/2016 Pathology Results    1. Lung, biopsy, RUL, (apical segment) - SCANT BENIGN LUNG PARENCHYMA. - THERE IS NO EVIDENCE OF MALIGNANCY. 2. Lung, biopsy, LUL lingula - SQUAMOUS CELL CARCINOMA. - SEE COMMENT. 3. Lung, biopsy, LUL - ADENOCARCINOMA. - SEE COMMENT. Microscopic Comment 2. The malignant cells are positive for cytokeratin 56 and p63. They are negative for TTF-1. The findings are consistent with squamous cell carcinoma. There is likely insufficient tissue remaining for additional studies, if requested. 3. The malignant cells are positive for TTF-1 and negative for p63 and cytokeratin 5/6. The findings are consistent with adenocarcinoma.    08/10/2016 Procedure    The targets were defined as follows: Left upper lobe nodule, target 1 Lingular nodule, target 2 Proximal right lower lobe nodule, target 3 More distal right lower lobe, nodule  The extendable working channel was secured into place and the locator guide was withdrawn. Under fluoroscopic guidance transbronchial needle brushings, transbronchial Wang needle biopsies, and transbronchial forceps biopsies were performed in the LUL (target 1) and the lingula (target 2) to be sent for cytology and pathology. A bronchioalveolar lavage was performed in the lingula in the vicinity  of target 2  and sent for cytology and microbiology (bacterial, fungal, AFB smears and cultures).  Fiducial markers were then placed around target 1, target 2, and in the right lower lobe in the vicinity of  both target 3 and target 4 to be used for possible radiation therapy should this be indicated. At the end of the procedure a general airway inspection was performed and there was no evidence of active bleeding. The bronchoscope was removed.  The patient tolerated the procedure well. There was no significant blood loss and there were no obvious complications. A post-procedural chest x-ray is pending.  Samples: 1. Transbronchial needle brushings from targets 1 and 2 2. Transbronchial Wang needle biopsies from targets 1 and 2 3. Transbronchial forceps biopsies from targets 1 and 2 4. Bronchoalveolar lavage from lingula, target 2 5. Endobronchial biopsies from RUL apical segment 6. Endobronchial brushings from right upper lobe apical segment     08/10/2016 Genetic Testing    Patient has genetic testing done for [employer] and PD-L1 testing Results revealed PD-L1 testing is negative 0%    08/31/2016 Procedure    Technically successful right IJ power-injectable port catheter placement. Ready for routine use    09/21/2016 - 06/08/2017 Chemotherapy    The patient had chemotherapy with carboplatin and Taxol.    11/30/2016 PET scan    1. Interval resection of the hypermetabolic lingular nodule. 2. The 3 other spiculated, hypermetabolic nodules within the left upper and right lower lobes are slightly smaller with decreased metabolic activity, consistent with some response to interval therapy. 3. No progressive disease    03/06/2017 PET scan    1. Mixed appearance, with significant improvement in the left upper lobe nodule ; stable appearance of the more cephalad right lower lobe nodule; but enlargement and increased in metabolic activity of the more inferior right lower lobe nodule which appears cavitary. No new nodule. 2. Previously there was some moderate increase in diffuse marrow activity. That has increased further, and could be a response from prior granulocyte/marrow stimulation, although I  cannot completely exclude the possibility that this is related to the patient's myelodysplastic syndrome. No CT correlate. 3. Other imaging findings of potential clinical significance: Aortic Atherosclerosis (ICD10-I70.0) and Emphysema (ICD10-J43.9). Coronary atherosclerosis. Low-density blood pool suggests anemia. Chronic right upper lobe scarring. Mucus in both mainstem bronchi. Suspected left renal cysts. Indistinct but likely enlarged prostate gland.    04/06/2017 - 04/18/2017 Radiation Therapy    He received SBRT treatment Radiation treatment dates:   04/06/17 - 04/18/17  Site/dose:   Right SBRT Lung// 50 Gy in 5 fx  Beams/energy:   Photon // SRBT/ SBT-3D     06/26/2017 PET scan    1. Clear interval response to therapy of the 2 right lower lobe pulmonary nodules, each decreased in both size and hypermetabolism. 2. Stable size and hypermetabolic activity in the left upper lobe nodule. 3. Diffusely decreased FDG accumulation within the marrow space. 4. Symmetric uptake in both parotid glands is likely physiologic. Given the symmetry, this is most likely physiologic or inflammatory. 5. Prostatomegaly with marked bladder distention. Component of bladder outlet obstruction suspected. 6. Emphysema. (ICD10-J43.9) 7. Aortic Atherosclerois (ICD10-170.0)    09/25/2017 PET scan    1. New and enlarging small hypermetabolic nodules in the lingula are compatible with metastatic disease. These are in proximity to the linear scarring fiducial markers. 2. New hypermetabolic subcarinal nodal metastasis. 3. Left upper lobe index nodule is stable to slightly progressed in the interval in  shows mild increase in hypermetabolism. 4. No substantial change in the 2 index right lower lobe nodules showing only minimal FDG uptake, as before. 5. Interval progression of patchy airspace opacity in both dependent lower lungs, compatible with infection/inflammation.    10/12/2017 - 12/13/2017 Chemotherapy    The  patient had atezolizumab for chemotherapy treatment.     01/02/2018 PET scan    2.8 cm left upper lobe nodule, increased, corresponding to known primary bronchogenic neoplasm.  Two lingular nodules measuring up to 11 mm, mildly increased, suspicious for metastases.  12 mm short axis subcarinal node, worrisome for nodal metastasis, grossly unchanged.  Patchy right lower lobe opacity, suspicious for pneumonia. Mild left basilar opacity, improved.     REVIEW OF SYSTEMS:   Constitutional: ( - ) fevers, ( - )  chills , ( - ) night sweats Eyes: ( - ) blurriness of vision, ( - ) double vision, ( - ) watery eyes Ears, nose, mouth, throat, and face: ( - ) mucositis, ( - ) sore throat Respiratory: ( - ) cough, ( - ) dyspnea, ( - ) wheezes Cardiovascular: ( - ) palpitation, ( - ) chest discomfort, ( - ) lower extremity swelling Gastrointestinal:  ( - ) nausea, ( - ) heartburn, ( - ) change in bowel habits Skin: ( - ) abnormal skin rashes Lymphatics: ( - ) new lymphadenopathy, ( - ) easy bruising Neurological: ( - ) numbness, ( - ) tingling, ( - ) new weaknesses Behavioral/Psych: ( - ) mood change, ( - ) new changes  All other systems were reviewed with the patient and are negative.  I have reviewed the past medical history, past surgical history, social history and family history with the patient and they are unchanged from previous note.  ALLERGIES:  is allergic to no known allergies.  MEDICATIONS:  Current Outpatient Medications  Medication Sig Dispense Refill  . albuterol (PROVENTIL HFA;VENTOLIN HFA) 108 (90 Base) MCG/ACT inhaler Inhale 2 puffs into the lungs every 4 (four) hours as needed for wheezing or shortness of breath. 1 Inhaler 5  . ferrous sulfate 325 (65 FE) MG tablet Take 325 mg by mouth daily with breakfast.    . folic acid (FOLVITE) 1 MG tablet Take 1 mg by mouth daily.    . Glycopyrrolate-Formoterol (BEVESPI AEROSPHERE) 9-4.8 MCG/ACT AERO Inhale 2 puffs into the lungs 2 (two)  times daily. 2 Inhaler 0  . levETIRAcetam (KEPPRA) 500 MG tablet Take 1 tablet (500 mg total) by mouth 2 (two) times daily. 180 tablet 3  . mirtazapine (REMERON) 30 MG tablet Take 1 tablet (30 mg total) by mouth at bedtime. 30 tablet 11  . predniSONE (DELTASONE) 20 MG tablet Take 1 tablet (20 mg total) by mouth daily with breakfast. (Patient taking differently: Take 10 mg by mouth daily with breakfast. ) 30 tablet 11   Current Facility-Administered Medications  Medication Dose Route Frequency Provider Last Rate Last Dose  . Darbepoetin Alfa (ARANESP) injection 500 mcg  500 mcg Subcutaneous Once Adrian [name], Ni, MD        PHYSICAL EXAMINATION: ECOG PERFORMANCE STATUS: 3 - Symptomatic, >50% confined to bed  Today's Vitals   03/14/18 1139 03/14/18 1141  BP: (!) 99/59   Pulse: 89   Resp: 18   Temp: 97.9 F (36.6 C)   TempSrc: Oral   SpO2: 99%   Weight: 101 lb 3.2 oz (45.9 kg)   Height: 6' (1.829 m)   PainSc:  0-No pain   Body mass  index is 13.73 kg/m.  Filed Weights   03/14/18 1139  Weight: 101 lb 3.2 oz (45.9 kg)    GENERAL: alert, no distress and comfortable, thin/frail, sitting in a wheelchair SKIN: skin color, texture, turgor are normal, no rashes or significant lesions EYES: conjunctiva are pink and non-injected, sclera clear OROPHARYNX: no exudate, no erythema; lips, buccal mucosa, and tongue normal  NECK: supple, non-tender LYMPH:  no palpable lymphadenopathy in the cervical or axillary  LUNGS: clear to auscultation and percussion with normal breathing effort HEART: regular rate & rhythm and no murmurs and no lower extremity edema ABDOMEN: soft, non-tender, non-distended, normal bowel sounds Musculoskeletal: no cyanosis of digits and no clubbing  PSYCH: alert & oriented x 3, fluent speech  LABORATORY DATA:  I have reviewed the data as listed    Component Value Date/Time   NA 144 01/31/2018 1046   NA 142 05/03/2017 1114   K 4.4 01/31/2018 1046   K 4.5 05/03/2017 1114    CL 113 (H) 01/31/2018 1046   CL 107 09/19/2012 1325   CO2 25 01/31/2018 1046   CO2 21 (L) 05/03/2017 1114   GLUCOSE 83 01/31/2018 1046   GLUCOSE 88 05/03/2017 1114   GLUCOSE 97 09/19/2012 1325   BUN 20 01/31/2018 1046   BUN 18.5 05/03/2017 1114   CREATININE 0.78 01/31/2018 1046   CREATININE 0.9 05/03/2017 1114   CALCIUM 8.5 (L) 01/31/2018 1046   CALCIUM 8.9 05/03/2017 1114   PROT 8.0 01/31/2018 1046   PROT 8.6 (H) 05/03/2017 1114   ALBUMIN 2.6 (L) 01/31/2018 1046   ALBUMIN 3.2 (L) 05/03/2017 1114   AST 11 (L) 01/31/2018 1046   AST 9 05/03/2017 1114   ALT 7 01/31/2018 1046   ALT <6 05/03/2017 1114   ALKPHOS 94 01/31/2018 1046   ALKPHOS 126 05/03/2017 1114   BILITOT 0.4 01/31/2018 1046   BILITOT 0.34 05/03/2017 1114   GFRNONAA >60 01/31/2018 1046   GFRAA >60 01/31/2018 1046    No results found for: SPEP, UPEP  Lab Results  Component Value Date   WBC 5.3 03/14/2018   NEUTROABS 4.3 03/14/2018   HGB 8.5 (L) 03/14/2018   HCT 27.2 (L) 03/14/2018   MCV 114.3 (H) 03/14/2018   PLT 239 03/14/2018      Chemistry      Component Value Date/Time   NA 144 01/31/2018 1046   NA 142 05/03/2017 1114   K 4.4 01/31/2018 1046   K 4.5 05/03/2017 1114   CL 113 (H) 01/31/2018 1046   CL 107 09/19/2012 1325   CO2 25 01/31/2018 1046   CO2 21 (L) 05/03/2017 1114   BUN 20 01/31/2018 1046   BUN 18.5 05/03/2017 1114   CREATININE 0.78 01/31/2018 1046   CREATININE 0.9 05/03/2017 1114      Component Value Date/Time   CALCIUM 8.5 (L) 01/31/2018 1046   CALCIUM 8.9 05/03/2017 1114   ALKPHOS 94 01/31/2018 1046   ALKPHOS 126 05/03/2017 1114   AST 11 (L) 01/31/2018 1046   AST 9 05/03/2017 1114   ALT 7 01/31/2018 1046   ALT <6 05/03/2017 1114   BILITOT 0.4 01/31/2018 1046   BILITOT 0.34 05/03/2017 1114

## 2018-03-14 ENCOUNTER — Encounter: Payer: Self-pay | Admitting: Hematology

## 2018-03-14 ENCOUNTER — Ambulatory Visit: Payer: Self-pay | Admitting: Hematology and Oncology

## 2018-03-14 ENCOUNTER — Telehealth: Payer: Self-pay | Admitting: Hematology

## 2018-03-14 ENCOUNTER — Inpatient Hospital Stay: Payer: Medicare Other

## 2018-03-14 ENCOUNTER — Inpatient Hospital Stay (HOSPITAL_BASED_OUTPATIENT_CLINIC_OR_DEPARTMENT_OTHER): Payer: Medicare Other | Admitting: Hematology

## 2018-03-14 ENCOUNTER — Encounter: Payer: Self-pay | Admitting: Nutrition

## 2018-03-14 ENCOUNTER — Inpatient Hospital Stay: Payer: Medicare Other | Attending: Hematology and Oncology

## 2018-03-14 VITALS — BP 99/59 | HR 89 | Temp 97.9°F | Resp 18 | Ht 72.0 in | Wt 101.2 lb

## 2018-03-14 DIAGNOSIS — C3412 Malignant neoplasm of upper lobe, left bronchus or lung: Secondary | ICD-10-CM | POA: Diagnosis not present

## 2018-03-14 DIAGNOSIS — D4620 Refractory anemia with excess of blasts, unspecified: Secondary | ICD-10-CM

## 2018-03-14 DIAGNOSIS — R64 Cachexia: Secondary | ICD-10-CM

## 2018-03-14 DIAGNOSIS — Z923 Personal history of irradiation: Secondary | ICD-10-CM | POA: Diagnosis not present

## 2018-03-14 DIAGNOSIS — Z7952 Long term (current) use of systemic steroids: Secondary | ICD-10-CM | POA: Insufficient documentation

## 2018-03-14 DIAGNOSIS — J449 Chronic obstructive pulmonary disease, unspecified: Secondary | ICD-10-CM

## 2018-03-14 DIAGNOSIS — Z79899 Other long term (current) drug therapy: Secondary | ICD-10-CM | POA: Insufficient documentation

## 2018-03-14 DIAGNOSIS — Z8521 Personal history of malignant neoplasm of larynx: Secondary | ICD-10-CM | POA: Insufficient documentation

## 2018-03-14 DIAGNOSIS — F17210 Nicotine dependence, cigarettes, uncomplicated: Secondary | ICD-10-CM | POA: Diagnosis not present

## 2018-03-14 DIAGNOSIS — R32 Unspecified urinary incontinence: Secondary | ICD-10-CM

## 2018-03-14 DIAGNOSIS — I700 Atherosclerosis of aorta: Secondary | ICD-10-CM | POA: Diagnosis not present

## 2018-03-14 DIAGNOSIS — Z9221 Personal history of antineoplastic chemotherapy: Secondary | ICD-10-CM | POA: Diagnosis not present

## 2018-03-14 DIAGNOSIS — T451X5A Adverse effect of antineoplastic and immunosuppressive drugs, initial encounter: Secondary | ICD-10-CM

## 2018-03-14 DIAGNOSIS — D61818 Other pancytopenia: Secondary | ICD-10-CM

## 2018-03-14 DIAGNOSIS — C801 Malignant (primary) neoplasm, unspecified: Secondary | ICD-10-CM

## 2018-03-14 DIAGNOSIS — D6481 Anemia due to antineoplastic chemotherapy: Secondary | ICD-10-CM

## 2018-03-14 DIAGNOSIS — C7802 Secondary malignant neoplasm of left lung: Secondary | ICD-10-CM

## 2018-03-14 LAB — CBC WITH DIFFERENTIAL/PLATELET
ABS IMMATURE GRANULOCYTES: 0.02 10*3/uL (ref 0.00–0.07)
BASOS PCT: 1 %
Basophils Absolute: 0 10*3/uL (ref 0.0–0.1)
Eosinophils Absolute: 0 10*3/uL (ref 0.0–0.5)
Eosinophils Relative: 1 %
HCT: 27.2 % — ABNORMAL LOW (ref 39.0–52.0)
Hemoglobin: 8.5 g/dL — ABNORMAL LOW (ref 13.0–17.0)
IMMATURE GRANULOCYTES: 0 %
LYMPHS ABS: 0.5 10*3/uL — AB (ref 0.7–4.0)
LYMPHS PCT: 10 %
MCH: 35.7 pg — ABNORMAL HIGH (ref 26.0–34.0)
MCHC: 31.3 g/dL (ref 30.0–36.0)
MCV: 114.3 fL — AB (ref 80.0–100.0)
MONOS PCT: 7 %
Monocytes Absolute: 0.4 10*3/uL (ref 0.1–1.0)
NEUTROS ABS: 4.3 10*3/uL (ref 1.7–7.7)
NEUTROS PCT: 81 %
PLATELETS: 239 10*3/uL (ref 150–400)
RBC: 2.38 MIL/uL — ABNORMAL LOW (ref 4.22–5.81)
WBC: 5.3 10*3/uL (ref 4.0–10.5)
nRBC: 0 % (ref 0.0–0.2)

## 2018-03-14 LAB — SAMPLE TO BLOOD BANK

## 2018-03-14 LAB — IRON AND TIBC
IRON: 114 ug/dL (ref 42–163)
Saturation Ratios: 98 % — ABNORMAL HIGH (ref 20–55)
TIBC: 116 ug/dL — AB (ref 202–409)
UIBC: 2 ug/dL — AB (ref 117–376)

## 2018-03-14 LAB — SEDIMENTATION RATE: Sed Rate: >140 mm/hr — ABNORMAL HIGH (ref 0–16)

## 2018-03-14 LAB — VITAMIN B12: Vitamin B-12: 514 pg/mL (ref 180–914)

## 2018-03-14 LAB — FERRITIN: FERRITIN: 2157 ng/mL — AB (ref 24–336)

## 2018-03-14 MED ORDER — HEPARIN SOD (PORK) LOCK FLUSH 100 UNIT/ML IV SOLN
500.0000 [IU] | Freq: Once | INTRAVENOUS | Status: AC
  Administered 2018-03-14: 500 [IU] via INTRAVENOUS
  Filled 2018-03-14: qty 5

## 2018-03-14 MED ORDER — SODIUM CHLORIDE 0.9% FLUSH
10.0000 mL | INTRAVENOUS | Status: DC | PRN
  Administered 2018-03-14: 10 mL via INTRAVENOUS
  Filled 2018-03-14: qty 10

## 2018-03-14 MED ORDER — DARBEPOETIN ALFA 500 MCG/ML IJ SOSY
500.0000 ug | PREFILLED_SYRINGE | Freq: Once | INTRAMUSCULAR | Status: AC
  Administered 2018-03-14: 500 ug via SUBCUTANEOUS

## 2018-03-14 MED ORDER — DARBEPOETIN ALFA 500 MCG/ML IJ SOSY
PREFILLED_SYRINGE | INTRAMUSCULAR | Status: AC
  Filled 2018-03-14: qty 1

## 2018-03-14 MED ORDER — SODIUM CHLORIDE 0.9% FLUSH
10.0000 mL | Freq: Once | INTRAVENOUS | Status: AC
  Administered 2018-03-14: 10 mL
  Filled 2018-03-14: qty 10

## 2018-03-14 NOTE — Patient Instructions (Signed)
Implanted Port Home Guide An implanted port is a type of central line that is placed under the skin. Central lines are used to provide IV access when treatment or nutrition needs to be given through a person's veins. Implanted ports are used for long-term IV access. An implanted port may be placed because:  You need IV medicine that would be irritating to the small veins in your hands or arms.  You need long-term IV medicines, such as antibiotics.  You need IV nutrition for a long period.  You need frequent blood draws for lab tests.  You need dialysis.  Implanted ports are usually placed in the chest area, but they can also be placed in the upper arm, the abdomen, or the leg. An implanted port has two main parts:  Reservoir. The reservoir is round and will appear as a small, raised area under your skin. The reservoir is the part where a needle is inserted to give medicines or draw blood.  Catheter. The catheter is a thin, flexible tube that extends from the reservoir. The catheter is placed into a large vein. Medicine that is inserted into the reservoir goes into the catheter and then into the vein.  How will I care for my incision site? Do not get the incision site wet. Bathe or shower as directed by your health care provider. How is my port accessed? Special steps must be taken to access the port:  Before the port is accessed, a numbing cream can be placed on the skin. This helps numb the skin over the port site.  Your health care provider uses a sterile technique to access the port. ? Your health care provider must put on a mask and sterile gloves. ? The skin over your port is cleaned carefully with an antiseptic and allowed to dry. ? The port is gently pinched between sterile gloves, and a needle is inserted into the port.  Only "non-coring" port needles should be used to access the port. Once the port is accessed, a blood return should be checked. This helps ensure that the port  is in the vein and is not clogged.  If your port needs to remain accessed for a constant infusion, a clear (transparent) bandage will be placed over the needle site. The bandage and needle will need to be changed every week, or as directed by your health care provider.  Keep the bandage covering the needle clean and dry. Do not get it wet. Follow your health care provider's instructions on how to take a shower or bath while the port is accessed.  If your port does not need to stay accessed, no bandage is needed over the port.  What is flushing? Flushing helps keep the port from getting clogged. Follow your health care provider's instructions on how and when to flush the port. Ports are usually flushed with saline solution or a medicine called heparin. The need for flushing will depend on how the port is used.  If the port is used for intermittent medicines or blood draws, the port will need to be flushed: ? After medicines have been given. ? After blood has been drawn. ? As part of routine maintenance.  If a constant infusion is running, the port may not need to be flushed.  How long will my port stay implanted? The port can stay in for as long as your health care provider thinks it is needed. When it is time for the port to come out, surgery will be   done to remove it. The procedure is similar to the one performed when the port was put in. When should I seek immediate medical care? When you have an implanted port, you should seek immediate medical care if:  You notice a bad smell coming from the incision site.  You have swelling, redness, or drainage at the incision site.  You have more swelling or pain at the port site or the surrounding area.  You have a fever that is not controlled with medicine.  This information is not intended to replace advice given to you by your health care provider. Make sure you discuss any questions you have with your health care provider. Document  Released: 04/25/2005 Document Revised: 10/01/2015 Document Reviewed: 12/31/2012 Elsevier Interactive Patient Education  2017 [employer]  

## 2018-03-14 NOTE — Telephone Encounter (Signed)
No change in appts per 11/6 los

## 2018-03-15 LAB — ERYTHROPOIETIN: Erythropoietin: 911 m[IU]/mL — ABNORMAL HIGH (ref 2.6–18.5)

## 2018-03-26 NOTE — Progress Notes (Signed)
[place] OFFICE PROGRESS NOTE  Patient Care Team: [name], MD as PCP - General (Family Medicine) [name], MD as Consulting Physician (Hematology and Oncology)  HEME/ONC OVERVIEW: 1. Recurrent metastatic lung cancer -Progressed on multiple lines of therapy, including carbo/Taxol, SBRT, and atezolizumab -No further treatment due to refractory disease and frailty  2. MDS, low-risk, on q2week Aranesp 3. Remote hx of SCCa of the epiglottis   ACTIVE TREATMENTS: 1. q2week Aranesp  2. PRN RBC transfusion for Hgb < 8  ASSESSMENT & PLAN:   MDS, low-risk -Likely secondary to chemotherapy treatment in the past   -Hemoglobin remains adequate with every 2 week Aranesp and low-dose prednisone (currently 61m daily) -Hgb 8.6 today, stable  -Proceed with Aranesp injection; no need for RBC transfusion today  Malignant cachexia -Patient is able to continue modest weight gain on oral prednisone -Patient spells reports that steroids seem to help with his appetite and energy; he is currently on prednisone 5 mg daily -We will plan to continue low-dose prednisone for now  Recurrent metastatic lung cancer -Dr. GAlvy Bimlerhad discussed with the patient and his family regarding the refractory nature of the lung cancer -Given his overall frailty, it was decided to withhold further chemotherapy and to pursue supportive care only  All questions were answered. The patient knows to call the clinic with any problems, questions or concerns. No barriers to learning was detected.  A total of more than 25 minutes were spent face-to-face with the patient during this encounter and over half of that time was spent on counseling and coordination of care as outlined above.   YTish Men MD 03/28/2018 11:53 AM  CHIEF COMPLAINT: "I am doing okay"  INTERVAL HISTORY: Mr. MMutschlerreturns to clinic for lab follow-up and Aranesp injection.  He reports that he is energy level is about the same,  and that he spends most of the day watching TV or lying in bed at home.  However, according to his wife, he has been sleeping more during the day, and has less energy and appetite at home.  He is currently on prednisone 5 mg daily.  He denies any fever, chill, weight change, lymphadenopathy, chest pain, dyspnea, abdominal pain, nausea, vomiting, or diarrhea.  SUMMARY OF ONCOLOGIC HISTORY: Oncology History   MDS, R-IPSS score of 3, low risk (Hg 8.9, +16 chromosome on BM, 2% blast count)   Squamous cell carcinoma of the epiglottis   Primary site: Larynx - Supraglottis (Left)   Staging method: AJCC 7th Edition   Clinical: Stage I (T1, N0, M0) signed by NHeath Lark MD on 04/30/2013 12:49 PM   Pathologic: Stage I (T1, N0, cM0) signed by NHeath Lark MD on 04/30/2013 12:49 PM   Summary: Stage I (T1, N0, cM0)       MDS (myelodysplastic syndrome), low grade ([facility]   02/01/2007 Bone Marrow Biopsy    BM biopsy was abnormal, overall probable low grade MDS    03/23/2011 - 02/06/2013 Chemotherapy    [name], discontinued due to diagnosis of laryngeal ca    09/06/2013 Bone Marrow Biopsy    Repeat bone marrow aspirate and biopsy confirmed low-grade myelodysplastic syndrome.    09/17/2013 -  Chemotherapy    The patient resumed darbepoetin injections to treat the anemia.     History of head and neck cancer   03/06/2013 Procedure    [facility]   04/25/2013 Imaging    Ct scan showed no other  involvement in the LN    05/13/2013 - 06/28/2013 Radiation Therapy    He received radiation therapy, 70 gray in 35 fractions to the larynx    06/09/2016 Imaging    CT scan of the chest showed Bilateral spiculated soft tissue pulmonary masses, highly suspicious for multifocal malignancy. Borderline enlarged left-sided mediastinal lymphadenopathy. Background of severe emphysematous changes, chronic cylindrical bronchiectasis and hyperinflation of the lungs.  Findings consistent with longstanding COPD. Diffusely thickened interstitial markings. This may represent chronic interstitial lung changes, however lymphangitic spread of malignancy cannot be excluded. Anterior compression deformities of several of the thoracic vertebral bodies, with heterogeneous appearance of T7 and T9 vertebral body. This may represent degenerative changes, however osseous metastatic disease cannot be excluded.     07/11/2016 PET scan    There are 2 nodules in the left upper lobe and 2 nodules within the right lower lobe which have spiculated margin and exhibit intense radiotracer uptake compatible with multifocal pulmonary metastasis versus multiple synchronous primary pulmonary neoplasms. 2. Emphysema 3. Aortic atherosclerosis and multi vessel coronary artery calcification.     Cancer of upper lobe of left lung ([place])   06/09/2016 Imaging    CT chest: Bilateral spiculated soft tissue pulmonary masses, highly suspicious for multifocal malignancy. Borderline enlarged left-sided mediastinal lymphadenopathy. Background of severe emphysematous changes, chronic cylindrical bronchiectasis and hyperinflation of the lungs. Findings consistent with longstanding COPD. Diffusely thickened interstitial markings. This may represent chronic interstitial lung changes, however lymphangitic spread of malignancy cannot be excluded. Anterior compression deformities of several of the thoracic vertebral bodies, with heterogeneous appearance of T7 and T9 vertebral body. This may represent degenerative changes, however osseous metastatic disease cannot be excluded.     06/29/2016 Imaging    MRI brain: No evidence of metastatic disease or recent infarction. Advanced generalized brain atrophy with moderate to marked chronic small-vessel ischemic changes throughout. FLAIR imaging appears to show scattered gyri showing FLAIR hyperintensity. These abnormalities are not confirmed with restricted diffusion or  contrast enhancement. Therefore, we are not certain that this is not artifactual. The differential diagnosis does include true pathology such as posterior reversible encephalopathy, viral or prion disease, postictal change in post chemotherapy change. If there is concern about active CNS pathology, re- scanning in 4-6 weeks could be useful.    07/27/2016 Imaging    CT chest: Bilateral pulmonary lesions. These are suspicious for metastatic disease. Lesions have minimally changed but there may be slight enlargement of the cavitary lesion in the right lower lobe. Severe emphysematous changes.     08/10/2016 Pathology Results    1. Lung, biopsy, RUL, (apical segment) - SCANT BENIGN LUNG PARENCHYMA. - THERE IS NO EVIDENCE OF MALIGNANCY. 2. Lung, biopsy, LUL lingula - SQUAMOUS CELL CARCINOMA. - SEE COMMENT. 3. Lung, biopsy, LUL - ADENOCARCINOMA. - SEE COMMENT. Microscopic Comment 2. The malignant cells are positive for cytokeratin 56 and p63. They are negative for TTF-1. The findings are consistent with squamous cell carcinoma. There is likely insufficient tissue remaining for additional studies, if requested. 3. The malignant cells are positive for TTF-1 and negative for p63 and cytokeratin 5/6. The findings are consistent with adenocarcinoma.    08/10/2016 Procedure    The targets were defined as follows: Left upper lobe nodule, target 1 Lingular nodule, target 2 Proximal right lower lobe nodule, target 3 More distal right lower lobe, nodule  The extendable working channel was secured into place and the locator guide was withdrawn. Under fluoroscopic guidance transbronchial needle brushings, transbronchial Wang needle  biopsies, and transbronchial forceps biopsies were performed in the LUL (target 1) and the lingula (target 2) to be sent for cytology and pathology. A bronchioalveolar lavage was performed in the lingula in the vicinity of target 2  and sent for cytology and microbiology  (bacterial, fungal, AFB smears and cultures).  Fiducial markers were then placed around target 1, target 2, and in the right lower lobe in the vicinity of both target 3 and target 4 to be used for possible radiation therapy should this be indicated. At the end of the procedure a general airway inspection was performed and there was no evidence of active bleeding. The bronchoscope was removed.  The patient tolerated the procedure well. There was no significant blood loss and there were no obvious complications. A post-procedural chest x-ray is pending.  Samples: 1. Transbronchial needle brushings from targets 1 and 2 2. Transbronchial Wang needle biopsies from targets 1 and 2 3. Transbronchial forceps biopsies from targets 1 and 2 4. Bronchoalveolar lavage from lingula, target 2 5. Endobronchial biopsies from RUL apical segment 6. Endobronchial brushings from right upper lobe apical segment     08/10/2016 Genetic Testing    Patient has genetic testing done for [employer] and PD-L1 testing Results revealed PD-L1 testing is negative 0%    08/31/2016 Procedure    Technically successful right IJ power-injectable port catheter placement. Ready for routine use    09/21/2016 - 06/08/2017 Chemotherapy    The patient had chemotherapy with carboplatin and Taxol.    11/30/2016 PET scan    1. Interval resection of the hypermetabolic lingular nodule. 2. The 3 other spiculated, hypermetabolic nodules within the left upper and right lower lobes are slightly smaller with decreased metabolic activity, consistent with some response to interval therapy. 3. No progressive disease    03/06/2017 PET scan    1. Mixed appearance, with significant improvement in the left upper lobe nodule ; stable appearance of the more cephalad right lower lobe nodule; but enlargement and increased in metabolic activity of the more inferior right lower lobe nodule which appears cavitary. No new nodule. 2. Previously there was  some moderate increase in diffuse marrow activity. That has increased further, and could be a response from prior granulocyte/marrow stimulation, although I cannot completely exclude the possibility that this is related to the patient's myelodysplastic syndrome. No CT correlate. 3. Other imaging findings of potential clinical significance: Aortic Atherosclerosis (ICD10-I70.0) and Emphysema (ICD10-J43.9). Coronary atherosclerosis. Low-density blood pool suggests anemia. Chronic right upper lobe scarring. Mucus in both mainstem bronchi. Suspected left renal cysts. Indistinct but likely enlarged prostate gland.    04/06/2017 - 04/18/2017 Radiation Therapy    He received SBRT treatment Radiation treatment dates:   04/06/17 - 04/18/17  Site/dose:   Right SBRT Lung// 50 Gy in 5 fx  Beams/energy:   Photon // SRBT/ SBT-3D     06/26/2017 PET scan    1. Clear interval response to therapy of the 2 right lower lobe pulmonary nodules, each decreased in both size and hypermetabolism. 2. Stable size and hypermetabolic activity in the left upper lobe nodule. 3. Diffusely decreased FDG accumulation within the marrow space. 4. Symmetric uptake in both parotid glands is likely physiologic. Given the symmetry, this is most likely physiologic or inflammatory. 5. Prostatomegaly with marked bladder distention. Component of bladder outlet obstruction suspected. 6. Emphysema. (ICD10-J43.9) 7. Aortic Atherosclerois (ICD10-170.0)    09/25/2017 PET scan    1. New and enlarging small hypermetabolic nodules in the lingula are  compatible with metastatic disease. These are in proximity to the linear scarring fiducial markers. 2. New hypermetabolic subcarinal nodal metastasis. 3. Left upper lobe index nodule is stable to slightly progressed in the interval in shows mild increase in hypermetabolism. 4. No substantial change in the 2 index right lower lobe nodules showing only minimal FDG uptake, as before. 5. Interval  progression of patchy airspace opacity in both dependent lower lungs, compatible with infection/inflammation.    10/12/2017 - 12/13/2017 Chemotherapy    The patient had atezolizumab for chemotherapy treatment.     01/02/2018 PET scan    2.8 cm left upper lobe nodule, increased, corresponding to known primary bronchogenic neoplasm.  Two lingular nodules measuring up to 11 mm, mildly increased, suspicious for metastases.  12 mm short axis subcarinal node, worrisome for nodal metastasis, grossly unchanged.  Patchy right lower lobe opacity, suspicious for pneumonia. Mild left basilar opacity, improved.     REVIEW OF SYSTEMS:   Constitutional: ( - ) fevers, ( - )  chills , ( - ) night sweats Eyes: ( - ) blurriness of vision, ( - ) double vision, ( - ) watery eyes Ears, nose, mouth, throat, and face: ( - ) mucositis, ( - ) sore throat Respiratory: ( - ) cough, ( - ) dyspnea, ( - ) wheezes Cardiovascular: ( - ) palpitation, ( - ) chest discomfort, ( - ) lower extremity swelling Gastrointestinal:  ( - ) nausea, ( - ) heartburn, ( - ) change in bowel habits Skin: ( - ) abnormal skin rashes Lymphatics: ( - ) new lymphadenopathy, ( - ) easy bruising Neurological: ( - ) numbness, ( - ) tingling, ( - ) new weaknesses Behavioral/Psych: ( - ) mood change, ( - ) new changes  All other systems were reviewed with the patient and are negative.  I have reviewed the past medical history, past surgical history, social history and family history with the patient and they are unchanged from previous note.  ALLERGIES:  is allergic to no known allergies.  MEDICATIONS:  Current Outpatient Medications  Medication Sig Dispense Refill  . albuterol (PROVENTIL HFA;VENTOLIN HFA) 108 (90 Base) MCG/ACT inhaler Inhale 2 puffs into the lungs every 4 (four) hours as needed for wheezing or shortness of breath. 1 Inhaler 5  . ferrous sulfate 325 (65 FE) MG tablet Take 325 mg by mouth daily with breakfast.    . folic acid  (FOLVITE) 1 MG tablet Take 1 mg by mouth daily.    . Glycopyrrolate-Formoterol (BEVESPI AEROSPHERE) 9-4.8 MCG/ACT AERO Inhale 2 puffs into the lungs 2 (two) times daily. 2 Inhaler 0  . levETIRAcetam (KEPPRA) 500 MG tablet Take 1 tablet (500 mg total) by mouth 2 (two) times daily. 180 tablet 3  . mirtazapine (REMERON) 30 MG tablet Take 1 tablet (30 mg total) by mouth at bedtime. 30 tablet 11  . predniSONE (DELTASONE) 20 MG tablet Take 1 tablet (20 mg total) by mouth daily with breakfast. (Patient taking differently: Take 10 mg by mouth daily with breakfast. ) 30 tablet 11   Current Facility-Administered Medications  Medication Dose Route Frequency Provider Last Rate Last Dose  . heparin lock flush 100 unit/mL  500 Units Intravenous Once Tish Men, MD      . sodium chloride flush (NS) 0.9 % injection 10 mL  10 mL Intravenous PRN Tish Men, MD        PHYSICAL EXAMINATION: ECOG PERFORMANCE STATUS: 3 - Symptomatic, >50% confined to bed  Today's Vitals  03/28/18 1117 03/28/18 1118  BP: (!) 93/57   Pulse: 93   Resp: 17   Temp: (!) 97.5 F (36.4 C)   TempSrc: Oral   SpO2: 96%   Weight: 98 lb 9.6 oz (44.7 kg)   Height: 6' (1.829 m)   PainSc:  0-No pain   Body mass index is 13.37 kg/m.  Filed Weights   03/28/18 1117  Weight: 98 lb 9.6 oz (44.7 kg)    GENERAL: alert, no distress and comfortable, thin/frail, sitting in a wheelchair SKIN: skin color, texture, turgor are normal, no rashes or significant lesions EYES: conjunctiva are pink and non-injected, sclera clear OROPHARYNX: no exudate, no erythema; lips, buccal mucosa, and tongue normal  NECK: supple, non-tender LYMPH:  no palpable lymphadenopathy in the cervical or axillary  LUNGS: clear to auscultation and percussion with normal breathing effort HEART: regular rate & rhythm and no murmurs and no lower extremity edema ABDOMEN: soft, non-tender, non-distended, normal bowel sounds Musculoskeletal: no cyanosis of digits and no  clubbing  PSYCH: alert & oriented x 3, fluent speech  LABORATORY DATA:  I have reviewed the data as listed    Component Value Date/Time   NA 144 01/31/2018 1046   NA 142 05/03/2017 1114   K 4.4 01/31/2018 1046   K 4.5 05/03/2017 1114   CL 113 (H) 01/31/2018 1046   CL 107 09/19/2012 1325   CO2 25 01/31/2018 1046   CO2 21 (L) 05/03/2017 1114   GLUCOSE 83 01/31/2018 1046   GLUCOSE 88 05/03/2017 1114   GLUCOSE 97 09/19/2012 1325   BUN 20 01/31/2018 1046   BUN 18.5 05/03/2017 1114   CREATININE 0.78 01/31/2018 1046   CREATININE 0.9 05/03/2017 1114   CALCIUM 8.5 (L) 01/31/2018 1046   CALCIUM 8.9 05/03/2017 1114   PROT 8.0 01/31/2018 1046   PROT 8.6 (H) 05/03/2017 1114   ALBUMIN 2.6 (L) 01/31/2018 1046   ALBUMIN 3.2 (L) 05/03/2017 1114   AST 11 (L) 01/31/2018 1046   AST 9 05/03/2017 1114   ALT 7 01/31/2018 1046   ALT <6 05/03/2017 1114   ALKPHOS 94 01/31/2018 1046   ALKPHOS 126 05/03/2017 1114   BILITOT 0.4 01/31/2018 1046   BILITOT 0.34 05/03/2017 1114   GFRNONAA >60 01/31/2018 1046   GFRAA >60 01/31/2018 1046    No results found for: SPEP, UPEP  Lab Results  Component Value Date   WBC 7.4 03/28/2018   NEUTROABS 6.3 03/28/2018   HGB 8.6 (L) 03/28/2018   HCT 28.2 (L) 03/28/2018   MCV 120.0 (H) 03/28/2018   PLT 245 03/28/2018      Chemistry      Component Value Date/Time   NA 144 01/31/2018 1046   NA 142 05/03/2017 1114   K 4.4 01/31/2018 1046   K 4.5 05/03/2017 1114   CL 113 (H) 01/31/2018 1046   CL 107 09/19/2012 1325   CO2 25 01/31/2018 1046   CO2 21 (L) 05/03/2017 1114   BUN 20 01/31/2018 1046   BUN 18.5 05/03/2017 1114   CREATININE 0.78 01/31/2018 1046   CREATININE 0.9 05/03/2017 1114      Component Value Date/Time   CALCIUM 8.5 (L) 01/31/2018 1046   CALCIUM 8.9 05/03/2017 1114   ALKPHOS 94 01/31/2018 1046   ALKPHOS 126 05/03/2017 1114   AST 11 (L) 01/31/2018 1046   AST 9 05/03/2017 1114   ALT 7 01/31/2018 1046   ALT <6 05/03/2017 1114    BILITOT 0.4 01/31/2018 1046   BILITOT 0.34  05/03/2017 1114

## 2018-03-28 ENCOUNTER — Inpatient Hospital Stay: Payer: Medicare Other | Admitting: Nutrition

## 2018-03-28 ENCOUNTER — Inpatient Hospital Stay: Payer: Medicare Other

## 2018-03-28 ENCOUNTER — Inpatient Hospital Stay (HOSPITAL_BASED_OUTPATIENT_CLINIC_OR_DEPARTMENT_OTHER): Payer: Medicare Other | Admitting: Hematology

## 2018-03-28 ENCOUNTER — Ambulatory Visit: Payer: Self-pay | Admitting: Hematology and Oncology

## 2018-03-28 VITALS — BP 93/57 | HR 93 | Temp 97.5°F | Resp 17 | Ht 72.0 in | Wt 98.6 lb

## 2018-03-28 DIAGNOSIS — D4620 Refractory anemia with excess of blasts, unspecified: Secondary | ICD-10-CM

## 2018-03-28 DIAGNOSIS — Z79899 Other long term (current) drug therapy: Secondary | ICD-10-CM

## 2018-03-28 DIAGNOSIS — R64 Cachexia: Secondary | ICD-10-CM

## 2018-03-28 DIAGNOSIS — Z923 Personal history of irradiation: Secondary | ICD-10-CM | POA: Diagnosis not present

## 2018-03-28 DIAGNOSIS — Z9221 Personal history of antineoplastic chemotherapy: Secondary | ICD-10-CM | POA: Diagnosis not present

## 2018-03-28 DIAGNOSIS — F17210 Nicotine dependence, cigarettes, uncomplicated: Secondary | ICD-10-CM

## 2018-03-28 DIAGNOSIS — J449 Chronic obstructive pulmonary disease, unspecified: Secondary | ICD-10-CM

## 2018-03-28 DIAGNOSIS — I700 Atherosclerosis of aorta: Secondary | ICD-10-CM

## 2018-03-28 DIAGNOSIS — R32 Unspecified urinary incontinence: Secondary | ICD-10-CM

## 2018-03-28 DIAGNOSIS — C3412 Malignant neoplasm of upper lobe, left bronchus or lung: Secondary | ICD-10-CM

## 2018-03-28 DIAGNOSIS — D61818 Other pancytopenia: Secondary | ICD-10-CM

## 2018-03-28 DIAGNOSIS — Z7952 Long term (current) use of systemic steroids: Secondary | ICD-10-CM

## 2018-03-28 DIAGNOSIS — Z8521 Personal history of malignant neoplasm of larynx: Secondary | ICD-10-CM

## 2018-03-28 LAB — CBC WITH DIFFERENTIAL/PLATELET
Abs Immature Granulocytes: 0.04 10*3/uL (ref 0.00–0.07)
Basophils Absolute: 0 10*3/uL (ref 0.0–0.1)
Basophils Relative: 0 %
Eosinophils Absolute: 0 10*3/uL (ref 0.0–0.5)
Eosinophils Relative: 0 %
HCT: 28.2 % — ABNORMAL LOW (ref 39.0–52.0)
Hemoglobin: 8.6 g/dL — ABNORMAL LOW (ref 13.0–17.0)
Immature Granulocytes: 1 %
Lymphocytes Relative: 7 %
Lymphs Abs: 0.5 10*3/uL — ABNORMAL LOW (ref 0.7–4.0)
MCH: 36.6 pg — ABNORMAL HIGH (ref 26.0–34.0)
MCHC: 30.5 g/dL (ref 30.0–36.0)
MCV: 120 fL — ABNORMAL HIGH (ref 80.0–100.0)
Monocytes Absolute: 0.4 10*3/uL (ref 0.1–1.0)
Monocytes Relative: 6 %
Neutro Abs: 6.3 10*3/uL (ref 1.7–7.7)
Neutrophils Relative %: 86 %
Platelets: 245 10*3/uL (ref 150–400)
RBC: 2.35 MIL/uL — ABNORMAL LOW (ref 4.22–5.81)
WBC: 7.4 10*3/uL (ref 4.0–10.5)
nRBC: 0 % (ref 0.0–0.2)

## 2018-03-28 LAB — SAMPLE TO BLOOD BANK

## 2018-03-28 MED ORDER — SODIUM CHLORIDE 0.9% FLUSH
10.0000 mL | INTRAVENOUS | Status: DC | PRN
  Administered 2018-03-28: 10 mL via INTRAVENOUS
  Filled 2018-03-28: qty 10

## 2018-03-28 MED ORDER — SODIUM CHLORIDE 0.9% FLUSH
10.0000 mL | Freq: Once | INTRAVENOUS | Status: AC
  Administered 2018-03-28: 10 mL
  Filled 2018-03-28: qty 10

## 2018-03-28 MED ORDER — DARBEPOETIN ALFA 500 MCG/ML IJ SOSY
PREFILLED_SYRINGE | INTRAMUSCULAR | Status: AC
  Filled 2018-03-28: qty 1

## 2018-03-28 MED ORDER — DARBEPOETIN ALFA 500 MCG/ML IJ SOSY
500.0000 ug | PREFILLED_SYRINGE | Freq: Once | INTRAMUSCULAR | Status: AC
  Administered 2018-03-28: 500 ug via SUBCUTANEOUS

## 2018-03-28 MED ORDER — HEPARIN SOD (PORK) LOCK FLUSH 100 UNIT/ML IV SOLN
500.0000 [IU] | Freq: Once | INTRAVENOUS | Status: AC
  Administered 2018-03-28: 500 [IU] via INTRAVENOUS
  Filled 2018-03-28: qty 5

## 2018-03-29 ENCOUNTER — Telehealth: Payer: Self-pay | Admitting: Hematology

## 2018-03-29 NOTE — Telephone Encounter (Signed)
No los per 11/20

## 2018-03-31 ENCOUNTER — Ambulatory Visit (HOSPITAL_COMMUNITY)
Admission: EM | Admit: 2018-03-31 | Discharge: 2018-03-31 | Disposition: A | Discharge status: Other Acute Inpatient Hospital | Payer: Medicare Other | Source: Home / Self Care

## 2018-03-31 ENCOUNTER — Other Ambulatory Visit: Payer: Self-pay

## 2018-03-31 ENCOUNTER — Emergency Department (HOSPITAL_COMMUNITY)
Admission: EM | Admit: 2018-03-31 | Discharge: 2018-03-31 | Disposition: A | Discharge status: Home / Self Care | Payer: Medicare Other | Attending: Emergency Medicine | Admitting: Emergency Medicine

## 2018-03-31 DIAGNOSIS — C329 Malignant neoplasm of larynx, unspecified: Secondary | ICD-10-CM | POA: Diagnosis not present

## 2018-03-31 DIAGNOSIS — J449 Chronic obstructive pulmonary disease, unspecified: Secondary | ICD-10-CM | POA: Diagnosis not present

## 2018-03-31 DIAGNOSIS — Z79899 Other long term (current) drug therapy: Secondary | ICD-10-CM | POA: Diagnosis not present

## 2018-03-31 DIAGNOSIS — Z87891 Personal history of nicotine dependence: Secondary | ICD-10-CM | POA: Insufficient documentation

## 2018-03-31 DIAGNOSIS — L89151 Pressure ulcer of sacral region, stage 1: Secondary | ICD-10-CM | POA: Diagnosis not present

## 2018-03-31 DIAGNOSIS — C3490 Malignant neoplasm of unspecified part of unspecified bronchus or lung: Secondary | ICD-10-CM | POA: Diagnosis not present

## 2018-03-31 DIAGNOSIS — K625 Hemorrhage of anus and rectum: Secondary | ICD-10-CM

## 2018-03-31 DIAGNOSIS — L89159 Pressure ulcer of sacral region, unspecified stage: Secondary | ICD-10-CM | POA: Insufficient documentation

## 2018-03-31 LAB — TYPE AND SCREEN
ABO/RH(D): O POS
ANTIBODY SCREEN: NEGATIVE

## 2018-03-31 LAB — COMPREHENSIVE METABOLIC PANEL
ALK PHOS: 75 U/L (ref 38–126)
ALT: 9 U/L (ref 0–44)
AST: 14 U/L — ABNORMAL LOW (ref 15–41)
Albumin: 3.3 g/dL — ABNORMAL LOW (ref 3.5–5.0)
Anion gap: 9 (ref 5–15)
BILIRUBIN TOTAL: 0.4 mg/dL (ref 0.3–1.2)
BUN: 34 mg/dL — ABNORMAL HIGH (ref 8–23)
CO2: 23 mmol/L (ref 22–32)
Calcium: 8.8 mg/dL — ABNORMAL LOW (ref 8.9–10.3)
Chloride: 109 mmol/L (ref 98–111)
Creatinine, Ser: 0.9 mg/dL (ref 0.61–1.24)
GFR calc non Af Amer: >60 mL/min (ref >60)
Glucose, Bld: 94 mg/dL (ref 70–99)
Potassium: 4.4 mmol/L (ref 3.5–5.1)
Sodium: 141 mmol/L (ref 135–145)
TOTAL PROTEIN: 8.4 g/dL — AB (ref 6.5–8.1)

## 2018-03-31 LAB — CBC
HCT: 29.5 % — ABNORMAL LOW (ref 39.0–52.0)
HEMOGLOBIN: 8.8 g/dL — AB (ref 13.0–17.0)
MCH: 37.3 pg — ABNORMAL HIGH (ref 26.0–34.0)
MCHC: 29.8 g/dL — AB (ref 30.0–36.0)
MCV: 125 fL — ABNORMAL HIGH (ref 80.0–100.0)
PLATELETS: 257 10*3/uL (ref 150–400)
RBC: 2.36 MIL/uL — ABNORMAL LOW (ref 4.22–5.81)
RDW: 25.1 % — ABNORMAL HIGH (ref 11.5–15.5)
WBC: 9.4 10*3/uL (ref 4.0–10.5)
nRBC: 0 % (ref 0.0–0.2)

## 2018-03-31 LAB — POC OCCULT BLOOD, ED: Fecal Occult Bld: NEGATIVE

## 2018-03-31 NOTE — ED Provider Notes (Signed)
[facility] DEPT Provider Note   CSN: [id number] Arrival date & time: 03/31/18  1342     History   Chief Complaint Chief Complaint  Patient presents with  . Rectal Bleeding    HPI ARAM DOMZALSKI is a 67 y.o. male.  HPI   He presents for evaluation of rectal bleeding which was first noticed today, his wife saw a large amount of blood in the commode after he had a bowel movement.  No prior similar episodes.  Patient has been generally weak and had decreased appetite for about a month.  He has been treated for cancer in the past but since the treatment was not working, all interventions were stopped.  His wife states that they are going to "just see what happens."  There have been no end-of-life discussions or plans, apparently.  No recent fever, chills, cough or vomiting.  He ate some breakfast this morning but did not eat any lunch.  There are no other known modifying factors.  Past Medical History:  Diagnosis Date  . Anxiety   . Arthritis    rheumatoid  . Cancer ([place]) 03/11/2013   larnyx/epiglottic Squamous cell in situ  . Emphysema of lung ([place])   . Esophageal reflux    not current  . History of blood transfusion   . History of radiation therapy 05/13/2013-06/28/2013   70 gray to epiglottis/neck  . History of radiation therapy 04/06/17-04/18/17   right SBRT lung 50 Gy in 5 fractions  . Megaloblastic anemia   . Pneumonia 01/2013  . Seizures ([place])    last one was 8 years ago- 2008  . Shortness of breath    with exertion  . Throat pain 06/13/2013    Patient Active Problem List   Diagnosis Date Noted  . Dysphagia 12/13/2017  . Thrombocytopenia ([place]) 01/12/2017  . Anemia due to antineoplastic chemotherapy 10/19/2016  . Goals of care, counseling/discussion 09/08/2016  . Cancer of upper lobe of left lung ([place]) 08/17/2016  . Metastasis to lung of unknown origin, left ([place]) 07/11/2016  . Malignant cachexia ([place]) 07/11/2016  . Lung mass  06/14/2016  . Oropharyngeal candidiasis 06/14/2016  . Adenomatous colon polyp 12/26/2014  . Tobacco abuse 08/12/2014  . Memory loss 12/25/2013  . Mild cognitive impairment 12/25/2013  . Hyperkalemia 07/03/2013  . History of head and neck cancer 04/12/2013  . COPD, mild ([place]) 01/23/2013  . Dehydration with hyponatremia 01/23/2013  . Elevated d-dimer 01/23/2013  . Seizure disorder ([place]) 01/23/2013  . Anemia 03/13/2011  . MDS (myelodysplastic syndrome), low grade ([place]) 03/13/2011    Past Surgical History:  Procedure Laterality Date  . ABDOMINAL HERNIA REPAIR    . COLONOSCOPY    . IR FLUORO GUIDE PORT INSERTION RIGHT  08/31/2016  . IR US GUIDE VASC ACCESS RIGHT  08/31/2016  . MINOR PLACEMENT OF FIDUCIAL  08/10/2016   Procedure: MINOR PLACEMENT OF FIDUCIAL x3;  Surgeon: Collene Gobble, MD;  Location: [facility];  Service: Thoracic;;  . MULTIPLE EXTRACTIONS WITH ALVEOLOPLASTY N/A 04/16/2013   Procedure: Extraction of tooth #'s 2,5,6,7,10,11,15,17,21,22,27,29 with alveoloplasty, bilateral maxillary fibrous tuberosity reductions, removal of excess tissues of mandibular arch, and mandibular left lingual exostoses reductions.;  Surgeon: Lenn Cal, DDS;  Location: [facility];  Service: Oral Surgery;  Laterality: N/A;  . PEG PLACEMENT  2014  . tracheostomy  2014  . VIDEO BRONCHOSCOPY WITH ENDOBRONCHIAL NAVIGATION N/A 08/10/2016   Procedure: VIDEO BRONCHOSCOPY WITH ENDOBRONCHIAL NAVIGATION;  Surgeon: Collene Gobble, MD;  Location: [facility];  Service: Thoracic;  Laterality: N/A;        Home Medications    Prior to Admission medications   Medication Sig Start Date End Date Taking? Authorizing Provider  albuterol (PROVENTIL HFA;VENTOLIN HFA) 108 (90 Base) MCG/ACT inhaler Inhale 2 puffs into the lungs every 4 (four) hours as needed for wheezing or shortness of breath. 11/04/16   Byrum, Rose Fillers, MD  ferrous sulfate 325 (65 FE) MG tablet Take 325 mg by mouth daily with breakfast.    [provider]   folic acid (FOLVITE) 1 MG tablet Take 1 mg by mouth daily.    [provider]  Glycopyrrolate-Formoterol (BEVESPI AEROSPHERE) 9-4.8 MCG/ACT AERO Inhale 2 puffs into the lungs 2 (two) times daily. 11/04/16   Collene Gobble, MD  levETIRAcetam (KEPPRA) 500 MG tablet Take 1 tablet (500 mg total) by mouth 2 (two) times daily. 01/15/18   Garvin Fila, MD  mirtazapine (REMERON) 30 MG tablet Take 1 tablet (30 mg total) by mouth at bedtime. 01/18/18   [name], MD  predniSONE (DELTASONE) 20 MG tablet Take 1 tablet (20 mg total) by mouth daily with breakfast. Patient taking differently: Take 10 mg by mouth daily with breakfast.  02/14/18   [name], MD    Family History Family History  Problem Relation Age of Onset  . Hypertension Mother   . Arthritis/Rheumatoid Mother   . Stroke Mother     Social History Social History   Tobacco Use  . Smoking status: Former Smoker    Packs/day: 0.12    Years: 40.00    Pack years: 4.80    Types: Cigarettes    Last attempt to quit: 10/04/2016    Years since quitting: 1.4  . Smokeless tobacco: Never Used  . Tobacco comment: smoke 1 to 2 a day  Substance Use Topics  . Alcohol use: No    Alcohol/week: 0.0 standard drinks  . Drug use: No     Allergies   No known allergies   Review of Systems Review of Systems  All other systems reviewed and are negative.    Physical Exam Updated Vital Signs BP 121/69 (BP Location: Left Arm)   Pulse 93   Temp 98.3 F (36.8 C) (Oral)   Resp 14   SpO2 95%   Physical Exam  Constitutional: He is oriented to person, place, and time. He appears well-developed. No distress.  Elderly, cachectic  HENT:  Head: Normocephalic and atraumatic.  Right Ear: External ear normal.  Left Ear: External ear normal.  Eyes: Pupils are equal, round, and reactive to light. Conjunctivae and EOM are normal.  Neck: Normal range of motion and phonation normal. Neck supple.  Cardiovascular: Normal rate, regular  rhythm and normal heart sounds.  Pulmonary/Chest: Effort normal and breath sounds normal. He exhibits no bony tenderness.  Abdominal: Soft. There is no tenderness.  Genitourinary:  Genitourinary Comments: Superficial abrasion, consistent with early pressure sore, over the sacral region.  This area appears to be fractured and may have been a source of bleeding today.  Anus is normal.  Small amount of light brown stool in the rectal vault.  Stool was sent for Hemoccult testing.  No external anal or rectal lesions appreciated.  Musculoskeletal: Normal range of motion.  Globally weak, no asymmetry.  Neurological: He is alert and oriented to person, place, and time. No cranial nerve deficit or sensory deficit. He exhibits normal muscle tone. Coordination normal.  Skin: Skin is warm, dry  and intact.  Psychiatric: He has a normal mood and affect. His behavior is normal.  Nursing note and vitals reviewed.    ED Treatments / Results  Labs (all labs ordered are listed, but only abnormal results are displayed) Labs Reviewed  COMPREHENSIVE METABOLIC PANEL - Abnormal; Notable for the following components:      Result Value   BUN 34 (*)    Calcium 8.8 (*)    Total Protein 8.4 (*)    Albumin 3.3 (*)    AST 14 (*)    All other components within normal limits  CBC - Abnormal; Notable for the following components:   RBC 2.36 (*)    Hemoglobin 8.8 (*)    HCT 29.5 (*)    MCV 125.0 (*)    MCH 37.3 (*)    MCHC 29.8 (*)    RDW 25.1 (*)    All other components within normal limits  POC OCCULT BLOOD, ED  TYPE AND SCREEN    EKG None  Radiology No results found.  Procedures Procedures (including critical care time)  Medications Ordered in ED Medications - No data to display   Initial Impression / Assessment and Plan / ED Course  I have reviewed the triage vital signs and the nursing notes.  Pertinent labs & imaging results that were available during my care of the patient were reviewed by  me and considered in my medical decision making (see chart for details).  Clinical Course as of Mar 31 1725  Sat Mar 31, 2018  1547 Normal except hemoglobin low, MCV high  CBC(!) [EW]  1548 Normal except BUN high, calcium low, total protein high, albumin low, AST low  Comprehensive metabolic panel(!) [EW]  6301 Normal  POC occult blood, ED [EW]    Clinical Course User Index [EW] Daleen Bo, MD     Patient Vitals for the past 24 hrs:  BP Temp Temp src Pulse Resp SpO2  03/31/18 1707 121/69 98.3 F (36.8 C) Oral 93 14 95 %  03/31/18 1538 126/74 (!) 97.5 F (36.4 C) Oral 81 (!) 23 94 %  03/31/18 1410 102/64 (!) 97.5 F (36.4 C) Oral 80 16 93 %    5:20 PM Reevaluation with update and discussion. After initial assessment and treatment, an updated evaluation reveals he remains comfortable.  No additional complaints.  Findings discussed with patient and his family members, all questions were answered. Daleen Bo   Medical Decision Making: Malaise with rectal bleeding.  Patient is not receiving active treatment for lung cancer.  He is receiving treatment actively for dysplastic disorder, with Aranesp injections, and as needed blood transfusions.  He is apparently not seen hospice or palliative care consultation recently.  Documentation from his oncologist indicates that the plan is to "pursue supportive care only."  This was documented on 03/28/2018.  Hemoglobin today, is at baseline.  He is scheduled to have repeat hemoglobin testing done in 11 days.  He is getting every 2 week hemoglobin testing.  CRITICAL CARE-no Performed by: Daleen Bo  Nursing Notes Reviewed/ Care Coordinated Applicable Imaging Reviewed Interpretation of Laboratory Data incorporated into ED treatment  The patient appears reasonably screened and/or stabilized for discharge and I doubt any other medical condition or other [facility] requiring further screening, evaluation, or treatment in the ED at this time prior  to discharge.  Plan: Home Medications-continue routine medications; Home Treatments-wound care sacral decubitus.; return here if the recommended treatment, does not improve the symptoms; Recommended follow up-PCP, oncology, PRN.  Have sacral wound checked in a week or 2.   Final Clinical Impressions(s) / ED Diagnoses   Final diagnoses:  Pressure injury of sacral region, stage 1  Rectal bleeding    ED Discharge Orders    None       Daleen Bo, MD 03/31/18 1726

## 2018-03-31 NOTE — ED Notes (Signed)
Pt informed he can eat his food brought by wife

## 2018-03-31 NOTE — ED Triage Notes (Signed)
Pt here for large episode of rectal bleeding this am and several smaller episodes after that; pt with hx of anemia and instructed to go to ED for further eval

## 2018-03-31 NOTE — ED Triage Notes (Signed)
Per pt's wife: Pt reports pt has been having rectal bleeding and has a hx of anemia. Pt's wife reports he is weaker than normal and that the blood looks dark red and is mixed with stool.  Pt reportedly has lung cancer and is not undergoing chemotherapy.

## 2018-03-31 NOTE — Discharge Instructions (Addendum)
For the pressure area on the sacral region, which looks like a abrasion, clean it daily with soap and water.  Also apply a soft gauze bandage to help protect it.  Have him see his oncologist or primary care doctor to check on this wound in a week or 2.  If he has more bleeding, call the doctor to arrange for a hemoglobin test.  Today there was no blood in the rectum when he was checked.

## 2018-04-11 ENCOUNTER — Telehealth: Payer: Self-pay | Admitting: Hematology and Oncology

## 2018-04-11 ENCOUNTER — Inpatient Hospital Stay: Payer: Medicare Other | Attending: Hematology and Oncology

## 2018-04-11 ENCOUNTER — Inpatient Hospital Stay: Payer: Medicare Other

## 2018-04-11 ENCOUNTER — Inpatient Hospital Stay (HOSPITAL_BASED_OUTPATIENT_CLINIC_OR_DEPARTMENT_OTHER): Payer: Medicare Other | Admitting: Hematology and Oncology

## 2018-04-11 ENCOUNTER — Encounter: Payer: Self-pay | Admitting: Hematology and Oncology

## 2018-04-11 ENCOUNTER — Other Ambulatory Visit: Payer: Self-pay | Admitting: Hematology and Oncology

## 2018-04-11 VITALS — BP 108/73 | HR 79 | Temp 98.4°F | Resp 16

## 2018-04-11 DIAGNOSIS — J449 Chronic obstructive pulmonary disease, unspecified: Secondary | ICD-10-CM | POA: Diagnosis not present

## 2018-04-11 DIAGNOSIS — Z79899 Other long term (current) drug therapy: Secondary | ICD-10-CM | POA: Diagnosis not present

## 2018-04-11 DIAGNOSIS — D61818 Other pancytopenia: Secondary | ICD-10-CM

## 2018-04-11 DIAGNOSIS — Z7952 Long term (current) use of systemic steroids: Secondary | ICD-10-CM

## 2018-04-11 DIAGNOSIS — Z923 Personal history of irradiation: Secondary | ICD-10-CM | POA: Insufficient documentation

## 2018-04-11 DIAGNOSIS — Z9221 Personal history of antineoplastic chemotherapy: Secondary | ICD-10-CM | POA: Insufficient documentation

## 2018-04-11 DIAGNOSIS — D4620 Refractory anemia with excess of blasts, unspecified: Secondary | ICD-10-CM

## 2018-04-11 DIAGNOSIS — C3412 Malignant neoplasm of upper lobe, left bronchus or lung: Secondary | ICD-10-CM | POA: Diagnosis not present

## 2018-04-11 DIAGNOSIS — D469 Myelodysplastic syndrome, unspecified: Secondary | ICD-10-CM | POA: Diagnosis not present

## 2018-04-11 DIAGNOSIS — R64 Cachexia: Secondary | ICD-10-CM

## 2018-04-11 DIAGNOSIS — E860 Dehydration: Secondary | ICD-10-CM | POA: Insufficient documentation

## 2018-04-11 DIAGNOSIS — R630 Anorexia: Secondary | ICD-10-CM | POA: Diagnosis not present

## 2018-04-11 LAB — CBC WITH DIFFERENTIAL/PLATELET
Abs Immature Granulocytes: 0.02 10*3/uL (ref 0.00–0.07)
BASOS ABS: 0 10*3/uL (ref 0.0–0.1)
Basophils Relative: 1 %
EOS ABS: 0.1 10*3/uL (ref 0.0–0.5)
EOS PCT: 1 %
HCT: 25 % — ABNORMAL LOW (ref 39.0–52.0)
Hemoglobin: 7.7 g/dL — ABNORMAL LOW (ref 13.0–17.0)
Immature Granulocytes: 0 %
Lymphocytes Relative: 7 %
Lymphs Abs: 0.5 10*3/uL — ABNORMAL LOW (ref 0.7–4.0)
MCH: 38.1 pg — AB (ref 26.0–34.0)
MCHC: 30.8 g/dL (ref 30.0–36.0)
MCV: 123.8 fL — AB (ref 80.0–100.0)
MONO ABS: 0.4 10*3/uL (ref 0.1–1.0)
Monocytes Relative: 7 %
NRBC: 0 % (ref 0.0–0.2)
Neutro Abs: 5.5 10*3/uL (ref 1.7–7.7)
Neutrophils Relative %: 84 %
Platelets: 249 10*3/uL (ref 150–400)
RBC: 2.02 MIL/uL — ABNORMAL LOW (ref 4.22–5.81)
RDW: 23.6 % — AB (ref 11.5–15.5)
WBC: 6.5 10*3/uL (ref 4.0–10.5)

## 2018-04-11 LAB — SAMPLE TO BLOOD BANK

## 2018-04-11 LAB — PREPARE RBC (CROSSMATCH)

## 2018-04-11 MED ORDER — DIPHENHYDRAMINE HCL 25 MG PO CAPS
25.0000 mg | ORAL_CAPSULE | Freq: Once | ORAL | Status: AC
  Administered 2018-04-11: 25 mg via ORAL

## 2018-04-11 MED ORDER — SODIUM CHLORIDE 0.9% FLUSH
10.0000 mL | INTRAVENOUS | Status: AC | PRN
  Administered 2018-04-11: 10 mL
  Filled 2018-04-11: qty 10

## 2018-04-11 MED ORDER — ACETAMINOPHEN 325 MG PO TABS
ORAL_TABLET | ORAL | Status: AC
  Filled 2018-04-11: qty 2

## 2018-04-11 MED ORDER — ACETAMINOPHEN 325 MG PO TABS
650.0000 mg | ORAL_TABLET | Freq: Once | ORAL | Status: AC
  Administered 2018-04-11: 650 mg via ORAL

## 2018-04-11 MED ORDER — SODIUM CHLORIDE 0.9% FLUSH
10.0000 mL | Freq: Once | INTRAVENOUS | Status: AC
  Administered 2018-04-11: 10 mL
  Filled 2018-04-11: qty 10

## 2018-04-11 MED ORDER — SODIUM CHLORIDE 0.9% IV SOLUTION
250.0000 mL | Freq: Once | INTRAVENOUS | Status: AC
  Administered 2018-04-11: 250 mL via INTRAVENOUS
  Filled 2018-04-11: qty 250

## 2018-04-11 MED ORDER — DARBEPOETIN ALFA 500 MCG/ML IJ SOSY
500.0000 ug | PREFILLED_SYRINGE | Freq: Once | INTRAMUSCULAR | Status: AC
  Administered 2018-04-11: 500 ug via SUBCUTANEOUS

## 2018-04-11 MED ORDER — DIPHENHYDRAMINE HCL 25 MG PO CAPS
ORAL_CAPSULE | ORAL | Status: AC
  Filled 2018-04-11: qty 1

## 2018-04-11 MED ORDER — DARBEPOETIN ALFA 500 MCG/ML IJ SOSY
PREFILLED_SYRINGE | INTRAMUSCULAR | Status: AC
  Filled 2018-04-11: qty 1

## 2018-04-11 MED ORDER — HEPARIN SOD (PORK) LOCK FLUSH 100 UNIT/ML IV SOLN
500.0000 [IU] | Freq: Every day | INTRAVENOUS | Status: AC | PRN
  Administered 2018-04-11: 500 [IU]
  Filled 2018-04-11: qty 5

## 2018-04-11 NOTE — Assessment & Plan Note (Signed)
He has gained some weight with improved energy and appetite while on prednisone He will continue iindefinitely

## 2018-04-11 NOTE — Telephone Encounter (Signed)
Gave patient avs and calendar.   °

## 2018-04-11 NOTE — Patient Instructions (Signed)
Transfusin de plaquetas, cuidados posteriores (Platelet Transfusion, Care After) Siga estas instrucciones durante las prximas semanas. Estas indicaciones le proporcionan informacin acerca de cmo deber cuidarse despus del procedimiento. El mdico tambin podr darle instrucciones ms especficas. El tratamiento ha sido planificado segn las prcticas mdicas actuales, pero en algunos casos pueden ocurrir problemas. Comunquese con el mdico si tiene algn problema o tiene dudas despus del procedimiento. QU ESPERAR DESPUS DEL PROCEDIMIENTO Despus del procedimiento, es comn [employer] siguientes sntomas:  Hematomas y Management consultant donde se coloc la va intravenosa (IV).  Fiebre o escalofros durante las primeras 48horas despus de la transfusin. Vassar los medicamentos solamente como se lo haya indicado el mdico. Pregntele al mdico si puede tomar un analgsico de venta libre si tiene Systems analyst o Social research officer, government de cabeza uno o dos das despus de la transfusin.  Reanude sus actividades normales como se lo haya indicado el mdico. SOLICITE ATENCIN MDICA SI:  Jaclynn Guarneri.  Tiene dolores de [place].  Observa enrojecimiento, hinchazn o Management consultant donde se coloc la va intravenosa (IV).  Tiene picazn en la piel o una erupcin cutnea.  Vomita.  Se siente cansado o dbil de un modo que no es habitual. SOLICITE ATENCIN MDICA DE INMEDIATO SI:  Tiene dificultad para respirar.  [place] la cantidad que orina u orina con menos frecuencia que lo normal.  La orina es ms oscura que lo normal.  Siente dolor en la espalda, el abdomen o el pecho.  Tiene la piel fra y pegajosa.  Tiene una frecuencia cardaca acelerada. Esta informacin no tiene Marine scientist el consejo del mdico. Asegrese de hacerle al mdico cualquier pregunta que tenga. Document Released: 05/16/2014 Document Revised: 05/16/2014 Document Reviewed:  03/05/2014 Elsevier Interactive Patient Education  2018 [employer].   Darbepoetin Alfa injection What is this medicine? DARBEPOETIN ALFA (dar be POE e tin AL fa) helps your body make more red blood cells. It is used to treat anemia caused by chronic kidney failure and chemotherapy. This medicine may be used for other purposes; ask your health care provider or pharmacist if you have questions. COMMON BRAND NAME(S): Aranesp What should I tell my health care provider before I take this medicine? They need to know if you have any of these conditions: -blood clotting disorders or history of blood clots -cancer patient not on chemotherapy -cystic fibrosis -heart disease, such as angina, heart failure, or a history of a heart attack -hemoglobin level of 12 g/dL or greater -high blood pressure -low levels of folate, iron, or vitamin B12 -seizures -an unusual or allergic reaction to darbepoetin, erythropoietin, albumin, hamster proteins, latex, other medicines, foods, dyes, or preservatives -pregnant or trying to get pregnant -breast-feeding How should I use this medicine? This medicine is for injection into a vein or under the skin. It is usually given by a health care professional in a hospital or clinic setting. If you get this medicine at home, you will be taught how to prepare and give this medicine. Use exactly as directed. Take your medicine at regular intervals. Do not take your medicine more often than directed. It is important that you put your used needles and syringes in a special sharps container. Do not put them in a trash can. If you do not have a sharps container, call your pharmacist or healthcare provider to get one. A special MedGuide will be given to you by the pharmacist with each prescription and refill.  Be sure to read this information carefully each time. Talk to your pediatrician regarding the use of this medicine in children. While this medicine may be used in children  as young as 1 year for selected conditions, precautions do apply. Overdosage: If you think you have taken too much of this medicine contact a poison control center or emergency room at once. NOTE: This medicine is only for you. Do not share this medicine with others. What if I miss a dose? If you miss a dose, take it as soon as you can. If it is almost time for your next dose, take only that dose. Do not take double or extra doses. What may interact with this medicine? Do not take this medicine with any of the following medications: -epoetin alfa This list may not describe all possible interactions. Give your health care provider a list of all the medicines, herbs, non-prescription drugs, or dietary supplements you use. Also tell them if you smoke, drink alcohol, or use illegal drugs. Some items may interact with your medicine. What should I watch for while using this medicine? Your condition will be monitored carefully while you are receiving this medicine. You may need blood work done while you are taking this medicine. What side effects may I notice from receiving this medicine? Side effects that you should report to your doctor or health care professional as soon as possible: -allergic reactions like skin rash, itching or hives, swelling of the face, lips, or tongue -breathing problems -changes in vision -chest pain -confusion, trouble speaking or understanding -feeling faint or lightheaded, falls -high blood pressure -muscle aches or pains -pain, swelling, warmth in the leg -rapid weight gain -severe headaches -sudden numbness or weakness of the face, arm or leg -trouble walking, dizziness, loss of balance or coordination -seizures (convulsions) -swelling of the ankles, feet, hands -unusually weak or tired Side effects that usually do not require medical attention (report to your doctor or health care professional if they continue or are bothersome): -diarrhea -fever, chills  (flu-like symptoms) -headaches -nausea, vomiting -redness, stinging, or swelling at site where injected This list may not describe all possible side effects. Call your doctor for medical advice about side effects. You may report side effects to FDA at 1-800-FDA-1088. Where should I keep my medicine? Keep out of the reach of children. Store in a refrigerator between 2 and 8 degrees C (36 and 46 degrees F). Do not freeze. Do not shake. Throw away any unused portion if using a single-dose vial. Throw away any unused medicine after the expiration date. NOTE: This sheet is a summary. It may not cover all possible information. If you have questions about this medicine, talk to your doctor, pharmacist, or health care provider.  2018 Elsevier/Gold Standard (2015-12-14 19:52:26)

## 2018-04-11 NOTE — Assessment & Plan Note (Signed)
Previously, I explained to the patient and family members again the rationale of not pursuing further palliative chemotherapy due to his frail status and refractory to multiple treatment I recommend we focus on supportive care only They agree with the plan of care

## 2018-04-11 NOTE — Assessment & Plan Note (Signed)
He appears to be responding well to low-dose prednisone therapy along with transfusion support and darbepoetin injection I recommend slow prednisone taper of 10 mg alternate with 20 mg every other day We discussed some of the risks, benefits, and alternatives of blood transfusions. The patient is symptomatic from anemia and the hemoglobin level is critically low.  Some of the side-effects to be expected including risks of transfusion reactions, chills, infection, syndrome of volume overload and risk of hospitalization from various reasons and the patient is willing to proceed and went ahead to sign consent today. He will receive a unit of blood whenever hemoglobin is less than 8.

## 2018-04-11 NOTE — Progress Notes (Signed)
[place] OFFICE PROGRESS NOTE  Patient Care Team: [name], MD as PCP - General (Family Medicine) [name], MD as Consulting Physician (Hematology and Oncology)  ASSESSMENT & PLAN:  Cancer of upper lobe of left lung [facility]) Previously, I explained to the patient and family members again the rationale of not pursuing further palliative chemotherapy due to his frail status and refractory to multiple treatment I recommend we focus on supportive care only They agree with the plan of care  MDS (myelodysplastic syndrome), low grade ([place]) He appears to be responding well to low-dose prednisone therapy along with transfusion support and darbepoetin injection I recommend slow prednisone taper of 10 mg alternate with 20 mg every other day We discussed some of the risks, benefits, and alternatives of blood transfusions. The patient is symptomatic from anemia and the hemoglobin level is critically low.  Some of the side-effects to be expected including risks of transfusion reactions, chills, infection, syndrome of volume overload and risk of hospitalization from various reasons and the patient is willing to proceed and went ahead to sign consent today. He will receive a unit of blood whenever hemoglobin is less than 8.  Malignant cachexia ([place]) He has gained some weight with improved energy and appetite while on prednisone He will continue iindefinitely   Orders Placed This Encounter  Procedures  . Prepare RBC    Standing Status:   Standing    Number of Occurrences:   1    Order Specific Question:   # of Units    Answer:   1 unit    Order Specific Question:   Transfusion Indications    Answer:   Symptomatic Anemia    Order Specific Question:   Special Requirements    Answer:   Irradiated    Order Specific Question:   If emergent release call blood bank    Answer:   Not emergent release  . Type and screen    Standing Status:   Future    Standing Expiration Date:    04/12/2019  . Type and screen    INTERVAL HISTORY: Please see below for problem oriented charting. He returns for further follow-up He was recently hospitalized due to fall but has recovered Denies recent infection, fever or chills He has chronic cough, stable No recent hemoptysis He denies recent smoking No recent seizure disorder His weight is stable  SUMMARY OF ONCOLOGIC HISTORY: Oncology History   MDS, R-IPSS score of 3, low risk (Hg 8.9, +16 chromosome on BM, 2% blast count)   Squamous cell carcinoma of the epiglottis   Primary site: Larynx - Supraglottis (Left)   Staging method: AJCC 7th Edition   Clinical: Stage I (T1, N0, M0) signed by [name], MD on 04/30/2013 12:49 PM   Pathologic: Stage I (T1, N0, cM0) signed by [name], MD on 04/30/2013 12:49 PM   Summary: Stage I (T1, N0, cM0)       MDS (myelodysplastic syndrome), low grade ([place])   02/01/2007 Bone Marrow Biopsy    BM biopsy was abnormal, overall probable low grade MDS    03/23/2011 - 02/06/2013 Chemotherapy    [name], discontinued due to diagnosis of laryngeal ca    09/06/2013 Bone Marrow Biopsy    Repeat bone marrow aspirate and biopsy confirmed low-grade myelodysplastic syndrome.    09/17/2013 -  Chemotherapy    The patient resumed darbepoetin injections to treat the anemia.     History of head and neck cancer  03/06/2013 Procedure    Biopsy from epiglottic region showed invasive [facility]    04/25/2013 Imaging    Ct scan showed no other involvement in the LN    05/13/2013 - 06/28/2013 Radiation Therapy    He received radiation therapy, 70 gray in 35 fractions to the larynx    06/09/2016 Imaging    CT scan of the chest showed Bilateral spiculated soft tissue pulmonary masses, highly suspicious for multifocal malignancy. Borderline enlarged left-sided mediastinal lymphadenopathy. Background of severe emphysematous changes, chronic cylindrical bronchiectasis and hyperinflation of the lungs.  Findings consistent with longstanding COPD. Diffusely thickened interstitial markings. This may represent chronic interstitial lung changes, however lymphangitic spread of malignancy cannot be excluded. Anterior compression deformities of several of the thoracic vertebral bodies, with heterogeneous appearance of T7 and T9 vertebral body. This may represent degenerative changes, however osseous metastatic disease cannot be excluded.     07/11/2016 PET scan    There are 2 nodules in the left upper lobe and 2 nodules within the right lower lobe which have spiculated margin and exhibit intense radiotracer uptake compatible with multifocal pulmonary metastasis versus multiple synchronous primary pulmonary neoplasms. 2. Emphysema 3. Aortic atherosclerosis and multi vessel coronary artery calcification.     Cancer of upper lobe of left lung ([place])   06/09/2016 Imaging    CT chest: Bilateral spiculated soft tissue pulmonary masses, highly suspicious for multifocal malignancy. Borderline enlarged left-sided mediastinal lymphadenopathy. Background of severe emphysematous changes, chronic cylindrical bronchiectasis and hyperinflation of the lungs. Findings consistent with longstanding COPD. Diffusely thickened interstitial markings. This may represent chronic interstitial lung changes, however lymphangitic spread of malignancy cannot be excluded. Anterior compression deformities of several of the thoracic vertebral bodies, with heterogeneous appearance of T7 and T9 vertebral body. This may represent degenerative changes, however osseous metastatic disease cannot be excluded.     06/29/2016 Imaging    MRI brain: No evidence of metastatic disease or recent infarction. Advanced generalized brain atrophy with moderate to marked chronic small-vessel ischemic changes throughout. FLAIR imaging appears to show scattered gyri showing FLAIR hyperintensity. These abnormalities are not confirmed with restricted diffusion or  contrast enhancement. Therefore, we are not certain that this is not artifactual. The differential diagnosis does include true pathology such as posterior reversible encephalopathy, viral or prion disease, postictal change in post chemotherapy change. If there is concern about active CNS pathology, re- scanning in 4-6 weeks could be useful.    07/27/2016 Imaging    CT chest: Bilateral pulmonary lesions. These are suspicious for metastatic disease. Lesions have minimally changed but there may be slight enlargement of the cavitary lesion in the right lower lobe. Severe emphysematous changes.     08/10/2016 Pathology Results    1. Lung, biopsy, RUL, (apical segment) - SCANT BENIGN LUNG PARENCHYMA. - THERE IS NO EVIDENCE OF MALIGNANCY. 2. Lung, biopsy, LUL lingula - SQUAMOUS CELL CARCINOMA. - SEE COMMENT. 3. Lung, biopsy, LUL - ADENOCARCINOMA. - SEE COMMENT. Microscopic Comment 2. The malignant cells are positive for cytokeratin 56 and p63. They are negative for TTF-1. The findings are consistent with squamous cell carcinoma. There is likely insufficient tissue remaining for additional studies, if requested. 3. The malignant cells are positive for TTF-1 and negative for p63 and cytokeratin 5/6. The findings are consistent with adenocarcinoma.    08/10/2016 Procedure    The targets were defined as follows: Left upper lobe nodule, target 1 Lingular nodule, target 2 Proximal right lower lobe nodule, target 3 More distal right lower lobe,  nodule  The extendable working channel was secured into place and the locator guide was withdrawn. Under fluoroscopic guidance transbronchial needle brushings, transbronchial Wang needle biopsies, and transbronchial forceps biopsies were performed in the LUL (target 1) and the lingula (target 2) to be sent for cytology and pathology. A bronchioalveolar lavage was performed in the lingula in the vicinity of target 2  and sent for cytology and microbiology  (bacterial, fungal, AFB smears and cultures).  Fiducial markers were then placed around target 1, target 2, and in the right lower lobe in the vicinity of both target 3 and target 4 to be used for possible radiation therapy should this be indicated. At the end of the procedure a general airway inspection was performed and there was no evidence of active bleeding. The bronchoscope was removed.  The patient tolerated the procedure well. There was no significant blood loss and there were no obvious complications. A post-procedural chest x-ray is pending.  Samples: 1. Transbronchial needle brushings from targets 1 and 2 2. Transbronchial Wang needle biopsies from targets 1 and 2 3. Transbronchial forceps biopsies from targets 1 and 2 4. Bronchoalveolar lavage from lingula, target 2 5. Endobronchial biopsies from RUL apical segment 6. Endobronchial brushings from right upper lobe apical segment     08/10/2016 Genetic Testing    Patient has genetic testing done for [employer] and PD-L1 testing Results revealed PD-L1 testing is negative 0%    08/31/2016 Procedure    Technically successful right IJ power-injectable port catheter placement. Ready for routine use    09/21/2016 - 06/08/2017 Chemotherapy    The patient had chemotherapy with carboplatin and Taxol.    11/30/2016 PET scan    1. Interval resection of the hypermetabolic lingular nodule. 2. The 3 other spiculated, hypermetabolic nodules within the left upper and right lower lobes are slightly smaller with decreased metabolic activity, consistent with some response to interval therapy. 3. No progressive disease    03/06/2017 PET scan    1. Mixed appearance, with significant improvement in the left upper lobe nodule ; stable appearance of the more cephalad right lower lobe nodule; but enlargement and increased in metabolic activity of the more inferior right lower lobe nodule which appears cavitary. No new nodule. 2. Previously there was  some moderate increase in diffuse marrow activity. That has increased further, and could be a response from prior granulocyte/marrow stimulation, although I cannot completely exclude the possibility that this is related to the patient's myelodysplastic syndrome. No CT correlate. 3. Other imaging findings of potential clinical significance: Aortic Atherosclerosis (ICD10-I70.0) and Emphysema (ICD10-J43.9). Coronary atherosclerosis. Low-density blood pool suggests anemia. Chronic right upper lobe scarring. Mucus in both mainstem bronchi. Suspected left renal cysts. Indistinct but likely enlarged prostate gland.    04/06/2017 - 04/18/2017 Radiation Therapy    He received SBRT treatment Radiation treatment dates:   04/06/17 - 04/18/17  Site/dose:   Right SBRT Lung// 50 Gy in 5 fx  Beams/energy:   Photon // SRBT/ SBT-3D     06/26/2017 PET scan    1. Clear interval response to therapy of the 2 right lower lobe pulmonary nodules, each decreased in both size and hypermetabolism. 2. Stable size and hypermetabolic activity in the left upper lobe nodule. 3. Diffusely decreased FDG accumulation within the marrow space. 4. Symmetric uptake in both parotid glands is likely physiologic. Given the symmetry, this is most likely physiologic or inflammatory. 5. Prostatomegaly with marked bladder distention. Component of bladder outlet obstruction suspected. 6. Emphysema. (  ICD10-J43.9) 7. Aortic Atherosclerois (ICD10-170.0)    09/25/2017 PET scan    1. New and enlarging small hypermetabolic nodules in the lingula are compatible with metastatic disease. These are in proximity to the linear scarring fiducial markers. 2. New hypermetabolic subcarinal nodal metastasis. 3. Left upper lobe index nodule is stable to slightly progressed in the interval in shows mild increase in hypermetabolism. 4. No substantial change in the 2 index right lower lobe nodules showing only minimal FDG uptake, as before. 5. Interval  progression of patchy airspace opacity in both dependent lower lungs, compatible with infection/inflammation.    10/12/2017 - 12/13/2017 Chemotherapy    The patient had atezolizumab for chemotherapy treatment.     01/02/2018 PET scan    2.8 cm left upper lobe nodule, increased, corresponding to known primary bronchogenic neoplasm.  Two lingular nodules measuring up to 11 mm, mildly increased, suspicious for metastases.  12 mm short axis subcarinal node, worrisome for nodal metastasis, grossly unchanged.  Patchy right lower lobe opacity, suspicious for pneumonia. Mild left basilar opacity, improved.     REVIEW OF SYSTEMS:   Constitutional: Denies fevers, chills or abnormal weight loss Eyes: Denies blurriness of vision Ears, nose, mouth, throat, and face: Denies mucositis or sore throat Cardiovascular: Denies palpitation, chest discomfort or lower extremity swelling Gastrointestinal:  Denies nausea, heartburn or change in bowel habits Skin: Denies abnormal skin rashes Lymphatics: Denies new lymphadenopathy or easy bruising Neurological:Denies numbness, tingling or new weaknesses Behavioral/Psych: Mood is stable, no new changes  All other systems were reviewed with the patient and are negative.  I have reviewed the past medical history, past surgical history, social history and family history with the patient and they are unchanged from previous note.  ALLERGIES:  is allergic to no known allergies.  MEDICATIONS:  Current Outpatient Medications  Medication Sig Dispense Refill  . albuterol (PROVENTIL HFA;VENTOLIN HFA) 108 (90 Base) MCG/ACT inhaler Inhale 2 puffs into the lungs every 4 (four) hours as needed for wheezing or shortness of breath. 1 Inhaler 5  . ferrous sulfate 325 (65 FE) MG tablet Take 325 mg by mouth daily with breakfast.    . folic acid (FOLVITE) 1 MG tablet Take 1 mg by mouth daily.    . Glycopyrrolate-Formoterol (BEVESPI AEROSPHERE) 9-4.8 MCG/ACT AERO Inhale 2 puffs  into the lungs 2 (two) times daily. 2 Inhaler 0  . levETIRAcetam (KEPPRA) 500 MG tablet Take 1 tablet (500 mg total) by mouth 2 (two) times daily. 180 tablet 3  . mirtazapine (REMERON) 30 MG tablet Take 1 tablet (30 mg total) by mouth at bedtime. 30 tablet 11  . predniSONE (DELTASONE) 20 MG tablet Take 1 tablet (20 mg total) by mouth daily with breakfast. (Patient taking differently: Take 10 mg by mouth daily with breakfast. ) 30 tablet 11   No current facility-administered medications for this visit.     PHYSICAL EXAMINATION: ECOG PERFORMANCE STATUS: 2 - Symptomatic, <50% confined to bed  Vitals:   04/11/18 1132  BP: (!) 89/59  Pulse: 88  Resp: 18  Temp: (!) 97.5 F (36.4 C)  SpO2: 95%   Filed Weights   04/11/18 1132  Weight: 98 lb 3.2 oz (44.5 kg)    GENERAL:alert, no distress and comfortable.  He looks thin and cachectic SKIN: skin color, texture, turgor are normal, no rashes or significant lesions EYES: normal, Conjunctiva are pink and non-injected, sclera clear OROPHARYNX:no exudate, no erythema and lips, buccal mucosa, and tongue normal  NECK: supple, thyroid normal size, non-tender,  without nodularity LYMPH:  no palpable lymphadenopathy in the cervical, axillary or inguinal LUNGS: clear to auscultation and percussion with normal breathing effort HEART: regular rate & rhythm and no murmurs and no lower extremity edema ABDOMEN:abdomen soft, non-tender and normal bowel sounds Musculoskeletal:no cyanosis of digits and digital clubbing is present NEURO: alert & oriented x 3 with fluent speech, no focal motor/sensory deficits  LABORATORY DATA:  I have reviewed the data as listed    Component Value Date/Time   NA 141 03/31/2018 1426   NA 142 05/03/2017 1114   K 4.4 03/31/2018 1426   K 4.5 05/03/2017 1114   CL 109 03/31/2018 1426   CL 107 09/19/2012 1325   CO2 23 03/31/2018 1426   CO2 21 (L) 05/03/2017 1114   GLUCOSE 94 03/31/2018 1426   GLUCOSE 88 05/03/2017 1114    GLUCOSE 97 09/19/2012 1325   BUN 34 (H) 03/31/2018 1426   BUN 18.5 05/03/2017 1114   CREATININE 0.90 03/31/2018 1426   CREATININE 0.78 01/31/2018 1046   CREATININE 0.9 05/03/2017 1114   CALCIUM 8.8 (L) 03/31/2018 1426   CALCIUM 8.9 05/03/2017 1114   PROT 8.4 (H) 03/31/2018 1426   PROT 8.6 (H) 05/03/2017 1114   ALBUMIN 3.3 (L) 03/31/2018 1426   ALBUMIN 3.2 (L) 05/03/2017 1114   AST 14 (L) 03/31/2018 1426   AST 11 (L) 01/31/2018 1046   AST 9 05/03/2017 1114   ALT 9 03/31/2018 1426   ALT 7 01/31/2018 1046   ALT <6 05/03/2017 1114   ALKPHOS 75 03/31/2018 1426   ALKPHOS 126 05/03/2017 1114   BILITOT 0.4 03/31/2018 1426   BILITOT 0.4 01/31/2018 1046   BILITOT 0.34 05/03/2017 1114   GFRNONAA >60 03/31/2018 1426   GFRNONAA >60 01/31/2018 1046   GFRAA >60 03/31/2018 1426   GFRAA >60 01/31/2018 1046    No results found for: SPEP, UPEP  Lab Results  Component Value Date   WBC 6.5 04/11/2018   NEUTROABS 5.5 04/11/2018   HGB 7.7 (L) 04/11/2018   HCT 25.0 (L) 04/11/2018   MCV 123.8 (H) 04/11/2018   PLT 249 04/11/2018      Chemistry      Component Value Date/Time   NA 141 03/31/2018 1426   NA 142 05/03/2017 1114   K 4.4 03/31/2018 1426   K 4.5 05/03/2017 1114   CL 109 03/31/2018 1426   CL 107 09/19/2012 1325   CO2 23 03/31/2018 1426   CO2 21 (L) 05/03/2017 1114   BUN 34 (H) 03/31/2018 1426   BUN 18.5 05/03/2017 1114   CREATININE 0.90 03/31/2018 1426   CREATININE 0.78 01/31/2018 1046   CREATININE 0.9 05/03/2017 1114      Component Value Date/Time   CALCIUM 8.8 (L) 03/31/2018 1426   CALCIUM 8.9 05/03/2017 1114   ALKPHOS 75 03/31/2018 1426   ALKPHOS 126 05/03/2017 1114   AST 14 (L) 03/31/2018 1426   AST 11 (L) 01/31/2018 1046   AST 9 05/03/2017 1114   ALT 9 03/31/2018 1426   ALT 7 01/31/2018 1046   ALT <6 05/03/2017 1114   BILITOT 0.4 03/31/2018 1426   BILITOT 0.4 01/31/2018 1046   BILITOT 0.34 05/03/2017 1114     All questions were answered. The patient knows  to call the clinic with any problems, questions or concerns. No barriers to learning was detected.  I spent 15 minutes counseling the patient face to face. The total time spent in the appointment was 20 minutes and more than 50% was  on counseling and review of test results  [name], MD 04/11/2018 12:17 PM

## 2018-04-12 LAB — TYPE AND SCREEN
ABO/RH(D): O POS
ANTIBODY SCREEN: NEGATIVE
Donor AG Type: NEGATIVE
UNIT DIVISION: 0

## 2018-04-12 LAB — BPAM RBC
BLOOD PRODUCT EXPIRATION DATE: 201912292359
ISSUE DATE / TIME: 201912041342
Unit Type and Rh: 5100

## 2018-04-25 ENCOUNTER — Inpatient Hospital Stay: Payer: Medicare Other | Admitting: Nutrition

## 2018-04-25 ENCOUNTER — Inpatient Hospital Stay: Payer: Medicare Other

## 2018-04-25 ENCOUNTER — Ambulatory Visit: Payer: Self-pay | Admitting: Hematology and Oncology

## 2018-04-25 VITALS — BP 93/60 | HR 81 | Temp 97.7°F | Resp 18

## 2018-04-25 DIAGNOSIS — D4620 Refractory anemia with excess of blasts, unspecified: Secondary | ICD-10-CM

## 2018-04-25 DIAGNOSIS — C3412 Malignant neoplasm of upper lobe, left bronchus or lung: Secondary | ICD-10-CM

## 2018-04-25 DIAGNOSIS — D61818 Other pancytopenia: Secondary | ICD-10-CM

## 2018-04-25 LAB — CBC WITH DIFFERENTIAL/PLATELET
Abs Immature Granulocytes: 0.04 10*3/uL (ref 0.00–0.07)
Basophils Absolute: 0 10*3/uL (ref 0.0–0.1)
Basophils Relative: 0 %
EOS PCT: 1 %
Eosinophils Absolute: 0.1 10*3/uL (ref 0.0–0.5)
HCT: 28.8 % — ABNORMAL LOW (ref 39.0–52.0)
Hemoglobin: 8.8 g/dL — ABNORMAL LOW (ref 13.0–17.0)
Immature Granulocytes: 1 %
Lymphocytes Relative: 6 %
Lymphs Abs: 0.5 10*3/uL — ABNORMAL LOW (ref 0.7–4.0)
MCH: 37 pg — AB (ref 26.0–34.0)
MCHC: 30.6 g/dL (ref 30.0–36.0)
MCV: 121 fL — ABNORMAL HIGH (ref 80.0–100.0)
MONOS PCT: 4 %
Monocytes Absolute: 0.4 10*3/uL (ref 0.1–1.0)
Neutro Abs: 7.3 10*3/uL (ref 1.7–7.7)
Neutrophils Relative %: 88 %
Platelets: 243 10*3/uL (ref 150–400)
RBC: 2.38 MIL/uL — ABNORMAL LOW (ref 4.22–5.81)
WBC: 8.2 10*3/uL (ref 4.0–10.5)
nRBC: 0 % (ref 0.0–0.2)

## 2018-04-25 LAB — SAMPLE TO BLOOD BANK

## 2018-04-25 MED ORDER — DARBEPOETIN ALFA 500 MCG/ML IJ SOSY
PREFILLED_SYRINGE | INTRAMUSCULAR | Status: AC
  Filled 2018-04-25: qty 1

## 2018-04-25 MED ORDER — SODIUM CHLORIDE 0.9% FLUSH
10.0000 mL | Freq: Once | INTRAVENOUS | Status: AC
  Administered 2018-04-25: 10 mL
  Filled 2018-04-25: qty 10

## 2018-04-25 MED ORDER — DARBEPOETIN ALFA 500 MCG/ML IJ SOSY
500.0000 ug | PREFILLED_SYRINGE | Freq: Once | INTRAMUSCULAR | Status: AC
  Administered 2018-04-25: 500 ug via SUBCUTANEOUS

## 2018-04-25 MED ORDER — HEPARIN SOD (PORK) LOCK FLUSH 100 UNIT/ML IV SOLN
500.0000 [IU] | Freq: Once | INTRAVENOUS | Status: AC
  Administered 2018-04-25: 500 [IU]
  Filled 2018-04-25: qty 5

## 2018-04-25 NOTE — Patient Instructions (Signed)
Implanted Port Home Guide An implanted port is a device that is placed under the skin. It is usually placed in the chest. The device can be used to give IV medicine, to take blood, or for dialysis. You may have an implanted port if:  You need IV medicine that would be irritating to the small veins in your hands or arms.  You need IV medicines, such as antibiotics, for a long period of time.  You need IV nutrition for a long period of time.  You need dialysis. Having a port means that your health care provider will not need to use the veins in your arms for these procedures. You may have fewer limitations when using a port than you would if you used other types of long-term IVs, and you will likely be able to return to normal activities after your incision heals. An implanted port has two main parts:  Reservoir. The reservoir is the part where a needle is inserted to give medicines or draw blood. The reservoir is round. After it is placed, it appears as a small, raised area under your skin.  Catheter. The catheter is a thin, flexible tube that connects the reservoir to a vein. Medicine that is inserted into the reservoir goes into the catheter and then into the vein. How is my port accessed? To access your port:  A numbing cream may be placed on the skin over the port site.  Your health care provider will put on a mask and sterile gloves.  The skin over your port will be cleaned carefully with a germ-killing soap and allowed to dry.  Your health care provider will gently pinch the port and insert a needle into it.  Your health care provider will check for a blood return to make sure the port is in the vein and is not clogged.  If your port needs to remain accessed to get medicine continuously (constant infusion), your health care provider will place a clear bandage (dressing) over the needle site. The dressing and needle will need to be changed every week, or as told by your health care  provider. What is flushing? Flushing helps keep the port from getting clogged. Follow instructions from your health care provider about how and when to flush the port. Ports are usually flushed with saline solution or a medicine called heparin. The need for flushing will depend on how the port is used:  If the port is only used from time to time to give medicines or draw blood, the port may need to be flushed: ? Before and after medicines have been given. ? Before and after blood has been drawn. ? As part of routine maintenance. Flushing may be recommended every 4-6 weeks.  If a constant infusion is running, the port may not need to be flushed.  Throw away any syringes in a disposal container that is meant for sharp items (sharps container). You can buy a sharps container from a pharmacy, or you can make one by using an empty hard plastic bottle with a cover. How long will my port stay implanted? The port can stay in for as long as your health care provider thinks it is needed. When it is time for the port to come out, a surgery will be done to remove it. The surgery will be similar to the procedure that was done to put the port in. Follow these instructions at home:   Flush your port as told by your health care provider.    If you need an infusion over several days, follow instructions from your health care provider about how to take care of your port site. Make sure you: ? Wash your hands with soap and water before you change your dressing. If soap and water are not available, use alcohol-based hand sanitizer. ? Change your dressing as told by your health care provider. ? Place any used dressings or infusion bags into a plastic bag. Throw that bag in the trash. ? Keep the dressing that covers the needle clean and dry. Do not get it wet. ? Do not use scissors or sharp objects near the tube. ? Keep the tube clamped, unless it is being used.  Check your port site every day for signs of  infection. Check for: ? Redness, swelling, or pain. ? Fluid or blood. ? Pus or a bad smell.  Protect the skin around the port site. ? Avoid wearing bra straps that rub or irritate the site. ? Protect the skin around your port from seat belts. Place a soft pad over your chest if needed.  Bathe or shower as told by your health care provider. The site may get wet as long as you are not actively receiving an infusion.  Return to your normal activities as told by your health care provider. Ask your health care provider what activities are safe for you.  Carry a medical alert card or wear a medical alert bracelet at all times. This will let health care providers know that you have an implanted port in case of an emergency. Get help right away if:  You have redness, swelling, or pain at the port site.  You have fluid or blood coming from your port site.  You have pus or a bad smell coming from the port site.  You have a fever. Summary  Implanted ports are usually placed in the chest for long-term IV access.  Follow instructions from your health care provider about flushing the port and changing bandages (dressings).  Take care of the area around your port by avoiding clothing that puts pressure on the area, and by watching for signs of infection.  Protect the skin around your port from seat belts. Place a soft pad over your chest if needed.  Get help right away if you have a fever or you have redness, swelling, pain, drainage, or a bad smell at the port site. This information is not intended to replace advice given to you by your health care provider. Make sure you discuss any questions you have with your health care provider. Document Released: 04/25/2005 Document Revised: 05/28/2016 Document Reviewed: 05/28/2016 Elsevier Interactive Patient Education  2019 [place].   Darbepoetin Alfa injection What is this medicine? DARBEPOETIN ALFA (dar be POE e tin AL fa) helps your body  make more red blood cells. It is used to treat anemia caused by chronic kidney failure and chemotherapy. This medicine may be used for other purposes; ask your health care provider or pharmacist if you have questions. COMMON BRAND NAME(S): Aranesp What should I tell my health care provider before I take this medicine? They need to know if you have any of these conditions: -blood clotting disorders or history of blood clots -cancer patient not on chemotherapy -cystic fibrosis -heart disease, such as angina, heart failure, or a history of a heart attack -hemoglobin level of 12 g/dL or greater -high blood pressure -low levels of folate, iron, or vitamin B12 -seizures -an unusual or allergic reaction to darbepoetin, erythropoietin,  albumin, hamster proteins, latex, other medicines, foods, dyes, or preservatives -pregnant or trying to get pregnant -breast-feeding How should I use this medicine? This medicine is for injection into a vein or under the skin. It is usually given by a health care professional in a hospital or clinic setting. If you get this medicine at home, you will be taught how to prepare and give this medicine. Use exactly as directed. Take your medicine at regular intervals. Do not take your medicine more often than directed. It is important that you put your used needles and syringes in a special sharps container. Do not put them in a trash can. If you do not have a sharps container, call your pharmacist or healthcare provider to get one. A special MedGuide will be given to you by the pharmacist with each prescription and refill. Be sure to read this information carefully each time. Talk to your pediatrician regarding the use of this medicine in children. While this medicine may be used in children as young as 73 month of age for selected conditions, precautions do apply. Overdosage: If you think you have taken too much of this medicine contact a poison control center or emergency  room at once. NOTE: This medicine is only for you. Do not share this medicine with others. What if I miss a dose? If you miss a dose, take it as soon as you can. If it is almost time for your next dose, take only that dose. Do not take double or extra doses. What may interact with this medicine? Do not take this medicine with any of the following medications: -epoetin alfa This list may not describe all possible interactions. Give your health care provider a list of all the medicines, herbs, non-prescription drugs, or dietary supplements you use. Also tell them if you smoke, drink alcohol, or use illegal drugs. Some items may interact with your medicine. What should I watch for while using this medicine? Your condition will be monitored carefully while you are receiving this medicine. You may need blood work done while you are taking this medicine. This medicine may cause a decrease in vitamin B6. You should make sure that you get enough vitamin B6 while you are taking this medicine. Discuss the foods you eat and the vitamins you take with your health care professional. What side effects may I notice from receiving this medicine? Side effects that you should report to your doctor or health care professional as soon as possible: -allergic reactions like skin rash, itching or hives, swelling of the face, lips, or tongue -breathing problems -changes in vision -chest pain -confusion, trouble speaking or understanding -feeling faint or lightheaded, falls -high blood pressure -muscle aches or pains -pain, swelling, warmth in the leg -rapid weight gain -severe headaches -sudden numbness or weakness of the face, arm or leg -trouble walking, dizziness, loss of balance or coordination -seizures (convulsions) -swelling of the ankles, feet, hands -unusually weak or tired Side effects that usually do not require medical attention (report to your doctor or health care professional if they continue or  are bothersome): -diarrhea -fever, chills (flu-like symptoms) -headaches -nausea, vomiting -redness, stinging, or swelling at site where injected This list may not describe all possible side effects. Call your doctor for medical advice about side effects. You may report side effects to FDA at 1-800-FDA-1088. Where should I keep my medicine? Keep out of the reach of children. Store in a refrigerator between 2 and 8 degrees C (36 and 46 degrees  F). Do not freeze. Do not shake. Throw away any unused portion if using a single-dose vial. Throw away any unused medicine after the expiration date. NOTE: This sheet is a summary. It may not cover all possible information. If you have questions about this medicine, talk to your doctor, pharmacist, or health care provider.  2019 Elsevier/Gold Standard (2017-05-10 16:44:20)

## 2018-04-25 NOTE — Progress Notes (Signed)
Brief nutrition follow up completed with patient diagnosed with metastatic lung cancer. Patient is no longer receiving palliative chemotherapy due to his frail status. He is receiving supportive care. Weight decreased to 98.2 pounds 12/4 down from 103.2 pounds on October 9. He reports drinking 1 boost daily. Patient remains underweight and cachectic.  Nutrition Diagnosis: Underweight continues  Intervention: Provided support and encouragement for patient to continue increased oral intake. Encouraged him to increase oral nutrition supplements as tolerated. Provided coupons. Questions answered.  Monitoring, Evaluation, Goals: Patient will tolerate adequate calories and protein for QOL.  No follow up scheduled.

## 2018-04-30 DIAGNOSIS — C3490 Malignant neoplasm of unspecified part of unspecified bronchus or lung: Secondary | ICD-10-CM | POA: Diagnosis not present

## 2018-04-30 DIAGNOSIS — D471 Chronic myeloproliferative disease: Secondary | ICD-10-CM | POA: Diagnosis not present

## 2018-04-30 DIAGNOSIS — R64 Cachexia: Secondary | ICD-10-CM | POA: Diagnosis not present

## 2018-04-30 DIAGNOSIS — R1900 Intra-abdominal and pelvic swelling, mass and lump, unspecified site: Secondary | ICD-10-CM | POA: Diagnosis not present

## 2018-05-04 ENCOUNTER — Inpatient Hospital Stay (HOSPITAL_BASED_OUTPATIENT_CLINIC_OR_DEPARTMENT_OTHER): Payer: Medicare Other | Admitting: Medical

## 2018-05-04 ENCOUNTER — Telehealth: Payer: Self-pay | Admitting: *Deleted

## 2018-05-04 ENCOUNTER — Inpatient Hospital Stay: Payer: Medicare Other

## 2018-05-04 VITALS — BP 103/69 | HR 88 | Temp 97.4°F | Resp 18 | Ht 72.0 in | Wt 95.8 lb

## 2018-05-04 DIAGNOSIS — Z923 Personal history of irradiation: Secondary | ICD-10-CM

## 2018-05-04 DIAGNOSIS — D4620 Refractory anemia with excess of blasts, unspecified: Secondary | ICD-10-CM

## 2018-05-04 DIAGNOSIS — D469 Myelodysplastic syndrome, unspecified: Secondary | ICD-10-CM

## 2018-05-04 DIAGNOSIS — Z9221 Personal history of antineoplastic chemotherapy: Secondary | ICD-10-CM

## 2018-05-04 DIAGNOSIS — J449 Chronic obstructive pulmonary disease, unspecified: Secondary | ICD-10-CM

## 2018-05-04 DIAGNOSIS — Z7952 Long term (current) use of systemic steroids: Secondary | ICD-10-CM

## 2018-05-04 DIAGNOSIS — R64 Cachexia: Secondary | ICD-10-CM

## 2018-05-04 DIAGNOSIS — Z79899 Other long term (current) drug therapy: Secondary | ICD-10-CM

## 2018-05-04 DIAGNOSIS — C3412 Malignant neoplasm of upper lobe, left bronchus or lung: Secondary | ICD-10-CM

## 2018-05-04 DIAGNOSIS — D61818 Other pancytopenia: Secondary | ICD-10-CM

## 2018-05-04 DIAGNOSIS — R630 Anorexia: Secondary | ICD-10-CM

## 2018-05-04 DIAGNOSIS — E860 Dehydration: Secondary | ICD-10-CM

## 2018-05-04 LAB — SAMPLE TO BLOOD BANK

## 2018-05-04 LAB — CMP (CANCER CENTER ONLY)
ALK PHOS: 84 U/L (ref 38–126)
ALT: 13 U/L (ref 0–44)
AST: 15 U/L (ref 15–41)
Albumin: 2.7 g/dL — ABNORMAL LOW (ref 3.5–5.0)
Anion gap: 7 (ref 5–15)
BUN: 49 mg/dL — ABNORMAL HIGH (ref 8–23)
CALCIUM: 8.8 mg/dL — AB (ref 8.9–10.3)
CO2: 24 mmol/L (ref 22–32)
Chloride: 114 mmol/L — ABNORMAL HIGH (ref 98–111)
Creatinine: 1.06 mg/dL (ref 0.61–1.24)
GFR, Est AFR Am: >60 mL/min (ref >60)
GFR, Estimated: >60 mL/min (ref >60)
Glucose, Bld: 75 mg/dL (ref 70–99)
Potassium: 5.1 mmol/L (ref 3.5–5.1)
Sodium: 145 mmol/L (ref 135–145)
Total Bilirubin: 0.3 mg/dL (ref 0.3–1.2)
Total Protein: 8.2 g/dL — ABNORMAL HIGH (ref 6.5–8.1)

## 2018-05-04 LAB — CBC WITH DIFFERENTIAL (CANCER CENTER ONLY)
Abs Immature Granulocytes: 0.05 10*3/uL (ref 0.00–0.07)
BASOS PCT: 0 %
Basophils Absolute: 0 10*3/uL (ref 0.0–0.1)
Eosinophils Absolute: 0 10*3/uL (ref 0.0–0.5)
Eosinophils Relative: 0 %
HCT: 30.7 % — ABNORMAL LOW (ref 39.0–52.0)
Hemoglobin: 9.1 g/dL — ABNORMAL LOW (ref 13.0–17.0)
Immature Granulocytes: 1 %
Lymphocytes Relative: 3 %
Lymphs Abs: 0.3 10*3/uL — ABNORMAL LOW (ref 0.7–4.0)
MCH: 37.4 pg — ABNORMAL HIGH (ref 26.0–34.0)
MCHC: 29.6 g/dL — ABNORMAL LOW (ref 30.0–36.0)
MCV: 126.3 fL — AB (ref 80.0–100.0)
Monocytes Absolute: 0.4 10*3/uL (ref 0.1–1.0)
Monocytes Relative: 4 %
NEUTROS ABS: 10 10*3/uL — AB (ref 1.7–7.7)
Neutrophils Relative %: 92 %
Platelet Count: 268 10*3/uL (ref 150–400)
RBC: 2.43 MIL/uL — ABNORMAL LOW (ref 4.22–5.81)
RDW: 23.7 % — ABNORMAL HIGH (ref 11.5–15.5)
WBC Count: 10.8 10*3/uL — ABNORMAL HIGH (ref 4.0–10.5)
nRBC: 0 % (ref 0.0–0.2)

## 2018-05-04 MED ORDER — MIRTAZAPINE 7.5 MG PO TABS: 7.5000 mg | ORAL_TABLET | Freq: Every day | ORAL | 2 refills | Status: AC

## 2018-05-04 MED ORDER — HEPARIN SOD (PORK) LOCK FLUSH 100 UNIT/ML IV SOLN
500.0000 [IU] | Freq: Once | INTRAVENOUS | Status: AC
  Administered 2018-05-04: 500 [IU]
  Filled 2018-05-04: qty 5

## 2018-05-04 MED ORDER — SODIUM CHLORIDE 0.9 % IV SOLN
INTRAVENOUS | Status: AC
  Administered 2018-05-04: 14:00:00 via INTRAVENOUS
  Filled 2018-05-04 (×2): qty 250

## 2018-05-04 MED ORDER — SODIUM CHLORIDE 0.9% FLUSH
10.0000 mL | Freq: Once | INTRAVENOUS | Status: AC
  Administered 2018-05-04: 10 mL
  Filled 2018-05-04: qty 10

## 2018-05-04 NOTE — Patient Instructions (Signed)

## 2018-05-04 NOTE — Telephone Encounter (Signed)
Wife states she is very worried about Mr Adrian [name]. He has been "feeling bad X 3-4 days, not eating much".   Will come in today at 1230 for labs and see Sandi Mealy, PA at 1300.  Lab orders placed and message to scheduler

## 2018-05-04 NOTE — Progress Notes (Signed)
Pt presents today with family who reports increased fatigue, decreased eating and drinking, increased sleeping, increased weakness, and "cognition issues like forgetting what the right words are for things and being very forgetful".  Pt A&Ox4.  Pt denies all concerns except "feeling tired".  Pt given 1L NS IVF, tolerated well.  States he's feeling a bit better.  Was able to have a slice of cake.

## 2018-05-10 NOTE — Progress Notes (Signed)
[facility] Progress Note   Adrian [name] [id number] 1950-09-17 68 y.o.  [name] is managed by Dr. [name]  Actively treated with chemotherapy/immunotherapy/hormonal therapy: No  Current Therapy: None  Last Treated: N/A  Assessment: Plan:    Dehydration - Plan: 0.9 %  sodium chloride infusion  Other pancytopenia (HCC) - Plan: heparin lock flush 100 unit/mL, sodium chloride flush (NS) 0.9 % injection 10 mL  MDS (myelodysplastic syndrome), low grade (HCC) - Plan: heparin lock flush 100 unit/mL, sodium chloride flush (NS) 0.9 % injection 10 mL  Anorexia    1) Dehydration and anorexia: The patient was given 1 L NS today.  He was able to eat a little while here and was also given samples of various nutritional supplements.  2) MDS: A CBC returned with a WBC of 10.8, Hemoglobin of 9.1, Hematocrit 30.7, and a platelet count of 268 today.  He will return on 05/16/18.  3) Cancer of upper lobe of left lung: The patient is managed by Dr. Alvy Bimler and is currently receiving supportive care only.   Please see After Visit Summary for patient specific instructions.  Future Appointments  Date Time Provider [place]  05/16/2018 10:15 AM [facility] ONLY [facility] None  05/16/2018 10:30 AM [facility] [place] FLUSH [facility] None  05/16/2018 11:00 AM [name], MD [facility] None  05/16/2018 11:15 AM [facility] INFUSION [facility] None  01/28/2019 11:00 AM Garvin Fila, MD [facility] None    No orders of the defined types were placed in this encounter.      Subjective:   Patient ID:  Adrian [name] is a 68 y.o. (DOB 1950/10/28) male.  Chief Complaint:  Chief Complaint  Patient presents with  . Fatigue    HPI KIRTAN SADA is a 68 y.o. who is managed by Dr. Alvy Bimler.  He has a history of lung cancer and MDS.  He is being managed with supportive care.  He presents today with fatigue, weakness, anorexia, and increasing somnolence.  He is cold  intolerant.  He denies fever, sweats, nausea, vomiting, or diarrhea.  He requests home health.  Medications: I have reviewed the patient's current medications.  Allergies:  Allergies  Allergen Reactions  . No Known Allergies     Past Medical History:  Diagnosis Date  . Anxiety   . Arthritis    rheumatoid  . Cancer ([facility]) 03/11/2013   larnyx/epiglottic Squamous cell in situ  . Emphysema of lung ([facility])   . Esophageal reflux    not current  . History of blood transfusion   . History of radiation therapy 05/13/2013-06/28/2013   68 gray to epiglottis/neck  . History of radiation therapy 04/06/17-04/18/17   right SBRT lung 50 Gy in 5 fractions  . Megaloblastic anemia   . Pneumonia 01/2013  . Seizures ([facility])    last one was 8 years ago- 2008  . Shortness of breath    with exertion  . Throat pain 06/13/2013    Past Surgical History:  Procedure Laterality Date  . ABDOMINAL HERNIA REPAIR    . COLONOSCOPY    . IR FLUORO GUIDE PORT INSERTION RIGHT  08/31/2016  . IR US GUIDE VASC ACCESS RIGHT  08/31/2016  . MINOR PLACEMENT OF FIDUCIAL  08/10/2016   Procedure: MINOR PLACEMENT OF FIDUCIAL x3;  Surgeon: Collene Gobble, MD;  Location: [facility];  Service: Thoracic;;  . MULTIPLE EXTRACTIONS WITH ALVEOLOPLASTY N/A 04/16/2013   Procedure: Extraction of tooth #'s 2,5,6,7,10,11,15,17,21,22,27,29 with alveoloplasty, bilateral  maxillary fibrous tuberosity reductions, removal of excess tissues of mandibular arch, and mandibular left lingual exostoses reductions.;  Surgeon: Lenn Cal, DDS;  Location: [facility];  Service: Oral Surgery;  Laterality: N/A;  . PEG PLACEMENT  2014  . tracheostomy  2014  . VIDEO BRONCHOSCOPY WITH ENDOBRONCHIAL NAVIGATION N/A 08/10/2016   Procedure: VIDEO BRONCHOSCOPY WITH ENDOBRONCHIAL NAVIGATION;  Surgeon: Collene Gobble, MD;  Location: [facility];  Service: Thoracic;  Laterality: N/A;    Family History  Problem Relation Age of Onset  . Hypertension Mother   . Arthritis/Rheumatoid  Mother   . Stroke Mother     Social History   Socioeconomic History  . Marital status: Married    Spouse name: Hassan Rowan  . Number of children: 3  . Years of education: 12th  . Highest education level: Not on file  Occupational History  . Occupation: Retired    Fish farm manager: [employer]  Social Needs  . Financial resource strain: Not on file  . Food insecurity:    Worry: Not on file    Inability: Not on file  . Transportation needs:    Medical: Not on file    Non-medical: Not on file  Tobacco Use  . Smoking status: Former Smoker    Packs/day: 0.12    Years: 40.00    Pack years: 4.80    Types: Cigarettes    Last attempt to quit: 10/04/2016    Years since quitting: 1.5  . Smokeless tobacco: Never Used  . Tobacco comment: smoke 1 to 2 a day  Substance and Sexual Activity  . Alcohol use: No    Alcohol/week: 0.0 standard drinks  . Drug use: No  . Sexual activity: Not on file  Lifestyle  . Physical activity:    Days per week: Not on file    Minutes per session: Not on file  . Stress: Not on file  Relationships  . Social connections:    Talks on phone: Not on file    Gets together: Not on file    Attends religious service: Not on file    Active member of club or organization: Not on file    Attends meetings of clubs or organizations: Not on file    Relationship status: Not on file  . Intimate partner violence:    Fear of current or ex partner: Not on file    Emotionally abused: Not on file    Physically abused: Not on file    Forced sexual activity: Not on file  Other Topics Concern  . Not on file  Social History Narrative   04/12/2013   The patient is married with 3 children. (2 girls and one boy)    The patient has a history of smoking a quarter pack per day for 15 years.   The patient is trying to quit on his own.   Patient has not had any alcohol for the past 6 months by report. Patient usually drinks vodka.   Caffeine Use: 2 cups daily             Past Medical  History, Surgical history, Social history, and Family history were reviewed and updated as appropriate.   Please see review of systems for further details on the patient's review from today.   Review of Systems:  Review of Systems  Constitutional: Positive for appetite change, chills, fatigue and unexpected weight change. Negative for diaphoresis and fever.  HENT: Negative for trouble swallowing.   Respiratory: Negative for cough, chest tightness  and shortness of breath.   Cardiovascular: Negative for chest pain and palpitations.  Gastrointestinal: Negative for abdominal pain, constipation, diarrhea, nausea and vomiting.  Neurological: Positive for weakness. Negative for dizziness and headaches.  Psychiatric/Behavioral: Positive for sleep disturbance.    Objective:   Physical Exam:  BP 103/69 (BP Location: Right Arm, Patient Position: Sitting)   Pulse 88   Temp (!) 97.4 F (36.3 C) (Oral)   Resp 18   Ht 6' (1.829 m)   Wt 95 lb 12.8 oz (43.5 kg)   SpO2 97%   BMI 12.99 kg/m  ECOG: 1  Physical Exam Constitutional:      General: He is not in acute distress.    Appearance: He is not diaphoretic.     Comments: The patient is a chronically ill-appearing adult male who appears older than his stated age.  He appears to be in no acute distress.  HENT:     Head: Normocephalic and atraumatic.     Mouth/Throat:     Mouth: Mucous membranes are dry.     Pharynx: No oropharyngeal exudate.  Neck:     Musculoskeletal: Normal range of motion and neck supple.  Cardiovascular:     Rate and Rhythm: Normal rate and regular rhythm.     Heart sounds: Normal heart sounds. No murmur. No friction rub. No gallop.   Pulmonary:     Effort: Pulmonary effort is normal. No respiratory distress.     Breath sounds: Normal breath sounds. No wheezing or rales.  Lymphadenopathy:     Cervical: No cervical adenopathy.  Skin:    General: Skin is warm and dry.     Findings: No erythema or rash.    Neurological:     Mental Status: He is alert.     Coordination: Coordination abnormal (The patient is ambulating with the use of a wheelchair.).  Psychiatric:        Behavior: Behavior normal.        Thought Content: Thought content normal.        Judgment: Judgment normal.     Lab Review:     Component Value Date/Time   NA 145 05/04/2018 1241   NA 142 05/03/2017 1114   K 5.1 05/04/2018 1241   K 4.5 05/03/2017 1114   CL 114 (H) 05/04/2018 1241   CL 107 09/19/2012 1325   CO2 24 05/04/2018 1241   CO2 21 (L) 05/03/2017 1114   GLUCOSE 75 05/04/2018 1241   GLUCOSE 88 05/03/2017 1114   GLUCOSE 97 09/19/2012 1325   BUN 49 (H) 05/04/2018 1241   BUN 18.5 05/03/2017 1114   CREATININE 1.06 05/04/2018 1241   CREATININE 0.9 05/03/2017 1114   CALCIUM 8.8 (L) 05/04/2018 1241   CALCIUM 8.9 05/03/2017 1114   PROT 8.2 (H) 05/04/2018 1241   PROT 8.6 (H) 05/03/2017 1114   ALBUMIN 2.7 (L) 05/04/2018 1241   ALBUMIN 3.2 (L) 05/03/2017 1114   AST 15 05/04/2018 1241   AST 9 05/03/2017 1114   ALT 13 05/04/2018 1241   ALT <6 05/03/2017 1114   ALKPHOS 84 05/04/2018 1241   ALKPHOS 126 05/03/2017 1114   BILITOT 0.3 05/04/2018 1241   BILITOT 0.34 05/03/2017 1114   GFRNONAA >60 05/04/2018 1241   GFRAA >60 05/04/2018 1241       Component Value Date/Time   WBC 10.8 (H) 05/04/2018 1241   WBC 8.2 04/25/2018 1005   RBC 2.43 (L) 05/04/2018 1241   HGB 9.1 (L) 05/04/2018 1241   HGB 8.7 (  L) 05/03/2017 1114   HCT 30.7 (L) 05/04/2018 1241   HCT 26.6 (L) 05/03/2017 1114   PLT 268 05/04/2018 1241   PLT 112 (L) 05/03/2017 1114   MCV 126.3 (H) 05/04/2018 1241   MCV 93.2 05/03/2017 1114   MCH 37.4 (H) 05/04/2018 1241   MCHC 29.6 (L) 05/04/2018 1241   RDW 23.7 (H) 05/04/2018 1241   RDW 17.2 (H) 05/03/2017 1114   LYMPHSABS 0.3 (L) 05/04/2018 1241   LYMPHSABS 0.6 (L) 05/03/2017 1114   MONOABS 0.4 05/04/2018 1241   MONOABS 1.0 (H) 05/03/2017 1114   EOSABS 0.0 05/04/2018 1241   EOSABS 0.1 05/03/2017  1114   BASOSABS 0.0 05/04/2018 1241   BASOSABS 0.0 05/03/2017 1114   -------------------------------  Imaging from last 24 hours (if applicable):  Radiology interpretation: No results found.

## 2018-05-16 ENCOUNTER — Inpatient Hospital Stay (HOSPITAL_BASED_OUTPATIENT_CLINIC_OR_DEPARTMENT_OTHER): Payer: Medicare Other | Admitting: Hematology and Oncology

## 2018-05-16 ENCOUNTER — Inpatient Hospital Stay: Payer: Medicare Other

## 2018-05-16 ENCOUNTER — Inpatient Hospital Stay: Payer: Medicare Other | Attending: Hematology and Oncology

## 2018-05-16 DIAGNOSIS — D4620 Refractory anemia with excess of blasts, unspecified: Secondary | ICD-10-CM

## 2018-05-16 DIAGNOSIS — R0682 Tachypnea, not elsewhere classified: Secondary | ICD-10-CM | POA: Insufficient documentation

## 2018-05-16 DIAGNOSIS — Z9221 Personal history of antineoplastic chemotherapy: Secondary | ICD-10-CM | POA: Diagnosis not present

## 2018-05-16 DIAGNOSIS — D61818 Other pancytopenia: Secondary | ICD-10-CM

## 2018-05-16 DIAGNOSIS — Z79899 Other long term (current) drug therapy: Secondary | ICD-10-CM | POA: Insufficient documentation

## 2018-05-16 DIAGNOSIS — Z923 Personal history of irradiation: Secondary | ICD-10-CM | POA: Insufficient documentation

## 2018-05-16 DIAGNOSIS — D469 Myelodysplastic syndrome, unspecified: Secondary | ICD-10-CM | POA: Insufficient documentation

## 2018-05-16 DIAGNOSIS — R0602 Shortness of breath: Secondary | ICD-10-CM | POA: Diagnosis not present

## 2018-05-16 DIAGNOSIS — R64 Cachexia: Secondary | ICD-10-CM | POA: Insufficient documentation

## 2018-05-16 DIAGNOSIS — I700 Atherosclerosis of aorta: Secondary | ICD-10-CM | POA: Diagnosis not present

## 2018-05-16 DIAGNOSIS — Z8521 Personal history of malignant neoplasm of larynx: Secondary | ICD-10-CM | POA: Diagnosis not present

## 2018-05-16 DIAGNOSIS — Z7952 Long term (current) use of systemic steroids: Secondary | ICD-10-CM | POA: Insufficient documentation

## 2018-05-16 DIAGNOSIS — C3412 Malignant neoplasm of upper lobe, left bronchus or lung: Secondary | ICD-10-CM | POA: Insufficient documentation

## 2018-05-16 DIAGNOSIS — I2510 Atherosclerotic heart disease of native coronary artery without angina pectoris: Secondary | ICD-10-CM | POA: Insufficient documentation

## 2018-05-16 DIAGNOSIS — J449 Chronic obstructive pulmonary disease, unspecified: Secondary | ICD-10-CM | POA: Diagnosis not present

## 2018-05-16 DIAGNOSIS — E860 Dehydration: Secondary | ICD-10-CM | POA: Insufficient documentation

## 2018-05-16 LAB — CBC WITH DIFFERENTIAL/PLATELET
Abs Immature Granulocytes: 0.03 10*3/uL (ref 0.00–0.07)
Basophils Absolute: 0 10*3/uL (ref 0.0–0.1)
Basophils Relative: 1 %
Eosinophils Absolute: 0 10*3/uL (ref 0.0–0.5)
Eosinophils Relative: 1 %
HCT: 28.6 % — ABNORMAL LOW (ref 39.0–52.0)
Hemoglobin: 8.8 g/dL — ABNORMAL LOW (ref 13.0–17.0)
Immature Granulocytes: 1 %
Lymphocytes Relative: 6 %
Lymphs Abs: 0.4 10*3/uL — ABNORMAL LOW (ref 0.7–4.0)
MCH: 38.1 pg — ABNORMAL HIGH (ref 26.0–34.0)
MCHC: 30.8 g/dL (ref 30.0–36.0)
MCV: 123.8 fL — ABNORMAL HIGH (ref 80.0–100.0)
Monocytes Absolute: 0.5 10*3/uL (ref 0.1–1.0)
Monocytes Relative: 8 %
Neutro Abs: 5.1 10*3/uL (ref 1.7–7.7)
Neutrophils Relative %: 83 %
Platelets: 198 10*3/uL (ref 150–400)
RBC: 2.31 MIL/uL — AB (ref 4.22–5.81)
RDW: 22.2 % — ABNORMAL HIGH (ref 11.5–15.5)
WBC: 6.1 10*3/uL (ref 4.0–10.5)
nRBC: 0 % (ref 0.0–0.2)

## 2018-05-16 LAB — SAMPLE TO BLOOD BANK

## 2018-05-16 MED ORDER — HEPARIN SOD (PORK) LOCK FLUSH 100 UNIT/ML IV SOLN
500.0000 [IU] | Freq: Once | INTRAVENOUS | Status: AC
  Administered 2018-05-16: 500 [IU]
  Filled 2018-05-16: qty 5

## 2018-05-16 MED ORDER — DARBEPOETIN ALFA 500 MCG/ML IJ SOSY
500.0000 ug | PREFILLED_SYRINGE | Freq: Once | INTRAMUSCULAR | Status: AC
  Administered 2018-05-16: 500 ug via SUBCUTANEOUS

## 2018-05-16 MED ORDER — SODIUM CHLORIDE 0.9% FLUSH
10.0000 mL | Freq: Once | INTRAVENOUS | Status: AC
  Administered 2018-05-16: 10 mL
  Filled 2018-05-16: qty 10

## 2018-05-16 MED ORDER — DARBEPOETIN ALFA 500 MCG/ML IJ SOSY
PREFILLED_SYRINGE | INTRAMUSCULAR | Status: AC
  Filled 2018-05-16: qty 1

## 2018-05-16 MED ORDER — SODIUM CHLORIDE 0.9% FLUSH
10.0000 mL | Freq: Once | INTRAVENOUS | Status: DC
  Filled 2018-05-16: qty 10

## 2018-05-16 NOTE — Patient Instructions (Signed)
[facility] Discharge Instructions for Patients Receiving Chemotherapy  Today you received the following chemotherapy agents Aranesp.  To help prevent nausea and vomiting after your treatment, we encourage you to take your nausea medication.   If you develop nausea and vomiting that is not controlled by your nausea medication, call the clinic.   BELOW ARE SYMPTOMS THAT SHOULD BE REPORTED IMMEDIATELY:  *FEVER GREATER THAN 100.5 F  *CHILLS WITH OR WITHOUT FEVER  NAUSEA AND VOMITING THAT IS NOT CONTROLLED WITH YOUR NAUSEA MEDICATION  *UNUSUAL SHORTNESS OF BREATH  *UNUSUAL BRUISING OR BLEEDING  TENDERNESS IN MOUTH AND THROAT WITH OR WITHOUT PRESENCE OF ULCERS  *URINARY PROBLEMS  *BOWEL PROBLEMS  UNUSUAL RASH Items with * indicate a potential emergency and should be followed up as soon as possible.  Feel free to call the clinic should you have any questions or concerns. The clinic phone number is [phone].  Please show the [facility] at check-in to the Emergency Department and triage nurse.

## 2018-05-17 ENCOUNTER — Encounter: Payer: Self-pay | Admitting: Hematology and Oncology

## 2018-05-17 NOTE — Progress Notes (Signed)
[place] OFFICE PROGRESS NOTE  Patient Care Team: [name], MD as PCP - General (Family Medicine) [name], MD as Consulting Physician (Hematology and Oncology)  ASSESSMENT & PLAN:  Cancer of upper lobe of left lung [facility]) Previously, I explained to the patient and family members again the rationale of not pursuing further palliative chemotherapy due to his frail status and refractory to multiple treatment I recommend we focus on supportive care only They agree with the plan of care  MDS (myelodysplastic syndrome), low grade ([place]) He appears to be responding well to low-dose prednisone therapy along with transfusion support and darbepoetin injection I recommend slow prednisone taper of 10 mg alternate with 20 mg every other day We discussed some of the risks, benefits, and alternatives of blood transfusions. The patient is symptomatic from anemia and the hemoglobin level is critically low.  Some of the side-effects to be expected including risks of transfusion reactions, chills, infection, syndrome of volume overload and risk of hospitalization from various reasons and the patient is willing to proceed and went ahead to sign consent today. He will receive a unit of blood whenever hemoglobin is less than 8.  Malignant cachexia ([place]) He has gained some weight with improved energy and appetite while on prednisone He will continue indefinitely He has appointment to see dietitian periodically Recently, he received IV fluid support for dehydration We have consulted advanced home care service to deliver IV fluids at home for him   No orders of the defined types were placed in this encounter.   INTERVAL HISTORY: Please see below for problem oriented charting. He is seen again for further follow-up The patient is prone to get dehydrated due to poor oral intake He has not lost any weight Denies recent infection, fever or chills No recent cough The patient denies  any recent signs or symptoms of bleeding such as spontaneous epistaxis, hematuria or hematochezia.   SUMMARY OF ONCOLOGIC HISTORY: Oncology History   MDS, R-IPSS score of 3, low risk (Hg 8.9, +16 chromosome on BM, 2% blast count)   Squamous cell carcinoma of the epiglottis   Primary site: Larynx - Supraglottis (Left)   Staging method: AJCC 7th Edition   Clinical: Stage I (T1, N0, M0) signed by [name], MD on 04/30/2013 12:49 PM   Pathologic: Stage I (T1, N0, cM0) signed by [name], MD on 04/30/2013 12:49 PM   Summary: Stage I (T1, N0, cM0)       MDS (myelodysplastic syndrome), low grade ([place])   02/01/2007 Bone Marrow Biopsy    BM biopsy was abnormal, overall probable low grade MDS    03/23/2011 - 02/06/2013 Chemotherapy    [name], discontinued due to diagnosis of laryngeal ca    09/06/2013 Bone Marrow Biopsy    Repeat bone marrow aspirate and biopsy confirmed low-grade myelodysplastic syndrome.    09/17/2013 -  Chemotherapy    The patient resumed darbepoetin injections to treat the anemia.     History of head and neck cancer   03/06/2013 Procedure    Biopsy from epiglottic region showed invasive [facility]    04/25/2013 Imaging    Ct scan showed no other involvement in the LN    05/13/2013 - 06/28/2013 Radiation Therapy    He received radiation therapy, 70 gray in 35 fractions to the larynx    06/09/2016 Imaging    CT scan of the chest showed Bilateral spiculated soft tissue pulmonary masses, highly suspicious for multifocal malignancy. Borderline enlarged  left-sided mediastinal lymphadenopathy. Background of severe emphysematous changes, chronic cylindrical bronchiectasis and hyperinflation of the lungs. Findings consistent with longstanding COPD. Diffusely thickened interstitial markings. This may represent chronic interstitial lung changes, however lymphangitic spread of malignancy cannot be excluded. Anterior compression deformities of several of the thoracic  vertebral bodies, with heterogeneous appearance of T7 and T9 vertebral body. This may represent degenerative changes, however osseous metastatic disease cannot be excluded.     07/11/2016 PET scan    There are 2 nodules in the left upper lobe and 2 nodules within the right lower lobe which have spiculated margin and exhibit intense radiotracer uptake compatible with multifocal pulmonary metastasis versus multiple synchronous primary pulmonary neoplasms. 2. Emphysema 3. Aortic atherosclerosis and multi vessel coronary artery calcification.     Cancer of upper lobe of left lung ([place])   06/09/2016 Imaging    CT chest: Bilateral spiculated soft tissue pulmonary masses, highly suspicious for multifocal malignancy. Borderline enlarged left-sided mediastinal lymphadenopathy. Background of severe emphysematous changes, chronic cylindrical bronchiectasis and hyperinflation of the lungs. Findings consistent with longstanding COPD. Diffusely thickened interstitial markings. This may represent chronic interstitial lung changes, however lymphangitic spread of malignancy cannot be excluded. Anterior compression deformities of several of the thoracic vertebral bodies, with heterogeneous appearance of T7 and T9 vertebral body. This may represent degenerative changes, however osseous metastatic disease cannot be excluded.     06/29/2016 Imaging    MRI brain: No evidence of metastatic disease or recent infarction. Advanced generalized brain atrophy with moderate to marked chronic small-vessel ischemic changes throughout. FLAIR imaging appears to show scattered gyri showing FLAIR hyperintensity. These abnormalities are not confirmed with restricted diffusion or contrast enhancement. Therefore, we are not certain that this is not artifactual. The differential diagnosis does include true pathology such as posterior reversible encephalopathy, viral or prion disease, postictal change in post chemotherapy change. If there is  concern about active CNS pathology, re- scanning in 4-6 weeks could be useful.    07/27/2016 Imaging    CT chest: Bilateral pulmonary lesions. These are suspicious for metastatic disease. Lesions have minimally changed but there may be slight enlargement of the cavitary lesion in the right lower lobe. Severe emphysematous changes.     08/10/2016 Pathology Results    1. Lung, biopsy, RUL, (apical segment) - SCANT BENIGN LUNG PARENCHYMA. - THERE IS NO EVIDENCE OF MALIGNANCY. 2. Lung, biopsy, LUL lingula - SQUAMOUS CELL CARCINOMA. - SEE COMMENT. 3. Lung, biopsy, LUL - ADENOCARCINOMA. - SEE COMMENT. Microscopic Comment 2. The malignant cells are positive for cytokeratin 56 and p63. They are negative for TTF-1. The findings are consistent with squamous cell carcinoma. There is likely insufficient tissue remaining for additional studies, if requested. 3. The malignant cells are positive for TTF-1 and negative for p63 and cytokeratin 5/6. The findings are consistent with adenocarcinoma.    08/10/2016 Procedure    The targets were defined as follows: Left upper lobe nodule, target 1 Lingular nodule, target 2 Proximal right lower lobe nodule, target 3 More distal right lower lobe, nodule  The extendable working channel was secured into place and the locator guide was withdrawn. Under fluoroscopic guidance transbronchial needle brushings, transbronchial Wang needle biopsies, and transbronchial forceps biopsies were performed in the LUL (target 1) and the lingula (target 2) to be sent for cytology and pathology. A bronchioalveolar lavage was performed in the lingula in the vicinity of target 2  and sent for cytology and microbiology (bacterial, fungal, AFB smears and cultures).  Fiducial markers  were then placed around target 1, target 2, and in the right lower lobe in the vicinity of both target 3 and target 4 to be used for possible radiation therapy should this be indicated. At the end of the  procedure a general airway inspection was performed and there was no evidence of active bleeding. The bronchoscope was removed.  The patient tolerated the procedure well. There was no significant blood loss and there were no obvious complications. A post-procedural chest x-ray is pending.  Samples: 1. Transbronchial needle brushings from targets 1 and 2 2. Transbronchial Wang needle biopsies from targets 1 and 2 3. Transbronchial forceps biopsies from targets 1 and 2 4. Bronchoalveolar lavage from lingula, target 2 5. Endobronchial biopsies from RUL apical segment 6. Endobronchial brushings from right upper lobe apical segment     08/10/2016 Genetic Testing    Patient has genetic testing done for [employer] and PD-L1 testing Results revealed PD-L1 testing is negative 0%    08/31/2016 Procedure    Technically successful right IJ power-injectable port catheter placement. Ready for routine use    09/21/2016 - 06/08/2017 Chemotherapy    The patient had chemotherapy with carboplatin and Taxol.    11/30/2016 PET scan    1. Interval resection of the hypermetabolic lingular nodule. 2. The 3 other spiculated, hypermetabolic nodules within the left upper and right lower lobes are slightly smaller with decreased metabolic activity, consistent with some response to interval therapy. 3. No progressive disease    03/06/2017 PET scan    1. Mixed appearance, with significant improvement in the left upper lobe nodule ; stable appearance of the more cephalad right lower lobe nodule; but enlargement and increased in metabolic activity of the more inferior right lower lobe nodule which appears cavitary. No new nodule. 2. Previously there was some moderate increase in diffuse marrow activity. That has increased further, and could be a response from prior granulocyte/marrow stimulation, although I cannot completely exclude the possibility that this is related to the patient's myelodysplastic syndrome. No CT  correlate. 3. Other imaging findings of potential clinical significance: Aortic Atherosclerosis (ICD10-I70.0) and Emphysema (ICD10-J43.9). Coronary atherosclerosis. Low-density blood pool suggests anemia. Chronic right upper lobe scarring. Mucus in both mainstem bronchi. Suspected left renal cysts. Indistinct but likely enlarged prostate gland.    04/06/2017 - 04/18/2017 Radiation Therapy    He received SBRT treatment Radiation treatment dates:   04/06/17 - 04/18/17  Site/dose:   Right SBRT Lung// 50 Gy in 5 fx  Beams/energy:   Photon // SRBT/ SBT-3D     06/26/2017 PET scan    1. Clear interval response to therapy of the 2 right lower lobe pulmonary nodules, each decreased in both size and hypermetabolism. 2. Stable size and hypermetabolic activity in the left upper lobe nodule. 3. Diffusely decreased FDG accumulation within the marrow space. 4. Symmetric uptake in both parotid glands is likely physiologic. Given the symmetry, this is most likely physiologic or inflammatory. 5. Prostatomegaly with marked bladder distention. Component of bladder outlet obstruction suspected. 6. Emphysema. (ICD10-J43.9) 7. Aortic Atherosclerois (ICD10-170.0)    09/25/2017 PET scan    1. New and enlarging small hypermetabolic nodules in the lingula are compatible with metastatic disease. These are in proximity to the linear scarring fiducial markers. 2. New hypermetabolic subcarinal nodal metastasis. 3. Left upper lobe index nodule is stable to slightly progressed in the interval in shows mild increase in hypermetabolism. 4. No substantial change in the 2 index right lower lobe nodules showing only  minimal FDG uptake, as before. 5. Interval progression of patchy airspace opacity in both dependent lower lungs, compatible with infection/inflammation.    10/12/2017 - 12/13/2017 Chemotherapy    The patient had atezolizumab for chemotherapy treatment.     01/02/2018 PET scan    2.8 cm left upper lobe nodule,  increased, corresponding to known primary bronchogenic neoplasm.  Two lingular nodules measuring up to 11 mm, mildly increased, suspicious for metastases.  12 mm short axis subcarinal node, worrisome for nodal metastasis, grossly unchanged.  Patchy right lower lobe opacity, suspicious for pneumonia. Mild left basilar opacity, improved.     REVIEW OF SYSTEMS:   Constitutional: Denies fevers, chills or abnormal weight loss Eyes: Denies blurriness of vision Ears, nose, mouth, throat, and face: Denies mucositis or sore throat Respiratory: Denies cough, dyspnea or wheezes Cardiovascular: Denies palpitation, chest discomfort or lower extremity swelling Gastrointestinal:  Denies nausea, heartburn or change in bowel habits Skin: Denies abnormal skin rashes Lymphatics: Denies new lymphadenopathy or easy bruising Neurological:Denies numbness, tingling or new weaknesses Behavioral/Psych: Mood is stable, no new changes  All other systems were reviewed with the patient and are negative.  I have reviewed the past medical history, past surgical history, social history and family history with the patient and they are unchanged from previous note.  ALLERGIES:  is allergic to no known allergies.  MEDICATIONS:  Current Outpatient Medications  Medication Sig Dispense Refill  . albuterol (PROVENTIL HFA;VENTOLIN HFA) 108 (90 Base) MCG/ACT inhaler Inhale 2 puffs into the lungs every 4 (four) hours as needed for wheezing or shortness of breath. 1 Inhaler 5  . ferrous sulfate 325 (65 FE) MG tablet Take 325 mg by mouth daily with breakfast.    . folic acid (FOLVITE) 1 MG tablet Take 1 mg by mouth daily.    . Glycopyrrolate-Formoterol (BEVESPI AEROSPHERE) 9-4.8 MCG/ACT AERO Inhale 2 puffs into the lungs 2 (two) times daily. 2 Inhaler 0  . levETIRAcetam (KEPPRA) 500 MG tablet Take 1 tablet (500 mg total) by mouth 2 (two) times daily. 180 tablet 3  . mirtazapine (REMERON) 7.5 MG tablet Take 1 tablet (7.5 mg  total) by mouth at bedtime. 30 tablet 2  . predniSONE (DELTASONE) 20 MG tablet Take 1 tablet (20 mg total) by mouth daily with breakfast. (Patient taking differently: Take 10 mg by mouth daily with breakfast. ) 30 tablet 11  . vitamin E 1000 UNIT capsule Take by mouth.     No current facility-administered medications for this visit.     PHYSICAL EXAMINATION: ECOG PERFORMANCE STATUS: 2 - Symptomatic, <50% confined to bed  Vitals:   05/16/18 1110  BP: 100/66  Pulse: 82  Resp: 18  Temp: (!) 97.3 F (36.3 C)   Filed Weights   05/16/18 1110  Weight: 96 lb (43.5 kg)    GENERAL:alert, no distress and comfortable.  He looks thin and cachectic SKIN: skin color, texture, turgor are normal, no rashes or significant lesions EYES: normal, Conjunctiva are pink and non-injected, sclera clear OROPHARYNX:no exudate, no erythema and lips, buccal mucosa, and tongue normal  NECK: supple, thyroid normal size, non-tender, without nodularity LYMPH:  no palpable lymphadenopathy in the cervical, axillary or inguinal LUNGS: clear to auscultation and percussion with normal breathing effort HEART: regular rate & rhythm and no murmurs and no lower extremity edema ABDOMEN:abdomen soft, non-tender and normal bowel sounds Musculoskeletal:no cyanosis of digits with signs of digital clubbing  NEURO: alert & oriented x 3 with fluent speech, no focal motor/sensory deficits  LABORATORY DATA:  I have reviewed the data as listed    Component Value Date/Time   NA 145 05/04/2018 1241   NA 142 05/03/2017 1114   K 5.1 05/04/2018 1241   K 4.5 05/03/2017 1114   CL 114 (H) 05/04/2018 1241   CL 107 09/19/2012 1325   CO2 24 05/04/2018 1241   CO2 21 (L) 05/03/2017 1114   GLUCOSE 75 05/04/2018 1241   GLUCOSE 88 05/03/2017 1114   GLUCOSE 97 09/19/2012 1325   BUN 49 (H) 05/04/2018 1241   BUN 18.5 05/03/2017 1114   CREATININE 1.06 05/04/2018 1241   CREATININE 0.9 05/03/2017 1114   CALCIUM 8.8 (L) 05/04/2018 1241    CALCIUM 8.9 05/03/2017 1114   PROT 8.2 (H) 05/04/2018 1241   PROT 8.6 (H) 05/03/2017 1114   ALBUMIN 2.7 (L) 05/04/2018 1241   ALBUMIN 3.2 (L) 05/03/2017 1114   AST 15 05/04/2018 1241   AST 9 05/03/2017 1114   ALT 13 05/04/2018 1241   ALT <6 05/03/2017 1114   ALKPHOS 84 05/04/2018 1241   ALKPHOS 126 05/03/2017 1114   BILITOT 0.3 05/04/2018 1241   BILITOT 0.34 05/03/2017 1114   GFRNONAA >60 05/04/2018 1241   GFRAA >60 05/04/2018 1241    No results found for: SPEP, UPEP  Lab Results  Component Value Date   WBC 6.1 05/16/2018   NEUTROABS 5.1 05/16/2018   HGB 8.8 (L) 05/16/2018   HCT 28.6 (L) 05/16/2018   MCV 123.8 (H) 05/16/2018   PLT 198 05/16/2018      Chemistry      Component Value Date/Time   NA 145 05/04/2018 1241   NA 142 05/03/2017 1114   K 5.1 05/04/2018 1241   K 4.5 05/03/2017 1114   CL 114 (H) 05/04/2018 1241   CL 107 09/19/2012 1325   CO2 24 05/04/2018 1241   CO2 21 (L) 05/03/2017 1114   BUN 49 (H) 05/04/2018 1241   BUN 18.5 05/03/2017 1114   CREATININE 1.06 05/04/2018 1241   CREATININE 0.9 05/03/2017 1114      Component Value Date/Time   CALCIUM 8.8 (L) 05/04/2018 1241   CALCIUM 8.9 05/03/2017 1114   ALKPHOS 84 05/04/2018 1241   ALKPHOS 126 05/03/2017 1114   AST 15 05/04/2018 1241   AST 9 05/03/2017 1114   ALT 13 05/04/2018 1241   ALT <6 05/03/2017 1114   BILITOT 0.3 05/04/2018 1241   BILITOT 0.34 05/03/2017 1114       All questions were answered. The patient knows to call the clinic with any problems, questions or concerns. No barriers to learning was detected.  I spent 15 minutes counseling the patient face to face. The total time spent in the appointment was 20 minutes and more than 50% was on counseling and review of test results  [name], MD 05/17/2018 9:47 AM

## 2018-05-17 NOTE — Assessment & Plan Note (Signed)
He has gained some weight with improved energy and appetite while on prednisone He will continue indefinitely He has appointment to see dietitian periodically Recently, he received IV fluid support for dehydration We have consulted advanced home care service to deliver IV fluids at home for him

## 2018-05-17 NOTE — Assessment & Plan Note (Signed)
Previously, I explained to the patient and family members again the rationale of not pursuing further palliative chemotherapy due to his frail status and refractory to multiple treatment I recommend we focus on supportive care only They agree with the plan of care

## 2018-05-17 NOTE — Assessment & Plan Note (Signed)
He appears to be responding well to low-dose prednisone therapy along with transfusion support and darbepoetin injection I recommend slow prednisone taper of 10 mg alternate with 20 mg every other day We discussed some of the risks, benefits, and alternatives of blood transfusions. The patient is symptomatic from anemia and the hemoglobin level is critically low.  Some of the side-effects to be expected including risks of transfusion reactions, chills, infection, syndrome of volume overload and risk of hospitalization from various reasons and the patient is willing to proceed and went ahead to sign consent today. He will receive a unit of blood whenever hemoglobin is less than 8.

## 2018-05-21 ENCOUNTER — Other Ambulatory Visit: Payer: Self-pay | Admitting: Hematology and Oncology

## 2018-05-21 ENCOUNTER — Telehealth: Payer: Self-pay

## 2018-05-21 DIAGNOSIS — C3412 Malignant neoplasm of upper lobe, left bronchus or lung: Secondary | ICD-10-CM

## 2018-05-21 NOTE — Telephone Encounter (Signed)
Called and left below message. She verbalized understanding. Appt scheduled for 1/15 at 11 am.

## 2018-05-21 NOTE — Telephone Encounter (Signed)
Ok to schedule appt to see me Wednesday No need labs Tell them to get CXR first before coming to check in at the cancer center

## 2018-05-21 NOTE — Telephone Encounter (Signed)
Sister, Hassan Rowan called and left a message to call her.  Called back. Mr. Retter had a episode on Saturday of rapid breathing. Denies fever and cough. Symptoms resolved after a while. She thinks that it may be anxiety. He has used a inhaler in the past. Offered appt today with Sandi Mealy, PA. Sister declined she has to go to work. Requesting appt Wednesday am with Dr. Alvy Bimler.

## 2018-05-23 ENCOUNTER — Ambulatory Visit (HOSPITAL_COMMUNITY)
Admission: RE | Admit: 2018-05-23 | Discharge: 2018-05-23 | Disposition: A | Discharge status: Home / Self Care | Payer: Medicare Other | Source: Ambulatory Visit | Attending: Hematology and Oncology | Admitting: Hematology and Oncology

## 2018-05-23 ENCOUNTER — Inpatient Hospital Stay (HOSPITAL_BASED_OUTPATIENT_CLINIC_OR_DEPARTMENT_OTHER): Payer: Medicare Other | Admitting: Hematology and Oncology

## 2018-05-23 ENCOUNTER — Encounter: Payer: Self-pay | Admitting: Hematology and Oncology

## 2018-05-23 DIAGNOSIS — E860 Dehydration: Secondary | ICD-10-CM

## 2018-05-23 DIAGNOSIS — R05 Cough: Secondary | ICD-10-CM | POA: Diagnosis not present

## 2018-05-23 DIAGNOSIS — D469 Myelodysplastic syndrome, unspecified: Secondary | ICD-10-CM | POA: Diagnosis not present

## 2018-05-23 DIAGNOSIS — R0602 Shortness of breath: Secondary | ICD-10-CM | POA: Diagnosis not present

## 2018-05-23 DIAGNOSIS — Z79899 Other long term (current) drug therapy: Secondary | ICD-10-CM

## 2018-05-23 DIAGNOSIS — I700 Atherosclerosis of aorta: Secondary | ICD-10-CM

## 2018-05-23 DIAGNOSIS — C3412 Malignant neoplasm of upper lobe, left bronchus or lung: Secondary | ICD-10-CM | POA: Insufficient documentation

## 2018-05-23 DIAGNOSIS — Z7952 Long term (current) use of systemic steroids: Secondary | ICD-10-CM

## 2018-05-23 DIAGNOSIS — R64 Cachexia: Secondary | ICD-10-CM

## 2018-05-23 DIAGNOSIS — R0682 Tachypnea, not elsewhere classified: Secondary | ICD-10-CM

## 2018-05-23 DIAGNOSIS — Z8521 Personal history of malignant neoplasm of larynx: Secondary | ICD-10-CM

## 2018-05-23 DIAGNOSIS — I2510 Atherosclerotic heart disease of native coronary artery without angina pectoris: Secondary | ICD-10-CM

## 2018-05-23 DIAGNOSIS — Z923 Personal history of irradiation: Secondary | ICD-10-CM

## 2018-05-23 DIAGNOSIS — D4620 Refractory anemia with excess of blasts, unspecified: Secondary | ICD-10-CM

## 2018-05-23 DIAGNOSIS — J449 Chronic obstructive pulmonary disease, unspecified: Secondary | ICD-10-CM

## 2018-05-23 DIAGNOSIS — Z9221 Personal history of antineoplastic chemotherapy: Secondary | ICD-10-CM

## 2018-05-23 NOTE — Progress Notes (Signed)
[place] OFFICE PROGRESS NOTE  Patient Care Team: [name], MD as PCP - General (Family Medicine) [name], MD as Consulting Physician (Hematology and Oncology)  ASSESSMENT & PLAN:  Cancer of upper lobe of left lung [facility]) The patient has mild worsening shortness of breath recently I have reviewed the chest x-ray myself that did not reveal significant cancer progression There are no lobar consolidation to suggest pneumonia Examination today is benign without wheezes His oxygen saturation is within normal limits I do not recommend any further work-up and recommend continue low-dose prednisone therapy for COPD  Malignant cachexia ([place]) He has malignant cachexia and have difficulties with oral fluid intake We will consult advanced home care for IV fluids at home every other day, a liter as needed to help prevent dehydration  MDS (myelodysplastic syndrome), low grade ([place]) His recent blood work is satisfactory and he did not need blood transfusion  He will continue close follow-up with repeat blood work and darbepoetin injection or transfusion support as needed   No orders of the defined types were placed in this encounter.   INTERVAL HISTORY: Please see below for problem oriented charting. He is seen urgently today due to recent sensation of shortness of breath and tachypnea He continues to have mild productive cough of clear sputum No hemoptysis No recent fever or chills Previously, we discussed referral to advanced home care service for IV fluid hydration therapy but that has not been started yet His family members noted that he has poor oral fluid intake  SUMMARY OF ONCOLOGIC HISTORY: Oncology History   MDS, R-IPSS score of 3, low risk (Hg 8.9, +16 chromosome on BM, 2% blast count)   Squamous cell carcinoma of the epiglottis   Primary site: Larynx - Supraglottis (Left)   Staging method: AJCC 7th Edition   Clinical: Stage I (T1, N0, M0) signed by  [name], MD on 04/30/2013 12:49 PM   Pathologic: Stage I (T1, N0, cM0) signed by [name], MD on 04/30/2013 12:49 PM   Summary: Stage I (T1, N0, cM0)       MDS (myelodysplastic syndrome), low grade ([place])   02/01/2007 Bone Marrow Biopsy    BM biopsy was abnormal, overall probable low grade MDS    03/23/2011 - 02/06/2013 Chemotherapy    [name], discontinued due to diagnosis of laryngeal ca    09/06/2013 Bone Marrow Biopsy    Repeat bone marrow aspirate and biopsy confirmed low-grade myelodysplastic syndrome.    09/17/2013 -  Chemotherapy    The patient resumed darbepoetin injections to treat the anemia.     History of head and neck cancer   03/06/2013 Procedure    Biopsy from epiglottic region showed invasive [facility]    04/25/2013 Imaging    Ct scan showed no other involvement in the LN    05/13/2013 - 06/28/2013 Radiation Therapy    He received radiation therapy, 70 gray in 35 fractions to the larynx    06/09/2016 Imaging    CT scan of the chest showed Bilateral spiculated soft tissue pulmonary masses, highly suspicious for multifocal malignancy. Borderline enlarged left-sided mediastinal lymphadenopathy. Background of severe emphysematous changes, chronic cylindrical bronchiectasis and hyperinflation of the lungs. Findings consistent with longstanding COPD. Diffusely thickened interstitial markings. This may represent chronic interstitial lung changes, however lymphangitic spread of malignancy cannot be excluded. Anterior compression deformities of several of the thoracic vertebral bodies, with heterogeneous appearance of T7 and T9 vertebral body. This may represent degenerative changes, however  osseous metastatic disease cannot be excluded.     07/11/2016 PET scan    There are 2 nodules in the left upper lobe and 2 nodules within the right lower lobe which have spiculated margin and exhibit intense radiotracer uptake compatible with multifocal pulmonary metastasis versus  multiple synchronous primary pulmonary neoplasms. 2. Emphysema 3. Aortic atherosclerosis and multi vessel coronary artery calcification.     Cancer of upper lobe of left lung ([place])   06/09/2016 Imaging    CT chest: Bilateral spiculated soft tissue pulmonary masses, highly suspicious for multifocal malignancy. Borderline enlarged left-sided mediastinal lymphadenopathy. Background of severe emphysematous changes, chronic cylindrical bronchiectasis and hyperinflation of the lungs. Findings consistent with longstanding COPD. Diffusely thickened interstitial markings. This may represent chronic interstitial lung changes, however lymphangitic spread of malignancy cannot be excluded. Anterior compression deformities of several of the thoracic vertebral bodies, with heterogeneous appearance of T7 and T9 vertebral body. This may represent degenerative changes, however osseous metastatic disease cannot be excluded.     06/29/2016 Imaging    MRI brain: No evidence of metastatic disease or recent infarction. Advanced generalized brain atrophy with moderate to marked chronic small-vessel ischemic changes throughout. FLAIR imaging appears to show scattered gyri showing FLAIR hyperintensity. These abnormalities are not confirmed with restricted diffusion or contrast enhancement. Therefore, we are not certain that this is not artifactual. The differential diagnosis does include true pathology such as posterior reversible encephalopathy, viral or prion disease, postictal change in post chemotherapy change. If there is concern about active CNS pathology, re- scanning in 4-6 weeks could be useful.    07/27/2016 Imaging    CT chest: Bilateral pulmonary lesions. These are suspicious for metastatic disease. Lesions have minimally changed but there may be slight enlargement of the cavitary lesion in the right lower lobe. Severe emphysematous changes.     08/10/2016 Pathology Results    1. Lung, biopsy, RUL, (apical  segment) - SCANT BENIGN LUNG PARENCHYMA. - THERE IS NO EVIDENCE OF MALIGNANCY. 2. Lung, biopsy, LUL lingula - SQUAMOUS CELL CARCINOMA. - SEE COMMENT. 3. Lung, biopsy, LUL - ADENOCARCINOMA. - SEE COMMENT. Microscopic Comment 2. The malignant cells are positive for cytokeratin 56 and p63. They are negative for TTF-1. The findings are consistent with squamous cell carcinoma. There is likely insufficient tissue remaining for additional studies, if requested. 3. The malignant cells are positive for TTF-1 and negative for p63 and cytokeratin 5/6. The findings are consistent with adenocarcinoma.    08/10/2016 Procedure    The targets were defined as follows: Left upper lobe nodule, target 1 Lingular nodule, target 2 Proximal right lower lobe nodule, target 3 More distal right lower lobe, nodule  The extendable working channel was secured into place and the locator guide was withdrawn. Under fluoroscopic guidance transbronchial needle brushings, transbronchial Wang needle biopsies, and transbronchial forceps biopsies were performed in the LUL (target 1) and the lingula (target 2) to be sent for cytology and pathology. A bronchioalveolar lavage was performed in the lingula in the vicinity of target 2  and sent for cytology and microbiology (bacterial, fungal, AFB smears and cultures).  Fiducial markers were then placed around target 1, target 2, and in the right lower lobe in the vicinity of both target 3 and target 4 to be used for possible radiation therapy should this be indicated. At the end of the procedure a general airway inspection was performed and there was no evidence of active bleeding. The bronchoscope was removed.  The patient tolerated the procedure  well. There was no significant blood loss and there were no obvious complications. A post-procedural chest x-ray is pending.  Samples: 1. Transbronchial needle brushings from targets 1 and 2 2. Transbronchial Wang needle biopsies from  targets 1 and 2 3. Transbronchial forceps biopsies from targets 1 and 2 4. Bronchoalveolar lavage from lingula, target 2 5. Endobronchial biopsies from RUL apical segment 6. Endobronchial brushings from right upper lobe apical segment     08/10/2016 Genetic Testing    Patient has genetic testing done for [employer] and PD-L1 testing Results revealed PD-L1 testing is negative 0%    08/31/2016 Procedure    Technically successful right IJ power-injectable port catheter placement. Ready for routine use    09/21/2016 - 06/08/2017 Chemotherapy    The patient had chemotherapy with carboplatin and Taxol.    11/30/2016 PET scan    1. Interval resection of the hypermetabolic lingular nodule. 2. The 3 other spiculated, hypermetabolic nodules within the left upper and right lower lobes are slightly smaller with decreased metabolic activity, consistent with some response to interval therapy. 3. No progressive disease    03/06/2017 PET scan    1. Mixed appearance, with significant improvement in the left upper lobe nodule ; stable appearance of the more cephalad right lower lobe nodule; but enlargement and increased in metabolic activity of the more inferior right lower lobe nodule which appears cavitary. No new nodule. 2. Previously there was some moderate increase in diffuse marrow activity. That has increased further, and could be a response from prior granulocyte/marrow stimulation, although I cannot completely exclude the possibility that this is related to the patient's myelodysplastic syndrome. No CT correlate. 3. Other imaging findings of potential clinical significance: Aortic Atherosclerosis (ICD10-I70.0) and Emphysema (ICD10-J43.9). Coronary atherosclerosis. Low-density blood pool suggests anemia. Chronic right upper lobe scarring. Mucus in both mainstem bronchi. Suspected left renal cysts. Indistinct but likely enlarged prostate gland.    04/06/2017 - 04/18/2017 Radiation Therapy    He  received SBRT treatment Radiation treatment dates:   04/06/17 - 04/18/17  Site/dose:   Right SBRT Lung// 50 Gy in 5 fx  Beams/energy:   Photon // SRBT/ SBT-3D     06/26/2017 PET scan    1. Clear interval response to therapy of the 2 right lower lobe pulmonary nodules, each decreased in both size and hypermetabolism. 2. Stable size and hypermetabolic activity in the left upper lobe nodule. 3. Diffusely decreased FDG accumulation within the marrow space. 4. Symmetric uptake in both parotid glands is likely physiologic. Given the symmetry, this is most likely physiologic or inflammatory. 5. Prostatomegaly with marked bladder distention. Component of bladder outlet obstruction suspected. 6. Emphysema. (ICD10-J43.9) 7. Aortic Atherosclerois (ICD10-170.0)    09/25/2017 PET scan    1. New and enlarging small hypermetabolic nodules in the lingula are compatible with metastatic disease. These are in proximity to the linear scarring fiducial markers. 2. New hypermetabolic subcarinal nodal metastasis. 3. Left upper lobe index nodule is stable to slightly progressed in the interval in shows mild increase in hypermetabolism. 4. No substantial change in the 2 index right lower lobe nodules showing only minimal FDG uptake, as before. 5. Interval progression of patchy airspace opacity in both dependent lower lungs, compatible with infection/inflammation.    10/12/2017 - 12/13/2017 Chemotherapy    The patient had atezolizumab for chemotherapy treatment.     01/02/2018 PET scan    2.8 cm left upper lobe nodule, increased, corresponding to known primary bronchogenic neoplasm.  Two lingular nodules measuring  up to 11 mm, mildly increased, suspicious for metastases.  12 mm short axis subcarinal node, worrisome for nodal metastasis, grossly unchanged.  Patchy right lower lobe opacity, suspicious for pneumonia. Mild left basilar opacity, improved.     REVIEW OF SYSTEMS:   Constitutional: Denies fevers,  chills or abnormal weight loss Eyes: Denies blurriness of vision Ears, nose, mouth, throat, and face: Denies mucositis or sore throat Cardiovascular: Denies palpitation, chest discomfort or lower extremity swelling Gastrointestinal:  Denies nausea, heartburn or change in bowel habits Skin: Denies abnormal skin rashes Lymphatics: Denies new lymphadenopathy or easy bruising Neurological:Denies numbness, tingling or new weaknesses Behavioral/Psych: Mood is stable, no new changes  All other systems were reviewed with the patient and are negative.  I have reviewed the past medical history, past surgical history, social history and family history with the patient and they are unchanged from previous note.  ALLERGIES:  is allergic to no known allergies.  MEDICATIONS:  Current Outpatient Medications  Medication Sig Dispense Refill  . albuterol (PROVENTIL HFA;VENTOLIN HFA) 108 (90 Base) MCG/ACT inhaler Inhale 2 puffs into the lungs every 4 (four) hours as needed for wheezing or shortness of breath. 1 Inhaler 5  . ferrous sulfate 325 (65 FE) MG tablet Take 325 mg by mouth daily with breakfast.    . folic acid (FOLVITE) 1 MG tablet Take 1 mg by mouth daily.    . Glycopyrrolate-Formoterol (BEVESPI AEROSPHERE) 9-4.8 MCG/ACT AERO Inhale 2 puffs into the lungs 2 (two) times daily. 2 Inhaler 0  . levETIRAcetam (KEPPRA) 500 MG tablet Take 1 tablet (500 mg total) by mouth 2 (two) times daily. 180 tablet 3  . mirtazapine (REMERON) 7.5 MG tablet Take 1 tablet (7.5 mg total) by mouth at bedtime. 30 tablet 2  . predniSONE (DELTASONE) 20 MG tablet Take 1 tablet (20 mg total) by mouth daily with breakfast. (Patient taking differently: Take 10 mg by mouth daily with breakfast. ) 30 tablet 11  . vitamin E 1000 UNIT capsule Take by mouth.     No current facility-administered medications for this visit.     PHYSICAL EXAMINATION: ECOG PERFORMANCE STATUS: 2 - Symptomatic, <50% confined to bed  Vitals:   05/23/18  1114  BP: 98/63  Pulse: 86  Resp: 18  Temp: 98.2 F (36.8 C)  SpO2: 96%   Filed Weights   05/23/18 1114  Weight: 96 lb 6.4 oz (43.7 kg)    GENERAL:alert, no distress and comfortable SKIN: skin color, texture, turgor are normal, no rashes or significant lesions EYES: normal, Conjunctiva are pink and non-injected, sclera clear OROPHARYNX:no exudate, no erythema and lips, buccal mucosa, and tongue normal  NECK: supple, thyroid normal size, non-tender, without nodularity LYMPH:  no palpable lymphadenopathy in the cervical, axillary or inguinal LUNGS: clear to auscultation and percussion with normal breathing effort HEART: regular rate & rhythm and no murmurs and no lower extremity edema ABDOMEN:abdomen soft, non-tender and normal bowel sounds Musculoskeletal:no cyanosis of digits with digital clubbing NEURO: alert & oriented x 3 with fluent speech, no focal motor/sensory deficits  LABORATORY DATA:  I have reviewed the data as listed    Component Value Date/Time   NA 145 05/04/2018 1241   NA 142 05/03/2017 1114   K 5.1 05/04/2018 1241   K 4.5 05/03/2017 1114   CL 114 (H) 05/04/2018 1241   CL 107 09/19/2012 1325   CO2 24 05/04/2018 1241   CO2 21 (L) 05/03/2017 1114   GLUCOSE 75 05/04/2018 1241   GLUCOSE 88  05/03/2017 1114   GLUCOSE 97 09/19/2012 1325   BUN 49 (H) 05/04/2018 1241   BUN 18.5 05/03/2017 1114   CREATININE 1.06 05/04/2018 1241   CREATININE 0.9 05/03/2017 1114   CALCIUM 8.8 (L) 05/04/2018 1241   CALCIUM 8.9 05/03/2017 1114   PROT 8.2 (H) 05/04/2018 1241   PROT 8.6 (H) 05/03/2017 1114   ALBUMIN 2.7 (L) 05/04/2018 1241   ALBUMIN 3.2 (L) 05/03/2017 1114   AST 15 05/04/2018 1241   AST 9 05/03/2017 1114   ALT 13 05/04/2018 1241   ALT <6 05/03/2017 1114   ALKPHOS 84 05/04/2018 1241   ALKPHOS 126 05/03/2017 1114   BILITOT 0.3 05/04/2018 1241   BILITOT 0.34 05/03/2017 1114   GFRNONAA >60 05/04/2018 1241   GFRAA >60 05/04/2018 1241    No results found for:  SPEP, UPEP  Lab Results  Component Value Date   WBC 6.1 05/16/2018   NEUTROABS 5.1 05/16/2018   HGB 8.8 (L) 05/16/2018   HCT 28.6 (L) 05/16/2018   MCV 123.8 (H) 05/16/2018   PLT 198 05/16/2018      Chemistry      Component Value Date/Time   NA 145 05/04/2018 1241   NA 142 05/03/2017 1114   K 5.1 05/04/2018 1241   K 4.5 05/03/2017 1114   CL 114 (H) 05/04/2018 1241   CL 107 09/19/2012 1325   CO2 24 05/04/2018 1241   CO2 21 (L) 05/03/2017 1114   BUN 49 (H) 05/04/2018 1241   BUN 18.5 05/03/2017 1114   CREATININE 1.06 05/04/2018 1241   CREATININE 0.9 05/03/2017 1114      Component Value Date/Time   CALCIUM 8.8 (L) 05/04/2018 1241   CALCIUM 8.9 05/03/2017 1114   ALKPHOS 84 05/04/2018 1241   ALKPHOS 126 05/03/2017 1114   AST 15 05/04/2018 1241   AST 9 05/03/2017 1114   ALT 13 05/04/2018 1241   ALT <6 05/03/2017 1114   BILITOT 0.3 05/04/2018 1241   BILITOT 0.34 05/03/2017 1114       RADIOGRAPHIC STUDIES: I have personally reviewed the radiological images as listed and agreed with the findings in the report. Dg Chest 2 View  Result Date: 05/23/2018 CLINICAL DATA:  Cough and shortness of breath. History of lung carcinoma EXAM: CHEST - 2 VIEW COMPARISON:  PET-CT January 02, 2018 and chest radiograph August 10, 2016 FINDINGS: Port-A-Cath tip is at the cavoatrial junction. No pneumothorax. There is increased opacity in the region of known previous left upper lobe mass. It is difficult at this time to ascertain whether there is residual mass in this area versus scarring. There is no new opacity in this area. Elsewhere, there are scattered areas of scarring. No new masslike area evident. No frank edema or consolidation. Heart size and pulmonary vascularity are normal. No adenopathy is appreciable. There are wedge fractures in the midthoracic region. No frank blastic or lytic bone lesions. IMPRESSION: Opacity in the left upper lobe at the site of previous mass is evident. Question  residual mass in this area versus scarring. There may be both scarring and mass in this area currently. This area measures approximately 1.5 x 0.8 cm. No edema or consolidation. Scattered areas of lung scarring. No adenopathy appreciable by radiography. Stable cardiac silhouette. Wedging of several midthoracic vertebral bodies noted. Electronically Signed   By: Lowella Grip III M.D.   On: 05/23/2018 10:58    All questions were answered. The patient knows to call the clinic with any problems, questions or concerns. No  barriers to learning was detected.  I spent 15 minutes counseling the patient face to face. The total time spent in the appointment was 20 minutes and more than 50% was on counseling and review of test results  [name], MD 05/23/2018 12:07 PM

## 2018-05-23 NOTE — Assessment & Plan Note (Signed)
He has malignant cachexia and have difficulties with oral fluid intake We will consult advanced home care for IV fluids at home every other day, a liter as needed to help prevent dehydration

## 2018-05-23 NOTE — Assessment & Plan Note (Signed)
The patient has mild worsening shortness of breath recently I have reviewed the chest x-ray myself that did not reveal significant cancer progression There are no lobar consolidation to suggest pneumonia Examination today is benign without wheezes His oxygen saturation is within normal limits I do not recommend any further work-up and recommend continue low-dose prednisone therapy for COPD

## 2018-05-23 NOTE — Assessment & Plan Note (Signed)
His recent blood work is satisfactory and he did not need blood transfusion  He will continue close follow-up with repeat blood work and darbepoetin injection or transfusion support as needed

## 2018-05-24 ENCOUNTER — Inpatient Hospital Stay: Payer: Self-pay | Admitting: Hematology and Oncology

## 2018-05-24 DIAGNOSIS — C3412 Malignant neoplasm of upper lobe, left bronchus or lung: Secondary | ICD-10-CM | POA: Diagnosis not present

## 2018-05-24 DIAGNOSIS — R531 Weakness: Secondary | ICD-10-CM | POA: Diagnosis not present

## 2018-05-24 DIAGNOSIS — D4620 Refractory anemia with excess of blasts, unspecified: Secondary | ICD-10-CM | POA: Diagnosis not present

## 2018-05-29 ENCOUNTER — Encounter (HOSPITAL_COMMUNITY): Payer: Self-pay

## 2018-05-29 ENCOUNTER — Inpatient Hospital Stay (HOSPITAL_COMMUNITY)
Admission: EM | Admit: 2018-05-29 | Discharge: 2018-07-08 | DRG: 871 | Disposition: E | Payer: Medicare Other | Attending: Internal Medicine | Admitting: Internal Medicine

## 2018-05-29 ENCOUNTER — Other Ambulatory Visit: Payer: Self-pay

## 2018-05-29 ENCOUNTER — Telehealth: Payer: Self-pay

## 2018-05-29 ENCOUNTER — Emergency Department (HOSPITAL_COMMUNITY): Payer: Medicare Other

## 2018-05-29 ENCOUNTER — Inpatient Hospital Stay (HOSPITAL_COMMUNITY): Payer: Medicare Other

## 2018-05-29 DIAGNOSIS — R000 Tachycardia, unspecified: Secondary | ICD-10-CM | POA: Diagnosis not present

## 2018-05-29 DIAGNOSIS — Z79899 Other long term (current) drug therapy: Secondary | ICD-10-CM

## 2018-05-29 DIAGNOSIS — E162 Hypoglycemia, unspecified: Secondary | ICD-10-CM | POA: Diagnosis not present

## 2018-05-29 DIAGNOSIS — G40909 Epilepsy, unspecified, not intractable, without status epilepticus: Secondary | ICD-10-CM | POA: Diagnosis present

## 2018-05-29 DIAGNOSIS — J690 Pneumonitis due to inhalation of food and vomit: Secondary | ICD-10-CM | POA: Diagnosis present

## 2018-05-29 DIAGNOSIS — J181 Lobar pneumonia, unspecified organism: Secondary | ICD-10-CM

## 2018-05-29 DIAGNOSIS — E873 Alkalosis: Secondary | ICD-10-CM | POA: Diagnosis present

## 2018-05-29 DIAGNOSIS — D649 Anemia, unspecified: Secondary | ICD-10-CM | POA: Diagnosis present

## 2018-05-29 DIAGNOSIS — J441 Chronic obstructive pulmonary disease with (acute) exacerbation: Secondary | ICD-10-CM | POA: Diagnosis present

## 2018-05-29 DIAGNOSIS — R627 Adult failure to thrive: Secondary | ICD-10-CM | POA: Diagnosis not present

## 2018-05-29 DIAGNOSIS — D630 Anemia in neoplastic disease: Secondary | ICD-10-CM | POA: Diagnosis present

## 2018-05-29 DIAGNOSIS — I361 Nonrheumatic tricuspid (valve) insufficiency: Secondary | ICD-10-CM | POA: Diagnosis not present

## 2018-05-29 DIAGNOSIS — Z681 Body mass index (BMI) 19 or less, adult: Secondary | ICD-10-CM

## 2018-05-29 DIAGNOSIS — A4189 Other specified sepsis: Principal | ICD-10-CM | POA: Diagnosis present

## 2018-05-29 DIAGNOSIS — G3184 Mild cognitive impairment, so stated: Secondary | ICD-10-CM | POA: Diagnosis present

## 2018-05-29 DIAGNOSIS — J189 Pneumonia, unspecified organism: Secondary | ICD-10-CM

## 2018-05-29 DIAGNOSIS — R1310 Dysphagia, unspecified: Secondary | ICD-10-CM

## 2018-05-29 DIAGNOSIS — L8990 Pressure ulcer of unspecified site, unspecified stage: Secondary | ICD-10-CM

## 2018-05-29 DIAGNOSIS — I959 Hypotension, unspecified: Secondary | ICD-10-CM | POA: Diagnosis not present

## 2018-05-29 DIAGNOSIS — Z9221 Personal history of antineoplastic chemotherapy: Secondary | ICD-10-CM | POA: Diagnosis not present

## 2018-05-29 DIAGNOSIS — Z85118 Personal history of other malignant neoplasm of bronchus and lung: Secondary | ICD-10-CM

## 2018-05-29 DIAGNOSIS — N179 Acute kidney failure, unspecified: Secondary | ICD-10-CM | POA: Diagnosis present

## 2018-05-29 DIAGNOSIS — D469 Myelodysplastic syndrome, unspecified: Secondary | ICD-10-CM | POA: Diagnosis not present

## 2018-05-29 DIAGNOSIS — J9601 Acute respiratory failure with hypoxia: Secondary | ICD-10-CM | POA: Diagnosis present

## 2018-05-29 DIAGNOSIS — R339 Retention of urine, unspecified: Secondary | ICD-10-CM | POA: Diagnosis not present

## 2018-05-29 DIAGNOSIS — A419 Sepsis, unspecified organism: Secondary | ICD-10-CM

## 2018-05-29 DIAGNOSIS — R64 Cachexia: Secondary | ICD-10-CM | POA: Diagnosis not present

## 2018-05-29 DIAGNOSIS — B964 Proteus (mirabilis) (morganii) as the cause of diseases classified elsewhere: Secondary | ICD-10-CM | POA: Diagnosis present

## 2018-05-29 DIAGNOSIS — J449 Chronic obstructive pulmonary disease, unspecified: Secondary | ICD-10-CM | POA: Diagnosis present

## 2018-05-29 DIAGNOSIS — D61818 Other pancytopenia: Secondary | ICD-10-CM

## 2018-05-29 DIAGNOSIS — E43 Unspecified severe protein-calorie malnutrition: Secondary | ICD-10-CM | POA: Diagnosis present

## 2018-05-29 DIAGNOSIS — Z515 Encounter for palliative care: Secondary | ICD-10-CM | POA: Diagnosis not present

## 2018-05-29 DIAGNOSIS — R509 Fever, unspecified: Secondary | ICD-10-CM

## 2018-05-29 DIAGNOSIS — D4620 Refractory anemia with excess of blasts, unspecified: Secondary | ICD-10-CM | POA: Diagnosis not present

## 2018-05-29 DIAGNOSIS — Z8589 Personal history of malignant neoplasm of other organs and systems: Secondary | ICD-10-CM

## 2018-05-29 DIAGNOSIS — R0602 Shortness of breath: Secondary | ICD-10-CM | POA: Diagnosis not present

## 2018-05-29 DIAGNOSIS — R6520 Severe sepsis without septic shock: Secondary | ICD-10-CM | POA: Diagnosis present

## 2018-05-29 DIAGNOSIS — D638 Anemia in other chronic diseases classified elsewhere: Secondary | ICD-10-CM | POA: Diagnosis present

## 2018-05-29 DIAGNOSIS — N390 Urinary tract infection, site not specified: Secondary | ICD-10-CM | POA: Diagnosis present

## 2018-05-29 DIAGNOSIS — C321 Malignant neoplasm of supraglottis: Secondary | ICD-10-CM | POA: Diagnosis present

## 2018-05-29 DIAGNOSIS — Z66 Do not resuscitate: Secondary | ICD-10-CM | POA: Diagnosis present

## 2018-05-29 DIAGNOSIS — N3001 Acute cystitis with hematuria: Secondary | ICD-10-CM | POA: Diagnosis not present

## 2018-05-29 DIAGNOSIS — E875 Hyperkalemia: Secondary | ICD-10-CM | POA: Diagnosis present

## 2018-05-29 DIAGNOSIS — L89151 Pressure ulcer of sacral region, stage 1: Secondary | ICD-10-CM | POA: Diagnosis present

## 2018-05-29 DIAGNOSIS — M069 Rheumatoid arthritis, unspecified: Secondary | ICD-10-CM | POA: Diagnosis present

## 2018-05-29 DIAGNOSIS — Z923 Personal history of irradiation: Secondary | ICD-10-CM

## 2018-05-29 DIAGNOSIS — I5042 Chronic combined systolic (congestive) and diastolic (congestive) heart failure: Secondary | ICD-10-CM | POA: Diagnosis present

## 2018-05-29 DIAGNOSIS — R0902 Hypoxemia: Secondary | ICD-10-CM | POA: Diagnosis not present

## 2018-05-29 DIAGNOSIS — Z7952 Long term (current) use of systemic steroids: Secondary | ICD-10-CM

## 2018-05-29 DIAGNOSIS — Z9981 Dependence on supplemental oxygen: Secondary | ICD-10-CM | POA: Diagnosis not present

## 2018-05-29 DIAGNOSIS — R0689 Other abnormalities of breathing: Secondary | ICD-10-CM | POA: Diagnosis not present

## 2018-05-29 DIAGNOSIS — I351 Nonrheumatic aortic (valve) insufficiency: Secondary | ICD-10-CM | POA: Diagnosis not present

## 2018-05-29 DIAGNOSIS — C3412 Malignant neoplasm of upper lobe, left bronchus or lung: Secondary | ICD-10-CM | POA: Diagnosis not present

## 2018-05-29 DIAGNOSIS — J168 Pneumonia due to other specified infectious organisms: Secondary | ICD-10-CM | POA: Diagnosis not present

## 2018-05-29 DIAGNOSIS — Z8521 Personal history of malignant neoplasm of larynx: Secondary | ICD-10-CM

## 2018-05-29 DIAGNOSIS — E860 Dehydration: Secondary | ICD-10-CM | POA: Diagnosis present

## 2018-05-29 DIAGNOSIS — F329 Major depressive disorder, single episode, unspecified: Secondary | ICD-10-CM | POA: Diagnosis present

## 2018-05-29 DIAGNOSIS — Z87891 Personal history of nicotine dependence: Secondary | ICD-10-CM

## 2018-05-29 LAB — URINALYSIS, ROUTINE W REFLEX MICROSCOPIC
Bilirubin Urine: NEGATIVE
Glucose, UA: NEGATIVE mg/dL
Ketones, ur: NEGATIVE mg/dL
Nitrite: NEGATIVE
Protein, ur: >=300 mg/dL — AB
RBC / HPF: >50 RBC/hpf — ABNORMAL HIGH (ref 0–5)
Specific Gravity, Urine: 1.018 (ref 1.005–1.030)
pH: 8 (ref 5.0–8.0)

## 2018-05-29 LAB — LACTIC ACID, PLASMA
Lactic Acid, Venous: 4.5 mmol/L (ref 0.5–1.9)
Lactic Acid, Venous: 3 mmol/L (ref 0.5–1.9)
Lactic Acid, Venous: 2.7 mmol/L (ref 0.5–1.9)

## 2018-05-29 LAB — CBC WITH DIFFERENTIAL/PLATELET
Abs Immature Granulocytes: 0.71 10*3/uL — ABNORMAL HIGH (ref 0.00–0.07)
BASOS ABS: 0.1 10*3/uL (ref 0.0–0.1)
Basophils Relative: 0 %
Eosinophils Absolute: 0 10*3/uL (ref 0.0–0.5)
Eosinophils Relative: 0 %
HCT: 26.7 % — ABNORMAL LOW (ref 39.0–52.0)
Hemoglobin: 8.2 g/dL — ABNORMAL LOW (ref 13.0–17.0)
Immature Granulocytes: 2 %
Lymphocytes Relative: 0 %
Lymphs Abs: 0.1 10*3/uL — ABNORMAL LOW (ref 0.7–4.0)
MCH: 40.2 pg — ABNORMAL HIGH (ref 26.0–34.0)
MCHC: 30.7 g/dL (ref 30.0–36.0)
MCV: 130.9 fL — ABNORMAL HIGH (ref 80.0–100.0)
Monocytes Absolute: 0.9 10*3/uL (ref 0.1–1.0)
Monocytes Relative: 3 %
NEUTROS ABS: 31.2 10*3/uL — AB (ref 1.7–7.7)
Neutrophils Relative %: 95 %
PLATELETS: 292 10*3/uL (ref 150–400)
RBC: 2.04 MIL/uL — AB (ref 4.22–5.81)
RDW: 21.3 % — ABNORMAL HIGH (ref 11.5–15.5)
WBC: 33 10*3/uL — AB (ref 4.0–10.5)
nRBC: 0.1 % (ref 0.0–0.2)

## 2018-05-29 LAB — CBC
HCT: 27.1 % — ABNORMAL LOW (ref 39.0–52.0)
Hemoglobin: 8.1 g/dL — ABNORMAL LOW (ref 13.0–17.0)
MCH: 39.9 pg — ABNORMAL HIGH (ref 26.0–34.0)
MCHC: 29.9 g/dL — ABNORMAL LOW (ref 30.0–36.0)
MCV: 133.5 fL — ABNORMAL HIGH (ref 80.0–100.0)
Platelets: 257 10*3/uL (ref 150–400)
RBC: 2.03 MIL/uL — AB (ref 4.22–5.81)
RDW: 21.5 % — ABNORMAL HIGH (ref 11.5–15.5)
WBC: 44.7 10*3/uL — ABNORMAL HIGH (ref 4.0–10.5)
nRBC: 0 % (ref 0.0–0.2)

## 2018-05-29 LAB — CREATININE, SERUM
Creatinine, Ser: 1.28 mg/dL — ABNORMAL HIGH (ref 0.61–1.24)
GFR calc Af Amer: >60 mL/min (ref >60)
GFR, EST NON AFRICAN AMERICAN: 57 mL/min — AB (ref >60)

## 2018-05-29 LAB — BLOOD GAS, ARTERIAL
Acid-base deficit: 3.1 mmol/L — ABNORMAL HIGH (ref 0.0–2.0)
Bicarbonate: 18.2 mmol/L — ABNORMAL LOW (ref 20.0–28.0)
DRAWN BY: 11249
O2 Content: 3 L/min
O2 Saturation: 95.3 %
Patient temperature: 101.3
pCO2 arterial: 24.1 mmHg — ABNORMAL LOW (ref 32.0–48.0)
pH, Arterial: 7.498 — ABNORMAL HIGH (ref 7.350–7.450)
pO2, Arterial: 87.4 mmHg (ref 83.0–108.0)

## 2018-05-29 LAB — COMPREHENSIVE METABOLIC PANEL
ALT: 23 U/L (ref 0–44)
AST: 35 U/L (ref 15–41)
Albumin: 2.8 g/dL — ABNORMAL LOW (ref 3.5–5.0)
Alkaline Phosphatase: 80 U/L (ref 38–126)
Anion gap: 12 (ref 5–15)
BUN: 47 mg/dL — ABNORMAL HIGH (ref 8–23)
CO2: 19 mmol/L — ABNORMAL LOW (ref 22–32)
Calcium: 8.2 mg/dL — ABNORMAL LOW (ref 8.9–10.3)
Chloride: 110 mmol/L (ref 98–111)
Creatinine, Ser: 1.47 mg/dL — ABNORMAL HIGH (ref 0.61–1.24)
GFR calc Af Amer: 56 mL/min — ABNORMAL LOW (ref >60)
GFR calc non Af Amer: 48 mL/min — ABNORMAL LOW (ref >60)
Glucose, Bld: 98 mg/dL (ref 70–99)
Potassium: 5.3 mmol/L — ABNORMAL HIGH (ref 3.5–5.1)
Sodium: 141 mmol/L (ref 135–145)
Total Bilirubin: 0.7 mg/dL (ref 0.3–1.2)
Total Protein: 7.7 g/dL (ref 6.5–8.1)

## 2018-05-29 LAB — I-STAT CG4 LACTIC ACID, ED: LACTIC ACID, VENOUS: 2.83 mmol/L — AB (ref 0.5–1.9)

## 2018-05-29 LAB — INFLUENZA PANEL BY PCR (TYPE A & B)
Influenza A By PCR: NEGATIVE
Influenza B By PCR: NEGATIVE

## 2018-05-29 LAB — I-STAT TROPONIN, ED: Troponin i, poc: 0.01 ng/mL (ref 0.00–0.08)

## 2018-05-29 LAB — MRSA PCR SCREENING: MRSA by PCR: NEGATIVE

## 2018-05-29 LAB — STREP PNEUMONIAE URINARY ANTIGEN: Strep Pneumo Urinary Antigen: NEGATIVE

## 2018-05-29 MED ORDER — SODIUM CHLORIDE 0.9% FLUSH
10.0000 mL | Freq: Two times a day (BID) | INTRAVENOUS | Status: DC
  Administered 2018-05-29 – 2018-06-08 (×16): 10 mL

## 2018-05-29 MED ORDER — FOLIC ACID 1 MG PO TABS
1.0000 mg | ORAL_TABLET | Freq: Every day | ORAL | Status: DC
  Administered 2018-05-29 – 2018-06-05 (×7): 1 mg via ORAL
  Filled 2018-05-29 (×8): qty 1

## 2018-05-29 MED ORDER — HEPARIN SODIUM (PORCINE) 5000 UNIT/ML IJ SOLN
5000.0000 [IU] | Freq: Three times a day (TID) | INTRAMUSCULAR | Status: DC
  Administered 2018-05-29 – 2018-06-09 (×25): 5000 [IU] via SUBCUTANEOUS
  Filled 2018-05-29 (×27): qty 1

## 2018-05-29 MED ORDER — SODIUM CHLORIDE 0.9 % IV BOLUS: 500.0000 mL | Freq: Once | INTRAVENOUS | Status: DC

## 2018-05-29 MED ORDER — SODIUM CHLORIDE 0.9 % IV SOLN
2.0000 g | Freq: Once | INTRAVENOUS | Status: AC
  Administered 2018-05-29: 2 g via INTRAVENOUS
  Filled 2018-05-29: qty 2

## 2018-05-29 MED ORDER — SODIUM CHLORIDE 0.9 % IV SOLN
1.0000 g | Freq: Two times a day (BID) | INTRAVENOUS | Status: DC
  Administered 2018-05-30 – 2018-06-02 (×7): 1 g via INTRAVENOUS
  Filled 2018-05-29 (×9): qty 1

## 2018-05-29 MED ORDER — ORAL CARE MOUTH RINSE
15.0000 mL | Freq: Two times a day (BID) | OROMUCOSAL | Status: DC
  Administered 2018-05-29: 15 mL via OROMUCOSAL

## 2018-05-29 MED ORDER — ADULT MULTIVITAMIN LIQUID CH
15.0000 mL | Freq: Every day | ORAL | Status: DC
  Administered 2018-05-29 – 2018-06-05 (×6): 15 mL via ORAL
  Filled 2018-05-29 (×12): qty 15

## 2018-05-29 MED ORDER — ENSURE ENLIVE PO LIQD
237.0000 mL | Freq: Two times a day (BID) | ORAL | Status: DC
  Administered 2018-05-29 – 2018-06-07 (×7): 237 mL via ORAL

## 2018-05-29 MED ORDER — SODIUM CHLORIDE 0.9 % IV BOLUS (SEPSIS)
1000.0000 mL | Freq: Once | INTRAVENOUS | Status: AC
  Administered 2018-05-29: 1000 mL via INTRAVENOUS

## 2018-05-29 MED ORDER — METHYLPREDNISOLONE SODIUM SUCC 125 MG IJ SOLR
60.0000 mg | Freq: Four times a day (QID) | INTRAMUSCULAR | Status: DC
  Administered 2018-05-29 – 2018-05-30 (×7): 60 mg via INTRAVENOUS
  Filled 2018-05-29 (×7): qty 2

## 2018-05-29 MED ORDER — ACETAMINOPHEN 500 MG PO TABS
1000.0000 mg | ORAL_TABLET | Freq: Once | ORAL | Status: AC
  Administered 2018-05-29: 1000 mg via ORAL
  Filled 2018-05-29: qty 2

## 2018-05-29 MED ORDER — VANCOMYCIN HCL IN DEXTROSE 1-5 GM/200ML-% IV SOLN
1000.0000 mg | Freq: Once | INTRAVENOUS | Status: AC
  Administered 2018-05-29: 1000 mg via INTRAVENOUS
  Filled 2018-05-29: qty 200

## 2018-05-29 MED ORDER — SODIUM CHLORIDE 0.9 % IV BOLUS
500.0000 mL | Freq: Once | INTRAVENOUS | Status: AC
  Administered 2018-05-29: 500 mL via INTRAVENOUS

## 2018-05-29 MED ORDER — LEVETIRACETAM 500 MG PO TABS
500.0000 mg | ORAL_TABLET | Freq: Two times a day (BID) | ORAL | Status: DC
  Administered 2018-05-29 – 2018-06-08 (×15): 500 mg via ORAL
  Filled 2018-05-29 (×16): qty 1

## 2018-05-29 MED ORDER — MIRTAZAPINE 15 MG PO TABS
7.5000 mg | ORAL_TABLET | Freq: Every day | ORAL | Status: DC
  Administered 2018-05-29 – 2018-06-08 (×9): 7.5 mg via ORAL
  Filled 2018-05-29 (×9): qty 1

## 2018-05-29 MED ORDER — CHLORHEXIDINE GLUCONATE CLOTH 2 % EX PADS
6.0000 | MEDICATED_PAD | Freq: Every day | CUTANEOUS | Status: DC
  Administered 2018-05-30 – 2018-06-07 (×7): 6 via TOPICAL

## 2018-05-29 MED ORDER — METRONIDAZOLE IN NACL 5-0.79 MG/ML-% IV SOLN
500.0000 mg | Freq: Three times a day (TID) | INTRAVENOUS | Status: DC
  Administered 2018-05-29 – 2018-06-02 (×12): 500 mg via INTRAVENOUS
  Filled 2018-05-29 (×12): qty 100

## 2018-05-29 MED ORDER — SODIUM CHLORIDE 0.9 % IV BOLUS (SEPSIS)
500.0000 mL | Freq: Once | INTRAVENOUS | Status: AC
  Administered 2018-05-29: 500 mL via INTRAVENOUS

## 2018-05-29 MED ORDER — SODIUM CHLORIDE 0.9 % IV SOLN
INTRAVENOUS | Status: DC
  Administered 2018-05-29: 05:00:00 via INTRAVENOUS

## 2018-05-29 MED ORDER — SODIUM CHLORIDE 0.9% FLUSH: 10.0000 mL | INTRAVENOUS | Status: DC | PRN

## 2018-05-29 MED ORDER — PREDNISONE 20 MG PO TABS: 20.0000 mg | ORAL_TABLET | Freq: Every day | ORAL | Status: DC

## 2018-05-29 MED ORDER — SODIUM CHLORIDE 0.9 % IV SOLN
INTRAVENOUS | Status: DC
  Administered 2018-05-29 (×2): via INTRAVENOUS

## 2018-05-29 MED ORDER — VANCOMYCIN HCL IN DEXTROSE 750-5 MG/150ML-% IV SOLN
750.0000 mg | INTRAVENOUS | Status: DC
  Filled 2018-05-29: qty 150

## 2018-05-29 MED ORDER — VITAMIN E 45 MG (100 UNIT) PO CAPS
1000.0000 [IU] | ORAL_CAPSULE | Freq: Every day | ORAL | Status: DC
  Administered 2018-05-31 – 2018-06-05 (×4): 1000 [IU] via ORAL
  Filled 2018-05-29 (×12): qty 2

## 2018-05-29 MED ORDER — ACETAMINOPHEN 650 MG RE SUPP
650.0000 mg | Freq: Once | RECTAL | Status: AC
  Administered 2018-05-29: 650 mg via RECTAL
  Filled 2018-05-29: qty 1

## 2018-05-29 MED ORDER — FERROUS SULFATE 325 (65 FE) MG PO TABS
325.0000 mg | ORAL_TABLET | Freq: Every day | ORAL | Status: DC
  Administered 2018-05-29 – 2018-06-05 (×7): 325 mg via ORAL
  Filled 2018-05-29 (×10): qty 1

## 2018-05-29 MED ORDER — IPRATROPIUM-ALBUTEROL 0.5-2.5 (3) MG/3ML IN SOLN: 3.0000 mL | RESPIRATORY_TRACT | Status: DC | PRN

## 2018-05-29 NOTE — Progress Notes (Signed)
A consult was received from an ED physician for cefepime and vancomycin per pharmacy dosing.  The patient's profile has been reviewed for ht/wt/allergies/indication/available labs.   A one time order has been placed for Cefepime 2 Gm and Vancomycin 1 Gm.  Further antibiotics/pharmacy consults should be ordered by admitting physician if indicated.                       Thank you, Dorrene German 05/22/2018  5:26 AM

## 2018-05-29 NOTE — ED Notes (Signed)
Report given to Jeff, RN

## 2018-05-29 NOTE — Evaluation (Signed)
Clinical/Bedside Swallow Evaluation Patient Details  Name: Adrian [name] MRN: [id number] Date of Birth: Aug 10, 1950  Today's Date: 05/09/2018 Time: SLP Start Time (ACUTE ONLY): 4098 SLP Stop Time (ACUTE ONLY): 0855 SLP Time Calculation (min) (ACUTE ONLY): 16 min  Past Medical History:  Past Medical History:  Diagnosis Date  . Anxiety   . Arthritis    rheumatoid  . Cancer ([place]) 03/11/2013   larnyx/epiglottic Squamous cell in situ  . Emphysema of lung ([place])   . Esophageal reflux    not current  . History of blood transfusion   . History of radiation therapy 05/13/2013-06/28/2013   68 gray to epiglottis/neck  . History of radiation therapy 04/06/17-04/18/17   right SBRT lung 50 Gy in 5 fractions  . Megaloblastic anemia   . Pneumonia 01/2013  . Seizures ([place])    last one was 8 years ago- 2008  . Shortness of breath    with exertion  . Throat pain 06/13/2013   Past Surgical History:  Past Surgical History:  Procedure Laterality Date  . ABDOMINAL HERNIA REPAIR    . COLONOSCOPY    . IR FLUORO GUIDE PORT INSERTION RIGHT  08/31/2016  . IR US GUIDE VASC ACCESS RIGHT  08/31/2016  . MINOR PLACEMENT OF FIDUCIAL  08/10/2016   Procedure: MINOR PLACEMENT OF FIDUCIAL x3;  Surgeon: Collene Gobble, MD;  Location: [place];  Service: Thoracic;;  . MULTIPLE EXTRACTIONS WITH ALVEOLOPLASTY N/A 04/16/2013   Procedure: Extraction of tooth #'s 2,5,6,7,10,11,15,17,21,22,27,29 with alveoloplasty, bilateral maxillary fibrous tuberosity reductions, removal of excess tissues of mandibular arch, and mandibular left lingual exostoses reductions.;  Surgeon: Lenn Cal, DDS;  Location: [place];  Service: Oral Surgery;  Laterality: N/A;  . PEG PLACEMENT  2014  . tracheostomy  2014  . VIDEO BRONCHOSCOPY WITH ENDOBRONCHIAL NAVIGATION N/A 08/10/2016   Procedure: VIDEO BRONCHOSCOPY WITH ENDOBRONCHIAL NAVIGATION;  Surgeon: Collene Gobble, MD;  Location: [facility];  Service: Thoracic;  Laterality: N/A;   HPI:  68 yo male  adm to [facility] with AMS and fever, due to possible UTI and findings concerning for pna *right middle lobe per imaging study.  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  Pt has previously had a PEG and a bronch 08/10/2016.  He and wife deny pt seeing an SLP for dysphagia management with his pharyngeal cancer.  Also PMH with + seizure, myleodysplastic syndrome.  Cachexia per oncologist note with decreased ability to consume po.  Swallow evaluation ordered.  Pt has lost weight but has not had recent pna until now per his wife.  Wife reports pt having pill dysphagia and some difficulties with meats.     Assessment / Plan / Recommendation Clinical Impression  Pt presents with clinical indications of aspiration with po intake.  Note s/p radiation and ? Surgical intervention.  MBS indicated to determine if strategies may assist to mitigate aspiration.  Encourage pt to "hock" and cough to clear if indicated and to rest if short of breath or excessively coughing.  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  Advised pt and family to other risk factors for aspiration pnas despite dysphagia and pt presentation with several of them at this time.  Note he has delayed responses today if he responds appropriately.  Pt, wife and daughter agreeable to [facility] plan to mitigate aspiration only - do not anticipate prevention  possible.  Suspect palliative referral may be indicated to help address chronic medical issues and pt's wishes.   SLP Visit Diagnosis: Dysphagia, pharyngeal phase (R13.13)    Aspiration Risk  Moderate aspiration risk    Diet Recommendation Dysphagia 3 (Mech soft);Thin liquid(pending mbs)   Liquid Administration via: Cup;Straw Supervision: Patient able to self feed Compensations: Multiple dry swallows after each bite/sip;Other (Comment)(cough and expectorate prn, stop  po if coughing excessively) Postural Changes: Seated upright at 90 degrees;Remain upright for at least 30 minutes after po intake    Other  Recommendations Oral Care Recommendations: Oral care QID   Follow up Recommendations (tbd)      Frequency and Duration      n/a      Prognosis   n/a     Swallow Study   General Date of Onset: 05/30/15 HPI: 68 yo male adm to [facility] with AMS and fever, due to possible UTI and findings concerning for pna *right middle lobe per imaging study.  Pt has h/o epiglottic cancer 2014 s/p radiation tx and recent lung mets and has undergone palliative chemo.  Also with + seizure, myleodysplastic syndrome.  Cachexia per oncologist note with decreased ability to consume po.  Swallow evaluation ordered.  Pt has lost weight but has not had recent pna until now per his wife.  Wife reports pt having pill dysphagia and some difficulties with meats.   Type of Study: Bedside Swallow Evaluation Diet Prior to this Study: Dysphagia 3 (soft);Thin liquids Temperature Spikes Noted: Yes(101.3) Respiratory Status: Nasal cannula History of Recent Intubation: No Behavior/Cognition: Alert;Other (Comment)(delayed responses) Oral Care Completed by SLP: No Oral Cavity - Dentition: Other (Comment)(present) Self-Feeding Abilities: Able to feed self Patient Positioning: Upright in bed Baseline Vocal Quality: Aphonic Volitional Cough: Strong(but not productive, family states pt "swallows it down" and "doesn't spit it out")    Oral/Motor/Sensory Function Overall Oral Motor/Sensory Function: Within functional limits   Ice Chips Ice chips: Not tested   Thin Liquid Thin Liquid: Not tested Other Comments: pt declined to consume liquids during meal    Nectar Thick Nectar Thick Liquid: Not tested   Honey Thick Honey Thick Liquid: Not tested   Puree Puree: Impaired Presentation: Self Fed;Spoon Pharyngeal Phase Impairments: Cough - Immediate;Cough - Delayed;Multiple swallows    Solid     Solid: Impaired Presentation: Self Fed Pharyngeal Phase Impairments: Multiple swallows;Cough - Immediate;Cough - Delayed      Macario Golds 05/19/2018,10:52 AM  Luanna Salk, MS [place] Pager [phone] Office [phone]

## 2018-05-29 NOTE — Telephone Encounter (Signed)
Wife called to cancel tomorrow's appts. Adrian [name] was admitted to [facility] today.

## 2018-05-29 NOTE — ED Notes (Signed)
Bed: RESB Expected date:  Expected time:  Means of arrival:  Comments: 68 yo M/ shortness of breath poss sepsis

## 2018-05-29 NOTE — Progress Notes (Signed)
BSE completed, full report to follow.  Pt presents with clinical indications of aspiration with po intake.  Note s/p radiation and ? Surgical intervention.    MBS indicated to determine if strategies may assist to mitigate aspiration.  Encourage pt to "hock" and cough to clear if indicated and to rest if short of breath or excessively coughing.    Pt, wife and daughter agreeable to [facility] plan to mitigate aspiration only - do not anticipate prevention possible.  Pt on list for after noon in xray.    Luanna Salk, [facility] SLP Acute Rehab Services Pager [phone] Office [phone]

## 2018-05-29 NOTE — Care Management (Signed)
This is a no charge note  Pending admission per Dr. Leonides Schanz  68 year old man with a past medical history of lung cancer, larynx/epiglottis squamous cancer, stopped chemotherapy several months ago, COPD on oxygen at home, anemia, seizure, CHF with EF 45%, depression, anxiety, GERD, who presents with shortness of breath, respiratory distress with oxygen desaturation to 80%.  Urinalysis positive for UTI, chest x-ray showed right middle lobe infiltration.  Patient was started with vancomycin, cefepime and Flagyl.  Initially blood pressure was 81/57, which improved to 109/70 after giving 1.5 L normal saline bolus. Pt is admitted to stepdown bed as inpatient.   Ivor Costa, MD  [facility] Pager [phone]  If 7PM-7AM, please contact night-coverage [email or web address] Password [facility] 05/27/2018, 6:19 AM

## 2018-05-29 NOTE — Progress Notes (Signed)
[facility]  Active pt with [facility] Pharm[facility] on home IV hydration prior to this admission.  [facility] Infusion Coordinator/HH team will follow pt while here to support DC plans.   If patient discharges after hours, please call [phone].   Larry Sierras 05/14/2018, 8:58 AM

## 2018-05-29 NOTE — Progress Notes (Signed)
Initial Nutrition Assessment  DOCUMENTATION CODES:   Severe malnutrition in context of chronic illness, Underweight  INTERVENTION:  - Will order Ensure Enlive BID, each supplement provides 350 kcal and 20 grams of protein. - Will order 15 ml liquid multivitamin. - Will monitor for results of MBS.    NUTRITION DIAGNOSIS:   Severe Malnutrition related to chronic illness, cancer and cancer related treatments as evidenced by severe fat depletion, severe muscle depletion.  GOAL:   Patient will meet greater than or equal to 90% of their needs  MONITOR:   PO intake, Supplement acceptance, Weight trends, Labs, I & O's  REASON FOR ASSESSMENT:   Consult Assessment of nutrition requirement/status  ASSESSMENT:   68 y.o. male with past medical history of epiglottic cancer s/p radiation, lung s/p radiation and palliative chemo in the past (last chemo 12/2017), myelodysplastic syndrome on low-dose prednisone, RA, seizure disorder, COPD not on oxygen, and CHF. He presented to the ED with complaints of SOB for almost 3 hours. Patient complained of fever at home but denied any runny nose, congestion, chest pain, or sputum production. He does have mild cough.  Patient states that he occasionally has some difficulty swallowing and had a PEG tube in the past.  No intakes documented since admission as diet advanced from NPO to Soft at 8 AM today. Breakfast tray on bedside table and it appeared that patient had taken only a few bites. Wife was at bedside and provided nearly all information. At home patient would drink a few sips of Boost per day. He would typically eat 3 very small meals/day and was able to self feed. He had difficulty chewing and subsequently swallowing meat and has difficulty swallowing pills. Wife also indicated some difficulty in swallowing other foods but could not think of anything when asked for specific examples.   Patient was seen by SLP earlier this AM. Plan is for MBS this  afternoon.   Per chart review, current weight is 96 lb. Weight has been stable over the past 1 month. On 03/28/18 patient weighed 98 lb indicating 2 lb weight loss (2% body weight) in the past 2 month. On 02/28/18 he weighed 104 lb indicating 8 lb weight loss (8% body weight) in the past 3 months; significant for time frame.    Medications reviewed; 325 mg ferrous sulfate/day, 1 mg folvite/day, 60 mg solu-medrol QID, 1000 units oral vitamin E/day.  Labs reviewed; K: 5.3 mmol/l, BUN: 47 mg/dl, creatinine: 1.47 mg/dl, Ca: 8.2 mg/dl. IVF; NS @ 125 ml/hr.     NUTRITION - FOCUSED PHYSICAL EXAM:    Most Recent Value  Orbital Region  Moderate depletion  Upper Arm Region  Severe depletion  Thoracic and Lumbar Region  Unable to assess  Buccal Region  Severe depletion  Temple Region  Unable to assess  Clavicle Bone Region  Severe depletion  Clavicle and Acromion Bone Region  Severe depletion  Scapular Bone Region  Unable to assess  Dorsal Hand  Severe depletion  Patellar Region  Moderate depletion  Anterior Thigh Region  Severe depletion  Posterior Calf Region  Severe depletion  Edema (RD Assessment)  None  Hair  Reviewed  Eyes  Reviewed  Mouth  Reviewed  Skin  Reviewed       Diet Order:   Diet Order            DIET SOFT Room service appropriate? Yes; Fluid consistency: Thin  Diet effective now  EDUCATION NEEDS:   No education needs have been identified at this time  Skin:  Skin Assessment: Reviewed RN Assessment  Last BM:  PTA/unknown  Height:   Ht Readings from Last 1 Encounters:  06/04/2018 6\' 1"  (1.854 m)    Weight:   Wt Readings from Last 1 Encounters:  05/15/2018 43.7 kg    Ideal Body Weight:  83.64 kg  BMI:  Body mass index is 12.71 kg/m.  Estimated Nutritional Needs:   Kcal:  1750-1970 kcal  Protein:  80-96 grams (1.8-2.2 grams/kg)  Fluid:  >/= 1.8 L/day     Jarome Matin, MS, RD, LDN, [facility] Inpatient Clinical Dietitian Pager #  [phone] After hours/weekend pager # [phone]

## 2018-05-29 NOTE — Progress Notes (Signed)
Modified Barium Swallow Progress Note  Patient Details  Name: Adrian [name] MRN: [id number] Date of Birth: 1950-08-16  Today's Date: 06/06/2018  Modified Barium Swallow completed.  Full report located under Chart Review in the Imaging Section.  Brief recommendations include the following:  Clinical Impression  Pt presents with moderate oral and gross pharyngeal = cervical esophageal dysphagia resulting in delayed oral transiting, piecemealing, lingual pumping and premature spillage.  Very poor pharyngeal/tongue base motility noted secondary to fibrosis from XRT in addition to ? absence or severely limited in size epiglottis.  Pt aspirated thin, nectar, honey due to delayed swallow, poor epiglottic deflection and decreased motility.  Barium spills over small epiglottis into open airway and aspiration was largely silent,  He also aspirated secretions that were retained in pharynx and pyriform sinus- mixing with barium.  Aspiration occurred before, during and after the swallow - largely silent unless gross or reached level of the carina.  Cued and reflexive cough did not clear aspirates due to weakness/inadequate vocal fold closure. Intermittently removed penetrates of liquids into pharynx where he re-aspirated from re-spillage into airway.   Various postures including head turn with and without chin tuck not helpful to decrease aspiration.  This patient is aspirating secretions at this point and this will not be prevented.  Nectar via tsp with less pharyngeal residuals due to decreased bolus size.  Pt with less residuals with pudding and solid than liquids  = presumed due to increased neuromuscular input prompting stronger pharyngeal swallow and increased duration of UES opening.   Pt advised SLP that he enjoys eating despite level of dysphagia/coughing with intake and there are not plans for a PEG tube placement again per pt.  SLP will educate family/pt regarding compensation strategies to mitigate  aspiration.  Intake should be understood for comfort not nutritional support as pt's dysphagia, chronic aspiration prohibits this expectation.  With pt's current level of dysphagia, metastatic cancer s/p chemo, anticipate he will continue to lose weight with increased asp pna risk.  Highly recommend a palliative consult to establish goals of care/plan as the weaker this pt becomes the increased pulmonary ramification risks will be present.   Swallow Evaluation Recommendations       SLP Diet Recommendations: Other (Comment)(pt is overtly aspirating, if desires po- should be for comfort only to honor pt's wishes, consider tsp of nectar with meals to decrease amount aspirated, thin water tolerated better by lungs when/if aspirated)   Liquid Administration via: Cup;Straw   Medication Administration: Crushed with puree   Supervision: Patient able to self feed   Compensations: Multiple dry swallows after each bite/sip;Other (Comment);Small sips/bites;Slow rate(cough and expectorate prn, stop po if coughing excessively)   Postural Changes: Remain semi-upright after after feeds/meals (Comment);Seated upright at 90 degrees   Oral Care Recommendations: Oral care BID       Adrian Salk, MS [place] Pager [phone] Office [phone]  Adrian [name] 05/13/2018,2:42 PM

## 2018-05-29 NOTE — ED Provider Notes (Signed)
TIME SEEN: 5:11 AM  CHIEF COMPLAINT: Shortness of breath  HPI: Patient is a 68 year old male with previous history of epiglottic cancer, current lung cancer on supportive therapy only, MDS on low dose prednisone thearpy who presents to the emergency department via EMS for shortness of breath.  Wife reports that symptoms started approximately 3 hours prior to arrival where he felt more short of breath.  Patient was found to be tachycardic, tachypneic with sats in the 80s with EMS.  He does wear oxygen chronically at home.  Placed on nonrebreather prior to arrival.  Was febrile with EMS.  Given IV fluids in route.  Patient reports he has had a flu vaccination this year.  No known sick contacts.  He denies any pain.  Has had cough.  No vomiting or diarrhea.  It appears per our records his last chemotherapy was in August 2019.  PCP - [facility]  ROS: See HPI Constitutional:  fever  Eyes: no drainage  ENT: no runny nose   Cardiovascular:  no chest pain  Resp:  SOB  GI: no vomiting GU: no dysuria Integumentary: no rash  Allergy: no hives  Musculoskeletal: no leg swelling  Neurological: no slurred speech ROS otherwise negative  PAST MEDICAL HISTORY/PAST SURGICAL HISTORY:  Past Medical History:  Diagnosis Date  . Anxiety   . Arthritis    rheumatoid  . Cancer ([place]) 03/11/2013   larnyx/epiglottic Squamous cell in situ  . Emphysema of lung ([place])   . Esophageal reflux    not current  . History of blood transfusion   . History of radiation therapy 05/13/2013-06/28/2013   68 gray to epiglottis/neck  . History of radiation therapy 04/06/17-04/18/17   right SBRT lung 50 Gy in 5 fractions  . Megaloblastic anemia   . Pneumonia 01/2013  . Seizures ([place])    last one was 8 years ago- 2008  . Shortness of breath    with exertion  . Throat pain 06/13/2013    MEDICATIONS:  Prior to Admission medications   Medication Sig Start Date End Date Taking? Authorizing Provider  albuterol  (PROVENTIL HFA;VENTOLIN HFA) 108 (90 Base) MCG/ACT inhaler Inhale 2 puffs into the lungs every 4 (four) hours as needed for wheezing or shortness of breath. 11/04/16   Byrum, Rose Fillers, MD  ferrous sulfate 325 (65 FE) MG tablet Take 325 mg by mouth daily with breakfast.    [provider]  folic acid (FOLVITE) 1 MG tablet Take 1 mg by mouth daily.    [provider]  Glycopyrrolate-Formoterol (BEVESPI AEROSPHERE) 9-4.8 MCG/ACT AERO Inhale 2 puffs into the lungs 2 (two) times daily. 11/04/16   Collene Gobble, MD  levETIRAcetam (KEPPRA) 500 MG tablet Take 1 tablet (500 mg total) by mouth 2 (two) times daily. 01/15/18   Garvin Fila, MD  mirtazapine (REMERON) 7.5 MG tablet Take 1 tablet (7.5 mg total) by mouth at bedtime. 05/04/18   Tanner, Lyndon Code., PA-C  predniSONE (DELTASONE) 20 MG tablet Take 1 tablet (20 mg total) by mouth daily with breakfast. Patient taking differently: Take 10 mg by mouth daily with breakfast.  02/14/18   [name], MD  vitamin E 1000 UNIT capsule Take by mouth.    [provider]    ALLERGIES:  Allergies  Allergen Reactions  . No Known Allergies     SOCIAL HISTORY:  Social History   Tobacco Use  . Smoking status: Former Smoker    Packs/day: 0.12    Years:  40.00    Pack years: 4.80    Types: Cigarettes    Last attempt to quit: 10/04/2016    Years since quitting: 1.6  . Smokeless tobacco: Never Used  . Tobacco comment: smoke 1 to 2 a day  Substance Use Topics  . Alcohol use: No    Alcohol/week: 0.0 standard drinks    FAMILY HISTORY: Family History  Problem Relation Age of Onset  . Hypertension Mother   . Arthritis/Rheumatoid Mother   . Stroke Mother     EXAM: BP (!) 81/57   Pulse (!) 152   Temp (!) 101.3 F (38.5 C) (Rectal)   Resp (!) 40   Ht 6\' 1"  (1.854 m)   Wt 43.7 kg   SpO2 100%   BMI 12.71 kg/m  CONSTITUTIONAL: Alert and oriented and responds appropriately to questions.  Elderly, cachectic, chronically ill HEAD:  Normocephalic EYES: Conjunctivae clear, pupils appear equal, EOMI ENT: normal nose; moist mucous membranes NECK: Supple, no meningismus, no nuchal rigidity, no LAD  CARD: Regular and tachycardic; S1 and S2 appreciated; no murmurs, no clicks, no rubs, no gallops RESP: Tachypneic, only able to speak a few words at a time, hypoxic on room air, doing well on 2 L nasal cannula, diminished aeration at bases bilaterally ABD/GI: Normal bowel sounds; non-distended; soft, non-tender, no rebound, no guarding, no peritoneal signs, no hepatosplenomegaly BACK:  The back appears normal and is non-tender to palpation, there is no CVA tenderness EXT: Normal ROM in all joints; non-tender to palpation; no edema; normal capillary refill; no cyanosis, no calf tenderness or swelling    SKIN: Normal color for age and race; warm; no rash NEURO: Moves all extremities equally PSYCH: The patient's mood and manner are appropriate. Grooming and personal hygiene are appropriate.  MEDICAL DECISION MAKING: Patient here with sepsis.  He is febrile, tachycardic, tachypneic, hypotensive.  Will start septic work-up and give 22mL/kg IV fluid bolus and broad-spectrum antibiotics.  Will obtain labs, cultures, urine, chest x-ray, flu swab.  Likely respiratory source given cough and shortness of breath.  He does have over thousand milliliters of urine in his bladder.  Will place Foley catheter.  Abdominal exam benign.  He is extremely cachectic and chronically ill-appearing on exam.  Discussed with patient and wife that he is very sick today and if he were to decline that I do not feel he would do well with resuscitative measures such as CPR and intubation.  At this time patient and wife have a hard time deciding on CODE STATUS therefore we will keep him a full code.  ED PROGRESS: Vital signs quickly improving with IV hydration.  Patient's lactate is 4.5.  White blood cell count of 33,000.  Blood gas shows metabolic alkalosis likely from  hyperventilation.  Patient appears to have a urinary tract infection as well as a right middle lobe pneumonia.  Flu swab negative.   6:08 AM Discussed patient's case with hospitalist, Dr. Blaine Hamper.  I have recommended admission and patient (and family if present) agree with this plan. Admitting physician will place admission orders.   I reviewed all nursing notes, vitals, pertinent previous records, EKGs, lab and urine results, imaging (as available).       EKG Interpretation  Date/Time:  Tuesday May 29 2018 05:13:57 EST Ventricular Rate:  152 PR Interval:    QRS Duration: 92 QT Interval:  325 QTC Calculation: 512 R Axis:   84 Text Interpretation:  Sinus tachycardia Multiple premature complexes, vent & supraven Aberrant complex  Borderline right axis deviation Nonspecific T abnormalities, inferior leads RAte faster than previous Artifact present Confirmed by Pryor Curia [phone]) on 06/04/2018 5:40:01 AM         CRITICAL CARE Performed by: Cyril Mourning Rozalynn Buege   Total critical care time: 65 minutes  Critical care time was exclusive of separately billable procedures and treating other patients.  Critical care was necessary to treat or prevent imminent or life-threatening deterioration.  Critical care was time spent personally by me on the following activities: development of treatment plan with patient and/or surrogate as well as nursing, discussions with consultants, evaluation of patient's response to treatment, examination of patient, obtaining history from patient or surrogate, ordering and performing treatments and interventions, ordering and review of laboratory studies, ordering and review of radiographic studies, pulse oximetry and re-evaluation of patient's condition.    Kerwin Augustus, Delice Bison, DO 05/21/2018 [phone]

## 2018-05-29 NOTE — ED Notes (Signed)
Ward, MD notified of 999+ bladder scan volume.

## 2018-05-29 NOTE — Progress Notes (Signed)
Pharmacy Antibiotic Note  Adrian [name] is a 68 y.o. male admitted on 05/12/2018 with pneumonia, sepsis and UTI.  Pharmacy has been consulted for Vancomycin dosing, and had renally adjusted Cefepime.  SCr 1.47 (baseline ~ 0.8) > 1.28 Lactic acid 4.5 > 2.83 Tm 101.3  Plan: Cefepime 2g IV x1, followed by 1g IV q12h Vancomycin 1g IV x1, followed by 750 mg IV q36h. Expected AUC: 491 using SCr 1.3 Check vancomycin levels if remains on vancomycin > 3-4 days.  Goal AUC 400-550. Follow up renal fxn, culture results, and clinical course. F/u ability to de-escalate antibiotics.   Height: 6\' 1"  (185.4 cm) Weight: 96 lb 5.5 oz (43.7 kg) IBW/kg (Calculated) : 79.9  Temp (24hrs), Avg:101.3 F (38.5 C), Min:101.3 F (38.5 C), Max:101.3 F (38.5 C)  Recent Labs  Lab 06/01/2018 0500 05/14/2018 0525  WBC 33.0*  --   CREATININE 1.47*  --   LATICACIDVEN 4.5* 2.83*    Estimated Creatinine Clearance: 29.3 mL/min (A) (by C-G formula based on SCr of 1.47 mg/dL (H)).    Allergies  Allergen Reactions  . No Known Allergies     Antimicrobials this admission: 1/21 Cefepime >> 1/21 Vancomycin >> 1/21 Metronidazole >>  Dose adjustments this admission:   Microbiology results: 1/21 BCx:  1/21 UCx:   1/21 Influenza a/b:  Neg/neg  Sputum:   1/21 MRSA PCR:   Thank you for allowing pharmacy to be a part of this patient's care.  Gretta Arab PharmD, BCPS Pager [phone] 05/20/2018 7:33 AM

## 2018-05-29 NOTE — ED Notes (Signed)
Family members at bedside. Patient states no pain.

## 2018-05-29 NOTE — ED Triage Notes (Signed)
Per EMS, patient coming from home with complaints of SOB. According to family, patient started having difficulty breathing tonight, family reports that patient was breathing about 50 breaths per minute.  Patient's oxygen saturation was in the 80s with EMS. Patient does wear oxygen at home. Patient was placed on non-rebreather. Temporal temperature with EMS was 101.2.   Patient has a hx of COPD and lung cancer. Last chemo treatment was about 2 weeks ago.

## 2018-05-29 NOTE — Progress Notes (Addendum)
MBS completed, full report to follow.  Pt presents with moderate oral and gross pharyngeal = cervical esophageal dysphagia resulting in delayed oral transiting, piecemealing, lingual pumping and premature spillage.  Decreased motility secondary to fibrosis from XRT.  Pt aspirated thin, nectar, honey due to delayed swallow, ? Absence or severely limited in size epiglottis and dysmotility.  He also aspirated secretions that were retained in pharynx and pyriform sinus- mixing with barium.  Aspiration occurred before, during and after the swallow - largely silent unless gross or reached level of the carina.  Cued and reflexive cough did not clear aspirates.  Various postures including head turn with and without chin tuck not helpful to decrease aspiration.  Nectar via tsp helpful to decrease amount of pharyngeal residuals due to decreased bolus size and aspirates but not preventative.  Pt with less residuals with pudding and solid than liquids  = presumed due to increased neuromuscular input prompting stronger pharyngeal swallow and increased duration of UES opening.   Pt advised he enjoys eating despite level of dysphagia/coughing with intake and SLP will educate family/pt regarding compensation strategies to mitigate aspiration.  With pt's current level of dysphagia, metastatic cancer s/p chemo, anticipate he will continue to lose weight with increased asp pna risk.  Highly recommend a palliative consult to establish goals of care/plan as the weaker this pt becomes the increased pulmonary ramification risks will be present.   Luanna Salk, [facility] SLP Acute Rehab Services Pager [phone] Office [phone]

## 2018-05-29 NOTE — H&P (Addendum)
[facility] History and Physical  Adrian [name] FXT:[id number] DOB: [birth year] DOA: 05/22/2018  Referring physician: ED  PCP: [name], MD   Chief Complaint: Shortness of breath, dyspnea  HPI: Adrian [name] is a 68 y.o. male with past medical history of epiglottic cancer status post radiation, lung mass status post radiation and chemotherapy in the past (last chemotherapy August 2019), myelodysplastic syndrome on low-dose prednisone, rheumatoid arthritis, seizure disorder followed by oncology Dr. Alvy Bimler, COPD not on oxygen, congestive heart failure ejection fraction of 45%, presented to the hospital with complaints of shortness of breath for almost 3 hours prior to arrival to the ED. EMS was called in and patient was noted to be tachycardic, tachypneic with saturation in the 80s and was noted to be febrile. Patient was put on nonrebreather mask prior to arrival to the hospital.  Patient complained of fever at home but denied any runny nose,  congestion chest pain or sputum production.  He does have mild cough.  Patient states that he occasionally has some difficulty swallowing and used to have a PEG tube in the past.  Patient denies any recent travel or sick contacts.  Denies any nausea, vomiting or abdominal pain.  Denies any diarrhea or constipation.  Patient denies any urinary urgency, frequency or dysuria but had urinary retention in the ED and required a Foley catheter.  Patient used to see pulmonary in the past but has not seen him recently and he is not on any inhalers.  ED Course: In the ED, patient patient was tachycardic, tachypneic and hypertensive.  He was febrile with a maximum temperature of 101.3 F.  He was thought to have sepsis and was given 30 mls per KG IV fluid bolus.   Pancultures were sent.  Lactate on presentation was 4.5.  WBC was elevated at 33,000.  Arterial blood gas showed metabolic alkalosis from hyperventilation.  Patient was noted to have infiltrate in  the right middle lobe and urinalysis was abnormal.  Patient was then considered for admission to the hospital for sepsis likely secondary to pneumonia and UTI.  Review of Systems:  All systems were reviewed and were negative unless otherwise mentioned in the HPI  Past Medical History:  Diagnosis Date  . Anxiety   . Arthritis    rheumatoid  . Cancer ([place]) 03/11/2013   larnyx/epiglottic Squamous cell in situ  . Emphysema of lung ([place])   . Esophageal reflux    not current  . History of blood transfusion   . History of radiation therapy 05/13/2013-06/28/2013   68 gray to epiglottis/neck  . History of radiation therapy 04/06/17-04/18/17   right SBRT lung 50 Gy in 5 fractions  . Megaloblastic anemia   . Pneumonia 01/2013  . Seizures ([place])    last one was 8 years ago- 2008  . Shortness of breath    with exertion  . Throat pain 06/13/2013   Past Surgical History:  Procedure Laterality Date  . ABDOMINAL HERNIA REPAIR    . COLONOSCOPY    . IR FLUORO GUIDE PORT INSERTION RIGHT  08/31/2016  . IR US GUIDE VASC ACCESS RIGHT  08/31/2016  . MINOR PLACEMENT OF FIDUCIAL  08/10/2016   Procedure: MINOR PLACEMENT OF FIDUCIAL x3;  Surgeon: Collene Gobble, MD;  Location: [place];  Service: Thoracic;;  . MULTIPLE EXTRACTIONS WITH ALVEOLOPLASTY N/A 04/16/2013   Procedure: Extraction of tooth #'s 2,5,6,7,10,11,15,17,21,22,27,29 with alveoloplasty, bilateral maxillary fibrous tuberosity reductions, removal of excess tissues of mandibular arch, and mandibular left  lingual exostoses reductions.;  Surgeon: Lenn Cal, DDS;  Location: [place];  Service: Oral Surgery;  Laterality: N/A;  . PEG PLACEMENT  2014  . tracheostomy  2014  . VIDEO BRONCHOSCOPY WITH ENDOBRONCHIAL NAVIGATION N/A 08/10/2016   Procedure: VIDEO BRONCHOSCOPY WITH ENDOBRONCHIAL NAVIGATION;  Surgeon: Collene Gobble, MD;  Location: [place];  Service: Thoracic;  Laterality: N/A;    Social History:  reports that he quit smoking about 19 months ago. His  smoking use included cigarettes. He has a 4.80 pack-year smoking history. He has never used smokeless tobacco. He reports that he does not drink alcohol or use drugs.  Allergies  Allergen Reactions  . No Known Allergies     Family History  Problem Relation Age of Onset  . Hypertension Mother   . Arthritis/Rheumatoid Mother   . Stroke Mother      Prior to Admission medications   Medication Sig Start Date End Date Taking? Authorizing Provider  ferrous sulfate 325 (65 FE) MG tablet Take 325 mg by mouth daily with breakfast.   Yes [provider]  folic acid (FOLVITE) 1 MG tablet Take 1 mg by mouth daily.   Yes [provider]  levETIRAcetam (KEPPRA) 500 MG tablet Take 1 tablet (500 mg total) by mouth 2 (two) times daily. 01/15/18  Yes Garvin Fila, MD  mirtazapine (REMERON) 7.5 MG tablet Take 1 tablet (7.5 mg total) by mouth at bedtime. 05/04/18  Yes Tanner, Lyndon Code., PA-C  predniSONE (DELTASONE) 20 MG tablet Take 1 tablet (20 mg total) by mouth daily with breakfast. 02/14/18  Yes Gorsuch, Ni, MD  vitamin E 1000 UNIT capsule Take 1,000 Units by mouth daily.    Yes [provider]  albuterol (PROVENTIL HFA;VENTOLIN HFA) 108 (90 Base) MCG/ACT inhaler Inhale 2 puffs into the lungs every 4 (four) hours as needed for wheezing or shortness of breath. Patient not taking: Reported on 05/17/2018 11/04/16   Collene Gobble, MD  Glycopyrrolate-Formoterol (BEVESPI AEROSPHERE) 9-4.8 MCG/ACT AERO Inhale 2 puffs into the lungs 2 (two) times daily. Patient not taking: Reported on 05/17/2018 11/04/16   Collene Gobble, MD    Physical Exam: Vitals:   05/30/2018 0530 05/11/2018 0545 05/17/2018 0600 05/24/2018 0645  BP: (!) 103/58 109/75 109/67 102/72  Pulse: (!) 143 (!) 128 (!) 123 (!) 123  Resp: (!) 31 (!) 30 (!) 27 (!) 23  Temp:      TempSrc:      SpO2: 95% 96% 100% 99%  Weight:      Height:       Wt Readings from Last 3 Encounters:  05/20/2018 43.7 kg  05/23/18 43.7 kg  05/16/18  43.5 kg   Body mass index is 12.71 kg/m.  General: Alert awake and oriented.  Extremely cachectic.  On nasal cannula HENT: Normocephalic, pupils equally reacting to light and accommodation.  No scleral pallor or icterus noted. Oral mucosa is moist.  Chest:    Diminished breath sounds bilaterally. No definite crackles or wheezes heard at the time of my exam.  CVS: S1 &S2 heard. No murmur.  Regular rate and rhythm. Abdomen: Soft, nontender, nondistended.  Bowel sounds are heard.  Foley catheter in place Extremities: No cyanosis, clubbing or edema.  Peripheral pulses are palpable. Psych: Alert, awake and oriented, normal mood CNS:  No cranial nerve deficits.  Power equal in all extremities.   No cerebellar signs.   Skin: Warm and dry.  No rashes noted.  Labs on Admission:  CBC: Recent Labs  Lab 05/28/2018 0500  WBC 33.0*  NEUTROABS 31.2*  HGB 8.2*  HCT 26.7*  MCV 130.9*  PLT 858    Basic Metabolic Panel: Recent Labs  Lab 06/05/2018 0500  NA 141  K 5.3*  CL 110  CO2 19*  GLUCOSE 98  BUN 47*  CREATININE 1.47*  CALCIUM 8.2*    Liver Function Tests: Recent Labs  Lab 06/08/2018 0500  AST 35  ALT 23  ALKPHOS 80  BILITOT 0.7  PROT 7.7  ALBUMIN 2.8*   No results for input(s): LIPASE, AMYLASE in the last 168 hours. No results for input(s): AMMONIA in the last 168 hours.  Cardiac Enzymes: No results for input(s): CKTOTAL, CKMB, CKMBINDEX, TROPONINI in the last 168 hours.  BNP (last 3 results) No results for input(s): BNP in the last 8760 hours.  ProBNP (last 3 results) No results for input(s): PROBNP in the last 8760 hours.  CBG: No results for input(s): GLUCAP in the last 168 hours.   Urinalysis    Component Value Date/Time   COLORURINE YELLOW 05/27/2018 0528   APPEARANCEUR CLOUDY (A) 05/26/2018 0528   LABSPEC 1.018 05/11/2018 0528   LABSPEC 1.015 01/24/2007 0930   PHURINE 8.0 05/13/2018 0528   GLUCOSEU NEGATIVE 05/15/2018 0528   HGBUR SMALL (A)  05/15/2018 0528   BILIRUBINUR NEGATIVE 06/03/2018 0528   BILIRUBINUR Negative 01/24/2007 0930   KETONESUR NEGATIVE 05/23/2018 0528   PROTEINUR >=300 (A) 05/24/2018 0528   UROBILINOGEN 1.0 07/06/2013 0917   NITRITE NEGATIVE 05/17/2018 0528   LEUKOCYTESUR MODERATE (A) 06/03/2018 0528   LEUKOCYTESUR Negative 01/24/2007 0930     Drugs of Abuse  No results found for: LABOPIA, COCAINSCRNUR, LABBENZ, AMPHETMU, THCU, LABBARB    Radiological Exams on Admission: Dg Chest Port 1 View  Result Date: 05/28/2018 CLINICAL DATA:  Initial evaluation for acute shortness of breath, fever. EXAM: PORTABLE CHEST 1 VIEW COMPARISON:  Prior radiograph from 05/23/2018 FINDINGS: Right-sided Port-A-Cath in place, stable. Cardiac and mediastinal silhouettes are unchanged, and may within normal limits. Lungs are hyperinflated with severe emphysematous changes. Approximate 2.7 cm spiculated nodule at the left upper lobe, corresponding with no bronchogenic carcinoma, stable. There are increased hazy opacities within the right mid lung as compared to previous, suspicious for infiltrates given history of fever. Irregular pleuroparenchymal scarring at the right upper lobe similar. Underlying interstitial congestion without overt pulmonary edema. No pleural effusion. No pneumothorax. No acute osseous finding. IMPRESSION: 1. Patchy and hazy opacity within the mid right lung, concerning for infectious infiltrate given the provided history of fever. 2. Grossly similar spiculated left upper lobe nodule, compatible with known bronchogenic carcinoma. 3. Underlying advanced emphysema. Electronically Signed   By: Jeannine Boga M.D.   On: 06/02/2018 05:51    EKG: Personally reviewed by me which shows sinus tachycardia, PVCs  Assessment/Plan Principal Problem:   HCAP (healthcare-associated pneumonia) Active Problems:   Anemia   MDS (myelodysplastic syndrome), low grade (HCC)   COPD, mild (HCC)   History of head and neck  cancer   Hyperkalemia   Mild cognitive impairment   Dysphagia   Sepsis, present on admission secondary pneumonia and UTI.  Patient received septic IV fluid bolus.  Continue on broad-spectrum antibiotics.  Follow-up pancultures.  Follow lactic acid.  Any IV fluids for now.  Consider discontinuation of IV fluids when his lactate normalizes.  Watch closely for his cardiopulmonary status.  Healthcare associated pneumonia with acute hypoxic respiratory failure.  Patient will be admitted to stepdown unit.  Patient received vancomycin, cefepime and metronidazole in the ED.  We will continue with that.  Follow blood cultures.  Continue oxygen nebulizer and supportive care.  Pulmonary hygiene therapy, patient is on steroid at home.  Will change to IV Solu-Medrol.  Severe cachexia, failure to thrive.  Will get nutrition consultation.  Urinary retention, abnormal urinalysis possible UTI.  Patient is already on antibiotic.  Will follow urine cultures.  Patient received a Foley catheter.  History of dysphagia due to head and neck cancer and PEG tube placement in the past.  Will check for speech and swallow to assess for aspiration.  Patient takes a soft diet at home.  History of head and neck cancer with lung mass status post radiation treatment and chemotherapy in the past.  Patient follows up with oncology as outpatient.  Severe protein calorie malnutrition present on admission.  Will consult nutrition services.  Hyperkalemia.  Mild.  Will closely monitor.  Continue gentle hydration.  Check BMP in a.m.  History of anemia likely secondary to chronic disease, myelodysplastic syndrome on iron sulfate and folic acid.  We will continue with that.  Follows up with oncology as outpatient  Chronic systolic congestive heart failure, last known ejection fraction of 45%.  Compensated.  Will closely monitor on IV fluids.  Discontinue IV fluids once lactate normalized.  History of seizures.  Will continue on  Keppra  COPD with mild exacerbation.  Continue on oxygen, nebulizers, IV steroids, pulmonary hygiene therapy.  Mild acute kidney injury likely secondary to sepsis and volume depletion.  Will closely monitor.  Patient received adequate fluid hydration.Check BMP in a.m.  Debility, generalized weakness.  Will get physical therapy evaluation.   Consultant: None  Code Status: DNR/DNI  DVT Prophylaxis: Heparin   Antibiotics: Vancomycin, cefepime and metronidazole  Family Communication:  Patients' condition and plan of care including tests being ordered have been discussed with the patient and patient's wife and sister in law who indicate understanding and agree with the plan.  Disposition Plan: Home likely in 2 to 3 days.  Patient might need oxygen on discharge  Severity of Illness: The appropriate patient status for this patient is INPATIENT. Inpatient status is judged to be reasonable and necessary in order to provide the required intensity of service to ensure the patient's safety. The patient's presenting symptoms, physical exam findings, and initial radiographic and laboratory data in the context of their chronic comorbidities is felt to place them at high risk for further clinical deterioration. Furthermore, it is not anticipated that the patient will be medically stable for discharge from the hospital within 2 midnights of admission. T I certify that at the point of admission it is my clinical judgment that the patient will require inpatient hospital care spanning beyond 2 midnights from the point of admission due to high intensity of service, high risk for further deterioration and high frequency of surveillance required.   Signed, Flora Lipps, MD [facility] 05/28/2018  Addendum:  Patient remained hypotensive but responded with IV fluids.  Prior records reviewed.  Patient does have borderline low blood pressure at baseline.  Will hold off with further boluses at this time.   Check lactate again.  Patient did have a severe dysphagia as per speech therapist.  Goals of care need to be discussed about this patient who is severely malnourished with history of cancer.  Palliative care has been consulted.  05/31/2018 4:32 PM

## 2018-05-29 NOTE — ED Notes (Signed)
ED TO INPATIENT HANDOFF REPORT  Name/Age/Gender Adrian [name] 68 y.o. male  Code Status Code Status History    Date Active Date Inactive Code Status Order ID Comments User Context   07/03/2013 1814 07/09/2013 1458 Full Code [id number]  Oswald Hillock, MD Inpatient   02/01/2013 1914 02/12/2013 2301 Partial Code [id number] Defib for witnessed V. Fib arrest only, cardioversion elective only Acquanetta Chain [facility] Inpatient   01/23/2013 8413 02/01/2013 1914 Full Code [id number]  Rise Patience, MD Inpatient      Home/SNF/Other Home  Chief Complaint Shortness of breath  Level of Care/Admitting Diagnosis ED Disposition    ED Disposition Condition [facility] Area: [place] [[id number]]  Level of Care: Stepdown [14]  Admit to SDU based on following criteria: Hemodynamic compromise or significant risk of instability:  Patient requiring short term acute titration and management of vasoactive drips, and invasive monitoring (i.e., CVP and Arterial line).  Diagnosis: HCAP (healthcare-associated pneumonia) [253664]  Admitting Physician: Ivor Costa [[id number]]  Attending Physician: Ivor Costa [phone]  Estimated length of stay: past midnight tomorrow  Certification:: I certify this patient will need inpatient services for at least 2 midnights  PT Class (Do Not Modify): Inpatient [101]  PT Acc Code (Do Not Modify): Private [1]       Medical History Past Medical History:  Diagnosis Date  . Anxiety   . Arthritis    rheumatoid  . Cancer ([place]) 03/11/2013   larnyx/epiglottic Squamous cell in situ  . Emphysema of lung ([place])   . Esophageal reflux    not current  . History of blood transfusion   . History of radiation therapy 05/13/2013-06/28/2013   68 gray to epiglottis/neck  . History of radiation therapy 04/06/17-04/18/17   right SBRT lung 50 Gy in 5 fractions  . Megaloblastic anemia   . Pneumonia 01/2013  . Seizures ([place])    last one was 8 years ago- 2008  .  Shortness of breath    with exertion  . Throat pain 06/13/2013    Allergies Allergies  Allergen Reactions  . No Known Allergies     IV Location/Drains/Wounds Patient Lines/Drains/Airways Status   Active Line/Drains/Airways    Name:   Placement date:   Placement time:   Site:   Days:   Implanted Port 08/31/16 Right Chest   08/31/16    1408    Chest   636   Peripheral IV 06/02/2018 Left Forearm   05/21/2018    -    Forearm   less than 1   Urethral Catheter Sam, NT Latex 16 Fr.   05/16/2018    0530    Latex   less than 1          Labs/Imaging Results for orders placed or performed during the hospital encounter of 05/24/2018 (from the past 48 hour(s))  Comprehensive metabolic panel     Status: Abnormal   Collection Time: 05/28/2018  5:00 AM  Result Value Ref Range   Sodium 141 135 - 145 mmol/L   Potassium 5.3 (H) 3.5 - 5.1 mmol/L   Chloride 110 98 - 111 mmol/L   CO2 19 (L) 22 - 32 mmol/L   Glucose, Bld 98 70 - 99 mg/dL   BUN 47 (H) 8 - 23 mg/dL   Creatinine, Ser 1.47 (H) 0.61 - 1.24 mg/dL   Calcium 8.2 (L) 8.9 - 10.3 mg/dL   Total Protein 7.7 6.5 - 8.1 g/dL   Albumin 2.8 (  L) 3.5 - 5.0 g/dL   AST 35 15 - 41 U/L   ALT 23 0 - 44 U/L   Alkaline Phosphatase 80 38 - 126 U/L   Total Bilirubin 0.7 0.3 - 1.2 mg/dL   GFR calc non Af Amer 48 (L) >60 mL/min   GFR calc Af Amer 56 (L) >60 mL/min   Anion gap 12 5 - 15    Comment: Performed at [facility], [place] [address] [place], [place] [address]  CBC WITH DIFFERENTIAL     Status: Abnormal   Collection Time: 05/14/2018  5:00 AM  Result Value Ref Range   WBC 33.0 (H) 4.0 - 10.5 K/uL    Comment: WHITE COUNT CONFIRMED ON SMEAR   RBC 2.04 (L) 4.22 - 5.81 MIL/uL   Hemoglobin 8.2 (L) 13.0 - 17.0 g/dL   HCT 26.7 (L) 39.0 - 52.0 %   MCV 130.9 (H) 80.0 - 100.0 fL   MCH 40.2 (H) 26.0 - 34.0 pg   MCHC 30.7 30.0 - 36.0 g/dL   RDW 21.3 (H) 11.5 - 15.5 %   Platelets 292 150 - 400 K/uL   nRBC 0.1 0.0 - 0.2 %   Neutrophils Relative %  95 %   Neutro Abs 31.2 (H) 1.7 - 7.7 K/uL   Lymphocytes Relative 0 %   Lymphs Abs 0.1 (L) 0.7 - 4.0 K/uL   Monocytes Relative 3 %   Monocytes Absolute 0.9 0.1 - 1.0 K/uL   Eosinophils Relative 0 %   Eosinophils Absolute 0.0 0.0 - 0.5 K/uL   Basophils Relative 0 %   Basophils Absolute 0.1 0.0 - 0.1 K/uL   WBC Morphology MILD LEFT SHIFT (1-5% METAS, OCC MYELO, OCC BANDS)     Comment: VACUOLATED NEUTROPHILS   Immature Granulocytes 2 %   Abs Immature Granulocytes 0.71 (H) 0.00 - 0.07 K/uL   Polychromasia PRESENT     Comment: Performed at [facility], [place] [address], [place], [place] [address]  Lactic acid, plasma     Status: Abnormal   Collection Time: 06/08/2018  5:00 AM  Result Value Ref Range   Lactic Acid, Venous 4.5 (HH) 0.5 - 1.9 mmol/L    Comment: NO VISIBLE HEMOLYSIS CRITICAL RESULT CALLED TO, READ BACK BY AND VERIFIED WITH: [place] RN AT 0550 05/20/2018 CRUICKSHANK A. Performed at [facility], [place] [address], [place], [place] [address]   Blood gas, arterial     Status: Abnormal   Collection Time: 05/18/2018  5:06 AM  Result Value Ref Range   O2 Content 3.0 L/min   Delivery systems NASAL CANNULA    pH, Arterial 7.498 (H) 7.350 - 7.450   pCO2 arterial 24.1 (L) 32.0 - 48.0 mmHg   pO2, Arterial 87.4 83.0 - 108.0 mmHg   Bicarbonate 18.2 (L) 20.0 - 28.0 mmol/L   Acid-base deficit 3.1 (H) 0.0 - 2.0 mmol/L   O2 Saturation 95.3 %   Patient temperature 101.3    Collection site RIGHT RADIAL    Drawn by [id number]    Sample type ARTERIAL DRAW    Allens test (pass/fail) PASS PASS    Comment: Performed at [facility], [place] [address], [place], [place] [address]  I-stat troponin, ED (not at [facility], [facility])     Status: None   Collection Time: 06/03/2018  5:23 AM  Result Value Ref Range   Troponin i, poc 0.01 0.00 - 0.08 ng/mL   Comment 3  Comment: Due to the release kinetics of cTnI, a negative result within the first hours of  the onset of symptoms does not rule out myocardial infarction with certainty. If myocardial infarction is still suspected, repeat the test at appropriate intervals.   I-Stat CG4 Lactic Acid, ED     Status: Abnormal   Collection Time: 05/23/2018  5:25 AM  Result Value Ref Range   Lactic Acid, Venous 2.83 (HH) 0.5 - 1.9 mmol/L   Comment NOTIFIED PHYSICIAN   Urinalysis, Routine w reflex microscopic     Status: Abnormal   Collection Time: 05/13/2018  5:28 AM  Result Value Ref Range   Color, Urine YELLOW YELLOW   APPearance CLOUDY (A) CLEAR   Specific Gravity, Urine 1.018 1.005 - 1.030   pH 8.0 5.0 - 8.0   Glucose, UA NEGATIVE NEGATIVE mg/dL   Hgb urine dipstick SMALL (A) NEGATIVE   Bilirubin Urine NEGATIVE NEGATIVE   Ketones, ur NEGATIVE NEGATIVE mg/dL   Protein, ur >=300 (A) NEGATIVE mg/dL   Nitrite NEGATIVE NEGATIVE   Leukocytes, UA MODERATE (A) NEGATIVE   RBC / HPF >50 (H) 0 - 5 RBC/hpf   WBC, UA 21-50 0 - 5 WBC/hpf   Bacteria, UA RARE (A) NONE SEEN   Squamous Epithelial / LPF 0-5 0 - 5   Triple Phosphate Crystal PRESENT     Comment: Performed at [facility], [place] [address] [place], [place] [address]  Influenza panel by PCR (type A & B)     Status: None   Collection Time: 05/24/2018  5:28 AM  Result Value Ref Range   Influenza A By PCR NEGATIVE NEGATIVE   Influenza B By PCR NEGATIVE NEGATIVE    Comment: (NOTE) The Xpert Xpress Flu assay is intended as an aid in the diagnosis of  influenza and should not be used as a sole basis for treatment.  This  assay is FDA approved for nasopharyngeal swab specimens only. Nasal  washings and aspirates are unacceptable for Xpert Xpress Flu testing. Performed at [facility], [place] [address] [place], [place] [address]    Dg Chest Port 1 View  Result Date: 05/31/2018 CLINICAL DATA:  Initial evaluation for acute shortness of breath, fever. EXAM: PORTABLE CHEST 1 VIEW COMPARISON:  Prior radiograph from  05/23/2018 FINDINGS: Right-sided Port-A-Cath in place, stable. Cardiac and mediastinal silhouettes are unchanged, and may within normal limits. Lungs are hyperinflated with severe emphysematous changes. Approximate 2.7 cm spiculated nodule at the left upper lobe, corresponding with no bronchogenic carcinoma, stable. There are increased hazy opacities within the right mid lung as compared to previous, suspicious for infiltrates given history of fever. Irregular pleuroparenchymal scarring at the right upper lobe similar. Underlying interstitial congestion without overt pulmonary edema. No pleural effusion. No pneumothorax. No acute osseous finding. IMPRESSION: 1. Patchy and hazy opacity within the mid right lung, concerning for infectious infiltrate given the provided history of fever. 2. Grossly similar spiculated left upper lobe nodule, compatible with known bronchogenic carcinoma. 3. Underlying advanced emphysema. Electronically Signed   By: Jeannine Boga M.D.   On: 05/19/2018 05:51    Pending Labs Unresulted Labs (From admission, onward)    Start     Ordered   06/05/2018 0515  Lactic acid, plasma  Now then every 2 hours,   STAT     05/27/2018 0514   06/05/2018 0512  Blood Culture (routine x 2)  BLOOD CULTURE X 2,   STAT     05/15/2018 2725  06/01/2018 0512  Urine culture  ONCE - STAT,   STAT     06/04/2018 0512          Vitals/Pain Today's Vitals   05/20/2018 0518 05/11/2018 0530 06/03/2018 0545 05/10/2018 0600  BP:  (!) 103/58 109/75 109/67  Pulse:  (!) 143 (!) 128 (!) 123  Resp:  (!) 31 (!) 30 (!) 27  Temp:      TempSrc:      SpO2:  95% 96% 100%  Weight: 43.7 kg     Height: 6\' 1"  (1.854 m)     PainSc:        Isolation Precautions Droplet precaution  Medications Medications  metroNIDAZOLE (FLAGYL) IVPB 500 mg ( Intravenous Rate/Dose Verify 05/11/2018 0554)  vancomycin (VANCOCIN) IVPB 1000 mg/200 mL premix (1,000 mg Intravenous New Bag/Given 05/14/2018 0554)  0.9 %  sodium chloride infusion (  Intravenous New Bag/Given 05/28/2018 0527)  sodium chloride 0.9 % bolus 1,000 mL (0 mLs Intravenous Stopped 05/18/2018 0630)    And  sodium chloride 0.9 % bolus 500 mL (0 mLs Intravenous Stopped 05/26/2018 0631)  ceFEPIme (MAXIPIME) 2 g in sodium chloride 0.9 % 100 mL IVPB (0 g Intravenous Stopped 05/22/2018 0551)  acetaminophen (TYLENOL) tablet 1,000 mg (1,000 mg Oral Given 05/20/2018 0521)  acetaminophen (TYLENOL) suppository 650 mg (650 mg Rectal Given 06/05/2018 0530)    Mobility non-ambulatory 2

## 2018-05-30 ENCOUNTER — Inpatient Hospital Stay: Payer: Medicare Other

## 2018-05-30 ENCOUNTER — Inpatient Hospital Stay (HOSPITAL_COMMUNITY): Payer: Medicare Other

## 2018-05-30 DIAGNOSIS — I361 Nonrheumatic tricuspid (valve) insufficiency: Secondary | ICD-10-CM

## 2018-05-30 DIAGNOSIS — Z515 Encounter for palliative care: Secondary | ICD-10-CM

## 2018-05-30 DIAGNOSIS — Z66 Do not resuscitate: Secondary | ICD-10-CM

## 2018-05-30 DIAGNOSIS — Z8589 Personal history of malignant neoplasm of other organs and systems: Secondary | ICD-10-CM

## 2018-05-30 DIAGNOSIS — E43 Unspecified severe protein-calorie malnutrition: Secondary | ICD-10-CM

## 2018-05-30 DIAGNOSIS — I351 Nonrheumatic aortic (valve) insufficiency: Secondary | ICD-10-CM

## 2018-05-30 LAB — BASIC METABOLIC PANEL
Anion gap: 8 (ref 5–15)
BUN: 37 mg/dL — ABNORMAL HIGH (ref 8–23)
CHLORIDE: 116 mmol/L — AB (ref 98–111)
CO2: 17 mmol/L — ABNORMAL LOW (ref 22–32)
Calcium: 7.5 mg/dL — ABNORMAL LOW (ref 8.9–10.3)
Creatinine, Ser: 0.89 mg/dL (ref 0.61–1.24)
GFR calc Af Amer: >60 mL/min (ref >60)
Glucose, Bld: 116 mg/dL — ABNORMAL HIGH (ref 70–99)
POTASSIUM: 4.2 mmol/L (ref 3.5–5.1)
Sodium: 141 mmol/L (ref 135–145)

## 2018-05-30 LAB — CBC
HEMATOCRIT: 23.2 % — AB (ref 39.0–52.0)
HEMOGLOBIN: 7.1 g/dL — AB (ref 13.0–17.0)
MCH: 39.2 pg — ABNORMAL HIGH (ref 26.0–34.0)
MCHC: 30.6 g/dL (ref 30.0–36.0)
MCV: 128.2 fL — ABNORMAL HIGH (ref 80.0–100.0)
Platelets: 268 10*3/uL (ref 150–400)
RBC: 1.81 MIL/uL — ABNORMAL LOW (ref 4.22–5.81)
RDW: 21.4 % — ABNORMAL HIGH (ref 11.5–15.5)
WBC: 38.8 10*3/uL — AB (ref 4.0–10.5)
nRBC: 0 % (ref 0.0–0.2)

## 2018-05-30 LAB — ECHOCARDIOGRAM COMPLETE
Height: 73 in
Weight: 1481.49 oz

## 2018-05-30 LAB — LEGIONELLA PNEUMOPHILA SEROGP 1 UR AG: L. pneumophila Serogp 1 Ur Ag: NEGATIVE

## 2018-05-30 LAB — LACTIC ACID, PLASMA: Lactic Acid, Venous: 0.7 mmol/L (ref 0.5–1.9)

## 2018-05-30 MED ORDER — ORAL CARE MOUTH RINSE
15.0000 mL | Freq: Two times a day (BID) | OROMUCOSAL | Status: DC
  Administered 2018-05-30 – 2018-06-08 (×15): 15 mL via OROMUCOSAL

## 2018-05-30 MED ORDER — DEXTROSE-NACL 5-0.45 % IV SOLN
INTRAVENOUS | Status: DC
  Administered 2018-05-30 – 2018-06-01 (×3): via INTRAVENOUS

## 2018-05-30 MED ORDER — METHYLPREDNISOLONE SODIUM SUCC 40 MG IJ SOLR
40.0000 mg | Freq: Two times a day (BID) | INTRAMUSCULAR | Status: DC
  Administered 2018-05-31 – 2018-06-01 (×3): 40 mg via INTRAVENOUS
  Filled 2018-05-30 (×3): qty 1

## 2018-05-30 NOTE — Procedures (Signed)
Echo attempted. Family speaking with provider. Will attempt later.

## 2018-05-30 NOTE — Care Management Note (Signed)
Case Management Note  Patient Details  Name: GREGREY BLOYD MRN: [id number] Date of Birth: November 30, 1950  Subjective/Objective:                  Discharge Readiness Return to top of Pneumonia Due to Aspiration [facility] - [place]  Discharge readiness is indicated by patient meeting Recovery Milestones, including ALL of the following: ? Hemodynamic stability  No hypotensive bp=71/47 ? Oxygen requirement absent or at baseline no o2 sat dropps to 84% ? Infection absent or treatment at next level of care arranged ? No wbc=38.8, hgb 7.1 ? Ambulatory[P]no ? Iv ns at 100cc/hr,Iv maxipime, iv vancomycon, iv flagyl Level of care-sdu not ready for next level of care.    Action/Plan: Following for progression of care. Following for cm needs -pt active with [facility] for iv infusion for iv hydration, due to hx of esophgeal ca and R.T..  Expected Discharge Date:  (unknown)               Expected Discharge Plan:     In-House Referral:     Discharge planning Services     Post Acute Care Choice:    Choice offered to:     DME Arranged:    DME Agency:     HH Arranged:    HH Agency:     Status of Service:     If discussed at [employer] of Stay Meetings, dates discussed:    Additional Comments:  Leeroy Cha, RN 05/30/2018, 9:53 AM

## 2018-05-30 NOTE — Progress Notes (Signed)
PROGRESS NOTE  Adrian [name]  FYB:[id number] DOB: 1951/02/13 DOA: 06/06/2018 PCP: [name], MD   Brief Narrative: Adrian [name] is a 68 y.o. male with past medical history of epiglottic cancer status post radiation, lung mass status post radiation and chemotherapy in the past (last chemotherapy August 2019), myelodysplastic syndrome on low-dose prednisone, rheumatoid arthritis, seizure disorder followed by oncology Dr. Alvy Bimler, COPD not on oxygen, congestive heart failure ejection fraction of 45%, presented to the hospital with complaints of shortness of breath for almost 3 hours prior to arrival to the ED. EMS was called in and patient was noted to be tachycardic, tachypneic with saturation in the 80s and was noted to be febrile. Patient was put on nonrebreather mask prior to arrival to the hospital.  Patient complained of fever at home but denied any runny nose,  congestion chest pain or sputum production.  He does have mild cough.  Patient states that he occasionally has some difficulty swallowing and used to have a PEG tube in the past.  Patient denies any recent travel or sick contacts.  Denies any nausea, vomiting or abdominal pain.  Denies any diarrhea or constipation.  Patient denies any urinary urgency, frequency or dysuria but had urinary retention in the ED and required a Foley catheter.  Patient used to see pulmonary in the past but has not seen him recently and he is not on any inhalers.  ED Course: In the ED, patient patient was tachycardic, tachypneic and hypertensive.  He was febrile with a maximum temperature of 101.3 F.  He was thought to have sepsis and was given 30 mls per KG IV fluid bolus.   Pancultures were sent.  Lactate on presentation was 4.5.  WBC was elevated at 33,000.  Arterial blood gas showed metabolic alkalosis from hyperventilation.  Patient was noted to have infiltrate in the right middle lobe and urinalysis was abnormal.  Patient was then considered for  admission to the hospital for sepsis likely secondary to pneumonia and UTI.  Assessment & Plan: Principal Problem:   HCAP (healthcare-associated pneumonia) Active Problems:   Anemia   MDS (myelodysplastic syndrome), low grade (HCC)   COPD, mild (HCC)   History of head and neck cancer   Hyperkalemia   Mild cognitive impairment   Dysphagia   Protein-calorie malnutrition, severe   Pressure injury of skin  Sepsis to PNA and Proteus UTI:  - Continue broad antimicrobial coverage including anaerobic coverage for aspiration.  - Monitor culture data (urine, blood x2)  Dysphagia, aspiration: Has had PEG tube in past, but would not want that now.  - Per [place] discussions, continue feedings for comfort as well as all other medical treatments.   Severe cachexia, protein-calorie malnutrition, failure to thrive: BMI is 12. - Supplement protein as able by mouth (based on [place] discussions by myself and palliative care today)  Acute urinary retention: Possibly due to UTI.  - Foley catheter placed 1/21, will plan on voiding trial prior to discharge pending goals of care discussions.   Epiglottis CA s/p chemo and radiation: Cause of dysphagia.  - F/u with oncology per routine  Hyperkalemia.  Mild, resolved with hydration.  - Monitor  Macrocytic anemia of chronic disease and myelodysplastic syndrome:  - Continue folate, iron supplements. B12 level recently 514. - Follow up with oncology as outpatient.   Chronic HFrEF: EF relatively stable on repeat echo from 45-50% to 40-45%, limited by habitus and ability to cooperate with exam.  - Has some wall motion  irregularities on echo. No chest pain, no ischemic ECG changes, and troponin negative.  - Appears euvolemic/initially dehydrated. Will decrease IVF's, change to hypotonic. Feel he continues to require this as his po intake is sparse.   Seizure disorder:  - Continue keppra  COPD with mild exacerbation.   - Continue on oxygen, nebulizers -  Improving, so deescalate IV steroids to 40mg  q12h.  Mild acute kidney injury likely secondary to sepsis and volume depletion.  Improving. - Will closely monitor.  Patient received adequate fluid hydration.Check BMP in a.m.  Debility, generalized weakness.   - Will get physical therapy evaluation.  Stage I sacral pressure injury: POA - Offload as able  DVT prophylaxis: Heparin subcutaneous Code Status: DNR Family Communication: Wife at bedside Disposition Plan: Pending clinical trajectory. Guarded prognosis.  Consultants:   Palliative care medicine  Procedures:   Echocardiogram 05/30/2018: - Left ventricle: The cavity size was normal. Systolic function was   mildly to moderately reduced. The estimated ejection fraction was   in the range of 40% to 45%. Hypokinesis of the inferior and   inferoseptal myocardium. Doppler parameters are consistent with   abnormal left ventricular relaxation (grade 1 diastolic   dysfunction). - Aortic valve: Transvalvular velocity was within the normal range.   There was no stenosis. There was moderate regurgitation.   Regurgitation pressure half-time: 321 ms. - Mitral valve: Transvalvular velocity was within the normal range.   There was no evidence for stenosis. There was no regurgitation. - Right ventricle: The cavity size was normal. Wall thickness was   normal. Systolic function was normal. - Atrial septum: No defect or patent foramen ovale was identified. - Tricuspid valve: There was mild-moderate regurgitation. - Pulmonary arteries: Systolic pressure was mildly increased. PA   peak pressure: 47 mm Hg (S).  Impressions: - Study limited by patient body habitus and inability to cooperate   with exam.  Antimicrobials:  Vancomycin, cefepime, flagyl   Subjective: Pt reports feeling tired, weak, wants to eat if he can. No chest pain, dyspnea, or other complaints.   Objective: Vitals:   05/30/18 1500 05/30/18 1600 05/30/18 1700  05/30/18 2006  BP: 122/89  103/60   Pulse:   87   Resp: 20  15   Temp:  97.9 F (36.6 C)  97.6 F (36.4 C)  TempSrc:  Oral  Oral  SpO2:   98%   Weight:      Height:        Intake/Output Summary (Last 24 hours) at 05/30/2018 2219 Last data filed at 05/30/2018 2216 Gross per 24 hour  Intake 2427.24 ml  Output 925 ml  Net 1502.24 ml   Filed Weights   05/09/2018 0518 05/30/18 0453  Weight: 43.7 kg 42 kg    Gen: Cachectic male in no distress  Pulm: Non-labored breathing. Clear to auscultation bilaterally.  CV: Regular rate and rhythm. No murmur, rub, or gallop. No JVD, no pedal edema. GI: Abdomen soft, non-tender, non-distended, with normoactive bowel sounds. No organomegaly or masses felt. Ext: Warm, severe diffuse muscle wasting.  Skin: No rashes, lesions or ulcers Neuro: Alert, marginally oriented. No focal neurological deficits. Psych: Judgement and insight appear impaired. Mood & affect appropriate.   Data Reviewed: I have personally reviewed following labs and imaging studies  CBC: Recent Labs  Lab 05/23/2018 0500 05/28/2018 0757 05/30/18 0750  WBC 33.0* 44.7* 38.8*  NEUTROABS 31.2*  --   --   HGB 8.2* 8.1* 7.1*  HCT 26.7* 27.1* 23.2*  MCV 130.9*  133.5* 128.2*  PLT 292 257 740   Basic Metabolic Panel: Recent Labs  Lab 05/10/2018 0500 06/03/2018 0757 05/30/18 0750  NA 141  --  141  K 5.3*  --  4.2  CL 110  --  116*  CO2 19*  --  17*  GLUCOSE 98  --  116*  BUN 47*  --  37*  CREATININE 1.47* 1.28* 0.89  CALCIUM 8.2*  --  7.5*   GFR: Estimated Creatinine Clearance: 46.5 mL/min (by C-G formula based on SCr of 0.89 mg/dL). Liver Function Tests: Recent Labs  Lab 06/04/2018 0500  AST 35  ALT 23  ALKPHOS 80  BILITOT 0.7  PROT 7.7  ALBUMIN 2.8*   No results for input(s): LIPASE, AMYLASE in the last 168 hours. No results for input(s): AMMONIA in the last 168 hours. Coagulation Profile: No results for input(s): INR, PROTIME in the last 168 hours. Cardiac  Enzymes: No results for input(s): CKTOTAL, CKMB, CKMBINDEX, TROPONINI in the last 168 hours. BNP (last 3 results) No results for input(s): PROBNP in the last 8760 hours. HbA1C: No results for input(s): HGBA1C in the last 72 hours. CBG: No results for input(s): GLUCAP in the last 168 hours. Lipid Profile: No results for input(s): CHOL, HDL, LDLCALC, TRIG, CHOLHDL, LDLDIRECT in the last 72 hours. Thyroid Function Tests: No results for input(s): TSH, T4TOTAL, FREET4, T3FREE, THYROIDAB in the last 72 hours. Anemia Panel: No results for input(s): VITAMINB12, FOLATE, FERRITIN, TIBC, IRON, RETICCTPCT in the last 72 hours. Urine analysis:    Component Value Date/Time   COLORURINE YELLOW 06/05/2018 0528   APPEARANCEUR CLOUDY (A) 05/24/2018 0528   LABSPEC 1.018 06/01/2018 0528   LABSPEC 1.015 01/24/2007 0930   PHURINE 8.0 05/28/2018 0528   GLUCOSEU NEGATIVE 06/01/2018 0528   HGBUR SMALL (A) 05/28/2018 0528   BILIRUBINUR NEGATIVE 05/23/2018 0528   BILIRUBINUR Negative 01/24/2007 0930   KETONESUR NEGATIVE 05/13/2018 0528   PROTEINUR >=300 (A) 05/26/2018 0528   UROBILINOGEN 1.0 07/06/2013 0917   NITRITE NEGATIVE 06/01/2018 0528   LEUKOCYTESUR MODERATE (A) 06/03/2018 0528   LEUKOCYTESUR Negative 01/24/2007 0930   Recent Results (from the past 240 hour(s))  Blood Culture (routine x 2)     Status: None (Preliminary result)   Collection Time: 05/28/2018  5:00 AM  Result Value Ref Range Status   Specimen Description   Final    BLOOD RIGHT ANTECUBITAL Performed at [facility], [place] [address], [place], [place] [address]    Special Requests   Final    BOTTLES DRAWN AEROBIC AND ANAEROBIC Blood Culture adequate volume Performed at [facility] [address] [place], [place] [address]    Culture   Final    NO GROWTH 1 DAY Performed at [facility], [place] [address], [place], [place] [address]    Report Status PENDING  Incomplete  Blood Culture  (routine x 2)     Status: None (Preliminary result)   Collection Time: 06/01/2018  5:10 AM  Result Value Ref Range Status   Specimen Description   Final    BLOOD BLOOD LEFT FOREARM Performed at [facility] [address], [place], [place] [address]    Special Requests   Final    BOTTLES DRAWN AEROBIC AND ANAEROBIC Blood Culture results may not be optimal due to an excessive volume of blood received in culture bottles Performed at [facility] [address], [place],  [address]    Culture  Final    NO GROWTH 1 DAY Performed at [facility], [place] [address], [place], [place] [address]    Report Status PENDING  Incomplete  Urine culture     Status: Abnormal (Preliminary result)   Collection Time: 05/31/2018  5:28 AM  Result Value Ref Range Status   Specimen Description   Final    URINE, CATHETERIZED Performed at [facility] [address], [place], [place] [address]    Special Requests   Final    NONE Performed at [facility], [place] [address] [place], [place] [address]    Culture >=100,000 COLONIES/mL PROTEUS MIRABILIS (A)  Final   Report Status PENDING  Incomplete  Culture, blood (routine x 2) Call MD if unable to obtain prior to antibiotics being given     Status: None (Preliminary result)   Collection Time: 05/11/2018  7:57 AM  Result Value Ref Range Status   Specimen Description   Final    BLOOD RIGHT ANTECUBITAL Performed at [facility] [address], [place], [place] [address]    Special Requests   Final    BOTTLES DRAWN AEROBIC ONLY Blood Culture adequate volume Performed at [facility] [address], [place], [place] [address]    Culture   Final    NO GROWTH 1 DAY Performed at [facility], [place] [address] [place] [address], [place], [place] [address]    Report Status PENDING  Incomplete  Culture, blood (routine x 2) Call MD if unable to obtain  prior to antibiotics being given     Status: None (Preliminary result)   Collection Time: 05/12/2018  7:59 AM  Result Value Ref Range Status   Specimen Description   Final    BLOOD LEFT ARM Performed at [facility] [address], [place], [place] [address]    Special Requests   Final    BOTTLES DRAWN AEROBIC ONLY Blood Culture adequate volume Performed at [facility] [address] [place], [place] [address]    Culture   Final    NO GROWTH 1 DAY Performed at [facility], [place] [address] [place], [place] [address]    Report Status PENDING  Incomplete  MRSA PCR Screening     Status: None   Collection Time: 05/28/2018  8:03 AM  Result Value Ref Range Status   MRSA by PCR NEGATIVE NEGATIVE Final    Comment:        The GeneXpert MRSA Assay (FDA approved for NASAL specimens only), is one component of a comprehensive MRSA colonization surveillance program. It is not intended to diagnose MRSA infection nor to guide or monitor treatment for MRSA infections. Performed at [facility], [place] [address], [place], [place] [address]       Radiology Studies: Dg Chest Port 1 View  Result Date: 05/11/2018 CLINICAL DATA:  Initial evaluation for acute shortness of breath, fever. EXAM: PORTABLE CHEST 1 VIEW COMPARISON:  Prior radiograph from 05/23/2018 FINDINGS: Right-sided Port-A-Cath in place, stable. Cardiac and mediastinal silhouettes are unchanged, and may within normal limits. Lungs are hyperinflated with severe emphysematous changes. Approximate 2.7 cm spiculated nodule at the left upper lobe, corresponding with no bronchogenic carcinoma, stable. There are increased hazy opacities within the right mid lung as compared to previous, suspicious for infiltrates given history of fever. Irregular pleuroparenchymal scarring at the right upper lobe similar. Underlying interstitial congestion without overt pulmonary edema. No pleural  effusion.  No pneumothorax. No acute osseous finding. IMPRESSION: 1. Patchy and hazy opacity within the mid right lung, concerning for infectious infiltrate given the provided history of fever. 2. Grossly similar spiculated left upper lobe nodule, compatible with known bronchogenic carcinoma. 3. Underlying advanced emphysema. Electronically Signed   By: Jeannine Boga M.D.   On: 06/03/2018 05:51   Dg Swallowing Func-speech Pathology  Result Date: 05/21/2018 Objective Swallowing Evaluation: Type of Study: MBS-Modified Barium Swallow Study  Patient Details Name: ELISANDRO JARRETT MRN: [id number] Date of Birth: Jul 28, 1950 Today's Date: 06/07/2018 Time: SLP Start Time (ACUTE ONLY): 2956 -SLP Stop Time (ACUTE ONLY): 1359 SLP Time Calculation (min) (ACUTE ONLY): 53 min Past Medical History: Past Medical History: Diagnosis Date . Anxiety  . Arthritis   rheumatoid . Cancer ([place]) 03/11/2013  larnyx/epiglottic Squamous cell in situ . Emphysema of lung ([place])  . Esophageal reflux   not current . History of blood transfusion  . History of radiation therapy 05/13/2013-06/28/2013  68 gray to epiglottis/neck . History of radiation therapy 04/06/17-04/18/17  right SBRT lung 50 Gy in 5 fractions . Megaloblastic anemia  . Pneumonia 01/2013 . Seizures ([place])   last one was 8 years ago- 2008 . Shortness of breath   with exertion . Throat pain 06/13/2013 Past Surgical History: Past Surgical History: Procedure Laterality Date . ABDOMINAL HERNIA REPAIR   . COLONOSCOPY   . IR FLUORO GUIDE PORT INSERTION RIGHT  08/31/2016 . IR US GUIDE VASC ACCESS RIGHT  08/31/2016 . MINOR PLACEMENT OF FIDUCIAL  08/10/2016  Procedure: MINOR PLACEMENT OF FIDUCIAL x3;  Surgeon: Collene Gobble, MD;  Location: [facility];  Service: Thoracic;; . MULTIPLE EXTRACTIONS WITH ALVEOLOPLASTY N/A 04/16/2013  Procedure: Extraction of tooth #'s 2,5,6,7,10,11,15,17,21,22,27,29 with alveoloplasty, bilateral maxillary fibrous tuberosity reductions, removal of excess tissues of mandibular  arch, and mandibular left lingual exostoses reductions.;  Surgeon: Lenn Cal, DDS;  Location: [facility];  Service: Oral Surgery;  Laterality: N/A; . PEG PLACEMENT  2014 . tracheostomy  2014 . VIDEO BRONCHOSCOPY WITH ENDOBRONCHIAL NAVIGATION N/A 08/10/2016  Procedure: VIDEO BRONCHOSCOPY WITH ENDOBRONCHIAL NAVIGATION;  Surgeon: Collene Gobble, MD;  Location: [facility];  Service: Thoracic;  Laterality: N/A; HPI: 68 yo male adm to [facility] with AMS and fever, due to possible UTI and findings concerning for pna *right middle lobe per imaging study.  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  Pt has previously had a PEG and a bronch 08/10/2016.  He and wife deny pt seeing an SLP for dysphagia management with his pharyngeal cancer.  Also PMH with + seizure, myleodysplastic syndrome.  Cachexia per oncologist note with decreased ability to consume po.  Swallow evaluation ordered.  Pt has lost weight but has not had recent pna until now per his wife.  Wife reports pt having pill dysphagia and some difficulties with meats.  Subjective: pt awake in chair Assessment / Plan / Recommendation CHL IP CLINICAL IMPRESSIONS 06/02/2018 Clinical Impression Pt presents with moderate oral and gross pharyngeal = cervical esophageal dysphagia resulting in delayed oral transiting, piecemealing, lingual pumping and premature spillage.  Very poor pharyngeal/tongue base motility noted secondary to fibrosis from XRT in addition to ? absence or severely limited in size epiglottis.  Pt aspirated thin, nectar, honey due to delayed swallow, poor epiglottic deflection and decreased motility.  Barium spills over small epiglottis into open airway and aspiration was largely silent,  He also aspirated secretions that were retained  in pharynx and pyriform sinus- mixing with barium.  Aspiration occurred before, during and after the swallow - largely silent unless gross or reached  level of the carina.  Cued and reflexive cough did not clear aspirates due to weakness/inadequate vocal fold closure. Intermittently removed penetrates of liquids into pharynx where he re-aspirated from re-spillage into airway.   Various postures including head turn with and without chin tuck not helpful to decrease aspiration.  This patient is aspirating secretions at this point and this will not be prevented.  Nectar via tsp with less pharyngeal residuals due to decreased bolus size.  Pt with less residuals with pudding and solid than liquids  = presumed due to increased neuromuscular input prompting stronger pharyngeal swallow and increased duration of UES opening.   Pt advised SLP that he enjoys eating despite level of dysphagia/coughing with intake and there are not plans for a PEG tube placement again per pt.  SLP will educate family/pt regarding compensation strategies to mitigate aspiration.  Intake should be understood for comfort not nutritional support as pt's dysphagia, chronic aspiration prohibits this expectation.  With pt's current level of dysphagia, metastatic cancer s/p chemo, anticipate he will continue to lose weight with increased asp pna risk.  Highly recommend a palliative consult to establish goals of care/plan as the weaker this pt becomes the increased pulmonary ramification risks will be present. SLP Visit Diagnosis Dysphagia, pharyngeal phase (R13.13);Dysphagia, oropharyngeal phase (R13.12);Dysphagia, pharyngoesophageal phase (R13.14) Attention and concentration deficit following -- Frontal lobe and executive function deficit following -- Impact on safety and function Risk for inadequate nutrition/hydration;Severe aspiration risk   CHL IP TREATMENT RECOMMENDATION 05/13/2018 Treatment Recommendations F/U MBS in --- days (Comment)   Prognosis 05/25/2018 Prognosis for Safe Diet Advancement Fair Barriers to Reach Goals Severity of deficits;Behavior Barriers/Prognosis Comment -- CHL IP DIET  RECOMMENDATION 06/02/2018 SLP Diet Recommendations Other (Comment) Liquid Administration via Cup;Straw Medication Administration Crushed with puree Compensations Multiple dry swallows after each bite/sip;Other (Comment);Small sips/bites;Slow rate Postural Changes Remain semi-upright after after feeds/meals (Comment);Seated upright at 90 degrees   CHL IP OTHER RECOMMENDATIONS 05/30/2018 Recommended Consults -- Oral Care Recommendations Oral care BID Other Recommendations --   CHL IP FOLLOW UP RECOMMENDATIONS 05/23/2018 Follow up Recommendations (No Data)   CHL IP FREQUENCY AND DURATION 05/28/2018 Speech Therapy Frequency (ACUTE ONLY) min 1 x/week Treatment Duration 1 week      CHL IP ORAL PHASE 05/30/2018 Oral Phase Impaired Oral - Pudding Teaspoon -- Oral - Pudding Cup -- Oral - Honey Teaspoon -- Oral - Honey Cup Weak lingual manipulation;Lingual pumping;Reduced posterior propulsion;Decreased bolus cohesion Oral - Nectar Teaspoon Weak lingual manipulation;Lingual pumping;Reduced posterior propulsion;Decreased bolus cohesion Oral - Nectar Cup Weak lingual manipulation;Lingual pumping;Decreased bolus cohesion Oral - Nectar Straw -- Oral - Thin Teaspoon Weak lingual manipulation;Lingual pumping;Delayed oral transit;Decreased bolus cohesion Oral - Thin Cup Weak lingual manipulation;Lingual pumping;Reduced posterior propulsion;Delayed oral transit;Decreased bolus cohesion Oral - Thin Straw Weak lingual manipulation;Lingual pumping;Reduced posterior propulsion;Delayed oral transit;Decreased bolus cohesion Oral - Puree Weak lingual manipulation;Lingual pumping;Reduced posterior propulsion Oral - Mech Soft -- Oral - Regular -- Oral - Multi-Consistency -- Oral - Pill -- Oral Phase - Comment lingual pumping, delayed oral transiting, decreased bolus cohesion and residuals noted  CHL IP PHARYNGEAL PHASE 05/11/2018 Pharyngeal Phase Impaired Pharyngeal- Pudding Teaspoon -- Pharyngeal -- Pharyngeal- Pudding Cup -- Pharyngeal --  Pharyngeal- Honey Teaspoon Reduced pharyngeal peristalsis;Reduced epiglottic inversion;Reduced anterior laryngeal mobility;Reduced laryngeal elevation;Reduced airway/laryngeal closure;Reduced tongue base retraction;Penetration/Aspiration during swallow;Penetration/Apiration after swallow;Moderate aspiration;Pharyngeal residue - valleculae;Pharyngeal  residue - pyriform Pharyngeal Material enters airway, passes BELOW cords without attempt by patient to eject out (silent aspiration) Pharyngeal- Honey Cup -- Pharyngeal -- Pharyngeal- Nectar Teaspoon Reduced pharyngeal peristalsis;Reduced epiglottic inversion;Reduced anterior laryngeal mobility;Reduced laryngeal elevation;Reduced airway/laryngeal closure;Reduced tongue base retraction;Penetration/Aspiration during swallow;Penetration/Apiration after swallow;Moderate aspiration;Significant aspiration (Amount);Pharyngeal residue - valleculae;Pharyngeal residue - pyriform Pharyngeal Material enters airway, remains ABOVE vocal cords and not ejected out Pharyngeal- Nectar Cup -- Pharyngeal -- Pharyngeal- Nectar Straw Reduced pharyngeal peristalsis;Reduced epiglottic inversion;Reduced anterior laryngeal mobility;Reduced laryngeal elevation;Reduced airway/laryngeal closure;Reduced tongue base retraction;Penetration/Aspiration during swallow;Penetration/Apiration after swallow;Significant aspiration (Amount);Pharyngeal residue - valleculae;Pharyngeal residue - pyriform Pharyngeal Material enters airway, passes BELOW cords and not ejected out despite cough attempt by patient;Material enters airway, passes BELOW cords without attempt by patient to eject out (silent aspiration) Pharyngeal- Thin Teaspoon Reduced pharyngeal peristalsis;Reduced epiglottic inversion;Reduced anterior laryngeal mobility;Reduced laryngeal elevation;Reduced airway/laryngeal closure;Reduced tongue base retraction;Penetration/Aspiration during swallow;Penetration/Apiration after swallow;Moderate aspiration  Pharyngeal Material enters airway, passes BELOW cords without attempt by patient to eject out (silent aspiration) Pharyngeal- Thin Cup Reduced epiglottic inversion;Reduced anterior laryngeal mobility;Reduced laryngeal elevation;Reduced airway/laryngeal closure;Reduced tongue base retraction;Penetration/Aspiration during swallow;Penetration/Apiration after swallow;Moderate aspiration;Significant aspiration (Amount);Pharyngeal residue - valleculae;Pharyngeal residue - pyriform Pharyngeal Material enters airway, passes BELOW cords and not ejected out despite cough attempt by patient;Material enters airway, passes BELOW cords without attempt by patient to eject out (silent aspiration) Pharyngeal- Thin Straw Reduced pharyngeal peristalsis;Reduced epiglottic inversion;Reduced anterior laryngeal mobility;Reduced laryngeal elevation;Reduced airway/laryngeal closure;Reduced tongue base retraction;Penetration/Aspiration during swallow;Penetration/Apiration after swallow;Moderate aspiration;Pharyngeal residue - valleculae;Pharyngeal residue - pyriform Pharyngeal Material enters airway, passes BELOW cords and not ejected out despite cough attempt by patient;Material enters airway, passes BELOW cords without attempt by patient to eject out (silent aspiration) Pharyngeal- Puree Reduced pharyngeal peristalsis;Reduced epiglottic inversion;Reduced anterior laryngeal mobility;Reduced laryngeal elevation;Reduced airway/laryngeal closure;Reduced tongue base retraction;Penetration/Apiration after swallow;Pharyngeal residue - valleculae;Pharyngeal residue - pyriform Pharyngeal -- Pharyngeal- Mechanical Soft -- Pharyngeal -- Pharyngeal- Regular -- Pharyngeal -- Pharyngeal- Multi-consistency -- Pharyngeal -- Pharyngeal- Pill -- Pharyngeal -- Pharyngeal Comment chin tuck, head turn left/right with chin tuck did not decrease residuals, chin tuck worsened oral transiting delay  CHL IP CERVICAL ESOPHAGEAL PHASE 05/16/2018 Cervical Esophageal  Phase Impaired, upon esophageal sweep - distal esophagus appeared clear Pudding Teaspoon -- Pudding Cup -- Honey Teaspoon -- Honey Cup -- Nectar Teaspoon -- Nectar Cup -- Nectar Straw -- Thin Teaspoon -- Thin Cup -- Thin Straw -- Puree -- Mechanical Soft -- Regular -- Multi-consistency -- Pill -- Cervical Esophageal Comment -- Macario Golds 05/13/2018, 2:52 PM  Luanna Salk, MS [facility] SLP Acute Rehab Services Pager [phone] Office [phone]              Scheduled Meds: . Chlorhexidine Gluconate Cloth  6 each Topical Daily  . feeding supplement (ENSURE ENLIVE)  237 mL Oral BID BM  . ferrous sulfate  325 mg Oral Q breakfast  . folic acid  1 mg Oral Daily  . heparin  5,000 Units Subcutaneous Q8H  . levETIRAcetam  500 mg Oral BID  . mouth rinse  15 mL Mouth Rinse q12n4p  . methylPREDNISolone (SOLU-MEDROL) injection  60 mg Intravenous Q6H  . mirtazapine  7.5 mg Oral QHS  . multivitamin  15 mL Oral Daily  . sodium chloride flush  10-40 mL Intracatheter Q12H  . vitamin E  1,000 Units Oral Daily   Continuous Infusions: . sodium chloride 100 mL/hr at 05/30/18 1500  . ceFEPime (MAXIPIME) IV Stopped (05/30/18 2026)  . metronidazole Stopped (05/30/18 1625)  . sodium chloride  LOS: 1 day   Time spent: 35 minutes.  Patrecia Pour, MD [facility] [email or web address] Password [email or web address] 05/30/2018, 10:19 PM

## 2018-05-30 NOTE — Evaluation (Signed)
Physical Therapy Evaluation Patient Details Name: Adrian [name] MRN: [id number] DOB: [birth year] Today's Date: 05/30/2018   History of Present Illness  pt was admitted for HCAP.  H/O epiglottis CA, s/p XRT and chemo  Clinical Impression  Pt admitted as above and presenting with functional mobility limitations 2* generalized weakness and balance deficits.  Pt currently requiring significant assist of two for performance of all mobility tasks.  Pt would benefit from follow up rehab at SNF level to maximize IND and safety.    Follow Up Recommendations SNF    Equipment Recommendations  None recommended by PT    Recommendations for Other Services       Precautions / Restrictions Precautions Precautions: Fall Precaution Comments: watch BP Restrictions Weight Bearing Restrictions: No      Mobility  Bed Mobility Overal bed mobility: Needs Assistance Bed Mobility: Supine to Sit;Sit to Supine Rolling: Mod assist   Supine to sit: Mod assist;+2 for physical assistance;+2 for safety/equipment Sit to supine: Mod assist;+2 for physical assistance;+2 for safety/equipment   General bed mobility comments: Physical assist with bil LEs and to bring trunk to upright  Transfers                 General transfer comment: Pt agreeable to sit up on EOB only - fatigue limited  Ambulation/Gait                Stairs            Wheelchair Mobility    Modified Rankin (Stroke Patients Only)       Balance Overall balance assessment: Needs assistance Sitting-balance support: Bilateral upper extremity supported;Feet supported Sitting balance-Leahy Scale: Fair                                       Pertinent Vitals/Pain Pain Assessment: Faces Faces Pain Scale: No hurt    Home Living Family/patient expects to be discharged to:: Private residence Living Arrangements: Spouse/significant other Available Help at Discharge: Family Type of Home:  Apartment Home Access: Stairs to enter     Home Layout: One level Home Equipment: Environmental consultant - 2 wheels;Cane - single point;Bedside commode;Wheelchair - manual Additional Comments: pt unable to say how many steps into his home. Deferred further questions    Prior Function           Comments: unknown     Hand Dominance        Extremity/Trunk Assessment   Upper Extremity Assessment Upper Extremity Assessment: Generalized weakness    Lower Extremity Assessment Lower Extremity Assessment: Generalized weakness    Cervical / Trunk Assessment Cervical / Trunk Assessment: Kyphotic  Communication   Communication: Expressive difficulties(talked very little, very soft spoken)  Cognition Arousal/Alertness: Awake/alert Behavior During Therapy: WFL for tasks assessed/performed Overall Cognitive Status: Within Functional Limits for tasks assessed                                        General Comments      Exercises     Assessment/Plan    PT Assessment Patient needs continued PT services  PT Problem List Decreased strength;Decreased range of motion;Decreased activity tolerance;Decreased mobility;Decreased balance;Decreased knowledge of use of DME       PT Treatment Interventions DME instruction;Gait training;Functional mobility training;Therapeutic activities;Therapeutic exercise;Patient/family education;Balance training  PT Goals (Current goals can be found in the Care Plan section)  Acute Rehab PT Goals Patient Stated Goal: No goals expressed PT Goal Formulation: Patient unable to participate in goal setting Time For Goal Achievement: 06/13/18 Potential to Achieve Goals: Fair    Frequency Min 3X/week   Barriers to discharge        Co-evaluation PT/OT/SLP Co-Evaluation/Treatment: Yes Reason for Co-Treatment: For patient/therapist safety PT goals addressed during session: Mobility/safety with mobility OT goals addressed during session: ADL's and  self-care       AM-PAC PT "6 Clicks" Mobility  Outcome Measure Help needed turning from your back to your side while in a flat bed without using bedrails?: A Lot Help needed moving from lying on your back to sitting on the side of a flat bed without using bedrails?: A Lot Help needed moving to and from a bed to a chair (including a wheelchair)?: A Lot Help needed standing up from a chair using your arms (e.g., wheelchair or bedside chair)?: A Lot Help needed to walk in hospital room?: Total Help needed climbing 3-5 steps with a railing? : Total 6 Click Score: 10    End of Session Equipment Utilized During Treatment: Oxygen Activity Tolerance: Patient limited by fatigue Patient left: in bed;with call bell/phone within reach;with bed alarm set Nurse Communication: Mobility status PT Visit Diagnosis: Difficulty in walking, not elsewhere classified (R26.2);Muscle weakness (generalized) (M62.81)    Time: 0962-8366 PT Time Calculation (min) (ACUTE ONLY): 29 min   Charges:   PT Evaluation $PT Eval Low Complexity: 1 Low          [place] Pager [phone] Office [phone]   Myisha Pickerel 05/30/2018, 2:59 PM

## 2018-05-30 NOTE — Progress Notes (Signed)
  Echocardiogram 2D Echocardiogram has been performed.  Adrian [name] 05/30/2018, 12:48 PM

## 2018-05-30 NOTE — Progress Notes (Signed)
  Speech Language Pathology Treatment: Dysphagia  Patient Details Name: Adrian [name] MRN: [id number] DOB: 1951/04/04 Today's Date: 05/30/2018 Time: 1540-1610 SLP Time Calculation (min) (ACUTE ONLY): 30 min  Assessment / Plan / Recommendation Clinical Impression  SLP reviewed MBS study flouro loops with wife and pt providing detailed information regarding compensation strategies to decrease amount of aspiration with po intake.  Of note, pt previously had a significant pna and then received PEG, was then diagnosed with epiglottic cancer and underwent radiation tx.  Pt also admitted that he is weaker now than prior to lung cancer and chemo treatment.  Pt articulated he did not want a feeding tube and was informed to feeding tubes not preventing aspiration but providing nutrition.  Advised pt, wife to speak to MD regarding if  feeding tube could be beneficial with pt's current medical status.  Note palliative following up with pt and family regarding his care plan/goals.  SLP will order thickener for pt to use in drinks if desire and will follow up regarding swallowing.      HPI HPI: 68 yo male adm to [facility] with AMS and fever, due to possible UTI and findings concerning for pna *right middle lobe per imaging study.  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  Pt has previously had a PEG and a bronch 08/10/2016.  He and wife deny pt seeing an SLP for dysphagia management with his pharyngeal cancer.  Also PMH with + seizure, myleodysplastic syndrome.  Cachexia per oncologist note with decreased ability to consume po.  Swallow evaluation ordered.  Pt has lost weight but has not had recent pna until now per his wife.  Wife reports pt having pill dysphagia and some difficulties with meats.       SLP Plan  Continue with current plan of care       Recommendations  Diet recommendations: Dysphagia 3 (mechanical soft);Thin  liquid(consider only nectar thick liquids via tsp with meals and consume thin between meals to maximize comfort and diminish aspiration risk) Liquids provided via: Teaspoon;Cup;Straw Medication Administration: Crushed with puree(or crush with liquid) Supervision: Patient able to self feed Compensations: Slow rate;Small sips/bites;Multiple dry swallows after each bite/sip;Effortful swallow;Other (Comment)(hock and expectorate if able, stop and rest if pt coughing or short of breath) Postural Changes and/or Swallow Maneuvers: Seated upright 90 degrees;Out of bed for meals                Oral Care Recommendations: Oral care QID Follow up Recommendations: None SLP Visit Diagnosis: Dysphagia, oropharyngeal phase (R13.12);Dysphagia, pharyngoesophageal phase (R13.14) Plan: Continue with current plan of care       [place], Roseana Rhine Ann 05/30/2018, 10:11 PM  Luanna Salk, [facility] SLP Acute Rehab Services Pager [phone] Office [phone]

## 2018-05-30 NOTE — Consult Note (Signed)
Consultation Note Date: 05/30/2018   Patient Name: Adrian [name]  DOB: 12/08/1950  MRN: [id number]  Age / Sex: 68 y.o., male  PCP: [name], MD Referring Physician: Patrecia Pour, MD  Reason for Consultation: Establishing goals of care and Psychosocial/spiritual support  HPI/Patient Profile: 68 y.o. male   admitted on 05/24/2018 with past medical history of epiglottic cancer- status post radiation, lung mass status post radiation and chemotherapy in the past (last chemotherapy August 2019), myelodysplastic syndrome on low-dose prednisone, rheumatoid arthritis, seizure disorder, mild cognitive improvment followed by oncology Dr. Alvy Bimler, COPD not on oxygen, congestive heart failure ejection fraction of 45%, presented to the hospital with complaints of shortness of breath    ED, patient patient was tachycardic,tachypneic and hypertensive.He was febrile with a maximum temperature of 101.3 F.He was thought to have sepsis and was given 30 mls per KG IV fluid bolus. Pancultures were sent. Lactate on presentation was 4.5. WBC was elevated at 33,000. Arterial blood gas showed metabolic alkalosis from hyperventilation. Patient was noted to have infiltrate in the right middle lobe and urinalysis was abnormal. Patient was then considered for admission to the hospital for sepsis likely secondary to pneumonia and UTI.  Patient is frail, cachetic and failing to thrive.     Patient and family face treatment option decisions, advanced directive decisions and anticipatory care needs    Clinical Assessment and Goals of Care:   This NP Wadie Lessen reviewed medical records, received report from team, assessed the patient and then meet at the patient's bedside along with his wife  to discuss diagnosis, prognosis, GOC, EOL wishes disposition and options.  Concept of Hospice and Palliative Care were  discussed  A detailed discussion was had today regarding advanced directives.  Concepts specific to code status, artifical feeding and hydration, continued IV antibiotics and rehospitalization was had.  The difference between a aggressive medical intervention path  and a palliative comfort care path for this patient at this time was had.  Values and goals of care important to patient and family were attempted to be elicited.  We discussed the limitation of medical interventions when  a body begins to fail to trhive, and human mortality  MOST form introduced  Natural trajectory and expectations at EOL were discussed.  Questions and concerns addressed.   Family encouraged to call with questions or concerns.    PMT will continue to support holistically.   SUMMARY OF RECOMMENDATIONS    Code Status/Advance Care Planning:  DNR    Symptom Management:   Dysphagia: Risk of aspiration  Detailed discussion with wife today regarding risks and benefits of artificial feeding/PEG tube.  Discussed the risk of aspiration with or without feeding tube secondary to mouth secretions/saliva.  1 of the patient's only comfort at this time is his enjoyment of food.  Wife has made decision to allow for comfort feeds at this time  Palliative Prophylaxis:   Aspiration, Bowel Regimen, Delirium Protocol, Frequent Pain Assessment and Oral Care  Additional Recommendations (Limitations, Scope, Preferences):  Treat the  treatable and hope for improvement  No artificial feeding (PEG)now or in the future  Psycho-social/Spiritual:   Desire for further Chaplaincy support:no  Additional Recommendations: Education on Hospice  Prognosis:   < 3 months  Discharge Planning: To Be Determined      Primary Diagnoses: Present on Admission: . HCAP (healthcare-associated pneumonia) . Anemia . MDS (myelodysplastic syndrome), low grade ([place]) . COPD, mild ([place]) . Hyperkalemia . Mild cognitive impairment   I  have reviewed the medical record, interviewed the patient and family, and examined the patient. The following aspects are pertinent.  Past Medical History:  Diagnosis Date  . Anxiety   . Arthritis    rheumatoid  . Cancer ([place]) 03/11/2013   larnyx/epiglottic Squamous cell in situ  . Emphysema of lung ([place])   . Esophageal reflux    not current  . History of blood transfusion   . History of radiation therapy 05/13/2013-06/28/2013   68 gray to epiglottis/neck  . History of radiation therapy 04/06/17-04/18/17   right SBRT lung 50 Gy in 5 fractions  . Megaloblastic anemia   . Pneumonia 01/2013  . Seizures ([place])    last one was 8 years ago- 2008  . Shortness of breath    with exertion  . Throat pain 06/13/2013   Social History   Socioeconomic History  . Marital status: Married    Spouse name: Hassan Rowan  . Number of children: 3  . Years of education: 12th  . Highest education level: Not on file  Occupational History  . Occupation: Retired    Fish farm manager: OTHER  Social Needs  . Financial resource strain: Not on file  . Food insecurity:    Worry: Not on file    Inability: Not on file  . Transportation needs:    Medical: Not on file    Non-medical: Not on file  Tobacco Use  . Smoking status: Former Smoker    Packs/day: 0.12    Years: 40.00    Pack years: 4.80    Types: Cigarettes    Last attempt to quit: 10/04/2016    Years since quitting: 1.6  . Smokeless tobacco: Never Used  . Tobacco comment: smoke 1 to 2 a day  Substance and Sexual Activity  . Alcohol use: No    Alcohol/week: 0.0 standard drinks  . Drug use: No  . Sexual activity: Not on file  Lifestyle  . Physical activity:    Days per week: Not on file    Minutes per session: Not on file  . Stress: Not on file  Relationships  . Social connections:    Talks on phone: Not on file    Gets together: Not on file    Attends religious service: Not on file    Active member of club or organization: Not on file    Attends  meetings of clubs or organizations: Not on file    Relationship status: Not on file  Other Topics Concern  . Not on file  Social History Narrative   04/12/2013   The patient is married with 3 children. (2 girls and one boy)    The patient has a history of smoking a quarter pack per day for 15 years.   The patient is trying to quit on his own.   Patient has not had any alcohol for the past 6 months by report. Patient usually drinks vodka.   Caffeine Use: 2 cups daily  Family History  Problem Relation Age of Onset  . Hypertension Mother   . Arthritis/Rheumatoid Mother   . Stroke Mother    Scheduled Meds: . Chlorhexidine Gluconate Cloth  6 each Topical Daily  . feeding supplement (ENSURE ENLIVE)  237 mL Oral BID BM  . ferrous sulfate  325 mg Oral Q breakfast  . folic acid  1 mg Oral Daily  . heparin  5,000 Units Subcutaneous Q8H  . levETIRAcetam  500 mg Oral BID  . mouth rinse  15 mL Mouth Rinse BID  . methylPREDNISolone (SOLU-MEDROL) injection  60 mg Intravenous Q6H  . mirtazapine  7.5 mg Oral QHS  . multivitamin  15 mL Oral Daily  . sodium chloride flush  10-40 mL Intracatheter Q12H  . vitamin E  1,000 Units Oral Daily   Continuous Infusions: . sodium chloride 100 mL/hr at 05/30/18 0800  . ceFEPime (MAXIPIME) IV Stopped (05/30/18 0707)  . metronidazole Stopped (05/30/18 0554)  . sodium chloride    . vancomycin     PRN Meds:.ipratropium-albuterol, sodium chloride flush Medications Prior to Admission:  Prior to Admission medications   Medication Sig Start Date End Date Taking? Authorizing Provider  ferrous sulfate 325 (65 FE) MG tablet Take 325 mg by mouth daily with breakfast.   Yes [provider]  folic acid (FOLVITE) 1 MG tablet Take 1 mg by mouth daily.   Yes [provider]  levETIRAcetam (KEPPRA) 500 MG tablet Take 1 tablet (500 mg total) by mouth 2 (two) times daily. 01/15/18  Yes Garvin Fila, MD  mirtazapine (REMERON) 7.5 MG tablet  Take 1 tablet (7.5 mg total) by mouth at bedtime. 05/04/18  Yes Tanner, Lyndon Code., PA-C  predniSONE (DELTASONE) 20 MG tablet Take 1 tablet (20 mg total) by mouth daily with breakfast. 02/14/18  Yes Gorsuch, Ni, MD  vitamin E 1000 UNIT capsule Take 1,000 Units by mouth daily.    Yes [provider]  albuterol (PROVENTIL HFA;VENTOLIN HFA) 108 (90 Base) MCG/ACT inhaler Inhale 2 puffs into the lungs every 4 (four) hours as needed for wheezing or shortness of breath. Patient not taking: Reported on 05/09/2018 11/04/16   Collene Gobble, MD  Glycopyrrolate-Formoterol (BEVESPI AEROSPHERE) 9-4.8 MCG/ACT AERO Inhale 2 puffs into the lungs 2 (two) times daily. Patient not taking: Reported on 06/07/2018 11/04/16   Collene Gobble, MD   Allergies  Allergen Reactions  . No Known Allergies    Review of Systems  Unable to perform ROS: Acuity of condition    Physical Exam Constitutional:      Appearance: He is cachectic. He is ill-appearing.     Interventions: Nasal cannula in place.  Pulmonary:     Breath sounds: Decreased breath sounds present.  Musculoskeletal:     Comments: Generalized weakness and muscle atrophy  Skin:    General: Skin is warm and dry.     Vital Signs: BP (!) 93/57   Pulse 88   Temp 97.6 F (36.4 C) (Oral)   Resp 19   Ht 6\' 1"  (1.854 m)   Wt 42 kg   SpO2 98%   BMI 12.22 kg/m  Pain Scale: 0-10   Pain Score: 0-No pain   SpO2: SpO2: 98 % O2 Device:SpO2: 98 % O2 Flow Rate: .O2 Flow Rate (L/min): 4 L/min  IO: Intake/output summary:   Intake/Output Summary (Last 24 hours) at 05/30/2018 0854 Last data filed at 05/30/2018 0800 Gross per 24 hour  Intake 2621.31 ml  Output 1000 ml  Net 1621.31 ml    LBM: Last BM Date: (Not known) Baseline Weight: Weight: 43.7 kg Most recent weight: Weight: 42 kg     Palliative Assessment/Data: 30 % at best   Discussed with Dr Bonner Puna  Time In: 0830 Time Out: 1000 Time Total: 90 minutes Greater than 50%  of this time was  spent counseling and coordinating care related to the above assessment and plan.  Signed by: Wadie Lessen, NP   Please contact Palliative Medicine Team phone at [phone] for questions and concerns.  For individual provider: See Shea Evans

## 2018-05-30 NOTE — Evaluation (Signed)
Occupational Therapy Evaluation Patient Details Name: Adrian [name] MRN: [id number] DOB: 12-07-1950 Today's Date: 05/30/2018    History of Present Illness pt was admitted for HCAP.  H/O epiglottis CA, s/p XRT and chemo   Clinical Impression   This 68 year old man was admitted for the above.  Unsure of PLOF; pt unable to state. Will follow in acute setting with the goals listed below to increase strength and participation in adls.  Pt was orthostatic during eval when he sat up--see vitals section of chart.  He was asymptomatic    Follow Up Recommendations  SNF    Equipment Recommendations  (to be further assessed)    Recommendations for Other Services       Precautions / Restrictions Precautions Precautions: Fall Precaution Comments: watch BP Restrictions Weight Bearing Restrictions: No      Mobility Bed Mobility Overal bed mobility: Needs Assistance Bed Mobility: Supine to Sit;Sit to Supine Rolling: Mod assist   Supine to sit: Mod assist;+2 for physical assistance;+2 for safety/equipment Sit to supine: Mod assist;+2 for physical assistance;+2 for safety/equipment   General bed mobility comments: Physical assist with bil LEs and to bring trunk to upright  Transfers                 General transfer comment: Pt agreeable to sit up on EOB only - fatigue limited    Balance Overall balance assessment: Needs assistance Sitting-balance support: Bilateral upper extremity supported;Feet supported Sitting balance-Leahy Scale: Fair                                     ADL either performed or assessed with clinical judgement   ADL Overall ADL's : Needs assistance/impaired Eating/Feeding: Set up                                     General ADL Comments: total A for all adls, except for self feeding; +2 for LB/toileting from bed level. Pt did not hold yonker when offered.  He leaned head towards it     Vision         Perception      Praxis      Pertinent Vitals/Pain Pain Assessment: Faces Faces Pain Scale: No hurt     Hand Dominance     Extremity/Trunk Assessment Upper Extremity Assessment Upper Extremity Assessment: Generalized weakness   Lower Extremity Assessment Lower Extremity Assessment: Generalized weakness   Cervical / Trunk Assessment Cervical / Trunk Assessment: Kyphotic   Communication Communication Communication: Other (comment)(very little verbalization)   Cognition Arousal/Alertness: Awake/alert Behavior During Therapy: WFL for tasks assessed/performed Overall Cognitive Status: Within Functional Limits for tasks assessed                                     General Comments       Exercises     Shoulder Instructions      Home Living Family/patient expects to be discharged to:: Private residence Living Arrangements: Spouse/significant other Available Help at Discharge: Family Type of Home: Apartment Home Access: Stairs to enter     Home Layout: One level               Home Equipment: Environmental consultant - 2 wheels;Cane - single point;Bedside commode;Wheelchair -  manual   Additional Comments: pt unable to say how many steps into his home. Deferred further questions      Prior Functioning/Environment          Comments: unknown        OT Problem List: Decreased strength;Decreased activity tolerance;Decreased cognition;Decreased knowledge of use of DME or AE;Cardiopulmonary status limiting activity(standing balance NT)      OT Treatment/Interventions: Self-care/ADL training;Therapeutic exercise;DME and/or AE instruction;Therapeutic activities;Cognitive remediation/compensation;Patient/family education    OT Goals(Current goals can be found in the care plan section) Acute Rehab OT Goals Patient Stated Goal: No goals expressed OT Goal Formulation: With patient(general strengthening) Time For Goal Achievement: 06/13/18 Potential to Achieve Goals: Fair ADL  Goals Pt Will Transfer to Toilet: with mod assist;with +2 assist;bedside commode;stand pivot transfer Additional ADL Goal #1: Pt will roll to bil sides with min A for adls Additional ADL Goal #2: pt will perform 10 reps AAROM to bil UEs x 2 motions to increase strength for adls  OT Frequency: Min 2X/week   Barriers to D/C:            Co-evaluation   Reason for Co-Treatment: For patient/therapist safety PT goals addressed during session: Mobility/safety with mobility OT goals addressed during session: ADL's and self-care      AM-PAC OT "6 Clicks" Daily Activity     Outcome Measure Help from another person eating meals?: A Little Help from another person taking care of personal grooming?: Total Help from another person toileting, which includes using toliet, bedpan, or urinal?: Total Help from another person bathing (including washing, rinsing, drying)?: Total Help from another person to put on and taking off regular upper body clothing?: Total Help from another person to put on and taking off regular lower body clothing?: Total 6 Click Score: 8   End of Session    Activity Tolerance: Patient limited by fatigue Patient left: in bed;with call bell/phone within reach;with bed alarm set  OT Visit Diagnosis: Muscle weakness (generalized) (M62.81)                Time: 0712-1975 OT Time Calculation (min): 28 min Charges:  OT General Charges $OT Visit: 1 Visit OT Evaluation $OT Eval Low Complexity: 1 Low  Adrian [name], OTR/L Acute Rehabilitation Services [phone] [facility] pager [phone] office 05/30/2018  Adrian [name] 05/30/2018, 3:43 PM

## 2018-05-31 DIAGNOSIS — Z515 Encounter for palliative care: Secondary | ICD-10-CM

## 2018-05-31 DIAGNOSIS — Z66 Do not resuscitate: Secondary | ICD-10-CM

## 2018-05-31 LAB — HEMOGLOBIN AND HEMATOCRIT, BLOOD
HCT: 29.8 % — ABNORMAL LOW (ref 39.0–52.0)
Hemoglobin: 9.1 g/dL — ABNORMAL LOW (ref 13.0–17.0)

## 2018-05-31 LAB — BASIC METABOLIC PANEL
Anion gap: 7 (ref 5–15)
BUN: 41 mg/dL — ABNORMAL HIGH (ref 8–23)
CO2: 17 mmol/L — ABNORMAL LOW (ref 22–32)
Calcium: 7.9 mg/dL — ABNORMAL LOW (ref 8.9–10.3)
Chloride: 119 mmol/L — ABNORMAL HIGH (ref 98–111)
Creatinine, Ser: 0.92 mg/dL (ref 0.61–1.24)
GFR calc non Af Amer: >60 mL/min (ref >60)
Glucose, Bld: 146 mg/dL — ABNORMAL HIGH (ref 70–99)
Potassium: 4.1 mmol/L (ref 3.5–5.1)
Sodium: 143 mmol/L (ref 135–145)

## 2018-05-31 LAB — URINE CULTURE: Culture: >=100000 — AB

## 2018-05-31 LAB — CBC
HCT: 22.7 % — ABNORMAL LOW (ref 39.0–52.0)
Hemoglobin: 6.9 g/dL — CL (ref 13.0–17.0)
MCH: 39.2 pg — ABNORMAL HIGH (ref 26.0–34.0)
MCHC: 30.4 g/dL (ref 30.0–36.0)
MCV: 129 fL — ABNORMAL HIGH (ref 80.0–100.0)
Platelets: 246 10*3/uL (ref 150–400)
RBC: 1.76 MIL/uL — ABNORMAL LOW (ref 4.22–5.81)
RDW: 21.7 % — ABNORMAL HIGH (ref 11.5–15.5)
WBC: 26.7 10*3/uL — ABNORMAL HIGH (ref 4.0–10.5)
nRBC: 0 % (ref 0.0–0.2)

## 2018-05-31 LAB — PREPARE RBC (CROSSMATCH)

## 2018-05-31 LAB — VITAMIN B12: Vitamin B-12: 982 pg/mL — ABNORMAL HIGH (ref 180–914)

## 2018-05-31 MED ORDER — SODIUM CHLORIDE 0.9% IV SOLUTION
Freq: Once | INTRAVENOUS | Status: AC
  Administered 2018-05-31: 14:00:00 via INTRAVENOUS

## 2018-05-31 NOTE — Progress Notes (Signed)
Pt. Hgb=6.9 1 U of PRBC ordered. Type & Screen done & blood ready for transfusion. Pt. would like to wait for wife to come to the hospital to give consent for blood. Will continue to monitor.

## 2018-05-31 NOTE — Progress Notes (Signed)
CRITICAL VALUE ALERT  Critical Value: hgb 6.9 Date & Time Notied:  05/31/2018 0454  Provider Notified: Baltazar Najjar  Orders Received/Actions taken: awaiting orders

## 2018-05-31 NOTE — Progress Notes (Signed)
PROGRESS NOTE  Adrian [name]  YWV:[id number] DOB: [birth year] DOA: 05/14/2018 PCP: [name], MD   Brief Narrative: Adrian [name] is a 68 y.o. male with past medical history of epiglottic cancer status post radiation, lung mass status post radiation and chemotherapy in the past (last chemotherapy August 2019), myelodysplastic syndrome on low-dose prednisone, rheumatoid arthritis, seizure disorder followed by oncology Dr. Alvy Bimler, COPD not on oxygen, congestive heart failure ejection fraction of 45%, presented to the hospital with complaints of shortness of breath for almost 3 hours prior to arrival to the ED. EMS was called in and patient was noted to be tachycardic, tachypneic with saturation in the 80s and was noted to be febrile. Patient was put on nonrebreather mask prior to arrival to the hospital.  Patient complained of fever at home but denied any runny nose,  congestion chest pain or sputum production.  He does have mild cough.  Patient states that he occasionally has some difficulty swallowing and used to have a PEG tube in the past.  Patient denies any recent travel or sick contacts.  Denies any nausea, vomiting or abdominal pain.  Denies any diarrhea or constipation.  Patient denies any urinary urgency, frequency or dysuria but had urinary retention in the ED and required a Foley catheter.  Patient used to see pulmonary in the past but has not seen him recently and he is not on any inhalers.  ED Course: In the ED, patient patient was tachycardic, tachypneic and hypertensive.  He was febrile with a maximum temperature of 101.85F.  He was thought to have sepsis and was given 30 mls per KG IV fluid bolus.   Pancultures were sent.  Lactate on presentation was 4.5.  WBC was elevated at 33,000.  Arterial blood gas showed metabolic alkalosis from hyperventilation.  Patient was noted to have infiltrate in the right middle lobe and urinalysis was abnormal.  Patient was then considered for  admission to the hospital for sepsis likely secondary to pneumonia and UTI.  Assessment & Plan: Principal Problem:   Pneumonia of right middle lobe due to infectious organism [facility]) Active Problems:   Anemia   MDS (myelodysplastic syndrome), low grade (HCC)   COPD, mild (HCC)   History of head and neck cancer   Hyperkalemia   Mild cognitive impairment   Dysphagia   Protein-calorie malnutrition, severe   Pressure injury of skin   Palliative care by specialist   DNR (do not resuscitate)  Sepsis to PNA and Proteus UTI:  - Continue broad antimicrobial coverage including anaerobic coverage for aspiration.  - Monitor culture data (blood x2 NGTD), leukocytosis improving (44k > 26k), will continue trending.  Dysphagia, aspiration pneumonia: Has had PEG tube in past, but would not want that now.  - Per [place] discussions, continue feedings for comfort as well as all other medical treatments.  - Continue anaerobic coverage with flagyl. Anticipate this to be a recurring and likely currently ongoing problem.  - Appreciate SLP evaluation.  Severe cachexia, protein-calorie malnutrition, failure to thrive: BMI is 12. - Supplement protein as able by mouth (based on [place] discussions by myself and palliative care, Discussed today with palliative consultant)  Acute urinary retention: Possibly due to UTI.  - Foley catheter placed 1/21, will plan on voiding trial prior to discharge pending goals of care discussions.   Epiglottis CA s/p chemo and radiation: Cause of dysphagia.  - F/u with oncology per routine  Hyperkalemia.  Mild, resolved with hydration.  - Monitor  Macrocytic  anemia of chronic disease and myelodysplastic syndrome:  - Suspect acute disease suppressing bone marrow activity, will give 1u PRBCs for hgb < 7 and monitor CBC daily.  - Continue folate, iron supplements. B12 level ok. - Follow up with oncology as outpatient.   Chronic HFrEF: EF relatively stable on repeat echo from  45-50% to 40-45%, limited by habitus and ability to cooperate with exam.  - Has some wall motion irregularities on echo. No chest pain, no ischemic ECG changes, and troponin negative.  - Appears euvolemic/initially dehydrated. Continues to require low level maintenance fluids.  Seizure disorder:  - Continue keppra  COPD with mild exacerbation.   - Continue on oxygen, nebulizers - If continues improvement, plan to deescalate steroids 1/24, continue with q12h IV steroids today. May muddy water regarding leukocytosis.  Mild acute kidney injury likely secondary to sepsis and volume depletion.  Improving. - Will closely monitor.  Patient received adequate fluid hydration. Check BMP in a.m.  Debility, generalized weakness.   - Will get physical therapy evaluation.  Stage I sacral pressure injury: POA - Offload as able  DVT prophylaxis: Heparin subcutaneous Code Status: DNR Family Communication: None at bedside this AM or on PM rounds. Disposition Plan: Pending clinical trajectory. Guarded prognosis.  Consultants:   Palliative care medicine  Procedures:   Echocardiogram 05/30/2018: - Left ventricle: The cavity size was normal. Systolic function was   mildly to moderately reduced. The estimated ejection fraction was   in the range of 40% to 45%. Hypokinesis of the inferior and   inferoseptal myocardium. Doppler parameters are consistent with   abnormal left ventricular relaxation (grade 1 diastolic   dysfunction). - Aortic valve: Transvalvular velocity was within the normal range.   There was no stenosis. There was moderate regurgitation.   Regurgitation pressure half-time: 321 ms. - Mitral valve: Transvalvular velocity was within the normal range.   There was no evidence for stenosis. There was no regurgitation. - Right ventricle: The cavity size was normal. Wall thickness was   normal. Systolic function was normal. - Atrial septum: No defect or patent foramen ovale was  identified. - Tricuspid valve: There was mild-moderate regurgitation. - Pulmonary arteries: Systolic pressure was mildly increased. PA   peak pressure: 47 mm Hg (S).  Impressions: - Study limited by patient body habitus and inability to cooperate   with exam.  Antimicrobials:  Vancomycin 1/21  Cefepime, flagyl 1/21 >>  Subjective: Eating a little better, which is his impression of eating maybe 20% of breakfast. Feels weak, denies dyspnea but still on supplemental oxygen.  Objective: Vitals:   05/31/18 1340 05/31/18 1355 05/31/18 1644 05/31/18 1954  BP:  116/71 130/82   Pulse: 72  (!) 114   Resp: 15 15 20    Temp:  98.7 F (37.1 C) (!) 97.4 F (36.3 C) 98.1 F (36.7 C)  TempSrc:  Oral  Oral  SpO2: 97%  97%   Weight:      Height:        Intake/Output Summary (Last 24 hours) at 05/31/2018 2005 Last data filed at 05/31/2018 1900 Gross per 24 hour  Intake 2021.47 ml  Output 1000 ml  Net 1021.47 ml   Filed Weights   05/25/2018 0518 05/30/18 0453 05/31/18 0430  Weight: 43.7 kg 42 kg 42.8 kg   Gen: Cachectic male in no distress Pulm: Nonlabored breathing 1LPM. Clear without crackles or wheezes. CV: Regular rate and rhythm. No murmur, rub, or gallop. No JVD, no dependent edema. GI: Abdomen soft,  non-tender, non-distended, with normoactive bowel sounds.  Ext: Warm, no deformities, decreased muscle bulk. Skin: No new rashes, lesions or ulcers on visualized skin. Neuro: Alert and oriented. No focal neurological deficits. Psych: Judgement and insight appear impaired. Mood euthymic & affect congruent. Behavior is appropriate.    Data Reviewed: I have personally reviewed following labs and imaging studies  CBC: Recent Labs  Lab 05/14/2018 0500 05/15/2018 0757 05/30/18 0750 05/31/18 0437  WBC 33.0* 44.7* 38.8* 26.7*  NEUTROABS 31.2*  --   --   --   HGB 8.2* 8.1* 7.1* 6.9*  HCT 26.7* 27.1* 23.2* 22.7*  MCV 130.9* 133.5* 128.2* 129.0*  PLT 292 257 268 017   Basic Metabolic  Panel: Recent Labs  Lab 06/01/2018 0500 05/28/2018 0757 05/30/18 0750 05/31/18 0437  NA 141  --  141 143  K 5.3*  --  4.2 4.1  CL 110  --  116* 119*  CO2 19*  --  17* 17*  GLUCOSE 98  --  116* 146*  BUN 47*  --  37* 41*  CREATININE 1.47* 1.28* 0.89 0.92  CALCIUM 8.2*  --  7.5* 7.9*   GFR: Estimated Creatinine Clearance: 45.9 mL/min (by C-G formula based on SCr of 0.92 mg/dL). Liver Function Tests: Recent Labs  Lab 05/27/2018 0500  AST 35  ALT 23  ALKPHOS 80  BILITOT 0.7  PROT 7.7  ALBUMIN 2.8*   No results for input(s): LIPASE, AMYLASE in the last 168 hours. No results for input(s): AMMONIA in the last 168 hours. Coagulation Profile: No results for input(s): INR, PROTIME in the last 168 hours. Cardiac Enzymes: No results for input(s): CKTOTAL, CKMB, CKMBINDEX, TROPONINI in the last 168 hours. BNP (last 3 results) No results for input(s): PROBNP in the last 8760 hours. HbA1C: No results for input(s): HGBA1C in the last 72 hours. CBG: No results for input(s): GLUCAP in the last 168 hours. Lipid Profile: No results for input(s): CHOL, HDL, LDLCALC, TRIG, CHOLHDL, LDLDIRECT in the last 72 hours. Thyroid Function Tests: No results for input(s): TSH, T4TOTAL, FREET4, T3FREE, THYROIDAB in the last 72 hours. Anemia Panel: Recent Labs    05/31/18 0542  VITAMINB12 982*   Urine analysis:    Component Value Date/Time   COLORURINE YELLOW 05/17/2018 0528   APPEARANCEUR CLOUDY (A) 05/10/2018 0528   LABSPEC 1.018 06/02/2018 0528   LABSPEC 1.015 01/24/2007 0930   PHURINE 8.0 05/31/2018 0528   GLUCOSEU NEGATIVE 05/15/2018 0528   HGBUR SMALL (A) 05/28/2018 0528   BILIRUBINUR NEGATIVE 06/08/2018 0528   BILIRUBINUR Negative 01/24/2007 0930   KETONESUR NEGATIVE 05/28/2018 0528   PROTEINUR >=300 (A) 05/24/2018 0528   UROBILINOGEN 1.0 07/06/2013 0917   NITRITE NEGATIVE 05/28/2018 0528   LEUKOCYTESUR MODERATE (A) 05/21/2018 0528   LEUKOCYTESUR Negative 01/24/2007 0930   Recent  Results (from the past 240 hour(s))  Blood Culture (routine x 2)     Status: None (Preliminary result)   Collection Time: 06/04/2018  5:00 AM  Result Value Ref Range Status   Specimen Description   Final    BLOOD RIGHT ANTECUBITAL Performed at [facility], [place] [address] [place], [place] [address]    Special Requests   Final    BOTTLES DRAWN AEROBIC AND ANAEROBIC Blood Culture adequate volume Performed at [facility] [address] [place],  [address]    Culture   Final    NO GROWTH 2 DAYS Performed at [facility] [address], [place], [place]  [address]    Report Status PENDING  Incomplete  Blood Culture (routine x 2)     Status: None (Preliminary result)   Collection Time: 05/27/2018  5:10 AM  Result Value Ref Range Status   Specimen Description   Final    BLOOD BLOOD LEFT FOREARM Performed at [facility] [address], [place], [place] [address]    Special Requests   Final    BOTTLES DRAWN AEROBIC AND ANAEROBIC Blood Culture results may not be optimal due to an excessive volume of blood received in culture bottles Performed at [facility] [address], [place], [place] [address]    Culture   Final    NO GROWTH 2 DAYS Performed at [facility] [address] [place], [place] [address]    Report Status PENDING  Incomplete  Urine culture     Status: Abnormal   Collection Time: 05/24/2018  5:28 AM  Result Value Ref Range Status   Specimen Description   Final    URINE, CATHETERIZED Performed at [facility] [address], [place], [place] [address]    Special Requests   Final    NONE Performed at [facility], [place] [address] [place], [place] [address]    Culture >=100,000 COLONIES/mL PROTEUS MIRABILIS (A)  Final   Report Status 05/31/2018 FINAL  Final   Organism ID, Bacteria PROTEUS MIRABILIS (A)  Final      Susceptibility    Proteus mirabilis - MIC*    AMPICILLIN <=2 SENSITIVE Sensitive     CEFAZOLIN <=4 SENSITIVE Sensitive     CEFTRIAXONE <=1 SENSITIVE Sensitive     CIPROFLOXACIN <=0.25 SENSITIVE Sensitive     GENTAMICIN >=16 RESISTANT Resistant     IMIPENEM 1 SENSITIVE Sensitive     NITROFURANTOIN 128 RESISTANT Resistant     TRIMETH/SULFA >=320 RESISTANT Resistant     AMPICILLIN/SULBACTAM <=2 SENSITIVE Sensitive     PIP/TAZO <=4 SENSITIVE Sensitive     * >=100,000 COLONIES/mL PROTEUS MIRABILIS  Culture, blood (routine x 2) Call MD if unable to obtain prior to antibiotics being given     Status: None (Preliminary result)   Collection Time: 06/05/2018  7:57 AM  Result Value Ref Range Status   Specimen Description   Final    BLOOD RIGHT ANTECUBITAL Performed at [facility] [address] [place], [place] [address]    Special Requests   Final    BOTTLES DRAWN AEROBIC ONLY Blood Culture adequate volume Performed at [facility] [address] [place], [place] [address]    Culture   Final    NO GROWTH 2 DAYS Performed at [facility] [address] [place], [place] [address]    Report Status PENDING  Incomplete  Culture, blood (routine x 2) Call MD if unable to obtain prior to antibiotics being given     Status: None (Preliminary result)   Collection Time: 05/09/2018  7:59 AM  Result Value Ref Range Status   Specimen Description   Final    BLOOD LEFT ARM Performed at [facility] [address] [place], [place] [address]    Special Requests   Final    BOTTLES DRAWN AEROBIC ONLY Blood Culture adequate volume Performed at [facility] [address], [place],  [address]    Culture   Final    NO GROWTH 2 DAYS Performed at [facility]  [address] [place], [place] [address]    Report Status PENDING  Incomplete  MRSA PCR Screening     Status: None   Collection Time: 05/14/2018  8:03 AM  Result  Value Ref Range Status   MRSA by PCR NEGATIVE NEGATIVE Final    Comment:        The GeneXpert MRSA Assay (FDA approved for NASAL specimens only), is one component of a comprehensive MRSA colonization surveillance program. It is not intended to diagnose MRSA infection nor to guide or monitor treatment for MRSA infections. Performed at [facility], [place] [address] [place], [place] [address]       Radiology Studies: No results found.  Scheduled Meds: . Chlorhexidine Gluconate Cloth  6 each Topical Daily  . feeding supplement (ENSURE ENLIVE)  237 mL Oral BID BM  . ferrous sulfate  325 mg Oral Q breakfast  . folic acid  1 mg Oral Daily  . heparin  5,000 Units Subcutaneous Q8H  . levETIRAcetam  500 mg Oral BID  . mouth rinse  15 mL Mouth Rinse q12n4p  . methylPREDNISolone (SOLU-MEDROL) injection  40 mg Intravenous Q12H  . mirtazapine  7.5 mg Oral QHS  . multivitamin  15 mL Oral Daily  . sodium chloride flush  10-40 mL Intracatheter Q12H  . vitamin E  1,000 Units Oral Daily   Continuous Infusions: . ceFEPime (MAXIPIME) IV 1 g (05/31/18 1858)  . dextrose 5 % and 0.45% NaCl Stopped (05/31/18 0620)  . metronidazole Stopped (05/31/18 1445)  . sodium chloride       LOS: 2 days   Time spent: 35 minutes.  Patrecia Pour, MD [facility] [email or web address] Password [facility] 05/31/2018, 8:05 PM

## 2018-05-31 NOTE — Progress Notes (Signed)
Patient ID: Adrian [name], male   DOB: [birth year], 68 y.o.   MRN: [id number]  This NP visited patient at the bedside as a follow up to  yesterday's [place] for palliative medicine needs and emotional support.  Patient is weak but awake and able to engage in limited conversation.  He denies any pain or discomfort at this time.  As discussed  yesterday patient and family wish to continue to treat the treatable and they are hopeful for improvement.  They are not considering artificial feeding at this time, open to comfort feeds with known risk of aspiration.  Attempted to call wife x2 did not return and was unable to leave a message.  This nurse practitioner will not be in the hospital through the weekend but will have coworker check on Mr. Boateng for palliative medicine support.  Patient is high risk for decompensation.  Once patient is stable for disposition would encourage hospice services at home, wife verbalized yesterday that she is not interested in any  skilled nursing facility. "  I will take him home".  Questions and concerns addressed   Discussed with Dr Bonner Puna  Total time spent on the unit was 20 minutes  Greater than 50% of the time was spent in counseling and coordination of care  Wadie Lessen NP  Palliative Medicine Team Team Phone # [phone] Pager [phone]

## 2018-06-01 DIAGNOSIS — J181 Lobar pneumonia, unspecified organism: Secondary | ICD-10-CM

## 2018-06-01 DIAGNOSIS — R64 Cachexia: Secondary | ICD-10-CM

## 2018-06-01 DIAGNOSIS — C3412 Malignant neoplasm of upper lobe, left bronchus or lung: Secondary | ICD-10-CM

## 2018-06-01 DIAGNOSIS — D469 Myelodysplastic syndrome, unspecified: Secondary | ICD-10-CM

## 2018-06-01 LAB — TYPE AND SCREEN
ABO/RH(D): O POS
Antibody Screen: NEGATIVE
Donor AG Type: NEGATIVE
Unit division: 0

## 2018-06-01 LAB — BASIC METABOLIC PANEL
ANION GAP: 8 (ref 5–15)
BUN: 37 mg/dL — ABNORMAL HIGH (ref 8–23)
CO2: 17 mmol/L — ABNORMAL LOW (ref 22–32)
Calcium: 8.3 mg/dL — ABNORMAL LOW (ref 8.9–10.3)
Chloride: 114 mmol/L — ABNORMAL HIGH (ref 98–111)
Creatinine, Ser: 0.81 mg/dL (ref 0.61–1.24)
GFR calc Af Amer: >60 mL/min (ref >60)
GFR calc non Af Amer: >60 mL/min (ref >60)
Glucose, Bld: 135 mg/dL — ABNORMAL HIGH (ref 70–99)
Potassium: 4.2 mmol/L (ref 3.5–5.1)
Sodium: 139 mmol/L (ref 135–145)

## 2018-06-01 LAB — FOLATE RBC
Folate, Hemolysate: 220 ng/mL
Folate, RBC: 1152 ng/mL (ref >498)
Hematocrit: 19.1 % — ABNORMAL LOW (ref 37.5–51.0)

## 2018-06-01 LAB — CBC
HCT: 29.3 % — ABNORMAL LOW (ref 39.0–52.0)
Hemoglobin: 9.1 g/dL — ABNORMAL LOW (ref 13.0–17.0)
MCH: 35.7 pg — ABNORMAL HIGH (ref 26.0–34.0)
MCHC: 31.1 g/dL (ref 30.0–36.0)
MCV: 114.9 fL — ABNORMAL HIGH (ref 80.0–100.0)
NRBC: 0 % (ref 0.0–0.2)
Platelets: 232 10*3/uL (ref 150–400)
RBC: 2.55 MIL/uL — ABNORMAL LOW (ref 4.22–5.81)
WBC: 21.1 10*3/uL — ABNORMAL HIGH (ref 4.0–10.5)

## 2018-06-01 LAB — BPAM RBC
Blood Product Expiration Date: 202002202359
ISSUE DATE / TIME: 202001231330
Unit Type and Rh: 5100

## 2018-06-01 MED ORDER — PREDNISONE 20 MG PO TABS
40.0000 mg | ORAL_TABLET | Freq: Every day | ORAL | Status: DC
  Administered 2018-06-02 – 2018-06-03 (×2): 40 mg via ORAL
  Filled 2018-06-01 (×2): qty 2

## 2018-06-01 MED ORDER — METHYLPREDNISOLONE SODIUM SUCC 40 MG IJ SOLR: 40.0000 mg | Freq: Every day | INTRAMUSCULAR | Status: DC

## 2018-06-01 NOTE — Progress Notes (Signed)
PROGRESS NOTE  Adrian [name]  NLZ:[id number] DOB: 09/17/1950 DOA: 05/16/2018 PCP: [name], MD   Brief Narrative: Adrian [name] is a 68 y.o. male with past medical history of epiglottic cancer status post radiation, lung mass status post radiation and chemotherapy in the past (last chemotherapy August 2019), myelodysplastic syndrome on low-dose prednisone, rheumatoid arthritis, seizure disorder followed by oncology Dr. Alvy Bimler, COPD not on oxygen, congestive heart failure ejection fraction of 45%, presented to the hospital with complaints of shortness of breath for almost 3 hours prior to arrival to the ED. EMS was called in and patient was noted to be tachycardic, tachypneic with saturation in the 80s and was noted to be febrile. Patient was put on nonrebreather mask prior to arrival to the hospital.  Patient complained of fever at home but denied any runny nose,  congestion chest pain or sputum production.  He does have mild cough.  Patient states that he occasionally has some difficulty swallowing and used to have a PEG tube in the past.  Patient denies any recent travel or sick contacts.  Denies any nausea, vomiting or abdominal pain.  Denies any diarrhea or constipation.  Patient denies any urinary urgency, frequency or dysuria but had urinary retention in the ED and required a Foley catheter.  Patient used to see pulmonary in the past but has not seen him recently and he is not on any inhalers.  ED Course: In the ED, patient patient was tachycardic, tachypneic and hypertensive.  He was febrile with a maximum temperature of 101.29F.  He was thought to have sepsis and was given 30 mls per KG IV fluid bolus.   Pancultures were sent.  Lactate on presentation was 4.5.  WBC was elevated at 33,000.  Arterial blood gas showed metabolic alkalosis from hyperventilation.  Patient was noted to have infiltrate in the right middle lobe and urinalysis was abnormal.  Patient was then considered for  admission to the hospital for sepsis likely secondary to pneumonia and UTI.  Assessment & Plan: Principal Problem:   Pneumonia of right middle lobe due to infectious organism [facility]) Active Problems:   Anemia   MDS (myelodysplastic syndrome), low grade (HCC)   COPD, mild (HCC)   History of head and neck cancer   Hyperkalemia   Mild cognitive impairment   Dysphagia   Protein-calorie malnutrition, severe   Pressure injury of skin   Palliative care by specialist   DNR (do not resuscitate)  Sepsis due to PNA and Proteus UTI:  - Continue broad antimicrobial coverage including anaerobic coverage for aspiration.  - Monitor culture data (blood x2 from 1/21 remain NGTD), leukocytosis improving (44k > 21k), will continue trending.  Dysphagia, aspiration pneumonia: Has had PEG tube in past, but would not want that now.  - Per [place] discussions, continue feedings for comfort as well as all other medical treatments.  - Continue anaerobic coverage with flagyl. Anticipate this to be a recurring and likely currently ongoing problem.  - Appreciate SLP evaluation.  Severe malignant cachexia, protein-calorie malnutrition, failure to thrive: BMI is 12. - Supplement protein as able by mouth (based on [place] discussions). - Gets prn IVF support at home.   Acute urinary retention: Possibly due to UTI.  - Foley catheter placed 1/21, will plan on voiding trial prior to discharge pending goals of care discussions.   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  - Dr. Alvy Bimler following patient. Has appointment 06/13/2018.  LUL, lingula squamous  cell lung carcinoma: Undergone chemotherapy, radiation, resection: Latest PET in Aug 2019 showed increased LUL nodule and mildly increase lingular nodules (x2) suspicious for metastases as well as unchange subcarinal LN consistent with metastasis.  - Per oncology, refractory to multiple treatments. Palliative/supportive plan of care pursued  with no further chemotherapy based on frailty.  Hyperkalemia.  Mild, resolved with hydration.  - Monitor  Macrocytic anemia of chronic disease and low grade myelodysplastic syndrome:  - Suspect acute disease suppressing bone marrow activity, gave 1u PRBCs 1/23 with appropriate rise. Per oncology, pt has been getting aranesp q2 weeks and transfusion support as needed, usually every 4-6 weeks. - Continue folate, iron supplements. B12 level ok. - Follow up with oncology as outpatient.   Chronic HFrEF: EF relatively stable on repeat echo from 45-50% to 40-45%, limited by habitus and ability to cooperate with exam.  - Has some wall motion irregularities on echo. No chest pain, no ischemic ECG changes, and troponin negative.  - Appears euvolemic/initially dehydrated. Continues to require low level maintenance fluids.  Seizure disorder:  - Continue keppra  Severe COPD with mild exacerbation.   - Continue on oxygen, nebulizers - On chronic steroids for this, so will need prolonged taper. Switch to po 1/25.  Mild acute kidney injury likely secondary to sepsis and volume depletion.  Improving. - Will closely monitor.  Patient received adequate fluid hydration. Check BMP in a.m.  Debility, generalized weakness.   - PT evaluation requested. Plan to discharge to home with ongoing home care.   Stage I sacral pressure injury: POA - Offload as able  DVT prophylaxis: Heparin subcutaneous Code Status: DNR Family Communication: Wife by phone today. Disposition Plan: Pending clinical trajectory. Guarded prognosis. Likely home in next   Consultants:   Palliative care medicine  Oncology  Procedures:   Echocardiogram 05/30/2018: - Left ventricle: The cavity size was normal. Systolic function was   mildly to moderately reduced. The estimated ejection fraction was   in the range of 40% to 45%. Hypokinesis of the inferior and   inferoseptal myocardium. Doppler parameters are consistent  with   abnormal left ventricular relaxation (grade 1 diastolic   dysfunction). - Aortic valve: Transvalvular velocity was within the normal range.   There was no stenosis. There was moderate regurgitation.   Regurgitation pressure half-time: 321 ms. - Mitral valve: Transvalvular velocity was within the normal range.   There was no evidence for stenosis. There was no regurgitation. - Right ventricle: The cavity size was normal. Wall thickness was   normal. Systolic function was normal. - Atrial septum: No defect or patent foramen ovale was identified. - Tricuspid valve: There was mild-moderate regurgitation. - Pulmonary arteries: Systolic pressure was mildly increased. PA   peak pressure: 47 mm Hg (S).  Impressions: - Study limited by patient body habitus and inability to cooperate   with exam.  Antimicrobials:  Vancomycin 1/21  Cefepime, flagyl 1/21 >>  Subjective: Breathing is improving, trouble with swallowing continues and though he reports improving appetite, RN states he's eating very little. No fevers, feels severely weak.  Objective: Vitals:   06/01/18 0900 06/01/18 1000 06/01/18 1100 06/01/18 1200  BP: 139/90 (!) 146/100 (!) 145/87 133/76  Pulse:    73  Resp: 20 18 (!) 25   Temp:    98.4 F (36.9 C)  TempSrc:    Oral  SpO2: 96% 98%  96%  Weight:      Height:        Intake/Output Summary (Last  24 hours) at 06/01/2018 1402 Last data filed at 06/01/2018 1100 Gross per 24 hour  Intake 2188.32 ml  Output 2250 ml  Net -61.68 ml   Filed Weights   05/30/18 0453 05/31/18 0430 06/01/18 0500  Weight: 42 kg 42.8 kg 43.3 kg   Gen: Cachectic male in no distress Pulm: Nonlabored. Clear. CV: Regular rate and rhythm. No murmur, rub, or gallop. No JVD, no dependent edema. GI: Abdomen soft, non-tender, non-distended, with normoactive bowel sounds.  Ext: Warm, no deformities Skin: No new rashes, lesions or ulcers on visualized skin. Neuro: Alert and oriented. No focal  neurological deficits. Psych: Judgement and insight appear fair. Mood euthymic & affect congruent. Behavior is appropriate.    Data Reviewed: I have personally reviewed following labs and imaging studies  CBC: Recent Labs  Lab 05/15/2018 0500 06/05/2018 0757 05/30/18 0750 05/31/18 0437 05/31/18 1915 06/01/18 0536  WBC 33.0* 44.7* 38.8* 26.7*  --  21.1*  NEUTROABS 31.2*  --   --   --   --   --   HGB 8.2* 8.1* 7.1* 6.9* 9.1* 9.1*  HCT 26.7* 27.1* 23.2* 22.7* 29.8* 29.3*  MCV 130.9* 133.5* 128.2* 129.0*  --  114.9*  PLT 292 257 268 246  --  562   Basic Metabolic Panel: Recent Labs  Lab 05/17/2018 0500 05/24/2018 0757 05/30/18 0750 05/31/18 0437 06/01/18 0536  NA 141  --  141 143 139  K 5.3*  --  4.2 4.1 4.2  CL 110  --  116* 119* 114*  CO2 19*  --  17* 17* 17*  GLUCOSE 98  --  116* 146* 135*  BUN 47*  --  37* 41* 37*  CREATININE 1.47* 1.28* 0.89 0.92 0.81  CALCIUM 8.2*  --  7.5* 7.9* 8.3*   GFR: Estimated Creatinine Clearance: 52.7 mL/min (by C-G formula based on SCr of 0.81 mg/dL). Liver Function Tests: Recent Labs  Lab 06/08/2018 0500  AST 35  ALT 23  ALKPHOS 80  BILITOT 0.7  PROT 7.7  ALBUMIN 2.8*   No results for input(s): LIPASE, AMYLASE in the last 168 hours. No results for input(s): AMMONIA in the last 168 hours. Coagulation Profile: No results for input(s): INR, PROTIME in the last 168 hours. Cardiac Enzymes: No results for input(s): CKTOTAL, CKMB, CKMBINDEX, TROPONINI in the last 168 hours. BNP (last 3 results) No results for input(s): PROBNP in the last 8760 hours. HbA1C: No results for input(s): HGBA1C in the last 72 hours. CBG: No results for input(s): GLUCAP in the last 168 hours. Lipid Profile: No results for input(s): CHOL, HDL, LDLCALC, TRIG, CHOLHDL, LDLDIRECT in the last 72 hours. Thyroid Function Tests: No results for input(s): TSH, T4TOTAL, FREET4, T3FREE, THYROIDAB in the last 72 hours. Anemia Panel: Recent Labs    05/31/18 0542    VITAMINB12 982*   Urine analysis:    Component Value Date/Time   COLORURINE YELLOW 06/05/2018 0528   APPEARANCEUR CLOUDY (A) 05/25/2018 0528   LABSPEC 1.018 05/28/2018 0528   LABSPEC 1.015 01/24/2007 0930   PHURINE 8.0 05/27/2018 0528   GLUCOSEU NEGATIVE 05/12/2018 0528   HGBUR SMALL (A) 05/15/2018 0528   BILIRUBINUR NEGATIVE 05/09/2018 0528   BILIRUBINUR Negative 01/24/2007 0930   KETONESUR NEGATIVE 05/13/2018 0528   PROTEINUR >=300 (A) 06/07/2018 0528   UROBILINOGEN 1.0 07/06/2013 0917   NITRITE NEGATIVE 05/19/2018 0528   LEUKOCYTESUR MODERATE (A) 05/28/2018 0528   LEUKOCYTESUR Negative 01/24/2007 0930   Recent Results (from the past 240 hour(s))  Blood Culture (  routine x 2)     Status: None (Preliminary result)   Collection Time: 05/19/2018  5:00 AM  Result Value Ref Range Status   Specimen Description   Final    BLOOD RIGHT ANTECUBITAL Performed at [facility] [address], [place], [place] [address]    Special Requests   Final    BOTTLES DRAWN AEROBIC AND ANAEROBIC Blood Culture adequate volume Performed at [facility] [address], [place], [place] [address]    Culture   Final    NO GROWTH 2 DAYS Performed at [facility] [address] [place], [place] [address]    Report Status PENDING  Incomplete  Blood Culture (routine x 2)     Status: None (Preliminary result)   Collection Time: 06/07/2018  5:10 AM  Result Value Ref Range Status   Specimen Description   Final    BLOOD BLOOD LEFT FOREARM Performed at [facility] [address] [place], [place] [address]    Special Requests   Final    BOTTLES DRAWN AEROBIC AND ANAEROBIC Blood Culture results may not be optimal due to an excessive volume of blood received in culture bottles Performed at [facility] [address], [place], [place] [address]    Culture   Final    NO GROWTH 2 DAYS Performed at [facility] [address] [place], [place] [address]    Report Status PENDING  Incomplete  Urine culture     Status: Abnormal   Collection Time: 05/20/2018  5:28 AM  Result Value Ref Range Status   Specimen Description   Final    URINE, CATHETERIZED Performed at [facility] [address], [place], [place] [address]    Special Requests   Final    NONE Performed at [facility], [place] [address] [place], [place] [address]    Culture >=100,000 COLONIES/mL PROTEUS MIRABILIS (A)  Final   Report Status 05/31/2018 FINAL  Final   Organism ID, Bacteria PROTEUS MIRABILIS (A)  Final      Susceptibility   Proteus mirabilis - MIC*    AMPICILLIN <=2 SENSITIVE Sensitive     CEFAZOLIN <=4 SENSITIVE Sensitive     CEFTRIAXONE <=1 SENSITIVE Sensitive     CIPROFLOXACIN <=0.25 SENSITIVE Sensitive     GENTAMICIN >=16 RESISTANT Resistant     IMIPENEM 1 SENSITIVE Sensitive     NITROFURANTOIN 128 RESISTANT Resistant     TRIMETH/SULFA >=320 RESISTANT Resistant     AMPICILLIN/SULBACTAM <=2 SENSITIVE Sensitive     PIP/TAZO <=4 SENSITIVE Sensitive     * >=100,000 COLONIES/mL PROTEUS MIRABILIS  Culture, blood (routine x 2) Call MD if unable to obtain prior to antibiotics being given     Status: None (Preliminary result)   Collection Time: 06/02/2018  7:57 AM  Result Value Ref Range Status   Specimen Description   Final    BLOOD RIGHT ANTECUBITAL Performed at [facility] [address] [place], [place] [address]    Special Requests   Final    BOTTLES DRAWN AEROBIC ONLY Blood Culture adequate volume Performed at [facility] [address], [place], [place] [address]    Culture   Final    NO GROWTH 2 DAYS Performed at [facility] [address], [place], [place] [address]    Report Status PENDING  Incomplete  Culture, blood (routine x 2) Call MD if  unable to obtain prior to antibiotics being given     Status: None (Preliminary  result)   Collection Time: 05/25/2018  7:59 AM  Result Value Ref Range Status   Specimen Description   Final    BLOOD LEFT ARM Performed at [facility] [address] [place], [place] [address]    Special Requests   Final    BOTTLES DRAWN AEROBIC ONLY Blood Culture adequate volume Performed at [facility] [address], [place], [place] [address]    Culture   Final    NO GROWTH 2 DAYS Performed at [facility] [address], [place], [place] [address]    Report Status PENDING  Incomplete  MRSA PCR Screening     Status: None   Collection Time: 06/02/2018  8:03 AM  Result Value Ref Range Status   MRSA by PCR NEGATIVE NEGATIVE Final    Comment:        The GeneXpert MRSA Assay (FDA approved for NASAL specimens only), is one component of a comprehensive MRSA colonization surveillance program. It is not intended to diagnose MRSA infection nor to guide or monitor treatment for MRSA infections. Performed at [facility], [place] [address] [place], [place] [address]       Radiology Studies: No results found.  Scheduled Meds: . Chlorhexidine Gluconate Cloth  6 each Topical Daily  . feeding supplement (ENSURE ENLIVE)  237 mL Oral BID BM  . ferrous sulfate  325 mg Oral Q breakfast  . folic acid  1 mg Oral Daily  . heparin  5,000 Units Subcutaneous Q8H  . levETIRAcetam  500 mg Oral BID  . mouth rinse  15 mL Mouth Rinse q12n4p  . [START ON 06/02/2018] methylPREDNISolone (SOLU-MEDROL) injection  40 mg Intravenous Daily  . mirtazapine  7.5 mg Oral QHS  . multivitamin  15 mL Oral Daily  . sodium chloride flush  10-40 mL Intracatheter Q12H  . vitamin E  1,000 Units Oral Daily   Continuous Infusions: . ceFEPime (MAXIPIME) IV Stopped (06/01/18 0604)  . dextrose 5 % and 0.45% NaCl 60 mL/hr at 06/01/18 1212  . metronidazole 500 mg (06/01/18 1332)  . sodium chloride       LOS: 3 days   Time spent: 25 minutes.  Patrecia Pour, MD [facility] [email or web address] Password [email or web address] 06/01/2018, 2:02 PM

## 2018-06-01 NOTE — Progress Notes (Signed)
OT Cancellation Note  Patient Details Name: Adrian [name] MRN: [id number] DOB: 1951/04/29   Cancelled Treatment:    Reason Eval/Treat Not Completed: Fatigue/lethargy limiting ability to participate  [place] 06/01/2018, 11:50 AM  Lesle Chris, OTR/L Acute Rehabilitation Services [phone] [place] pager [phone] office 06/01/2018

## 2018-06-01 NOTE — Progress Notes (Signed)
Adrian [name] [name]   DOB:04-27-1951   IR#:[id number]    Assessment & Plan:   Cancer of upper lobe of left lung [facility]) The patient has not received chemotherapy for almost 6 months.  We are doing supportive care only.  Possible COPD exacerbation/pneumonia His symptoms has improved with oxygen therapy and antibiotics along with steroids.  Will defer to primary service  Malignant cachexia [facility]) He has malignant cachexia and have difficulties with oral fluid intake I have noticed evaluation by speech and language therapist about risk of aspiration.  His overall prognosis is poor and I do not feel strongly that placing a feeding tube long-term is indicated  MDS (myelodysplastic syndrome), low grade ([place]) He has received a unit of blood transfusion since admission. He will continue close follow-up with repeat blood work and darbepoetin injection or transfusion support as needed, next appointment in my office is 06/13/2018  Discharge planning Will defer to primary service.  Please call if questions arise.  [name], MD 06/01/2018  8:24 AM   Subjective:  I met with the patient this morning.  He feels better since admission.  He has less cough.  Objective:  Vitals:   06/01/18 0600 06/01/18 0700  BP: 128/78 139/74  Pulse: 81 72  Resp: 20 20  Temp:    SpO2: 97% 97%     Intake/Output Summary (Last 24 hours) at 06/01/2018 0824 Last data filed at 06/01/2018 0600 Gross per 24 hour  Intake 1890.73 ml  Output 1550 ml  Net 340.73 ml    GENERAL:alert, no distress and comfortable. He looks thin and cachectic LUNGS: clear to auscultation and percussion with normal breathing effort HEART: regular rate & rhythm and no murmurs and no lower extremity edema NEURO: alert & oriented x 3 with fluent speech, no focal motor/sensory deficits   Labs:  Lab Results  Component Value Date   WBC 21.1 (H) 06/01/2018   HGB 9.1 (L) 06/01/2018   HCT 29.3 (L) 06/01/2018   MCV 114.9 (H) 06/01/2018   PLT 232  06/01/2018   NEUTROABS 31.2 (H) 05/11/2018    Lab Results  Component Value Date   NA 139 06/01/2018   K 4.2 06/01/2018   CL 114 (H) 06/01/2018   CO2 17 (L) 06/01/2018    Studies:  No results found.

## 2018-06-01 NOTE — Progress Notes (Signed)
PT Cancellation Note  Patient Details Name: Adrian [name] MRN: [id number] DOB: Oct 04, 1950   Cancelled Treatment:     PT attempted but deferred at pt request 2* fatigue.  Will follow.   Daryle Amis 06/01/2018, 1:13 PM

## 2018-06-02 LAB — CBC
HEMATOCRIT: 30.4 % — AB (ref 39.0–52.0)
Hemoglobin: 9.4 g/dL — ABNORMAL LOW (ref 13.0–17.0)
MCH: 34.9 pg — ABNORMAL HIGH (ref 26.0–34.0)
MCHC: 30.9 g/dL (ref 30.0–36.0)
MCV: 113 fL — ABNORMAL HIGH (ref 80.0–100.0)
Platelets: 209 10*3/uL (ref 150–400)
RBC: 2.69 MIL/uL — ABNORMAL LOW (ref 4.22–5.81)
WBC: 20.9 10*3/uL — ABNORMAL HIGH (ref 4.0–10.5)
nRBC: 0 % (ref 0.0–0.2)

## 2018-06-02 LAB — BASIC METABOLIC PANEL
Anion gap: 7 (ref 5–15)
BUN: 39 mg/dL — AB (ref 8–23)
CO2: 18 mmol/L — AB (ref 22–32)
Calcium: 8.3 mg/dL — ABNORMAL LOW (ref 8.9–10.3)
Chloride: 110 mmol/L (ref 98–111)
Creatinine, Ser: 0.84 mg/dL (ref 0.61–1.24)
GFR calc Af Amer: >60 mL/min (ref >60)
GFR calc non Af Amer: >60 mL/min (ref >60)
Glucose, Bld: 93 mg/dL (ref 70–99)
POTASSIUM: 4.4 mmol/L (ref 3.5–5.1)
Sodium: 135 mmol/L (ref 135–145)

## 2018-06-02 MED ORDER — ACETAMINOPHEN 325 MG PO TABS: 650.0000 mg | ORAL_TABLET | Freq: Four times a day (QID) | ORAL | Status: DC | PRN

## 2018-06-02 MED ORDER — AMOXICILLIN-POT CLAVULANATE 875-125 MG PO TABS
1.0000 | ORAL_TABLET | Freq: Two times a day (BID) | ORAL | Status: DC
  Administered 2018-06-02 – 2018-06-04 (×5): 1 via ORAL
  Filled 2018-06-02 (×5): qty 1

## 2018-06-02 MED ORDER — HYDROCODONE-ACETAMINOPHEN 5-325 MG PO TABS
1.0000 | ORAL_TABLET | Freq: Four times a day (QID) | ORAL | Status: DC | PRN
  Administered 2018-06-02 (×2): 1 via ORAL
  Filled 2018-06-02 (×2): qty 1

## 2018-06-02 NOTE — Progress Notes (Signed)
PROGRESS NOTE  Adrian [name]  FUX:[id number] DOB: 25-Aug-1950 DOA: 06/02/2018 PCP: [name], MD   Brief Narrative: Adrian [name] is a 68 y.o. male with past medical history of epiglottic cancer status post radiation, lung mass status post radiation and chemotherapy in the past (last chemotherapy August 2019), myelodysplastic syndrome on low-dose prednisone, rheumatoid arthritis, seizure disorder followed by oncology Dr. Alvy Bimler, COPD not on oxygen, congestive heart failure ejection fraction of 45%, presented to the hospital with complaints of shortness of breath for almost 3 hours prior to arrival to the ED. EMS was called in and patient was noted to be tachycardic, tachypneic with saturation in the 80s and was noted to be febrile. Patient was put on nonrebreather mask prior to arrival to the hospital.  Patient complained of fever at home but denied any runny nose,  congestion chest pain or sputum production.  He does have mild cough.  Patient states that he occasionally has some difficulty swallowing and used to have a PEG tube in the past.  Patient denies any recent travel or sick contacts.  Denies any nausea, vomiting or abdominal pain.  Denies any diarrhea or constipation.  Patient denies any urinary urgency, frequency or dysuria but had urinary retention in the ED and required a Foley catheter.  Patient used to see pulmonary in the past but has not seen him recently and he is not on any inhalers.  ED Course: In the ED, patient patient was tachycardic, tachypneic and hypertensive.  He was febrile with a maximum temperature of 101.28F.  He was thought to have sepsis and was given 30 mls per KG IV fluid bolus.   Pancultures were sent.  Lactate on presentation was 4.5.  WBC was elevated at 33,000.  Arterial blood gas showed metabolic alkalosis from hyperventilation.  Patient was noted to have infiltrate in the right middle lobe and urinalysis was abnormal.  Patient was then considered for  admission to the hospital for sepsis likely secondary to pneumonia and UTI.  Assessment & Plan: Principal Problem:   Pneumonia of right middle lobe due to infectious organism [facility]) Active Problems:   Anemia   MDS (myelodysplastic syndrome), low grade (HCC)   COPD, mild (HCC)   History of head and neck cancer   Hyperkalemia   Mild cognitive impairment   Dysphagia   Protein-calorie malnutrition, severe   Pressure injury of skin   Palliative care by specialist   DNR (do not resuscitate)  Sepsis due to PNA and Proteus UTI:  - Continue abx > convert to augmentin. - Monitor culture data (blood x2 from 1/21 remain NGTD), leukocytosis improving (44k > 21k), will continue trending though has been getting steroids.  Dysphagia, aspiration pneumonia: Has had PEG tube in past, but would not want that now.  - Per [facility] discussions, continue feedings for comfort as well as all other medical treatments.  - Continue anaerobic coverage with augmentin. Anticipate this to be a recurring and likely currently ongoing problem.  - Appreciate SLP evaluation.  Severe malignant cachexia, protein-calorie malnutrition, failure to thrive: BMI is 12. - Supplement protein as able by mouth (based on [facility] discussions). - Gets prn IVF support at home.   Acute urinary retention: Possibly due to UTI.  - Foley catheter placed 1/21, pulled 1/24.  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  - Dr. Alvy Bimler following patient. Has appointment 06/13/2018.  LUL, lingula squamous cell lung carcinoma: Undergone chemotherapy, radiation, resection: Latest PET in Aug  2019 showed increased LUL nodule and mildly increase lingular nodules (x2) suspicious for metastases as well as unchange subcarinal LN consistent with metastasis.  - Per oncology, refractory to multiple treatments. Palliative/supportive plan of care pursued with no further chemotherapy based on frailty.  Hyperkalemia.  Mild,  resolved with hydration.  - Monitor  Macrocytic anemia of chronic disease and low grade myelodysplastic syndrome:  - Suspect acute disease suppressing bone marrow activity, gave 1u PRBCs 1/23 with appropriate rise. Per oncology, pt has been getting aranesp q2 weeks and transfusion support as needed, usually every 4-6 weeks. - Continue folate, iron supplements. B12 level ok. - Follow up with oncology as outpatient.   Chronic HFrEF: EF relatively stable on repeat echo from 45-50% to 40-45%, limited by habitus and ability to cooperate with exam.  - Has some wall motion irregularities on echo. No chest pain, no ischemic ECG changes, and troponin negative, so no planned work up at this time. - Appears euvolemic/initially dehydrated. Plan to stop IVF's and monitor closely.  Seizure disorder:  - Continue keppra  Severe COPD with mild exacerbation.   - Continue on oxygen if needed and nebulizers - On chronic steroids for this, so will need prolonged taper. Switch to po 1/25.  Mild acute kidney injury likely secondary to sepsis and volume depletion.  Improving. - Will closely monitor.  Patient received adequate fluid hydration. Check BMP in AM after stopping IVF's.  Debility, generalized weakness.   - PT evaluation requested. Plan to discharge to home with ongoing home care.   Stage I sacral pressure injury: POA - Offload as able  DVT prophylaxis: Heparin subcutaneous Code Status: DNR Family Communication: Wife by phone again today.  Disposition Plan: Pending clinical trajectory. Guarded prognosis long term, but may be able to discharge from hospital in next 24-48hrs.  Consultants:   Palliative care medicine  Procedures:   Echocardiogram 05/30/2018: - Left ventricle: The cavity size was normal. Systolic function was   mildly to moderately reduced. The estimated ejection fraction was   in the range of 40% to 45%. Hypokinesis of the inferior and   inferoseptal myocardium. Doppler  parameters are consistent with   abnormal left ventricular relaxation (grade 1 diastolic   dysfunction). - Aortic valve: Transvalvular velocity was within the normal range.   There was no stenosis. There was moderate regurgitation.   Regurgitation pressure half-time: 321 ms. - Mitral valve: Transvalvular velocity was within the normal range.   There was no evidence for stenosis. There was no regurgitation. - Right ventricle: The cavity size was normal. Wall thickness was   normal. Systolic function was normal. - Atrial septum: No defect or patent foramen ovale was identified. - Tricuspid valve: There was mild-moderate regurgitation. - Pulmonary arteries: Systolic pressure was mildly increased. PA   peak pressure: 47 mm Hg (S).  Impressions: - Study limited by patient body habitus and inability to cooperate   with exam.  Antimicrobials:  Vancomycin 1/21  Cefepime, flagyl 1/21 - 1/25  Augmentin 1/25 >>   Subjective: Feels better today, transferred to floor yesterday and weaned off oxygen. Denies wheezing. No fevers. Eating better. Doesn't want to go to SNF.  Objective: BP 123/83 (BP Location: Left Arm)   Pulse (!) 101   Temp 97.7 F (36.5 C) (Oral)   Resp 15   Ht 6\' 1"  (1.854 m)   Wt 43.3 kg   SpO2 94%   BMI 12.59 kg/m   Gen: Cachectic male in no distress Pulm: Nonlabored breathing room  air. Clear, diminished. CV: Regular rate and rhythm. No murmur, rub, or gallop. No JVD, no dependent edema. GI: Abdomen soft, non-tender, non-distended, with normoactive bowel sounds.  Ext: Warm, no deformities with sarcopenia. Skin: No new rashes, lesions or ulcers on visualized skin. Neuro: Alert and oriented. No focal neurological deficits. Psych: Judgement and insight appear fair. Mood euthymic & affect congruent. Behavior is appropriate.     Data Reviewed: I have personally reviewed following labs and imaging studies  CBC: Recent Labs  Lab 05/09/2018 0500 05/26/2018 0757  05/30/18 0750 05/31/18 0437 05/31/18 0542 05/31/18 1915 06/01/18 0536 06/02/18 0535  WBC 33.0* 44.7* 38.8* 26.7*  --   --  21.1* 20.9*  NEUTROABS 31.2*  --   --   --   --   --   --   --   HGB 8.2* 8.1* 7.1* 6.9*  --  9.1* 9.1* 9.4*  HCT 26.7* 27.1* 23.2* 22.7* 19.1* 29.8* 29.3* 30.4*  MCV 130.9* 133.5* 128.2* 129.0*  --   --  114.9* 113.0*  PLT 292 257 268 246  --   --  232 161   Basic Metabolic Panel: Recent Labs  Lab 05/30/2018 0500 06/06/2018 0757 05/30/18 0750 05/31/18 0437 06/01/18 0536 06/02/18 0535  NA 141  --  141 143 139 135  K 5.3*  --  4.2 4.1 4.2 4.4  CL 110  --  116* 119* 114* 110  CO2 19*  --  17* 17* 17* 18*  GLUCOSE 98  --  116* 146* 135* 93  BUN 47*  --  37* 41* 37* 39*  CREATININE 1.47* 1.28* 0.89 0.92 0.81 0.84  CALCIUM 8.2*  --  7.5* 7.9* 8.3* 8.3*   GFR: Estimated Creatinine Clearance: 50.8 mL/min (by C-G formula based on SCr of 0.84 mg/dL). Liver Function Tests: Recent Labs  Lab 06/03/2018 0500  AST 35  ALT 23  ALKPHOS 80  BILITOT 0.7  PROT 7.7  ALBUMIN 2.8*   No results for input(s): LIPASE, AMYLASE in the last 168 hours. No results for input(s): AMMONIA in the last 168 hours. Coagulation Profile: No results for input(s): INR, PROTIME in the last 168 hours. Cardiac Enzymes: No results for input(s): CKTOTAL, CKMB, CKMBINDEX, TROPONINI in the last 168 hours. BNP (last 3 results) No results for input(s): PROBNP in the last 8760 hours. HbA1C: No results for input(s): HGBA1C in the last 72 hours. CBG: No results for input(s): GLUCAP in the last 168 hours. Lipid Profile: No results for input(s): CHOL, HDL, LDLCALC, TRIG, CHOLHDL, LDLDIRECT in the last 72 hours. Thyroid Function Tests: No results for input(s): TSH, T4TOTAL, FREET4, T3FREE, THYROIDAB in the last 72 hours. Anemia Panel: Recent Labs    05/31/18 0542  VITAMINB12 982*   Urine analysis:    Component Value Date/Time   COLORURINE YELLOW 05/23/2018 0528   APPEARANCEUR CLOUDY (A)  05/19/2018 0528   LABSPEC 1.018 06/05/2018 0528   LABSPEC 1.015 01/24/2007 0930   PHURINE 8.0 06/06/2018 0528   GLUCOSEU NEGATIVE 06/02/2018 0528   HGBUR SMALL (A) 06/01/2018 0528   BILIRUBINUR NEGATIVE 06/04/2018 0528   BILIRUBINUR Negative 01/24/2007 0930   KETONESUR NEGATIVE 05/21/2018 0528   PROTEINUR >=300 (A) 06/07/2018 0528   UROBILINOGEN 1.0 07/06/2013 0917   NITRITE NEGATIVE 06/06/2018 0528   LEUKOCYTESUR MODERATE (A) 05/24/2018 0528   LEUKOCYTESUR Negative 01/24/2007 0930   Recent Results (from the past 240 hour(s))  Blood Culture (routine x 2)     Status: None (Preliminary result)   Collection Time:  05/09/2018  5:00 AM  Result Value Ref Range Status   Specimen Description   Final    BLOOD RIGHT ANTECUBITAL Performed at [facility] [address], [place], [place] [address]    Special Requests   Final    BOTTLES DRAWN AEROBIC AND ANAEROBIC Blood Culture adequate volume Performed at [facility] [address], [place], [place] [address]    Culture   Final    NO GROWTH 4 DAYS Performed at [facility], [place] [address], [place], [place] [address]    Report Status PENDING  Incomplete  Blood Culture (routine x 2)     Status: None (Preliminary result)   Collection Time: 05/17/2018  5:10 AM  Result Value Ref Range Status   Specimen Description   Final    BLOOD BLOOD LEFT FOREARM Performed at [facility] [address], [place], [place] [address]    Special Requests   Final    BOTTLES DRAWN AEROBIC AND ANAEROBIC Blood Culture results may not be optimal due to an excessive volume of blood received in culture bottles Performed at [facility] [address], [place], [place] [address]    Culture   Final    NO GROWTH 4 DAYS Performed at [facility], [place] [address], [place], [place] [address]    Report Status PENDING  Incomplete  Urine culture     Status: Abnormal   Collection  Time: 05/28/2018  5:28 AM  Result Value Ref Range Status   Specimen Description   Final    URINE, CATHETERIZED Performed at [facility] [address] [place], [place] [address]    Special Requests   Final    NONE Performed at [facility], [place] [address] [place], [place] [address]    Culture >=100,000 COLONIES/mL PROTEUS MIRABILIS (A)  Final   Report Status 05/31/2018 FINAL  Final   Organism ID, Bacteria PROTEUS MIRABILIS (A)  Final      Susceptibility   Proteus mirabilis - MIC*    AMPICILLIN <=2 SENSITIVE Sensitive     CEFAZOLIN <=4 SENSITIVE Sensitive     CEFTRIAXONE <=1 SENSITIVE Sensitive     CIPROFLOXACIN <=0.25 SENSITIVE Sensitive     GENTAMICIN >=16 RESISTANT Resistant     IMIPENEM 1 SENSITIVE Sensitive     NITROFURANTOIN 128 RESISTANT Resistant     TRIMETH/SULFA >=320 RESISTANT Resistant     AMPICILLIN/SULBACTAM <=2 SENSITIVE Sensitive     PIP/TAZO <=4 SENSITIVE Sensitive     * >=100,000 COLONIES/mL PROTEUS MIRABILIS  Culture, blood (routine x 2) Call MD if unable to obtain prior to antibiotics being given     Status: None (Preliminary result)   Collection Time: 06/03/2018  7:57 AM  Result Value Ref Range Status   Specimen Description   Final    BLOOD RIGHT ANTECUBITAL Performed at [facility] [address], [place], [place] [address]    Special Requests   Final    BOTTLES DRAWN AEROBIC ONLY Blood Culture adequate volume Performed at [facility] [address], [place], [place] [address]    Culture   Final    NO GROWTH 4 DAYS Performed at [facility], [place] [address] [place],  [address]    Report Status PENDING  Incomplete  Culture, blood (routine x 2) Call MD if unable to obtain prior to antibiotics being given     Status: None (Preliminary  result)   Collection Time: 06/07/2018  7:59 AM  Result Value Ref Range Status   Specimen Description   Final    BLOOD LEFT  ARM Performed at [facility] [address] [place], [place] [address]    Special Requests   Final    BOTTLES DRAWN AEROBIC ONLY Blood Culture adequate volume Performed at [facility] [address], [place], [place] [address]    Culture   Final    NO GROWTH 4 DAYS Performed at [facility], [place] [address] [place], [place] [address]    Report Status PENDING  Incomplete  MRSA PCR Screening     Status: None   Collection Time: 05/12/2018  8:03 AM  Result Value Ref Range Status   MRSA by PCR NEGATIVE NEGATIVE Final    Comment:        The GeneXpert MRSA Assay (FDA approved for NASAL specimens only), is one component of a comprehensive MRSA colonization surveillance program. It is not intended to diagnose MRSA infection nor to guide or monitor treatment for MRSA infections. Performed at [facility], [place] [address], [place], [place] [address]       Radiology Studies: No results found.  Scheduled Meds: . amoxicillin-clavulanate  1 tablet Oral Q12H  . Chlorhexidine Gluconate Cloth  6 each Topical Daily  . feeding supplement (ENSURE ENLIVE)  237 mL Oral BID BM  . ferrous sulfate  325 mg Oral Q breakfast  . folic acid  1 mg Oral Daily  . heparin  5,000 Units Subcutaneous Q8H  . levETIRAcetam  500 mg Oral BID  . mouth rinse  15 mL Mouth Rinse q12n4p  . mirtazapine  7.5 mg Oral QHS  . multivitamin  15 mL Oral Daily  . predniSONE  40 mg Oral Q breakfast  . sodium chloride flush  10-40 mL Intracatheter Q12H  . vitamin E  1,000 Units Oral Daily   Continuous Infusions:    LOS: 4 days   Time spent: 25 minutes.  Patrecia Pour, MD [facility] [email or web address] Password [facility] 06/02/2018, 4:35 PM

## 2018-06-03 LAB — CULTURE, BLOOD (ROUTINE X 2)
CULTURE: NO GROWTH
Culture: NO GROWTH
Culture: NO GROWTH
Culture: NO GROWTH
SPECIAL REQUESTS: ADEQUATE
Special Requests: ADEQUATE
Special Requests: ADEQUATE

## 2018-06-03 LAB — CBC
HCT: 32.1 % — ABNORMAL LOW (ref 39.0–52.0)
Hemoglobin: 10 g/dL — ABNORMAL LOW (ref 13.0–17.0)
MCH: 35.1 pg — ABNORMAL HIGH (ref 26.0–34.0)
MCHC: 31.2 g/dL (ref 30.0–36.0)
MCV: 112.6 fL — ABNORMAL HIGH (ref 80.0–100.0)
PLATELETS: 208 10*3/uL (ref 150–400)
RBC: 2.85 MIL/uL — AB (ref 4.22–5.81)
WBC: 19.8 10*3/uL — ABNORMAL HIGH (ref 4.0–10.5)
nRBC: 0 % (ref 0.0–0.2)

## 2018-06-03 LAB — BASIC METABOLIC PANEL
Anion gap: 6 (ref 5–15)
BUN: 44 mg/dL — ABNORMAL HIGH (ref 8–23)
CO2: 18 mmol/L — ABNORMAL LOW (ref 22–32)
Calcium: 8.4 mg/dL — ABNORMAL LOW (ref 8.9–10.3)
Chloride: 112 mmol/L — ABNORMAL HIGH (ref 98–111)
Creatinine, Ser: 0.98 mg/dL (ref 0.61–1.24)
GFR calc Af Amer: >60 mL/min (ref >60)
GFR calc non Af Amer: >60 mL/min (ref >60)
Glucose, Bld: 77 mg/dL (ref 70–99)
Potassium: 4.6 mmol/L (ref 3.5–5.1)
SODIUM: 136 mmol/L (ref 135–145)

## 2018-06-03 MED ORDER — PREDNISONE 20 MG PO TABS
20.0000 mg | ORAL_TABLET | Freq: Every day | ORAL | Status: DC
  Filled 2018-06-03: qty 1

## 2018-06-03 NOTE — Progress Notes (Signed)
PROGRESS NOTE  Adrian [name]  BTD:[id number] DOB: Aug 08, 1950 DOA: 05/12/2018 PCP: [name], MD   Brief Narrative: Adrian [name] is a 68 y.o. male with past medical history of epiglottic cancer status post radiation, lung mass status post radiation and chemotherapy in the past (last chemotherapy August 2019), myelodysplastic syndrome on low-dose prednisone, rheumatoid arthritis, seizure disorder followed by oncology Dr. Alvy Bimler, COPD not on oxygen, congestive heart failure ejection fraction of 45%, presented to the hospital with complaints of shortness of breath for almost 3 hours prior to arrival to the ED. EMS was called in and patient was noted to be tachycardic, tachypneic with saturation in the 80s and was noted to be febrile. Patient was put on nonrebreather mask prior to arrival to the hospital.  Patient complained of fever at home but denied any runny nose,  congestion chest pain or sputum production.  He does have mild cough.  Patient states that he occasionally has some difficulty swallowing and used to have a PEG tube in the past.  Patient denies any recent travel or sick contacts.  Denies any nausea, vomiting or abdominal pain.  Denies any diarrhea or constipation.  Patient denies any urinary urgency, frequency or dysuria but had urinary retention in the ED and required a Foley catheter.  Patient used to see pulmonary in the past but has not seen him recently and he is not on any inhalers.  ED Course: In the ED, patient patient was tachycardic, tachypneic and hypertensive.  He was febrile with a maximum temperature of 101.38F.  He was thought to have sepsis and was given 30 mls per KG IV fluid bolus.   Pancultures were sent.  Lactate on presentation was 4.5.  WBC was elevated at 33,000.  Arterial blood gas showed metabolic alkalosis from hyperventilation.  Patient was noted to have infiltrate in the right middle lobe and urinalysis was abnormal.  Patient was then considered for  admission to the hospital for sepsis likely secondary to pneumonia and UTI.  Assessment & Plan: Principal Problem:   Pneumonia of right middle lobe due to infectious organism [facility]) Active Problems:   Anemia   MDS (myelodysplastic syndrome), low grade (HCC)   COPD, mild (HCC)   History of head and neck cancer   Hyperkalemia   Mild cognitive impairment   Dysphagia   Protein-calorie malnutrition, severe   Pressure injury of skin   Palliative care by specialist   DNR (do not resuscitate)  Sepsis due to PNA and Proteus UTI:  - Continue abx > converted to augmentin. - Monitor culture data (blood x2 from 1/21 remain NGTD), leukocytosis improving (44k > 19k), will continue trending though has been getting steroids.  Dysphagia, aspiration pneumonia: Has had PEG tube in past, but would not want that now.  - Per [place] discussions, continue feedings for comfort as well as all other medical treatments.  - Continue anaerobic coverage with augmentin. Anticipate this to be a recurring and likely currently ongoing problem.  - Appreciate SLP evaluation.  Severe malignant cachexia, protein-calorie malnutrition, failure to thrive: BMI is 12. - Supplement protein as able by mouth (based on [place] discussions). - Gets prn IVF support at home.   Acute urinary retention: Possibly due to UTI.  - Foley catheter placed 1/21, pulled 1/24 and no retention since.  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  - Dr. Alvy Bimler following patient. Has appointment 06/13/2018.  LUL, lingula squamous cell lung carcinoma: Undergone chemotherapy, radiation, resection:  Latest PET in Aug 2019 showed increased LUL nodule and mildly increase lingular nodules (x2) suspicious for metastases as well as unchange subcarinal LN consistent with metastasis.  - Per oncology, refractory to multiple treatments. Palliative/supportive plan of care pursued with no further chemotherapy based on  frailty.  Hyperkalemia.  Mild, resolved with hydration.  - Monitor  Macrocytic anemia of chronic disease and low grade myelodysplastic syndrome:  - Suspect acute disease suppressing bone marrow activity, gave 1u PRBCs 1/23 with appropriate rise. Per oncology, pt has been getting aranesp q2 weeks and transfusion support as needed, usually every 4-6 weeks. - Continue folate, iron supplements. B12 level ok. - Follow up with oncology as outpatient.   Chronic HFrEF: EF relatively stable on repeat echo from 45-50% to 40-45%, limited by habitus and ability to cooperate with exam.  - Has some wall motion irregularities on echo. No chest pain, no ischemic ECG changes, and troponin negative, so no planned work up at this time. - Appears euvolemic/initially dehydrated. Plan to stop IVF's and monitor closely.  Seizure disorder:  - Continue keppra  Severe COPD with mild exacerbation.   - Continue on oxygen if needed and nebulizers - On chronic steroids for this, so will need prolonged taper. Switched to po 1/25, taper to 20mg  (10mg  home dose) 1/27 if stable.  Mild acute kidney injury likely secondary to sepsis and volume depletion.  Improving. - Will closely monitor.  Patient received adequate fluid hydration. Creatinine and BUN rising after stopping IV fluids. Urged to push po, especially ensure. If able to tolerate this, may be discharged soon. Recheck BMP in AM.  Debility, generalized weakness.   - PT evaluations, Plan to discharge to home with ongoing home care.   Stage I sacral pressure injury: POA - Offload as able  DVT prophylaxis: Heparin subcutaneous Code Status: DNR Family Communication: Wife at bedside. Disposition Plan: Most appropriate for 24hr care though declining SNF. Remains debilitated from baseline and still with significant leukocytosis on antibiotics. Creatinine rising after stopping IV fluids. Will monitor for another 24 hours and if stabilizing, will DC  home.  Consultants:   Palliative care medicine  Oncology, Dr. Alvy Bimler  Procedures:   Echocardiogram 05/30/2018: - Left ventricle: The cavity size was normal. Systolic function was   mildly to moderately reduced. The estimated ejection fraction was   in the range of 40% to 45%. Hypokinesis of the inferior and   inferoseptal myocardium. Doppler parameters are consistent with   abnormal left ventricular relaxation (grade 1 diastolic   dysfunction). - Aortic valve: Transvalvular velocity was within the normal range.   There was no stenosis. There was moderate regurgitation.   Regurgitation pressure half-time: 321 ms. - Mitral valve: Transvalvular velocity was within the normal range.   There was no evidence for stenosis. There was no regurgitation. - Right ventricle: The cavity size was normal. Wall thickness was   normal. Systolic function was normal. - Atrial septum: No defect or patent foramen ovale was identified. - Tricuspid valve: There was mild-moderate regurgitation. - Pulmonary arteries: Systolic pressure was mildly increased. PA   peak pressure: 47 mm Hg (S).  Impressions: - Study limited by patient body habitus and inability to cooperate   with exam.  Antimicrobials:  Vancomycin 1/21  Cefepime, flagyl 1/21 - 1/25  Augmentin 1/25 >>   Subjective: He tells me he's eating well though his wife states he's not eaten anything. No dyspnea, mild cough remains. Refusing SNF placement.  Objective: BP 119/86 (BP Location: Left  Arm)   Pulse 99   Temp 97.8 F (36.6 C) (Oral)   Resp 16   Ht 6\' 1"  (1.854 m)   Wt 43.3 kg   SpO2 94%   BMI 12.59 kg/m   Gen: Cachectic male resting quietly. Pulm: Nonlabored breathing room air. Clear. CV: Regular rate and rhythm. No murmur, rub, or gallop. No JVD, no dependent edema. GI: Abdomen soft, non-tender, non-distended, with normoactive bowel sounds.  Ext: Warm, no deformities. Emaciated. Skin: No rashes, lesions or ulcers on  visualized skin. Neuro: Alert and oriented. No focal neurological deficits. Psych: Judgement and insight appear fair. Mood euthymic & affect congruent. Behavior is appropriate.    Data Reviewed: I have personally reviewed following labs and imaging studies  CBC: Recent Labs  Lab 05/31/2018 0500  05/30/18 0750 05/31/18 0437 05/31/18 0542 05/31/18 1915 06/01/18 0536 06/02/18 0535 06/03/18 0500  WBC 33.0*   < > 38.8* 26.7*  --   --  21.1* 20.9* 19.8*  NEUTROABS 31.2*  --   --   --   --   --   --   --   --   HGB 8.2*   < > 7.1* 6.9*  --  9.1* 9.1* 9.4* 10.0*  HCT 26.7*   < > 23.2* 22.7* 19.1* 29.8* 29.3* 30.4* 32.1*  MCV 130.9*   < > 128.2* 129.0*  --   --  114.9* 113.0* 112.6*  PLT 292   < > 268 246  --   --  232 209 208   < > = values in this interval not displayed.   Basic Metabolic Panel: Recent Labs  Lab 05/30/18 0750 05/31/18 0437 06/01/18 0536 06/02/18 0535 06/03/18 0500  NA 141 143 139 135 136  K 4.2 4.1 4.2 4.4 4.6  CL 116* 119* 114* 110 112*  CO2 17* 17* 17* 18* 18*  GLUCOSE 116* 146* 135* 93 77  BUN 37* 41* 37* 39* 44*  CREATININE 0.89 0.92 0.81 0.84 0.98  CALCIUM 7.5* 7.9* 8.3* 8.3* 8.4*   GFR: Estimated Creatinine Clearance: 43.6 mL/min (by C-G formula based on SCr of 0.98 mg/dL). Liver Function Tests: Recent Labs  Lab 05/28/2018 0500  AST 35  ALT 23  ALKPHOS 80  BILITOT 0.7  PROT 7.7  ALBUMIN 2.8*   Urine analysis:    Component Value Date/Time   COLORURINE YELLOW 05/19/2018 0528   APPEARANCEUR CLOUDY (A) 06/01/2018 0528   LABSPEC 1.018 05/12/2018 0528   LABSPEC 1.015 01/24/2007 0930   PHURINE 8.0 05/22/2018 0528   GLUCOSEU NEGATIVE 06/02/2018 0528   HGBUR SMALL (A) 06/04/2018 0528   BILIRUBINUR NEGATIVE 06/04/2018 0528   BILIRUBINUR Negative 01/24/2007 0930   KETONESUR NEGATIVE 06/03/2018 0528   PROTEINUR >=300 (A) 05/18/2018 0528   UROBILINOGEN 1.0 07/06/2013 0917   NITRITE NEGATIVE 06/02/2018 0528   LEUKOCYTESUR MODERATE (A) 05/18/2018  0528   LEUKOCYTESUR Negative 01/24/2007 0930   Recent Results (from the past 240 hour(s))  Blood Culture (routine x 2)     Status: None   Collection Time: 05/27/2018  5:00 AM  Result Value Ref Range Status   Specimen Description   Final    BLOOD RIGHT ANTECUBITAL Performed at [facility], [place] [address] [place], [place] [address]    Special Requests   Final    BOTTLES DRAWN AEROBIC AND ANAEROBIC Blood Culture adequate volume Performed at [place] [address] [place], [place] [address]    Culture   Final  NO GROWTH 5 DAYS Performed at [facility], [place] [address], [place], [place] [address]    Report Status 06/03/2018 FINAL  Final  Blood Culture (routine x 2)     Status: None   Collection Time: 05/17/2018  5:10 AM  Result Value Ref Range Status   Specimen Description   Final    BLOOD BLOOD LEFT FOREARM Performed at [facility] [address], [place], [place] [address]    Special Requests   Final    BOTTLES DRAWN AEROBIC AND ANAEROBIC Blood Culture results may not be optimal due to an excessive volume of blood received in culture bottles Performed at [place] [address], [place], [place] [address]    Culture   Final    NO GROWTH 5 DAYS Performed at [facility], [place] [address] [place], [place] [address]    Report Status 06/03/2018 FINAL  Final  Urine culture     Status: Abnormal   Collection Time: 06/07/2018  5:28 AM  Result Value Ref Range Status   Specimen Description   Final    URINE, CATHETERIZED Performed at [place] [address] [place], [place] [address]    Special Requests   Final    NONE Performed at [facility], [place] [address], [place], [place] [address]    Culture >=100,000 COLONIES/mL PROTEUS MIRABILIS (A)  Final   Report Status 05/31/2018 FINAL  Final   Organism ID, Bacteria PROTEUS MIRABILIS (A)  Final       Susceptibility   Proteus mirabilis - MIC*    AMPICILLIN <=2 SENSITIVE Sensitive     CEFAZOLIN <=4 SENSITIVE Sensitive     CEFTRIAXONE <=1 SENSITIVE Sensitive     CIPROFLOXACIN <=0.25 SENSITIVE Sensitive     GENTAMICIN >=16 RESISTANT Resistant     IMIPENEM 1 SENSITIVE Sensitive     NITROFURANTOIN 128 RESISTANT Resistant     TRIMETH/SULFA >=320 RESISTANT Resistant     AMPICILLIN/SULBACTAM <=2 SENSITIVE Sensitive     PIP/TAZO <=4 SENSITIVE Sensitive     * >=100,000 COLONIES/mL PROTEUS MIRABILIS  Culture, blood (routine x 2) Call MD if unable to obtain prior to antibiotics being given     Status: None   Collection Time: 05/20/2018  7:57 AM  Result Value Ref Range Status   Specimen Description   Final    BLOOD RIGHT ANTECUBITAL Performed at [place] [address], [place], [place] [address]    Special Requests   Final    BOTTLES DRAWN AEROBIC ONLY Blood Culture adequate volume Performed at [place] [address], [place], [place] [address]    Culture   Final    NO GROWTH 5 DAYS Performed at [facility], [place] [address] [place], [place] [address]    Report Status 06/03/2018 FINAL  Final  Culture, blood (routine x 2) Call MD if unable to obtain prior to antibiotics being given     Status: None   Collection Time: 05/15/2018  7:59 AM  Result Value Ref Range Status   Specimen Description   Final    BLOOD LEFT ARM Performed at [place] [address], [place], [place] [address]    Special Requests   Final    BOTTLES DRAWN AEROBIC ONLY Blood Culture adequate volume Performed at [facility] [address] [place], [place] [address]    Culture   Final  NO GROWTH 5 DAYS Performed at [facility], [place] [address] [place], [place] [address]    Report Status 06/03/2018 FINAL  Final  MRSA PCR Screening     Status: None   Collection Time: 05/20/2018  8:03 AM  Result Value Ref  Range Status   MRSA by PCR NEGATIVE NEGATIVE Final    Comment:        The GeneXpert MRSA Assay (FDA approved for NASAL specimens only), is one component of a comprehensive MRSA colonization surveillance program. It is not intended to diagnose MRSA infection nor to guide or monitor treatment for MRSA infections. Performed at [facility], [place] [address], [place], [place] [address]       Radiology Studies: No results found.  Scheduled Meds: . amoxicillin-clavulanate  1 tablet Oral Q12H  . Chlorhexidine Gluconate Cloth  6 each Topical Daily  . feeding supplement (ENSURE ENLIVE)  237 mL Oral BID BM  . ferrous sulfate  325 mg Oral Q breakfast  . folic acid  1 mg Oral Daily  . heparin  5,000 Units Subcutaneous Q8H  . levETIRAcetam  500 mg Oral BID  . mouth rinse  15 mL Mouth Rinse q12n4p  . mirtazapine  7.5 mg Oral QHS  . multivitamin  15 mL Oral Daily  . predniSONE  40 mg Oral Q breakfast  . sodium chloride flush  10-40 mL Intracatheter Q12H  . vitamin E  1,000 Units Oral Daily   Continuous Infusions:    LOS: 5 days   Time spent: 25 minutes.  Patrecia Pour, MD [facility] [email or web address] Password [facility] 06/03/2018, 3:31 PM

## 2018-06-04 DIAGNOSIS — R627 Adult failure to thrive: Secondary | ICD-10-CM

## 2018-06-04 LAB — CBC
HCT: 31.8 % — ABNORMAL LOW (ref 39.0–52.0)
Hemoglobin: 9.9 g/dL — ABNORMAL LOW (ref 13.0–17.0)
MCH: 35.9 pg — ABNORMAL HIGH (ref 26.0–34.0)
MCHC: 31.1 g/dL (ref 30.0–36.0)
MCV: 115.2 fL — ABNORMAL HIGH (ref 80.0–100.0)
Platelets: 210 10*3/uL (ref 150–400)
RBC: 2.76 MIL/uL — ABNORMAL LOW (ref 4.22–5.81)
WBC: 26 10*3/uL — ABNORMAL HIGH (ref 4.0–10.5)
nRBC: 0 % (ref 0.0–0.2)

## 2018-06-04 LAB — BASIC METABOLIC PANEL
ANION GAP: 7 (ref 5–15)
BUN: 54 mg/dL — ABNORMAL HIGH (ref 8–23)
CO2: 17 mmol/L — ABNORMAL LOW (ref 22–32)
Calcium: 8.7 mg/dL — ABNORMAL LOW (ref 8.9–10.3)
Chloride: 114 mmol/L — ABNORMAL HIGH (ref 98–111)
Creatinine, Ser: 1.28 mg/dL — ABNORMAL HIGH (ref 0.61–1.24)
GFR calc non Af Amer: 57 mL/min — ABNORMAL LOW (ref >60)
Glucose, Bld: 122 mg/dL — ABNORMAL HIGH (ref 70–99)
Potassium: 5 mmol/L (ref 3.5–5.1)
Sodium: 138 mmol/L (ref 135–145)

## 2018-06-04 MED ORDER — METHYLPREDNISOLONE SODIUM SUCC 40 MG IJ SOLR
40.0000 mg | Freq: Three times a day (TID) | INTRAMUSCULAR | Status: DC
  Administered 2018-06-04 – 2018-06-05 (×4): 40 mg via INTRAVENOUS
  Filled 2018-06-04 (×4): qty 1

## 2018-06-04 MED ORDER — SODIUM CHLORIDE 0.9 % IV SOLN
INTRAVENOUS | Status: DC
  Administered 2018-06-04: 20:00:00 via INTRAVENOUS

## 2018-06-04 NOTE — Care Management Important Message (Signed)
Important Message  Patient Details  Name: Adrian [name] MRN: [id number] Date of Birth: 11-28-1950   Medicare Important Message Given:  Yes    Kerin Salen 06/04/2018, 9:49 AMImportant Message  Patient Details  Name: Adrian [name] MRN: [id number] Date of Birth: 1951/03/25   Medicare Important Message Given:  Yes    Kerin Salen 06/04/2018, 9:49 AM

## 2018-06-04 NOTE — Progress Notes (Addendum)
PROGRESS NOTE  ADE STMARIE  PJK:[id number] DOB: [birth year] DOA: 05/27/2018 PCP: [name], MD   Brief Narrative: Adrian [name] is a 68 y.o. male with past medical history of epiglottic cancer status post radiation, lung mass status post radiation and chemotherapy in the past (last chemotherapy August 2019), myelodysplastic syndrome on low-dose prednisone, rheumatoid arthritis, seizure disorder followed by oncology Dr. Alvy Bimler, COPD not on oxygen, congestive heart failure ejection fraction of 45%, presented to the hospital with complaints of shortness of breath for almost 3 hours prior to arrival to the ED. EMS was called in and patient was noted to be tachycardic, tachypneic with saturation in the 80s and was noted to be febrile. Patient was put on nonrebreather mask prior to arrival to the hospital.  Patient complained of fever at home but denied any runny nose,  congestion chest pain or sputum production.  He does have mild cough.  Patient states that he occasionally has some difficulty swallowing and used to have a PEG tube in the past.  Patient denies any recent travel or sick contacts.  Denies any nausea, vomiting or abdominal pain.  Denies any diarrhea or constipation.  Patient denies any urinary urgency, frequency or dysuria but had urinary retention in the ED and required a Foley catheter.  Patient used to see pulmonary in the past but has not seen him recently and he is not on any inhalers.  ED Course: In the ED, patient patient was tachycardic, tachypneic and hypertensive.  He was febrile with a maximum temperature of 101.41F.  He was thought to have sepsis and was given 30 mls per KG IV fluid bolus.   Pancultures were sent.  Lactate on presentation was 4.5.  WBC was elevated at 33,000.  Arterial blood gas showed metabolic alkalosis from hyperventilation.  Patient was noted to have infiltrate in the right middle lobe and urinalysis was abnormal.  Patient was then considered for  admission to the hospital for sepsis likely secondary to pneumonia and UTI.  Assessment & Plan: Principal Problem:   Pneumonia of right middle lobe due to infectious organism [facility]) Active Problems:   Anemia   MDS (myelodysplastic syndrome), low grade (HCC)   COPD, mild (HCC)   History of head and neck cancer   Hyperkalemia   Mild cognitive impairment   Dysphagia   Protein-calorie malnutrition, severe   Pressure injury of skin   Palliative care by specialist   DNR (do not resuscitate)  Sepsis due to PNA and Proteus UTI:  - Continue abx > converted to augmentin. - Monitor culture data (blood x2 from 1/21 remain NGTD), leukocytosis improved  Dysphagia, aspiration pneumonia: Has had PEG tube in past, but would not want that now.  - Per [place] discussions, continue feedings for comfort as well as all other medical treatments.  - Continue anaerobic coverage with augmentin. Anticipate this to be a recurring and likely currently ongoing problem. WBC up today and possibly more congested. Strongly suspect recurrent aspiration event in the past 24 hours. Will support with augmentation of IVF and steroids, though this is illustrative of suspected course for this gentleman. - Appreciate SLP evaluation.  Severe malignant cachexia, protein-calorie malnutrition, failure to thrive: BMI is 12. - Supplement protein as able by mouth (based on [place] discussions). - Gets prn IVF support at home.   Acute urinary retention: Possibly due to UTI.  - Foley catheter placed 1/21, pulled 1/24 and no retention since.  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  - Dr. Alvy Bimler following patient. Has appointment 06/13/2018.  LUL, lingula squamous cell lung carcinoma: Undergone chemotherapy, radiation, resection: Latest PET in Aug 2019 showed increased LUL nodule and mildly increase lingular nodules (x2) suspicious for metastases as well as unchange subcarinal LN consistent with  metastasis.  - Per oncology, refractory to multiple treatments. Palliative/supportive plan of care pursued with no further chemotherapy based on frailty.  Hyperkalemia.  Mild, resolved with hydration.  - Monitor  Macrocytic anemia of chronic disease and low grade myelodysplastic syndrome:  - Suspect acute disease suppressing bone marrow activity, gave 1u PRBCs 1/23 with appropriate rise. Per oncology, pt has been getting aranesp q2 weeks and transfusion support as needed, usually every 4-6 weeks. - Continue folate, iron supplements. B12 level ok. - Follow up with oncology as outpatient.   Chronic HFrEF: EF relatively stable on repeat echo from 45-50% to 40-45%, limited by habitus and ability to cooperate with exam.  - Has some wall motion irregularities on echo. No chest pain, no ischemic ECG changes, and troponin negative, so no planned work up at this time. - Appears euvolemic/initially dehydrated. Plan to stop IVF's and monitor closely.  Seizure disorder:  - Continue keppra  Severe COPD with mild exacerbation.   - Continue on oxygen if needed and nebulizers - On chronic steroids for this, so will need prolonged taper. Switched to po 1/25, tapered. Will give additional IV dose today.  Acute kidney injury likely secondary to sepsis and volume depletion.  Improving with IVF, but promptly worsened when IVF withdrawn.  - Restart IVF for today. Based on [place] discussions.  Debility, generalized weakness.   - PT evaluations, Plan to discharge to home with ongoing home care.   Stage I sacral pressure injury: POA - Offload as able  DVT prophylaxis: Heparin subcutaneous Code Status: DNR Family Communication: Wife not at bedside this morning. Planning palliative care meeting 1/28. Disposition Plan: More and more thinking he will be most appropriate for home with hospice vs. residential hospice.  Consultants:   Palliative care medicine  Oncology, Dr. Alvy Bimler  Procedures:    Echocardiogram 05/30/2018: - Left ventricle: The cavity size was normal. Systolic function was   mildly to moderately reduced. The estimated ejection fraction was   in the range of 40% to 45%. Hypokinesis of the inferior and   inferoseptal myocardium. Doppler parameters are consistent with   abnormal left ventricular relaxation (grade 1 diastolic   dysfunction). - Aortic valve: Transvalvular velocity was within the normal range.   There was no stenosis. There was moderate regurgitation.   Regurgitation pressure half-time: 321 ms. - Mitral valve: Transvalvular velocity was within the normal range.   There was no evidence for stenosis. There was no regurgitation. - Right ventricle: The cavity size was normal. Wall thickness was   normal. Systolic function was normal. - Atrial septum: No defect or patent foramen ovale was identified. - Tricuspid valve: There was mild-moderate regurgitation. - Pulmonary arteries: Systolic pressure was mildly increased. PA   peak pressure: 47 mm Hg (S).  Impressions: - Study limited by patient body habitus and inability to cooperate   with exam.  Antimicrobials:  Vancomycin 1/21  Cefepime, flagyl 1/21 - 1/25  Augmentin 1/25 >>   Subjective: Visited early this morning. Palliative care NP later enters room. He tells [place] that he just wants to go home. He denies trouble breathing or chest pain. Not eating.   Objective: BP 112/78 (BP Location: Right Arm)   Pulse  80 Comment: rn was notified  Temp 98.1 F (36.7 C) (Oral)   Resp 20   Ht 6\' 1"  (1.854 m)   Wt 42.2 kg   SpO2 96% Comment: rn was notified  BMI 12.27 kg/m   Gen: 68 y.o. male in no distress Pulm: Nonlabored on room air, no crackles.  CV: Regular rate and rhythm. No murmur, rub, or gallop. No JVD, no dependent edema. GI: Abdomen soft, non-tender, non-distended, with normoactive bowel sounds.  Ext: Warm, no deformities. Emaciated. Skin: No new rashes, lesions or ulcers on visualized  skin. Neuro: Alert and oriented. No focal neurological deficits. Psych: Judgement and insight appear intact. Mood euthymic & affect congruent. Behavior is appropriate.    Data Reviewed: I have personally reviewed following labs and imaging studies  CBC: Recent Labs  Lab 05/13/2018 0500  05/31/18 0437  05/31/18 1915 06/01/18 0536 06/02/18 0535 06/03/18 0500 06/04/18 0618  WBC 33.0*   < > 26.7*  --   --  21.1* 20.9* 19.8* 26.0*  NEUTROABS 31.2*  --   --   --   --   --   --   --   --   HGB 8.2*   < > 6.9*  --  9.1* 9.1* 9.4* 10.0* 9.9*  HCT 26.7*   < > 22.7*   < > 29.8* 29.3* 30.4* 32.1* 31.8*  MCV 130.9*   < > 129.0*  --   --  114.9* 113.0* 112.6* 115.2*  PLT 292   < > 246  --   --  232 209 208 210   < > = values in this interval not displayed.   Basic Metabolic Panel: Recent Labs  Lab 05/31/18 0437 06/01/18 0536 06/02/18 0535 06/03/18 0500 06/04/18 0618  NA 143 139 135 136 138  K 4.1 4.2 4.4 4.6 5.0  CL 119* 114* 110 112* 114*  CO2 17* 17* 18* 18* 17*  GLUCOSE 146* 135* 93 77 122*  BUN 41* 37* 39* 44* 54*  CREATININE 0.92 0.81 0.84 0.98 1.28*  CALCIUM 7.9* 8.3* 8.3* 8.4* 8.7*   GFR: Estimated Creatinine Clearance: 32.5 mL/min (A) (by C-G formula based on SCr of 1.28 mg/dL (H)). Liver Function Tests: Recent Labs  Lab 05/23/2018 0500  AST 35  ALT 23  ALKPHOS 80  BILITOT 0.7  PROT 7.7  ALBUMIN 2.8*   Urine analysis:    Component Value Date/Time   COLORURINE YELLOW 05/28/2018 0528   APPEARANCEUR CLOUDY (A) 05/26/2018 0528   LABSPEC 1.018 05/13/2018 0528   LABSPEC 1.015 01/24/2007 0930   PHURINE 8.0 06/05/2018 0528   GLUCOSEU NEGATIVE 05/28/2018 0528   HGBUR SMALL (A) 05/17/2018 0528   BILIRUBINUR NEGATIVE 05/22/2018 0528   BILIRUBINUR Negative 01/24/2007 0930   KETONESUR NEGATIVE 06/08/2018 0528   PROTEINUR >=300 (A) 05/28/2018 0528   UROBILINOGEN 1.0 07/06/2013 0917   NITRITE NEGATIVE 06/08/2018 0528   LEUKOCYTESUR MODERATE (A) 05/17/2018 0528    LEUKOCYTESUR Negative 01/24/2007 0930   Recent Results (from the past 240 hour(s))  Blood Culture (routine x 2)     Status: None   Collection Time: 05/24/2018  5:00 AM  Result Value Ref Range Status   Specimen Description   Final    BLOOD RIGHT ANTECUBITAL Performed at [facility], [place] [address], [place], [place] [address]    Special Requests   Final    BOTTLES DRAWN AEROBIC AND ANAEROBIC Blood Culture adequate volume Performed at [place] [address] [place], [place]  [address]    Culture   Final    NO GROWTH 5 DAYS Performed at [facility], [place] [address], [place], [place] [address]    Report Status 06/03/2018 FINAL  Final  Blood Culture (routine x 2)     Status: None   Collection Time: 05/12/2018  5:10 AM  Result Value Ref Range Status   Specimen Description   Final    BLOOD BLOOD LEFT FOREARM Performed at [place] [place] [address] [place], [place] [address]    Special Requests   Final    BOTTLES DRAWN AEROBIC AND ANAEROBIC Blood Culture results may not be optimal due to an excessive volume of blood received in culture bottles Performed at [place] [address], [place], [place] [address]    Culture   Final    NO GROWTH 5 DAYS Performed at [facility], [place] [address], [place], [place] [address]    Report Status 06/03/2018 FINAL  Final  Urine culture     Status: Abnormal   Collection Time: 05/20/2018  5:28 AM  Result Value Ref Range Status   Specimen Description   Final    URINE, CATHETERIZED Performed at [place] [facility] [address] [place], [place] [address]    Special Requests   Final    NONE Performed at [facility], [place] [address], [place], [place] [address]    Culture >=100,000 COLONIES/mL PROTEUS MIRABILIS (A)  Final   Report Status 05/31/2018 FINAL  Final   Organism ID, Bacteria PROTEUS MIRABILIS (A)  Final       Susceptibility   Proteus mirabilis - MIC*    AMPICILLIN <=2 SENSITIVE Sensitive     CEFAZOLIN <=4 SENSITIVE Sensitive     CEFTRIAXONE <=1 SENSITIVE Sensitive     CIPROFLOXACIN <=0.25 SENSITIVE Sensitive     GENTAMICIN >=16 RESISTANT Resistant     IMIPENEM 1 SENSITIVE Sensitive     NITROFURANTOIN 128 RESISTANT Resistant     TRIMETH/SULFA >=320 RESISTANT Resistant     AMPICILLIN/SULBACTAM <=2 SENSITIVE Sensitive     PIP/TAZO <=4 SENSITIVE Sensitive     * >=100,000 COLONIES/mL PROTEUS MIRABILIS  Culture, blood (routine x 2) Call MD if unable to obtain prior to antibiotics being given     Status: None   Collection Time: 05/28/2018  7:57 AM  Result Value Ref Range Status   Specimen Description   Final    BLOOD RIGHT ANTECUBITAL Performed at [place] [address], [place], [place] [address]    Special Requests   Final    BOTTLES DRAWN AEROBIC ONLY Blood Culture adequate volume Performed at [facility] [address] [place], [place] [address]    Culture   Final    NO GROWTH 5 DAYS Performed at [facility], [place] [address] [place], [place] [address]    Report Status 06/03/2018 FINAL  Final  Culture, blood (routine x 2) Call MD if unable to obtain prior to antibiotics being given     Status: None   Collection Time: 06/08/2018  7:59 AM  Result Value Ref Range Status   Specimen Description   Final    BLOOD LEFT ARM Performed at [facility] [place] [address], [place],  [address]    Special Requests   Final    BOTTLES DRAWN AEROBIC ONLY Blood Culture adequate volume Performed at [place] [address] [place],  [place] [address]    Culture   Final    NO GROWTH 5 DAYS Performed at [facility], [place] [address], [place], [place] [address]    Report Status 06/03/2018 FINAL  Final  MRSA PCR Screening     Status: None   Collection Time: 06/02/2018  8:03 AM  Result Value Ref Range  Status   MRSA by PCR NEGATIVE NEGATIVE Final    Comment:        The GeneXpert MRSA Assay (FDA approved for NASAL specimens only), is one component of a comprehensive MRSA colonization surveillance program. It is not intended to diagnose MRSA infection nor to guide or monitor treatment for MRSA infections. Performed at [facility], [place] [address], [place], [place] [address]       Radiology Studies: No results found.  Scheduled Meds: . amoxicillin-clavulanate  1 tablet Oral Q12H  . Chlorhexidine Gluconate Cloth  6 each Topical Daily  . feeding supplement (ENSURE ENLIVE)  237 mL Oral BID BM  . ferrous sulfate  325 mg Oral Q breakfast  . folic acid  1 mg Oral Daily  . heparin  5,000 Units Subcutaneous Q8H  . levETIRAcetam  500 mg Oral BID  . mouth rinse  15 mL Mouth Rinse q12n4p  . methylPREDNISolone (SOLU-MEDROL) injection  40 mg Intravenous Q8H  . mirtazapine  7.5 mg Oral QHS  . multivitamin  15 mL Oral Daily  . sodium chloride flush  10-40 mL Intracatheter Q12H  . vitamin E  1,000 Units Oral Daily   Continuous Infusions: . sodium chloride       LOS: 6 days   Time spent: 35 minutes.  Patrecia Pour, MD [facility] [email or web address] Password [facility] 06/04/2018, 4:49 PM

## 2018-06-04 NOTE — Progress Notes (Signed)
Patient ID: Adrian [name], male   DOB: 01-Feb-1951, 68 y.o.   MRN: [id number]  This NP visited patient at the bedside as a follow up to  yesterday's [place] for palliative medicine needs and emotional support.  Patient is weak but awake and able to engage in limited conversation.  He denies any pain or discomfort at this time. Dr Bonner Puna and myself at bedside.  Discussed with patient the seriousness of his medical situation, limited medical interventions to prolong life and poor prognosis.  [name] communicates understanding and desire to go home for his final days. Discussed with patient the seriousness of his medical situation, limited medical interventions to prolong life and poor prognosis.  I spoke with his wife by phone.    Again dscussed  the seriousness of his medical situation, limited medical interventions to prolong life and poor prognosis.  Discussed decisions that patient and her family are facing regardring treatment options decisions, advanced directive decisions and anticipatory care needs.  At this time Mrs Hallenbeck cannot make any "big decisions" and agrees to meet with me tomorrow afternoon at 4:30.  Patient is high risk for decompensation. Wife verbalized yesterday that she is not interested in any  skilled nursing facility.   Questions and concerns addressed   Discussed with Dr Bonner Puna  Total time spent on the unit was 45 minutes  Greater than 50% of the time was spent in counseling and coordination of care  Wadie Lessen NP  Palliative Medicine Team Team Phone # [phone] Pager [phone]

## 2018-06-05 DIAGNOSIS — R627 Adult failure to thrive: Secondary | ICD-10-CM

## 2018-06-05 LAB — CBC
HCT: 30.9 % — ABNORMAL LOW (ref 39.0–52.0)
Hemoglobin: 9.6 g/dL — ABNORMAL LOW (ref 13.0–17.0)
MCH: 36.1 pg — AB (ref 26.0–34.0)
MCHC: 31.1 g/dL (ref 30.0–36.0)
MCV: 116.2 fL — AB (ref 80.0–100.0)
PLATELETS: 201 10*3/uL (ref 150–400)
RBC: 2.66 MIL/uL — ABNORMAL LOW (ref 4.22–5.81)
WBC: 26.8 10*3/uL — ABNORMAL HIGH (ref 4.0–10.5)
nRBC: 0 % (ref 0.0–0.2)

## 2018-06-05 LAB — BASIC METABOLIC PANEL
Anion gap: 11 (ref 5–15)
BUN: 70 mg/dL — AB (ref 8–23)
CO2: 14 mmol/L — ABNORMAL LOW (ref 22–32)
Calcium: 8.5 mg/dL — ABNORMAL LOW (ref 8.9–10.3)
Chloride: 114 mmol/L — ABNORMAL HIGH (ref 98–111)
Creatinine, Ser: 1.57 mg/dL — ABNORMAL HIGH (ref 0.61–1.24)
GFR calc Af Amer: 51 mL/min — ABNORMAL LOW (ref >60)
GFR calc non Af Amer: 44 mL/min — ABNORMAL LOW (ref >60)
Glucose, Bld: 85 mg/dL (ref 70–99)
Potassium: 5 mmol/L (ref 3.5–5.1)
Sodium: 139 mmol/L (ref 135–145)

## 2018-06-05 MED ORDER — METHYLPREDNISOLONE SODIUM SUCC 40 MG IJ SOLR
40.0000 mg | Freq: Two times a day (BID) | INTRAMUSCULAR | Status: DC
  Administered 2018-06-06 – 2018-06-09 (×7): 40 mg via INTRAVENOUS
  Filled 2018-06-05 (×7): qty 1

## 2018-06-05 MED ORDER — AMOXICILLIN-POT CLAVULANATE 500-125 MG PO TABS
1.0000 | ORAL_TABLET | Freq: Two times a day (BID) | ORAL | Status: DC
  Administered 2018-06-05 – 2018-06-08 (×3): 500 mg via ORAL
  Filled 2018-06-05 (×9): qty 1

## 2018-06-05 MED ORDER — SODIUM CHLORIDE 0.9 % IV SOLN
INTRAVENOUS | Status: DC
  Administered 2018-06-06 – 2018-06-08 (×5): via INTRAVENOUS

## 2018-06-05 NOTE — Progress Notes (Signed)
PROGRESS NOTE  Adrian [name]  UKG:[id number] DOB: 06-Jul-1950 DOA: 05/14/2018 PCP: [name], MD   Brief Narrative: Adrian [name] is a 68 y.o. male with past medical history of epiglottic cancer status post radiation, lung mass status post radiation and chemotherapy in the past (last chemotherapy August 2019), myelodysplastic syndrome on low-dose prednisone, rheumatoid arthritis, seizure disorder followed by oncology Dr. Alvy Bimler, COPD not on oxygen, congestive heart failure ejection fraction of 45%, presented to the hospital with complaints of shortness of breath for almost 3 hours prior to arrival to the ED. EMS was called in and patient was noted to be tachycardic, tachypneic with saturation in the 80s and was noted to be febrile. Patient was put on nonrebreather mask prior to arrival to the hospital.  Patient complained of fever at home but denied any runny nose,  congestion chest pain or sputum production.  He does have mild cough.  Patient states that he occasionally has some difficulty swallowing and used to have a PEG tube in the past.  Patient denies any recent travel or sick contacts.  Denies any nausea, vomiting or abdominal pain.  Denies any diarrhea or constipation.  Patient denies any urinary urgency, frequency or dysuria but had urinary retention in the ED and required a Foley catheter.  Patient used to see pulmonary in the past but has not seen him recently and he is not on any inhalers.  ED Course: In the ED, patient patient was tachycardic, tachypneic and hypertensive.  He was febrile with a maximum temperature of 101.18F.  He was thought to have sepsis and was given 30 mls per KG IV fluid bolus.   Pancultures were sent.  Lactate on presentation was 4.5.  WBC was elevated at 33,000.  Arterial blood gas showed metabolic alkalosis from hyperventilation.  Patient was noted to have infiltrate in the right middle lobe and urinalysis was abnormal.  Patient was then considered for  admission to the hospital for sepsis likely secondary to pneumonia and UTI.  Assessment & Plan: Principal Problem:   Pneumonia of right middle lobe due to infectious organism [facility]) Active Problems:   Anemia   MDS (myelodysplastic syndrome), low grade (HCC)   COPD, mild (HCC)   History of head and neck cancer   Hyperkalemia   Mild cognitive impairment   Dysphagia   Protein-calorie malnutrition, severe   Pressure injury of skin   Palliative care by specialist   DNR (do not resuscitate)   Adult failure to thrive  Sepsis due to PNA and Proteus UTI:  - Continue abx > converted to augmentin. - Monitor culture data (blood x2 from 1/21 remain NGTD)   Dysphagia, aspiration pneumonia: Has had PEG tube in past, but would not want that now.  - Per [place] discussions, continue feedings for comfort as well as all other medical treatments.  - Continue anaerobic coverage with augmentin. Anticipate this to be a recurring and likely currently ongoing problem. WBC up today and possibly more congested. Strongly suspect recurrent aspiration event in the past 24 hours. Will support with augmentation of IVF and steroids, though this is illustrative of suspected course for this gentleman. - Appreciate SLP evaluation. - Suspect recurrent aspiration > pneumonitis despite abx as cause for reworsening leukocytosis.   Severe malignant cachexia, protein-calorie malnutrition, failure to thrive: BMI is 12. - Supplement protein as able by mouth.  - IVF's as below  Acute urinary retention: Possibly due to UTI.  - Foley catheter placed 1/21, pulled 1/24 and no  retention since.  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  - Dr. Alvy Bimler following patient. Has appointment 06/13/2018.  LUL, lingula squamous cell lung carcinoma: Undergone chemotherapy, radiation, resection: Latest PET in Aug 2019 showed increased LUL nodule and mildly increase lingular nodules (x2) suspicious for  metastases as well as unchange subcarinal LN consistent with metastasis.  - Per oncology, refractory to multiple treatments. Palliative/supportive plan of care pursued with no further chemotherapy based on frailty.  Hyperkalemia.  Mild, resolved with hydration.  - Monitor  Macrocytic anemia of chronic disease and low grade myelodysplastic syndrome:  - Suspect acute disease suppressing bone marrow activity, gave 1u PRBCs 1/23 with appropriate rise. Per oncology, pt has been getting aranesp q2 weeks and transfusion support as needed, usually every 4-6 weeks. - Continue folate, iron supplements. B12 level ok. - Follow up with oncology as outpatient.   Chronic HFrEF: EF relatively stable on repeat echo from 45-50% to 40-45%, limited by habitus and ability to cooperate with exam.  - Has some wall motion irregularities on echo. No chest pain, no ischemic ECG changes, and troponin negative, so no planned work up at this time. - Appears euvolemic/initially dehydrated. Plan to stop IVF's and monitor closely.  Seizure disorder:  - Continue keppra  Severe COPD with mild exacerbation.   - Continue on oxygen if needed and nebulizers - On chronic steroids for this, so will need prolonged taper. Switched to po 1/25 and tapered but he may have had a repeat aspiration event and is wheezing, will continue IV steroids.  Acute kidney injury likely secondary to sepsis and volume depletion.  Improving with IVF, but promptly worsened when IVF withdrawn.  - Will restart IV fluids tonight based on [place] discussions, though I doubt this is going to be helpful in his overall prognosis.  Debility, generalized weakness.   - PT evaluations have been ongoing.   Stage I sacral pressure injury: POA - Offload as able  DVT prophylaxis: Heparin subcutaneous Code Status: DNR Family Communication: Wife not at bedside this morning. Met with her, the patient's daughter and Bishop along with palliative care this PM.    Disposition Plan: As discussed during palliative care meeting today, I believe Adrian [name] is appropriate for residential hospice. He wants to go home, though it's not clear whether his wife is a) willing to convert to full comfort measures or b) able to support the patient enough at home. She will discuss it further with the patient and family.   Consultants:   Palliative care medicine  Oncology, Dr. Alvy Bimler  Procedures:   Echocardiogram 05/30/2018: - Left ventricle: The cavity size was normal. Systolic function was   mildly to moderately reduced. The estimated ejection fraction was   in the range of 40% to 45%. Hypokinesis of the inferior and   inferoseptal myocardium. Doppler parameters are consistent with   abnormal left ventricular relaxation (grade 1 diastolic   dysfunction). - Aortic valve: Transvalvular velocity was within the normal range.   There was no stenosis. There was moderate regurgitation.   Regurgitation pressure half-time: 321 ms. - Mitral valve: Transvalvular velocity was within the normal range.   There was no evidence for stenosis. There was no regurgitation. - Right ventricle: The cavity size was normal. Wall thickness was   normal. Systolic function was normal. - Atrial septum: No defect or patent foramen ovale was identified. - Tricuspid valve: There was mild-moderate regurgitation. - Pulmonary arteries: Systolic pressure was mildly increased. PA  peak pressure: 47 mm Hg (S).  Impressions: - Study limited by patient body habitus and inability to cooperate   with exam.  Antimicrobials:  Vancomycin 1/21  Cefepime, flagyl 1/21 - 1/25  Augmentin 1/25 >>   Subjective: This morning, Adrian [name] is laying quietly, flat in bed. Denies any problems. He has no pain or trouble breathing. Has not eaten much of anything. He wants to go home.  Objective: BP 115/76 (BP Location: Right Arm)   Pulse (!) 103   Temp 98.4 F (36.9 C) (Rectal)   Resp (!) 22   Ht  '6\' 1"'  (1.854 m)   Wt 43.4 kg   SpO2 91%   BMI 12.61 kg/m   Gen: Frail, tired-appearing male in no distress Pulm: Nonlabored breathing room air, no crackles. CV: Regular rate and rhythm. No murmur, rub, or gallop. No JVD, no dependent edema. GI: Abdomen soft, non-tender, non-distended, with normoactive bowel sounds.  Ext: Warm, no deformities Skin: No rashes, lesions or ulcers on visualized skin. Neuro: Alert, oriented. Shakes head yes/no to answer questions. No focal neurological deficits. Psych: Judgement and insight appear intact.  Data Reviewed: I have personally reviewed following labs and imaging studies  CBC: Recent Labs  Lab 06/01/18 0536 06/02/18 0535 06/03/18 0500 06/04/18 0618 06/05/18 0638  WBC 21.1* 20.9* 19.8* 26.0* 26.8*  HGB 9.1* 9.4* 10.0* 9.9* 9.6*  HCT 29.3* 30.4* 32.1* 31.8* 30.9*  MCV 114.9* 113.0* 112.6* 115.2* 116.2*  PLT 232 209 208 210 540   Basic Metabolic Panel: Recent Labs  Lab 06/01/18 0536 06/02/18 0535 06/03/18 0500 06/04/18 0618 06/05/18 0638  NA 139 135 136 138 139  K 4.2 4.4 4.6 5.0 5.0  CL 114* 110 112* 114* 114*  CO2 17* 18* 18* 17* 14*  GLUCOSE 135* 93 77 122* 85  BUN 37* 39* 44* 54* 70*  CREATININE 0.81 0.84 0.98 1.28* 1.57*  CALCIUM 8.3* 8.3* 8.4* 8.7* 8.5*   GFR: Estimated Creatinine Clearance: 27.3 mL/min (A) (by C-G formula based on SCr of 1.57 mg/dL (H)). Liver Function Tests: No results for input(s): AST, ALT, ALKPHOS, BILITOT, PROT, ALBUMIN in the last 168 hours. Urine analysis:    Component Value Date/Time   COLORURINE YELLOW 05/26/2018 0528   APPEARANCEUR CLOUDY (A) 06/07/2018 0528   LABSPEC 1.018 05/14/2018 0528   LABSPEC 1.015 01/24/2007 0930   PHURINE 8.0 06/02/2018 0528   GLUCOSEU NEGATIVE 05/23/2018 0528   HGBUR SMALL (A) 05/27/2018 0528   BILIRUBINUR NEGATIVE 06/06/2018 0528   BILIRUBINUR Negative 01/24/2007 0930   KETONESUR NEGATIVE 05/11/2018 0528   PROTEINUR >=300 (A) 05/19/2018 0528   UROBILINOGEN  1.0 07/06/2013 0917   NITRITE NEGATIVE 06/04/2018 0528   LEUKOCYTESUR MODERATE (A) 05/09/2018 0528   LEUKOCYTESUR Negative 01/24/2007 0930   Recent Results (from the past 240 hour(s))  Blood Culture (routine x 2)     Status: None   Collection Time: 05/25/2018  5:00 AM  Result Value Ref Range Status   Specimen Description   Final    BLOOD RIGHT ANTECUBITAL Performed at [facility], [place] [address] [place], [place] [address]    Special Requests   Final    BOTTLES DRAWN AEROBIC AND ANAEROBIC Blood Culture adequate volume Performed at [place] [address], [place], [place] [address]    Culture   Final    NO GROWTH 5 DAYS Performed at [facility], [place] [address] [place], [place] [address]    Report Status 06/03/2018 FINAL  Final  Blood Culture (routine x 2)     Status: None   Collection Time: 05/17/2018  5:10 AM  Result Value Ref Range Status   Specimen Description   Final    BLOOD BLOOD LEFT FOREARM Performed at [place] [address] [place], [place] [address]    Special Requests   Final    BOTTLES DRAWN AEROBIC AND ANAEROBIC Blood Culture results may not be optimal due to an excessive volume of blood received in culture bottles Performed at [place] [address] [place], [place] [address]    Culture   Final    NO GROWTH 5 DAYS Performed at [facility], [place] [address] [place], [place] [address]    Report Status 06/03/2018 FINAL  Final  Urine culture     Status: Abnormal   Collection Time: 05/21/2018  5:28 AM  Result Value Ref Range Status   Specimen Description   Final    URINE, CATHETERIZED Performed at [place] [address] [place], [place] [address]    Special Requests   Final    NONE Performed at [facility], [place] [address], [place], [place] [address]    Culture >=100,000 COLONIES/mL PROTEUS MIRABILIS (A)  Final     Report Status 05/31/2018 FINAL  Final   Organism ID, Bacteria PROTEUS MIRABILIS (A)  Final      Susceptibility   Proteus mirabilis - MIC*    AMPICILLIN <=2 SENSITIVE Sensitive     CEFAZOLIN <=4 SENSITIVE Sensitive     CEFTRIAXONE <=1 SENSITIVE Sensitive     CIPROFLOXACIN <=0.25 SENSITIVE Sensitive     GENTAMICIN >=16 RESISTANT Resistant     IMIPENEM 1 SENSITIVE Sensitive     NITROFURANTOIN 128 RESISTANT Resistant     TRIMETH/SULFA >=320 RESISTANT Resistant     AMPICILLIN/SULBACTAM <=2 SENSITIVE Sensitive     PIP/TAZO <=4 SENSITIVE Sensitive     * >=100,000 COLONIES/mL PROTEUS MIRABILIS  Culture, blood (routine x 2) Call MD if unable to obtain prior to antibiotics being given     Status: None   Collection Time: 05/20/2018  7:57 AM  Result Value Ref Range Status   Specimen Description   Final    BLOOD RIGHT ANTECUBITAL Performed at [place] [address] [place], [place] [address]    Special Requests   Final    BOTTLES DRAWN AEROBIC ONLY Blood Culture adequate volume Performed at [place] [address], [place], [place] [address]    Culture   Final    NO GROWTH 5 DAYS Performed at [facility], [place] [address], [place], [place] [address]    Report Status 06/03/2018 FINAL  Final  Culture, blood (routine x 2) Call MD if unable to obtain prior to antibiotics being given     Status: None   Collection Time: 05/12/2018  7:59 AM  Result Value Ref Range Status   Specimen Description   Final    BLOOD LEFT ARM Performed at [place] [address], [place], [place] [address]    Special Requests   Final    BOTTLES DRAWN AEROBIC ONLY Blood Culture adequate volume Performed at [place] [address] [place], [place] [address]    Culture   Final    NO GROWTH 5 DAYS Performed at [facility], [place] [address] [place], [place] [address]    Report Status 06/03/2018  FINAL  Final   MRSA PCR Screening     Status: None   Collection Time: 05/09/2018  8:03 AM  Result Value Ref Range Status   MRSA by PCR NEGATIVE NEGATIVE Final    Comment:        The GeneXpert MRSA Assay (FDA approved for NASAL specimens only), is one component of a comprehensive MRSA colonization surveillance program. It is not intended to diagnose MRSA infection nor to guide or monitor treatment for MRSA infections. Performed at [facility], [place] [address] [place], [place] [address]       Radiology Studies: No results found.  Scheduled Meds: . amoxicillin-clavulanate  1 tablet Oral Q12H  . Chlorhexidine Gluconate Cloth  6 each Topical Daily  . feeding supplement (ENSURE ENLIVE)  237 mL Oral BID BM  . ferrous sulfate  325 mg Oral Q breakfast  . folic acid  1 mg Oral Daily  . heparin  5,000 Units Subcutaneous Q8H  . levETIRAcetam  500 mg Oral BID  . mouth rinse  15 mL Mouth Rinse q12n4p  . methylPREDNISolone (SOLU-MEDROL) injection  40 mg Intravenous Q8H  . mirtazapine  7.5 mg Oral QHS  . multivitamin  15 mL Oral Daily  . sodium chloride flush  10-40 mL Intracatheter Q12H  . vitamin E  1,000 Units Oral Daily   Continuous Infusions: . sodium chloride       LOS: 7 days   Time spent: 35 minutes.  Patrecia Pour, MD [facility] [email or web address] Password [facility] 06/05/2018, 6:04 PM

## 2018-06-05 NOTE — Progress Notes (Signed)
OT Cancellation Note  Patient Details Name: Adrian [name] MRN: [id number] DOB: December 10, 1950   Cancelled Treatment:    Reason Eval/Treat Not Completed: Other (comment).  Visitor answered door and states that pt is sleeping at this time. Will check back another day  Derreck Wiltsey 06/05/2018, 3:31 PM  Lesle Chris, OTR/L Acute Rehabilitation Services [phone] [facility] pager [phone] office 06/05/2018

## 2018-06-05 NOTE — Progress Notes (Signed)
PHARMACY NOTE -  ANTIBIOTIC RENAL DOSE ADJUSTMENT   Patient has been initiated on augmentin for aspiration PNA. SCr 1.57, estimated CrCl 27 ml/min  Plan: decrease augmentin 875 bid to 500 mg bid for renal fxn. Eudelia Bunch, Pharm.D  [phone] 06/05/2018 7:55 AM

## 2018-06-05 NOTE — Progress Notes (Signed)
Nutrition Follow-up  DOCUMENTATION CODES:   Severe malnutrition in context of chronic illness, Underweight  INTERVENTION:   Continue Ensure Enlive po BID, each supplement provides 350 kcal and 20 grams of protein Continue liquid MVI daily  NUTRITION DIAGNOSIS:   Severe Malnutrition related to chronic illness, cancer and cancer related treatments as evidenced by severe fat depletion, severe muscle depletion.  Ongoing.  GOAL:   Patient will meet greater than or equal to 90% of their needs  Not meeting.  MONITOR:   PO intake, Supplement acceptance, Weight trends, Labs, I & O's ,GOC  ASSESSMENT:   68 y.o. male with past medical history of epiglottic cancer s/p radiation, lung s/p radiation and palliative chemo in the past (last chemo 12/2017), myelodysplastic syndrome on low-dose prednisone, RA, seizure disorder, COPD not on oxygen, and CHF. He presented to the ED with complaints of SOB for almost 3 hours. Patient complained of fever at home but denied any runny nose, congestion, chest pain, or sputum production. He does have mild cough.  Patient states that he occasionally has some difficulty swallowing and had a PEG tube in the past.  Patient not eating well. Scheduled for [place] meeting this afternoon. Per SLP evaluation 1/22, pt stated he did not want a feeding tube. Per MD note, pt may have been aspirating 1/27. Will continue supplements until goals of care is decided.  Weights have remained stable since admission.  Medications: Ferrous sulfate tablet daily, folic acid tablet daily, Remeron tablet daily, Liquid MVI daily, Vitamin E capsule daily Labs reviewed:  GFR: 51  Diet Order:   Diet Order            DIET SOFT Room service appropriate? Yes; Fluid consistency: Thin  Diet effective now              EDUCATION NEEDS:   No education needs have been identified at this time  Skin:  Skin Assessment: Reviewed RN Assessment  Last BM:  1/27  Height:   Ht Readings  from Last 1 Encounters:  06/01/18 6\' 1"  (1.854 m)    Weight:   Wt Readings from Last 1 Encounters:  06/05/18 43.4 kg    Ideal Body Weight:  83.64 kg  BMI:  Body mass index is 12.61 kg/m.  Estimated Nutritional Needs:   Kcal:  1750-1970 kcal  Protein:  80-96 grams (1.8-2.2 grams/kg)  Fluid:  >/= 1.8 L/day  Clayton Bibles, MS, RD, LDN [facility] [facility] Pager: [phone] After Hours Pager: [phone]

## 2018-06-05 NOTE — Progress Notes (Signed)
PT Cancellation Note  Patient Details Name: Adrian [name] MRN: [id number] DOB: 1950/07/20   Cancelled Treatment:    Reason Eval/Treat Not Completed: Fatigue/lethargy limiting ability to participate, patient sleeping. Check back another time.  [place] Pager [phone] Office [phone]  Claretha Cooper 06/05/2018, 5:19 PM

## 2018-06-06 NOTE — Progress Notes (Signed)
Patient continues to refused all oral intake and meds after numerous attempts.

## 2018-06-06 NOTE — Progress Notes (Signed)
Patient ID: Adrian [name], male   DOB: February 15, 1951, 68 y.o.   MRN: [id number]  This NP visited patient at the bedside as a follow up to  yesterday's [place] for palliative medicine needs and emotional support and to meet with patient's wife for continued [place] conversation.  Wife is with her daughter and her Bishop and requests to speak outside the room.  Patient is weak and only nodding head to questions, non verbal; does not communicate any pain or discomfort at this time.  Continued conversation in conference room regarding current medical situation, patient's continued failure to thrive regardless of medical interventions.  Discussed his underlying oncology diagnosis and recommendation for supportive care only.  We discussed the importance of medical decisions  specific to artificial feeding and hydration, continued diagnostics, and overall goals of cares and anticipatory care needs.  Detailed the risks and benefits of PEG tube, specifically as it relates to the reduction of  risk of aspiration.  Dr. Bonner Puna joined the conversation and continued conversation with patient's family regarding all of the above.  I offered education regarding the difference between an aggressive medical intervention path and a palliative comfort path for this patient at this time in this situation.  Detailed hospice benefit.  Patient is high risk for decompensation.   At this time family wish to continue with current medical interventions and remains hopeful for improvement.  Consideration of PEG placement has not been taken off the table for this family.  Patient's wife is not convinced that artificial feeding and hydration could possibly turn the situation around.  Questions and concerns addressed     Total time spent on the unit was 75 minutes  Greater than 50% of the time was spent in counseling and coordination of care  Wadie Lessen NP  Palliative Medicine Team Team Phone # [phone] Pager  [phone]

## 2018-06-06 NOTE — Progress Notes (Addendum)
PROGRESS NOTE  Adrian [name] QIW:[id number] DOB: 09/28/1950 DOA: 06/02/2018 PCP: [name], MD   LOS: 8 days   Brief narrative: Adrian [name] a 67 y.o.malewith past medical history of epiglottic cancer status post radiation, lung mass status post radiation and chemotherapy in the past (last chemotherapy August 2019), myelodysplastic syndrome on low-dose prednisone,rheumatoid arthritis, seizure disorder followed by oncology Dr. Alvy Bimler, COPDnot on oxygen, congestive heart failure ejection fraction of 45%, who presented to the hospital with complaints of shortness of breath for almost 3 hours prior to arrival to the ED.EMS was called in and patient was noted to be tachycardic, tachypneic with saturation in the 80sand was noted to be febrile. Patient was put on nonrebreather mask prior to arrival to the hospital. Patient complained of fever at home but denied any runny nose,congestion chest pain or sputum production.He does have mild cough. Patient states that he occasionally has some difficulty swallowing and used to have a PEG tube in the past. Patient denies any recent travel or sick contacts. Denies any nausea, vomiting or abdominal pain. Denies any diarrhea or constipation. Patient denies any urinary urgency, frequency or dysuria but had urinary retention in the ED and required a Foley catheter. Patient used to see pulmonary in the past but has not seen him recently and he is not on any inhalers.  ED Course:In the ED, patient patient was tachycardic,tachypneic and hypertensive.He was febrile with a maximum temperature of 101.60F.He was thought to have sepsis and was given 30 mls per KG IV fluid bolus. Pancultures were sent. Lactate on presentation was 4.5. WBC was elevated at 33,000. Arterial blood gas showed metabolic alkalosis from hyperventilation. Patient was noted to have infiltrate in the right middle lobe and urinalysis was abnormal. Patient was then  considered for admission to the hospital for sepsis likely secondary to pneumonia and UTI.  Assessment/Plan:  Principal Problem:   Pneumonia of right middle lobe due to infectious organism [facility]) Active Problems:   Anemia   MDS (myelodysplastic syndrome), low grade (HCC)   COPD, mild (HCC)   History of head and neck cancer   Hyperkalemia   Mild cognitive impairment   Dysphagia   Protein-calorie malnutrition, severe   Pressure injury of skin   Palliative care by specialist   DNR (do not resuscitate)   Adult failure to thrive  Sepsis due to PNA and Proteus UTI:  -Urine culture from 1/21 showed Proteus mirabilis more than 100,000 CFU per mL.  Continue oral Augmentin. - Monitor culture data (blood x2 from 1/21 remain NGTD)   Dysphagia, aspiration pneumonia: Has had PEG tube in past, but would not want that now.  - Per [place] discussions, continue feedings for comfort as well as all other medical treatments.  - Continue anaerobic coverage with augmentin. Anticipate this to be a recurring and likely currently ongoing problem. WBC trending up, 26,000 today.  Suspect chronic recurrent aspiration.  - Appreciate SLP evaluation.  Dysphagia 2 diet recommended.  Severe malignant cachexia, protein-calorie malnutrition, failure to thrive: BMI is 12. - Supplement protein as able by mouth.  - Continue IV fluid.  Acute urinary retention: Possibly due to UTI.  Creatinine trending up, that is 1.57 on last check yesterday.  Will repeat blood work tomorrow.  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  - Dr. Alvy Bimler following patient. Has appointment 06/13/2018.  LUL, lingula squamous cell lung carcinoma: Undergone chemotherapy, radiation, resection: Latest PET in Aug 2019 showed increased LUL nodule and  mildly increase lingular nodules (x2) suspicious for metastases as well as unchange subcarinal LN consistent with metastasis.  - Per oncology, refractory to  multiple treatments. Palliative/supportive plan of care pursued with no further chemotherapy based on frailty.  Hyperkalemia.  Mild, resolved with hydration. Monitor  Macrocytic anemia of chronic disease and low grade myelodysplastic syndrome:  - Suspect acute disease suppressing bone marrow activity, gave 1u PRBCs 1/23 with appropriate rise. Per oncology, pt has been getting aranesp q2 weeks and transfusion support as needed, usually every 4-6 weeks. - Continue folate, iron supplements. B12 level ok. - Follow up with oncology as outpatient.   Chronic HFrEF: EF relatively stable on repeat echo from 45-50% to 40-45%, limited by habitus and ability to cooperate with exam.  - Has some wall motion irregularities on echo. No chest pain, no ischemic ECG changes, and troponin negative, so no planned work up at this time.  Seizure disorder:  - Continue keppra  Severe COPD with mild exacerbation.   - Continue on oxygen if needed and nebulizers - On chronic steroids for this, so will need prolonged taper. Switched to po 1/25 and tapered but he may have had a repeat aspiration event and is wheezing, will continue IV steroids.  Acute kidney injury likely secondary to sepsis and volume depletion.  Improving with IVF, but promptly worsened when IVF withdrawn.  IV fluid was started.    Debility, generalized weakness. - PT evaluations have been ongoing.   Stage I sacral pressure injury: POA, Offload as able  VTE Prophylaxis: Heparin subcu    Code Status: DNR   Disposition Plan: Prognosis poor.  Palliative care consult appreciated.  Patient is a DNR but family does not think he is a hospice candidate yet.  Patient is fatigued to be seen by physical therapy.  I doubt if patient would be able to go back home.  Antibiotics: Antibiotics Given (last 72 hours)    Date/Time Action Medication Dose   06/03/18 2317 Given   amoxicillin-clavulanate (AUGMENTIN) 875-125 MG per tablet 1 tablet 1 tablet    06/04/18 2136 Given   amoxicillin-clavulanate (AUGMENTIN) 875-125 MG per tablet 1 tablet 1 tablet   06/05/18 1051 Given   amoxicillin-clavulanate (AUGMENTIN) 500-125 MG per tablet 500 mg 500 mg   06/05/18 2208 Given   amoxicillin-clavulanate (AUGMENTIN) 500-125 MG per tablet 500 mg 500 mg      Continuous Infusions: . sodium chloride 80 mL/hr at 06/06/18 0712    Scheduled Meds: . amoxicillin-clavulanate  1 tablet Oral Q12H  . Chlorhexidine Gluconate Cloth  6 each Topical Daily  . feeding supplement (ENSURE ENLIVE)  237 mL Oral BID BM  . ferrous sulfate  325 mg Oral Q breakfast  . folic acid  1 mg Oral Daily  . heparin  5,000 Units Subcutaneous Q8H  . levETIRAcetam  500 mg Oral BID  . mouth rinse  15 mL Mouth Rinse q12n4p  . methylPREDNISolone (SOLU-MEDROL) injection  40 mg Intravenous Q12H  . mirtazapine  7.5 mg Oral QHS  . multivitamin  15 mL Oral Daily  . sodium chloride flush  10-40 mL Intracatheter Q12H  . vitamin E  1,000 Units Oral Daily    PRN meds: acetaminophen, HYDROcodone-acetaminophen, ipratropium-albuterol, sodium chloride flush   Subjective: Patient was seen and examined this morning.  Not in distress.  Lethargic.  Opens eyes on verbal command.  Not in pain.  No family member at bedside at time of my evaluation  Objective: Vitals:   06/06/18 1015 06/06/18 1405  BP: 124/79 124/82  Pulse: 100 (!) 101  Resp: 18 18  Temp: (!) 97.1 F (36.2 C)   SpO2: 93% (!) 78%    Intake/Output Summary (Last 24 hours) at 06/06/2018 1408 Last data filed at 06/06/2018 8466 Gross per 24 hour  Intake -  Output 500 ml  Net -500 ml   Filed Weights   06/01/18 1638 06/04/18 0526 06/05/18 0604  Weight: 43.3 kg 42.2 kg 43.4 kg   Body mass index is 12.61 kg/m.   Physical Exam: GENERAL: Lethargic elderly African-American male HENT: No scleral pallor or icterus. Pupils equally reactive to light. Oral mucosa is moist NECK: is supple, no palpable thyroid enlargement. CHEST:  Clear to auscultation. No crackles or wheezes. Non tender on palpation. Diminished breath sounds bilaterally in the lower lobes. CVS: S1 and S2 heard, no murmur. Regular rate and rhythm. No pericardial rub. ABDOMEN: Soft, non-tender, bowel sounds are present. No palpable hepato-splenomegaly. EXTREMITIES: No edema. CNS: Opens eyes on verbal command at best SKIN: warm and dry without rashes.  Data Review: I have personally reviewed the laboratory data and studies available.  Adrian Croak, MD  [facility] 06/06/2018

## 2018-06-07 LAB — BASIC METABOLIC PANEL
Anion gap: 10 (ref 5–15)
BUN: 102 mg/dL — ABNORMAL HIGH (ref 8–23)
CO2: 12 mmol/L — ABNORMAL LOW (ref 22–32)
Calcium: 7.1 mg/dL — ABNORMAL LOW (ref 8.9–10.3)
Chloride: 125 mmol/L — ABNORMAL HIGH (ref 98–111)
Creatinine, Ser: 1.79 mg/dL — ABNORMAL HIGH (ref 0.61–1.24)
GFR calc non Af Amer: 38 mL/min — ABNORMAL LOW (ref >60)
GFR, EST AFRICAN AMERICAN: 44 mL/min — AB (ref >60)
Glucose, Bld: 66 mg/dL — ABNORMAL LOW (ref 70–99)
Potassium: 5 mmol/L (ref 3.5–5.1)
Sodium: 147 mmol/L — ABNORMAL HIGH (ref 135–145)

## 2018-06-07 LAB — CBC WITH DIFFERENTIAL/PLATELET
Abs Immature Granulocytes: 0.57 10*3/uL — ABNORMAL HIGH (ref 0.00–0.07)
Basophils Absolute: 0.1 10*3/uL (ref 0.0–0.1)
Basophils Relative: 0 %
Eosinophils Absolute: 0.1 10*3/uL (ref 0.0–0.5)
Eosinophils Relative: 0 %
HCT: 30.6 % — ABNORMAL LOW (ref 39.0–52.0)
Hemoglobin: 9.2 g/dL — ABNORMAL LOW (ref 13.0–17.0)
Immature Granulocytes: 2 %
Lymphocytes Relative: 1 %
Lymphs Abs: 0.2 10*3/uL — ABNORMAL LOW (ref 0.7–4.0)
MCH: 35.2 pg — ABNORMAL HIGH (ref 26.0–34.0)
MCHC: 30.1 g/dL (ref 30.0–36.0)
MCV: 117.2 fL — ABNORMAL HIGH (ref 80.0–100.0)
Monocytes Absolute: 0.4 10*3/uL (ref 0.1–1.0)
Monocytes Relative: 2 %
Neutro Abs: 25.4 10*3/uL — ABNORMAL HIGH (ref 1.7–7.7)
Neutrophils Relative %: 95 %
Platelets: 177 10*3/uL (ref 150–400)
RBC: 2.61 MIL/uL — ABNORMAL LOW (ref 4.22–5.81)
WBC: 26.7 10*3/uL — ABNORMAL HIGH (ref 4.0–10.5)
nRBC: 0.1 % (ref 0.0–0.2)

## 2018-06-07 NOTE — Progress Notes (Signed)
Patient ID: Adrian [name], male   DOB: [birth year], 68 y.o.   MRN: [id number]  This NP visited patient at the bedside as a follow up to  yesterday's [place] for palliative medicine needs and emotional support.  Patient is weak/lethargic and only nodding head to questions, non verbal; does not communicate any pain or discomfort at this time.  I encouraged patient to communicate with his family his desires and wishes at this time in his life as his body fails to thrive.  As discussed previously  family wish to continue with current medical interventions and remains hopeful for improvement.  Consideration of PEG placement has not been taken off the table for this family.  Patient's wife is not convinced that artificial feeding and hydration could possibly turn the situation around.  Conversation needs to be ongoing.   Discussed with Dr Milas Kocher  Total time spent on the unit was 15 minutes  Greater than 50% of the time was spent in counseling and coordination of care  Wadie Lessen NP  Palliative Medicine Team Team Phone # [phone] Pager [phone]

## 2018-06-07 NOTE — Progress Notes (Signed)
Briefly spoke with pt's wife following her conversation with providers this morning about pt's status. Wife aware overall status and prognosis very poor. Is considering hospice care versus seeking artifical feeding options. Wife states she would like to speak with a hospice provider in order to understand what services can be offered with a specific hospice agency. States she has to be at work but will call CSW back in order to move forward with CSW linking her with a hospice agency.  Sharren Bridge, MSW, LCSW Clinical Social Work 06/07/2018 [phone]

## 2018-06-07 NOTE — Progress Notes (Signed)
Physical Therapy Discharge Patient Details Name: Adrian [name] MRN: [id number] DOB: 1950-05-13 Today's Date: 06/07/2018 Time:  -     Patient discharged from PT services secondary to medical decline - will need to re-order PT to resume therapy services.  Please see latest therapy progress note for current level of functioning and progress toward goals.  The patient declined PT today. Has been extremely weak and unable to participate.Palliative  Medicine following.    GP     Claretha Cooper 06/07/2018, 10:04 AM Daniel Pager [phone] Office [phone]

## 2018-06-07 NOTE — Progress Notes (Signed)
OT Cancellation Note  Patient Details Name: Adrian [name] MRN: [id number] DOB: 01-22-1951   Cancelled Treatment:    Reason Eval/Treat Not Completed: Other (comment)  Signing off due to medical decline.   Benedict 06/07/2018, 10:26 AM  Lesle Chris, OTR/L Acute Rehabilitation Services [phone] [facility] pager [phone] office 06/07/2018

## 2018-06-07 NOTE — Progress Notes (Signed)
PROGRESS NOTE    ANTWOINE ZORN  ZOX:[id number] DOB: [birth year] DOA: 05/09/2018 PCP: [name], MD    Brief Narrative:  68 year old gentleman with history of epiglottic cancer status post radiation, lung mass status post radiation and chemotherapy in the past, last chemotherapy on August 2019, myelodysplastic syndrome, rheumatoid arthritis and multiple medical comorbidities including COPD, chronic diastolic heart failure and malignant cachexia who was admitted to the hospital with shortness of breath from home.  Patient was hypoxic, tachycardic and tachypneic on arrival.  He was found to have UTI and aspiration pneumonia as well as urinary retention.  Patient has advanced cachexia and is not doing appropriate clinical improvement.   Assessment & Plan:   Principal Problem:   Pneumonia of right middle lobe due to infectious organism [facility]) Active Problems:   Anemia   MDS (myelodysplastic syndrome), low grade (HCC)   COPD, mild (HCC)   History of head and neck cancer   Hyperkalemia   Mild cognitive impairment   Dysphagia   Protein-calorie malnutrition, severe   Pressure injury of skin   Palliative care by specialist   DNR (do not resuscitate)   Adult failure to thrive  Right lower lobe pneumonia: Suspected aspiration.  Patient was treated with IV antibiotics.  Currently remains on Augmentin.  Seen by speech.  On dysphagia 2 diet.  History of PEG tube in the past.  Currently patient with terminal condition, he will not benefit with PEG tube feeding.  Will continue Augmentin by mouth along with modified diet.  Sepsis due to pneumonia and Proteus UTI: On Augmentin.  Blood cultures were negative.  Severe malignant cachexia/protein calorie malnutrition/failure to thrive: His BMI is 12.  Does not have any good appetite.  Patient probably at end-of-life.  Acute urinary retention: Possibly due to UTI and immobility.  Does have Foley catheter.  He is not awake enough to participate in  voiding trial.  Continue Foley catheter even for for comfort care.  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  Followed by oncologist.  Seizure disorder: On Keppra.  Severe COPD with mild exacerbation: Remains on oxygen, bronchodilators and IV steroids.  Acute kidney injury secondary to sepsis and volume depletion: Improved until remains on IV fluids.  We will continue maintenance fluid.  Debility/Generalized weakness/failure to thrive and palliative discussion: Met with patient's wife at the bedside.  We discussed in length about patient's poor prognosis and likely short life left.  Patient's wife agrees with patient's overall poor outcome and discussion about hospice. She also brought discussion about feeding issues.  Suggested that and currently status, PEG tube procedure is not possible.  She wanted to discuss with hospice for considering and getting educated about inpatient hospice.  I advised her that we will continue IV fluids until she has discussion with hospice.  She may request to have NG tube and feeding.  Social worker notified to notify hospice consult.     DVT prophylaxis: Subcu heparin. Code Status: DNR/DNI. Family Communication: Wife at the bedside. Disposition Plan: Inpatient hospice if agreed.   Consultants:   None.  Procedures:   None.  Antimicrobials:   Augmentin to continue.   Subjective: Patient was seen and examined.  He was lethargic and hardly could keep up with conversation.  He will just not but no any conversation.  Wife at the bedside.  Did not eat any breakfast.  Objective: Vitals:   06/07/18 0206 06/07/18 0700 06/07/18 1037 06/07/18 1429  BP: 116/75  105/68 122/77  Pulse: 89  90 94  Resp: '20  16 15  ' Temp:   (!) 97.5 F (36.4 C) 97.7 F (36.5 C)  TempSrc:   Oral Oral  SpO2:   93% (!) 78%  Weight:  42.2 kg    Height:        Intake/Output Summary (Last 24 hours) at  06/07/2018 1549 Last data filed at 06/07/2018 0600 Gross per 24 hour  Intake 1823.57 ml  Output 550 ml  Net 1273.57 ml   Filed Weights   06/04/18 0526 06/05/18 0604 06/07/18 0700  Weight: 42.2 kg 43.4 kg 42.2 kg    Examination:  General exam: Appears calm , sick looking.  Cachectic. Respiratory system: Clear to auscultation.  In mild respiratory distress. Cardiovascular system: S1 & S2 heard, RRR. No JVD, murmurs, rubs, gallops or clicks. No pedal edema.  Tachycardic.  Port-A-Cath on the right chest. Gastrointestinal system: Abdomen is nondistended, soft and nontender. No organomegaly or masses felt. Normal bowel sounds heard. Central nervous system: Sleepy and lethargic.  No focal neurological deficits. Extremities: Generalized weakness, however equal in all extremities.   Skin: No rashes, lesions or ulcers Psychiatry: Flat affect.  Depressed mood.    Data Reviewed: I have personally reviewed following labs and imaging studies  CBC: Recent Labs  Lab 06/02/18 0535 06/03/18 0500 06/04/18 0618 06/05/18 0638 06/07/18 0500  WBC 20.9* 19.8* 26.0* 26.8* 26.7*  NEUTROABS  --   --   --   --  25.4*  HGB 9.4* 10.0* 9.9* 9.6* 9.2*  HCT 30.4* 32.1* 31.8* 30.9* 30.6*  MCV 113.0* 112.6* 115.2* 116.2* 117.2*  PLT 209 208 210 201 972   Basic Metabolic Panel: Recent Labs  Lab 06/02/18 0535 06/03/18 0500 06/04/18 0618 06/05/18 0638 06/07/18 0500  NA 135 136 138 139 147*  K 4.4 4.6 5.0 5.0 5.0  CL 110 112* 114* 114* 125*  CO2 18* 18* 17* 14* 12*  GLUCOSE 93 77 122* 85 66*  BUN 39* 44* 54* 70* 102*  CREATININE 0.84 0.98 1.28* 1.57* 1.79*  CALCIUM 8.3* 8.4* 8.7* 8.5* 7.1*   GFR: Estimated Creatinine Clearance: 23.2 mL/min (A) (by C-G formula based on SCr of 1.79 mg/dL (H)). Liver Function Tests: No results for input(s): AST, ALT, ALKPHOS, BILITOT, PROT, ALBUMIN in the last 168 hours. No results for input(s): LIPASE, AMYLASE in the last 168 hours. No results for input(s):  AMMONIA in the last 168 hours. Coagulation Profile: No results for input(s): INR, PROTIME in the last 168 hours. Cardiac Enzymes: No results for input(s): CKTOTAL, CKMB, CKMBINDEX, TROPONINI in the last 168 hours. BNP (last 3 results) No results for input(s): PROBNP in the last 8760 hours. HbA1C: No results for input(s): HGBA1C in the last 72 hours. CBG: No results for input(s): GLUCAP in the last 168 hours. Lipid Profile: No results for input(s): CHOL, HDL, LDLCALC, TRIG, CHOLHDL, LDLDIRECT in the last 72 hours. Thyroid Function Tests: No results for input(s): TSH, T4TOTAL, FREET4, T3FREE, THYROIDAB in the last 72 hours. Anemia Panel: No results for input(s): VITAMINB12, FOLATE, FERRITIN, TIBC, IRON, RETICCTPCT in the last 72 hours. Sepsis Labs: No results for input(s): PROCALCITON, LATICACIDVEN in the last 168 hours.  Recent Results (from the past 240 hour(s))  Blood Culture (routine x 2)     Status: None   Collection Time: 06/04/2018  5:00 AM  Result Value Ref Range Status   Specimen Description   Final    BLOOD RIGHT ANTECUBITAL Performed at  [facility], [place] [address] [place], [place] [address]    Special Requests   Final    BOTTLES DRAWN AEROBIC AND ANAEROBIC Blood Culture adequate volume Performed at [place] [address] [place], [place] [address]    Culture   Final    NO GROWTH 5 DAYS Performed at [facility], [place] [address] [place], [place] [address]    Report Status 06/03/2018 FINAL  Final  Blood Culture (routine x 2)     Status: None   Collection Time: 06/04/2018  5:10 AM  Result Value Ref Range Status   Specimen Description   Final    BLOOD BLOOD LEFT FOREARM Performed at [place] [address], [place], [place] [address]    Special Requests   Final    BOTTLES DRAWN AEROBIC AND ANAEROBIC Blood Culture results may not be optimal due to an excessive volume of blood received in culture  bottles Performed at [place] [address] [place], [place] [address]    Culture   Final    NO GROWTH 5 DAYS Performed at [facility], [place] [address], [place], [place] [address]    Report Status 06/03/2018 FINAL  Final  Urine culture     Status: Abnormal   Collection Time: 06/03/2018  5:28 AM  Result Value Ref Range Status   Specimen Description   Final    URINE, CATHETERIZED Performed at [place] [address], [place], [place] [address]    Special Requests   Final    NONE Performed at [facility], [place] [address], [place], [place] [address]    Culture >=100,000 COLONIES/mL PROTEUS MIRABILIS (A)  Final   Report Status 05/31/2018 FINAL  Final   Organism ID, Bacteria PROTEUS MIRABILIS (A)  Final      Susceptibility   Proteus mirabilis - MIC*    AMPICILLIN <=2 SENSITIVE Sensitive     CEFAZOLIN <=4 SENSITIVE Sensitive     CEFTRIAXONE <=1 SENSITIVE Sensitive     CIPROFLOXACIN <=0.25 SENSITIVE Sensitive     GENTAMICIN >=16 RESISTANT Resistant     IMIPENEM 1 SENSITIVE Sensitive     NITROFURANTOIN 128 RESISTANT Resistant     TRIMETH/SULFA >=320 RESISTANT Resistant     AMPICILLIN/SULBACTAM <=2 SENSITIVE Sensitive     PIP/TAZO <=4 SENSITIVE Sensitive     * >=100,000 COLONIES/mL PROTEUS MIRABILIS  Culture, blood (routine x 2) Call MD if unable to obtain prior to antibiotics being given     Status: None   Collection Time: 05/17/2018  7:57 AM  Result Value Ref Range Status   Specimen Description   Final    BLOOD RIGHT ANTECUBITAL Performed at [place] [address] [place], [place] [address]    Special Requests   Final    BOTTLES DRAWN AEROBIC ONLY Blood Culture adequate volume Performed at [place] [address] [place], [place] [address]    Culture   Final    NO GROWTH 5 DAYS Performed at [facility], [place] [address] [place], [place] [address]     Report Status 06/03/2018 FINAL  Final  Culture, blood (routine x 2) Call MD if unable to obtain prior to antibiotics being given     Status: None   Collection Time: 05/31/2018  7:59 AM  Result Value Ref Range Status   Specimen Description   Final    BLOOD LEFT  ARM Performed at [facility], [place] [address] [place], [place] [address]    Special Requests   Final    BOTTLES DRAWN AEROBIC ONLY Blood Culture adequate volume Performed at [place] [address] [place], [place] [address]    Culture   Final    NO GROWTH 5 DAYS Performed at [facility], [place] [address] [place], [place] [address]    Report Status 06/03/2018 FINAL  Final  MRSA PCR Screening     Status: None   Collection Time: 06/05/2018  8:03 AM  Result Value Ref Range Status   MRSA by PCR NEGATIVE NEGATIVE Final    Comment:        The GeneXpert MRSA Assay (FDA approved for NASAL specimens only), is one component of a comprehensive MRSA colonization surveillance program. It is not intended to diagnose MRSA infection nor to guide or monitor treatment for MRSA infections. Performed at [facility], [place] [address] [place], [place] [address]          Radiology Studies: No results found.      Scheduled Meds: . amoxicillin-clavulanate  1 tablet Oral Q12H  . Chlorhexidine Gluconate Cloth  6 each Topical Daily  . feeding supplement (ENSURE ENLIVE)  237 mL Oral BID BM  . ferrous sulfate  325 mg Oral Q breakfast  . folic acid  1 mg Oral Daily  . heparin  5,000 Units Subcutaneous Q8H  . levETIRAcetam  500 mg Oral BID  . mouth rinse  15 mL Mouth Rinse q12n4p  . methylPREDNISolone (SOLU-MEDROL) injection  40 mg Intravenous Q12H  . mirtazapine  7.5 mg Oral QHS  . multivitamin  15 mL Oral Daily  . sodium chloride flush  10-40 mL Intracatheter Q12H  . vitamin E  1,000 Units Oral Daily   Continuous Infusions: . sodium chloride 80 mL/hr at 06/07/18  0832     LOS: 9 days    Time spent: 35 minutes.    Barb Merino, MD [facility] Pager [phone]  If 7PM-7AM, please contact night-coverage [email or web address] Password [facility] 06/07/2018, 3:49 PM

## 2018-06-07 NOTE — Care Management Important Message (Signed)
Important Message  Patient Details  Name: Adrian [name] MRN: [id number] Date of Birth: [birth year]   Medicare Important Message Given:  Yes    Kerin Salen 06/07/2018, 9:18 AMImportant Message  Patient Details  Name: Adrian [name] MRN: [id number] Date of Birth: March 28, 1951   Medicare Important Message Given:  Yes    Kerin Salen 06/07/2018, 9:18 AM

## 2018-06-08 LAB — CBC WITH DIFFERENTIAL/PLATELET
Abs Immature Granulocytes: 1.18 10*3/uL — ABNORMAL HIGH (ref 0.00–0.07)
BASOS ABS: 0.1 10*3/uL (ref 0.0–0.1)
Basophils Relative: 0 %
EOS ABS: 0.1 10*3/uL (ref 0.0–0.5)
Eosinophils Relative: 0 %
HCT: 30.4 % — ABNORMAL LOW (ref 39.0–52.0)
Hemoglobin: 9.1 g/dL — ABNORMAL LOW (ref 13.0–17.0)
Immature Granulocytes: 3 %
Lymphocytes Relative: 0 %
Lymphs Abs: 0.1 10*3/uL — ABNORMAL LOW (ref 0.7–4.0)
MCH: 35.3 pg — AB (ref 26.0–34.0)
MCHC: 29.9 g/dL — ABNORMAL LOW (ref 30.0–36.0)
MCV: 117.8 fL — ABNORMAL HIGH (ref 80.0–100.0)
Monocytes Absolute: 0.5 10*3/uL (ref 0.1–1.0)
Monocytes Relative: 1 %
NEUTROS PCT: 96 %
Neutro Abs: 33.5 10*3/uL — ABNORMAL HIGH (ref 1.7–7.7)
Platelets: 113 10*3/uL — ABNORMAL LOW (ref 150–400)
RBC: 2.58 MIL/uL — ABNORMAL LOW (ref 4.22–5.81)
WBC: 35.4 10*3/uL — ABNORMAL HIGH (ref 4.0–10.5)
nRBC: 0.1 % (ref 0.0–0.2)

## 2018-06-08 LAB — BASIC METABOLIC PANEL
Anion gap: 10 (ref 5–15)
BUN: 123 mg/dL — ABNORMAL HIGH (ref 8–23)
CHLORIDE: 129 mmol/L — AB (ref 98–111)
CO2: 11 mmol/L — ABNORMAL LOW (ref 22–32)
Calcium: 6.8 mg/dL — ABNORMAL LOW (ref 8.9–10.3)
Creatinine, Ser: 2.04 mg/dL — ABNORMAL HIGH (ref 0.61–1.24)
GFR calc Af Amer: 37 mL/min — ABNORMAL LOW (ref >60)
GFR calc non Af Amer: 32 mL/min — ABNORMAL LOW (ref >60)
Glucose, Bld: 33 mg/dL — CL (ref 70–99)
Potassium: 5.5 mmol/L — ABNORMAL HIGH (ref 3.5–5.1)
SODIUM: 150 mmol/L — AB (ref 135–145)

## 2018-06-08 LAB — GLUCOSE, CAPILLARY
GLUCOSE-CAPILLARY: 35 mg/dL — AB (ref 70–99)
GLUCOSE-CAPILLARY: 17 mg/dL — AB (ref 70–99)
Glucose-Capillary: 148 mg/dL — ABNORMAL HIGH (ref 70–99)
Glucose-Capillary: 29 mg/dL — CL (ref 70–99)
Glucose-Capillary: 268 mg/dL — ABNORMAL HIGH (ref 70–99)
Glucose-Capillary: 160 mg/dL — ABNORMAL HIGH (ref 70–99)
Glucose-Capillary: 111 mg/dL — ABNORMAL HIGH (ref 70–99)
Glucose-Capillary: 169 mg/dL — ABNORMAL HIGH (ref 70–99)
Glucose-Capillary: 51 mg/dL — ABNORMAL LOW (ref 70–99)
Glucose-Capillary: 56 mg/dL — ABNORMAL LOW (ref 70–99)
Glucose-Capillary: 81 mg/dL (ref 70–99)

## 2018-06-08 MED ORDER — DEXTROSE 50 % IV SOLN
25.0000 g | INTRAVENOUS | Status: AC
  Administered 2018-06-08: 25 g via INTRAVENOUS

## 2018-06-08 MED ORDER — DEXTROSE 50 % IV SOLN
25.0000 g | INTRAVENOUS | Status: AC
  Administered 2018-06-08: 25 mL via INTRAVENOUS

## 2018-06-08 MED ORDER — DEXTROSE 50 % IV SOLN
12.5000 g | INTRAVENOUS | Status: AC
  Administered 2018-06-08: 12.5 g via INTRAVENOUS

## 2018-06-08 MED ORDER — DEXTROSE 50 % IV SOLN: 25.0000 g | INTRAVENOUS | Status: AC

## 2018-06-08 MED ORDER — DEXTROSE 50 % IV SOLN
INTRAVENOUS | Status: AC
  Administered 2018-06-08: 12.5 g via INTRAVENOUS
  Filled 2018-06-08: qty 50

## 2018-06-08 MED ORDER — DEXTROSE 50 % IV SOLN
INTRAVENOUS | Status: AC
  Filled 2018-06-08: qty 50

## 2018-06-08 MED ORDER — DEXTROSE 50 % IV SOLN
INTRAVENOUS | Status: AC
  Administered 2018-06-08: 25 mL via INTRAVENOUS
  Filled 2018-06-08: qty 50

## 2018-06-08 MED ORDER — DEXTROSE 50 % IV SOLN
50.0000 mL | Freq: Once | INTRAVENOUS | Status: AC
  Administered 2018-06-08: 50 mL via INTRAVENOUS

## 2018-06-08 MED ORDER — DEXTROSE-NACL 5-0.9 % IV SOLN
INTRAVENOUS | Status: DC
  Administered 2018-06-08 – 2018-06-09 (×2): via INTRAVENOUS

## 2018-06-08 NOTE — Significant Event (Signed)
Rapid Response Event Note  Overview: Time Called: 0813 Arrival Time: 0815 Event Type: Other (Comment)(Hypoglycemia ) Notified by bedside RN due to unable to resolve hypoglycemia, current CBG 35 upon arrival. One entire amp of dextrose 50% given through port-a-cath. CBG was checked from finger. MD Dahal had already been paged in regards to.  Initial Focused Assessment: Neuro: Alert and oriented x 3, patient oriented to self, situation and place. Patient able to follow simple commands. Patient very weak but able to squeeze hands and wiggle toes bilaterally. Equal in strength bilaterally as well. Cardiac: NSR, HR 70-80s, pulses 1+ in radial and pedal locations bilaterally. BP WNL Respiratory: breath sounds clear and diminished throughout lung fields, O2 Sats difficult to obtain from finger tips, O2 Sat reading per ear lobe-88-91%, RR 19-21, patient in no apparent respiratory distress.   Interventions: Checked blood glucose via central access before second amp of dextrose given, patient's blood glucose reading was 148 at 0827. MD Dahal ordered to still give the second amp of dextrose 50%. After checking the blood glucose from central access, immediately checked blood glucose from finger stick and result was 17 at 0831. Rechecked blood glucose via central access, reading was 268 at 0852- flushed with 86mls of normal saline, wasted 59mls before checking blood glucose.   Plan of Care (if not transferred): Recommend checking blood sugar via PAC due to poor capillary circulation and hypoglycemia protocol based on central access blood glucose readings. Call rapid response if patient's clinical status changes or declines.       Bernis Stecher C

## 2018-06-08 NOTE — Progress Notes (Signed)
MD paged to room, full amp D50 given per MD order. Pt able to nod appropriately, lethargic. Rapid response at bedside. MD to update family.

## 2018-06-08 NOTE — Progress Notes (Signed)
Hypoglycemic Event  CBG: 29  Treatment: 1/2 amp D50  Symptoms: none, pt alert  Follow-up CBG: Time: 0815   CBG Result: 35  Possible Reasons for Event: poor PO intake   Comments/MD notified: MD notified     Adrian [name]

## 2018-06-08 NOTE — Progress Notes (Addendum)
PROGRESS NOTE  Adrian [name]:[id number] DOB: 02-10-1951 DOA: 05/12/2018 PCP: [name], MD   LOS: 10 days   Brief narrative: Patient is a 68 year old gentleman with history of epiglottic cancer status post radiation, lung mass status post radiation and chemotherapy in the past, last chemotherapy on August 2019, myelodysplastic syndrome, rheumatoid arthritis and multiple medical comorbidities including COPD, chronic diastolic heart failure and malignant cachexia who was admitted to the hospital with shortness of breath from home. Patient was hypoxic, tachycardic and tachypneic on arrival.  He was found to have UTI and aspiration pneumonia as well as urinary retention.  Patient has advanced cachexia and is not showing appropriate clinical improvement.  Assessment/Plan:  Principal Problem:   Pneumonia of right middle lobe due to infectious organism [facility]) Active Problems:   Anemia   MDS (myelodysplastic syndrome), low grade (HCC)   COPD, mild (HCC)   History of head and neck cancer   Hyperkalemia   Mild cognitive impairment   Dysphagia   Protein-calorie malnutrition, severe   Pressure injury of skin   Palliative care by specialist   DNR (do not resuscitate)   Adult failure to thrive  Right lower lobe pneumonia: Suspected aspiration.  Patient was treated with IV antibiotics.  Currently remains on Augmentin.  Seen by speech.  On dysphagia 2 diet.  History of PEG tube in the past.  Currently patient with terminal condition, he will not benefit with PEG tube feeding.  Will continue Augmentin by mouth along with modified diet.  Sepsis due to pneumonia and Proteus UTI: On Augmentin.  Blood cultures were negative.  Severe malignant cachexia/protein calorie malnutrition/failure to thrive: His BMI is 12.  Does not have any good appetite.  Patient probably at end-of-life.  Acute urinary retention: Possibly due to UTI and immobility.  Does have Foley catheter.  He is not awake  enough to participate in voiding trial.  Continue Foley catheter even for comfort care.  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  Followed by oncologist.  Seizure disorder: On Keppra.  Severe COPD with mild exacerbation: Remains on oxygen, bronchodilators and IV steroids.  Acute kidney injury secondary to sepsis and volume depletion: Improved until remains on IV fluids.  We will continue maintenance fluid.  Hypoglycemic episodes -blood sugar dropped to 50s this morning and this afternoon.  Dextrose ampules given.  Patient has no appetite at all.  He is already on IV steroids.  We will start the patient on dextrose drip.  Debility/Generalized weakness/failure to thrive and palliative discussion: I met with patient's wife at the bedside.  I reinforced that tube feeding is not going to modify patient's ultimate outcome  and it would most likely further increase the risk of aspiration.  Wife is willing to consider hospice services.  Palliative care team following as well.  DVT prophylaxis: Subcu heparin. Code Status: DNR/DNI. Family Communication: Wife at the bedside. Disposition Plan: Inpatient hospice if accepted.  Antibiotics: Antibiotics Given (last 72 hours)    Date/Time Action Medication Dose   06/05/18 2208 Given   amoxicillin-clavulanate (AUGMENTIN) 500-125 MG per tablet 500 mg 500 mg     Continuous Infusions:  . sodium chloride 80 mL/hr at 06/08/18 0800    Scheduled Meds: . amoxicillin-clavulanate  1 tablet Oral Q12H  . Chlorhexidine Gluconate Cloth  6 each Topical Daily  . dextrose      . feeding supplement (ENSURE ENLIVE)  237 mL Oral BID BM  . ferrous  sulfate  325 mg Oral Q breakfast  . folic acid  1 mg Oral Daily  . heparin  5,000 Units Subcutaneous Q8H  . levETIRAcetam  500 mg Oral BID  . mouth rinse  15 mL Mouth Rinse q12n4p  . methylPREDNISolone (SOLU-MEDROL) injection  40 mg Intravenous Q12H    . mirtazapine  7.5 mg Oral QHS  . multivitamin  15 mL Oral Daily  . sodium chloride flush  10-40 mL Intracatheter Q12H  . vitamin E  1,000 Units Oral Daily    PRN meds: acetaminophen, HYDROcodone-acetaminophen, ipratropium-albuterol, sodium chloride flush   Subjective: Patient was seen and examined this morning.  Pleasant elderly African-American male.  Just able to nod his head to answer questions.  Unable to have a conversation.  Wife is at bedside.  Patient does not seem to be in distress.  Objective: Vitals:   06/08/18 0840 06/08/18 1242  BP: 111/70 120/84  Pulse:  84  Resp: (!) 21 15  Temp:    SpO2: 91% (!) 79%    Intake/Output Summary (Last 24 hours) at 06/08/2018 1352 Last data filed at 06/08/2018 1000 Gross per 24 hour  Intake 2004.37 ml  Output 700 ml  Net 1304.37 ml   Filed Weights   06/05/18 0604 06/07/18 0700 06/08/18 0451  Weight: 43.4 kg 42.2 kg 41.2 kg   Body mass index is 11.98 kg/m.   Physical Exam: GENERAL: Elderly cachectic African-American male HENT: No scleral pallor or icterus. Pupils equally reactive to light. Oral mucosa is moist NECK: is supple, no palpable thyroid enlargement. CHEST: Clear to auscultation. No crackles or wheezes. Non tender on palpation. Diminished breath sounds bilaterally. CVS: S1 and S2 heard, no murmur. Regular rate and rhythm. No pericardial rub. ABDOMEN: Soft, non-tender, bowel sounds are present. No palpable hepato-splenomegaly. EXTREMITIES: No edema. CNS: Alert, opens eyes on verbal command, nods head to some questions. SKIN: warm and dry without rashes.  Data Review: I have personally reviewed the laboratory data and studies available.  Terrilee Croak, MD  [facility] 06/08/2018

## 2018-06-08 NOTE — Progress Notes (Signed)
MD notified for MEWS score change d/t temperature. Rectal temperature was obtained and it is 93.5.  He had a low temperature once today, but dayshift reported having difficulty obtaining temps.  MD was notified. Looking into bear hugger, but patient would require ICU.Roderick Pee

## 2018-06-08 NOTE — Progress Notes (Signed)
Hypoglycemic Event  CBG: 56  Treatment: 1/2 amp D50   Symptoms: lethargic   Follow-up CBG: Time: 1700   CBG Result:169  Possible Reasons for Event: poor PO intake   Comments/MD notified: MD notified.   Pt alert x3. No acute changes from previous assessment. Both specimens drawn from central line access. No distress noted, will continue to monitor.    Rueben Bash

## 2018-06-08 NOTE — Progress Notes (Addendum)
CBG 148, drawn from [facility]. Peripheral CBG 17. Will continue to draw from central access for CBGs if discrepancy noted from peripheral draw.

## 2018-06-08 NOTE — Progress Notes (Signed)
[facility])  Received request from LCSW to provide information to pt spouse surrounding options for hospice support.  Provided general hospice information as well as what [facility] has to offer.  At this point, Adrian [name] is just "looking at all of the options".  Advised her she can reach out to [place] with any questions or concerns.  Thank you, Venia Carbon BSN, [facility] Liaison (listed in [place]) [phone]

## 2018-06-08 NOTE — Progress Notes (Signed)
Hypoglycemic Event  CBG: 33  Treatment: 1/2 amp D50   Symptoms: none, pt alert  Follow-up CBG: Time: 0800   CBG Result:29  Possible Reasons for Event: poor PO intake   Comments/MD notified: MD notified [place]

## 2018-06-09 ENCOUNTER — Encounter: Payer: Self-pay | Admitting: Hematology and Oncology

## 2018-06-09 LAB — GLUCOSE, CAPILLARY
GLUCOSE-CAPILLARY: 56 mg/dL — AB (ref 70–99)
Glucose-Capillary: 161 mg/dL — ABNORMAL HIGH (ref 70–99)
Glucose-Capillary: 163 mg/dL — ABNORMAL HIGH (ref 70–99)
Glucose-Capillary: 92 mg/dL (ref 70–99)

## 2018-06-09 LAB — CBC WITH DIFFERENTIAL/PLATELET
Abs Immature Granulocytes: 2.51 10*3/uL — ABNORMAL HIGH (ref 0.00–0.07)
BASOS ABS: 0.2 10*3/uL — AB (ref 0.0–0.1)
Basophils Relative: 0 %
EOS PCT: 0 %
Eosinophils Absolute: 0.1 10*3/uL (ref 0.0–0.5)
HCT: 29.9 % — ABNORMAL LOW (ref 39.0–52.0)
Hemoglobin: 8.6 g/dL — ABNORMAL LOW (ref 13.0–17.0)
Immature Granulocytes: 6 %
Lymphocytes Relative: 0 %
Lymphs Abs: 0.1 10*3/uL — ABNORMAL LOW (ref 0.7–4.0)
MCH: 36.1 pg — ABNORMAL HIGH (ref 26.0–34.0)
MCHC: 28.8 g/dL — AB (ref 30.0–36.0)
MCV: 125.6 fL — ABNORMAL HIGH (ref 80.0–100.0)
Monocytes Absolute: 0.5 10*3/uL (ref 0.1–1.0)
Monocytes Relative: 1 %
NRBC: 0.3 % — AB (ref 0.0–0.2)
Neutro Abs: 41.1 10*3/uL — ABNORMAL HIGH (ref 1.7–7.7)
Neutrophils Relative %: 93 %
Platelets: 48 10*3/uL — ABNORMAL LOW (ref 150–400)
RBC: 2.38 MIL/uL — ABNORMAL LOW (ref 4.22–5.81)
WBC: 44.4 10*3/uL — ABNORMAL HIGH (ref 4.0–10.5)

## 2018-06-09 LAB — BASIC METABOLIC PANEL
BUN: 130 mg/dL — AB (ref 8–23)
CO2: 14 mmol/L — ABNORMAL LOW (ref 22–32)
Calcium: 6.6 mg/dL — ABNORMAL LOW (ref 8.9–10.3)
Chloride: >130 mmol/L (ref 98–111)
Creatinine, Ser: 2.09 mg/dL — ABNORMAL HIGH (ref 0.61–1.24)
GFR calc Af Amer: 36 mL/min — ABNORMAL LOW (ref >60)
GFR calc non Af Amer: 31 mL/min — ABNORMAL LOW (ref >60)
Glucose, Bld: 171 mg/dL — ABNORMAL HIGH (ref 70–99)
Potassium: 6.4 mmol/L (ref 3.5–5.1)
Sodium: 152 mmol/L — ABNORMAL HIGH (ref 135–145)

## 2018-06-09 MED ORDER — DEXTROSE 50 % IV SOLN
INTRAVENOUS | Status: AC
  Administered 2018-06-09: 50 mL
  Filled 2018-06-09: qty 50

## 2018-06-09 MED ORDER — MORPHINE SULFATE (PF) 2 MG/ML IV SOLN: 2.0000 mg | INTRAVENOUS | Status: DC | PRN

## 2018-06-09 MED ORDER — SODIUM CHLORIDE 0.9 % IV BOLUS
1000.0000 mL | Freq: Once | INTRAVENOUS | Status: AC
  Administered 2018-06-09: 1000 mL via INTRAVENOUS

## 2018-06-09 MED ORDER — DEXTROSE 50 % IV SOLN: 25.0000 g | INTRAVENOUS | Status: AC

## 2018-06-09 DEATH — deceased

## 2018-06-11 ENCOUNTER — Other Ambulatory Visit: Payer: Self-pay | Admitting: Hematology and Oncology

## 2018-06-13 ENCOUNTER — Inpatient Hospital Stay: Payer: Medicare Other | Admitting: Hematology and Oncology

## 2018-06-13 ENCOUNTER — Inpatient Hospital Stay: Payer: Medicare Other

## 2018-06-22 ENCOUNTER — Ambulatory Visit: Payer: Medicare Other | Admitting: Podiatry

## 2018-07-08 NOTE — Discharge Summary (Addendum)
DEATH SUMMARY  Adrian [name]:[id number] DOB: 1951/01/16 DOA: 06/04/2018  PCP: [name], MD  Admit date: 05/31/2018 Discharge date: 05-Jul-2018  Admitted From: Home  Discharge disposition: Deceased   Hospital Course:   Patient is a68 year old gentleman with history of epiglottic cancer status post radiation, lung mass status post radiation and chemotherapy in the past, last chemotherapy on August 2019, myelodysplastic syndrome, rheumatoid arthritis and multiple medical comorbidities including COPD, chronic diastolic heart failure and malignant cachexia who was admitted to the hospital with shortness of breath from home. Patient was hypoxic, tachycardic and tachypneic on arrival. He was found to have UTI and aspiration pneumonia as well as urinary retention. Patient has advanced cachexia and is notshowingappropriate clinical improvement. Patient has been made comfort care this morning.    Medical Consultants:    None.   Discharge Exam:    Procedures and Diagnostic Studies:   Dg Chest Port 1 View  Result Date: 05/28/2018 CLINICAL DATA:  Initial evaluation for acute shortness of breath, fever. EXAM: PORTABLE CHEST 1 VIEW COMPARISON:  Prior radiograph from 05/23/2018 FINDINGS: Right-sided Port-A-Cath in place, stable. Cardiac and mediastinal silhouettes are unchanged, and may within normal limits. Lungs are hyperinflated with severe emphysematous changes. Approximate 2.7 cm spiculated nodule at the left upper lobe, corresponding with no bronchogenic carcinoma, stable. There are increased hazy opacities within the right mid lung as compared to previous, suspicious for infiltrates given history of fever. Irregular pleuroparenchymal scarring at the right upper lobe similar. Underlying interstitial congestion without overt pulmonary edema. No pleural effusion. No pneumothorax. No acute osseous finding. IMPRESSION: 1. Patchy and hazy opacity within the mid right lung,  concerning for infectious infiltrate given the provided history of fever. 2. Grossly similar spiculated left upper lobe nodule, compatible with known bronchogenic carcinoma. 3. Underlying advanced emphysema. Electronically Signed   By: Jeannine Boga M.D.   On: 05/30/2018 05:51   Dg Swallowing Func-speech Pathology  Result Date: 05/26/2018 Objective Swallowing Evaluation: Type of Study: MBS-Modified Barium Swallow Study  Patient Details Name: Adrian [name] MRN: [id number] Date of Birth: 1951/02/26 Today's Date: 05/25/2018 Time: SLP Start Time (ACUTE ONLY): 1607 -SLP Stop Time (ACUTE ONLY): 1359 SLP Time Calculation (min) (ACUTE ONLY): 53 min Past Medical History: Past Medical History: Diagnosis Date . Anxiety  . Arthritis   rheumatoid . Cancer ([place]) 03/11/2013  larnyx/epiglottic Squamous cell in situ . Emphysema of lung ([place])  . Esophageal reflux   not current . History of blood transfusion  . History of radiation therapy 05/13/2013-06/28/2013  68 gray to epiglottis/neck . History of radiation therapy 04/06/17-04/18/17  right SBRT lung 50 Gy in 5 fractions . Megaloblastic anemia  . Pneumonia 01/2013 . Seizures ([place])   last one was 8 years ago- 2008 . Shortness of breath   with exertion . Throat pain 06/13/2013 Past Surgical History: Past Surgical History: Procedure Laterality Date . ABDOMINAL HERNIA REPAIR   . COLONOSCOPY   . IR FLUORO GUIDE PORT INSERTION RIGHT  08/31/2016 . IR US GUIDE VASC ACCESS RIGHT  08/31/2016 . MINOR PLACEMENT OF FIDUCIAL  08/10/2016  Procedure: MINOR PLACEMENT OF FIDUCIAL x3;  Surgeon: Collene Gobble, MD;  Location: [facility];  Service: Thoracic;; . MULTIPLE EXTRACTIONS WITH ALVEOLOPLASTY N/A 04/16/2013  Procedure: Extraction of tooth #'s 2,5,6,7,10,11,15,17,21,22,27,29 with alveoloplasty, bilateral maxillary fibrous tuberosity reductions, removal of excess tissues of mandibular arch, and mandibular left lingual exostoses reductions.;  Surgeon: Lenn Cal, DDS;  Location: [facility];  Service:  Oral Surgery;  Laterality: N/A; .  PEG PLACEMENT  2014 . tracheostomy  2014 . VIDEO BRONCHOSCOPY WITH ENDOBRONCHIAL NAVIGATION N/A 08/10/2016  Procedure: VIDEO BRONCHOSCOPY WITH ENDOBRONCHIAL NAVIGATION;  Surgeon: Collene Gobble, MD;  Location: [facility];  Service: Thoracic;  Laterality: N/A; HPI: 68 yo male adm to [facility] with AMS and fever, due to possible UTI and findings concerning for pna *right middle lobe per imaging study.  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  Pt has previously had a PEG and a bronch 08/10/2016.  He and wife deny pt seeing an SLP for dysphagia management with his pharyngeal cancer.  Also PMH with + seizure, myleodysplastic syndrome.  Cachexia per oncologist note with decreased ability to consume po.  Swallow evaluation ordered.  Pt has lost weight but has not had recent pna until now per his wife.  Wife reports pt having pill dysphagia and some difficulties with meats.  Subjective: pt awake in chair Assessment / Plan / Recommendation CHL IP CLINICAL IMPRESSIONS 06/06/2018 Clinical Impression Pt presents with moderate oral and gross pharyngeal = cervical esophageal dysphagia resulting in delayed oral transiting, piecemealing, lingual pumping and premature spillage.  Very poor pharyngeal/tongue base motility noted secondary to fibrosis from XRT in addition to ? absence or severely limited in size epiglottis.  Pt aspirated thin, nectar, honey due to delayed swallow, poor epiglottic deflection and decreased motility.  Barium spills over small epiglottis into open airway and aspiration was largely silent,  He also aspirated secretions that were retained in pharynx and pyriform sinus- mixing with barium.  Aspiration occurred before, during and after the swallow - largely silent unless gross or reached level of the carina.  Cued and reflexive cough did not clear aspirates due to weakness/inadequate vocal fold closure.  Intermittently removed penetrates of liquids into pharynx where he re-aspirated from re-spillage into airway.   Various postures including head turn with and without chin tuck not helpful to decrease aspiration.  This patient is aspirating secretions at this point and this will not be prevented.  Nectar via tsp with less pharyngeal residuals due to decreased bolus size.  Pt with less residuals with pudding and solid than liquids  = presumed due to increased neuromuscular input prompting stronger pharyngeal swallow and increased duration of UES opening.   Pt advised SLP that he enjoys eating despite level of dysphagia/coughing with intake and there are not plans for a PEG tube placement again per pt.  SLP will educate family/pt regarding compensation strategies to mitigate aspiration.  Intake should be understood for comfort not nutritional support as pt's dysphagia, chronic aspiration prohibits this expectation.  With pt's current level of dysphagia, metastatic cancer s/p chemo, anticipate he will continue to lose weight with increased asp pna risk.  Highly recommend a palliative consult to establish goals of care/plan as the weaker this pt becomes the increased pulmonary ramification risks will be present. SLP Visit Diagnosis Dysphagia, pharyngeal phase (R13.13);Dysphagia, oropharyngeal phase (R13.12);Dysphagia, pharyngoesophageal phase (R13.14) Attention and concentration deficit following -- Frontal lobe and executive function deficit following -- Impact on safety and function Risk for inadequate nutrition/hydration;Severe aspiration risk   CHL IP TREATMENT RECOMMENDATION 06/08/2018 Treatment Recommendations F/U MBS in --- days (Comment)   Prognosis 05/11/2018 Prognosis for Safe Diet Advancement Fair Barriers to Reach Goals Severity of deficits;Behavior Barriers/Prognosis Comment -- CHL IP DIET RECOMMENDATION 05/12/2018 SLP Diet Recommendations Other (Comment) Liquid Administration via Cup;Straw Medication  Administration Crushed with puree Compensations Multiple dry swallows  after each bite/sip;Other (Comment);Small sips/bites;Slow rate Postural Changes Remain semi-upright after after feeds/meals (Comment);Seated upright at 90 degrees   CHL IP OTHER RECOMMENDATIONS 06/01/2018 Recommended Consults -- Oral Care Recommendations Oral care BID Other Recommendations --   CHL IP FOLLOW UP RECOMMENDATIONS 06/06/2018 Follow up Recommendations (No Data)   CHL IP FREQUENCY AND DURATION 06/02/2018 Speech Therapy Frequency (ACUTE ONLY) min 1 x/week Treatment Duration 1 week      CHL IP ORAL PHASE 05/24/2018 Oral Phase Impaired Oral - Pudding Teaspoon -- Oral - Pudding Cup -- Oral - Honey Teaspoon -- Oral - Honey Cup Weak lingual manipulation;Lingual pumping;Reduced posterior propulsion;Decreased bolus cohesion Oral - Nectar Teaspoon Weak lingual manipulation;Lingual pumping;Reduced posterior propulsion;Decreased bolus cohesion Oral - Nectar Cup Weak lingual manipulation;Lingual pumping;Decreased bolus cohesion Oral - Nectar Straw -- Oral - Thin Teaspoon Weak lingual manipulation;Lingual pumping;Delayed oral transit;Decreased bolus cohesion Oral - Thin Cup Weak lingual manipulation;Lingual pumping;Reduced posterior propulsion;Delayed oral transit;Decreased bolus cohesion Oral - Thin Straw Weak lingual manipulation;Lingual pumping;Reduced posterior propulsion;Delayed oral transit;Decreased bolus cohesion Oral - Puree Weak lingual manipulation;Lingual pumping;Reduced posterior propulsion Oral - Mech Soft -- Oral - Regular -- Oral - Multi-Consistency -- Oral - Pill -- Oral Phase - Comment lingual pumping, delayed oral transiting, decreased bolus cohesion and residuals noted  CHL IP PHARYNGEAL PHASE 06/05/2018 Pharyngeal Phase Impaired Pharyngeal- Pudding Teaspoon -- Pharyngeal -- Pharyngeal- Pudding Cup -- Pharyngeal -- Pharyngeal- Honey Teaspoon Reduced pharyngeal peristalsis;Reduced epiglottic inversion;Reduced anterior laryngeal  mobility;Reduced laryngeal elevation;Reduced airway/laryngeal closure;Reduced tongue base retraction;Penetration/Aspiration during swallow;Penetration/Apiration after swallow;Moderate aspiration;Pharyngeal residue - valleculae;Pharyngeal residue - pyriform Pharyngeal Material enters airway, passes BELOW cords without attempt by patient to eject out (silent aspiration) Pharyngeal- Honey Cup -- Pharyngeal -- Pharyngeal- Nectar Teaspoon Reduced pharyngeal peristalsis;Reduced epiglottic inversion;Reduced anterior laryngeal mobility;Reduced laryngeal elevation;Reduced airway/laryngeal closure;Reduced tongue base retraction;Penetration/Aspiration during swallow;Penetration/Apiration after swallow;Moderate aspiration;Significant aspiration (Amount);Pharyngeal residue - valleculae;Pharyngeal residue - pyriform Pharyngeal Material enters airway, remains ABOVE vocal cords and not ejected out Pharyngeal- Nectar Cup -- Pharyngeal -- Pharyngeal- Nectar Straw Reduced pharyngeal peristalsis;Reduced epiglottic inversion;Reduced anterior laryngeal mobility;Reduced laryngeal elevation;Reduced airway/laryngeal closure;Reduced tongue base retraction;Penetration/Aspiration during swallow;Penetration/Apiration after swallow;Significant aspiration (Amount);Pharyngeal residue - valleculae;Pharyngeal residue - pyriform Pharyngeal Material enters airway, passes BELOW cords and not ejected out despite cough attempt by patient;Material enters airway, passes BELOW cords without attempt by patient to eject out (silent aspiration) Pharyngeal- Thin Teaspoon Reduced pharyngeal peristalsis;Reduced epiglottic inversion;Reduced anterior laryngeal mobility;Reduced laryngeal elevation;Reduced airway/laryngeal closure;Reduced tongue base retraction;Penetration/Aspiration during swallow;Penetration/Apiration after swallow;Moderate aspiration Pharyngeal Material enters airway, passes BELOW cords without attempt by patient to eject out (silent aspiration)  Pharyngeal- Thin Cup Reduced epiglottic inversion;Reduced anterior laryngeal mobility;Reduced laryngeal elevation;Reduced airway/laryngeal closure;Reduced tongue base retraction;Penetration/Aspiration during swallow;Penetration/Apiration after swallow;Moderate aspiration;Significant aspiration (Amount);Pharyngeal residue - valleculae;Pharyngeal residue - pyriform Pharyngeal Material enters airway, passes BELOW cords and not ejected out despite cough attempt by patient;Material enters airway, passes BELOW cords without attempt by patient to eject out (silent aspiration) Pharyngeal- Thin Straw Reduced pharyngeal peristalsis;Reduced epiglottic inversion;Reduced anterior laryngeal mobility;Reduced laryngeal elevation;Reduced airway/laryngeal closure;Reduced tongue base retraction;Penetration/Aspiration during swallow;Penetration/Apiration after swallow;Moderate aspiration;Pharyngeal residue - valleculae;Pharyngeal residue - pyriform Pharyngeal Material enters airway, passes BELOW cords and not ejected out despite cough attempt by patient;Material enters airway, passes BELOW cords without attempt by patient to eject out (silent aspiration) Pharyngeal- Puree Reduced pharyngeal peristalsis;Reduced epiglottic inversion;Reduced anterior laryngeal mobility;Reduced laryngeal elevation;Reduced airway/laryngeal closure;Reduced tongue base retraction;Penetration/Apiration after swallow;Pharyngeal residue - valleculae;Pharyngeal residue - pyriform Pharyngeal -- Pharyngeal- Mechanical Soft -- Pharyngeal -- Pharyngeal- Regular -- Pharyngeal -- Pharyngeal- Multi-consistency -- Pharyngeal --  Pharyngeal- Pill -- Pharyngeal -- Pharyngeal Comment chin tuck, head turn left/right with chin tuck did not decrease residuals, chin tuck worsened oral transiting delay  CHL IP CERVICAL ESOPHAGEAL PHASE 05/17/2018 Cervical Esophageal Phase Impaired, upon esophageal sweep - distal esophagus appeared clear Pudding Teaspoon -- Pudding Cup -- Honey  Teaspoon -- Honey Cup -- Nectar Teaspoon -- Nectar Cup -- Nectar Straw -- Thin Teaspoon -- Thin Cup -- Thin Straw -- Puree -- Mechanical Soft -- Regular -- Multi-consistency -- Pill -- Cervical Esophageal Comment -- Macario Golds 05/16/2018, 2:52 PM  Luanna Salk, MS [facility] SLP Acute Rehab Services Pager [phone] Office [phone]               Labs:   Basic Metabolic Panel: Recent Labs  Lab 06/04/18 0618 06/05/18 0638 06/07/18 0500 06/08/18 0629 07-07-18 0653  NA 138 139 147* 150* 152*  K 5.0 5.0 5.0 5.5* 6.4*  CL 114* 114* 125* 129* >130*  CO2 17* 14* 12* 11* 14*  GLUCOSE 122* 85 66* 33* 171*  BUN 54* 70* 102* 123* 130*  CREATININE 1.28* 1.57* 1.79* 2.04* 2.09*  CALCIUM 8.7* 8.5* 7.1* 6.8* 6.6*   GFR Estimated Creatinine Clearance: 19.4 mL/min (A) (by C-G formula based on SCr of 2.09 mg/dL (H)). Liver Function Tests: No results for input(s): AST, ALT, ALKPHOS, BILITOT, PROT, ALBUMIN in the last 168 hours. No results for input(s): LIPASE, AMYLASE in the last 168 hours. No results for input(s): AMMONIA in the last 168 hours. Coagulation profile No results for input(s): INR, PROTIME in the last 168 hours.  CBC: Recent Labs  Lab 06/04/18 0618 06/05/18 0638 06/07/18 0500 06/08/18 0629 07-07-18 0653  WBC 26.0* 26.8* 26.7* 35.4* 44.4*  NEUTROABS  --   --  25.4* 33.5* 41.1*  HGB 9.9* 9.6* 9.2* 9.1* 8.6*  HCT 31.8* 30.9* 30.6* 30.4* 29.9*  MCV 115.2* 116.2* 117.2* 117.8* 125.6*  PLT 210 201 177 113* 48*   Cardiac Enzymes: No results for input(s): CKTOTAL, CKMB, CKMBINDEX, TROPONINI in the last 168 hours. BNP: Invalid input(s): POCBNP CBG: Recent Labs  Lab 06/08/18 2002 07-07-2018 0024 July 07, 2018 0418 07-Jul-2018 0515 July 07, 2018 0808  GLUCAP 81 92 56* 161* 163*   D-Dimer No results for input(s): DDIMER in the last 72 hours. Hgb A1c No results for input(s): HGBA1C in the last 72 hours. Lipid Profile No results for input(s): CHOL, HDL, LDLCALC, TRIG, CHOLHDL,  LDLDIRECT in the last 72 hours. Thyroid function studies No results for input(s): TSH, T4TOTAL, T3FREE, THYROIDAB in the last 72 hours.  Invalid input(s): FREET3 Anemia work up No results for input(s): VITAMINB12, FOLATE, FERRITIN, TIBC, IRON, RETICCTPCT in the last 72 hours. Microbiology No results found for this or any previous visit (from the past 240 hour(s)).  Signed:  Terrilee Croak  [facility] 07-07-2018, 2:47 PM

## 2018-07-08 NOTE — Progress Notes (Signed)
NP on call attempted to contact family with no success. Will continue to follow MEWS.  ICU has no available beds at this time.  NP will attempt family again in a few minutes.Adrian [name]

## 2018-07-08 NOTE — Progress Notes (Signed)
Patient appears increasingly more lethargic.  His breathing has periods of apnea.  Notified MD that it appears he is actively dying.Adrian [name]

## 2018-07-08 NOTE — Progress Notes (Signed)
This RN called to room at 1207 by family, patient with no heart beat or respirations. Time of death verified with two RN, [name] and Dena Billet, RN.

## 2018-07-08 NOTE — Progress Notes (Signed)
Contacted patients wife regarding patients goals of care. Pt has poor prognosis and has significantly declined throughout the night. He has become hypothermic requiring a Retail banker and also become hypotensive receiving a 1L NS Bolus. Pt has also become more lethargic throughout the night. Spoke with pts wife regarding his decline and she does not want to progress care any further at this time. Would also like to discuss comfort care with daytime provider.  [name] AGPCNP-BC, AGNP-C [facility] Pager [phone]

## 2018-07-08 NOTE — Progress Notes (Signed)
Multiple visits during the night evaluating pt status and contacting NP with pt RN concerns.  Pt has been lethargic how ever following some simple commands and arousable.  Bair Huggar still applied.  Pt temperature remains low.  Contact made to pt family r/t goals of care.  Pt family at the bedside.

## 2018-07-08 NOTE — Progress Notes (Signed)
Spoke with on call NP. NP contacted wife.  Wife reported to continue with bear hugger.  She did not want boluses administered or transfer to ICU,  but did want bear hugger and blood sugars addressed until further discussion this am.  Clarified with NP on call that it was ok to change vitals to q4h at this time.Roderick Pee

## 2018-07-08 NOTE — Progress Notes (Signed)
PROGRESS NOTE  Adrian [name] BPZ:[id number] DOB: [birth year] DOA: 05/11/2018 PCP: [name], MD   LOS: 11 days   Brief narrative: Patient is a 68 year old gentleman with history of epiglottic cancer status post radiation, lung mass status post radiation and chemotherapy in the past, last chemotherapy on August 2019, myelodysplastic syndrome, rheumatoid arthritis and multiple medical comorbidities including COPD, chronic diastolic heart failure and malignant cachexia who was admitted to the hospital with shortness of breath from home. Patient was hypoxic, tachycardic and tachypneic on arrival. He was found to have UTI and aspiration pneumonia as well as urinary retention. Patient has advanced cachexia and is not showing appropriate clinical improvement.  Patient has been made comfort care this morning.  Assessment/Plan:  Principal Problem:   Pneumonia of right middle lobe due to infectious organism [facility]) Active Problems:   Anemia   MDS (myelodysplastic syndrome), low grade (HCC)   COPD, mild (HCC)   History of head and neck cancer   Hyperkalemia   Mild cognitive impairment   Dysphagia   Protein-calorie malnutrition, severe   Pressure injury of skin   Palliative care by specialist   DNR (do not resuscitate)   Adult failure to thrive  Right lower lobe pneumonia: Suspected aspiration. Patient was treated with IV antibiotics. Currently remains on Augmentin however unable to take any thing by mouth.  Patient is actively dying at this stage.   Sepsis due to pneumonia and Proteus UTI:On Augmentin. Blood cultures were negative.  Severe malignant cachexia/protein calorie malnutrition/failure to thrive:His BMI is 12. Does not have any good appetite.   Acute urinary retention:Possibly due to UTI and immobility.   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   Seizure disorder: On Keppra however unable to  take orally.  Severe COPD with mild exacerbation:Remains on oxygen, bronchodilators and IV steroids.  Acute kidney injury secondary to sepsis and volume depletion: Improved with IV fluids.  IV fluids have been stopped at this time as comfort care measures started.    Hypoglycemic episodes -stop checking blood sugar now  Debility/Generalized weakness/failure to thrive and palliative discussion: Patient has been made comfort care this morning.  DVT prophylaxis:Subcu heparin. Code Status:DNR/DNI. Family Communication:Wife at the bedside. Disposition Plan:Comfort care this morning.  Antibiotics: Antibiotics Given (last 72 hours)    Date/Time Action Medication Dose   06/08/18 2227 Given   amoxicillin-clavulanate (AUGMENTIN) 500-125 MG per tablet 500 mg 500 mg     Scheduled Meds: . amoxicillin-clavulanate  1 tablet Oral Q12H  . Chlorhexidine Gluconate Cloth  6 each Topical Daily  . feeding supplement (ENSURE ENLIVE)  237 mL Oral BID BM  . ferrous sulfate  325 mg Oral Q breakfast  . folic acid  1 mg Oral Daily  . heparin  5,000 Units Subcutaneous Q8H  . levETIRAcetam  500 mg Oral BID  . mouth rinse  15 mL Mouth Rinse q12n4p  . methylPREDNISolone (SOLU-MEDROL) injection  40 mg Intravenous Q12H  . mirtazapine  7.5 mg Oral QHS  . multivitamin  15 mL Oral Daily  . sodium chloride flush  10-40 mL Intracatheter Q12H  . vitamin E  1,000 Units Oral Daily    PRN meds: acetaminophen, HYDROcodone-acetaminophen, ipratropium-albuterol, morphine injection, sodium chloride flush   Subjective: Patient was seen and examined this morning.  Actively dying elderly African-American male.  Wife and daughter at bedside.  Patient is gasping on 100% nonrebreather oxygen.  Eyes half open.  Unable to respond.  Objective: Vitals:  July 08, 2018 0624 08-Jul-2018 0700  BP: (!) 79/48   Pulse: 92   Resp: (!) 24   Temp:  (!) 95.4 F (35.2 C)  SpO2: 97%     Intake/Output Summary (Last 24 hours) at  Jul 08, 2018 1004 Last data filed at 07/08/18 0414 Gross per 24 hour  Intake 2493.75 ml  Output 750 ml  Net 1743.75 ml   Filed Weights   06/05/18 0604 06/07/18 0700 06/08/18 0451  Weight: 43.4 kg 42.2 kg 41.2 kg   Body mass index is 11.98 kg/m.   Physical Exam: GENERAL: Actively dying elderly male, HENT: No scleral pallor or icterus. Pupils equally reactive to light. Oral mucosa is moist NECK: is supple, no palpable thyroid enlargement. CHEST: Clear to auscultation. No crackles or wheezes. Non tender on palpation. Diminished breath sounds bilaterally. CVS: S1 and S2 heard, no murmur. Regular rate and rhythm. No pericardial rub. ABDOMEN: Soft, non-tender, bowel sounds are present. No palpable hepato-splenomegaly. EXTREMITIES: No edema. CNS: Tries to open eyes on verbal command as the best response. SKIN: warm and dry without rashes.  Data Review: I have personally reviewed the laboratory data and studies available.  Terrilee Croak, MD  [facility] July 08, 2018

## 2018-07-08 NOTE — Progress Notes (Signed)
MEWS Guidelines - (patients age 71 and over)  Red - At High Risk for Deterioration Yellow - At risk for Deterioration  1. Go to room and assess patient 2. Validate data. Is this patient's baseline? If data confirmed: 3. Is this an acute change? 4. Administer prn meds/treatments as ordered. 5. Note Sepsis score 6. Review goals of care 7. Sports coach, RRT nurse and Provider. 8. Ask Provider to come to bedside.  9. Document patient condition/interventions/response. 10. Increase frequency of vital signs and focused assessments to at least q15 minutes x 4, then q30 minutes x2. - If stable, then q1h x3, then q4h x3 and then q8h or dept. routine. - If unstable, contact Provider & RRT nurse. Prepare for possible transfer. 11. Add entry in progress notes using the smart phrase ".MEWS". 1. Go to room and assess patient 2. Validate data. Is this patient's baseline? If data confirmed: 3. Is this an acute change? 4. Administer prn meds/treatments as ordered? 5. Note Sepsis score 6. Review goals of care 7. Sports coach and Provider 8. Call RRT nurse as needed. 9. Document patient condition/interventions/response. 10. Increase frequency of vital signs and focused assessments to at least q2h x2. - If stable, then q4h x2 and then q8h or dept. routine. - If unstable, contact Provider & RRT nurse. Prepare for possible transfer. 11. Add entry in progress notes using the smart phrase ".MEWS".  Green - Likely stable Lavender - Comfort Care Only  1. Continue routine/ordered monitoring.  2. Review goals of care. 1. Continue routine/ordered monitoring. 2. Review goals of care.    MD, [facility], and RRT RN were all contacted.  Patient is on bear hugger. Will monitor patient frequently. Adrian [name]

## 2018-07-08 NOTE — Progress Notes (Signed)
MD paged to see patient and talk to family concerning changing patient status to full comfort measures as patient is actively dying at this time. Will continue to monitor.

## 2018-07-08 NOTE — Progress Notes (Signed)
MEWS Guidelines - (patients age 70 and over)  Red - At High Risk for Deterioration Yellow - At risk for Deterioration  1. Go to room and assess patient 2. Validate data. Is this patient's baseline? If data confirmed: 3. Is this an acute change? 4. Administer prn meds/treatments as ordered. 5. Note Sepsis score 6. Review goals of care 7. Sports coach, RRT nurse and Provider. 8. Ask Provider to come to bedside.  9. Document patient condition/interventions/response. 10. Increase frequency of vital signs and focused assessments to at least q15 minutes x 4, then q30 minutes x2. - If stable, then q1h x3, then q4h x3 and then q8h or dept. routine. - If unstable, contact Provider & RRT nurse. Prepare for possible transfer. 11. Add entry in progress notes using the smart phrase ".MEWS". 1. Go to room and assess patient 2. Validate data. Is this patient's baseline? If data confirmed: 3. Is this an acute change? 4. Administer prn meds/treatments as ordered? 5. Note Sepsis score 6. Review goals of care 7. Sports coach and Provider 8. Call RRT nurse as needed. 9. Document patient condition/interventions/response. 10. Increase frequency of vital signs and focused assessments to at least q2h x2. - If stable, then q4h x2 and then q8h or dept. routine. - If unstable, contact Provider & RRT nurse. Prepare for possible transfer. 11. Add entry in progress notes using the smart phrase ".MEWS".  Green - Likely stable Lavender - Comfort Care Only  1. Continue routine/ordered monitoring.  2. Review goals of care. 1. Continue routine/ordered monitoring. 2. Review goals of care.    Patient MEWS score is 4 at this time. Temperature 93.5, CBG 56, RR 24.  Patient is lethargic but still responsive.  RRT RN on floor and MD notified. MD reported he is calling family. Roderick Pee

## 2018-07-08 DEATH — deceased

## 2018-12-10 IMAGING — MR MR HEAD WO/W CM
10 of 14 series · 33 of 48 positions shown · IV contrast (multihance)
Comparison: Head CT 06/21/2014.  MRI 01/30/2014.

CLINICAL DATA: Staging lung cancer.

EXAM:
MRI HEAD WITHOUT AND WITH CONTRAST
TECHNIQUE: Multiplanar, multiecho pulse sequences of the brain and surrounding
structures were obtained without and with intravenous contrast.
CONTRAST:  10mL MULTIHANCE GADOBENATE DIMEGLUMINE 529 MG/ML IV SOLN

[Series 4: DWI · axial · 3.0mm · 1.09mm/px · z∈[-21,+132]mm · 8 of 104 slices shown (1 of 4)]
[im 1/104]
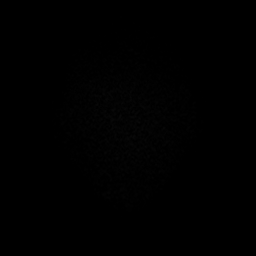
[im 15/104]
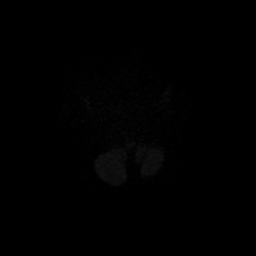
[im 30/104]
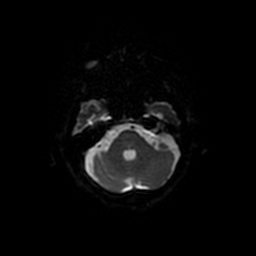
[im 45/104]
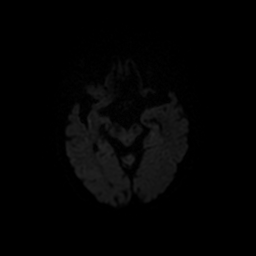
[im 59/104]
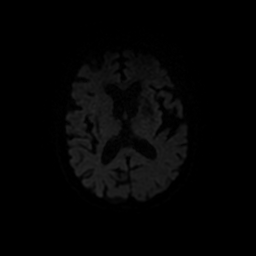
[im 74/104]
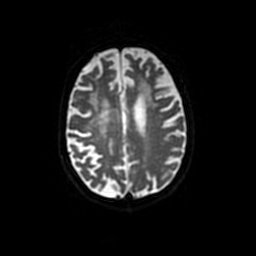
[im 89/104]
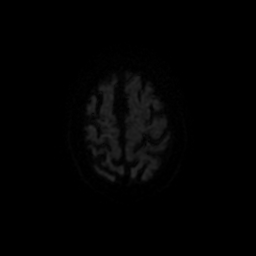
[im 104/104]
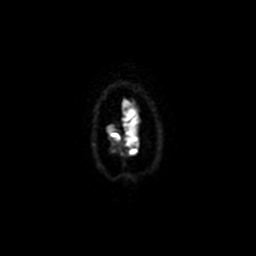

[Series 5: DWI · coronal · 5.0mm · 1.09mm/px · 6 of 70 slices shown (2 of 4)]
[im 1/70]
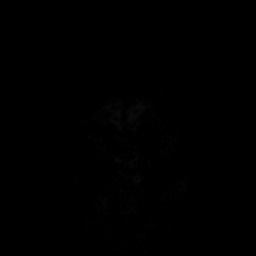
[im 14/70]
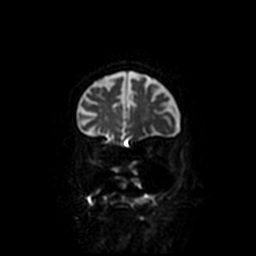
[im 28/70]
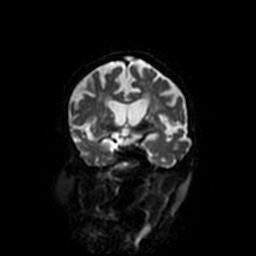
[im 42/70]
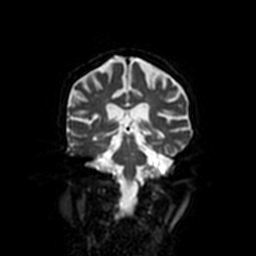
[im 56/70]
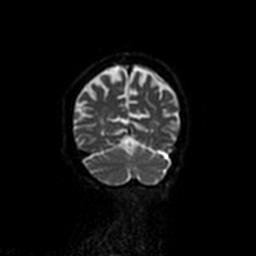
[im 70/70]
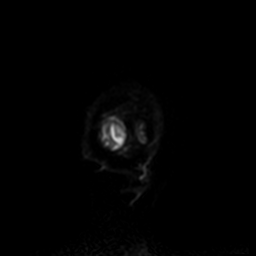

[Series 6: T2 · axial · 5.0mm · 0.43mm/px · z∈[-27,+135]mm · 2 of 26 slices shown]
[im 1/26]
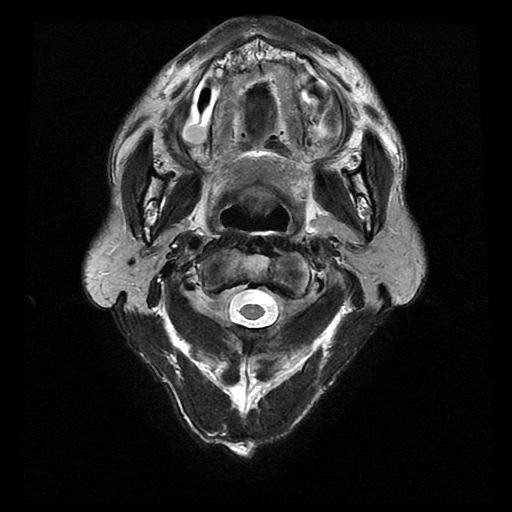
[im 26/26]
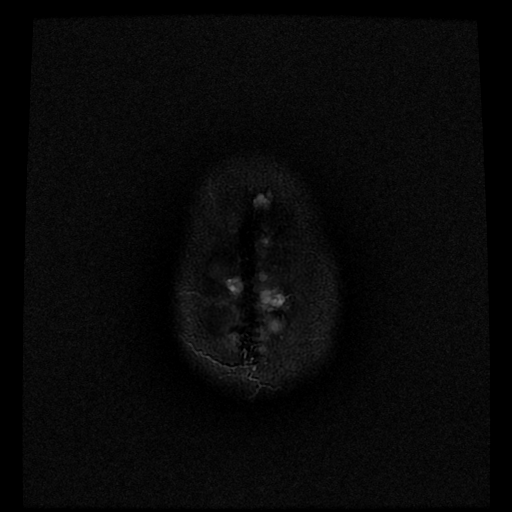

[Series 7: FLAIR · axial · 5.0mm · 0.43mm/px · z∈[-35,+140]mm · 2 of 26 slices shown]
[im 1/26]
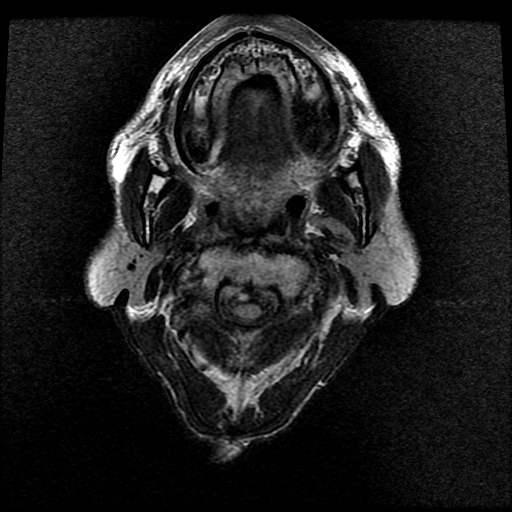
[im 26/26]
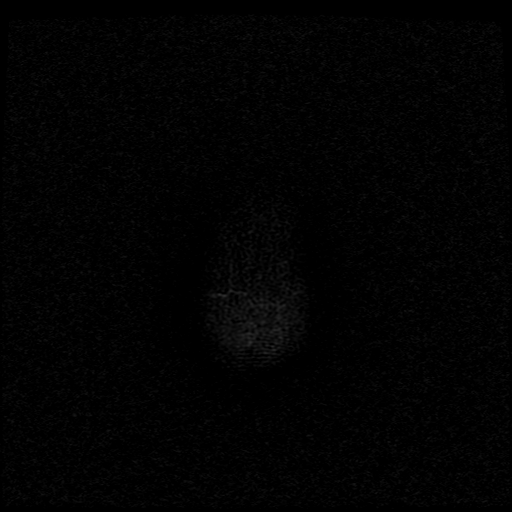

[Series 10: T2 post-contrast · coronal · 5.0mm · 0.45mm/px · 2 of 27 slices shown (1 of 2)]
[im 1/27]
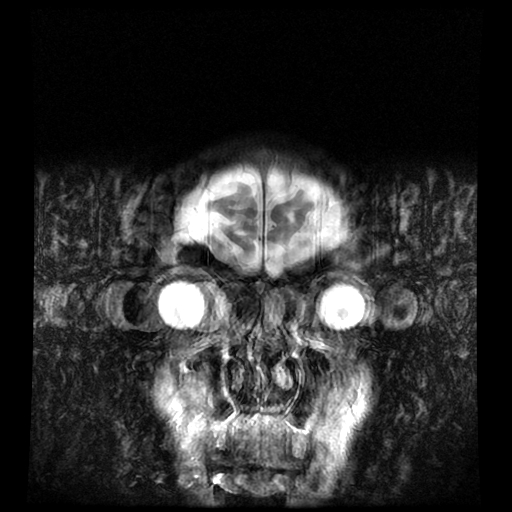
[im 27/27]
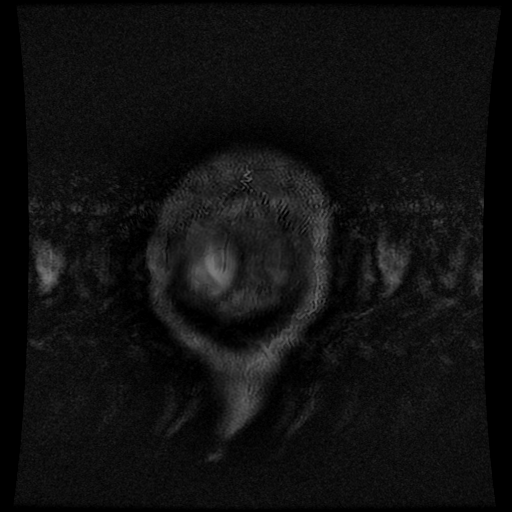

[Series 12: T1 post-contrast · coronal · 5.0mm · 0.45mm/px · 2 of 27 slices shown (1 of 2)]
[im 1/27]
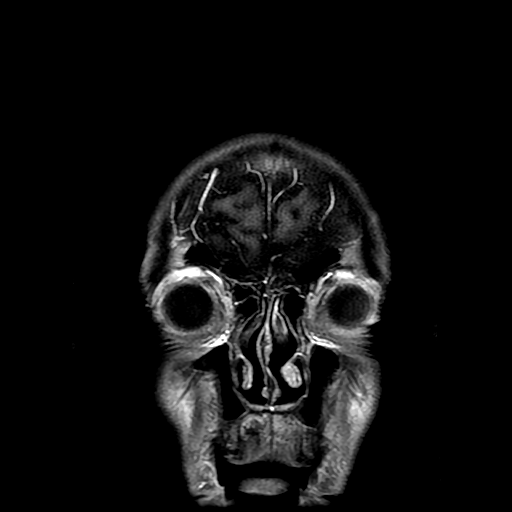
[im 27/27]
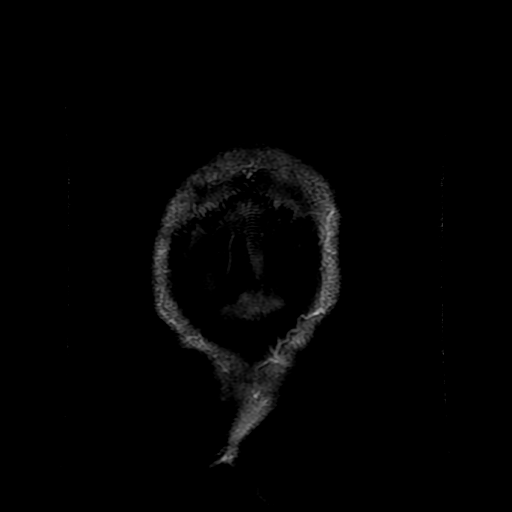

[Series 13: T1 post-contrast · sagittal · 5.0mm · 0.47mm/px · 2 of 24 slices shown (2 of 2)]
[im 1/24]
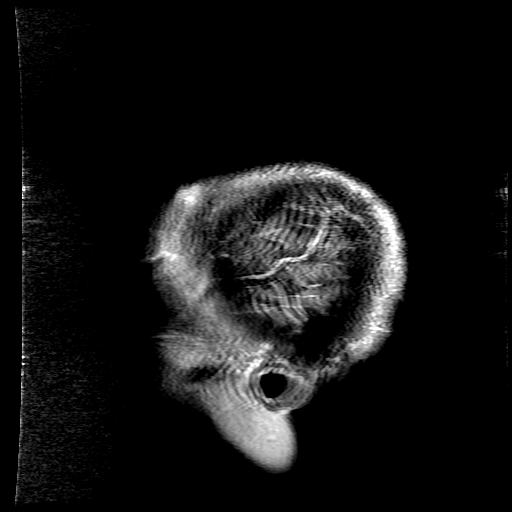
[im 24/24]
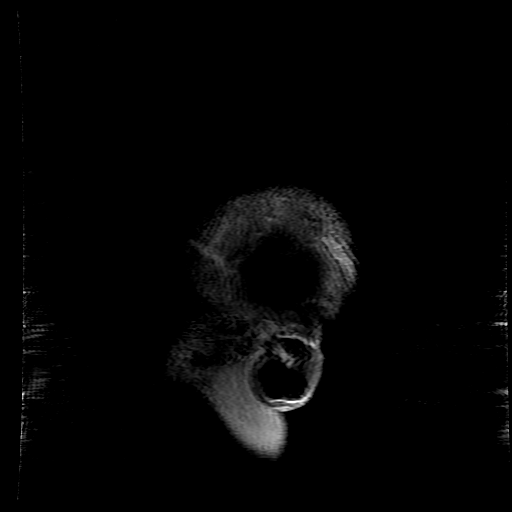

[Series 14: T2 post-contrast · coronal · 5.0mm · 0.45mm/px · 2 of 27 slices shown (2 of 2)]
[im 1/27]
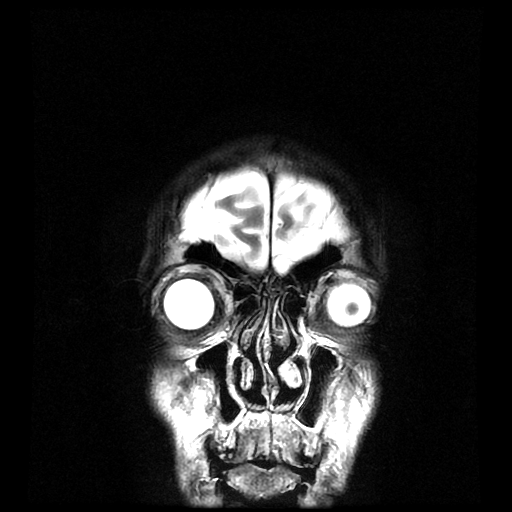
[im 27/27]
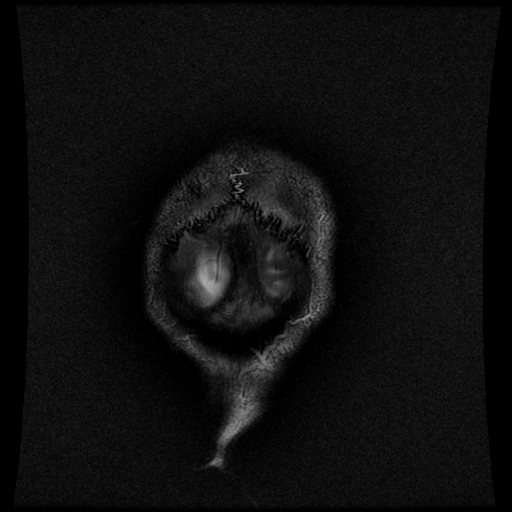

[Series 400: DWI · axial · 3.0mm · 1.09mm/px · z∈[-21,+132]mm · 4 of 52 slices shown (3 of 4)]
[im 1/52]
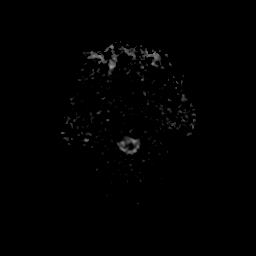
[im 18/52]
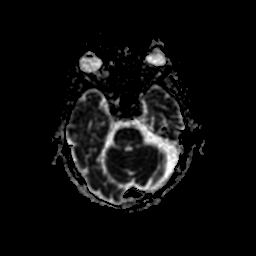
[im 35/52]
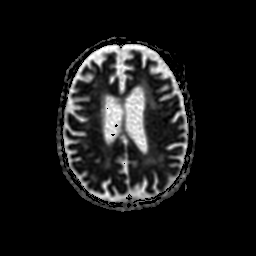
[im 52/52]
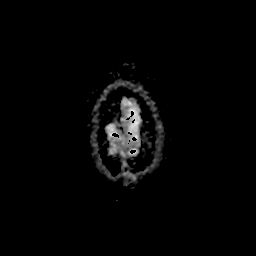

[Series 500: DWI · coronal · 5.0mm · 1.09mm/px · 3 of 35 slices shown (4 of 4)]
[im 1/35]
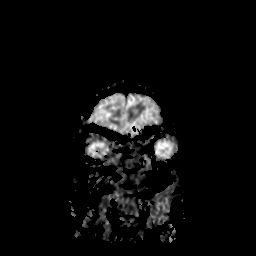
[im 18/35]
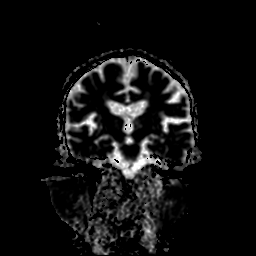
[im 35/35]
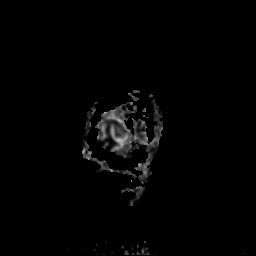

[33 of 48 positions shown; findings below may reference images not displayed]

FINDINGS: Brain: Pronounced generalized brain atrophy. Chronic small-vessel
ischemic changes affecting the pons and throughout the cerebral
hemispheric deep and subcortical white matter. Old infarction in the
left basal ganglia. Old right occipital cortical and subcortical
infarction. Old infarction right external capsule with hemosiderin
deposition. No evidence of primary or metastatic mass lesion, recent
hemorrhage, obstructive hydrocephalus or extra-axial collection.

On FLAIR imaging only, some of the gyri a appear to show increased
FLAIR intensity. These are most notable on the frontal regions but
are present scattered about. I have discussed this with other neuro
radiology specialists in we think that this could be artifactual.
However, the differential diagnosis does include true pathologies
including resolving posterior reversible encephalopathy, viral or
prion disease, postictal change and post chemotherapy change. If
there is concern about active CNS pathology, re- scanning in 4-6
weeks, preferentially on a 3 tesla magnet, could help sort this out.

Vascular: Major vessels at the base of the brain show flow.

Skull and upper cervical spine: Negative

Sinuses/Orbits: Mild paranasal sinus mucosal thickening. No advanced
disease. Orbits negative.

Other: None significant
IMPRESSION: No evidence of metastatic disease or recent infarction.

Advanced generalized brain atrophy with moderate to marked chronic
small-vessel ischemic changes throughout.

FLAIR imaging appears to show scattered gyri showing FLAIR
hyperintensity. These abnormalities are not confirmed with
restricted diffusion or contrast enhancement. Therefore, we are not
certain that this is not artifactual. The differential diagnosis
does include true pathology such as posterior reversible
encephalopathy, viral or prion disease, postictal change in post
chemotherapy change. If there is concern about active CNS pathology,
re- scanning in 4-6 weeks could be useful.

## 2019-01-28 ENCOUNTER — Ambulatory Visit: Payer: Medicare Other | Admitting: Neurology
